# Patient Record
Sex: Male | Born: 1940 | Race: White | Hispanic: No | Marital: Married | Smoking: Never smoker
Health system: Southern US, Community
[De-identification: ages and names within clinical notes are randomized; demographics above are authoritative.]

## PROBLEM LIST (undated history)

## (undated) DIAGNOSIS — I10 Essential (primary) hypertension: Secondary | ICD-10-CM

## (undated) DIAGNOSIS — N433 Hydrocele, unspecified: Secondary | ICD-10-CM

## (undated) DIAGNOSIS — I200 Unstable angina: Secondary | ICD-10-CM

## (undated) DIAGNOSIS — R739 Hyperglycemia, unspecified: Secondary | ICD-10-CM

## (undated) DIAGNOSIS — M109 Gout, unspecified: Secondary | ICD-10-CM

## (undated) DIAGNOSIS — F419 Anxiety disorder, unspecified: Secondary | ICD-10-CM

## (undated) DIAGNOSIS — K2270 Barrett's esophagus without dysplasia: Secondary | ICD-10-CM

## (undated) DIAGNOSIS — K449 Diaphragmatic hernia without obstruction or gangrene: Secondary | ICD-10-CM

## (undated) DIAGNOSIS — R600 Localized edema: Secondary | ICD-10-CM

## (undated) DIAGNOSIS — N529 Male erectile dysfunction, unspecified: Secondary | ICD-10-CM

## (undated) DIAGNOSIS — Z9289 Personal history of other medical treatment: Secondary | ICD-10-CM

## (undated) DIAGNOSIS — K219 Gastro-esophageal reflux disease without esophagitis: Secondary | ICD-10-CM

## (undated) DIAGNOSIS — Z888 Allergy status to other drugs, medicaments and biological substances status: Secondary | ICD-10-CM

## (undated) DIAGNOSIS — Z951 Presence of aortocoronary bypass graft: Secondary | ICD-10-CM

## (undated) DIAGNOSIS — S82892A Other fracture of left lower leg, initial encounter for closed fracture: Secondary | ICD-10-CM

## (undated) DIAGNOSIS — E785 Hyperlipidemia, unspecified: Secondary | ICD-10-CM

## (undated) DIAGNOSIS — I219 Acute myocardial infarction, unspecified: Secondary | ICD-10-CM

## (undated) DIAGNOSIS — I2510 Atherosclerotic heart disease of native coronary artery without angina pectoris: Secondary | ICD-10-CM

## (undated) DIAGNOSIS — I639 Cerebral infarction, unspecified: Secondary | ICD-10-CM

## (undated) DIAGNOSIS — G4733 Obstructive sleep apnea (adult) (pediatric): Secondary | ICD-10-CM

## (undated) DIAGNOSIS — K317 Polyp of stomach and duodenum: Secondary | ICD-10-CM

## (undated) HISTORY — PX: CORONARY ANGIOPLASTY: SHX604

## (undated) HISTORY — DX: Localized edema: R60.0

## (undated) HISTORY — DX: Gout, unspecified: M10.9

## (undated) HISTORY — DX: Male erectile dysfunction, unspecified: N52.9

## (undated) HISTORY — PX: TONSILLECTOMY: SUR1361

## (undated) HISTORY — DX: Polyp of stomach and duodenum: K31.7

## (undated) HISTORY — DX: Obstructive sleep apnea (adult) (pediatric): G47.33

## (undated) HISTORY — DX: Personal history of other medical treatment: Z92.89

## (undated) HISTORY — PX: BACK SURGERY: SHX140

## (undated) HISTORY — DX: Hydrocele, unspecified: N43.3

## (undated) HISTORY — DX: Diaphragmatic hernia without obstruction or gangrene: K44.9

## (undated) HISTORY — PX: COLONOSCOPY: SHX174

---

## 1997-10-10 HISTORY — PX: OTHER SURGICAL HISTORY: SHX169

## 2002-12-05 ENCOUNTER — Ambulatory Visit (HOSPITAL_COMMUNITY): Admission: RE | Admit: 2002-12-05 | Discharge: 2002-12-05 | Payer: Self-pay | Admitting: Gastroenterology

## 2004-10-27 ENCOUNTER — Ambulatory Visit (HOSPITAL_COMMUNITY): Admission: RE | Admit: 2004-10-27 | Discharge: 2004-10-27 | Payer: Self-pay | Admitting: Gastroenterology

## 2007-10-11 DIAGNOSIS — S82892A Other fracture of left lower leg, initial encounter for closed fracture: Secondary | ICD-10-CM

## 2007-10-11 HISTORY — DX: Other fracture of left lower leg, initial encounter for closed fracture: S82.892A

## 2013-07-13 ENCOUNTER — Encounter (HOSPITAL_COMMUNITY)
Admission: EM | Disposition: A | Discharge status: Home / Self Care | Payer: Medicare Other | Source: Home / Self Care | Attending: Interventional Cardiology

## 2013-07-13 ENCOUNTER — Inpatient Hospital Stay (HOSPITAL_COMMUNITY)
Admission: EM | Admit: 2013-07-13 | Discharge: 2013-07-16 | DRG: 247 | Disposition: A | Discharge status: Home / Self Care | Payer: Medicare Other | Attending: Interventional Cardiology | Admitting: Interventional Cardiology

## 2013-07-13 ENCOUNTER — Emergency Department (HOSPITAL_COMMUNITY): Payer: Medicare Other

## 2013-07-13 ENCOUNTER — Encounter (HOSPITAL_COMMUNITY): Payer: Self-pay | Admitting: *Deleted

## 2013-07-13 DIAGNOSIS — R7309 Other abnormal glucose: Secondary | ICD-10-CM | POA: Diagnosis present

## 2013-07-13 DIAGNOSIS — I2119 ST elevation (STEMI) myocardial infarction involving other coronary artery of inferior wall: Principal | ICD-10-CM | POA: Diagnosis present

## 2013-07-13 DIAGNOSIS — I440 Atrioventricular block, first degree: Secondary | ICD-10-CM | POA: Diagnosis present

## 2013-07-13 DIAGNOSIS — I252 Old myocardial infarction: Secondary | ICD-10-CM | POA: Diagnosis present

## 2013-07-13 DIAGNOSIS — E785 Hyperlipidemia, unspecified: Secondary | ICD-10-CM

## 2013-07-13 DIAGNOSIS — Z955 Presence of coronary angioplasty implant and graft: Secondary | ICD-10-CM

## 2013-07-13 DIAGNOSIS — I2582 Chronic total occlusion of coronary artery: Secondary | ICD-10-CM | POA: Diagnosis present

## 2013-07-13 DIAGNOSIS — R739 Hyperglycemia, unspecified: Secondary | ICD-10-CM

## 2013-07-13 DIAGNOSIS — I2510 Atherosclerotic heart disease of native coronary artery without angina pectoris: Secondary | ICD-10-CM

## 2013-07-13 DIAGNOSIS — Z8249 Family history of ischemic heart disease and other diseases of the circulatory system: Secondary | ICD-10-CM

## 2013-07-13 DIAGNOSIS — K2270 Barrett's esophagus without dysplasia: Secondary | ICD-10-CM | POA: Diagnosis present

## 2013-07-13 DIAGNOSIS — I10 Essential (primary) hypertension: Secondary | ICD-10-CM | POA: Diagnosis present

## 2013-07-13 DIAGNOSIS — Z79899 Other long term (current) drug therapy: Secondary | ICD-10-CM

## 2013-07-13 DIAGNOSIS — Z7902 Long term (current) use of antithrombotics/antiplatelets: Secondary | ICD-10-CM

## 2013-07-13 DIAGNOSIS — I214 Non-ST elevation (NSTEMI) myocardial infarction: Secondary | ICD-10-CM

## 2013-07-13 DIAGNOSIS — K219 Gastro-esophageal reflux disease without esophagitis: Secondary | ICD-10-CM | POA: Diagnosis present

## 2013-07-13 DIAGNOSIS — Z886 Allergy status to analgesic agent status: Secondary | ICD-10-CM

## 2013-07-13 DIAGNOSIS — Z888 Allergy status to other drugs, medicaments and biological substances status: Secondary | ICD-10-CM

## 2013-07-13 HISTORY — PX: PERCUTANEOUS CORONARY STENT INTERVENTION (PCI-S): SHX5485

## 2013-07-13 HISTORY — DX: Gastro-esophageal reflux disease without esophagitis: K21.9

## 2013-07-13 HISTORY — PX: LEFT HEART CATHETERIZATION WITH CORONARY ANGIOGRAM: SHX5451

## 2013-07-13 HISTORY — DX: Hyperlipidemia, unspecified: E78.5

## 2013-07-13 HISTORY — DX: Atherosclerotic heart disease of native coronary artery without angina pectoris: I25.10

## 2013-07-13 HISTORY — DX: Essential (primary) hypertension: I10

## 2013-07-13 HISTORY — DX: Barrett's esophagus without dysplasia: K22.70

## 2013-07-13 HISTORY — DX: Old myocardial infarction: I25.2

## 2013-07-13 HISTORY — DX: Hyperglycemia, unspecified: R73.9

## 2013-07-13 HISTORY — DX: Allergy status to other drugs, medicaments and biological substances: Z88.8

## 2013-07-13 LAB — CBC
HCT: 41.6 % (ref 39.0–52.0)
Hemoglobin: 15.2 g/dL (ref 13.0–17.0)
MCH: 34.8 pg — ABNORMAL HIGH (ref 26.0–34.0)
MCHC: 36.5 g/dL — ABNORMAL HIGH (ref 30.0–36.0)
MCV: 95.2 fL (ref 78.0–100.0)
RDW: 12.7 % (ref 11.5–15.5)

## 2013-07-13 LAB — BASIC METABOLIC PANEL
BUN: 12 mg/dL (ref 6–23)
CO2: 27 mEq/L (ref 19–32)
Calcium: 9.1 mg/dL (ref 8.4–10.5)
GFR calc non Af Amer: 88 mL/min — ABNORMAL LOW (ref >90)
Glucose, Bld: 143 mg/dL — ABNORMAL HIGH (ref 70–99)
Sodium: 138 mEq/L (ref 135–145)

## 2013-07-13 LAB — APTT: aPTT: 29 seconds (ref 24–37)

## 2013-07-13 LAB — TROPONIN I
Troponin I: 0.86 ng/mL (ref <0.30)
Troponin I: 11.8 ng/mL (ref <0.30)

## 2013-07-13 LAB — MRSA PCR SCREENING: MRSA by PCR: NEGATIVE

## 2013-07-13 LAB — PROTIME-INR: Prothrombin Time: 13.1 seconds (ref 11.6–15.2)

## 2013-07-13 LAB — GLUCOSE, CAPILLARY: Glucose-Capillary: 117 mg/dL — ABNORMAL HIGH (ref 70–99)

## 2013-07-13 SURGERY — LEFT HEART CATHETERIZATION WITH CORONARY ANGIOGRAM
Anesthesia: LOCAL

## 2013-07-13 MED ORDER — TICAGRELOR 90 MG PO TABS
90.0000 mg | ORAL_TABLET | Freq: Two times a day (BID) | ORAL | Status: DC
  Administered 2013-07-14 – 2013-07-16 (×5): 90 mg via ORAL
  Filled 2013-07-13 (×7): qty 1

## 2013-07-13 MED ORDER — QUINAPRIL HCL 10 MG PO TABS
40.0000 mg | ORAL_TABLET | Freq: Every evening | ORAL | Status: DC
Start: 2013-07-13 — End: 2013-07-16
  Administered 2013-07-14 – 2013-07-15 (×2): 40 mg via ORAL
  Filled 2013-07-13 (×5): qty 4

## 2013-07-13 MED ORDER — BIVALIRUDIN 250 MG IV SOLR
0.2500 mg/kg/h | INTRAVENOUS | Status: DC
  Administered 2013-07-13: 0.25 mg/kg/h via INTRAVENOUS
  Filled 2013-07-13: qty 250

## 2013-07-13 MED ORDER — NITROGLYCERIN 0.4 MG SL SUBL
0.4000 mg | SUBLINGUAL_TABLET | SUBLINGUAL | Status: DC | PRN
  Administered 2013-07-13: 0.4 mg via SUBLINGUAL

## 2013-07-13 MED ORDER — NITROGLYCERIN 0.4 MG SL SUBL
0.4000 mg | SUBLINGUAL_TABLET | SUBLINGUAL | Status: AC | PRN
Start: 2013-07-13 — End: 2013-07-13
  Administered 2013-07-13 (×3): 0.4 mg via SUBLINGUAL
  Filled 2013-07-13: qty 25

## 2013-07-13 MED ORDER — ATORVASTATIN CALCIUM 80 MG PO TABS
80.0000 mg | ORAL_TABLET | Freq: Every day | ORAL | Status: DC
  Administered 2013-07-14 – 2013-07-15 (×2): 80 mg via ORAL
  Filled 2013-07-13 (×4): qty 1

## 2013-07-13 MED ORDER — SODIUM CHLORIDE 0.9 % IV SOLN
0.2500 mg/kg/h | INTRAVENOUS | Status: DC
  Filled 2013-07-13: qty 250

## 2013-07-13 MED ORDER — ATORVASTATIN CALCIUM 80 MG PO TABS
80.0000 mg | ORAL_TABLET | Freq: Every day | ORAL | Status: DC
  Filled 2013-07-13 (×4): qty 1

## 2013-07-13 MED ORDER — FENTANYL CITRATE 0.05 MG/ML IJ SOLN
INTRAMUSCULAR | Status: AC
  Filled 2013-07-13: qty 2

## 2013-07-13 MED ORDER — CLOPIDOGREL BISULFATE 300 MG PO TABS: 300.0000 mg | ORAL_TABLET | Freq: Once | ORAL | Status: DC

## 2013-07-13 MED ORDER — HEPARIN SODIUM (PORCINE) 5000 UNIT/ML IJ SOLN
5000.0000 [IU] | Freq: Three times a day (TID) | INTRAMUSCULAR | Status: DC
  Filled 2013-07-13: qty 1

## 2013-07-13 MED ORDER — HEPARIN (PORCINE) IN NACL 100-0.45 UNIT/ML-% IJ SOLN
1000.0000 [IU]/h | INTRAMUSCULAR | Status: DC
  Administered 2013-07-13: 1000 [IU]/h via INTRAVENOUS
  Filled 2013-07-13: qty 250

## 2013-07-13 MED ORDER — NITROGLYCERIN 0.2 MG/ML ON CALL CATH LAB
INTRAVENOUS | Status: AC
  Filled 2013-07-13: qty 1

## 2013-07-13 MED ORDER — ACETAMINOPHEN 325 MG PO TABS: 650.0000 mg | ORAL_TABLET | ORAL | Status: DC | PRN

## 2013-07-13 MED ORDER — BIVALIRUDIN 250 MG IV SOLR
INTRAVENOUS | Status: AC
  Filled 2013-07-13: qty 250

## 2013-07-13 MED ORDER — ONDANSETRON HCL 4 MG/2ML IJ SOLN: 4.0000 mg | Freq: Four times a day (QID) | INTRAMUSCULAR | Status: DC | PRN

## 2013-07-13 MED ORDER — OXYCODONE-ACETAMINOPHEN 5-325 MG PO TABS: 1.0000 | ORAL_TABLET | ORAL | Status: DC | PRN

## 2013-07-13 MED ORDER — TICAGRELOR 90 MG PO TABS
ORAL_TABLET | ORAL | Status: AC
  Filled 2013-07-13: qty 2

## 2013-07-13 MED ORDER — PNEUMOCOCCAL VAC POLYVALENT 25 MCG/0.5ML IJ INJ
0.5000 mL | INJECTION | INTRAMUSCULAR | Status: DC
  Filled 2013-07-13: qty 0.5

## 2013-07-13 MED ORDER — CLOPIDOGREL BISULFATE 300 MG PO TABS
300.0000 mg | ORAL_TABLET | Freq: Once | ORAL | Status: AC
  Administered 2013-07-13: 300 mg via ORAL
  Filled 2013-07-13: qty 1

## 2013-07-13 MED ORDER — HEPARIN (PORCINE) IN NACL 2-0.9 UNIT/ML-% IJ SOLN
INTRAMUSCULAR | Status: AC
  Filled 2013-07-13: qty 1000

## 2013-07-13 MED ORDER — ALPRAZOLAM 0.25 MG PO TABS
0.2500 mg | ORAL_TABLET | Freq: Three times a day (TID) | ORAL | Status: DC | PRN
  Administered 2013-07-13 – 2013-07-15 (×3): 0.25 mg via ORAL
  Filled 2013-07-13 (×3): qty 1

## 2013-07-13 MED ORDER — SODIUM CHLORIDE 0.9 % IV BOLUS (SEPSIS)
1000.0000 mL | Freq: Once | INTRAVENOUS | Status: AC
  Administered 2013-07-13: 1000 mL via INTRAVENOUS

## 2013-07-13 MED ORDER — MIDAZOLAM HCL 2 MG/2ML IJ SOLN
INTRAMUSCULAR | Status: AC
  Filled 2013-07-13: qty 2

## 2013-07-13 MED ORDER — LIDOCAINE HCL (PF) 1 % IJ SOLN
INTRAMUSCULAR | Status: AC
  Filled 2013-07-13: qty 30

## 2013-07-13 MED ORDER — HEPARIN SODIUM (PORCINE) 5000 UNIT/ML IJ SOLN
4000.0000 [IU] | Freq: Once | INTRAMUSCULAR | Status: AC
  Administered 2013-07-13: 4000 [IU] via INTRAVENOUS
  Filled 2013-07-13: qty 1

## 2013-07-13 MED ORDER — CLOPIDOGREL BISULFATE 75 MG PO TABS: 75.0000 mg | ORAL_TABLET | Freq: Every day | ORAL | Status: DC

## 2013-07-13 MED ORDER — SODIUM CHLORIDE 0.9 % IV SOLN: 0.2500 mg/kg/h | INTRAVENOUS | Status: AC

## 2013-07-13 MED ORDER — METOPROLOL TARTRATE 25 MG PO TABS
25.0000 mg | ORAL_TABLET | Freq: Two times a day (BID) | ORAL | Status: DC
  Administered 2013-07-13 – 2013-07-16 (×6): 25 mg via ORAL
  Filled 2013-07-13 (×7): qty 1

## 2013-07-13 MED ORDER — INFLUENZA VAC SPLIT QUAD 0.5 ML IM SUSP
0.5000 mL | INTRAMUSCULAR | Status: DC
  Filled 2013-07-13: qty 0.5

## 2013-07-13 MED ORDER — PANTOPRAZOLE SODIUM 40 MG PO TBEC
40.0000 mg | DELAYED_RELEASE_TABLET | Freq: Every day | ORAL | Status: DC
  Administered 2013-07-14 – 2013-07-16 (×3): 40 mg via ORAL
  Filled 2013-07-13 (×4): qty 1

## 2013-07-13 MED ORDER — SODIUM CHLORIDE 0.9 % IV SOLN
1.0000 mL/kg/h | INTRAVENOUS | Status: DC
  Administered 2013-07-13: 1 mL/kg/h via INTRAVENOUS

## 2013-07-13 NOTE — H&P (Signed)
Physician History and Physical      Patient ID: Matthew Liu MRN: [id number] DOB/AGE: Aug 13, 1941 72 y.o. Admit date: 07/13/2013  Primary Care Physician:  Primary Cardiologist New  HPI: Matthew Liu is a 72 year old white male who presents to the emergency department today with complaints of chest pain. He has a history of hypertension. Today at approximately 12 noon he developed substernal chest pain while drinking a cup of coffee at [employer]. He states the pain started in his arms bilaterally. It radiated up his arms into his shoulders and chest and jaw. He denied any diaphoresis, nausea, or shortness of breath. The pain was worse when he tries to exert himself. He did somewhat waxed and waned throughout the afternoon. He tried walking to his daughter's house and when he got there he was exhausted and continued to have chest discomfort. He then presented to the emergency room. In the emergency room he was treated with sublingual nitroglycerin x3 with partial relief of his chest pain going from 8/10-3/10. He has no prior cardiac history. 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   Review of systems complete and found to be negative unless listed above  Past Medical History  Diagnosis Date  . Hypertension     Family History  Problem Relation Age of Onset  . Hypertension Mother     History   Social History  . Marital Status: Married    Spouse Name: N/A    Number of Children: 2  . Years of Education: N/A   Occupational History  . Department of Corporate investment banker    Social History Main Topics  . Smoking status: Never Smoker   . Smokeless tobacco: Not on file  . Alcohol Use: Yes  . Drug Use: No  . Sexual Activity: Not on file   Other Topics Concern  . Not on file   Social History Narrative  . No narrative on file    Past Surgical History  Procedure Laterality Date  . Left ankle surgery       Prescriptions prior to admission  Medication Sig  Dispense Refill  . Multiple Vitamins-Minerals (SENIOR MULTIVITAMIN PLUS PO) Take 1 tablet by mouth daily.      [name] omeprazole (PRILOSEC) 40 MG capsule Take 40 mg by mouth every morning.      . quinapril (ACCUPRIL) 40 MG tablet Take 40 mg by mouth every evening.         Physical Exam: Blood pressure 143/80, pulse 100, temperature 98.3 F (36.8 C), temperature source Oral, resp. rate 28, weight 165 lb (74.844 kg), SpO2 100.00%.  He is a pleasant white male in mild distress. HEENT: Normocephalic, atraumatic. Pupils equal round and reactive to light accommodation. Extraocular movements are full. Oropharynx is clear. Neck is without jugular venous distention, adenopathy, thyromegaly, or bruits. Lungs: Clear Cardiovascular: Regular rate and rhythm. Normal S1 and S2. No gallop, murmur, or click. Abdomen: Soft and nontender. No plasma megaly. Bowel sounds are positive. Extremities: No cyanosis or edema. Pulses are 2+ and symmetric throughout. Skin: Warm and dry. Neuro: Alert and oriented x3. Cranial nerves II through XII are intact.  Labs:   Lab Results  Component Value Date   WBC 7.2 07/13/2013   HGB 15.2 07/13/2013   HCT 41.6 07/13/2013   MCV 95.2 07/13/2013   PLT 234 07/13/2013     Recent Labs Lab 07/13/13 1556  NA 138  K 4.1  CL 101  CO2 27  BUN 12  CREATININE  0.77  CALCIUM 9.1  GLUCOSE 143*   Lab Results  Component Value Date   TROPONINI 0.86* 07/13/2013    No results found for this basename: CHOL   No results found for this basename: HDL   No results found for this basename: LDLCALC   No results found for this basename: TRIG   No results found for this basename: CHOLHDL   No results found for this basename: LDLDIRECT      Radiology:PORTABLE CHEST - 1 VIEW  COMPARISON: None.  FINDINGS: The heart size and mediastinal contours are within normal limits. Both lungs are clear. The visualized skeletal structures are unremarkable.  IMPRESSION: No active  disease.   Electronically Signed By: Irish Lack M.D. On: 07/13/2013 16:44   EKG: Initial ECG at 1550 shows normal sinus rhythm with 1 mm of ST elevation in lead 2, 3, and aVF. There is a Q wave in lead 3. Repeat ECG at 1700 is unchanged.  ASSESSMENT AND PLAN:  1. Acute inferior STEMI. Patient has ongoing chest pain. Given his aspirin allergy he was treated with Plavix 300 mg orally. He is given a bolus of heparin. STEMI protocol is activated. Recommend emergent cardiac catheterization with possible PCI. 2. Hypertension. 3. GERD. 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  SignedTheron Arista [facility] 07/13/2013, 6:18 PM

## 2013-07-13 NOTE — ED Notes (Signed)
Pt is here with mid chest pain , arm pain, neck, and back pain for a couple of days.  Pt states pain increases with exertion.  No sob.nausea

## 2013-07-13 NOTE — Progress Notes (Signed)
Chaplain was paged to ed for a code stemi.  Pt seemed alert and responsive when chaplain entered the room.  Chaplain prayed with the family and was informed that chaplain services would not be needed from that point forward.  Chaplain ended the visit and will be available if needed or requested.    07/13/13 1715  Clinical Encounter Type  Visited With Patient and family together  Visit Type Initial;Spiritual support;ED  Referral From Nurse  Consult/Referral To Chaplain  Spiritual Encounters  Spiritual Needs Emotional;Prayer  Stress Factors  Patient Stress Factors None identified  Family Stress Factors None identified  [facility]  Sheral Apley   [phone]

## 2013-07-13 NOTE — ED Provider Notes (Signed)
CSN: [id number]     Arrival date & time 07/13/13  1548 History   First MD Initiated Contact with Patient 07/13/13 1554     Chief Complaint  Patient presents with  . Chest Pain   (Consider location/radiation/quality/duration/timing/severity/associated sxs/prior Treatment) Patient is a 72 y.o. male presenting with chest pain. The history is provided by the patient.  Chest Pain Pain location:  Substernal area Pain quality: pressure   Pain radiates to:  L jaw, R jaw, L shoulder, R arm and L arm Pain severity:  Severe Onset quality:  Gradual Duration:  3 days Timing:  Intermittent Progression:  Waxing and waning Chronicity:  New Relieved by:  Rest Worsened by:  Exertion Ineffective treatments:  Rest Associated symptoms: shortness of breath   Associated symptoms: no abdominal pain, no back pain, no cough, no fever, no heartburn, no nausea, no numbness and not vomiting   Risk factors: hypertension   Risk factors: no smoking     Past Medical History  Diagnosis Date  . Hypertension    Past Surgical History  Procedure Laterality Date  . Left ankle surgery     Family History  Problem Relation Age of Onset  . Hypertension Mother    History  Substance Use Topics  . Smoking status: Never Smoker   . Smokeless tobacco: Not on file  . Alcohol Use: Yes    Review of Systems  Constitutional: Negative for fever and activity change.  Respiratory: Positive for chest tightness and shortness of breath. Negative for cough.   Cardiovascular: Positive for chest pain.  Gastrointestinal: Negative for heartburn, nausea, vomiting, abdominal pain, diarrhea and constipation.  Musculoskeletal: Negative for myalgias, back pain and arthralgias.  Skin: Negative for color change, pallor, rash and wound.  Neurological: Negative for numbness.  All other systems reviewed and are negative.    Allergies  Aspirin and Quinine derivatives  Home Medications   No current outpatient prescriptions on  file. BP 135/71  Pulse 52  Temp(Src) 98.1 F (36.7 C) (Oral)  Resp 23  Ht 5\' 9"  (1.753 m)  Wt 168 lb 6.9 oz (76.4 kg)  BMI 24.86 kg/m2  SpO2 99% Physical Exam  Nursing note and vitals reviewed. Constitutional: He appears well-developed and well-nourished. He appears distressed (appears in obvious discomfort).  HENT:  Head: Normocephalic.  Mouth/Throat: No oropharyngeal exudate.  Eyes: Conjunctivae and EOM are normal. Pupils are equal, round, and reactive to light.  Neck: Normal range of motion. Neck supple.  Cardiovascular: Regular rhythm and normal heart sounds.  Tachycardia present.  Exam reveals no gallop and no friction rub.   No murmur heard. Pulmonary/Chest: Effort normal and breath sounds normal. No respiratory distress. He has no wheezes. He has no rales.  Abdominal: Soft. He exhibits no distension. There is no tenderness.  Musculoskeletal: Normal range of motion. He exhibits no edema and no tenderness.  Lymphadenopathy:    He has no cervical adenopathy.  Skin: Skin is warm and dry. No rash noted. He is not diaphoretic.  Psychiatric: He has a normal mood and affect. His behavior is normal. Judgment and thought content normal.    ED Course  Procedures (including critical care time) Labs Review Labs Reviewed  CBC - Abnormal; Notable for the following:    MCH 34.8 (*)    MCHC 36.5 (*)    All other components within normal limits  BASIC METABOLIC PANEL - Abnormal; Notable for the following:    Glucose, Bld 143 (*)    GFR calc non Af  Amer 88 (*)    All other components within normal limits  TROPONIN I - Abnormal; Notable for the following:    Troponin I 0.86 (*)    All other components within normal limits  TROPONIN I - Abnormal; Notable for the following:    Troponin I 11.80 (*)    All other components within normal limits  GLUCOSE, CAPILLARY - Abnormal; Notable for the following:    Glucose-Capillary 117 (*)    All other components within normal limits  MRSA PCR  SCREENING  PROTIME-INR  APTT  CBC  BASIC METABOLIC PANEL  TROPONIN I  TROPONIN I  HEMOGLOBIN A1C  TSH  LIPID PANEL   Imaging Review Dg Chest Portable 1 View  07/13/2013   CLINICAL DATA:  Chest pain.  EXAM: PORTABLE CHEST - 1 VIEW  COMPARISON:  None.  FINDINGS: The heart size and mediastinal contours are within normal limits. Both lungs are clear. The visualized skeletal structures are unremarkable.  IMPRESSION: No active disease.   Electronically Signed   By: Irish Lack M.D.   On: 07/13/2013 16:44    MDM   1. NSTEMI (non-ST elevated myocardial infarction)   2. ST elevation myocardial infarction (STEMI) of inferior wall     The patient is a 72 year old male with a history of hypertension who presents with chest pain. The patient has had 3 or 4 anginal episodes over the last 2-3 days. Most of these episodes occur with exertion, however he has also noted pain in his arms, neck, shortness of breath. The patient has a history of hypertension he is managed on ACE inhibitor. He presents today due to persistent chest pain has been constant for the last 2-3 hours. He rates it a 7/10 on the pain scale. Describes it as a substernal pressure. Arrival he is hypertensive to 190s systolic.   Date: 07/13/2013  Rate: 105  Rhythm: sinus tachycardia  QRS Axis: normal  Intervals: PR prolonged  ST/T Wave abnormalities: nonspecific ST changes  Conduction Disutrbances:first-degree A-V block   Narrative Interpretation:   Old EKG Reviewed: none available  Concern in this high-risk patient for ACS. Hypertensive emergency also on differential. EKG obtained in triage shows nonspecific ST changes as documented above. Initial i-STAT troponin returned elevated at 0.39. Patient meets criteria for an non-ST elevation MI. He has an aspirin allergy which she describes as anaphylaxis, so we'll give 300 of Plavix for antiplatelet if cardiology is in agreement. We'll also give sublingual nitroglycerin followed by  nitroglycerin infusion should chest pain not resolved. Chest x-ray, formal troponin, basic labs sent.  CXR shows no consolidation, effusion, pneumothorax, or widened mediastinum. Pain improved with sublingual nitro but pressures dropped from 190s down to 100 systolic. Will give fluid bolus and more nitro to get chest pain free. Discussed with Cards and will give plavix due to Asprin allergy as well as heparin bolus and infusion.  Repeat EKG shows resolving non-diagnostic ST changes in III and aVf. Cardiology wants to take to cath lab emergently.   Date: 07/13/2013  Rate: 86  Rhythm: normal sinus rhythm  QRS Axis: normal  Intervals: PR prolonged  ST/T Wave abnormalities: nonspecific ST changes  Conduction Disutrbances:first-degree A-V block   Narrative Interpretation:   Old EKG Reviewed: changes noted, non-diagnostic ST elevations resolving  Patient was discussed with my attending, Dr. Redgie Grayer.  Dorna Leitz, MD 07/14/13 Marlyne Beards

## 2013-07-13 NOTE — Progress Notes (Signed)
ANTICOAGULATION CONSULT NOTE - Initial Consult  Pharmacy Consult for Heparin  Indication: chest pain/ACS  Allergies  Allergen Reactions  . Aspirin Hives    "Shortness of Breath" can't breathe  . Quinine Derivatives Hives    "look like he has the measels"    Patient Measurements: Ht: 67ft 9.5in Weight: 165 lb (74.844 kg).  Vital Signs: Temp: 98.3 F (36.8 C) (10/04 1646) Temp src: Oral (10/04 1646) BP: 139/70 mmHg (10/04 1700) Pulse Rate: 95 (10/04 1700)  Labs:  Recent Labs  07/13/13 1556  HGB 15.2  HCT 41.6  PLT 234  CREATININE 0.77    CrCl is unknown because there is no height on file for the current visit.   Medical History: Past Medical History  Diagnosis Date  . Hypertension    Assessment: 72 y/o M here with CP to start heparin. Pt already got 4000 units heparin BOLUS and 300 mg Plavix (ASA allergic). CBC good, renal function good, no bleeding problems per pt.   Goal of Therapy:  Heparin level 0.3-0.7 units/ml Monitor platelets by anticoagulation protocol: Yes   Plan:  -Heparin 4000 units bolus already given -Start heparin drip at 1000 units/hr -8 hour HL at 0200 -Daily CBC/HL -Monitor for bleeding -F/U aPTT, INR  Thank you for allowing me to take part in this patient's care,  Abran Duke, PharmD Clinical Pharmacist Phone: [phone] Pager: [phone] 07/13/2013 5:23 PM

## 2013-07-13 NOTE — CV Procedure (Addendum)
Emergency Cardiac Catheterization with Coronary Angiography and PCI Report  Matthew Liu  72 y.o.  male [birth year]  Procedure Date: 07/13/2013 Referring Physician: [facility] ED Primary Cardiologist:: HWBSmith, III, MD   PROCEDURE:  Left heart catheterization with selective coronary angiography(performermed by Dr. Peter Swaziland), left ventriculogram, and coronary PCI (HWBSmith)  INDICATIONS:  Acute inferolateral ST elevation MI.  The risks, benefits, and details of the procedure were explained to the patient.  The patient verbalized understanding and wanted to proceed.  Informed written consent was obtained.  PROCEDURE TECHNIQUE:  Dr. Swaziland started the case and performed. Coronary angiography. After Xylocaine anesthesia a 5 French Slender radial sheath was placed in the right radial artery with a single anterior needle wall stick. Coronary angiography was done using a 5 [place] JR 4 and JL 3.5 cm diagnostic catheters.  Left ventriculography was done using hand injection via the JR 4 guide catheter. Left ventriculography was performed at the completion of the case.  Dr. Swaziland and I briefly discussed the patient's anatomy and felt that the right coronary was likely the culprit. The diagnostic angiograms appeared to suggest a flush occlusion with the aorta and other features compatible with chronic occlusion. Despite this, I decided to attempt recanalization of the right coronary. A 6 [place] JR 4 guide catheter was chosen. Bivalirudin bolus and infusion was started. The patient had been previously loaded with 600 mg of Plavix in the emergency room. He is allergic to aspirin (pulmonary and skin reaction). Luckily, the right coronary proximal segment was heavily calcified. The calcium helped to achieve coaxial alignment. I then probed the occlusion with the BMW wire and with very little difficulty we were able to cross the ostial stenosis and gain access to the distal vessel. We then performed  angioplasty in the proximal and ostial segment initially with a 2.5 mm Trek balloon. This was followed by a 3.0 x 15 mm balloon. Four balloon inflations were performed. I then placed a Pro-water buddy wire distal in the right coronary. We positioned and deployed a Promus Premier 3.5 x 28 mm long stent in the midsegment deployed at 15 atmospheres for 2 inflations. The buddy wire was removed prior to stent deployment. The buddy wire was then re\re positioned distal and a second Promus Premier 3.5 x 28 mm stent was positioned overlapping the previously placed stent and extending just outside the ostium of the right coronary. This stent was also deployed to 15 atmospheres x2 inflations. Postdilatation was then performed in 3 overlapping balloon inflations using a 20 mm long by 3.75 mm Ogilvie Quantum Apex to 14 atmospheres in each location. TIMI grade 3 flow was reestablished. The distal territory is large. A nice distal blush was noted.   CONTRAST:  Total of 235 cc.  COMPLICATIONS:  None.    HEMODYNAMICS:  Aortic pressure was 135/76 mmHg; LV pressure was 135/9 mmHg; LVEDP 15 mm mercury.  There was no gradient between the left ventricle and aorta.    ANGIOGRAPHIC DATA:   The left main coronary artery is relatively short with distal 30% narrowing. Moderate calcification is noted..  The left anterior descending artery is transapical. There is an eccentric 40-50% proximal narrowing..  The left circumflex artery is moderate in size giving to obtuse marginal branches. There is 50% eccentric calcified proximal to ostial narrowing. The mid vessel net origin of the first obtuse marginal contains a Medina 0, with one, one bifurcation stenosis with the continuation of the circumflex being  most severely involved at 99%. The first marginal contains 80% obstruction.  The right coronary artery is heavily calcified. In the ostium and proximal segment and totally occluded at the aorto ostial junction. The distal vessel was  seen to fill by left to right collaterals.  CORONARY PERCUTANEOUS INTERVENTION: The totally occluded right coronary was recanalized with angioplasty followed by stenting with 0% residual stenosis and TIMI 3 flow. Myocardial blush was present, suggesting microcirculatory function.  LEFT VENTRICULOGRAM:  Left ventricular angiogram was done in the 30 RAO projection and revealed inferobasal severe hypokinesis. The ejection fraction is 55%.   IMPRESSIONS:  1. Acute inferior myocardial infarction due to occlusion of the right coronary.  2. Successful PTCA and stenting of a totally occluded ostial stenosis to 0% with TIMI grade 3 flow. The proximal and mid vessel was also severely diseased requiring that a long segment of stent be placed (56 mm overlapping).  3. Aspirin allergy.  4. Severe circumflex obstructive disease, with a mid vessel Medina, 0/1/1 bifurcation lesion.  5. Moderate ostial LAD disease.  6. Inferior regional wall motion abnormality, but overall preserved LV function. LVEF 55%   RECOMMENDATION:  Brilinta 90mg  BID  Bivalirudin 0.25 mg per kilogram per hour infusion x6 hours.  Circumflex PCI at a later date, after we're certain that the patient will not have stent thrombosis on single antiplatelet therapy. The symptoms, however, couldn't force our hand.

## 2013-07-14 DIAGNOSIS — I10 Essential (primary) hypertension: Secondary | ICD-10-CM

## 2013-07-14 DIAGNOSIS — K219 Gastro-esophageal reflux disease without esophagitis: Secondary | ICD-10-CM

## 2013-07-14 DIAGNOSIS — I2119 ST elevation (STEMI) myocardial infarction involving other coronary artery of inferior wall: Secondary | ICD-10-CM

## 2013-07-14 LAB — GLUCOSE, CAPILLARY
Glucose-Capillary: 106 mg/dL — ABNORMAL HIGH (ref 70–99)
Glucose-Capillary: 94 mg/dL (ref 70–99)

## 2013-07-14 LAB — CBC
HCT: 37.6 % — ABNORMAL LOW (ref 39.0–52.0)
Hemoglobin: 13.1 g/dL (ref 13.0–17.0)
MCH: 33.3 pg (ref 26.0–34.0)
MCHC: 34.8 g/dL (ref 30.0–36.0)
MCV: 95.7 fL (ref 78.0–100.0)
Platelets: 191 10*3/uL (ref 150–400)
RBC: 3.93 MIL/uL — ABNORMAL LOW (ref 4.22–5.81)
WBC: 6.4 10*3/uL (ref 4.0–10.5)

## 2013-07-14 LAB — TSH: TSH: 0.573 u[IU]/mL (ref 0.350–4.500)

## 2013-07-14 LAB — BASIC METABOLIC PANEL
BUN: 10 mg/dL (ref 6–23)
CO2: 23 mEq/L (ref 19–32)
Calcium: 8.2 mg/dL — ABNORMAL LOW (ref 8.4–10.5)
Chloride: 105 mEq/L (ref 96–112)
Creatinine, Ser: 0.63 mg/dL (ref 0.50–1.35)
Glucose, Bld: 131 mg/dL — ABNORMAL HIGH (ref 70–99)
Sodium: 139 mEq/L (ref 135–145)

## 2013-07-14 LAB — HEMOGLOBIN A1C: Mean Plasma Glucose: 120 mg/dL — ABNORMAL HIGH (ref <117)

## 2013-07-14 LAB — TROPONIN I
Troponin I: >20 ng/mL (ref <0.30)
Troponin I: 10.86 ng/mL (ref <0.30)

## 2013-07-14 LAB — LIPID PANEL
HDL: 38 mg/dL — ABNORMAL LOW (ref >39)
LDL Cholesterol: 94 mg/dL (ref 0–99)
Triglycerides: 105 mg/dL (ref <150)

## 2013-07-14 LAB — POCT I-STAT TROPONIN I: Troponin i, poc: 0.39 ng/mL (ref 0.00–0.08)

## 2013-07-14 MED ORDER — PNEUMOCOCCAL VAC POLYVALENT 25 MCG/0.5ML IJ INJ: 0.5000 mL | INJECTION | INTRAMUSCULAR | Status: DC | PRN

## 2013-07-14 MED ORDER — INFLUENZA VAC SPLIT QUAD 0.5 ML IM SUSP: 0.5000 mL | INTRAMUSCULAR | Status: DC | PRN

## 2013-07-14 NOTE — ED Provider Notes (Signed)
CRITICAL CARE Performed by: Redgie Grayer, Jerri Glauser   Total critical care time:45  Critical care time was exclusive of separately billable procedures and treating other patients.  Critical care was necessary to treat or prevent imminent or life-threatening deterioration.  Critical care was time spent personally by me on the following activities: development of treatment plan with patient and/or surrogate as well as nursing, discussions with consultants, evaluation of patient's response to treatment, examination of patient, obtaining history from patient or surrogate, ordering and performing treatments and interventions, ordering and review of laboratory studies, ordering and review of radiographic studies, pulse oximetry and re-evaluation of patient's condition.    I saw and evaluated the patient, reviewed the resident's note and I agree with the findings and plan.  I reviewed EKG and agree with findings.    Stat consult to cardiology upon review of EKG and initial i-STAT troponin. Based on patient's history, progressive chest pain, positive troponin and EKG findings decision was made for emergent catheterization. Patient was given Plavix in lieu of aspirin due to his allergy, heparin was given, and the patient was transferred to cath lab in stable condition.   Darlys Gales, MD 07/14/13 [phone]

## 2013-07-14 NOTE — Progress Notes (Signed)
TELEMETRY: Reviewed telemetry pt in NSR: Filed Vitals:   07/14/13 0400 07/14/13 0430 07/14/13 0500 07/14/13 0629  BP: 122/60 106/51 101/54 141/78  Pulse: 51 50 51 57  Temp: 98.3 F (36.8 C)     TempSrc: Oral     Resp: 22 24 23 19   Height:      Weight:      SpO2: 98% 97% 99% 98%    Intake/Output Summary (Last 24 hours) at 07/14/13 0716 Last data filed at 07/14/13 0700  Gross per 24 hour  Intake 1429.9 ml  Output   1025 ml  Net  404.9 ml    SUBJECTIVE Feels well this am. No further chest pain. No SOB.  LABS: Basic Metabolic Panel:  Recent Labs  16/10/96 1556 07/14/13 0215  NA 138 139  K 4.1 4.0  CL 101 105  CO2 27 23  GLUCOSE 143* 131*  BUN 12 10  CREATININE 0.77 0.63  CALCIUM 9.1 8.2*   CBC:  Recent Labs  07/13/13 1556 07/14/13 0215  WBC 7.2 6.4  HGB 15.2 13.1  HCT 41.6 37.6*  MCV 95.2 95.7  PLT 234 191   Cardiac Enzymes:  Recent Labs  07/13/13 1632 07/13/13 2058 07/14/13 0215  TROPONINI 0.86* 11.80* >20.00*   Hemoglobin A1C: No results found for this basename: HGBA1C,  in the last 72 hours Fasting Lipid Panel:  Recent Labs  07/14/13 0215  CHOL 153  HDL 38*  LDLCALC 94  TRIG 045  CHOLHDL 4.0   Thyroid Function Tests: No results found for this basename: TSH, T4TOTAL, FREET3, T3FREE, THYROIDAB,  in the last 72 hours   Radiology/Studies:  Dg Chest Portable 1 View  07/13/2013   CLINICAL DATA:  Chest pain.  EXAM: PORTABLE CHEST - 1 VIEW  COMPARISON:  None.  FINDINGS: The heart size and mediastinal contours are within normal limits. Both lungs are clear. The visualized skeletal structures are unremarkable.  IMPRESSION: No active disease.   Electronically Signed   By: Irish Lack M.D.   On: 07/13/2013 16:44   Ecg: post PCI- resolution of ST elevation inferiorly with Q waves in III and avf. Ecg today is pending.  PHYSICAL EXAM General: Well developed, well nourished, in no acute distress. Head: Normocephalic, atraumatic,  sclera non-icteric, no xanthomas, nares are without discharge. Neck: Negative for carotid bruits. JVD not elevated. Lungs: Clear bilaterally to auscultation without wheezes, rales, or rhonchi. Breathing is unlabored. Heart: RRR S1 S2 without murmurs, rubs, or gallops.  Abdomen: Soft, non-tender, non-distended with normoactive bowel sounds. No hepatomegaly. No rebound/guarding. No obvious abdominal masses. Msk:  Strength and tone appears normal for age. Extremities: No clubbing, cyanosis or edema.  Distal pedal pulses are 2+ and equal bilaterally. No radial hematoma. Neuro: Alert and oriented X 3. Moves all extremities spontaneously. Psych:  Responds to questions appropriately with a normal affect.  ASSESSMENT AND PLAN: 1. Inferior STEMI. S/p DES stents x 2 from ostial to mid RCA. Continue Brilinta only due to ASA allergy. On beta blocker, ACEi, and statin. Advance activity as tolerated. Check Echo. 2. CAD- as noted above. Residual severe disease in OM2. Will treat medically for now. If recurrent pain may require PCI.  3. ASA allergy. 4. HTN 5. Dyslipidemia. 6. GERD  Principal Problem:   ST elevation myocardial infarction (STEMI) of inferior wall Active Problems:   HTN (hypertension)   GERD (gastroesophageal reflux disease)   Barrett's esophagus    Signed, Peter Swaziland MD,FACC 07/14/2013 7:21 AM

## 2013-07-15 LAB — HEPATIC FUNCTION PANEL
AST: 39 U/L — ABNORMAL HIGH (ref 0–37)
Albumin: 3.5 g/dL (ref 3.5–5.2)
Alkaline Phosphatase: 71 U/L (ref 39–117)
Indirect Bilirubin: 0.8 mg/dL (ref 0.3–0.9)
Total Bilirubin: 1 mg/dL (ref 0.3–1.2)
Total Protein: 6.8 g/dL (ref 6.0–8.3)

## 2013-07-15 LAB — POCT ACTIVATED CLOTTING TIME: Activated Clotting Time: 524 seconds

## 2013-07-15 LAB — GLUCOSE, CAPILLARY: Glucose-Capillary: 111 mg/dL — ABNORMAL HIGH (ref 70–99)

## 2013-07-15 NOTE — Progress Notes (Signed)
Patient Name: Matthew Liu Date of Encounter: 07/15/2013    SUBJECTIVE: [name] has no discomfort. He denies chest discomfort, dyspnea, palpitations, and syncope. Medications are not causing side effects. He is allergic to aspirin and is only onBrilinta. He has residual circumflex disease. I have decided to treat this medically until we see how he does on a single antiplatelet agent.  TELEMETRY:  Normal sinus rhythm: Filed Vitals:   07/15/13 0400 07/15/13 0500 07/15/13 0600 07/15/13 0700  BP: 108/49 131/67 112/43 134/98  Pulse:      Temp:  98.9 F (37.2 C)    TempSrc:  Oral    Resp: 20 18 20 18   Height:      Weight:      SpO2:  95%      Intake/Output Summary (Last 24 hours) at 07/15/13 0918 Last data filed at 07/15/13 0500  Gross per 24 hour  Intake      0 ml  Output    500 ml  Net   -500 ml    LABS: Basic Metabolic Panel:  Recent Labs  40/98/11 1556 07/14/13 0215  NA 138 139  K 4.1 4.0  CL 101 105  CO2 27 23  GLUCOSE 143* 131*  BUN 12 10  CREATININE 0.77 0.63  CALCIUM 9.1 8.2*   CBC:  Recent Labs  07/13/13 1556 07/14/13 0215  WBC 7.2 6.4  HGB 15.2 13.1  HCT 41.6 37.6*  MCV 95.2 95.7  PLT 234 191   Cardiac Enzymes:  Recent Labs  07/13/13 2058 07/14/13 0215 07/14/13 0855  TROPONINI 11.80* >20.00* 10.86*   BNP: No components found with this basename: POCBNP,  Hemoglobin A1C:  Recent Labs  07/13/13 2058  HGBA1C 5.8*   Fasting Lipid Panel:  Recent Labs  07/14/13 0215  CHOL 153  HDL 38*  LDLCALC 94  TRIG 914  CHOLHDL 4.0    Radiology/Studies:  No new data  Physical Exam: Blood pressure 134/98, pulse 82, temperature 98.9 F (37.2 C), temperature source Oral, resp. rate 18, height 5\' 9"  (1.753 m), weight 168 lb 6.9 oz (76.4 kg), SpO2 95.00%. Weight change:    Mild JVD is noted.  Chest is clear to auscultation and percussion.  Cardiac exam reveals an S4 gallop.  Extremities no edema.  Right radial cath site is unremarkable    ASSESSMENT:  1. Acute inferior MI treated with the proximal/ostial drug-eluting stent.  2. Residual distal circumflex disease, Medina, bifurcation lesion 0,1,1.  3. Aspirin allergy   Plan:  1. Transferred to telemetry.  2. Circumflex therapy. After we see how the patient does on single antiplatelet therapy .  Selinda Eon 07/15/2013, 9:18 AM

## 2013-07-15 NOTE — Progress Notes (Signed)
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 Good support system.  OBJECTIVE Filed Vitals:   07/15/13 0300 07/15/13 0400 07/15/13 0500 07/15/13 0600  BP: 115/64 108/49 131/67 112/43  Pulse:      Temp:   98.9 F (37.2 C)   TempSrc:   Oral   Resp: 27 20 18 20   Height:      Weight:      SpO2:   95%     Intake/Output Summary (Last 24 hours) at 07/15/13 0644 Last data filed at 07/15/13 0500  Gross per 24 hour  Intake    120 ml  Output    700 ml  Net   -580 ml   Filed Weights   07/13/13 1631 07/13/13 1632 07/13/13 1940  Weight: 152 lb 6 oz (69.117 kg) 165 lb (74.844 kg) 168 lb 6.9 oz (76.4 kg)    PHYSICAL EXAM General: Well developed, well nourished, male in no acute distress. Head: Normocephalic, atraumatic.  Neck: Supple without bruits, JVD not elevated. Lungs:  Resp regular and unlabored, CTA. Heart: RRR, S1, S2, no S3, S4, or murmur; no rub. Abdomen: Soft, non-tender, non-distended, BS + x 4.  Extremities: No clubbing, cyanosis, no edema.  Neuro: Alert and oriented X 3. Moves all extremities spontaneously. Psych: Normal affect.  LABS: CBC: Recent Labs  07/13/13 1556 07/14/13 0215  WBC 7.2 6.4  HGB 15.2 13.1  HCT 41.6 37.6*  MCV 95.2 95.7  PLT 234 191   INR: Recent Labs  07/13/13 1724  INR 1.01   Basic Metabolic Panel: Recent Labs  07/13/13 1556 07/14/13 0215  NA 138 139  K 4.1 4.0  CL 101 105  CO2 27 23  GLUCOSE 143* 131*  BUN 12 10  CREATININE 0.77 0.63  CALCIUM 9.1 8.2*   Liver Function Tests: pending  Cardiac Enzymes: Recent Labs  07/13/13 2058 07/14/13 0215 07/14/13 0855  TROPONINI 11.80* >20.00* 10.86*    Recent Labs  07/13/13 1601  TROPIPOC 0.39*   Hemoglobin  A1C: Recent Labs  07/13/13 2058  HGBA1C 5.8*   Fasting Lipid Panel: Recent Labs  07/14/13 0215  CHOL 153  HDL 38*  LDLCALC 94  TRIG 161  CHOLHDL 4.0   Thyroid Function Tests: Recent Labs  07/14/13 0215  TSH 0.573   TELE:   SR, S brady, HR 50s    ECG:   Radiology/Studies: Dg Chest Portable 1 View 07/13/2013   CLINICAL DATA:  Chest pain.  EXAM: PORTABLE CHEST - 1 VIEW  COMPARISON:  None.  FINDINGS: The heart size and mediastinal contours are within normal limits. Both lungs are clear. The visualized skeletal structures are unremarkable.  IMPRESSION: No active disease.   Electronically Signed   By: Irish Lack M.D.   On: 07/13/2013 16:44     Current Medications:  . atorvastatin  80 mg Oral q1800  . atorvastatin  80 mg Oral q1800  . metoprolol tartrate  25 mg Oral BID  . pantoprazole  40 mg Oral Daily  . quinapril  40 mg Oral QPM  . Ticagrelor  90 mg Oral BID   . sodium chloride 1 mL/kg/hr (07/13/13 1950)    ASSESSMENT AND PLAN: Principal Problem:   ST elevation myocardial infarction (STEMI) of  inferior wall - s/p DES x 2 RCA, residual OM dz, rehab to see. Tolerating BB, statin, ACE, Brilinta  Active Problems:   HTN (hypertension) - BP up and down. SBP range 101-194, med changes per MD    GERD (gastroesophageal reflux disease)/Kemauri Musa's esophagus - continue PPI    Hyperglycemia - borderline DM. A1c is elevated, no Rx needed, encourage dietary changes. MD advise on nutrition to see.   Melida Quitter , PA-C 6:44 AM 07/15/2013

## 2013-07-15 NOTE — Progress Notes (Signed)
CARDIAC REHAB PHASE I   PRE:  Rate/Rhythm: 64SR  BP:  Supine: 152/67  Sitting:   Standing:    SaO2:   MODE:  Ambulation: 550 ft   POST:  Rate/Rhythm: 72SR  BP:  Supine:   Sitting: 160/70  Standing:    SaO2: 98%RA 1415-1518 Pt walked 550 ft on RA with steady gait. Denied CP. Tolerated well. Education began with pt and wife. Needs ex and CRP 2. All other ed. Completed. Discussed MI restrictions, stent/ brilinta, use on NTG and diet. Understanding voiced.   Luetta Nutting, RN BSN  07/15/2013 3:12 PM

## 2013-07-15 NOTE — Progress Notes (Signed)
The patient was examined and and agree with the assessment and plan as outlined.

## 2013-07-15 NOTE — Care Management Note (Signed)
    Page 1 of 1   07/15/2013     11:18:17 AM   CARE MANAGEMENT NOTE 07/15/2013  Patient:  Matthew Liu, Matthew Liu   Account Number:  [id number]  Date Initiated:  07/15/2013  Documentation initiated by:  Junius Creamer  Subjective/Objective Assessment:   adm w mi     Action/Plan:   lives w wife   Anticipated DC Date:     Anticipated DC Plan:        DC Planning Services  CM consult  Medication Assistance      Choice offered to / List presented to:             Status of service:   Medicare Important Message given?   (If response is "NO", the following Medicare IM given date fields will be blank) Date Medicare IM given:   Date Additional Medicare IM given:    Discharge Disposition:    Per UR Regulation:  Reviewed for med. necessity/level of care/duration of stay  If discussed at Long Length of Stay Meetings, dates discussed:    Comments:  10/6 1117 debbie Aeriel Boulay rn,bsn gave pt brilinta 30day free card.

## 2013-07-16 ENCOUNTER — Encounter (HOSPITAL_COMMUNITY): Payer: Self-pay | Admitting: Physician Assistant

## 2013-07-16 DIAGNOSIS — R739 Hyperglycemia, unspecified: Secondary | ICD-10-CM

## 2013-07-16 DIAGNOSIS — E785 Hyperlipidemia, unspecified: Secondary | ICD-10-CM

## 2013-07-16 DIAGNOSIS — Z888 Allergy status to other drugs, medicaments and biological substances status: Secondary | ICD-10-CM

## 2013-07-16 DIAGNOSIS — I2510 Atherosclerotic heart disease of native coronary artery without angina pectoris: Secondary | ICD-10-CM

## 2013-07-16 MED ORDER — NITROGLYCERIN 0.4 MG SL SUBL: 0.4000 mg | SUBLINGUAL_TABLET | SUBLINGUAL | Status: DC | PRN

## 2013-07-16 MED ORDER — ATORVASTATIN CALCIUM 80 MG PO TABS: 80.0000 mg | ORAL_TABLET | Freq: Every evening | ORAL | Status: DC

## 2013-07-16 MED ORDER — METOPROLOL TARTRATE 25 MG PO TABS: 25.0000 mg | ORAL_TABLET | Freq: Two times a day (BID) | ORAL | Status: DC

## 2013-07-16 MED ORDER — TICAGRELOR 90 MG PO TABS: 90.0000 mg | ORAL_TABLET | Freq: Two times a day (BID) | ORAL | Status: DC

## 2013-07-16 MED FILL — Sodium Chloride IV Soln 0.9%: INTRAVENOUS | Qty: 50 | Status: AC

## 2013-07-16 NOTE — Discharge Summary (Signed)
Discharge Summary   Patient ID: Matthew Liu MRN: [id number], DOB/AGE: [birth year] 72 y.o. Admit date: 07/13/2013 D/C date:     07/16/2013  Primary Cardiologist: New this adm, seen by Dr. Christy Gentles  Primary Discharge Diagnoses:  1. 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 Aspirin allergy 3. HTN 4. Dyslipidemia 5. Hyperglycemia A1C 5.8  Secondary Discharge Diagnoses:  1. GERD 2. Barett's esophagus  Hospital Course: [name] is a 72 y/o M with history of HTN and anaphylactic aspirin allergy who presented to [facility]13[facility] with chest pain. On day of admission while drinking a cup of coffee at [employer], he developed bilateral arm pain then substernal chest pain with radiation to the jaw. The pain was worse when he tried to exert himself. He denied any diaphoresis, nausea, or shortness of breath. The pain waxed and waned throughout the afternoon. He tried walking to his daughter's house and felt exhausted afterwards with continued pain so presened to the ER. He was treated with SL NTG with partial relief of CP. ECG showed NSR with 1 mm of ST elevation in lead II/III/avF with Q wave in lead 3. Code STEMI was activated. Given his aspirin allergy he was loaded with Plavix orally. He was given a bolus of heparin and taken emergently to the cath lab. This demonstrated: 1. Acute inferior myocardial infarction due to occlusion of the right coronary.  2. Successful PTCA and stenting of a totally occluded ostial stenosis to 0% with TIMI grade 3 flow. The proximal and mid vessel was also severely diseased requiring that a long segment of stent be placed (56 mm overlapping).  3. Severe circumflex obstructive disease, with a mid vessel Medina, 0/1/1 bifurcation lesion.  4. Moderate ostial LAD disease.  5. Inferior regional wall motion abnormality, but overall  preserved LV function. LVEF 55% It was decided that he should be on Brilinta instead of Plavix. He also received bivalrudin for 6 hours post-cath. Troponin rose to >20. Regarding his residual Cx disease, it was felt that he should be treated medically for now to see how he does on single platelet therapy - if he has recurrent pain, may require PCI. He remained stable post-cath. He was started on statin therapy for dyslipidemia with LDL 94, HDL 38. Today the patient is feeling well. He ambulated with cardiac rehab without complication. He was given the 30 day free rx for Brilinta as well as regular rx with refills. Dr. Katrinka Blazing has seen and examined the patient today and feels he is stable for discharge.  Consider outpt f/u labs given statin initiation. The patient was also instructed to f/u PCP for periodic monitoring of blood sugar given borderline DM identified this admission.   Discharge Vitals: Blood pressure 144/72, pulse 56, temperature 98.4 F (36.9 C), temperature source Oral, resp. rate 18, height 5\' 9"  (1.753 m), weight 165 lb (74.844 kg), SpO2 98.00%.  Labs: Lab Results  Component Value Date   WBC 6.4 07/14/2013   HGB 13.1 07/14/2013   HCT 37.6* 07/14/2013   MCV 95.7 07/14/2013   PLT 191 07/14/2013     Recent Labs Lab 07/14/13 0215 07/15/13 0830  NA 139  --   K 4.0  --   CL 105  --   CO2 23  --   BUN 10  --   CREATININE 0.63  --  CALCIUM 8.2*  --   PROT  --  6.8  BILITOT  --  1.0  ALKPHOS  --  71  ALT  --  21  AST  --  39*  GLUCOSE 131*  --     Recent Labs  07/13/13 1632 07/13/13 2058 07/14/13 0215 07/14/13 0855  TROPONINI 0.86* 11.80* >20.00* 10.86*   Lab Results  Component Value Date   CHOL 153 07/14/2013   HDL 38* 07/14/2013   LDLCALC 94 07/14/2013   TRIG 105 07/14/2013    Diagnostic Studies/Procedures   Cardiac catheterization this admission, please see full report and above for summary.  Dg Chest Portable 1 View 07/13/2013   CLINICAL DATA:  Chest pain.   EXAM: PORTABLE CHEST - 1 VIEW  COMPARISON:  None.  FINDINGS: The heart size and mediastinal contours are within normal limits. Both lungs are clear. The visualized skeletal structures are unremarkable.  IMPRESSION: No active disease.   Electronically Signed   By: Irish Lack M.D.   On: 07/13/2013 16:44    Discharge Medications     Medication List         atorvastatin 80 MG tablet  Commonly known as:  LIPITOR  Take 1 tablet (80 mg total) by mouth every evening.     metoprolol tartrate 25 MG tablet  Commonly known as:  LOPRESSOR  Take 1 tablet (25 mg total) by mouth 2 (two) times daily.     nitroGLYCERIN 0.4 MG SL tablet  Commonly known as:  NITROSTAT  Place 1 tablet (0.4 mg total) under the tongue every 5 (five) minutes as needed for chest pain (up to 3 doses).     omeprazole 40 MG capsule  Commonly known as:  PRILOSEC  Take 40 mg by mouth every morning.     quinapril 40 MG tablet  Commonly known as:  ACCUPRIL  Take 40 mg by mouth every evening.     SENIOR MULTIVITAMIN PLUS PO  Take 1 tablet by mouth daily.     Ticagrelor 90 MG Tabs tablet  Commonly known as:  BRILINTA  Take 1 tablet (90 mg total) by mouth 2 (two) times daily.        Disposition   The patient will be discharged in stable condition to home. Discharge Orders   Future Appointments Provider Department Dept Phone   07/24/2013 8:30 AM Rosalio Macadamia, NP [facility] [phone]   Future Orders Complete By Expires   Amb Referral to Cardiac Rehabilitation  As directed    Comments:     Referring to [place] Phase 2   Diet - low sodium heart healthy  As directed    Increase activity slowly  As directed    Comments:     No driving for 2 weeks. No lifting over 10 lbs for 4 weeks. No sexual activity for 4 weeks. You may not return to work until cleared by your cardiologist. Keep procedure site clean & dry. If you notice increased pain, swelling, bleeding or pus, call/return!  You may  shower, but no soaking baths/hot tubs/pools for 1 week.     Follow-up Information   Follow up with Norma Fredrickson, NP. [facility] - 07/24/13 at 8:30am)    Specialty:  Nurse Practitioner   Contact information:   [address] [place] [address] [phone]       Follow up with Primary Care Provider. (Your blood sugar was borderline elevated in the hospital. Please  make sure to follow up with your primary care provider to have this monitored.)         Duration of Discharge Encounter: Greater than 30 minutes including physician and PA time.  Signed, Ronie Spies PA-C 07/16/2013, 11:23 AM

## 2013-07-16 NOTE — Discharge Summary (Signed)
Agree with arrangements and will see patient with Lawson Fiscal on return.

## 2013-07-16 NOTE — Progress Notes (Signed)
Patient Name: Matthew Liu Date of Encounter: 07/16/2013    SUBJECTIVE: The patient is allergic to aspirin. Brilinta will be used as a single antiplatelet agent post stent. He has no angina. He has residual circumflex disease that may be treated depending upon symptoms and how the right coronary does on a single agent.   TELEMETRY:  Normal sinus rhythm: Filed Vitals:   07/15/13 1000 07/15/13 1300 07/15/13 2051 07/16/13 0501  BP: 123/57 128/76 140/78 144/72  Pulse:  57 70 56  Temp:  98 F (36.7 C) 98.9 F (37.2 C) 98.4 F (36.9 C)  TempSrc:  Oral Oral Oral  Resp: 22 20 20 18   Height:  5\' 9"  (1.753 m)    Weight:  165 lb (74.844 kg)    SpO2:  100% 98% 98%   No intake or output data in the 24 hours ending 07/16/13 1135  LABS: Basic Metabolic Panel:  Recent Labs  16/10/96 1556 07/14/13 0215  NA 138 139  K 4.1 4.0  CL 101 105  CO2 27 23  GLUCOSE 143* 131*  BUN 12 10  CREATININE 0.77 0.63  CALCIUM 9.1 8.2*   CBC:  Recent Labs  07/13/13 1556 07/14/13 0215  WBC 7.2 6.4  HGB 15.2 13.1  HCT 41.6 37.6*  MCV 95.2 95.7  PLT 234 191   Cardiac Enzymes:  Recent Labs  07/13/13 2058 07/14/13 0215 07/14/13 0855  TROPONINI 11.80* >20.00* 10.86*   Hemoglobin A1C:  Recent Labs  07/13/13 2058  HGBA1C 5.8*   Fasting Lipid Panel:  Recent Labs  07/14/13 0215  CHOL 153  HDL 38*  LDLCALC 94  TRIG 045  CHOLHDL 4.0    Radiology/Studies:  No new data  Physical Exam: Blood pressure 144/72, pulse 56, temperature 98.4 F (36.9 C), temperature source Oral, resp. rate 18, height 5\' 9"  (1.753 m), weight 165 lb (74.844 kg), SpO2 98.00%. Weight change:    S4 gallop.  Right radial cath site is unremarkable per  ASSESSMENT:   1. Acute inferior infarction. Status post overlapping stents from ostial to mid right coronary.  2. Aspirin allergy. Stent management will be with Brilinta only.   3. blood pressure control is adequate  Plan:  1. Home today on Brilinta,  metoprolol, quinapril, omeprazole and SL NTG 2. Two week OV needed.  Selinda Eon 07/16/2013, 11:35 AM

## 2013-07-16 NOTE — Progress Notes (Signed)
Matthew Liu to be D/C'd Home per MD order.  Discussed with the patient and all questions fully answered.    Medication List         atorvastatin 80 MG tablet  Commonly known as:  LIPITOR  Take 1 tablet (80 mg total) by mouth every evening.     metoprolol tartrate 25 MG tablet  Commonly known as:  LOPRESSOR  Take 1 tablet (25 mg total) by mouth 2 (two) times daily.     nitroGLYCERIN 0.4 MG SL tablet  Commonly known as:  NITROSTAT  Place 1 tablet (0.4 mg total) under the tongue every 5 (five) minutes as needed for chest pain (up to 3 doses).     omeprazole 40 MG capsule  Commonly known as:  PRILOSEC  Take 40 mg by mouth every morning.     quinapril 40 MG tablet  Commonly known as:  ACCUPRIL  Take 40 mg by mouth every evening.     SENIOR MULTIVITAMIN PLUS PO  Take 1 tablet by mouth daily.     Ticagrelor 90 MG Tabs tablet  Commonly known as:  BRILINTA  Take 1 tablet (90 mg total) by mouth 2 (two) times daily.        VVS, Skin clean, dry and intact without evidence of skin break down, no evidence of skin tears noted. IV catheters discontinued intact. Site without signs and symptoms of complications. Dressing and pressure applied. Rt radial cath site, level 0 with no complications.   An After Visit Summary was printed and given to the patient. Patient escorted via WC, and D/C home via private auto.  [place], [employer] 07/16/2013 11:50 AM

## 2013-07-16 NOTE — Progress Notes (Signed)
CARDIAC REHAB PHASE I   PRE:  Rate/Rhythm: 71SR  BP:  Supine:   Sitting: 130/70  Standing:    SaO2:   MODE:  Ambulation: 700 ft   POST:  Rate/Rhythm: 68 SR  BP:  Supine:   Sitting: 127/77  Standing:    SaO2:  0926-1000 Pt walked 700 ft with steady gait. Tolerated well. No Cp. Exercise ed completed. Discussed CRP 2 and permission given to refer to [place] program.   Luetta Nutting, RN BSN  07/16/2013 9:56 AM

## 2013-07-24 ENCOUNTER — Ambulatory Visit (INDEPENDENT_AMBULATORY_CARE_PROVIDER_SITE_OTHER): Payer: Medicare Other | Admitting: Nurse Practitioner

## 2013-07-24 ENCOUNTER — Encounter: Payer: Self-pay | Admitting: Nurse Practitioner

## 2013-07-24 VITALS — BP 140/80 | HR 56 | Ht 69.0 in | Wt 164.4 lb

## 2013-07-24 DIAGNOSIS — I221 Subsequent ST elevation (STEMI) myocardial infarction of inferior wall: Secondary | ICD-10-CM

## 2013-07-24 DIAGNOSIS — I2119 ST elevation (STEMI) myocardial infarction involving other coronary artery of inferior wall: Secondary | ICD-10-CM

## 2013-07-24 LAB — CBC WITH DIFFERENTIAL/PLATELET
Basophils Absolute: 0 10*3/uL (ref 0.0–0.1)
Basophils Relative: 0.3 % (ref 0.0–3.0)
Eosinophils Absolute: 0.1 10*3/uL (ref 0.0–0.7)
Eosinophils Relative: 2.6 % (ref 0.0–5.0)
HCT: 39 % (ref 39.0–52.0)
Hemoglobin: 13.3 g/dL (ref 13.0–17.0)
Lymphocytes Relative: 27 % (ref 12.0–46.0)
Lymphs Abs: 1.3 10*3/uL (ref 0.7–4.0)
MCHC: 34.1 g/dL (ref 30.0–36.0)
MCV: 99.1 fl (ref 78.0–100.0)
Monocytes Absolute: 0.5 10*3/uL (ref 0.1–1.0)
Monocytes Relative: 11.4 % (ref 3.0–12.0)
Neutro Abs: 2.8 10*3/uL (ref 1.4–7.7)
Neutrophils Relative %: 58.7 % (ref 43.0–77.0)
Platelets: 213 10*3/uL (ref 150.0–400.0)
RBC: 3.94 Mil/uL — ABNORMAL LOW (ref 4.22–5.81)
RDW: 12.7 % (ref 11.5–14.6)
WBC: 4.8 10*3/uL (ref 4.5–10.5)

## 2013-07-24 LAB — BASIC METABOLIC PANEL
BUN: 12 mg/dL (ref 6–23)
CO2: 30 mEq/L (ref 19–32)
Calcium: 9.1 mg/dL (ref 8.4–10.5)
Chloride: 103 mEq/L (ref 96–112)
Creatinine, Ser: 1 mg/dL (ref 0.4–1.5)
GFR: 82.5 mL/min (ref >60.00)
Glucose, Bld: 103 mg/dL — ABNORMAL HIGH (ref 70–99)
Potassium: 4 mEq/L (ref 3.5–5.1)
Sodium: 140 mEq/L (ref 135–145)

## 2013-07-24 NOTE — Patient Instructions (Addendum)
Stay on your current medicines  Ok to start walking about 15 minutes a day - add a minute a day with a goal of getting to 45 minutes per day  See Dr. Katrinka Blazing in a month with fasting labs  Call the [facility] office at [phone] if you have any questions, problems or concerns.

## 2013-07-24 NOTE — Progress Notes (Signed)
Izola Price Date of Birth: [birth year] Medical Record #[id number]  History of Present Illness: Mr. Nohr is seen back today for a post hospital visit/TOC visit. Seen for Dr. Swaziland. He has a history of HTN, HLD, hyperglycemia and anaphylactic aspirin allergy. No previously known CAD.   Most recently presented with chest pain - inferior STEMI - s/p DES x 2 to the ostial to the mid RCA. Has residual LCX disease which will be managed medically but could be considered for PCI if has recurrent symptoms. EF was 55%. Discharged on Brilinta.   Comes back today. Here with his wife. Little confused about his medicines - he is not sure why he is taking pills. No chest pain - for him it was more of bilateral arm pain - not short of breath. No swelling. Not bleeding or bruising. Wants to drive. Previously walking. Wife with multiple concerns for herself - former Dr. Daleen Squibb patient.   Current Outpatient Prescriptions  Medication Sig Dispense Refill  . allopurinol (ZYLOPRIM) 300 MG tablet Take 300 mg by mouth daily.       [name] ALPRAZolam (XANAX) 0.25 MG tablet Take 0.25 mg by mouth as needed.       [name] atorvastatin (LIPITOR) 80 MG tablet Take 1 tablet (80 mg total) by mouth every evening.  30 tablet  6  . metoprolol tartrate (LOPRESSOR) 25 MG tablet Take 1 tablet (25 mg total) by mouth 2 (two) times daily.  60 tablet  6  . Multiple Vitamins-Minerals (SENIOR MULTIVITAMIN PLUS PO) Take 1 tablet by mouth daily.      . nitroGLYCERIN (NITROSTAT) 0.4 MG SL tablet Place 1 tablet (0.4 mg total) under the tongue every 5 (five) minutes as needed for chest pain (up to 3 doses).  25 tablet  4  . omeprazole (PRILOSEC) 40 MG capsule Take 40 mg by mouth every morning.      . quinapril (ACCUPRIL) 40 MG tablet Take 40 mg by mouth every evening.       . Ticagrelor (BRILINTA) 90 MG TABS tablet Take 1 tablet (90 mg total) by mouth 2 (two) times daily.  60 tablet  10   No current facility-administered medications for this visit.     Allergies  Allergen Reactions  . Aspirin Hives    "Shortness of Breath" can't breathe  . Quinine Derivatives Hives    "look like he has the measels"    Past Medical History  Diagnosis Date  . Hypertension   . CAD (coronary artery disease)     a. Inf STEMI 07/2013: s/p DES x 2 from ostial to [facility] 07/13/13. Residual Cx disease for med rx (PCI if recurrent pain).  . Aspirin allergy     a. Anaphylactic rxn.  [name] Dyslipidemia   . GERD (gastroesophageal reflux disease)   . Barrett's esophagus   . Hyperglycemia     a. A1C 5.8 in 07/2013.    Past Surgical History  Procedure Laterality Date  . Left ankle surgery      History  Smoking status  . Never Smoker   Smokeless tobacco  . Not on file    History  Alcohol Use  . Yes    Family History  Problem Relation Age of Onset  . Hypertension Mother     Review of Systems: The review of systems is per the HPI.  All other systems were reviewed and are negative.  Physical Exam: BP 140/80  Pulse 56  Ht 5\' 9"  (1.753 m)  Wt 164  lb 6.4 oz (74.571 kg)  BMI 24.27 kg/m2 Patient is very pleasant and in no acute distress. Skin is warm and dry. Color is normal.  HEENT is unremarkable. Normocephalic/atraumatic. PERRL. Sclera are nonicteric. Neck is supple. No masses. No JVD. Lungs are clear. Cardiac exam shows a regular rate and rhythm. Abdomen is soft. Extremities are without edema. Gait and ROM are intact. No gross neurologic deficits noted. Right wrist looks ok.   LABORATORY DATA: Lab Results  Component Value Date   WBC 6.4 07/14/2013   HGB 13.1 07/14/2013   HCT 37.6* 07/14/2013   PLT 191 07/14/2013   GLUCOSE 131* 07/14/2013   CHOL 153 07/14/2013   TRIG 105 07/14/2013   HDL 38* 07/14/2013   LDLCALC 94 07/14/2013   ALT 21 07/15/2013   AST 39* 07/15/2013   NA 139 07/14/2013   K 4.0 07/14/2013   CL 105 07/14/2013   CREATININE 0.63 07/14/2013   BUN 10 07/14/2013   CO2 23 07/14/2013   TSH 0.573 07/14/2013   INR 1.01 07/13/2013   HGBA1C  5.8* 07/13/2013    Lab Results  Component Value Date   TROPONINI 10.86* 07/14/2013   CARDIAC CATH PROCEDURE TECHNIQUE: Dr. Swaziland started the case and performed. Coronary angiography. After Xylocaine anesthesia a 5 French Slender radial sheath was placed in the right radial artery with a single anterior needle wall stick. Coronary angiography was done using a 5 [place] JR 4 and JL 3.5 cm diagnostic catheters. Left ventriculography was done using hand injection via the JR 4 guide catheter. Left ventriculography was performed at the completion of the case.  Dr. Swaziland and I briefly discussed the patient's anatomy and felt that the right coronary was likely the culprit. The diagnostic angiograms appeared to suggest a flush occlusion with the aorta and other features compatible with chronic occlusion. Despite this, I decided to attempt recanalization of the right coronary. A 6 [place] JR 4 guide catheter was chosen. Bivalirudin bolus and infusion was started. The patient had been previously loaded with 600 mg of Plavix in the emergency room. He is allergic to aspirin (pulmonary and skin reaction). Luckily, the right coronary proximal segment was heavily calcified. The calcium helped to achieve coaxial alignment. I then probed the occlusion with the BMW wire and with very little difficulty we were able to cross the ostial stenosis and gain access to the distal vessel. We then performed angioplasty in the proximal and ostial segment initially with a 2.5 mm Trek balloon. This was followed by a 3.0 x 15 mm balloon. Four balloon inflations were performed. I then placed a Pro-water buddy wire distal in the right coronary. We positioned and deployed a Promus Premier 3.5 x 28 mm long stent in the midsegment deployed at 15 atmospheres for 2 inflations. The buddy wire was removed prior to stent deployment. The buddy wire was then re\re positioned distal and a second Promus Premier 3.5 x 28 mm stent was positioned overlapping  the previously placed stent and extending just outside the ostium of the right coronary. This stent was also deployed to 15 atmospheres x2 inflations. Postdilatation was then performed in 3 overlapping balloon inflations using a 20 mm long by 3.75 mm [place] Quantum Apex to 14 atmospheres in each location. TIMI grade 3 flow was reestablished. The distal territory is large. A nice distal blush was noted.  CONTRAST: Total of 235 cc.  COMPLICATIONS: None.  HEMODYNAMICS: Aortic pressure was 135/76 mmHg; LV pressure was 135/9 mmHg; LVEDP 15 mm mercury.  There was no gradient between the left ventricle and aorta.  ANGIOGRAPHIC DATA: The left main coronary artery is relatively short with distal 30% narrowing. Moderate calcification is noted..  The left anterior descending artery is transapical. There is an eccentric 40-50% proximal narrowing..  The left circumflex artery is moderate in size giving to obtuse marginal branches. There is 50% eccentric calcified proximal to ostial narrowing. The mid vessel net origin of the first obtuse marginal contains a Medina 0, with one, one bifurcation stenosis with the continuation of the circumflex being most severely involved at 99%. The first marginal contains 80% obstruction.  The right coronary artery is heavily calcified. In the ostium and proximal segment and totally occluded at the aorto ostial junction. The distal vessel was seen to fill by left to right collaterals.  CORONARY PERCUTANEOUS INTERVENTION: The totally occluded right coronary was recanalized with angioplasty followed by stenting with 0% residual stenosis and TIMI 3 flow. Myocardial blush was present, suggesting microcirculatory function.  LEFT VENTRICULOGRAM: Left ventricular angiogram was done in the 30 RAO projection and revealed inferobasal severe hypokinesis. The ejection fraction is 55%.  IMPRESSIONS: 1. Acute inferior myocardial infarction due to occlusion of the right coronary.  2. Successful PTCA and  stenting of a totally occluded ostial stenosis to 0% with TIMI grade 3 flow. The proximal and mid vessel was also severely diseased requiring that a long segment of stent be placed (56 mm overlapping).  3. Aspirin allergy.  4. Severe circumflex obstructive disease, with a mid vessel Medina, 0/1/1 bifurcation lesion.  5. Moderate ostial LAD disease.  6. Inferior regional wall motion abnormality, but overall preserved LV function. LVEF 55%  RECOMMENDATION: Brilinta 90mg  BID  Bivalirudin 0.25 mg per kilogram per hour infusion x6 hours.  Circumflex PCI at a later date, after we're certain that the patient will not have stent thrombosis on single antiplatelet therapy. The symptoms, however, couldn't force our hand.    Assessment / Plan: 1. Recent inferior MI with PCI of a long segment in the RCA - has residual LCX disease which will be medically managed - patient only on single agent for platelet inhibition - Brilinta - recheck his labs today. I think he is doing well. No recurrence of symptoms.  Not interested in cardiac rehab. See Dr.Smith back in a month with fasting labs.   2. HTN - no medicines yet today.   3. HLD - on statin.   Patient is agreeable to this plan and will call if any problems develop in the interim.   Rosalio Macadamia, RN, ANP-C [facility] [address] [place], [place]  [address]

## 2013-07-25 ENCOUNTER — Telehealth: Payer: Self-pay | Admitting: Interventional Cardiology

## 2013-07-25 NOTE — Telephone Encounter (Signed)
New message    Returned Matthew Liu's call----saw Matthew Liu yesterday

## 2013-07-25 NOTE — Telephone Encounter (Signed)
S/w pt's wife aware of results

## 2013-08-27 ENCOUNTER — Ambulatory Visit (INDEPENDENT_AMBULATORY_CARE_PROVIDER_SITE_OTHER): Payer: Medicare Other | Admitting: Interventional Cardiology

## 2013-08-27 ENCOUNTER — Encounter: Payer: Self-pay | Admitting: Interventional Cardiology

## 2013-08-27 ENCOUNTER — Other Ambulatory Visit: Payer: Medicare Other

## 2013-08-27 VITALS — BP 152/86 | HR 50 | Ht 69.0 in | Wt 167.0 lb

## 2013-08-27 DIAGNOSIS — I10 Essential (primary) hypertension: Secondary | ICD-10-CM

## 2013-08-27 DIAGNOSIS — I2510 Atherosclerotic heart disease of native coronary artery without angina pectoris: Secondary | ICD-10-CM

## 2013-08-27 DIAGNOSIS — Z888 Allergy status to other drugs, medicaments and biological substances status: Secondary | ICD-10-CM

## 2013-08-27 DIAGNOSIS — E785 Hyperlipidemia, unspecified: Secondary | ICD-10-CM

## 2013-08-27 DIAGNOSIS — T39095A Adverse effect of salicylates, initial encounter: Secondary | ICD-10-CM

## 2013-08-27 LAB — LIPID PANEL
HDL: 44.3 mg/dL (ref >39.00)
LDL Cholesterol: 45 mg/dL (ref 0–99)
Total CHOL/HDL Ratio: 2
Triglycerides: 73 mg/dL (ref 0.0–149.0)

## 2013-08-27 NOTE — Progress Notes (Signed)
Patient ID: Matthew Liu, male   DOB: 1940/11/13, 72 y.o.   MRN: [id number]    [address] [place], [place]  [address] Phone: [phone] Fax:  [phone]  Date:  08/27/2013   ID:  Matthew Liu, DOB [birth year], MRN [id number]  PCP:  Paulina Fusi, MD   ASSESSMENT:  1. Coronary artery disease with stenting during acute myocardial infarction and residual significant circumflex disease 2. Hyperlipidemia, being treated. Therapy recently started 3. Hypertension, on therapy with only borderline control. 4. Aspirin allergy  PLAN:  1. Stress Cardiolite January 2015 2. Office visit February 2015 3. Lipid panel with liver function tests 4. Discuss further intervention and concern about additional stent implantations given his aspirin allergy. He is asymptomatic at this point, he and I have mutually decided to use a more conservative approach. 5. Phase II cardiac rehabilitation. Monitor blood pressure. May need up titration of therapy    SUBJECTIVE: Matthew Liu is a 72 y.o. male denies chest discomfort since his recent myocardial infarction. He has not used nitroglycerin. He denies dyspnea. He has been relatively sedentary. He has concerns about having additional procedures done since he is unable to take aspirin. He is on single antiplatelet therapy with Brilinta. He denies palpitations and syncope. He has not started cardiac rehabilitation.   Wt Readings from Last 3 Encounters:  08/27/13 167 lb (75.751 kg)  07/24/13 164 lb 6.4 oz (74.571 kg)  07/15/13 165 lb (74.844 kg)     Past Medical History  Diagnosis Date  . Hypertension   . CAD (coronary artery disease)     a. Inf STEMI 07/2013: s/p DES x 2 from ostial to [facility] 07/13/13. Residual Cx disease for med rx (PCI if recurrent pain).  . Aspirin allergy     a. Anaphylactic rxn.  [name] Dyslipidemia   . GERD (gastroesophageal reflux disease)   . Barrett's esophagus   . Hyperglycemia     a. A1C 5.8 in 07/2013.     Current Outpatient Prescriptions  Medication Sig Dispense Refill  . allopurinol (ZYLOPRIM) 300 MG tablet Take 300 mg by mouth daily.       [name] ALPRAZolam (XANAX) 0.25 MG tablet Take 0.25 mg by mouth as needed.       [name] atorvastatin (LIPITOR) 80 MG tablet Take 1 tablet (80 mg total) by mouth every evening.  30 tablet  6  . metoprolol tartrate (LOPRESSOR) 25 MG tablet Take 1 tablet (25 mg total) by mouth 2 (two) times daily.  60 tablet  6  . Multiple Vitamins-Minerals (SENIOR MULTIVITAMIN PLUS PO) Take 1 tablet by mouth daily.      . nitroGLYCERIN (NITROSTAT) 0.4 MG SL tablet Place 1 tablet (0.4 mg total) under the tongue every 5 (five) minutes as needed for chest pain (up to 3 doses).  25 tablet  4  . omeprazole (PRILOSEC) 40 MG capsule Take 40 mg by mouth every morning.      . quinapril (ACCUPRIL) 40 MG tablet Take 40 mg by mouth every evening.       . Ticagrelor (BRILINTA) 90 MG TABS tablet Take 1 tablet (90 mg total) by mouth 2 (two) times daily.  60 tablet  10   No current facility-administered medications for this visit.    Allergies:    Allergies  Allergen Reactions  . Aspirin Hives    "Shortness of Breath" can't breathe  . Quinine Derivatives Hives    "look like he has the measels"  Social History:  The patient  reports that he has never smoked. He does not have any smokeless tobacco history on file. He reports that he drinks alcohol. He reports that he does not use illicit drugs.   ROS:  Please see the history of present illness. Denies bleeding, palpitations, syncope, claudication, and transient neurological symptoms. All other systems reviewed and negative.   OBJECTIVE: VS:  BP 152/86  Pulse 50  Ht 5\' 9"  (1.753 m)  Wt 167 lb (75.751 kg)  BMI 24.65 kg/m2 Well nourished, well developed, in no acute distress, appearing his stated age HEENT: normal Neck: JVD flat. Carotid bruit absent  Cardiac:  normal S1, S2; RRR; no murmur Lungs:  clear to auscultation bilaterally,  no wheezing, rhonchi or rales Abd: soft, nontender, no hepatomegaly Ext: Edema absent. Pulses 2+ Skin: warm and dry Neuro:  CNs 2-12 intact, no focal abnormalities noted  EKG:  Not repeated       Signed, Darci Needle III, MD 08/27/2013 10:00 AM

## 2013-08-27 NOTE — Patient Instructions (Addendum)
Your physician recommends that you continue on your current medications as directed. Please refer to the Current Medication list given to you today.  Lab today:Lipid panel  You have been referred to Cardiac Rehab [facility]  Your physician has requested that you have en exercise stress myoview. For further information please visit [email or web address] Please follow instruction sheet, as given.(To be scheduled Jan 2015)  Your physician recommends that you schedule a follow-up appointment in: Feb 2015

## 2013-08-30 ENCOUNTER — Other Ambulatory Visit: Payer: Self-pay

## 2013-08-30 ENCOUNTER — Telehealth: Payer: Self-pay

## 2013-08-30 DIAGNOSIS — E785 Hyperlipidemia, unspecified: Secondary | ICD-10-CM

## 2013-08-30 MED ORDER — ATORVASTATIN CALCIUM 40 MG PO TABS: 40.0000 mg | ORAL_TABLET | Freq: Every evening | ORAL | Status: DC

## 2013-08-30 NOTE — Telephone Encounter (Signed)
Message copied by Jarvis Newcomer on Fri Aug 30, 2013  3:27 PM ------      Message from: Verdis Prime      Created: Fri Aug 30, 2013  2:02 PM       Decrease atorvastatin to 40 mg daily. Lipid and hepatic in 3 months ------

## 2013-08-30 NOTE — Telephone Encounter (Signed)
Follow up   Pt's wife returned your call

## 2013-08-30 NOTE — Telephone Encounter (Signed)
pt given lab results.and Dr.Smith instructions to decrease atorvastatin to 40 mg daily. Lipid and hepatic in 3 months. pt will have labs drawn same day ad f/u appt 11/19/13 with Dr.Smith.pt verbalized understanding.

## 2013-08-30 NOTE — Telephone Encounter (Signed)
cardiac rehab form given to medical records to be faxed to [phone] [facility] cardiac rehab

## 2013-09-26 ENCOUNTER — Telehealth: Payer: Self-pay | Admitting: Interventional Cardiology

## 2013-09-26 NOTE — Telephone Encounter (Signed)
Called patient and his wife back. He saw Dr.Schultz yesterday for leg swelling. The patient reports that he stopped his Norvasc and started Lasix and Keflex. They are concerned that his legs are still swollen. Advised that the medication changes might take a few days and to call Dr.Schultz if condition not improved.  He asked if he should go to cardiac rehab tomorrow. Advised if his legs are still bothering him he should call them and reschedule for Monday.

## 2013-09-26 NOTE — Telephone Encounter (Signed)
New message  Patients feet are very swollen. He is taken Lasix and antibiotics. His wife is very concerned, patient legs are warm to touch and very red. Please call and advise.

## 2013-11-05 ENCOUNTER — Ambulatory Visit (HOSPITAL_COMMUNITY): Payer: Medicare Other | Attending: Cardiology | Admitting: Radiology

## 2013-11-05 ENCOUNTER — Encounter: Payer: Self-pay | Admitting: Cardiology

## 2013-11-05 VITALS — BP 154/76 | Ht 69.0 in | Wt 158.0 lb

## 2013-11-05 DIAGNOSIS — I252 Old myocardial infarction: Secondary | ICD-10-CM | POA: Insufficient documentation

## 2013-11-05 DIAGNOSIS — I2510 Atherosclerotic heart disease of native coronary artery without angina pectoris: Secondary | ICD-10-CM | POA: Insufficient documentation

## 2013-11-05 DIAGNOSIS — R0989 Other specified symptoms and signs involving the circulatory and respiratory systems: Secondary | ICD-10-CM | POA: Insufficient documentation

## 2013-11-05 DIAGNOSIS — R0609 Other forms of dyspnea: Secondary | ICD-10-CM | POA: Insufficient documentation

## 2013-11-05 DIAGNOSIS — I10 Essential (primary) hypertension: Secondary | ICD-10-CM | POA: Insufficient documentation

## 2013-11-05 MED ORDER — TECHNETIUM TC 99M SESTAMIBI GENERIC - CARDIOLITE
30.0000 | Freq: Once | INTRAVENOUS | Status: AC | PRN
  Administered 2013-11-05: 30 via INTRAVENOUS

## 2013-11-05 MED ORDER — TECHNETIUM TC 99M SESTAMIBI GENERIC - CARDIOLITE
10.0000 | Freq: Once | INTRAVENOUS | Status: AC | PRN
  Administered 2013-11-05: 10 via INTRAVENOUS

## 2013-11-05 NOTE — Progress Notes (Signed)
[facility] [address] [place], [place] [address] [phone]    Cardiology Nuclear Med Study  Matthew Liu is a 73 y.o. male     MRN : [id number]     DOB: June 26, 1941  Procedure Date: 11/05/2013  Nuclear Med Background Indication for Stress Test:  Evaluation for Ischemia and Stent Patency History:  CAD, 10/14 MI-Inferior-Cath-Residual CFX Dz 10-14 Stent-RCA x2 Cardiac Risk Factors: Hypertension and Lipids  Symptoms:  n/a   Nuclear Pre-Procedure Caffeine/Decaff Intake:  None > 12 hrs NPO After: 5:00pm   Lungs:  clear O2 Sat: 95% on room air. IV 0.9% NS with Angio Cath:  22g  IV Site: R Antecubital x 1, tolerated well IV Started by:  Irven Baltimore, RN  Chest Size (in):  40 Cup Size: n/a  Height: 5\' 9"  (1.753 m)  Weight:  158 lb (71.668 kg)  BMI:  Body mass index is 23.32 kg/(m^2). Tech Comments:  Took Lopressor at 7:30pm yesterday.    Nuclear Med Study 1 or 2 day study: 1 day  Stress Test Type:  Stress  Reading MD: N/A  Order Authorizing Provider:  Daneen Schick, III, MD  Resting Radionuclide: Technetium 62m Sestamibi  Resting Radionuclide Dose: 11.0 mCi   Stress Radionuclide:  Technetium 21m Sestamibi  Stress Radionuclide Dose: 33.0 mCi           Stress Protocol Rest HR: 50 Stress HR: 133  Rest BP: 154/76 Stress BP: 208/96  Exercise Time (min): 9:00 METS: 10.10   Predicted Max HR: 147 bpm % Max HR: 90.48 bpm Rate Pressure Product: 27664   Dose of Adenosine (mg):  n/a Dose of Lexiscan: n/a mg  Dose of Atropine (mg): n/a Dose of Dobutamine: n/a mcg/kg/min (at max HR)  Stress Test Technologist: Perrin Maltese, EMT-P  Nuclear Technologist:  Charlton Amor, CNMT     Rest Procedure:  Myocardial perfusion imaging was performed at rest 45 minutes following the intravenous administration of Technetium 28m Sestamibi. Rest ECG: NSR with non-specific ST-T wave changes and Sinus bradycardia, otherwise normal  Stress Procedure:  The patient  exercised on the treadmill utilizing the Bruce Protocol for 9:00 minutes. The patient stopped due to sob and denied any chest pain.  Technetium 60m Sestamibi was injected at peak exercise and myocardial perfusion imaging was performed after a brief delay. Stress ECG: No significant change from baseline ECG  QPS Raw Data Images:  Normal; no motion artifact; normal heart/lung ratio. Stress Images:  There is small area severe severity perfusion defect in the basal and mid inferior wall. Rest Images:  There is small area severe severity perfusion defect in the basal and mid inferior wall. Subtraction (SDS):  There is a fixed defect in the basal and mid inferior walls. Transient Ischemic Dilatation (Normal <1.22):  1.03 Lung/Heart Ratio (Normal <0.45):  0.32  Quantitative Gated Spect Images QGS EDV:  133 ml QGS ESV:  60 ml  Impression Exercise Capacity:  Excellent exercise capacity. BP Response:  Hypertensive blood pressure response. Clinical Symptoms:  There is dyspnea. ECG Impression:  No significant ST segment change suggestive of ischemia. Comparison with Prior Nuclear Study: No previous nuclear study performed  Overall Impression:  Low risk stress nuclear study with a small infarct in the RCA territory with no evidence of ischemia..  LV Ejection Fraction: 55%.  LV Wall Motion:  NL LV Function; Akinesis of the basal and mid inferior walls.   Ena Dawley, Lemmie Evens 11/05/2013

## 2013-11-11 ENCOUNTER — Telehealth: Payer: Self-pay

## 2013-11-11 NOTE — Telephone Encounter (Signed)
pt given stress test results.Study is low risk. Continue same therapy. No change is needed.pt verbalized understanding.

## 2013-11-11 NOTE — Telephone Encounter (Signed)
Message copied by Lamar Laundry on Mon Nov 11, 2013 11:00 AM ------      Message from: Daneen Schick      Created: Fri Nov 08, 2013  5:50 PM       Study is low risk. Continue same therapy. No change is needed. ------

## 2013-11-11 NOTE — Telephone Encounter (Signed)
Follow Up  Pt returned call for results// please call

## 2013-11-11 NOTE — Telephone Encounter (Signed)
lmom.called to give pt nuclear stress test results.Study is low risk. Continue same therapy. No change is needed.

## 2013-11-19 ENCOUNTER — Encounter: Payer: Self-pay | Admitting: Interventional Cardiology

## 2013-11-19 ENCOUNTER — Ambulatory Visit (INDEPENDENT_AMBULATORY_CARE_PROVIDER_SITE_OTHER): Payer: Medicare Other | Admitting: Interventional Cardiology

## 2013-11-19 VITALS — BP 153/74 | HR 54 | Ht 69.5 in | Wt 160.0 lb

## 2013-11-19 DIAGNOSIS — I10 Essential (primary) hypertension: Secondary | ICD-10-CM

## 2013-11-19 DIAGNOSIS — Z888 Allergy status to other drugs, medicaments and biological substances status: Secondary | ICD-10-CM

## 2013-11-19 DIAGNOSIS — I2510 Atherosclerotic heart disease of native coronary artery without angina pectoris: Secondary | ICD-10-CM

## 2013-11-19 DIAGNOSIS — Z886 Allergy status to analgesic agent status: Secondary | ICD-10-CM

## 2013-11-19 DIAGNOSIS — T39095A Adverse effect of salicylates, initial encounter: Secondary | ICD-10-CM

## 2013-11-19 NOTE — Progress Notes (Signed)
Patient ID: Matthew Liu, male   DOB: [birth year], 73 y.o.   MRN: [id number]    [address] [place], [place]  [address] Phone: [phone] Fax:  [phone]  Date:  11/19/2013   ID:  Matthew Liu, DOB 15-Jul-1941, MRN [id number]  PCP:  Nicoletta Dress, MD   ASSESSMENT:  1. Coronary artery disease with DES RCA. Asymptomatic 2. Hyperlipidemia, tolerating therapy at this time  PLAN:  1. Continue Brilinta 2. Clinical followup in 6 months 3. Physical activity as tolerated   SUBJECTIVE: Matthew Liu is a 73 y.o. male who is asymptomatic. He has had swelling in his left testicle. He has gone for ultrasound and was told he has a cyst. His primary care physician has been managing this. He states that there is no plan for any further action. He is concerned about its presence and wants further definition of exactly what the natural history may be and what other treatment options might be available.  He denies orthopnea, PND, palpitations, syncope, and medication side effects. He is allergic to aspirin.   Wt Readings from Last 3 Encounters:  11/19/13 160 lb (72.576 kg)  11/05/13 158 lb (71.668 kg)  08/27/13 167 lb (75.751 kg)     Past Medical History  Diagnosis Date  . Hypertension   . CAD (coronary artery disease)     a. Inf STEMI 07/2013: s/p DES x 2 from ostial to [facility] 07/13/13. Residual Cx disease for med rx (PCI if recurrent pain).  . Aspirin allergy     a. Anaphylactic rxn.  [name] Dyslipidemia   . GERD (gastroesophageal reflux disease)   . Barrett's esophagus   . Hyperglycemia     a. A1C 5.8 in 07/2013.    Current Outpatient Prescriptions  Medication Sig Dispense Refill  . allopurinol (ZYLOPRIM) 300 MG tablet Take 300 mg by mouth daily.       [name] ALPRAZolam (XANAX) 0.25 MG tablet Take 0.25 mg by mouth as needed.       [name] atorvastatin (LIPITOR) 40 MG tablet Take 1 tablet (40 mg total) by mouth every evening.      . metoprolol tartrate (LOPRESSOR) 25 MG tablet  Take 1 tablet (25 mg total) by mouth 2 (two) times daily.  60 tablet  6  . Multiple Vitamins-Minerals (SENIOR MULTIVITAMIN PLUS PO) Take 1 tablet by mouth daily.      . nitroGLYCERIN (NITROSTAT) 0.4 MG SL tablet Place 1 tablet (0.4 mg total) under the tongue every 5 (five) minutes as needed for chest pain (up to 3 doses).  25 tablet  4  . omeprazole (PRILOSEC) 40 MG capsule Take 40 mg by mouth every morning.      . quinapril (ACCUPRIL) 40 MG tablet Take 40 mg by mouth every evening.       . Ticagrelor (BRILINTA) 90 MG TABS tablet Take 1 tablet (90 mg total) by mouth 2 (two) times daily.  60 tablet  10   No current facility-administered medications for this visit.    Allergies:    Allergies  Allergen Reactions  . Aspirin Hives    "Shortness of Breath" can't breathe  . Quinine Derivatives Hives    "look like he has the measels"    Social History:  The patient  reports that he has never smoked. He does not have any smokeless tobacco history on file. He reports that he drinks alcohol. He reports that he does not use illicit drugs.   ROS:  Please see the history of present illness.   No blood in urine or stool   All other systems reviewed and negative.   OBJECTIVE: VS:  BP 153/74  Pulse 54  Ht 5' 9.5" (1.765 m)  Wt 160 lb (72.576 kg)  BMI 23.30 kg/m2 Well nourished, well developed, in no acute distress, appears younger than stated age 89: normal Neck: JVD flat. Carotid bruit absent  Cardiac:  normal S1, S2; RRR; no murmur Lungs:  clear to auscultation bilaterally, no wheezing, rhonchi or rales Abd: soft, nontender, no hepatomegaly Ext: Edema absent. Pulses 2+ and symmetric Skin: warm and dry Neuro:  CNs 2-12 intact, no focal abnormalities noted  EKG:  Not performed today       Signed, Illene Labrador III, MD 11/19/2013 9:36 AM

## 2013-11-19 NOTE — Patient Instructions (Signed)
Your physician recommends that you continue on your current medications as directed. Please refer to the Current Medication list given to you today.  Your physician wants you to follow-up in: 6 months. You will receive a reminder letter in the mail two months in advance. If you don't receive a letter, please call our office to schedule the follow-up appointment.  

## 2014-02-06 ENCOUNTER — Other Ambulatory Visit (HOSPITAL_COMMUNITY): Payer: Self-pay | Admitting: Physician Assistant

## 2014-03-11 ENCOUNTER — Telehealth: Payer: Self-pay | Admitting: Interventional Cardiology

## 2014-03-11 NOTE — Telephone Encounter (Signed)
New message    Patient calling stating someone called his home this am.

## 2014-03-11 NOTE — Telephone Encounter (Signed)
called pt regarding paperwork pt dropped off.adv pt that his ins co.if offering him a healthcare oppurtunity for refill reminders.nothing for Dr.Smith to complete.pt verbalized understanding.pt sts that one of his testicles  has a large cyst.pt sts that it is growing in size.pt sts that his urologist wanted him to hold off in removing it due to him having cardiac stents in 07/2014.adv pt to f/u with his urologist he will adv him went it is safe to proceed and rqst cardiac clearance form Dr.Smith if needed.pt verbalized understanding

## 2014-03-14 ENCOUNTER — Telehealth: Payer: Self-pay | Admitting: Interventional Cardiology

## 2014-03-14 ENCOUNTER — Encounter: Payer: Self-pay | Admitting: Interventional Cardiology

## 2014-03-14 NOTE — Telephone Encounter (Signed)
returned pt call.pt sts that he is suppose to have surgery with his kidney doctor.pt sts that he will need to have a cyst removed from his kidney.pt sts that he will need to hold brilinta.adv him to have his nephrologist send over cardic clearnace with more detail for Dr.Smith to review ex.how long will he need to hold med, what type of sx.adv pt that if cleared we would send back to nephrologist and make him aware.

## 2014-03-14 NOTE — Telephone Encounter (Signed)
New message    Wife calling need to speak with the nurse need clarification on blood thinner.     Went to kidney MD -  next step until he come off blood thinner.

## 2014-03-20 NOTE — Telephone Encounter (Signed)
spoke with pt wife and sts that pt cyst on testicle has grown in size.pt denies chest pain,sob,edema.pt wife is concerned and anxious that pt cyst might be cancerous.she sts that pt is low energy.pt was previously adv by Dr[facility]Wrenn @[facility] that he would be monitored for now, pt had stent placed in 07/2014 and is on brilinta. Called [facility] and spoke with [facility] Dr.Wrenn sx scheduler she sts that cardiac clearance had not been rqst because pt f/u is not until Sept.2015.she suggest pt call to move up f/u if there are changes.pt wife adv to call [facility] to move up f/u.pt seems ok form a cardiac standpoint.pt wife verbalized understanding.

## 2014-03-20 NOTE — Telephone Encounter (Signed)
New Message  Pt wife called states that the pt is fragile, very skinny, and often tired.. Requets a call back to discuss.

## 2014-03-20 NOTE — Telephone Encounter (Signed)
returned pt call. lmtcb 

## 2014-05-02 ENCOUNTER — Other Ambulatory Visit (HOSPITAL_COMMUNITY): Payer: Self-pay | Admitting: Interventional Cardiology

## 2014-06-09 ENCOUNTER — Ambulatory Visit (INDEPENDENT_AMBULATORY_CARE_PROVIDER_SITE_OTHER): Payer: Medicare Other | Admitting: Interventional Cardiology

## 2014-06-09 ENCOUNTER — Encounter: Payer: Self-pay | Admitting: Interventional Cardiology

## 2014-06-09 VITALS — BP 120/68 | HR 62 | Ht 69.5 in | Wt 166.0 lb

## 2014-06-09 DIAGNOSIS — Z888 Allergy status to other drugs, medicaments and biological substances status: Secondary | ICD-10-CM

## 2014-06-09 DIAGNOSIS — I2510 Atherosclerotic heart disease of native coronary artery without angina pectoris: Secondary | ICD-10-CM

## 2014-06-09 DIAGNOSIS — I10 Essential (primary) hypertension: Secondary | ICD-10-CM

## 2014-06-09 DIAGNOSIS — E785 Hyperlipidemia, unspecified: Secondary | ICD-10-CM

## 2014-06-09 DIAGNOSIS — T39095A Adverse effect of salicylates, initial encounter: Secondary | ICD-10-CM

## 2014-06-09 MED ORDER — CLOPIDOGREL BISULFATE 75 MG PO TABS: 75.0000 mg | ORAL_TABLET | Freq: Every day | ORAL | Status: DC

## 2014-06-09 NOTE — Patient Instructions (Signed)
Your physician has recommended you make the following change in your medication:   1. Stop Brilinta after you run out of your current supply.   2. Start Plavix 75 mg once a day after you finish out your supply of Brilinta.   Your physician wants you to follow-up in: 6 months with Dr. Tamala Julian. You will receive a reminder letter in the mail two months in advance. If you don't receive a letter, please call our office to schedule the follow-up appointment.

## 2014-06-09 NOTE — Progress Notes (Signed)
Patient ID: RITCHARD PARAGAS, male   DOB: 23-May-1941, 73 y.o.   MRN: [id number]    [address] [place], [place]  [address] Phone: [phone] Fax:  [phone]  Date:  06/09/2014   ID:  PARKER WHERLEY, DOB 12/10/1940, MRN [id number]  PCP:  Nicoletta Dress, MD   ASSESSMENT:  1. Coronary atherosclerosis with right coronary stent in October 2014. Low risk myocardial perfusion study in January 2015. Asymptomatic with reference to coronary symptoms. 2. Scrotal Varicocele 3. Hypertension 4. Preoperative cardiovascular evaluation 5. Aspirin allergy  PLAN:  1. Change to Plavix 75 mg daily. Discontinue Brilinta. Would be okay to discontinue Plavix for 7 days prior to any urological procedure by Dr. Jeffie Pollock 2. He is cleared to have urologic surgery 3. Clinical followup in 6 months   SUBJECTIVE: Matthew Liu is a 73 y.o. male patient has no cardiac symptoms. He was having urological procedure because of a varicocele in his scrotum. He had not had clearance prior to now because of stent placement in October 2014. He is very active and has had no symptoms.   Wt Readings from Last 3 Encounters:  06/09/14 166 lb (75.297 kg)  11/19/13 160 lb (72.576 kg)  11/05/13 158 lb (71.668 kg)     Past Medical History  Diagnosis Date  . Hypertension   . CAD (coronary artery disease)     a. Inf STEMI 07/2013: s/p DES x 2 from ostial to [facility] 07/13/13. Residual Cx disease for med rx (PCI if recurrent pain).  . Aspirin allergy     a. Anaphylactic rxn.  [name] Dyslipidemia   . GERD (gastroesophageal reflux disease)   . Barrett's esophagus   . Hyperglycemia     a. A1C 5.8 in 07/2013.    Current Outpatient Prescriptions  Medication Sig Dispense Refill  . allopurinol (ZYLOPRIM) 300 MG tablet Take 300 mg by mouth daily.       [name] ALPRAZolam (XANAX) 0.25 MG tablet Take 0.25 mg by mouth as needed.       [name] atorvastatin (LIPITOR) 40 MG tablet Take 1 tablet (40 mg total) by mouth every evening.        . metoprolol tartrate (LOPRESSOR) 25 MG tablet TAKE 1 TABLET BY MOUTH TWICE A DAY  60 tablet  2  . Multiple Vitamins-Minerals (SENIOR MULTIVITAMIN PLUS PO) Take 1 tablet by mouth daily.      . nitroGLYCERIN (NITROSTAT) 0.4 MG SL tablet Place 1 tablet (0.4 mg total) under the tongue every 5 (five) minutes as needed for chest pain (up to 3 doses).  25 tablet  4  . omeprazole (PRILOSEC) 40 MG capsule Take 40 mg by mouth every morning.      . quinapril (ACCUPRIL) 40 MG tablet Take 40 mg by mouth every evening.       . Ticagrelor (BRILINTA) 90 MG TABS tablet Take 1 tablet (90 mg total) by mouth 2 (two) times daily.  60 tablet  10   No current facility-administered medications for this visit.    Allergies:    Allergies  Allergen Reactions  . Aspirin Hives    "Shortness of Breath" can't breathe  . Quinine Derivatives Hives    "look like he has the measels"    Social History:  The patient  reports that he has never smoked. He does not have any smokeless tobacco history on file. He reports that he drinks alcohol. He reports that he does not use illicit drugs.   ROS:  Please see the history of present illness.   Denies chest pain. No palpitations. Denies syncope. He has not had orthopnea or PND. No blood in the urine or stool. He has a large scrotal swelling. He is seeing Dr. Jeffie Pollock and there is a procedure that can be done to improve his situation. The patient will also have this done soon.   All other systems reviewed and negative.   OBJECTIVE: VS:  BP 120/68  Pulse 62  Ht 5' 9.5" (1.765 m)  Wt 166 lb (75.297 kg)  BMI 24.17 kg/m2 Well nourished, well developed, in no acute distress, appears his stated age 53: normal Neck: JVD flat. Carotid bruit absent  Cardiac:  normal S1, S2; RRR; no murmur Lungs:  clear to auscultation bilaterally, no wheezing, rhonchi or rales Abd: soft, nontender, no hepatomegaly Ext: Edema absent. Pulses 2+ Skin: warm and dry Neuro:  CNs 2-12 intact, no focal  abnormalities noted  EKG:  Not performed       Signed, Illene Labrador III, MD 06/09/2014 4:21 PM

## 2014-06-26 ENCOUNTER — Other Ambulatory Visit: Payer: Self-pay | Admitting: Urology

## 2014-07-08 ENCOUNTER — Encounter (HOSPITAL_COMMUNITY): Payer: Self-pay | Admitting: Pharmacist

## 2014-07-08 NOTE — Patient Instructions (Addendum)
Matthew Liu  07/08/2014   Your procedure is scheduled on:  07/17/2014    Report to [facility].  Follow the Signs to [place] at   0530     am  Call this number if you have problems the morning of surgery: [phone]   Remember:   Do not eat food or drink liquids after midnight.   Take these medicines the morning of surgery with A SIP OF WATER: Allopurinol, Xanx if needed, Metoprolol( Lopressor) , Prilosec    Do not wear jewelry,   Do not wear lotions, powders, or perfumes deodorant.  . Men may shave face and neck.  Do not bring valuables to the hospital.  Contacts, dentures or bridgework may not be worn into surgery.     Patients discharged the day of surgery will not be allowed to drive  home.  Name and phone number of your driver:      Please read over the following fact sheets that you were given: [facility] - Preparing for Surgery Before surgery, you can play an important role.  Because skin is not sterile, your skin needs to be as free of germs as possible.  You can reduce the number of germs on your skin by washing with CHG (chlorahexidine gluconate) soap before surgery.  CHG is an antiseptic cleaner which kills germs and bonds with the skin to continue killing germs even after washing. Please DO NOT use if you have an allergy to CHG or antibacterial soaps.  If your skin becomes reddened/irritated stop using the CHG and inform your nurse when you arrive at Short Stay. Do not shave (including legs and underarms) for at least 48 hours prior to the first CHG shower.  You may shave your face/neck. Please follow these instructions carefully:  1.  Shower with CHG Soap the night before surgery and the  morning of Surgery.  2.  If you choose to wash your hair, wash your hair first as usual with your  normal  shampoo.  3.  After you shampoo, rinse your hair and body thoroughly to remove the  shampoo.                           4.  Use CHG as you would any other  liquid soap.  You can apply chg directly  to the skin and wash                       Gently with a scrungie or clean washcloth.  5.  Apply the CHG Soap to your body ONLY FROM THE NECK DOWN.   Do not use on face/ open                           Wound or open sores. Avoid contact with eyes, ears mouth and genitals (private parts).                       Wash face,  Genitals (private parts) with your normal soap.             6.  Wash thoroughly, paying special attention to the area where your surgery  will be performed.  7.  Thoroughly rinse your body with warm water from the neck down.  8.  DO NOT shower/wash with your normal soap after using and rinsing off  the  CHG Soap.                9.  Pat yourself dry with a clean towel.            10.  Wear clean pajamas.            11.  Place clean sheets on your bed the night of your first shower and do not  sleep with pets. Day of Surgery : Do not apply any lotions/deodorants the morning of surgery.  Please wear clean clothes to the hospital/surgery center.  FAILURE TO FOLLOW THESE INSTRUCTIONS MAY RESULT IN THE CANCELLATION OF YOUR SURGERY PATIENT SIGNATURE_________________________________  NURSE SIGNATURE__________________________________  ________________________________________________________________________ , coughing and deep breathing exercises, leg exercises

## 2014-07-09 ENCOUNTER — Ambulatory Visit (HOSPITAL_COMMUNITY)
Admission: RE | Admit: 2014-07-09 | Discharge: 2014-07-09 | Disposition: A | Discharge status: Home / Self Care | Payer: Medicare Other | Source: Ambulatory Visit | Attending: Urology | Admitting: Urology

## 2014-07-09 ENCOUNTER — Encounter (HOSPITAL_COMMUNITY)
Admission: RE | Admit: 2014-07-09 | Discharge: 2014-07-09 | Disposition: A | Discharge status: Home / Self Care | Payer: Medicare Other | Source: Ambulatory Visit | Attending: Urology | Admitting: Urology

## 2014-07-09 ENCOUNTER — Encounter (HOSPITAL_COMMUNITY): Payer: Self-pay | Admitting: Pharmacy Technician

## 2014-07-09 ENCOUNTER — Encounter (HOSPITAL_COMMUNITY): Payer: Self-pay

## 2014-07-09 DIAGNOSIS — R918 Other nonspecific abnormal finding of lung field: Secondary | ICD-10-CM | POA: Diagnosis not present

## 2014-07-09 DIAGNOSIS — Z01818 Encounter for other preprocedural examination: Secondary | ICD-10-CM | POA: Insufficient documentation

## 2014-07-09 DIAGNOSIS — M47814 Spondylosis without myelopathy or radiculopathy, thoracic region: Secondary | ICD-10-CM | POA: Diagnosis not present

## 2014-07-09 DIAGNOSIS — I10 Essential (primary) hypertension: Secondary | ICD-10-CM | POA: Insufficient documentation

## 2014-07-09 DIAGNOSIS — I700 Atherosclerosis of aorta: Secondary | ICD-10-CM | POA: Diagnosis not present

## 2014-07-09 HISTORY — DX: Anxiety disorder, unspecified: F41.9

## 2014-07-09 HISTORY — DX: Acute myocardial infarction, unspecified: I21.9

## 2014-07-09 LAB — BASIC METABOLIC PANEL
ANION GAP: 13 (ref 5–15)
BUN: 17 mg/dL (ref 6–23)
CALCIUM: 9.1 mg/dL (ref 8.4–10.5)
CO2: 24 mEq/L (ref 19–32)
Chloride: 101 mEq/L (ref 96–112)
Creatinine, Ser: 0.92 mg/dL (ref 0.50–1.35)
GFR calc non Af Amer: 81 mL/min — ABNORMAL LOW (ref >90)
Glucose, Bld: 115 mg/dL — ABNORMAL HIGH (ref 70–99)
Potassium: 4.6 mEq/L (ref 3.7–5.3)
Sodium: 138 mEq/L (ref 137–147)

## 2014-07-09 LAB — CBC
HEMATOCRIT: 38.8 % — AB (ref 39.0–52.0)
Hemoglobin: 13.5 g/dL (ref 13.0–17.0)
MCH: 34.8 pg — AB (ref 26.0–34.0)
MCHC: 34.8 g/dL (ref 30.0–36.0)
MCV: 100 fL (ref 78.0–100.0)
PLATELETS: 158 10*3/uL (ref 150–400)
RBC: 3.88 MIL/uL — ABNORMAL LOW (ref 4.22–5.81)
RDW: 13.6 % (ref 11.5–15.5)
WBC: 9.4 10*3/uL (ref 4.0–10.5)

## 2014-07-09 LAB — PROTIME-INR
INR: 1.07 (ref 0.00–1.49)
Prothrombin Time: 13.9 seconds (ref 11.6–15.2)

## 2014-07-09 LAB — APTT: aPTT: 28 seconds (ref 24–37)

## 2014-07-09 NOTE — Progress Notes (Addendum)
Dr Daneen Schick- 06/09/2014 LOV note in EPIC  Stress 11/06/2013- EPIC  Patient has written instructions regarding Brilinta and Plavix from MD.   Patient verbalizes understanding.

## 2014-07-09 NOTE — Progress Notes (Signed)
2V CXR results from 07/09/14 faxed via EPIC to Dr Jeffie Pollock.

## 2014-07-16 NOTE — H&P (Signed)
Reason For Visit Seen today for a pre-op visit.   Active Problems Problems  1. 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 Preop examination (V72.84)   Assessed By: Jimmey Ralph (Urology); Last Assessed: 04 Jul 2014  History of Present Illness 73 YO male patient of Dr. Ralene Muskrat seen today for a preop visit. Scheduled for (L) hydrocelectomy 07/17/14.    GU HX:  Hx of a hydrocele with some interval increase in the size of the scrotal swelling. He has no scrotal pain but is affecting QOL. He remains on Brilinta for his cardiac stent that was placed on 08/02/14.     Sept 2015 Interval HX:   Denies any issues today. No fever, chills, cough, or congestion.   Past Medical History Problems  1. History of Anxiety (300.00) 2. History of esophageal reflux (V12.79) 3. History of gout (V12.29) 4. History of hypertension (V12.59) 5. History of myocardial infarction (412)  Surgical History Problems  1. History of Ankle Surgery 2. History of Cath Stent Placement  Current Meds 1. Allopurinol 300 MG Oral Tablet;  Therapy: (Recorded:04Mar2015) to Recorded 2. ALPRAZolam 0.25 MG Oral Tablet;  Therapy: (Recorded:04Mar2015) to Recorded 3. Atorvastatin Calcium 40 MG Oral Tablet;  Therapy: (Recorded:04Mar2015) to Recorded 4. Brilinta 90 MG Oral Tablet;  Therapy: (Recorded:04Mar2015) to Recorded 5. Metoprolol Tartrate 25 MG Oral Tablet;  Therapy: (Recorded:04Mar2015) to Recorded 6. Nitroglycerin 0.4 MG SUBL;  Therapy: (Recorded:04Mar2015) to Recorded 7. Omeprazole 40 MG Oral Capsule Delayed Release;  Therapy: (Recorded:04Mar2015) to Recorded 8. Quinapril HCl - 40 MG Oral Tablet;  Therapy: (Recorded:04Mar2015) to Recorded 9. Senior Multivitamin Plus TABS;  Therapy: (Recorded:04Mar2015) to Recorded  Allergies Medication  1. Aspirin TABS 2. Quinine Derivatives  Family History Problems  1. No pertinent family history :  Sibling  Social History Problems  1. Daily alcohol use 2. Married 3. Never smoker 4. No caffeine use 5. Retired 5. Two children  Review of Systems Genitourinary, constitutional, skin, eye, otolaryngeal, hematologic/lymphatic, cardiovascular, pulmonary, endocrine, musculoskeletal, gastrointestinal, neurological and psychiatric system(s) were reviewed and pertinent findings if present are noted.    Vitals Vital Signs [Data Includes: Last 1 Day]  Recorded: [id number] 10:07AM  Weight: 165 lb  BMI Calculated: 24.02 BSA Calculated: 1.91 Blood Pressure: 155 / 69 Temperature: 98.1 F Heart Rate: 47  Physical Exam Constitutional: Well nourished and well developed . No acute distress. The patient appears well hydrated.  ENT:. The ears and nose are normal in appearance.  Neck: The appearance of the neck is normal.  Pulmonary: No respiratory distress.  Cardiovascular: Heart rate and rhythm are normal.  Abdomen: The abdomen is flat. The abdomen is soft and nontender. No suprapubic tenderness.  Skin: Normal skin turgor and normal skin color and pigmentation.  Neuro/Psych:. Mood and affect are appropriate.    Results/Data Urine [Data Includes: Last 1 Day]   25Sep2015  COLOR YELLOW   APPEARANCE CLEAR   SPECIFIC GRAVITY 1.010   pH 5.5   GLUCOSE NEG mg/dL  BILIRUBIN NEG   KETONE NEG mg/dL  BLOOD TRACE   PROTEIN NEG mg/dL  UROBILINOGEN 0.2 mg/dL  NITRITE NEG   LEUKOCYTE ESTERASE NEG   SQUAMOUS EPITHELIAL/HPF RARE   WBC 0-2 WBC/hpf  RBC 0-2 RBC/hpf  BACTERIA NONE SEEN   CRYSTALS NONE SEEN   CASTS NONE SEEN    The following clinical lab reports were reviewed:  UA- negative.    Assessment Assessed  1. 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 Preop  examination (V72.84)  Plan Health Maintenance  1. UA With REFLEX; [Do Not Release]; Status:Resulted - Requires Verification;   Done:  [id number] 09:51AM  Cleared to proceed with elective surgical procedure as scheduled.

## 2014-07-17 ENCOUNTER — Encounter (HOSPITAL_COMMUNITY)
Admission: RE | Disposition: A | Discharge status: Home / Self Care | Payer: Self-pay | Source: Ambulatory Visit | Attending: Urology

## 2014-07-17 ENCOUNTER — Encounter (HOSPITAL_COMMUNITY): Payer: Medicare Other | Admitting: Anesthesiology

## 2014-07-17 ENCOUNTER — Ambulatory Visit (HOSPITAL_COMMUNITY)
Admission: RE | Admit: 2014-07-17 | Discharge: 2014-07-17 | Disposition: A | Discharge status: Home / Self Care | Payer: Medicare Other | Source: Ambulatory Visit | Attending: Urology | Admitting: Urology

## 2014-07-17 ENCOUNTER — Ambulatory Visit (HOSPITAL_COMMUNITY): Payer: Medicare Other | Admitting: Anesthesiology

## 2014-07-17 ENCOUNTER — Encounter (HOSPITAL_COMMUNITY): Payer: Self-pay | Admitting: *Deleted

## 2014-07-17 DIAGNOSIS — Z888 Allergy status to other drugs, medicaments and biological substances status: Secondary | ICD-10-CM | POA: Diagnosis not present

## 2014-07-17 DIAGNOSIS — M109 Gout, unspecified: Secondary | ICD-10-CM | POA: Diagnosis not present

## 2014-07-17 DIAGNOSIS — Z955 Presence of coronary angioplasty implant and graft: Secondary | ICD-10-CM | POA: Insufficient documentation

## 2014-07-17 DIAGNOSIS — F419 Anxiety disorder, unspecified: Secondary | ICD-10-CM | POA: Diagnosis not present

## 2014-07-17 DIAGNOSIS — I252 Old myocardial infarction: Secondary | ICD-10-CM | POA: Diagnosis not present

## 2014-07-17 DIAGNOSIS — N433 Hydrocele, unspecified: Secondary | ICD-10-CM

## 2014-07-17 DIAGNOSIS — K219 Gastro-esophageal reflux disease without esophagitis: Secondary | ICD-10-CM | POA: Diagnosis not present

## 2014-07-17 DIAGNOSIS — Z886 Allergy status to analgesic agent status: Secondary | ICD-10-CM | POA: Diagnosis not present

## 2014-07-17 DIAGNOSIS — I10 Essential (primary) hypertension: Secondary | ICD-10-CM | POA: Diagnosis not present

## 2014-07-17 DIAGNOSIS — Z79899 Other long term (current) drug therapy: Secondary | ICD-10-CM | POA: Insufficient documentation

## 2014-07-17 HISTORY — PX: HYDROCELE EXCISION: SHX482

## 2014-07-17 SURGERY — HYDROCELECTOMY
Anesthesia: General | Laterality: Left

## 2014-07-17 MED ORDER — BUPIVACAINE-EPINEPHRINE (PF) 0.25% -1:200000 IJ SOLN
INTRAMUSCULAR | Status: AC
  Filled 2014-07-17: qty 30

## 2014-07-17 MED ORDER — PROPOFOL 10 MG/ML IV BOLUS
INTRAVENOUS | Status: AC
  Filled 2014-07-17: qty 20

## 2014-07-17 MED ORDER — LACTATED RINGERS IV SOLN
INTRAVENOUS | Status: DC | PRN
  Administered 2014-07-17: 07:00:00 via INTRAVENOUS

## 2014-07-17 MED ORDER — CEFAZOLIN SODIUM-DEXTROSE 2-3 GM-% IV SOLR
2.0000 g | INTRAVENOUS | Status: AC
  Administered 2014-07-17: 2 g via INTRAVENOUS

## 2014-07-17 MED ORDER — FENTANYL CITRATE 0.05 MG/ML IJ SOLN
INTRAMUSCULAR | Status: DC | PRN
  Administered 2014-07-17: 50 ug via INTRAVENOUS

## 2014-07-17 MED ORDER — BUPIVACAINE HCL (PF) 0.25 % IJ SOLN
INTRAMUSCULAR | Status: DC | PRN
  Administered 2014-07-17: 14 mL

## 2014-07-17 MED ORDER — ATROPINE SULFATE 0.4 MG/ML IJ SOLN
INTRAMUSCULAR | Status: DC | PRN
  Administered 2014-07-17: 0.2 mg via INTRAVENOUS

## 2014-07-17 MED ORDER — MIDAZOLAM HCL 2 MG/2ML IJ SOLN
INTRAMUSCULAR | Status: AC
  Filled 2014-07-17: qty 2

## 2014-07-17 MED ORDER — EPHEDRINE SULFATE 50 MG/ML IJ SOLN
INTRAMUSCULAR | Status: AC
  Filled 2014-07-17: qty 1

## 2014-07-17 MED ORDER — CEFAZOLIN SODIUM-DEXTROSE 2-3 GM-% IV SOLR
INTRAVENOUS | Status: AC
  Filled 2014-07-17: qty 50

## 2014-07-17 MED ORDER — FENTANYL CITRATE 0.05 MG/ML IJ SOLN: 25.0000 ug | INTRAMUSCULAR | Status: DC | PRN

## 2014-07-17 MED ORDER — LIDOCAINE HCL (CARDIAC) 20 MG/ML IV SOLN
INTRAVENOUS | Status: AC
  Filled 2014-07-17: qty 5

## 2014-07-17 MED ORDER — ATROPINE SULFATE 0.4 MG/ML IJ SOLN
INTRAMUSCULAR | Status: AC
  Filled 2014-07-17: qty 1

## 2014-07-17 MED ORDER — FENTANYL CITRATE 0.05 MG/ML IJ SOLN
INTRAMUSCULAR | Status: AC
  Filled 2014-07-17: qty 2

## 2014-07-17 MED ORDER — EPHEDRINE SULFATE 50 MG/ML IJ SOLN
INTRAMUSCULAR | Status: DC | PRN
  Administered 2014-07-17: 7.5 mg via INTRAVENOUS
  Administered 2014-07-17: 5 mg via INTRAVENOUS

## 2014-07-17 MED ORDER — PROPOFOL 10 MG/ML IV BOLUS
INTRAVENOUS | Status: DC | PRN
  Administered 2014-07-17: 160 mg via INTRAVENOUS
  Administered 2014-07-17: 40 mg via INTRAVENOUS

## 2014-07-17 MED ORDER — GLYCOPYRROLATE 0.2 MG/ML IJ SOLN
INTRAMUSCULAR | Status: DC | PRN
  Administered 2014-07-17: 0.2 mg via INTRAVENOUS

## 2014-07-17 MED ORDER — BUPIVACAINE HCL (PF) 0.25 % IJ SOLN
INTRAMUSCULAR | Status: AC
  Filled 2014-07-17: qty 30

## 2014-07-17 MED ORDER — GLYCOPYRROLATE 0.2 MG/ML IJ SOLN
INTRAMUSCULAR | Status: AC
  Filled 2014-07-17: qty 1

## 2014-07-17 MED ORDER — LIDOCAINE HCL 1 % IJ SOLN
INTRAMUSCULAR | Status: DC | PRN
  Administered 2014-07-17: 50 mg via INTRADERMAL

## 2014-07-17 MED ORDER — OXYCODONE-ACETAMINOPHEN 5-325 MG PO TABS: 1.0000 | ORAL_TABLET | ORAL | Status: DC | PRN

## 2014-07-17 MED ORDER — ONDANSETRON HCL 4 MG/2ML IJ SOLN
INTRAMUSCULAR | Status: DC | PRN
  Administered 2014-07-17: 4 mg via INTRAVENOUS

## 2014-07-17 MED ORDER — 0.9 % SODIUM CHLORIDE (POUR BTL) OPTIME
TOPICAL | Status: DC | PRN
  Administered 2014-07-17: 1000 mL

## 2014-07-17 MED ORDER — OXYCODONE-ACETAMINOPHEN 5-325 MG PO TABS
1.0000 | ORAL_TABLET | ORAL | Status: DC | PRN
  Administered 2014-07-17: 1 via ORAL
  Filled 2014-07-17: qty 1

## 2014-07-17 SURGICAL SUPPLY — 22 items
BNDG GAUZE ELAST 4 BULKY (GAUZE/BANDAGES/DRESSINGS) ×2 IMPLANT
CATH URET 5FR 28IN OPEN ENDED (CATHETERS) IMPLANT
COVER SURGICAL LIGHT HANDLE (MISCELLANEOUS) ×2 IMPLANT
DRAIN PENROSE 18X1/4 LTX STRL (WOUND CARE) ×2 IMPLANT
DRAPE LAPAROTOMY T 102X78X121 (DRAPES) ×2 IMPLANT
DRSG KUZMA FLUFF (GAUZE/BANDAGES/DRESSINGS) ×2 IMPLANT
ELECT REM PT RETURN 9FT ADLT (ELECTROSURGICAL) ×2
ELECTRODE REM PT RTRN 9FT ADLT (ELECTROSURGICAL) ×1 IMPLANT
GAUZE SPONGE 4X4 16PLY XRAY LF (GAUZE/BANDAGES/DRESSINGS) ×2 IMPLANT
GLOVE SURG SS PI 8.0 STRL IVOR (GLOVE) ×2 IMPLANT
GOWN STRL REUS W/TWL XL LVL3 (GOWN DISPOSABLE) ×4 IMPLANT
KIT BASIN OR (CUSTOM PROCEDURE TRAY) ×2 IMPLANT
NEEDLE HYPO 22GX1.5 SAFETY (NEEDLE) IMPLANT
NS IRRIG 1000ML POUR BTL (IV SOLUTION) ×2 IMPLANT
PACK GENERAL/GYN (CUSTOM PROCEDURE TRAY) ×2 IMPLANT
SPONGE LAP 4X18 X RAY DECT (DISPOSABLE) ×4 IMPLANT
SUPPORT SCROTAL LG STRP (MISCELLANEOUS) IMPLANT
SUPPORT SCROTAL MED ADLT STRP (MISCELLANEOUS) IMPLANT
SUT CHROMIC 3 0 SH 27 (SUTURE) ×6 IMPLANT
SUT CHROMIC 4 0 RB 1X27 (SUTURE) IMPLANT
SYR CONTROL 10ML LL (SYRINGE) IMPLANT
WATER STERILE IRR 1500ML POUR (IV SOLUTION) IMPLANT

## 2014-07-17 NOTE — Transfer of Care (Signed)
Immediate Anesthesia Transfer of Care Note  Patient: Matthew Liu  Procedure(s) Performed: Procedure(s): LEFT HYDROCELECTOMY (Left)  Patient Location: PACU  Anesthesia Type:General  Level of Consciousness: awake, alert  and patient cooperative  Airway & Oxygen Therapy: Patient Spontanous Breathing and Patient connected to face mask oxygen  Post-op Assessment: Report given to PACU RN and Post -op Vital signs reviewed and stable  Post vital signs: Reviewed and stable  Complications: No apparent anesthesia complications

## 2014-07-17 NOTE — Discharge Instructions (Signed)
Discharge instructions following scrotal surgery  Call your doctor for:  Fever is greater than 100.5  Severe nausea or vomiting  Increasing pain not controlled by pain medication  Increasing redness or drainage from incisions  The number for questions or concerns is [phone]  Activity level: No lifting greater than 10 pounds (about equal to milk) for the next 2 weeks or until cleared to do so at follow-up appointment.  Otherwise activity as tolerated by comfort level.  Diet: May resume your regular diet as tolerated  Driving: No driving while still taking opiate pain medications (weight at least 6-8 hours after last dose).  No driving if you still sore from surgery as it may limit her ability to react quickly if necessary.   Shower/bath: May shower and get incision wet pad dry immediately following.  Do not scrub vigorously for the next 2-3 weeks.  Do not soak incision (ID soaking in bath or swimming) until told he may do so by Dr., as this may promote a wound infection.  Wound care: He may cover wounds with sterile gauze as needed to prevent incisions rubbing on close follow-up in any seepage.  Where tight fitting underpants for at least 2 weeks.  He should apply cold compresses (ice or sac of frozen peas/corn) to your scrotum for at least 48 hours to reduce the swelling.  You should expect that his scrotum will swell up initially and then get smaller over the next 2-4 weeks.  Follow-up appointments: Follow-up appointment as scheduled

## 2014-07-17 NOTE — Anesthesia Preprocedure Evaluation (Signed)
Anesthesia Evaluation  Patient identified by MRN, date of birth, ID band Patient awake    Reviewed: Allergy & Precautions, H&P , NPO status , Patient's Chart, lab work & pertinent test results  Airway Mallampati: II TM Distance: >3 FB Neck ROM: Full    Dental no notable dental hx.    Pulmonary neg pulmonary ROS,  breath sounds clear to auscultation  Pulmonary exam normal       Cardiovascular hypertension, Pt. on home beta blockers + CAD and + Past MI Rhythm:Regular Rate:Normal     Neuro/Psych Anxiety negative neurological ROS     GI/Hepatic Neg liver ROS, GERD-  Medicated,  Endo/Other  negative endocrine ROS  Renal/GU negative Renal ROS  negative genitourinary   Musculoskeletal negative musculoskeletal ROS (+)   Abdominal   Peds negative pediatric ROS (+)  Hematology negative hematology ROS (+)   Anesthesia Other Findings   Reproductive/Obstetrics negative OB ROS                           Anesthesia Physical Anesthesia Plan  ASA: III  Anesthesia Plan: General   Post-op Pain Management:    Induction: Intravenous  Airway Management Planned: LMA  Additional Equipment:   Intra-op Plan:   Post-operative Plan: Extubation in OR  Informed Consent: I have reviewed the patients History and Physical, chart, labs and discussed the procedure including the risks, benefits and alternatives for the proposed anesthesia with the patient or authorized representative who has indicated his/her understanding and acceptance.   Dental advisory given  Plan Discussed with: CRNA  Anesthesia Plan Comments:         Anesthesia Quick Evaluation

## 2014-07-17 NOTE — Interval H&P Note (Signed)
History and Physical Interval Note:  07/17/2014 7:23 AM  Matthew Liu  has presented today for surgery, with the diagnosis of LEFT HYDROCELE   The various methods of treatment have been discussed with the patient and family. After consideration of risks, benefits and other options for treatment, the patient has consented to  Procedure(s): LEFT HYDROCELECTOMY (Left) as a surgical intervention .  The patient's history has been reviewed, patient examined, no change in status, stable for surgery.  I have reviewed the patient's chart and labs.  Questions were answered to the patient's satisfaction.     Reve Crocket J

## 2014-07-17 NOTE — Op Note (Signed)
Preoperative diagnosis:  1. Left hydrocele  Postoperative diagnosis: 1. Same  Procedure(s): 1. Left hydrocelectomy  Surgeon: Dr. Irine Seal  Assit: Dr. Amaryllis Dyke  Anesthesia: General  Complications: None  EBL: Minimal  Specimens: None  Intraoperative findings: ~300 cc left hydrocele. Healthy appearing testicle  Indication: Enlarging, bothersome left hydrocele. Patient elected hydrocelectomy after risks, benefits, and alternatives thoroughly discussed. Risks include but not limited to post-operative hematoma, infection, reaccumulation, pain, testicular atrophy, poor cosmetic result. Informed consent signed and he agreed to proceed.  Description of procedure:  The patient, consent and site were verified prior to bringing them to the OR where they were placed supine on the OR table. SCDs were placed and IV ancef was initiated. General anesthesia was induced and the patient was clipped, prepped and draped in the usual sterile fashion. Pre-procedure time out was called and the case was begun.  A horizontal scrotal incision was made over the left hemiscrotum with the #15 blade. The hydrocele was dissected out using the bovie and blunt dissection and the hydrocele was delivered. The hydrocele was opened and the fluid was irrigated out. The excess sac was incised and hemostasis was obtained. The sac was wrapped around posterior to the testicle and cord and closed snuggly but not too tight to cut off circulation to the testicle with running locking 3-0 chromic. A cord block was performed. Again, hemostasis was meticulously obtained in the scrotum/dartos and a penrose drain was placed in the dependent scrotum. The dartos layer was closed with a running 3-0 chromic. Skin was closed with interrupted 3-0 chromic vertical mattress sutures and additional local anesthetic was injected.   The patient was then awoken from general anesthesia having tolerated the procedure well.

## 2014-07-17 NOTE — Anesthesia Postprocedure Evaluation (Signed)
  Anesthesia Post-op Note  Patient: Matthew Liu  Procedure(s) Performed: Procedure(s) (LRB): LEFT HYDROCELECTOMY (Left)  Patient Location: PACU  Anesthesia Type: General  Level of Consciousness: awake and alert   Airway and Oxygen Therapy: Patient Spontanous Breathing  Post-op Pain: mild  Post-op Assessment: Post-op Vital signs reviewed, Patient's Cardiovascular Status Stable, Respiratory Function Stable, Patent Airway and No signs of Nausea or vomiting  Last Vitals:  Filed Vitals:   07/17/14 1050  BP: 126/64  Pulse:   Temp:   Resp: 16    Post-op Vital Signs: stable   Complications: No apparent anesthesia complications

## 2014-07-18 ENCOUNTER — Encounter (HOSPITAL_COMMUNITY): Payer: Self-pay | Admitting: Urology

## 2014-07-21 ENCOUNTER — Other Ambulatory Visit (HOSPITAL_COMMUNITY): Payer: Self-pay | Admitting: Physician Assistant

## 2014-07-24 ENCOUNTER — Other Ambulatory Visit (HOSPITAL_COMMUNITY): Payer: Self-pay | Admitting: Interventional Cardiology

## 2014-09-16 ENCOUNTER — Encounter: Payer: Self-pay | Admitting: Cardiovascular Disease

## 2014-09-18 ENCOUNTER — Encounter (HOSPITAL_COMMUNITY): Payer: Self-pay | Admitting: Interventional Cardiology

## 2014-11-08 ENCOUNTER — Other Ambulatory Visit: Payer: Self-pay | Admitting: Interventional Cardiology

## 2014-11-14 ENCOUNTER — Other Ambulatory Visit: Payer: Self-pay

## 2014-11-14 MED ORDER — METOPROLOL TARTRATE 25 MG PO TABS: 25.0000 mg | ORAL_TABLET | Freq: Two times a day (BID) | ORAL | Status: DC

## 2014-12-01 ENCOUNTER — Encounter: Payer: Self-pay | Admitting: Interventional Cardiology

## 2014-12-01 ENCOUNTER — Ambulatory Visit (INDEPENDENT_AMBULATORY_CARE_PROVIDER_SITE_OTHER): Payer: Medicare Other | Admitting: Interventional Cardiology

## 2014-12-01 VITALS — BP 142/64 | HR 53 | Ht 69.5 in | Wt 168.6 lb

## 2014-12-01 DIAGNOSIS — N5201 Erectile dysfunction due to arterial insufficiency: Secondary | ICD-10-CM

## 2014-12-01 DIAGNOSIS — E785 Hyperlipidemia, unspecified: Secondary | ICD-10-CM

## 2014-12-01 DIAGNOSIS — I10 Essential (primary) hypertension: Secondary | ICD-10-CM

## 2014-12-01 DIAGNOSIS — N529 Male erectile dysfunction, unspecified: Secondary | ICD-10-CM | POA: Insufficient documentation

## 2014-12-01 DIAGNOSIS — I2510 Atherosclerotic heart disease of native coronary artery without angina pectoris: Secondary | ICD-10-CM

## 2014-12-01 MED ORDER — METOPROLOL TARTRATE 25 MG PO TABS
12.5000 mg | ORAL_TABLET | Freq: Two times a day (BID) | ORAL | Status: DC
Start: 2014-12-01 — End: 2015-09-14

## 2014-12-01 NOTE — Patient Instructions (Signed)
Your physician has recommended you make the following change in your medication:  1) DECREASE Metoprolol to 12.5mg  twice daily Then STOP  Your physician discussed the importance of regular exercise and recommended that you start or continue a regular exercise program for good health.   Your physician recommends that you schedule a follow-up appointment in: 6-8 weeks

## 2014-12-01 NOTE — Progress Notes (Signed)
Cardiology Office Note   Date:  12/01/2014   ID:  Matthew Liu, DOB 1941/08/12, MRN [id number]  PCP:  Nicoletta Dress, MD  Cardiologist:   Sinclair Grooms, MD   No chief complaint on file.     History of Present Illness: Matthew Liu is a 74 y.o. male who presents for CAD and has been psychologically/mentally withdrawn. He denies chest discomfort. The wife in here concerned if he is unable to get an erection. He also states that he has no desire to have sexual relations.    Past Medical History  Diagnosis Date  . Hypertension   . CAD (coronary artery disease)     a. Inf STEMI 07/2013: s/p DES x 2 from ostial to [facility] 07/13/13. Residual Cx disease for med rx (PCI if recurrent pain).  . Aspirin allergy     a. Anaphylactic rxn.  [name] Dyslipidemia   . GERD (gastroesophageal reflux disease)   . Barrett's esophagus   . Hyperglycemia     a. A1C 5.8 in 07/2013.  [name] Myocardial infarction   . Anxiety   . ED (erectile dysfunction)   . Hydrocele of testis     Past Surgical History  Procedure Laterality Date  . Left ankle surgery    . Coronary angioplasty      stents   . Cataract surgery       bilateral   . Hydrocele excision Left 07/17/2014    Procedure: LEFT HYDROCELECTOMY;  Surgeon: Malka So, MD;  Location: [facility];  Service: Urology;  Laterality: Left;  . Left heart catheterization with coronary angiogram N/A 07/13/2013    Procedure: LEFT HEART CATHETERIZATION WITH CORONARY ANGIOGRAM;  Surgeon: Sinclair Grooms, MD;  Location: [facility] CATH LAB;  Service: Cardiovascular;  Laterality: N/A;  . Percutaneous coronary stent intervention (pci-s)  07/13/2013    Procedure: PERCUTANEOUS CORONARY STENT INTERVENTION (PCI-S);  Surgeon: Sinclair Grooms, MD;  Location: [facility] CATH LAB;  Service: Cardiovascular;;     Current Outpatient Prescriptions  Medication Sig Dispense Refill  . acetaminophen (TYLENOL) 500 MG tablet Take 500 mg by mouth once as needed for headache.    . allopurinol  (ZYLOPRIM) 300 MG tablet Take 300 mg by mouth every morning.     [name] ALPRAZolam (XANAX) 0.25 MG tablet Take 0.25 mg by mouth 3 (three) times daily as needed for anxiety.     [name] atorvastatin (LIPITOR) 80 MG tablet TAKE 1 TABLET BY MOUTH EVERY EVENING (Patient taking differently: TAKE 1/2 TABLET BY MOUTH EVERY EVENING) 30 tablet 5  . clopidogrel (PLAVIX) 75 MG tablet Take 75 mg by mouth daily.  11  . metoprolol tartrate (LOPRESSOR) 25 MG tablet Take 1 tablet (25 mg total) by mouth 2 (two) times daily. 60 tablet 2  . Multiple Vitamins-Minerals (SENIOR MULTIVITAMIN PLUS PO) Take 1 tablet by mouth every morning.     . nitroGLYCERIN (NITROSTAT) 0.4 MG SL tablet Place 1 tablet (0.4 mg total) under the tongue every 5 (five) minutes as needed for chest pain (up to 3 doses). 25 tablet 4  . omeprazole (PRILOSEC) 40 MG capsule Take 40 mg by mouth every morning. Before breakfast.    . quinapril (ACCUPRIL) 40 MG tablet Take 40 mg by mouth every evening.      No current facility-administered medications for this visit.    Allergies:   Aspirin and Quinine derivatives    Social History:  The patient  reports that he has never smoked. He has  never used smokeless tobacco. He reports that he drinks alcohol. He reports that he does not use illicit drugs.   Family History:  The patient's family history includes Hypertension in his mother.    ROS:  Please see the history of present illness.   Otherwise, review of systems are positive for withdrawn personality, change in affect according to the wife, and increased lethargy.   All other systems are reviewed and negative.    PHYSICAL EXAM: VS:  BP 142/64 mmHg  Pulse 53  Ht 5' 9.5" (1.765 m)  Wt 168 lb 9.6 oz (76.476 kg)  BMI 24.55 kg/m2  SpO2 98% , BMI Body mass index is 24.55 kg/(m^2). GEN: Well nourished, well developed, in no acute distress HEENT: normal Neck: no JVD, carotid bruits, or masses Cardiac: RRR; no murmurs, rubs, or gallops,no edema    Respiratory:  clear to auscultation bilaterally, normal work of breathing GI: soft, nontender, nondistended, + BS MS: no deformity or atrophy Skin: warm and dry, no rash Neuro:  Strength and sensation are intact Psych: euthymic mood, full affect   EKG:  EKG is not ordered today.    Recent Labs: 07/09/2014: BUN 17; Creatinine 0.92; Hemoglobin 13.5; Platelets 158; Potassium 4.6; Sodium 138    Lipid Panel    Component Value Date/Time   CHOL 104 08/27/2013 1037   TRIG 73.0 08/27/2013 1037   HDL 44.30 08/27/2013 1037   CHOLHDL 2 08/27/2013 1037   VLDL 14.6 08/27/2013 1037   LDLCALC 45 08/27/2013 1037      Wt Readings from Last 3 Encounters:  12/01/14 168 lb 9.6 oz (76.476 kg)  07/17/14 167 lb (75.751 kg)  06/09/14 166 lb (75.297 kg)      Other studies Reviewed: Additional studies/ records that were reviewed today include: .    ASSESSMENT AND PLAN:  1.  Coronary artery disease with right coronary stent and history of known circumflex obstruction that is asymptomatic and with a normal nuclear study of 2015. 2. Hyperlipidemia 3. Essential hypertension, borderline controlled 4. Erectile dysfunction   Current medicines are reviewed at length with the patient today.  The patient has concerns regarding medicines.  The following changes have been made:  Wean and discontinue metoprolol. He will then return in 6-8 weeks for reassessment of erectile function, overall affect, and blood pressure. We may need to use a PDE 5 inhibitor if ED continues.  Labs/ tests ordered today include:  No orders of the defined types were placed in this encounter.     Disposition:   FU with Linard Millers in 2 months   Signed, Sinclair Grooms, MD  12/01/2014 1:57 PM    [facility] [place], [place], [place]  [address] Phone: [phone]; Fax: [phone]

## 2015-01-23 ENCOUNTER — Ambulatory Visit (INDEPENDENT_AMBULATORY_CARE_PROVIDER_SITE_OTHER): Payer: Medicare Other | Admitting: Interventional Cardiology

## 2015-01-23 ENCOUNTER — Encounter: Payer: Self-pay | Admitting: Interventional Cardiology

## 2015-01-23 VITALS — BP 170/80 | HR 69 | Ht 69.5 in | Wt 165.1 lb

## 2015-01-23 DIAGNOSIS — I10 Essential (primary) hypertension: Secondary | ICD-10-CM | POA: Diagnosis not present

## 2015-01-23 DIAGNOSIS — I2510 Atherosclerotic heart disease of native coronary artery without angina pectoris: Secondary | ICD-10-CM

## 2015-01-23 DIAGNOSIS — N5201 Erectile dysfunction due to arterial insufficiency: Secondary | ICD-10-CM

## 2015-01-23 DIAGNOSIS — E785 Hyperlipidemia, unspecified: Secondary | ICD-10-CM

## 2015-01-23 NOTE — Patient Instructions (Addendum)
Medication Instructions:  Your physician has recommended you make the following change in your medication:  1. RESUME Metoprolol Tartrate 25mg  take one-half tablet by mouth twice a day 2. Per Dr Tamala Julian you are okay to take Viagra  Labwork: No new orders.  Testing/Procedures: No new orders.  Follow-Up: Your physician wants you to follow-up in: 1 YEAR with Dr Tamala Julian. You will receive a reminder letter in the mail two months in advance. If you don't receive a letter, please call our office to schedule the follow-up appointment.  Any Other Special Instructions Will Be Listed Below (If Applicable).  Dr Tamala Julian recommends that you speak with your Primary Care physician about a referral to Urology.

## 2015-01-23 NOTE — Progress Notes (Signed)
Cardiology Office Note   Date:  01/23/2015   ID:  Matthew Liu, DOB 03-21-1941, MRN [id number]  PCP:  Nicoletta Dress, MD  Cardiologist:   Sinclair Grooms, MD   Chief Complaint  Patient presents with  . Coronary Artery Disease      History of Present Illness: Matthew Liu is a 74 y.o. male who presents for CAD, Hypertension, ED, and old MI.  Since being off metoprolol there is been no change in his ability to attain or maintain an erection. He has not had any cardiac symptoms. He states the part of the problem has to do with desire and being on a different rhythm than his wife. He is a nighttime person and she is a morning person or vice versa.    Past Medical History  Diagnosis Date  . Hypertension   . CAD (coronary artery disease)     a. Inf STEMI 07/2013: s/p DES x 2 from ostial to [facility] 07/13/13. Residual Cx disease for med rx (PCI if recurrent pain).  . Aspirin allergy     a. Anaphylactic rxn.  [name] Dyslipidemia   . GERD (gastroesophageal reflux disease)   . Barrett's esophagus   . Hyperglycemia     a. A1C 5.8 in 07/2013.  [name] Myocardial infarction   . Anxiety   . ED (erectile dysfunction)   . Hydrocele of testis     Past Surgical History  Procedure Laterality Date  . Left ankle surgery    . Coronary angioplasty      stents   . Cataract surgery       bilateral   . Hydrocele excision Left 07/17/2014    Procedure: LEFT HYDROCELECTOMY;  Surgeon: Malka So, MD;  Location: [facility];  Service: Urology;  Laterality: Left;  . Left heart catheterization with coronary angiogram N/A 07/13/2013    Procedure: LEFT HEART CATHETERIZATION WITH CORONARY ANGIOGRAM;  Surgeon: Sinclair Grooms, MD;  Location: [facility] CATH LAB;  Service: Cardiovascular;  Laterality: N/A;  . Percutaneous coronary stent intervention (pci-s)  07/13/2013    Procedure: PERCUTANEOUS CORONARY STENT INTERVENTION (PCI-S);  Surgeon: Sinclair Grooms, MD;  Location: [facility] CATH LAB;  Service: Cardiovascular;;      Current Outpatient Prescriptions  Medication Sig Dispense Refill  . acetaminophen (TYLENOL) 500 MG tablet Take 500 mg by mouth once as needed for headache.    . allopurinol (ZYLOPRIM) 300 MG tablet Take 300 mg by mouth every morning.     [name] ALPRAZolam (XANAX) 0.25 MG tablet Take 0.25 mg by mouth 3 (three) times daily as needed for anxiety.     [name] atorvastatin (LIPITOR) 80 MG tablet TAKE 1 TABLET BY MOUTH EVERY EVENING (Patient taking differently: TAKE 1/2 TABLET BY MOUTH EVERY EVENING) 30 tablet 5  . clopidogrel (PLAVIX) 75 MG tablet Take 75 mg by mouth daily.  11  . Multiple Vitamins-Minerals (SENIOR MULTIVITAMIN PLUS PO) Take 1 tablet by mouth every morning.     . nitroGLYCERIN (NITROSTAT) 0.4 MG SL tablet Place 1 tablet (0.4 mg total) under the tongue every 5 (five) minutes as needed for chest pain (up to 3 doses). 25 tablet 4  . omeprazole (PRILOSEC) 40 MG capsule Take 40 mg by mouth every morning. Before breakfast.    . quinapril (ACCUPRIL) 40 MG tablet Take 40 mg by mouth every evening.     . metoprolol tartrate (LOPRESSOR) 25 MG tablet Take 0.5 tablets (12.5 mg total) by mouth 2 (two) times daily. (  Patient not taking: Reported on 01/23/2015)     No current facility-administered medications for this visit.    Allergies:   Aspirin and Quinine derivatives    Social History:  The patient  reports that he has never smoked. He has never used smokeless tobacco. He reports that he drinks alcohol. He reports that he does not use illicit drugs.   Family History:  The patient's family history includes Hypertension in his mother.    ROS:  Please see the history of present illness.   Otherwise, review of systems are positive for none.   All other systems are reviewed and negative.    PHYSICAL EXAM: VS:  BP 170/80 mmHg  Pulse 69  Ht 5' 9.5" (1.765 m)  Wt 165 lb 1.9 oz (74.898 kg)  BMI 24.04 kg/m2 , BMI Body mass index is 24.04 kg/(m^2). GEN: Well nourished, well developed, in no acute  distress HEENT: normal Neck: no JVD, carotid bruits, or masses Cardiac: RRR; no murmurs, rubs, or gallops,no edema  Respiratory:  clear to auscultation bilaterally, normal work of breathing GI: soft, nontender, nondistended, + BS MS: no deformity or atrophy Skin: warm and dry, no rash Neuro:  Strength and sensation are intact Psych: euthymic mood, full affect   EKG:  EKG is not ordered today.    Recent Labs: 07/09/2014: BUN 17; Creatinine 0.92; Hemoglobin 13.5; Platelets 158; Potassium 4.6; Sodium 138    Lipid Panel    Component Value Date/Time   CHOL 104 08/27/2013 1037   TRIG 73.0 08/27/2013 1037   HDL 44.30 08/27/2013 1037   CHOLHDL 2 08/27/2013 1037   VLDL 14.6 08/27/2013 1037   LDLCALC 45 08/27/2013 1037      Wt Readings from Last 3 Encounters:  01/23/15 165 lb 1.9 oz (74.898 kg)  12/01/14 168 lb 9.6 oz (76.476 kg)  07/17/14 167 lb (75.751 kg)      Other studies Reviewed: Additional studies/ records that were reviewed today include: None.    ASSESSMENT AND PLAN:  Erectile dysfunction due to arterial insufficiency: As anticipated, there has been no improvement off beta blocker therapy. This had to be done because the patient was insistent that this was the source. I've advised him that it is safe to use a PDE 5 inhibitor.  CAD in native artery: Despite being on a beta blocker therapy, he has had no angina.  Essential hypertension: His blood pressure is elevated off beta blocker therapy. We will resume his full dose per blocker regimen as previous, 25 mg by mouth twice a day  Dyslipidemia  Current medicines are reviewed at length with the patient today.  The patient has concerns regarding medicines.  The following changes have been made:  Resume metoprolol   Labs/ tests ordered today include:  No orders of the defined types were placed in this encounter.     Disposition:   FU with Linard Millers in 12  months   Signed, Sinclair Grooms, MD  01/23/2015  10:38 AM    [facility] [place], [place], [place]  [address] Phone: [phone]; Fax: [phone]

## 2015-07-02 ENCOUNTER — Encounter: Payer: Self-pay | Admitting: Interventional Cardiology

## 2015-07-06 ENCOUNTER — Other Ambulatory Visit: Payer: Self-pay | Admitting: Interventional Cardiology

## 2015-07-08 ENCOUNTER — Other Ambulatory Visit: Payer: Self-pay | Admitting: Interventional Cardiology

## 2015-09-14 ENCOUNTER — Encounter (HOSPITAL_COMMUNITY): Payer: Self-pay | Admitting: Emergency Medicine

## 2015-09-14 ENCOUNTER — Inpatient Hospital Stay (HOSPITAL_COMMUNITY)
Admission: EM | Admit: 2015-09-14 | Discharge: 2015-09-16 | DRG: 065 | Disposition: A | Discharge status: Home Health | Payer: Medicare Other | Attending: Internal Medicine | Admitting: Internal Medicine

## 2015-09-14 ENCOUNTER — Emergency Department (HOSPITAL_COMMUNITY): Payer: Medicare Other

## 2015-09-14 ENCOUNTER — Observation Stay (HOSPITAL_COMMUNITY): Payer: Medicare Other

## 2015-09-14 DIAGNOSIS — E785 Hyperlipidemia, unspecified: Secondary | ICD-10-CM | POA: Diagnosis not present

## 2015-09-14 DIAGNOSIS — Z955 Presence of coronary angioplasty implant and graft: Secondary | ICD-10-CM

## 2015-09-14 DIAGNOSIS — Z9841 Cataract extraction status, right eye: Secondary | ICD-10-CM

## 2015-09-14 DIAGNOSIS — I2510 Atherosclerotic heart disease of native coronary artery without angina pectoris: Secondary | ICD-10-CM | POA: Diagnosis present

## 2015-09-14 DIAGNOSIS — K219 Gastro-esophageal reflux disease without esophagitis: Secondary | ICD-10-CM | POA: Diagnosis present

## 2015-09-14 DIAGNOSIS — S82892A Other fracture of left lower leg, initial encounter for closed fracture: Secondary | ICD-10-CM | POA: Diagnosis present

## 2015-09-14 DIAGNOSIS — Z889 Allergy status to unspecified drugs, medicaments and biological substances status: Secondary | ICD-10-CM | POA: Diagnosis not present

## 2015-09-14 DIAGNOSIS — Z7289 Other problems related to lifestyle: Secondary | ICD-10-CM

## 2015-09-14 DIAGNOSIS — R29701 NIHSS score 1: Secondary | ICD-10-CM | POA: Diagnosis present

## 2015-09-14 DIAGNOSIS — Z87892 Personal history of anaphylaxis: Secondary | ICD-10-CM

## 2015-09-14 DIAGNOSIS — G459 Transient cerebral ischemic attack, unspecified: Secondary | ICD-10-CM | POA: Diagnosis not present

## 2015-09-14 DIAGNOSIS — I639 Cerebral infarction, unspecified: Secondary | ICD-10-CM | POA: Diagnosis not present

## 2015-09-14 DIAGNOSIS — K2270 Barrett's esophagus without dysplasia: Secondary | ICD-10-CM | POA: Diagnosis present

## 2015-09-14 DIAGNOSIS — G451 Carotid artery syndrome (hemispheric): Secondary | ICD-10-CM | POA: Diagnosis not present

## 2015-09-14 DIAGNOSIS — R200 Anesthesia of skin: Secondary | ICD-10-CM | POA: Insufficient documentation

## 2015-09-14 DIAGNOSIS — G4733 Obstructive sleep apnea (adult) (pediatric): Secondary | ICD-10-CM | POA: Diagnosis present

## 2015-09-14 DIAGNOSIS — M6289 Other specified disorders of muscle: Secondary | ICD-10-CM | POA: Diagnosis not present

## 2015-09-14 DIAGNOSIS — Z7902 Long term (current) use of antithrombotics/antiplatelets: Secondary | ICD-10-CM

## 2015-09-14 DIAGNOSIS — I252 Old myocardial infarction: Secondary | ICD-10-CM

## 2015-09-14 DIAGNOSIS — I10 Essential (primary) hypertension: Secondary | ICD-10-CM | POA: Diagnosis present

## 2015-09-14 DIAGNOSIS — R531 Weakness: Secondary | ICD-10-CM | POA: Insufficient documentation

## 2015-09-14 DIAGNOSIS — Z8673 Personal history of transient ischemic attack (TIA), and cerebral infarction without residual deficits: Secondary | ICD-10-CM | POA: Diagnosis present

## 2015-09-14 DIAGNOSIS — I638 Other cerebral infarction: Principal | ICD-10-CM | POA: Diagnosis present

## 2015-09-14 DIAGNOSIS — Z888 Allergy status to other drugs, medicaments and biological substances status: Secondary | ICD-10-CM

## 2015-09-14 DIAGNOSIS — F419 Anxiety disorder, unspecified: Secondary | ICD-10-CM | POA: Diagnosis present

## 2015-09-14 DIAGNOSIS — R270 Ataxia, unspecified: Secondary | ICD-10-CM | POA: Diagnosis present

## 2015-09-14 DIAGNOSIS — G8194 Hemiplegia, unspecified affecting left nondominant side: Secondary | ICD-10-CM | POA: Diagnosis present

## 2015-09-14 DIAGNOSIS — Z9842 Cataract extraction status, left eye: Secondary | ICD-10-CM

## 2015-09-14 DIAGNOSIS — R739 Hyperglycemia, unspecified: Secondary | ICD-10-CM | POA: Diagnosis present

## 2015-09-14 DIAGNOSIS — D539 Nutritional anemia, unspecified: Secondary | ICD-10-CM | POA: Diagnosis present

## 2015-09-14 DIAGNOSIS — Z8249 Family history of ischemic heart disease and other diseases of the circulatory system: Secondary | ICD-10-CM

## 2015-09-14 DIAGNOSIS — Z886 Allergy status to analgesic agent status: Secondary | ICD-10-CM

## 2015-09-14 HISTORY — DX: Other fracture of left lower leg, initial encounter for closed fracture: S82.892A

## 2015-09-14 LAB — CBC WITH DIFFERENTIAL/PLATELET
BASOS PCT: 0 %
Basophils Absolute: 0 10*3/uL (ref 0.0–0.1)
EOS ABS: 0.4 10*3/uL (ref 0.0–0.7)
EOS PCT: 8 %
HCT: 38.7 % — ABNORMAL LOW (ref 39.0–52.0)
Hemoglobin: 12.9 g/dL — ABNORMAL LOW (ref 13.0–17.0)
Lymphocytes Relative: 34 %
Lymphs Abs: 1.5 10*3/uL (ref 0.7–4.0)
MCH: 33.6 pg (ref 26.0–34.0)
MCHC: 33.3 g/dL (ref 30.0–36.0)
MCV: 100.8 fL — ABNORMAL HIGH (ref 78.0–100.0)
MONO ABS: 0.4 10*3/uL (ref 0.1–1.0)
Monocytes Relative: 8 %
Neutro Abs: 2.3 10*3/uL (ref 1.7–7.7)
Neutrophils Relative %: 50 %
PLATELETS: 157 10*3/uL (ref 150–400)
RBC: 3.84 MIL/uL — AB (ref 4.22–5.81)
RDW: 13 % (ref 11.5–15.5)
WBC: 4.6 10*3/uL (ref 4.0–10.5)

## 2015-09-14 LAB — GLUCOSE, CAPILLARY: GLUCOSE-CAPILLARY: 145 mg/dL — AB (ref 65–99)

## 2015-09-14 LAB — BASIC METABOLIC PANEL
Anion gap: 7 (ref 5–15)
BUN: 12 mg/dL (ref 6–20)
CALCIUM: 8.8 mg/dL — AB (ref 8.9–10.3)
CO2: 26 mmol/L (ref 22–32)
Chloride: 108 mmol/L (ref 101–111)
Creatinine, Ser: 0.87 mg/dL (ref 0.61–1.24)
GFR calc Af Amer: >60 mL/min (ref >60)
GFR calc non Af Amer: >60 mL/min (ref >60)
GLUCOSE: 109 mg/dL — AB (ref 65–99)
Potassium: 4.1 mmol/L (ref 3.5–5.1)
Sodium: 141 mmol/L (ref 135–145)

## 2015-09-14 LAB — CBG MONITORING, ED: GLUCOSE-CAPILLARY: 113 mg/dL — AB (ref 65–99)

## 2015-09-14 LAB — TROPONIN I: Troponin I: <0.03 ng/mL (ref <0.031)

## 2015-09-14 MED ORDER — ATORVASTATIN CALCIUM 40 MG PO TABS
40.0000 mg | ORAL_TABLET | Freq: Every evening | ORAL | Status: DC
  Administered 2015-09-14 – 2015-09-15 (×2): 40 mg via ORAL
  Filled 2015-09-14 (×2): qty 1

## 2015-09-14 MED ORDER — QUINAPRIL HCL 10 MG PO TABS
40.0000 mg | ORAL_TABLET | Freq: Every evening | ORAL | Status: DC
  Administered 2015-09-14 – 2015-09-15 (×2): 40 mg via ORAL
  Filled 2015-09-14 (×3): qty 4

## 2015-09-14 MED ORDER — SENNOSIDES-DOCUSATE SODIUM 8.6-50 MG PO TABS: 1.0000 | ORAL_TABLET | Freq: Every evening | ORAL | Status: DC | PRN

## 2015-09-14 MED ORDER — CLOPIDOGREL BISULFATE 75 MG PO TABS
75.0000 mg | ORAL_TABLET | Freq: Every day | ORAL | Status: DC
  Administered 2015-09-15 – 2015-09-16 (×2): 75 mg via ORAL
  Filled 2015-09-14 (×2): qty 1

## 2015-09-14 MED ORDER — ALPRAZOLAM 0.25 MG PO TABS
0.2500 mg | ORAL_TABLET | Freq: Three times a day (TID) | ORAL | Status: DC | PRN
  Administered 2015-09-14 – 2015-09-15 (×2): 0.25 mg via ORAL
  Filled 2015-09-14 (×2): qty 1

## 2015-09-14 MED ORDER — STROKE: EARLY STAGES OF RECOVERY BOOK
Freq: Once | Status: AC
  Administered 2015-09-14: 21:00:00
  Filled 2015-09-14: qty 1

## 2015-09-14 MED ORDER — NITROGLYCERIN 0.4 MG SL SUBL: 0.4000 mg | SUBLINGUAL_TABLET | SUBLINGUAL | Status: DC | PRN

## 2015-09-14 MED ORDER — PANTOPRAZOLE SODIUM 40 MG PO TBEC
40.0000 mg | DELAYED_RELEASE_TABLET | Freq: Every day | ORAL | Status: DC
  Administered 2015-09-15 – 2015-09-16 (×2): 40 mg via ORAL
  Filled 2015-09-14 (×2): qty 1

## 2015-09-14 MED ORDER — METOPROLOL TARTRATE 25 MG PO TABS
25.0000 mg | ORAL_TABLET | Freq: Two times a day (BID) | ORAL | Status: DC
  Administered 2015-09-14 – 2015-09-16 (×4): 25 mg via ORAL
  Filled 2015-09-14 (×4): qty 1

## 2015-09-14 MED ORDER — ENOXAPARIN SODIUM 40 MG/0.4ML ~~LOC~~ SOLN
40.0000 mg | SUBCUTANEOUS | Status: DC
  Administered 2015-09-14 – 2015-09-15 (×2): 40 mg via SUBCUTANEOUS
  Filled 2015-09-14 (×2): qty 0.4

## 2015-09-14 MED ORDER — ALLOPURINOL 100 MG PO TABS
300.0000 mg | ORAL_TABLET | Freq: Every day | ORAL | Status: DC
  Administered 2015-09-15 – 2015-09-16 (×2): 300 mg via ORAL
  Filled 2015-09-14 (×2): qty 3

## 2015-09-14 NOTE — ED Notes (Signed)
Heart healthy meal tray ordered @ 17:35  

## 2015-09-14 NOTE — Progress Notes (Signed)
Received from ED;oriented patient to room and unit routine.

## 2015-09-14 NOTE — ED Notes (Signed)
Per [facility] patient was outside when he started feeling left sided numbness and tingling.  Patient states his left arm was was tingling and that he was unable to control his left leg.  Patient states the symptoms lasted approximately 30 minutes and have all since resolved.  Patient has a history of two MI's in 2014.  Patient in no apparent distress at this time.

## 2015-09-14 NOTE — ED Provider Notes (Signed)
CSN: [id number]     Arrival date & time 09/14/15  1346 History   First MD Initiated Contact with Patient 09/14/15 1351     Chief Complaint  Patient presents with  . Stroke Symptoms     (Consider location/radiation/quality/duration/timing/severity/associated sxs/prior Treatment) HPI Comments: 74 year old male with past medical history including CAD s/p MI, HTN, HLD, GERD who presents with left-sided numbness. The patient reports that approximately 30 minutes prior to arrival, he was driving when he had a sudden onset of left arm numbness and tingling. He states that it felt like he lost control of his left arm. When he got out of the car, he noticed a left leg weakness, feeling like he was unable to control his left leg. His symptoms have almost completely resolved although he does endorse slightly abnormal sensation of his leg when standing. He denies any headache, visual changes, chest pain, shortness of breath, vomiting, or recent illness. He has never had these symptoms before.  The history is provided by the patient.    Past Medical History  Diagnosis Date  . Hypertension   . CAD (coronary artery disease)     a. Inf STEMI 07/2013: s/p DES x 2 from ostial to [facility] 07/13/13. Residual Cx disease for med rx (PCI if recurrent pain).  . Aspirin allergy     a. Anaphylactic rxn.  [name] Dyslipidemia   . GERD (gastroesophageal reflux disease)   . Barrett's esophagus   . Hyperglycemia     a. A1C 5.8 in 07/2013.  [name] Myocardial infarction ([facility])   . Anxiety   . ED (erectile dysfunction)   . Hydrocele of testis    Past Surgical History  Procedure Laterality Date  . Left ankle surgery    . Coronary angioplasty      stents   . Cataract surgery       bilateral   . Hydrocele excision Left 07/17/2014    Procedure: LEFT HYDROCELECTOMY;  Surgeon: Malka So, MD;  Location: [facility] ORS;  Service: Urology;  Laterality: Left;  . Left heart catheterization with coronary angiogram N/A 07/13/2013    Procedure:  LEFT HEART CATHETERIZATION WITH CORONARY ANGIOGRAM;  Surgeon: Sinclair Grooms, MD;  Location: [facility] CATH LAB;  Service: Cardiovascular;  Laterality: N/A;  . Percutaneous coronary stent intervention (pci-s)  07/13/2013    Procedure: PERCUTANEOUS CORONARY STENT INTERVENTION (PCI-S);  Surgeon: Sinclair Grooms, MD;  Location: [facility] CATH LAB;  Service: Cardiovascular;;   Family History  Problem Relation Age of Onset  . Hypertension Mother    Social History  Substance Use Topics  . Smoking status: Never Smoker   . Smokeless tobacco: Never Used  . Alcohol Use: Yes     Comment: occasional beer     Review of Systems 10 Systems reviewed and are negative for acute change except as noted in the HPI.    Allergies  Aspirin and Quinine derivatives  Home Medications   Prior to Admission medications   Medication Sig Start Date End Date Taking? Authorizing Provider  acetaminophen (TYLENOL) 500 MG tablet Take 500 mg by mouth once as needed for headache.   Yes Historical Provider, MD  allopurinol (ZYLOPRIM) 300 MG tablet Take 300 mg by mouth every morning.  06/05/13  Yes Historical Provider, MD  ALPRAZolam (XANAX) 0.25 MG tablet Take 0.25 mg by mouth 3 (three) times daily as needed for anxiety.  06/29/13  Yes Historical Provider, MD  atorvastatin (LIPITOR) 80 MG tablet TAKE 1 TABLET BY MOUTH EVERY  EVENING Patient taking differently: TAKE 1/2 TABLET BY MOUTH EVERY EVENING 07/23/14  Yes Belva Crome, MD  clopidogrel (PLAVIX) 75 MG tablet TAKE 1 TABLET BY MOUTH EVERY DAY 07/07/15  Yes Belva Crome, MD  esomeprazole (NEXIUM) 40 MG capsule Take 40 mg by mouth daily at 12 noon.   Yes Historical Provider, MD  metoprolol tartrate (LOPRESSOR) 25 MG tablet TAKE 1 TABLET BY MOUTH TWICE A DAY 07/08/15  Yes Belva Crome, MD  Multiple Vitamins-Minerals (SENIOR MULTIVITAMIN PLUS PO) Take 1 tablet by mouth every morning.    Yes Historical Provider, MD  quinapril (ACCUPRIL) 40 MG tablet Take 40 mg by mouth every  evening.  06/27/13  Yes Historical Provider, MD  nitroGLYCERIN (NITROSTAT) 0.4 MG SL tablet Place 1 tablet (0.4 mg total) under the tongue every 5 (five) minutes as needed for chest pain (up to 3 doses). 07/16/13   Dayna N Dunn, PA-C   BP 148/69 mmHg  Pulse 60  Temp(Src) 98.1 F (36.7 C) (Oral)  Resp 17  SpO2 97% Physical Exam  Constitutional: He is oriented to person, place, and time. He appears well-developed and well-nourished. No distress.  Awake, alert  HENT:  Head: Normocephalic and atraumatic.  Eyes: Conjunctivae and EOM are normal. Pupils are equal, round, and reactive to light.  Neck: Neck supple.  Cardiovascular: Normal rate, regular rhythm and normal heart sounds.   No murmur heard. Pulmonary/Chest: Effort normal and breath sounds normal. No respiratory distress.  Abdominal: Soft. Bowel sounds are normal. He exhibits no distension.  Musculoskeletal: He exhibits no edema.  5/5 strength and normal sensation x all 4 ext  Neurological: He is alert and oriented to person, place, and time. He has normal reflexes. No cranial nerve deficit. He exhibits normal muscle tone.  Fluent speech, normal finger-to-nose testing, negative pronator drift  Skin: Skin is warm and dry.  Psychiatric: He has a normal mood and affect. Judgment and thought content normal.  Nursing note and vitals reviewed.   ED Course  Procedures (including critical care time) Labs Review Labs Reviewed  BASIC METABOLIC PANEL - Abnormal; Notable for the following:    Glucose, Bld 109 (*)    Calcium 8.8 (*)    All other components within normal limits  CBC WITH DIFFERENTIAL/PLATELET - Abnormal; Notable for the following:    RBC 3.84 (*)    Hemoglobin 12.9 (*)    HCT 38.7 (*)    MCV 100.8 (*)    All other components within normal limits  CBG MONITORING, ED - Abnormal; Notable for the following:    Glucose-Capillary 113 (*)    All other components within normal limits    Imaging Review Ct Head Wo  Contrast  09/14/2015  CLINICAL DATA:  Left-sided numbness and weakness, now resolved EXAM: CT HEAD WITHOUT CONTRAST TECHNIQUE: Contiguous axial images were obtained from the base of the skull through the vertex without intravenous contrast. COMPARISON:  07/07/2008 FINDINGS: Skull and Sinuses:Negative for fracture or destructive process. The visualized mastoids, middle ears, and imaged paranasal sinuses are clear. Orbits: No acute abnormality. Brain: No evidence of acute infarction, hemorrhage, hydrocephalus, or mass lesion/mass effect. Mild progression of generalized cerebral volume loss, expected given long interval and patient age. Dilated perivascular spaces noted along the lower right sylvian fissure. No unusual ischemic change for age. IMPRESSION: No acute finding.  Unremarkable head CT for age. Electronically Signed   By: Monte Fantasia M.D.   On: 09/14/2015 15:57   I have personally reviewed and  evaluated these lab results as part of my medical decision-making.   EKG Interpretation   Date/Time:  Monday September 14 2015 13:52:12 EST Ventricular Rate:  58 PR Interval:  235 QRS Duration: 97 QT Interval:  442 QTC Calculation: 434 R Axis:   -14 Text Interpretation:  Sinus or ectopic atrial rhythm Prolonged PR interval  Borderline T wave abnormalities No significant change since last tracing  Confirmed by Lashaunta Sicard MD, Tank Difiore [phone]) on 09/14/2015 1:56:48 PM      MDM   Final diagnoses:  Left sided numbness  Left-sided weakness   patient with history of CAD who presents with a sudden onset of left arm and leg weakness associated with numbness that lasted approximately 30 min and have almost completely resolved. On exam, the patient was awake, well appearing and in no acute distress. Vital signs notable for mild hypertension. No obvious neurologic deficits on exam. Obtained above labs as well as EKG and head CT. EKG unchanged from previous. Lab work unremarkable. Head CT was negative for acute  process. Patient's discretion of symptoms is concerning for TIA. I discussed with neurology, Dr. Leonel Ramsay, who has recommended TIA workup. Contacted medicine and spoke with Dr. Algis Liming regarding admission. Pt admitted to general medicine for further workup.  Sharlett Iles, MD 09/14/15 [phone]

## 2015-09-14 NOTE — H&P (Signed)
History and Physical  NAVON OBIE [id number] DOB: April 06, 1941 DOA: 09/14/2015  Referring physician: Dr. Theotis Burrow, EDP PCP: Nicoletta Dress, MD  Outpatient Specialists:  1. Cardiology: Dr. Daneen Schick  Chief Complaint: Left-sided numbness and weakness  HPI: Matthew Liu is a 74 y.o. 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 Patient was standing on his back take this morning and noticed 4 or 5 fire trucks passing by. He drove in his truck to find out what was happening. There was nothing significant & the fire trucks were getting ready to disperse. While he was driving back, at approximately 12:30 PM, he noticed sudden onset of numbness and weakness of his left upper extremity forearm distally. He reached home shortly thereafter and popped his truck. On getting down from the truck, he noticed numbness and heaviness/weakness of his left lower extremity. He was able to ambulate a few steps to his house but had difficulty climbing up 3 stairs to his house. He called out for his wife and indicated that he wanted to go to the hospital. At no point did he notice headache, slurred speech, facial asymmetry, chest pain, palpitations, dyspnea, dizziness, lightheadedness. His symptoms continued until he came to the ED at approximately 1:30 PM. Left upper extremity symptoms have completely resolved. His left lower extremity symptoms have improved but he still feels that the left lower extremity is not normal. In the ED, CT head negative for acute findings. Neurology was consulted by EDP. Hospitalist admission was requested.   Review  of Systems: All systems reviewed and apart from history of presenting illness, are negative.  Past Medical History  Diagnosis Date  . Hypertension   . CAD (coronary artery disease)     a. Inf STEMI 07/2013: s/p DES x 2 from ostial to [facility] 07/13/13. Residual Cx disease for med rx (PCI if recurrent pain).  . Aspirin allergy     a. Anaphylactic rxn.  [name] Dyslipidemia   . GERD (gastroesophageal reflux disease)   . Barrett's esophagus   . Hyperglycemia     a. A1C 5.8 in 07/2013.  [name] Myocardial infarction ([place])   . Anxiety   . ED (erectile dysfunction)   . Hydrocele of testis    Past Surgical History  Procedure Laterality Date  . Left ankle surgery    . Coronary angioplasty      stents   . Cataract surgery       bilateral   . Hydrocele excision Left 07/17/2014    Procedure: LEFT HYDROCELECTOMY;  Surgeon: Malka So, MD;  Location: [facility] ORS;  Service: Urology;  Laterality: Left;  . Left heart catheterization with coronary angiogram N/A 07/13/2013    Procedure: LEFT HEART CATHETERIZATION WITH CORONARY ANGIOGRAM;  Surgeon: Sinclair Grooms, MD;  Location: [facility] CATH LAB;  Service: Cardiovascular;  Laterality: N/A;  . Percutaneous coronary stent intervention (pci-s)  07/13/2013    Procedure: PERCUTANEOUS CORONARY STENT INTERVENTION (PCI-S);  Surgeon: Sinclair Grooms, MD;  Location: [facility] CATH LAB;  Service: Cardiovascular;;   Social History:  reports that he has never smoked. He has never used smokeless tobacco. He reports that he drinks alcohol. He reports that he does not use  illicit drugs. Occasionally drinks a beer.  Allergies  Allergen Reactions  . Aspirin Hives    "Shortness of Breath" can't breathe  . Quinine Derivatives Hives    "look like he has the measels"    Family History  Problem Relation Age of Onset  . Hypertension Mother     Prior to Admission medications   Medication Sig Start Date End Date Taking? Authorizing Provider  acetaminophen (TYLENOL) 500 MG tablet Take 500  mg by mouth once as needed for headache.   Yes Historical Provider, MD  allopurinol (ZYLOPRIM) 300 MG tablet Take 300 mg by mouth every morning.  06/05/13  Yes Historical Provider, MD  ALPRAZolam (XANAX) 0.25 MG tablet Take 0.25 mg by mouth 3 (three) times daily as needed for anxiety.  06/29/13  Yes Historical Provider, MD  atorvastatin (LIPITOR) 80 MG tablet TAKE 1 TABLET BY MOUTH EVERY EVENING Patient taking differently: TAKE 1/2 TABLET BY MOUTH EVERY EVENING 07/23/14  Yes Belva Crome, MD  clopidogrel (PLAVIX) 75 MG tablet TAKE 1 TABLET BY MOUTH EVERY DAY 07/07/15  Yes Belva Crome, MD  esomeprazole (NEXIUM) 40 MG capsule Take 40 mg by mouth daily at 12 noon.   Yes Historical Provider, MD  metoprolol tartrate (LOPRESSOR) 25 MG tablet TAKE 1 TABLET BY MOUTH TWICE A DAY 07/08/15  Yes Belva Crome, MD  Multiple Vitamins-Minerals (SENIOR MULTIVITAMIN PLUS PO) Take 1 tablet by mouth every morning.    Yes Historical Provider, MD  quinapril (ACCUPRIL) 40 MG tablet Take 40 mg by mouth every evening.  06/27/13  Yes Historical Provider, MD  nitroGLYCERIN (NITROSTAT) 0.4 MG SL tablet Place 1 tablet (0.4 mg total) under the tongue every 5 (five) minutes as needed for chest pain (up to 3 doses). 07/16/13   Charlie Pitter, PA-C   Physical Exam: Filed Vitals:   09/14/15 1459 09/14/15 1500 09/14/15 1530 09/14/15 1715  BP: 135/72 137/72 148/69 168/76  Pulse: 66 59 60 49  Temp:      TempSrc:      Resp: 18 17 17 16   SpO2: 100% 99% 97% 100%   temperature 98.30F   General exam: Moderately built and nourished pleasant elderly male patient, lying comfortably supine on the gurney in no obvious distress.  Head, eyes and ENT: Nontraumatic and normocephalic. Pupils equally reacting to light and accommodation. Oral mucosa moist.  Neck: Supple. No JVD, carotid bruit or thyromegaly.  Lymphatics: No lymphadenopathy.  Respiratory system: Clear to auscultation. No increased work of breathing.  Cardiovascular system:  S1 and S2 heard, RRR. No JVD, murmurs, gallops, clicks or pedal edema.  Gastrointestinal system: Abdomen is nondistended, soft and nontender. Normal bowel sounds heard. No organomegaly or masses appreciated.  Central nervous system: Alert and oriented. No focal neurological deficits.  Extremities: Symmetric 5 x 5 power. Peripheral pulses symmetrically felt.   Skin: No rashes or acute findings.  Musculoskeletal system: Negative exam.  Psychiatry: Pleasant and cooperative.   Labs on Admission:  Basic Metabolic Panel:  Recent Labs Lab 09/14/15 1423  NA 141  K 4.1  CL 108  CO2 26  GLUCOSE 109*  BUN 12  CREATININE 0.87  CALCIUM 8.8*   Liver Function Tests: No results for input(s): AST, ALT, ALKPHOS, BILITOT, PROT, ALBUMIN in the last 168 hours. No results for input(s): LIPASE, AMYLASE in the last 168 hours. No results for input(s): AMMONIA in the last 168 hours. CBC:  Recent Labs Lab 09/14/15 1423  WBC 4.6  NEUTROABS 2.3  HGB  12.9*  HCT 38.7*  MCV 100.8*  PLT 157   Cardiac Enzymes: No results for input(s): CKTOTAL, CKMB, CKMBINDEX, TROPONINI in the last 168 hours.  BNP (last 3 results) No results for input(s): PROBNP in the last 8760 hours. CBG:  Recent Labs Lab 09/14/15 1356  GLUCAP 113*    Radiological Exams on Admission: Ct Head Wo Contrast  09/14/2015  CLINICAL DATA:  Left-sided numbness and weakness, now resolved EXAM: CT HEAD WITHOUT CONTRAST TECHNIQUE: Contiguous axial images were obtained from the base of the skull through the vertex without intravenous contrast. COMPARISON:  07/07/2008 FINDINGS: Skull and Sinuses:Negative for fracture or destructive process. The visualized mastoids, middle ears, and imaged paranasal sinuses are clear. Orbits: No acute abnormality. Brain: No evidence of acute infarction, hemorrhage, hydrocephalus, or mass lesion/mass effect. Mild progression of generalized cerebral volume loss, expected given long interval and patient  age. Dilated perivascular spaces noted along the lower right sylvian fissure. No unusual ischemic change for age. IMPRESSION: No acute finding.  Unremarkable head CT for age. Electronically Signed   By: Monte Fantasia M.D.   On: 09/14/2015 15:57    EKG: Independently reviewed. Sinus rhythm with first-degree AV block, normal axis and no acute changes. QTC 434 ms.  Assessment/Plan Principal Problem:   TIA (transient ischemic attack) Active Problems:   HTN (hypertension)   GERD (gastroesophageal reflux disease)   CAD in native artery   Aspirin allergy   Dyslipidemia   TIA - Rule out CVA - Presented with left hemiparesis - resolving - CT head: No acute finding - Last known well: 09/14/15 at approximately 12:30 PM - Admit to telemetry and complete stroke workup: MRI/MRA brain, 2-D echo, carotid Dopplers, A1c and lipid panel. Patient apparently had atypical presentation during his prior MIs and hence we'll cycle troponin although he does not have chest pain or acute EKG changes. - PT, OT and ST evaluation. - On Plavix prior to admission-has taken this morning. Continue Plavix for secondary stroke prevention. - Neurology has been consulted. Discussed with neurology. Agree with the above plan.  Essential hypertension - Allow for permissive hypertension - Continue home dose of metoprolol and Accupril.  Dyslipidemia - Continue statins  GERD - Continue PPI  CAD status post stents - Asymptomatic of chest pain. No acute changes on EKG. Continue Plavix, beta blockers and statins.  Macrocytosis, mild anemia - Outpatient follow-up.    DVT prophylaxis: Lovenox Code Status: Full  Family Communication: Discussed with patient's spouse and granddaughter at bedside in detail. Updated care and answered questions.  Disposition Plan: DC home possibly 12/6   Time spent: 97 minutes  Shadie Sweatman, MD, FACP, FHM. [facility] Pager [phone]  If 7PM-7AM, please contact  night-coverage [email or web address] Password [facility] 09/14/2015, 5:37 PM

## 2015-09-14 NOTE — Consult Note (Addendum)
Stroke Consult Consulting Physician: Dr Algis Liming  Chief Complaint: left sided sensory and motor deficits  HPI: Matthew Liu is an 74 y.o. male hx of CAD, NSTEMI, HLD admitted with transient left sided numbness and weakness. Reports initial onset of LUE paresthesias, followed by a heavy sensation in his LLE. LUE symptoms have fully resolved, reports minimal heavy sensation in his LLE. No speech deficits, no visual deficits. No prior CVA or TIA. Takes Plavix 75mg  daily.   CT head imaging reviewed, no acute process noted.   Date last known well: 09/14/15 Time last known well: 26 tPA Given: no, symptoms resolved upon arrival to ED Modified Rankin: Rankin Score=0  Past Medical History  Diagnosis Date  . Hypertension   . CAD (coronary artery disease)     a. Inf STEMI 07/2013: s/p DES x 2 from ostial to [facility] 07/13/13. Residual Cx disease for med rx (PCI if recurrent pain).  . Aspirin allergy     a. Anaphylactic rxn.  [name] Dyslipidemia   . GERD (gastroesophageal reflux disease)   . Barrett's esophagus   . Hyperglycemia     a. A1C 5.8 in 07/2013.  [name] Myocardial infarction ([place])   . Anxiety   . ED (erectile dysfunction)   . Hydrocele of testis     Past Surgical History  Procedure Laterality Date  . Left ankle surgery    . Coronary angioplasty      stents   . Cataract surgery       bilateral   . Hydrocele excision Left 07/17/2014    Procedure: LEFT HYDROCELECTOMY;  Surgeon: Malka So, MD;  Location: [facility] ORS;  Service: Urology;  Laterality: Left;  . Left heart catheterization with coronary angiogram N/A 07/13/2013    Procedure: LEFT HEART CATHETERIZATION WITH CORONARY ANGIOGRAM;  Surgeon: Sinclair Grooms, MD;  Location: [facility] CATH LAB;  Service: Cardiovascular;  Laterality: N/A;  . Percutaneous coronary stent intervention (pci-s)  07/13/2013    Procedure: PERCUTANEOUS CORONARY STENT INTERVENTION (PCI-S);  Surgeon: Sinclair Grooms, MD;  Location: [facility] CATH LAB;  Service: Cardiovascular;;     Family History  Problem Relation Age of Onset  . Hypertension Mother    Social History:  reports that he has never smoked. He has never used smokeless tobacco. He reports that he drinks alcohol. He reports that he does not use illicit drugs.  Allergies:  Allergies  Allergen Reactions  . Aspirin Hives    "Shortness of Breath" can't breathe  . Quinine Derivatives Hives    "look like he has the measels"    Medications Prior to Admission  Medication Sig Dispense Refill  . acetaminophen (TYLENOL) 500 MG tablet Take 500 mg by mouth once as needed for headache.    . allopurinol (ZYLOPRIM) 300 MG tablet Take 300 mg by mouth every morning.     [name] ALPRAZolam (XANAX) 0.25 MG tablet Take 0.25 mg by mouth 3 (three) times daily as needed for anxiety.     [name] atorvastatin (LIPITOR) 80 MG tablet TAKE 1 TABLET BY MOUTH EVERY EVENING (Patient taking differently: TAKE 1/2 TABLET BY MOUTH EVERY EVENING) 30 tablet 5  . clopidogrel (PLAVIX) 75 MG tablet TAKE 1 TABLET BY MOUTH EVERY DAY 30 tablet 5  . esomeprazole (NEXIUM) 40 MG capsule Take 40 mg by mouth daily at 12 noon.    . metoprolol tartrate (LOPRESSOR) 25 MG tablet TAKE 1 TABLET BY MOUTH TWICE A DAY 60 tablet 3  . Multiple Vitamins-Minerals (SENIOR MULTIVITAMIN PLUS PO)  Take 1 tablet by mouth every morning.     . quinapril (ACCUPRIL) 40 MG tablet Take 40 mg by mouth every evening.     . nitroGLYCERIN (NITROSTAT) 0.4 MG SL tablet Place 1 tablet (0.4 mg total) under the tongue every 5 (five) minutes as needed for chest pain (up to 3 doses). 25 tablet 4    ROS: Out of a complete 14 system review, the patient complains of only the following symptoms, and all other reviewed systems are negative. +weakness, paresthesias  Physical Examination: Filed Vitals:   09/14/15 1800 09/14/15 1910  BP: 156/76 174/81  Pulse: 54 60  Temp:  98.1 F (36.7 C)  Resp: 17 20   Physical Exam  Constitutional: He appears well-developed and well-nourished.   Psych: Affect appropriate to situation Eyes: No scleral injection HENT: No OP obstrucion Head: Normocephalic.  Cardiovascular: Normal rate and regular rhythm.  Respiratory: Effort normal and breath sounds normal.  GI: Soft. Bowel sounds are normal. No distension. There is no tenderness.  Skin: WDI  Neurologic Examination: Mental Status: Alert, oriented, thought content appropriate.  Speech fluent without evidence of aphasia. No dysarthria.  Able to follow 3 step commands without difficulty. Cranial Nerves: II: funduscopic exam wnl bilaterally, visual fields grossly normal, pupils equal, round, reactive to light and accommodation III,IV, VI: ptosis not present, extra-ocular motions intact bilaterally V,VII: smile symmetric, facial light touch sensation normal bilaterally VIII: hearing normal bilaterally IX,X: gag reflex present XI: trapezius strength/neck flexion strength normal bilaterally XII: tongue strength normal  Motor: Right : Upper extremity    Left:     Upper extremity 5/5 deltoid       5/5 deltoid 5/5 biceps      5/5 biceps  5/5 triceps      5/5 triceps 5/5 hand grip      5/5 hand grip  Lower extremity     Lower extremity 5/5 hip flexor      5/5 hip flexor 5/5 quadricep      5/5 quadriceps  5/5 hamstrings     5/5 hamstrings 5/5 plantar flexion       5/5 plantar flexion 5/5 plantar extension     5/5 plantar extension Tone and bulk:normal tone throughout; no atrophy noted Sensory: Pinprick and light touch intact throughout, bilaterally Deep Tendon Reflexes: 2+ and symmetric throughout Plantars: Right: downgoing   Left: downgoing Cerebellar: normal finger-to-nose, and normal heel-to-shin test Gait: deferred  Laboratory Studies:   Basic Metabolic Panel:  Recent Labs Lab 09/14/15 1423  NA 141  K 4.1  CL 108  CO2 26  GLUCOSE 109*  BUN 12  CREATININE 0.87  CALCIUM 8.8*    Liver Function Tests: No results for input(s): AST, ALT, ALKPHOS, BILITOT,  PROT, ALBUMIN in the last 168 hours. No results for input(s): LIPASE, AMYLASE in the last 168 hours. No results for input(s): AMMONIA in the last 168 hours.  CBC:  Recent Labs Lab 09/14/15 1423  WBC 4.6  NEUTROABS 2.3  HGB 12.9*  HCT 38.7*  MCV 100.8*  PLT 157    Cardiac Enzymes: No results for input(s): CKTOTAL, CKMB, CKMBINDEX, TROPONINI in the last 168 hours.  BNP: Invalid input(s): POCBNP  CBG:  Recent Labs Lab 09/14/15 1356  GLUCAP 113*    Microbiology: Results for orders placed or performed during the hospital encounter of 07/13/13  MRSA PCR Screening     Status: None   Collection Time: 07/13/13  7:45 PM  Result Value Ref Range Status   MRSA  by PCR NEGATIVE NEGATIVE Final    Comment:        The GeneXpert MRSA Assay (FDA approved for NASAL specimens only), is one component of a comprehensive MRSA colonization surveillance program. It is not intended to diagnose MRSA infection nor to guide or monitor treatment for MRSA infections.    Coagulation Studies: No results for input(s): LABPROT, INR in the last 72 hours.  Urinalysis: No results for input(s): COLORURINE, LABSPEC, PHURINE, GLUCOSEU, HGBUR, BILIRUBINUR, KETONESUR, PROTEINUR, UROBILINOGEN, NITRITE, LEUKOCYTESUR in the last 168 hours.  Invalid input(s): APPERANCEUR  Lipid Panel:     Component Value Date/Time   CHOL 104 08/27/2013 1037   TRIG 73.0 08/27/2013 1037   HDL 44.30 08/27/2013 1037   CHOLHDL 2 08/27/2013 1037   VLDL 14.6 08/27/2013 1037   LDLCALC 45 08/27/2013 1037    HgbA1C:  Lab Results  Component Value Date   HGBA1C 5.8* 07/13/2013    Urine Drug Screen:  No results found for: LABOPIA, COCAINSCRNUR, LABBENZ, AMPHETMU, THCU, LABBARB  Alcohol Level: No results for input(s): ETH in the last 168 hours.   Imaging: Ct Head Wo Contrast  09/14/2015  CLINICAL DATA:  Left-sided numbness and weakness, now resolved EXAM: CT HEAD WITHOUT CONTRAST TECHNIQUE: Contiguous axial images  were obtained from the base of the skull through the vertex without intravenous contrast. COMPARISON:  07/07/2008 FINDINGS: Skull and Sinuses:Negative for fracture or destructive process. The visualized mastoids, middle ears, and imaged paranasal sinuses are clear. Orbits: No acute abnormality. Brain: No evidence of acute infarction, hemorrhage, hydrocephalus, or mass lesion/mass effect. Mild progression of generalized cerebral volume loss, expected given long interval and patient age. Dilated perivascular spaces noted along the lower right sylvian fissure. No unusual ischemic change for age. IMPRESSION: No acute finding.  Unremarkable head CT for age. Electronically Signed   By: Monte Fantasia M.D.   On: 09/14/2015 15:57    Assessment: 74 y.o. male hx of CAD, NSTEMI, HLD admitted with transient episode of left sided weakness and sensory deficits. Neuro exam currently non-focal though patient reports sensation of heaviness in LLE. Admitted for TIA workup.    Plan: 1. HgbA1c, fasting lipid panel 2. MRI, MRA  of the brain without contrast 3. PT consult, OT consult, Speech consult 4. Echocardiogram 5. Carotid dopplers 6. Prophylactic therapy-continue Plavix 75mg  daily 7. Risk factor modification 8. Telemetry monitoring 9. Frequent neuro checks 10. NPO until RN stroke swallow screen    Jim Like, DO [facility] [phone]  If 7pm- 7am, please page neurology on call as listed in [place]. 09/14/2015, 7:56 PM

## 2015-09-15 ENCOUNTER — Encounter (HOSPITAL_COMMUNITY): Payer: Self-pay | Admitting: Nurse Practitioner

## 2015-09-15 ENCOUNTER — Observation Stay (HOSPITAL_COMMUNITY): Payer: Medicare Other

## 2015-09-15 DIAGNOSIS — I639 Cerebral infarction, unspecified: Secondary | ICD-10-CM | POA: Diagnosis not present

## 2015-09-15 DIAGNOSIS — I6789 Other cerebrovascular disease: Secondary | ICD-10-CM | POA: Diagnosis not present

## 2015-09-15 DIAGNOSIS — Z8673 Personal history of transient ischemic attack (TIA), and cerebral infarction without residual deficits: Secondary | ICD-10-CM | POA: Diagnosis present

## 2015-09-15 DIAGNOSIS — Z955 Presence of coronary angioplasty implant and graft: Secondary | ICD-10-CM | POA: Diagnosis not present

## 2015-09-15 DIAGNOSIS — Z9841 Cataract extraction status, right eye: Secondary | ICD-10-CM | POA: Diagnosis not present

## 2015-09-15 DIAGNOSIS — F419 Anxiety disorder, unspecified: Secondary | ICD-10-CM | POA: Diagnosis present

## 2015-09-15 DIAGNOSIS — R29701 NIHSS score 1: Secondary | ICD-10-CM | POA: Diagnosis present

## 2015-09-15 DIAGNOSIS — G4733 Obstructive sleep apnea (adult) (pediatric): Secondary | ICD-10-CM | POA: Diagnosis present

## 2015-09-15 DIAGNOSIS — Z7902 Long term (current) use of antithrombotics/antiplatelets: Secondary | ICD-10-CM | POA: Diagnosis not present

## 2015-09-15 DIAGNOSIS — S82892A Other fracture of left lower leg, initial encounter for closed fracture: Secondary | ICD-10-CM | POA: Diagnosis present

## 2015-09-15 DIAGNOSIS — Z9842 Cataract extraction status, left eye: Secondary | ICD-10-CM | POA: Diagnosis not present

## 2015-09-15 DIAGNOSIS — M6289 Other specified disorders of muscle: Secondary | ICD-10-CM | POA: Diagnosis present

## 2015-09-15 DIAGNOSIS — Z7289 Other problems related to lifestyle: Secondary | ICD-10-CM | POA: Diagnosis not present

## 2015-09-15 DIAGNOSIS — K2270 Barrett's esophagus without dysplasia: Secondary | ICD-10-CM | POA: Diagnosis present

## 2015-09-15 DIAGNOSIS — I2510 Atherosclerotic heart disease of native coronary artery without angina pectoris: Secondary | ICD-10-CM

## 2015-09-15 DIAGNOSIS — Z888 Allergy status to other drugs, medicaments and biological substances status: Secondary | ICD-10-CM | POA: Diagnosis not present

## 2015-09-15 DIAGNOSIS — R739 Hyperglycemia, unspecified: Secondary | ICD-10-CM | POA: Diagnosis present

## 2015-09-15 DIAGNOSIS — Z886 Allergy status to analgesic agent status: Secondary | ICD-10-CM | POA: Diagnosis not present

## 2015-09-15 DIAGNOSIS — D539 Nutritional anemia, unspecified: Secondary | ICD-10-CM | POA: Diagnosis present

## 2015-09-15 DIAGNOSIS — R270 Ataxia, unspecified: Secondary | ICD-10-CM | POA: Diagnosis present

## 2015-09-15 DIAGNOSIS — Z87892 Personal history of anaphylaxis: Secondary | ICD-10-CM | POA: Diagnosis not present

## 2015-09-15 DIAGNOSIS — Z8249 Family history of ischemic heart disease and other diseases of the circulatory system: Secondary | ICD-10-CM | POA: Diagnosis not present

## 2015-09-15 DIAGNOSIS — E785 Hyperlipidemia, unspecified: Secondary | ICD-10-CM | POA: Diagnosis present

## 2015-09-15 DIAGNOSIS — Z889 Allergy status to unspecified drugs, medicaments and biological substances status: Secondary | ICD-10-CM | POA: Diagnosis not present

## 2015-09-15 DIAGNOSIS — G8194 Hemiplegia, unspecified affecting left nondominant side: Secondary | ICD-10-CM | POA: Diagnosis present

## 2015-09-15 DIAGNOSIS — I10 Essential (primary) hypertension: Secondary | ICD-10-CM | POA: Diagnosis present

## 2015-09-15 DIAGNOSIS — I638 Other cerebral infarction: Secondary | ICD-10-CM | POA: Diagnosis present

## 2015-09-15 DIAGNOSIS — K219 Gastro-esophageal reflux disease without esophagitis: Secondary | ICD-10-CM | POA: Diagnosis present

## 2015-09-15 DIAGNOSIS — I252 Old myocardial infarction: Secondary | ICD-10-CM | POA: Diagnosis not present

## 2015-09-15 HISTORY — DX: Cerebral infarction, unspecified: I63.9

## 2015-09-15 LAB — TROPONIN I

## 2015-09-15 LAB — LIPID PANEL
Cholesterol: 112 mg/dL (ref 0–200)
HDL: 42 mg/dL (ref >40)
LDL CALC: 48 mg/dL (ref 0–99)
Total CHOL/HDL Ratio: 2.7 RATIO
Triglycerides: 109 mg/dL (ref <150)
VLDL: 22 mg/dL (ref 0–40)

## 2015-09-15 LAB — GLUCOSE, CAPILLARY
GLUCOSE-CAPILLARY: 102 mg/dL — AB (ref 65–99)
GLUCOSE-CAPILLARY: 109 mg/dL — AB (ref 65–99)
Glucose-Capillary: 104 mg/dL — ABNORMAL HIGH (ref 65–99)
Glucose-Capillary: 125 mg/dL — ABNORMAL HIGH (ref 65–99)

## 2015-09-15 NOTE — Evaluation (Signed)
Speech Language Pathology Evaluation Patient Details Name: KORTEZ HAAGEN MRN: [id number] DOB: 08-29-1941 Today's Date: 09/15/2015 Time: [id number] SLP Time Calculation (min) (ACUTE ONLY): 16 min  Problem List:  Patient Active Problem List   Diagnosis Date Noted  . Ankle fracture, left   . TIA (transient ischemic attack) 09/14/2015  . Left sided numbness   . Left-sided weakness   . Erectile dysfunction 12/01/2014  . CAD in native artery 07/16/2013  . Aspirin allergy 07/16/2013  . Dyslipidemia 07/16/2013  . Hyperglycemia 07/16/2013  . 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 HTN (hypertension) 07/13/2013  . GERD (gastroesophageal reflux disease) 07/13/2013  . Barrett's esophagus 07/13/2013   Past Medical History:  Past Medical History  Diagnosis Date  . Hypertension   . CAD (coronary artery disease)     a. Inf STEMI 07/2013: s/p DES x 2 from ostial to [facility] 07/13/13. Residual Cx disease for med rx (PCI if recurrent pain).  . Aspirin allergy     a. Anaphylactic rxn.  [name] Dyslipidemia   . GERD (gastroesophageal reflux disease)   . Barrett's esophagus   . Hyperglycemia     a. A1C 5.8 in 07/2013.  [name] Myocardial infarction ([place])   . Anxiety   . ED (erectile dysfunction)   . Hydrocele of testis   . Ankle fracture, left 2009    rod placed   Past Surgical History:  Past Surgical History  Procedure Laterality Date  . Left ankle surgery    . Coronary angioplasty      stents   . Cataract surgery       bilateral   . Hydrocele excision Left 07/17/2014    Procedure: LEFT HYDROCELECTOMY;  Surgeon: Malka So, MD;  Location: [facility];  Service: Urology;  Laterality: Left;  . Left heart catheterization with coronary angiogram N/A 07/13/2013    Procedure: LEFT HEART CATHETERIZATION WITH CORONARY ANGIOGRAM;  Surgeon: Sinclair Grooms, MD;  Location: [facility] CATH LAB;  Service: Cardiovascular;  Laterality: N/A;  . Percutaneous coronary stent intervention (pci-s)   07/13/2013    Procedure: PERCUTANEOUS CORONARY STENT INTERVENTION (PCI-S);  Surgeon: Sinclair Grooms, MD;  Location: [facility] CATH LAB;  Service: Cardiovascular;;   HPI:  MARKQUIS BRUCK is a 74 y.o. 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 Patient positive for right corona radiata infarct.   Assessment / Plan / Recommendation Clinical Impression  Cognitive-linguistic evaluation complete. Cognitive-linguistic function WFL and at baseline. No further SLP needs indicated at this time.     SLP Assessment  Patient does not need any further Speech Lanaguage Pathology Services    Follow Up Recommendations  None    Frequency and Duration           SLP Evaluation Prior Functioning  Cognitive/Linguistic Baseline: Within functional limits Type of Home: House Available Help at Discharge: Family   Cognition  Overall Cognitive Status: Within Functional Limits for tasks assessed Orientation Level: Oriented X4    Comprehension  Auditory Comprehension Overall Auditory Comprehension: Appears within functional limits for tasks assessed Visual Recognition/Discrimination Discrimination: Within Function Limits Reading Comprehension Reading Status: Not tested    Expression Expression Primary Mode of Expression:  Verbal Verbal Expression Overall Verbal Expression: Appears within functional limits for tasks assessed Written Expression Dominant Hand: Right   Oral / Motor Oral Motor/Sensory Function Overall Oral Motor/Sensory Function: Within functional limits Motor Speech Overall Motor Speech: Appears within functional limits for  tasks assessed   Gabriel Rainwater MA, CCC-SLP [phone]  Ysidra Sopher Meryl 09/15/2015, 1:38 PM

## 2015-09-15 NOTE — Progress Notes (Signed)
PROGRESS NOTE    Matthew Liu [id number] DOB: September 15, 1941 DOA: 09/14/2015 PCP: Nicoletta Dress, MD  HPI/Brief narrative 74 y.o. 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 CT head negative for stroke but MRI brain confirms R brain stroke. Admitted for stroke workup. Neurology consult pain.   Assessment/Plan:  Stroke: Right Corona Radiata infarct  - Resultant left hemiparesis/hemisensory deficits-improved. - CT head: Negative for stroke - MRI brain: Confirmed 7 mm right Right Corona Radiata infarct  - MRA brain: No large vessel stenosis - Carotid Doppler: Pending - 2-D echo: Pending - LDL 48 - A1c pending - Patient was on Plavix 75 MG daily prior to admission-continue same for secondary stroke prevention. Allergic to aspirin. - No TPA on admission, rapid resolution of symptoms - PT, OT and ST evaluation: Home health PT, rolling walker with 5 inch wheels - Neurology/stroke service consultation and follow-up appreciated  Essential hypertension - Mildly uncontrolled. Allow for permissive hypertension. Continue home dose of metoprolol and Accupril  Hyperlipidemia - LDL 48, goal <70. Continue home dose of Lipitor  GERD - Continue PPI  CAD status post stents - Asymptomatic of chest pain. No acute changes on EKG. Continue Plavix, beta blockers and statins.  Macrocytosis, mild anemia - Outpatient follow-up.   DVT prophylaxis: Lovenox Code Status: Full Family Communication: Discussed with spouse at bedside Disposition Plan: DC home possibly 12/7   Consultants:  Neurology/stroke  service  Procedures:  None  Antibiotics:  None   Subjective: Feels better. Resolution of symptoms in left upper extremity. Left lower extremity does not feel back to baseline.  Objective: Filed Vitals:   09/15/15 0400 09/15/15 0600 09/15/15 0800 09/15/15 1324  BP: 135/66 172/82 161/76 165/75  Pulse: 57 58 62 58  Temp: 98.4 F (36.9 C) 98.7 F (37.1 C) 98.3 F (36.8 C) 97.7 F (36.5 C)  TempSrc: Oral Oral Oral Oral  Resp: 20 20 18 20   Height:      Weight:      SpO2: 99% 99% 98% 100%    Intake/Output Summary (Last 24 hours) at 09/15/15 1644 Last data filed at 09/15/15 1230  Gross per 24 hour  Intake   1360 ml  Output    550 ml  Net    810 ml   Filed Weights   09/14/15 2000  Weight: 76.8 kg (169 lb 5 oz)     Exam:  General exam: Pleasant elderly male lying comfortably in bed. Respiratory system: Clear. No increased work of breathing. Cardiovascular system: S1 & S2 heard, RRR. No JVD, murmurs, gallops, clicks or pedal edema. Telemetry: SB in the 50s-SR and 60s Gastrointestinal system: Abdomen is nondistended, soft and nontender. Normal bowel sounds heard. Central nervous system: Alert and oriented. No focal neurological deficits. Extremities: Symmetric 5 x 5 power in all limbs except left lower extremity where grade 4+ by 5 power.   Data Reviewed: Basic Metabolic Panel:  Recent Labs Lab 09/14/15 1423  NA 141  K 4.1  CL 108  CO2 26  GLUCOSE 109*  BUN 12  CREATININE 0.87  CALCIUM 8.8*   Liver Function Tests: No results for input(s): AST, ALT, ALKPHOS, BILITOT, PROT, ALBUMIN in the last 168 hours. No results for  input(s): LIPASE, AMYLASE in the last 168 hours. No results for input(s): AMMONIA in the last 168 hours. CBC:  Recent Labs Lab 09/14/15 1423  WBC 4.6  NEUTROABS 2.3  HGB 12.9*  HCT 38.7*  MCV 100.8*  PLT 157   Cardiac Enzymes:  Recent Labs Lab 09/14/15 2118 09/15/15 0205 09/15/15 0852  TROPONINI <0.03 <0.03 <0.03   BNP  (last 3 results) No results for input(s): PROBNP in the last 8760 hours. CBG:  Recent Labs Lab 09/14/15 1356 09/14/15 2216 09/15/15 0717 09/15/15 1116 09/15/15 1619  GLUCAP 113* 145* 102* 109* 104*    No results found for this or any previous visit (from the past 240 hour(s)).         Studies: Dg Chest 2 View  09/14/2015  CLINICAL DATA:  Stroke EXAM: CHEST  2 VIEW COMPARISON:  07/09/2014 FINDINGS: Lungs are clear.  No pleural effusion or pneumothorax. The heart is top-normal in size. Degenerative changes of the visualized thoracolumbar spine. IMPRESSION: No evidence of acute cardiopulmonary disease. Electronically Signed   By: Julian Hy M.D.   On: 09/14/2015 21:03   Ct Head Wo Contrast  09/14/2015  CLINICAL DATA:  Left-sided numbness and weakness, now resolved EXAM: CT HEAD WITHOUT CONTRAST TECHNIQUE: Contiguous axial images were obtained from the base of the skull through the vertex without intravenous contrast. COMPARISON:  07/07/2008 FINDINGS: Skull and Sinuses:Negative for fracture or destructive process. The visualized mastoids, middle ears, and imaged paranasal sinuses are clear. Orbits: No acute abnormality. Brain: No evidence of acute infarction, hemorrhage, hydrocephalus, or mass lesion/mass effect. Mild progression of generalized cerebral volume loss, expected given long interval and patient age. Dilated perivascular spaces noted along the lower right sylvian fissure. No unusual ischemic change for age. IMPRESSION: No acute finding.  Unremarkable head CT for age. Electronically Signed   By: Monte Fantasia M.D.   On: 09/14/2015 15:57   Mr Brain Wo Contrast  09/15/2015  CLINICAL DATA:  Initial evaluation for acute left-sided numbness and weakness. EXAM: MRI HEAD WITHOUT CONTRAST MRA HEAD WITHOUT CONTRAST TECHNIQUE: Multiplanar, multiecho pulse sequences of the brain and surrounding structures were obtained without intravenous contrast. Angiographic images of the head  were obtained using MRA technique without contrast. COMPARISON:  Prior CT from 09/14/2015. FINDINGS: MRI HEAD FINDINGS There is a small focus of restricted diffusion involving the periventricular white matter of the right Corona radiata with slight inferior extension (Series 3, image 30). This measures up to 7 mm. Associated signal loss on ADC map. Minimal T2/FLAIR hyperintensity present. Findings consistent with a small acute ischemic infarct. No associated hemorrhage or mass effect. No other infarct. No acute or chronic intracranial hemorrhage. Normal intravascular flow voids are maintained. Mild diffuse prominence of the CSF containing spaces is compatible with generalized cerebral atrophy. Minimal confluent T2/FLAIR hyperintensity within the periventricular white matter most likely related to chronic small vessel ischemic disease. Minimal small vessels changes present within the pons as well. No mass lesion, midline shift, or mass effect. No hydrocephalus. No extra-axial fluid collection. Craniocervical junction within normal limits. Degenerative spondylolysis noted within the visualized upper cervical spine without significant stenosis. Pituitary gland within normal limits. No acute abnormality about the orbits. Sequela prior bilateral lens extraction noted. Scattered mucosal thickening within the ethmoidal air cells, maxillary sinuses, and frontal sinuses. No air-fluid levels to suggest active sinus infection. No significant mastoid effusion. Inner ear structures grossly normal. MRA HEAD FINDINGS ANTERIOR CIRCULATION: Distal cervical segments of the internal carotid arteries are patent with antegrade  flow. Petrous segments are widely patent. Cavernous and supraclinoid segments well opacified bilaterally. A1 segments patent. Anterior communicating artery normal. Anterior cerebral arteries opacified to their distal aspects. M1 segments patent without stenosis or occlusion. MCA bifurcations normal. Distal MCA  branches well opacified and symmetric. Mild distal branch atheromatous irregularity within the distal left MCA branches. POSTERIOR CIRCULATION: Vertebral arteries patent to the vertebrobasilar junction. Right vertebral artery slightly dominant. Basilar artery well opacified. Dominant right anterior inferior cerebral artery noted. Superior cerebral arteries opacified bilaterally. Both posterior cerebral arteries arise from the basilar artery and are well opacified to their distal aspects. No aneurysm or vascular malformation. IMPRESSION: MRI HEAD IMPRESSION: 1. 7 mm acute ischemic infarct within the right corona radiata. No associated hemorrhage or mass effect. 2. Age-related cerebral atrophy with mild chronic small vessel ischemic disease. MRA HEAD IMPRESSION: Negative intracranial MRA without large or proximal arterial branch occlusion. No high-grade or correctable stenosis. Electronically Signed   By: Jeannine Boga M.D.   On: 09/15/2015 04:56   Mr Jodene Nam Head/brain Wo Cm  09/15/2015  CLINICAL DATA:  Initial evaluation for acute left-sided numbness and weakness. EXAM: MRI HEAD WITHOUT CONTRAST MRA HEAD WITHOUT CONTRAST TECHNIQUE: Multiplanar, multiecho pulse sequences of the brain and surrounding structures were obtained without intravenous contrast. Angiographic images of the head were obtained using MRA technique without contrast. COMPARISON:  Prior CT from 09/14/2015. FINDINGS: MRI HEAD FINDINGS There is a small focus of restricted diffusion involving the periventricular white matter of the right Corona radiata with slight inferior extension (Series 3, image 30). This measures up to 7 mm. Associated signal loss on ADC map. Minimal T2/FLAIR hyperintensity present. Findings consistent with a small acute ischemic infarct. No associated hemorrhage or mass effect. No other infarct. No acute or chronic intracranial hemorrhage. Normal intravascular flow voids are maintained. Mild diffuse prominence of the CSF  containing spaces is compatible with generalized cerebral atrophy. Minimal confluent T2/FLAIR hyperintensity within the periventricular white matter most likely related to chronic small vessel ischemic disease. Minimal small vessels changes present within the pons as well. No mass lesion, midline shift, or mass effect. No hydrocephalus. No extra-axial fluid collection. Craniocervical junction within normal limits. Degenerative spondylolysis noted within the visualized upper cervical spine without significant stenosis. Pituitary gland within normal limits. No acute abnormality about the orbits. Sequela prior bilateral lens extraction noted. Scattered mucosal thickening within the ethmoidal air cells, maxillary sinuses, and frontal sinuses. No air-fluid levels to suggest active sinus infection. No significant mastoid effusion. Inner ear structures grossly normal. MRA HEAD FINDINGS ANTERIOR CIRCULATION: Distal cervical segments of the internal carotid arteries are patent with antegrade flow. Petrous segments are widely patent. Cavernous and supraclinoid segments well opacified bilaterally. A1 segments patent. Anterior communicating artery normal. Anterior cerebral arteries opacified to their distal aspects. M1 segments patent without stenosis or occlusion. MCA bifurcations normal. Distal MCA branches well opacified and symmetric. Mild distal branch atheromatous irregularity within the distal left MCA branches. POSTERIOR CIRCULATION: Vertebral arteries patent to the vertebrobasilar junction. Right vertebral artery slightly dominant. Basilar artery well opacified. Dominant right anterior inferior cerebral artery noted. Superior cerebral arteries opacified bilaterally. Both posterior cerebral arteries arise from the basilar artery and are well opacified to their distal aspects. No aneurysm or vascular malformation. IMPRESSION: MRI HEAD IMPRESSION: 1. 7 mm acute ischemic infarct within the right corona radiata. No  associated hemorrhage or mass effect. 2. Age-related cerebral atrophy with mild chronic small vessel ischemic disease. MRA HEAD IMPRESSION: Negative intracranial MRA without large or  proximal arterial branch occlusion. No high-grade or correctable stenosis. Electronically Signed   By: Jeannine Boga M.D.   On: 09/15/2015 04:56        Scheduled Meds: . allopurinol  300 mg Oral Daily  . atorvastatin  40 mg Oral QPM  . clopidogrel  75 mg Oral Daily  . enoxaparin (LOVENOX) injection  40 mg Subcutaneous Q24H  . metoprolol tartrate  25 mg Oral BID  . pantoprazole  40 mg Oral Daily  . quinapril  40 mg Oral QPM   Continuous Infusions:   Principal Problem:   TIA (transient ischemic attack) Active Problems:   HTN (hypertension)   GERD (gastroesophageal reflux disease)   CAD in native artery   Aspirin allergy   Dyslipidemia   Ankle fracture, left   Acute ischemic stroke ([place])    Time spent: 30 minutes.    Vernell Leep, MD, FACP, FHM. [facility] Pager [phone]  If 7PM-7AM, please contact night-coverage [email or web address] Password [email or web address] 09/15/2015, 4:44 PM    LOS: 0 days

## 2015-09-15 NOTE — Progress Notes (Signed)
OT Cancellation Note  Patient Details Name: BINYOMIN WOODWORTH MRN: [id number] DOB: 12-Jan-1941   Cancelled Treatment:    Reason Eval/Treat Not Completed: Patient at procedure or test/ unavailable. Will check back as able for OT eval/treat.  Hasaan Radde , MS, OTR/L, CLT Pager: [phone]  09/15/2015, 3:06 PM

## 2015-09-15 NOTE — Evaluation (Addendum)
Physical Therapy Evaluation Patient Details Name: Matthew Liu MRN: [id number] DOB: 03-28-1941 Today's Date: 09/15/2015   History of Present Illness  Matthew Liu is a 74 y.o. 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  Patient positive for right corona radiata infarct.  Clinical Impression  Patient presents with decreased independence and safety with mobility due to deficits listed in PT problem list.  He presents with balance deficit with high fall risk per Merrilee Jansky (scored 32/56, scores < 45 demonstrate high fall risk.) He will benefit from skilled PT in the acute setting to allow return home with HHPT follow up and wife assist.  Educated need to have nonskid footwear, use walker, and take his time when getting up.  Wife able to supervise at home.  Already has walker, can borrow 3:1 from a neighbor and has shower stool.      Follow Up Recommendations Home health PT;Supervision for mobility/OOB    Equipment Recommendations  Rolling walker with 5" wheels    Recommendations for Other Services       Precautions / Restrictions Precautions Precautions: Fall      Mobility  Bed Mobility               General bed mobility comments: sitting EOB   Transfers Overall transfer level: Needs assistance Equipment used: None Transfers: Sit to/from Stand Sit to Stand: Supervision;Min guard         General transfer comment: tendency to brace legs against edge of bed, sits with uncontrolled descent  Ambulation/Gait Ambulation/Gait assistance: Min assist;Min guard;Supervision Ambulation Distance (Feet): 250 Feet Assistive  device: None;Rolling walker (2 wheeled) Gait Pattern/deviations: Decreased stride length;Decreased dorsiflexion - left;Wide base of support;Step-through pattern     General Gait Details: shorter step on L compared to R, decreased lateral weight shift and trunk rotation with cautious slow speed, (did don shoes after noting hesitancy walking with improved confidence).  Walked to therapy room with minguard no device, then back with RW and improved confidence, step length, etc  Stairs Stairs: Yes   Stair Management: One rail Right;Step to pattern;Forwards Number of Stairs: 4 General stair comments: cues for sequence due to L coordination deficits  Wheelchair Mobility    Modified Rankin (Stroke Patients Only) Modified Rankin (Stroke Patients Only) Pre-Morbid Rankin Score: No symptoms Modified Rankin: Moderately severe disability     Balance Overall balance assessment: Needs assistance           Standing balance-Leahy Scale: Poor Standing balance comment: over time leans posterior needing assist to correct                 Standardized Balance Assessment Standardized Balance Assessment : Berg Balance Test Berg Balance Test Sit to Stand: Able to stand using hands after several tries Standing Unsupported: Able to stand 30 seconds unsupported Sitting with Back Unsupported but Feet Supported on Floor or Stool: Able to sit safely and securely 2 minutes Stand to Sit: Sits independently, has uncontrolled descent Transfers: Able to transfer safely, definite need of hands Standing Unsupported with Eyes Closed: Able to stand 10 seconds with supervision Standing Ubsupported with Feet Together: Needs help to attain position but able to stand for 30 seconds with feet together  From Standing, Reach Forward with Outstretched Arm: Can reach confidently >25 cm (10") From Standing Position, Pick up Object from Floor: Able to pick up shoe, needs supervision From Standing Position, Turn to  Look Behind Over each Shoulder: Looks behind one side only/other side shows less weight shift Turn 360 Degrees: Able to turn 360 degrees safely but slowly Standing Unsupported, Alternately Place Feet on Step/Stool: Able to complete >2 steps/needs minimal assist Standing Unsupported, One Foot in Front: Able to take small step independently and hold 30 seconds Standing on One Leg: Tries to lift leg/unable to hold 3 seconds but remains standing independently Total Score: 32         Pertinent Vitals/Pain Pain Assessment: No/denies pain    Home Living Family/patient expects to be discharged to:: Private residence Living Arrangements: Spouse/significant other Available Help at Discharge: Family Type of Home: House Home Access: Stairs to enter   [employer] of Steps: 2 at front no rail, 12 in back with rails Home Layout: One level Home Equipment: [place] - 2 wheels;Shower seat Additional Comments: can borrow 3:1 from neighbor potentially    Prior Function Level of Independence: Independent               Hand Dominance   Dominant Hand: Right    Extremity/Trunk Assessment   Upper Extremity Assessment: LUE deficits/detail       LUE Deficits / Details: decreased coordination with finger to nose compared to R   Lower Extremity Assessment: RLE deficits/detail;LLE deficits/detail RLE Deficits / Details: AROM WFL, strength hip flexion 4-/5, otherwise WFL,  LLE Deficits / Details: AROM WFL, strength hip flexion 4/5, knee extension 4/5, ankle DF 4-/5, decreased coordination with heel to shin compared to R     Communication   Communication: No difficulties  Cognition Arousal/Alertness: Awake/alert Behavior During Therapy: WFL for tasks assessed/performed Overall Cognitive Status: Within Functional Limits for tasks assessed                      General Comments      Exercises        Assessment/Plan    PT Assessment Patient needs continued PT  services  PT Diagnosis Abnormality of gait   PT Problem List Decreased knowledge of use of DME;Decreased safety awareness;Decreased balance;Decreased mobility;Decreased coordination  PT Treatment Interventions DME instruction;Balance training;Gait training;Stair training;Functional mobility training;Patient/family education;Therapeutic activities;Therapeutic exercise   PT Goals (Current goals can be found in the Care Plan section) Acute Rehab PT Goals Patient Stated Goal: to go home PT Goal Formulation: With patient/family Time For Goal Achievement: 09/29/15 Potential to Achieve Goals: Good    Frequency Min 4X/week   Barriers to discharge        Co-evaluation               End of Session Equipment Utilized During Treatment: Gait belt Activity Tolerance: Patient tolerated treatment well Patient left: in bed;with call bell/phone within reach;with family/visitor present      Functional Assessment Tool Used: Clinical Judgement Functional Limitation: Mobility: Walking and moving around Mobility: Walking and Moving Around Current Status [id number]): At least 20 percent but less than 40 percent impaired, limited or restricted Mobility: Walking and Moving Around Goal Status [phone]): At least 1 percent but less than 20 percent impaired, limited or restricted    Time: 1002-1034 PT Time Calculation (min) (ACUTE ONLY): 32 min   Charges:   PT Evaluation $Initial PT Evaluation Tier I: 1 Procedure PT Treatments $Gait Training: 8-22 mins  PT G Codes:   PT G-Codes **NOT FOR INPATIENT CLASS** Functional Assessment Tool Used: Clinical Judgement Functional Limitation: Mobility: Walking and moving around Mobility: Walking and Moving Around Current Status [id number]): At least 20 percent but less than 40 percent impaired, limited or restricted Mobility: Walking and Moving Around Goal Status [phone]): At least 1 percent but less than 20 percent impaired, limited or restricted     [facility] 09/15/2015, 12:05 PM  Magda Kiel, [place] 09/15/2015

## 2015-09-15 NOTE — Progress Notes (Signed)
STROKE TEAM PROGRESS NOTE   HISTORY SKLYER BAGE is an 74 y.o. male hx of CAD, NSTEMI, HLD admitted with transient left sided numbness and weakness. Reports initial onset of LUE paresthesias, followed by a heavy sensation in his LLE. LUE symptoms have fully resolved, reports minimal heavy sensation in his LLE. No speech deficits, no visual deficits. No prior CVA or TIA. Takes Plavix 75mg  daily. CT head imaging reviewed, no acute process noted. HE WAS last known well 09/14/15 at 1230. Modified Rankin: Rankin Score=0. Patient was not administered TPA secondary to symptoms resolved upon arrival to ED. He was admitted for further evaluation and treatment.   SUBJECTIVE (INTERVAL HISTORY) His wife is at the bedside.  Overall he feels his condition is gradually improving. He reports he still has some shakiness in his L leg when he sits on the side of the bed. And walking wobbly.    OBJECTIVE Temp:  [98.1 F (36.7 C)-98.7 F (37.1 C)] 98.3 F (36.8 C) (12/06 0800) Pulse Rate:  [49-66] 62 (12/06 0800) Cardiac Rhythm:  [-] Sinus bradycardia;Heart block (12/05 2342) Resp:  [16-24] 18 (12/06 0800) BP: (115-174)/(57-84) 161/76 mmHg (12/06 0800) SpO2:  [95 %-100 %] 98 % (12/06 0800) Weight:  [76.8 kg (169 lb 5 oz)] 76.8 kg (169 lb 5 oz) (12/05 2000)  CBC:   Recent Labs Lab 09/14/15 1423  WBC 4.6  NEUTROABS 2.3  HGB 12.9*  HCT 38.7*  MCV 100.8*  PLT [id number]    Basic Metabolic Panel:   Recent Labs Lab 09/14/15 1423  NA 141  K 4.1  CL 108  CO2 26  GLUCOSE 109*  BUN 12  CREATININE 0.87  CALCIUM 8.8*    Lipid Panel:     Component Value Date/Time   CHOL 112 09/15/2015 0205   TRIG 109 09/15/2015 0205   HDL 42 09/15/2015 0205   CHOLHDL 2.7 09/15/2015 0205   VLDL 22 09/15/2015 0205   LDLCALC 48 09/15/2015 0205   HgbA1c:  Lab Results  Component Value Date   HGBA1C 5.8* 07/13/2013   Urine Drug Screen: No results found for: LABOPIA, COCAINSCRNUR, LABBENZ, AMPHETMU, THCU, LABBARB     IMAGING  Dg Chest 2 View 09/14/2015  No evidence of acute cardiopulmonary disease.   Ct Head Wo Contrast 09/14/2015  No acute finding.  Unremarkable head CT for age.    MRI HEAD  09/15/2015  1. 7 mm acute ischemic infarct within the right corona radiata. No associated hemorrhage or mass effect. 2. Age-related cerebral atrophy with mild chronic small vessel ischemic disease.   MRA HEAD  09/15/2015  Negative intracranial MRA without large or proximal arterial branch occlusion. No high-grade or correctable stenosis.    2D Echocardiogram  - Left ventricle: The cavity size was mildly dilated. Wall thickness was normal. Systolic function was normal. The estimated ejection fraction was in the range of 55% to 60%. - Aortic valve: There was trivial regurgitation. - Mitral valve: There was mild regurgitation. - Left atrium: The atrium was mildly dilated. - Atrial septum: No defect or patent foramen ovale was identified.  Carotid Doppler   Bilateral ICA's 1-39%.  PHYSICAL EXAM General - Well nourished, well developed, in no apparent distress.  Cardiovascular - Regular rate and rhythm with no murmur.  Mental Status -  Level of arousal and orientation to time, place, and person were intact. Language including expression, naming, repetition, comprehension was assessed and found intact. Attention span and concentration were normal. Recent and remote memory were intact.  Cranial Nerves II - XII - II - Visual field intact OU. III, IV, VI - Extraocular movements intact. V - Facial sensation intact bilaterally. VII - Facial movement intact bilaterally. VIII - HOH (OLD) X - Palate elevates symmetrically. XI - Chin turning & shoulder shrug intact bilaterally. XII - Tongue protrusion intact.  Motor Strength - The patient's strength was normal in UEs. Left leg drift.  Motor Tone - Muscle tone was assessed at the neck and appendages and was normal.  Sensory - Light touch was  symmetrical.    Coordination - mild LUE ataxia FTN and mirror test, moderate LLE ataxia with heel to shin. Tremor was absent.   ASSESSMENT/PLAN Mr. LEMARION WALSINGHAM is a 74 y.o. male with history of CAD, NTEMI w/ DES, HLD presenting with transient left sided numbness and weakness. He did not receive IV t-PA due to symptoms resolved.   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 ALLERGIC TO ASPIRIN  Ongoing aggressive stroke risk factor management  Therapy recommendations:  pending   Disposition:  pending   Hypertension  Stable Permissive hypertension (OK if < 220/120) but gradually normalize in 5-7 days  Hyperlipidemia  Home meds:  lipitor 40, resumed in hospital  LDL 48, goal < 70  Continue statin at discharge  Other Stroke Risk Factors  Advanced age  ETOH use  Coronary artery disease - STEMI w/ PCI, DES 2014  Other Active Problems  GERD  Anxiety  Hx L ankle fx w/ rod placement 2009  Snores, needs OP sleep evaluation  Hospital day #   [place] for Pager information 09/15/2015 10:18 AM   I, the attending vascular neurologist, have personally obtained a history, examined the patient, evaluated laboratory data, individually viewed imaging studies and agree with radiology interpretations. I obtained additional history from pt's wife at bedside. I also discussed with pt and family and Dr. Algis Liming regarding his care plan. Together with the NP/PA, we  formulated the assessment and plan of care which reflects our mutual decision.  I have made any additions or clarifications directly to the above note and agree with the findings and plan as currently documented.   74 yo M with hx of HTN, CAD s/p stent and HLD admitted for left sided weakness, symptoms improving. MRI showed right CR small infarct, likely small vessel disease. Stroke work up negative including MRA, CUS and TTE. LDL 48 and A1C pending. Found to have s/s of OSA. He needs outpt sleep study and potential CPAP treatment. Continue plavix and statin.   Rosalin Hawking, MD PhD Stroke Neurology 09/15/2015 5:12 PM       To contact Stroke Continuity provider, please refer to [email or web address] After hours, contact General Neurology

## 2015-09-15 NOTE — Progress Notes (Signed)
Preliminary results by tech-Carotid Duplex completed. Bilateral ICA's 1-39%.  Matthew Liu ,RVT

## 2015-09-15 NOTE — Progress Notes (Signed)
Echocardiogram 2D Echocardiogram has been performed.  Joelene Millin 09/15/2015, 11:20 AM

## 2015-09-16 DIAGNOSIS — I639 Cerebral infarction, unspecified: Secondary | ICD-10-CM

## 2015-09-16 DIAGNOSIS — I10 Essential (primary) hypertension: Secondary | ICD-10-CM

## 2015-09-16 DIAGNOSIS — E785 Hyperlipidemia, unspecified: Secondary | ICD-10-CM

## 2015-09-16 LAB — GLUCOSE, CAPILLARY
Glucose-Capillary: 106 mg/dL — ABNORMAL HIGH (ref 65–99)
Glucose-Capillary: 132 mg/dL — ABNORMAL HIGH (ref 65–99)

## 2015-09-16 LAB — HEMOGLOBIN A1C
Hgb A1c MFr Bld: 5.8 % — ABNORMAL HIGH (ref 4.8–5.6)
Mean Plasma Glucose: 120 mg/dL

## 2015-09-16 MED ORDER — QUINAPRIL HCL 40 MG PO TABS: 40.0000 mg | ORAL_TABLET | Freq: Every evening | ORAL | Status: DC

## 2015-09-16 NOTE — Discharge Summary (Signed)
Physician Discharge Summary  Matthew Liu [id number] DOB: [birth year] DOA: 09/14/2015  PCP: Nicoletta Dress, MD  Admit date: 09/14/2015 Discharge date: 09/16/2015  Time spent: 35 minutes  Recommendations for Outpatient Follow-up:  1. Follow-up with neurology in 2-4 weeks 2. Home with PT   Discharge Diagnoses:  Principal Problem:   Acute ischemic stroke [facility]) Active Problems:   HTN (hypertension)   GERD (gastroesophageal reflux disease)   CAD in native artery   Aspirin allergy   Dyslipidemia   Ankle fracture, left   OSA (obstructive sleep apnea)   Discharge Condition: Stable  Diet recommendation: Heart healthy  Filed Weights   09/14/15 2000  Weight: 76.8 kg (169 lb 5 oz)    History of present illness:  74 year old male with past medical history coronary artery sees with a heart attack in 2014 status post 2 drug-eluting stent, who presented to the [facility] with left-sided numbness and weakness.  Hospital Course:  Acute ischemic stroke [facility]) CVA to the right corona radiate. CT was negative for bleed, MRI of the brain confirmed a 7 mm right CVA, carotid Doppler shows less than 40% stenosis bilaterally. 2-D echo showed an EF of 55%. LDL was 48 continue Lipitor., hemoglobin A1c 5.8. Patient was previously on Plavix as he has an allergy to aspirin will continue Plavix. Physical therapy evaluated the patient and recommended home health. Follow-up with neurology as an outpatient.  Essential hypertension: No changes were made to his medication, he will restart them in a week.  Macrocytic anemia: Follow-up with PCP as an outpatient.  Procedures:  CT of the head without contrast 12.5  Chest x-ray 12.5  MRI of brain 12.5  MRA of head 12.5  2-D echo 12.6  Doppler 12.6  Consultations:  NEurology  Discharge Exam: Filed Vitals:   09/16/15 0201 09/16/15 0515  BP: 119/55 129/75  Pulse: 47 52  Temp: 97.8 F (36.6 C) 98 F (36.7 C)  Resp: 16 16     General: A&O x3 Cardiovascular: RRR Respiratory: good air movement CTA B/L  Discharge Instructions    Current Discharge Medication List    CONTINUE these medications which have NOT CHANGED   Details  acetaminophen (TYLENOL) 500 MG tablet Take 500 mg by mouth once as needed for headache.    allopurinol (ZYLOPRIM) 300 MG tablet Take 300 mg by mouth every morning.     ALPRAZolam (XANAX) 0.25 MG tablet Take 0.25 mg by mouth 3 (three) times daily as needed for anxiety.     atorvastatin (LIPITOR) 80 MG tablet TAKE 1 TABLET BY MOUTH EVERY EVENING Qty: 30 tablet, Refills: 5    clopidogrel (PLAVIX) 75 MG tablet TAKE 1 TABLET BY MOUTH EVERY DAY Qty: 30 tablet, Refills: 5    esomeprazole (NEXIUM) 40 MG capsule Take 40 mg by mouth daily at 12 noon.    metoprolol tartrate (LOPRESSOR) 25 MG tablet TAKE 1 TABLET BY MOUTH TWICE A DAY Qty: 60 tablet, Refills: 3    Multiple Vitamins-Minerals (SENIOR MULTIVITAMIN PLUS PO) Take 1 tablet by mouth every morning.     quinapril (ACCUPRIL) 40 MG tablet Take 40 mg by mouth every evening.     nitroGLYCERIN (NITROSTAT) 0.4 MG SL tablet Place 1 tablet (0.4 mg total) under the tongue every 5 (five) minutes as needed for chest pain (up to 3 doses). Qty: 25 tablet, Refills: 4       Allergies  Allergen Reactions  . Aspirin Hives    "Shortness of Breath" can't breathe  .  Quinine Derivatives Hives    "look like he has the measels"      The results of significant diagnostics from this hospitalization (including imaging, microbiology, ancillary and laboratory) are listed below for reference.    Significant Diagnostic Studies: Dg Chest 2 View  09/14/2015  CLINICAL DATA:  Stroke EXAM: CHEST  2 VIEW COMPARISON:  07/09/2014 FINDINGS: Lungs are clear.  No pleural effusion or pneumothorax. The heart is top-normal in size. Degenerative changes of the visualized thoracolumbar spine. IMPRESSION: No evidence of acute cardiopulmonary disease.  Electronically Signed   By: Julian Hy M.D.   On: 09/14/2015 21:03   Ct Head Wo Contrast  09/14/2015  CLINICAL DATA:  Left-sided numbness and weakness, now resolved EXAM: CT HEAD WITHOUT CONTRAST TECHNIQUE: Contiguous axial images were obtained from the base of the skull through the vertex without intravenous contrast. COMPARISON:  07/07/2008 FINDINGS: Skull and Sinuses:Negative for fracture or destructive process. The visualized mastoids, middle ears, and imaged paranasal sinuses are clear. Orbits: No acute abnormality. Brain: No evidence of acute infarction, hemorrhage, hydrocephalus, or mass lesion/mass effect. Mild progression of generalized cerebral volume loss, expected given long interval and patient age. Dilated perivascular spaces noted along the lower right sylvian fissure. No unusual ischemic change for age. IMPRESSION: No acute finding.  Unremarkable head CT for age. Electronically Signed   By: Monte Fantasia M.D.   On: 09/14/2015 15:57   Mr Brain Wo Contrast  09/15/2015  CLINICAL DATA:  Initial evaluation for acute left-sided numbness and weakness. EXAM: MRI HEAD WITHOUT CONTRAST MRA HEAD WITHOUT CONTRAST TECHNIQUE: Multiplanar, multiecho pulse sequences of the brain and surrounding structures were obtained without intravenous contrast. Angiographic images of the head were obtained using MRA technique without contrast. COMPARISON:  Prior CT from 09/14/2015. FINDINGS: MRI HEAD FINDINGS There is a small focus of restricted diffusion involving the periventricular white matter of the right Corona radiata with slight inferior extension (Series 3, image 30). This measures up to 7 mm. Associated signal loss on ADC map. Minimal T2/FLAIR hyperintensity present. Findings consistent with a small acute ischemic infarct. No associated hemorrhage or mass effect. No other infarct. No acute or chronic intracranial hemorrhage. Normal intravascular flow voids are maintained. Mild diffuse prominence of the  CSF containing spaces is compatible with generalized cerebral atrophy. Minimal confluent T2/FLAIR hyperintensity within the periventricular white matter most likely related to chronic small vessel ischemic disease. Minimal small vessels changes present within the pons as well. No mass lesion, midline shift, or mass effect. No hydrocephalus. No extra-axial fluid collection. Craniocervical junction within normal limits. Degenerative spondylolysis noted within the visualized upper cervical spine without significant stenosis. Pituitary gland within normal limits. No acute abnormality about the orbits. Sequela prior bilateral lens extraction noted. Scattered mucosal thickening within the ethmoidal air cells, maxillary sinuses, and frontal sinuses. No air-fluid levels to suggest active sinus infection. No significant mastoid effusion. Inner ear structures grossly normal. MRA HEAD FINDINGS ANTERIOR CIRCULATION: Distal cervical segments of the internal carotid arteries are patent with antegrade flow. Petrous segments are widely patent. Cavernous and supraclinoid segments well opacified bilaterally. A1 segments patent. Anterior communicating artery normal. Anterior cerebral arteries opacified to their distal aspects. M1 segments patent without stenosis or occlusion. MCA bifurcations normal. Distal MCA branches well opacified and symmetric. Mild distal branch atheromatous irregularity within the distal left MCA branches. POSTERIOR CIRCULATION: Vertebral arteries patent to the vertebrobasilar junction. Right vertebral artery slightly dominant. Basilar artery well opacified. Dominant right anterior inferior cerebral artery noted. Superior cerebral arteries  opacified bilaterally. Both posterior cerebral arteries arise from the basilar artery and are well opacified to their distal aspects. No aneurysm or vascular malformation. IMPRESSION: MRI HEAD IMPRESSION: 1. 7 mm acute ischemic infarct within the right corona radiata. No  associated hemorrhage or mass effect. 2. Age-related cerebral atrophy with mild chronic small vessel ischemic disease. MRA HEAD IMPRESSION: Negative intracranial MRA without large or proximal arterial branch occlusion. No high-grade or correctable stenosis. Electronically Signed   By: Jeannine Boga M.D.   On: 09/15/2015 04:56   Mr Jodene Nam Head/brain Wo Cm  09/15/2015  CLINICAL DATA:  Initial evaluation for acute left-sided numbness and weakness. EXAM: MRI HEAD WITHOUT CONTRAST MRA HEAD WITHOUT CONTRAST TECHNIQUE: Multiplanar, multiecho pulse sequences of the brain and surrounding structures were obtained without intravenous contrast. Angiographic images of the head were obtained using MRA technique without contrast. COMPARISON:  Prior CT from 09/14/2015. FINDINGS: MRI HEAD FINDINGS There is a small focus of restricted diffusion involving the periventricular white matter of the right Corona radiata with slight inferior extension (Series 3, image 30). This measures up to 7 mm. Associated signal loss on ADC map. Minimal T2/FLAIR hyperintensity present. Findings consistent with a small acute ischemic infarct. No associated hemorrhage or mass effect. No other infarct. No acute or chronic intracranial hemorrhage. Normal intravascular flow voids are maintained. Mild diffuse prominence of the CSF containing spaces is compatible with generalized cerebral atrophy. Minimal confluent T2/FLAIR hyperintensity within the periventricular white matter most likely related to chronic small vessel ischemic disease. Minimal small vessels changes present within the pons as well. No mass lesion, midline shift, or mass effect. No hydrocephalus. No extra-axial fluid collection. Craniocervical junction within normal limits. Degenerative spondylolysis noted within the visualized upper cervical spine without significant stenosis. Pituitary gland within normal limits. No acute abnormality about the orbits. Sequela prior bilateral lens  extraction noted. Scattered mucosal thickening within the ethmoidal air cells, maxillary sinuses, and frontal sinuses. No air-fluid levels to suggest active sinus infection. No significant mastoid effusion. Inner ear structures grossly normal. MRA HEAD FINDINGS ANTERIOR CIRCULATION: Distal cervical segments of the internal carotid arteries are patent with antegrade flow. Petrous segments are widely patent. Cavernous and supraclinoid segments well opacified bilaterally. A1 segments patent. Anterior communicating artery normal. Anterior cerebral arteries opacified to their distal aspects. M1 segments patent without stenosis or occlusion. MCA bifurcations normal. Distal MCA branches well opacified and symmetric. Mild distal branch atheromatous irregularity within the distal left MCA branches. POSTERIOR CIRCULATION: Vertebral arteries patent to the vertebrobasilar junction. Right vertebral artery slightly dominant. Basilar artery well opacified. Dominant right anterior inferior cerebral artery noted. Superior cerebral arteries opacified bilaterally. Both posterior cerebral arteries arise from the basilar artery and are well opacified to their distal aspects. No aneurysm or vascular malformation. IMPRESSION: MRI HEAD IMPRESSION: 1. 7 mm acute ischemic infarct within the right corona radiata. No associated hemorrhage or mass effect. 2. Age-related cerebral atrophy with mild chronic small vessel ischemic disease. MRA HEAD IMPRESSION: Negative intracranial MRA without large or proximal arterial branch occlusion. No high-grade or correctable stenosis. Electronically Signed   By: Jeannine Boga M.D.   On: 09/15/2015 04:56    Microbiology: No results found for this or any previous visit (from the past 240 hour(s)).   Labs: Basic Metabolic Panel:  Recent Labs Lab 09/14/15 1423  NA 141  K 4.1  CL 108  CO2 26  GLUCOSE 109*  BUN 12  CREATININE 0.87  CALCIUM 8.8*   Liver Function Tests: No results for  input(s): AST, ALT, ALKPHOS, BILITOT, PROT, ALBUMIN in the last 168 hours. No results for input(s): LIPASE, AMYLASE in the last 168 hours. No results for input(s): AMMONIA in the last 168 hours. CBC:  Recent Labs Lab 09/14/15 1423  WBC 4.6  NEUTROABS 2.3  HGB 12.9*  HCT 38.7*  MCV 100.8*  PLT 157   Cardiac Enzymes:  Recent Labs Lab 09/14/15 2118 09/15/15 0205 09/15/15 0852  TROPONINI <0.03 <0.03 <0.03   BNP: BNP (last 3 results) No results for input(s): BNP in the last 8760 hours.  ProBNP (last 3 results) No results for input(s): PROBNP in the last 8760 hours.  CBG:  Recent Labs Lab 09/15/15 0717 09/15/15 1116 09/15/15 1619 09/15/15 2220 09/16/15 0651  GLUCAP 102* 109* 104* 125* 106*    Signed:  Charlynne Cousins  [facility] 09/16/2015, 10:22 AM

## 2015-09-16 NOTE — Progress Notes (Signed)
Occupational Therapy Evaluation/Discharge Patient Details Name: Matthew Liu MRN: [id number] DOB: [birth year] Today's Date: 09/16/2015    History of Present Illness Matthew Liu is a 74 y.o. 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  Patient positive for right corona radiata infarct    Clinical Impression   PTA, pt was independent with all ADL and mobility. Pt currently presents with mild LUE and LLE weakness and some slow cognitive processing. Pt's wife reports that pt still seems confused or "not himself" at times. Pt completed all functional transfers and self-care tasks at [place] I level. Discussed signs and symptoms of stroke and fall prevention strategies. No OT follow up recommended. Pt has no further acute OT needs. OT signing off.     Follow Up Recommendations  No OT follow up    Equipment Recommendations  3 in 1 bedside comode (if unable to borrow from family/friends)    Recommendations for Other Services       Precautions / Restrictions Precautions Precautions: Fall Restrictions Weight Bearing Restrictions: No      Mobility Bed Mobility Overal bed mobility: Modified Independent             General bed mobility comments: HOB flat, no use of bedrails to simulate home environment  Transfers Overall transfer level: Needs assistance Equipment used: None Transfers: Sit to/from Stand Sit to Stand: Supervision         General transfer comment: Supervision for safety, no physical assist needed. No overt LOB.    Balance Overall balance assessment: Needs assistance Sitting-balance  support: No upper extremity supported;Feet supported Sitting balance-Leahy Scale: Normal     Standing balance support: No upper extremity supported;During functional activity Standing balance-Leahy Scale: Fair Standing balance comment: No LOB or leaning noted                            ADL Overall ADL's : Needs assistance/impaired     Grooming: Oral care;Wash/dry face;Wash/dry hands;Modified independent;Standing           Upper Body Dressing : Sitting;Modified independent   Lower Body Dressing: Modified independent;Sit to/from stand   Toilet Transfer: Supervision/safety;Ambulation;Regular Toilet;Grab bars   Toileting- Clothing Manipulation and Hygiene: Supervision/safety;Sit to/from stand   Tub/ Shower Transfer: Tub transfer;Supervision/safety;Ambulation   Functional mobility during ADLs: Supervision/safety General ADL Comments: Pt mod I with grooming and dressing tasks. Pt required supervision for ambulation and tub transfer due to L foot dragging some which pt is acutely aware of. Pt demonstrated some slow cognitive processing when asked questions or to follow commands and wife reports pt still seems confused or "not himself" at times. Educated/reviewed stroke signs and symptoms. Discussed fall prevention strategies which pt's wife says she has already taken care of.      Vision Vision Assessment?: No apparent visual deficits   Perception     Praxis      Pertinent Vitals/Pain Pain Assessment: No/denies pain     Hand Dominance Right   Extremity/Trunk Assessment Upper Extremity Assessment Upper Extremity Assessment: LUE deficits/detail LUE Deficits / Details: slight decreased ROM in shoulder but able to achieve full ROM with verbal cues. Triceps 4/5, biceps 4+/5; SENSATION: intact  LUE Coordination: decreased gross motor   Lower Extremity Assessment Lower Extremity Assessment: LLE deficits/detail LLE Deficits / Details: decreased dorsiflexion strength -  foot dragging occassionally while ambulating   Cervical / Trunk Assessment Cervical / Trunk Assessment: Normal   Communication Communication Communication: No difficulties   Cognition Arousal/Alertness: Awake/alert Behavior During Therapy: WFL for tasks assessed/performed Overall Cognitive Status: Impaired/Different from baseline Area of Impairment: Problem solving             Problem Solving: Slow processing General Comments: Pt's wife reports that pt still seems confused at times. Noted to have slow processing and some confusion when responding to questions or following commands.    General Comments       Exercises       Shoulder Instructions      Home Living Family/patient expects to be discharged to:: Private residence Living Arrangements: Spouse/significant other Available Help at Discharge: Family Type of Home: House Home Access: Stairs to enter [employer] of Steps: 2 at front no rail, 12 in back with rails   Home Layout: One level     Bathroom Shower/Tub: Tub/shower unit Shower/tub characteristics: Architectural technologist: [place]: Environmental consultant - 2 wheels;Shower seat   Additional Comments: May borrow 3in1 from neighbor or daughter      Prior Functioning/Environment Level of Independence: Independent             OT Diagnosis:     OT Problem List: Decreased strength;Decreased range of motion;Decreased activity tolerance;Impaired balance (sitting and/or standing);Decreased coordination;Decreased safety awareness;Decreased knowledge of use of DME or AE   OT Treatment/Interventions:      OT Goals(Current goals can be found in the care plan section) Acute Rehab OT Goals Patient Stated Goal: to go home OT Goal Formulation: With patient Time For Goal Achievement: 09/30/15 Potential to Achieve Goals: Good  OT Frequency:     Barriers to D/C:            Co-evaluation              End of Session Equipment  Utilized During Treatment: Gait belt Nurse Communication: Mobility status  Activity Tolerance: Patient tolerated treatment well Patient left: in bed;with call bell/phone within reach;with bed alarm set;with family/visitor present   Time: [id number] OT Time Calculation (min): 31 min Charges:  OT General Charges $OT Visit: 1 Procedure OT Evaluation $Initial OT Evaluation Tier I: 1 Procedure OT Treatments $Self Care/Home Management : 8-22 mins G-Codes:    Redmond Baseman, OTR/L 29-Sep-2015, 10:10 AM

## 2015-09-16 NOTE — Progress Notes (Signed)
Diacharged to home via w/c via car . EMMI consult and paper filled out. Pt understood all of discharge instructions.

## 2015-09-16 NOTE — Progress Notes (Addendum)
Physical Therapy Treatment Patient Details Name: Matthew Liu MRN: [id number] DOB: [birth year] Today's Date: 09/16/2015    History of Present Illness Matthew Liu is a 74 y.o. 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  Patient positive for right corona radiata infarct     PT Comments    Patient progressing, but not yet safe for gait without walker.  Felt he was better at bending the ankle on L with increased practice walking, but still catches the L foot at times.  Reviewed stroke warning signs with pt and wife and discussed fitness program for risk factor modification, but encouraged clearance with cardiologist first.  Will continue to benefit from skilled PT via HHPT at d/c.  Follow Up Recommendations  Home health PT;Supervision for mobility/OOB     Equipment Recommendations  Rolling walker with 5" wheels    Recommendations for Other Services       Precautions / Restrictions Precautions Precautions: Fall Restrictions Weight Bearing Restrictions: No    Mobility  Bed Mobility Overal bed mobility: Modified Independent             General bed mobility comments: sitting EOB  Transfers Overall transfer level: Modified independent Equipment used: None Transfers: Sit to/from Stand Sit to Stand: Modified independent (Device/Increase time)         General transfer comment: Supervision for safety, no physical assist needed. No overt LOB.  Ambulation/Gait Ambulation/Gait assistance: Min guard;Min assist Ambulation Distance (Feet): 250 Feet Assistive device: Straight cane Gait Pattern/deviations:  Step-through pattern     General Gait Details: catches L foot on floor several times with assist for balance.  Educated would need to continue using the walker at home due to fall risk remains with cane.   Stairs            Wheelchair Mobility    Modified Rankin (Stroke Patients Only) Modified Rankin (Stroke Patients Only) Pre-Morbid Rankin Score: No symptoms Modified Rankin: Moderately severe disability     Balance Overall balance assessment: Needs assistance Sitting-balance support: No upper extremity supported;Feet supported Sitting balance-Leahy Scale: Normal     Standing balance support: No upper extremity supported;During functional activity Standing balance-Leahy Scale: Fair Standing balance comment: No LOB or leaning noted             High level balance activites: Backward walking;Side stepping High Level Balance Comments: forward and back marching, stepping forward and to side in and out of box (tile square) with HHA, tandem gait forward    Cognition Arousal/Alertness: Awake/alert Behavior During Therapy: WFL for tasks assessed/performed Overall Cognitive Status: Impaired/Different from baseline Area of Impairment: Problem solving             Problem Solving: Slow processing General Comments: Pt's wife reports that pt still seems confused at times. Noted to have slow processing and some confusion when responding to questions or following commands.     Exercises      General Comments        Pertinent Vitals/Pain Pain Assessment: No/denies pain    Home Living Family/patient expects to be discharged to:: Private residence Living Arrangements: Spouse/significant other Available Help at Discharge: Family Type of Home: House Home Access: Stairs to enter  Home Layout: One level Home Equipment: Walker - 2 wheels;Shower seat Additional Comments: May borrow 3in1 from neighbor or daughter    Prior Function Level of Independence: Independent           PT Goals (current goals can now be found in the care plan section) Acute Rehab PT Goals Patient Stated Goal: to go home Progress towards PT goals: Progressing toward goals    Frequency  Min 4X/week    PT Plan Current plan remains appropriate    Co-evaluation             End of Session   Activity Tolerance: Patient tolerated treatment well Patient left: in bed;with call bell/phone within reach;with family/visitor present     Time: 1002-1035 PT Time Calculation (min) (ACUTE ONLY): 33 min  Charges:  $Gait Training: 8-22 mins $Neuromuscular Re-education: 8-22 mins                    G Codes:      WYNN,CYNDI 28-Sep-2015, 11:02 AM  Magda Kiel, [place] 28-Sep-2015

## 2015-09-16 NOTE — Care Management Note (Signed)
Case Management Note  Patient Details  Name: Matthew Liu MRN: [id number] Date of Birth: 12/05/1940  Subjective/Objective:                    Action/Plan: Patient being discharged home today with home health services orders. CM met with the patient and his wife and provided them a list of home health agencies in the [facility] area. They selected [facility] home health. CM spoke with [facility] home health and faxed them the information they requested. Patient also recommended to have a walker and a 3 in 1. The patient states he has a walker but needs the 3 in 1. [name] with [employer] notified and he is going to deliver the equipment to the room. Bedside RN updated.   Expected Discharge Date:                  Expected Discharge Plan:  [place]  In-House Referral:     Discharge planning Services  CM Consult  Post Acute Care Choice:  Durable Medical Equipment, Home Health Choice offered to:  Patient, Spouse  DME Arranged:  3-N-1 DME Agency:  [place]:  PT [place]:  [place]  Status of Service:  Completed, signed off  Medicare Important Message Given:    Date Medicare IM Given:    Medicare IM give by:    Date Additional Medicare IM Given:    Additional Medicare Important Message give by:     If discussed at [place] of Stay Meetings, dates discussed:    Additional Comments:  Pollie Friar, RN 09/16/2015, 11:14 AM

## 2015-09-16 NOTE — Progress Notes (Signed)
STROKE TEAM PROGRESS NOTE   SUBJECTIVE (INTERVAL HISTORY) Wife at bedside. Currently working with OT who does not think he will have any OT needs. Anticipates d/c today.   OBJECTIVE Temp:  [97.7 F (36.5 C)-98.2 F (36.8 C)] 98 F (36.7 C) (12/07 0515) Pulse Rate:  [47-62] 52 (12/07 0515) Cardiac Rhythm:  [-] Normal sinus rhythm (12/06 1900) Resp:  [16-20] 16 (12/07 0515) BP: (119-165)/(55-75) 129/75 mmHg (12/07 0515) SpO2:  [97 %-100 %] 98 % (12/07 0515)  CBC:   Recent Labs Lab 09/14/15 1423  WBC 4.6  NEUTROABS 2.3  HGB 12.9*  HCT 38.7*  MCV 100.8*  PLT [id number]    Basic Metabolic Panel:   Recent Labs Lab 09/14/15 1423  NA 141  K 4.1  CL 108  CO2 26  GLUCOSE 109*  BUN 12  CREATININE 0.87  CALCIUM 8.8*    Lipid Panel:     Component Value Date/Time   CHOL 112 09/15/2015 0205   TRIG 109 09/15/2015 0205   HDL 42 09/15/2015 0205   CHOLHDL 2.7 09/15/2015 0205   VLDL 22 09/15/2015 0205   LDLCALC 48 09/15/2015 0205   HgbA1c:  Lab Results  Component Value Date   HGBA1C 5.8* 09/15/2015   Urine Drug Screen: No results found for: LABOPIA, COCAINSCRNUR, LABBENZ, AMPHETMU, THCU, LABBARB    IMAGING  Dg Chest 2 View 09/14/2015  No evidence of acute cardiopulmonary disease.   Ct Head Wo Contrast 09/14/2015  No acute finding.  Unremarkable head CT for age.    MRI HEAD  09/15/2015  1. 7 mm acute ischemic infarct within the right corona radiata. No associated hemorrhage or mass effect. 2. Age-related cerebral atrophy with mild chronic small vessel ischemic disease.   MRA HEAD  09/15/2015  Negative intracranial MRA without large or proximal arterial branch occlusion. No high-grade or correctable stenosis.    2D Echocardiogram  - Left ventricle: The cavity size was mildly dilated. Wall thickness was normal. Systolic function was normal. The estimated ejection fraction was in the range of 55% to 60%. - Aortic valve: There was trivial regurgitation. - Mitral  valve: There was mild regurgitation. - Left atrium: The atrium was mildly dilated. - Atrial septum: No defect or patent foramen ovale was identified.  Carotid Doppler   Bilateral ICA's 1-39%.   PHYSICAL EXAM General - Well nourished, well developed, in no apparent distress.  Cardiovascular - Regular rate and rhythm with no murmur.  Mental Status -  Level of arousal and orientation to time, place, and person were intact. Language including expression, naming, repetition, comprehension was assessed and found intact. Attention span and concentration were normal. Recent and remote memory were intact.  Cranial Nerves II - XII - II - Visual field intact OU. III, IV, VI - Extraocular movements intact. V - Facial sensation intact bilaterally. VII - Facial movement intact bilaterally. VIII - HOH (OLD) X - Palate elevates symmetrically. XI - Chin turning & shoulder shrug intact bilaterally. XII - Tongue protrusion intact.  Motor Strength - The patient's strength was normal in UEs. Left leg drift.  Motor Tone - Muscle tone was assessed at the neck and appendages and was normal.  Sensory - Light touch was symmetrical.    Coordination - mild LUE ataxia FTN and mirror test, moderate LLE ataxia with heel to shin. Tremor was absent.   ASSESSMENT/PLAN Matthew Liu is a 75 y.o. male with history of CAD, NTEMI w/ DES, HLD presenting with transient left sided numbness and  weakness. He did not receive IV t-PA due to symptoms resolved.   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 ALLERGIC TO ASPIRIN  Ongoing aggressive stroke risk factor management  Therapy recommendations:  HH PT  Disposition:  Return home with [facility] Patient has a 10-15% risk of having another stroke over the next year, the highest risk is within 2 weeks of the most recent stroke/TIA (risk of having a stroke following a stroke or TIA is the same). Ongoing risk factor control by Primary Care Physician Follow-up Stroke Clinic at [facility] in one month, order placed.  Hypertension  Stable Permissive hypertension (OK if < 220/120) but gradually normalize in 5-7 days  Hyperlipidemia  Home meds:  lipitor 40, resumed in hospital  LDL 48, goal < 70  Continue statin at discharge  Other Stroke Risk Factors  Advanced age  ETOH use  Coronary artery disease - STEMI w/ PCI, DES 2014  Other Active Problems  GERD  Anxiety  Hx L ankle fx w/ rod placement 2009  [place], needs OP sleep evaluation  Hospital day #   [place] for Pager information 09/16/2015 9:07 AM   I, the attending vascular neurologist, have personally obtained a history, examined the patient, evaluated laboratory data, individually viewed imaging studies and agree with radiology interpretations. Together with the NP/PA, we formulated the assessment and plan of care which reflects our mutual decision.  I have made any additions or clarifications directly to the above note and agree with the findings and plan as currently documented.   Pt neuro stable. Stroke work up complete. Continue plavix and statin. Need outpt sleep study for OSA evaluation.  Neurology will sign off. Please call with questions. Pt will follow up with NP at [facility] in about 1 month. Thanks for the consult.   Rosalin Hawking, MD PhD Stroke Neurology 09/16/2015 11:55 PM       To contact Stroke Continuity provider, please refer to  [email or web address] After hours, contact General Neurology

## 2015-09-18 ENCOUNTER — Other Ambulatory Visit: Payer: Self-pay

## 2015-09-18 NOTE — Patient Outreach (Signed)
[facility]) Care Management  09/18/2015  Matthew Liu [birth year] [id number]  SUBJECTIVE; Telephone call to patient regarding EMMI stoke transition program. HIPAA verified with patient. RNCM discussed with patient EMMI stroke program. Patient verbalized agreement to receive calls from [facility].  Patient states he is doing well. Patient states he took a walk around his neighborhood this morning. Patient denies feeling bad or having any new symptoms. Patient states he has an appointment scheduled with his primary MD on 09/30/15 and an appointment with the neurologist on 10/12/14.  Patient states home health physical therapy started services on yesterday through [place].  Patient states he has all of his medications. Patient states he was told by his neurologist to not take his blood pressure medication for 24 to 48 hours after getting home from hospital discharge.  Patient states the physical therapist checked his blood pressure on yesterday and it was somewhat elevated. Patient unable to remember the exact blood pressure reading.  Patient states his wife is checking his blood pressure daily. Patient states today's reading was 150/90.  Patient states he started taking 1/2 of his blood pressure medication today.  RNCM advised patient to contact his primary MD and discuss blood pressure reading so that primary doctor can advise him on his medication.  Patient verbalized understanding.  Patient states he received his 3 in 1 commode from [facility].  Patient states he has no complaints at this time. RNCM reviewed signs and symptoms of stroke with patient. Advised patient how to contact doctor after hours and advised to call 911 for stroke like symptoms.  RNCM advised patient to take his plavix as ordered by his doctor.  RNCM advised patient to be aware of bleeding such as bleeding gums, blood in urine and report these symptoms to his doctor.  RNCM advised patient to  continue to adhere to a heart healthy, low salt diet. Patient verbalized understanding. Patient verbalized agreement to next outreach call with RNCM.   ASSESSMENT: EMMI stroke program RED referral.   PLAN: RNCM will follow up with patient within 1 week.   Quinn Plowman RN,BSN,CCM [place] Coordinator [phone]

## 2015-09-21 ENCOUNTER — Other Ambulatory Visit: Payer: Self-pay

## 2015-09-21 NOTE — Patient Outreach (Addendum)
[name]) Care Management  09/21/2015  Matthew Liu 08-05-1941 [id number]  Telephone call to patient regarding EMMI stroke transition program follow up.  Unable to reach patient or leave voice message.  Phone only rang.  ASSESSMENT: EMMI stroke red referral (Day #3 stroke antiplatelets and anticoagulants)  PLAN: RNCM will attempt 2nd telephone outreach to patient within 1 week.   Quinn Plowman RN,BSN,CCM [place] Coordinator [phone]

## 2015-09-22 ENCOUNTER — Other Ambulatory Visit: Payer: Medicare Other

## 2015-09-22 NOTE — Patient Outreach (Signed)
[facility]) Care Management  09/22/2015  JORDYN RELLES April 06, 1941 [id number]   Second telephone call to patient regarding EMMI stroke program follow up.  Wife states patient is not at home presently.  Request call back.  PLAN; RNCM will attempt 3rd telephone outreach to patient within 1 week.  Quinn Plowman RN,BSN,CCM [place] Coordinator [phone]

## 2015-09-25 ENCOUNTER — Other Ambulatory Visit: Payer: Medicare Other

## 2015-09-25 NOTE — Patient Outreach (Signed)
[facility]) Care Management  09/25/2015  Matthew Liu 22-Sep-1941 [id number]   Telephone call to patient regarding EMMI stroke follow up.  Unable to reach patient. HIPAA compliant voice message left with call back phone number.   PLAN; RNCM will send patient outreach letter to attempt contact.  Quinn Plowman RN,BSN,CCM [place] Coordinator [phone]

## 2015-09-29 ENCOUNTER — Ambulatory Visit: Payer: Self-pay

## 2015-09-30 NOTE — Patient Outreach (Signed)
[facility]) Care Management  09/30/2015  TIMMY DWINELL [birth year] [id number]   Patient triggered RED on EMMI Stroke Dashboard, notification sent to Quinn Plowman, RN.  Thanks, Ronnell [place]. [place], [place] Assistant Phone: [phone] Fax: [phone]

## 2015-10-01 ENCOUNTER — Other Ambulatory Visit: Payer: Self-pay

## 2015-10-01 NOTE — Patient Outreach (Signed)
[facility]) Care Management  10/01/2015  CARDERO QUIZHPI 02-10-1941 [id number]   SUBJECTIVE:  Telephone call to patient regarding EMMI stroke transition program.  HIPAA verified with patient.  Patient states he is doing well.  Patient states he saw his primary MD on 09/30/15.  Patient states no changes in his plan of care were made. Patient states his home physical therapy ended on 09/30/15.  Patient states he will start outpatient physical therapy 10/13/15.  Patient states he is monitoring his blood pressure at home daily and recording. Reports today's blood pressure is 130/70.  Patient states he does not have any medical concerns at this time.  RNCM reviewed with patient signs/symptoms of stroke.  Patient advised to contact 911 for these symptoms.  RNCM advised patient to continue to monitor blood pressure and record.  Also, to adhere to low salt diet. RNCM advised patient to monitor for signs of bleeding due to taking blood thinner.  RNCM offered to send patient EMMI education material regarding stroke, hypertension, low salt diet. Patient refused stating he has information that was given to him at discharged from the hospital.  ASSESSMENT: EMMI stroke transition RED referral.  PLAN; RNCM will follow up with patient within 2 weeks.   Quinn Plowman RN,BSN,CCM [place] Coordinator [phone]

## 2015-10-06 ENCOUNTER — Telehealth: Payer: Self-pay | Admitting: Neurology

## 2015-10-06 NOTE — Telephone Encounter (Signed)
Wife called stating patient "finished up PT at home and is now doing therapy at the hospital, feels they had him go there to make the hospital money", states husband was told at therapy he could rake leaves and wife feels he is pushing his luck to be doing this, states he is driving, wife wants to speak to someone about this. Wife advised that we are unable to give medical advise until he is seen at New Patient appointment 10/13/15, advised to call back over to where husband did therapy at the hospital and speak to the person who gave ok to rake leaves to her husband or can contact patient care coordinator on unit at hospital where husband was a patient to let them advise or could contact PCP.

## 2015-10-13 ENCOUNTER — Encounter: Payer: Self-pay | Admitting: Neurology

## 2015-10-13 ENCOUNTER — Other Ambulatory Visit: Payer: Self-pay

## 2015-10-13 ENCOUNTER — Ambulatory Visit (INDEPENDENT_AMBULATORY_CARE_PROVIDER_SITE_OTHER): Payer: Medicare Other | Admitting: Neurology

## 2015-10-13 VITALS — BP 169/85 | HR 52 | Ht 69.5 in | Wt 170.8 lb

## 2015-10-13 DIAGNOSIS — I999 Unspecified disorder of circulatory system: Secondary | ICD-10-CM

## 2015-10-13 DIAGNOSIS — I679 Cerebrovascular disease, unspecified: Secondary | ICD-10-CM

## 2015-10-13 DIAGNOSIS — I6381 Other cerebral infarction due to occlusion or stenosis of small artery: Secondary | ICD-10-CM | POA: Insufficient documentation

## 2015-10-13 DIAGNOSIS — I639 Cerebral infarction, unspecified: Secondary | ICD-10-CM

## 2015-10-13 DIAGNOSIS — I10 Essential (primary) hypertension: Secondary | ICD-10-CM

## 2015-10-13 DIAGNOSIS — I739 Peripheral vascular disease, unspecified: Secondary | ICD-10-CM | POA: Insufficient documentation

## 2015-10-13 HISTORY — DX: Other cerebral infarction due to occlusion or stenosis of small artery: I63.81

## 2015-10-13 NOTE — Progress Notes (Signed)
[facility]    Provider:  Dr Jaynee Eagles Referring Provider: Nicoletta Dress, MD Primary Care Physician:  Nicoletta Dress, MD  CC:  right corona radiata infarct   HPI:  Matthew Liu is a 75 y.o. male here as a referral from Dr. [name] Bali for stroke. 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 He is feeling better. His arm is better. His left leg still feels uncoordinated. The numbness has improved as well. His left leg doesn't work like the right leg. He wants to run but they have advised him only to walk. His balance is good. He wants to move more to get better. He had home physical therapy. He is seeing a physical therapist at [facility]. Denies any weakness. His left leg just feels heavy. Walking helps with the feelings of heaviness in the left leg. No dysarthira or dysphagia, no aphasia, denies sensory changes. He is still on plavix, he is compliant with medications. He is on lipitor. He snores, he denies being tired during the day. He goes to bed around 11;30 and sleeps until 6:30am. No excessive daytime fatigue or morning headaches.   Reviewed notes, labs and imaging from outside physicians, which showed:  Personally reviewed all images and agree with the following:  Ct Head Wo Contrast 09/14/2015 No acute finding. Unremarkable head CT for age.   MRI HEAD  09/15/2015 1. 7 mm acute ischemic infarct within the right corona radiata. No associated hemorrhage or mass effect. 2. Age-related cerebral atrophy with mild chronic small vessel ischemic disease.   MRA HEAD  09/15/2015 Negative intracranial MRA without large or proximal arterial branch occlusion. No high-grade or correctable stenosis.   2D Echocardiogram  - Left ventricle: The cavity size was mildly dilated. Wall thickness was normal. Systolic function was normal. The estimated ejection fraction was in the  range of 55% to 60%. - Aortic valve: There was trivial regurgitation. - Mitral valve: There was mild regurgitation. - Left atrium: The atrium was mildly dilated. - Atrial septum: No defect or patent foramen ovale was identified.  Carotid Doppler  Bilateral ICA's 1-39%.  HgbA1c 5.8 LDL 48   Review of Systems: Patient complains of symptoms per HPI as well as the following symptoms: No CP, no SOB. Pertinent negatives per HPI. All others negative.   Social History   Social History  . Marital Status: Married    Spouse Name: Matthew Liu  . Number of Children: 2  . Years of Education: 12   Occupational History  . Department of Camera operator    Social History Main Topics  . Smoking status: Never Smoker   . Smokeless tobacco: Never Used  . Alcohol Use: Yes     Comment: occasional beer   . Drug Use: No  . Sexual Activity: Not Currently   Other Topics Concern  . Not on file   Social History Narrative   Lives with wife   Caffeine use: 1-2 cups coffee per day    Family History  Problem Relation Age of Onset  . Hypertension Mother   . Stroke Neg Hx     Past Medical History  Diagnosis Date  . Hypertension   . CAD (coronary artery disease)     a. Inf STEMI 07/2013: s/p DES x 2 from ostial to [facility] 07/13/13. Residual Cx disease for med rx (PCI if recurrent pain).  . Aspirin allergy     a. Anaphylactic rxn.  [name]  Dyslipidemia   . GERD (gastroesophageal reflux disease)   . Barrett's esophagus   . Hyperglycemia     a. A1C 5.8 in 07/2013.  [name] Myocardial infarction ([place])   . Anxiety   . ED (erectile dysfunction)   . Hydrocele of testis   . Ankle fracture, left 2009    rod placed    Past Surgical History  Procedure Laterality Date  . Left ankle surgery  1999  . Coronary angioplasty      stents   . Cataract surgery       bilateral   . Hydrocele excision Left 07/17/2014    Procedure: LEFT HYDROCELECTOMY;  Surgeon: Malka So, MD;  Location: [facility];  Service:  Urology;  Laterality: Left;  . Left heart catheterization with coronary angiogram N/A 07/13/2013    Procedure: LEFT HEART CATHETERIZATION WITH CORONARY ANGIOGRAM;  Surgeon: Sinclair Grooms, MD;  Location: [facility] CATH LAB;  Service: Cardiovascular;  Laterality: N/A;  . Percutaneous coronary stent intervention (pci-s)  07/13/2013    Procedure: PERCUTANEOUS CORONARY STENT INTERVENTION (PCI-S);  Surgeon: Sinclair Grooms, MD;  Location: [facility] CATH LAB;  Service: Cardiovascular;;    Current Outpatient Prescriptions  Medication Sig Dispense Refill  . acetaminophen (TYLENOL) 500 MG tablet Take 500 mg by mouth once as needed for headache.    . allopurinol (ZYLOPRIM) 300 MG tablet Take 300 mg by mouth every morning.     [name] ALPRAZolam (XANAX) 0.25 MG tablet Take 0.25 mg by mouth 3 (three) times daily as needed for anxiety.     [name] atorvastatin (LIPITOR) 80 MG tablet TAKE 1 TABLET BY MOUTH EVERY EVENING (Patient taking differently: TAKE 1/2 TABLET BY MOUTH EVERY EVENING) 30 tablet 5  . clopidogrel (PLAVIX) 75 MG tablet TAKE 1 TABLET BY MOUTH EVERY DAY 30 tablet 5  . esomeprazole (NEXIUM) 40 MG capsule Take 40 mg by mouth daily.     [name] FLUZONE HIGH-DOSE 0.5 ML SUSY Inject 1 Dose into the muscle once.  0  . metoprolol tartrate (LOPRESSOR) 25 MG tablet TAKE 1 TABLET BY MOUTH TWICE A DAY 60 tablet 3  . Multiple Vitamins-Minerals (SENIOR MULTIVITAMIN PLUS PO) Take 1 tablet by mouth every morning.     . nitroGLYCERIN (NITROSTAT) 0.4 MG SL tablet Place 1 tablet (0.4 mg total) under the tongue every 5 (five) minutes as needed for chest pain (up to 3 doses). 25 tablet 4   No current facility-administered medications for this visit.    Allergies as of 10/13/2015 - Review Complete 10/13/2015  Allergen Reaction Noted  . Aspirin Hives 07/13/2013  . Quinine derivatives Hives 07/13/2013    Vitals: BP 169/85 mmHg  Pulse 52  Ht 5' 9.5" (1.765 m)  Wt 170 lb 12.8 oz (77.474 kg)  BMI 24.87 kg/m2 Last Weight:  Wt Readings  from Last 1 Encounters:  10/13/15 170 lb 12.8 oz (77.474 kg)   Last Height:   Ht Readings from Last 1 Encounters:  10/13/15 5' 9.5" (1.765 m)    Physical exam: Exam: Gen: NAD, conversant                     CV: RRR, no MRG. No Carotid Bruits. No peripheral edema, warm, nontender Eyes: Conjunctivae clear without exudates or hemorrhage  Neuro: Detailed Neurologic Exam  Speech:    Speech is normal; fluent and spontaneous with normal comprehension.  Cognition:    The patient is oriented to person, place, and time;     recent  and remote memory intact;     language fluent;     normal attention, concentration,     fund of knowledge Cranial Nerves:    The pupils are equal, round, and reactive to light. The fundi are flat. Visual fields are full to finger confrontation. Extraocular movements are intact. Trigeminal sensation is intact and the muscles of mastication are normal. The face is symmetric. The palate elevates in the midline. Hearing impaired. Voice is normal. Shoulder shrug is normal. The tongue has normal motion without fasciculations.   Coordination:    Normal finger to nose and heel to shin.   Gait:    Not ataxic  Motor Observation:    No asymmetry, no atrophy, and no involuntary movements noted. Tone:    Normal muscle tone.    Posture:    Posture is normal. normal erect    Strength:Mild left leg hip flexion weakness. Otherwise strength is V/V in the upper and lower limbs.      Sensation: intact to LT     Reflex Exam:  DTR's:    Deep tendon reflexes in the upper and lower extremities are normal bilaterally.   Toes:    The toes are downgoing bilaterally.   Clonus:    Clonus is absent.      Assessment/Plan:  Mr. ATLAS SHIHADEH is a 75 y.o. male with history of CAD, NTEMI w/ DES, HLD presented to [facility] in early December 2016 with transient left sided numbness and weakness. He did not receive IV t-PA due to symptoms resolved.   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  ongoing risk factor control by Primary Care Physician    Hyperlipidemia  Continue lipitor 40  LDL 48, goal < 70  Advised to keep a BP diary at home and provide to pcp  Other Stroke Risk Factors  Advanced age  ETOH use  Coronary artery disease - STEMI w/ PCI, DES 2014  Other Active Problems  GERD  Anxiety  Hx L ankle fx w/ rod placement 2009  Snores: No sleep study necessary. Epworth sleepiness scale 4.  Cc:Dr Laurelyn Sickle, MD  [facility] [address] [place], [place] [address]  Phone [phone] Fax [phone]

## 2015-10-13 NOTE — Patient Outreach (Signed)
[facility]) Care Management  10/13/2015  TIZOC ORILEY 1941-04-23 [id number]  Telephone call to patient regarding EMMI stroke follow up. Unable to reach patient. HIPAA compliant voice message left with call back phone number.  PLAN: RNCM will attempt 2nd telephone outreach call to patient within 1 week.   Quinn Plowman RN,BSN,CCM [place] Coordinator [phone]

## 2015-10-13 NOTE — Patient Instructions (Signed)
Remember to drink plenty of fluid, eat healthy meals and do not skip any meals. Try to eat protein with a every meal and eat a healthy snack such as fruit or nuts in between meals. Try to keep a regular sleep-wake schedule and try to exercise daily, particularly in the form of walking, 20-30 minutes a day, if you can.   As far as your medications are concerned, I would like to suggest: continue current medications  As far as diagnostic testing: none  I would like to see you back as needed, sooner if we need to. Please call us with any interim questions, concerns, problems, updates or refill requests.   Our phone number is [phone]. We also have an after hours call service for urgent matters and there is a physician on-call for urgent questions. For any emergencies you know to call 911 or go to the nearest emergency room

## 2015-10-19 ENCOUNTER — Other Ambulatory Visit: Payer: Self-pay

## 2015-10-19 NOTE — Patient Outreach (Signed)
[facility]) Care Management  10/19/2015  Matthew Liu May 12, 1941 [id number]  Second telephone outreach to patient regarding EMMI stroke referral. Unable to reach patient.  HIPAA compliant voice message left with call back phone number.   PLAN: RNCM will attempt 3rd outreach call to patient within 1 week.  Quinn Plowman RN,BSN,CCM [place] Coordinator [phone]

## 2015-10-20 ENCOUNTER — Other Ambulatory Visit: Payer: Self-pay

## 2015-10-20 NOTE — Patient Outreach (Signed)
[facility]) Care Management  10/20/2015  MACALISTER SIRMAN 1941/03/13 [id number]  Third telephone outreach to patient regarding EMMI stroke referral. Unable to reach.  HIPPA compliant voice message left with call back phone number,.  PLAN: RNCM will send patient outreach letter to attempt contact.   Quinn Plowman RN,BSN,CCM [place] Coordinator [phone]

## 2015-11-16 ENCOUNTER — Other Ambulatory Visit: Payer: Self-pay

## 2015-11-16 NOTE — Patient Outreach (Signed)
[facility]) Care Management  11/16/2015  MARQUAVION ZENK 1941/01/20 [id number]  No response from patient after 3 phone calls and letter outreach attempt. '  PLAN: RNCM will refer patient to Josepha Pigg to close due to inability to re-establish contact with patient.   Quinn Plowman RN,BSN,CCM [facility] Telephonic  [phone]

## 2015-12-03 ENCOUNTER — Telehealth: Payer: Self-pay | Admitting: Interventional Cardiology

## 2015-12-03 NOTE — Telephone Encounter (Signed)
Returned call. Adv pt that I was returning his wife's call. Asked pt if assistance was needed. Pt sts that he does not know why his why called our office. lmtcb if assistance is still needed

## 2015-12-03 NOTE — Telephone Encounter (Signed)
New message    Pt wife is calling and she wants to speak to dr about her husband activities

## 2015-12-19 ENCOUNTER — Other Ambulatory Visit: Payer: Self-pay | Admitting: Interventional Cardiology

## 2015-12-22 NOTE — Telephone Encounter (Signed)
Should pt be on Lopressor 25 mg - 1 tab BID or 1/2 tab BID?  Last OV on 01/23/15 & Smith's Assessment & Plan states one tab & the Patient Instructions states 1/2 tab. The only refill since that OV was on 07/08/15 and the different doses were not addressed & it was refilled for 1 tab BID.   Essential hypertension: His blood pressure is elevated off beta blocker therapy. We will resume his full dose per blocker regimen as previous, 25 mg by mouth twice a day  Dyslipidemia  Current medicines are reviewed at length with the patient today. The patient has concerns regarding medicines.  The following changes have been made: Resume metoprolol   Labs/ tests ordered today include:  No orders of the defined types were placed in this encounter.  Disposition: FU with Linard Millers in 12 months  Signed, Sinclair Grooms, MD  01/23/2015 10:38 AM  [facility] [place], [place], [place] [address] Phone: [phone]; Fax: [phone]   ---------------------------------------------------------------------------------------------------------------------------------------------------------------------------------------------  AVS Reports     Date/Time Report Action User    01/23/2015 10:36 AM After Visit Summary Printed Barkley Boards, RN      Patient Instructions     Medication Instructions:  Your physician has recommended you make the following change in your medication:  1. RESUME Metoprolol Tartrate 25mg  take one-half tablet by mouth twice a day 2. Per Dr Tamala Julian you are okay to take Viagra

## 2016-01-05 ENCOUNTER — Encounter: Payer: Self-pay | Admitting: Interventional Cardiology

## 2016-01-19 ENCOUNTER — Other Ambulatory Visit: Payer: Self-pay | Admitting: Interventional Cardiology

## 2016-01-25 ENCOUNTER — Telehealth: Payer: Self-pay | Admitting: Interventional Cardiology

## 2016-01-25 NOTE — Telephone Encounter (Signed)
New message  Pt wife called states that while at the beach the pt was very tired and this was not like him. He didn't leave out of the room. Pt daughter request a call back to discuss if a sooner appt with Dr. Tamala Julian is needed. She also believes that he has lost some weight.

## 2016-01-25 NOTE — Telephone Encounter (Signed)
Returned pt wife call. She sts that they are on their way back from the beach. Pt wife reports a vague description of what going with the pt. The pt does not have a specific complaints. Denies chest pain, nausea, palpiatations, vomiting, swelling. She sts that the pt has not had an interest in doing the things he normally like to do. Usually when they are at the beach the pt likes to go off fishing, this times he has not. Pt has not wanted to do the things he normally does. When the pt is asked he sts that he "just does not feel like it". They are unable to provide any vital signs. Pt was recently seen by his pcp, no changes were made.   Adv her that since the pt is asymptomatic he should keep his upcoming appt Pt has an appt scheduled with Dr.Smith on 4/28. Adv pt wife to call the office when they return home to give an update of how the pt is doing, they should check pt vital signs and be able to report them.   Pt wife voiced appreciation for the call, and agreed with plan

## 2016-02-05 ENCOUNTER — Ambulatory Visit (INDEPENDENT_AMBULATORY_CARE_PROVIDER_SITE_OTHER): Payer: Medicare Other | Admitting: Interventional Cardiology

## 2016-02-05 ENCOUNTER — Encounter: Payer: Self-pay | Admitting: Interventional Cardiology

## 2016-02-05 VITALS — BP 152/80 | HR 58 | Ht 69.5 in | Wt 166.8 lb

## 2016-02-05 DIAGNOSIS — I10 Essential (primary) hypertension: Secondary | ICD-10-CM | POA: Diagnosis not present

## 2016-02-05 DIAGNOSIS — E785 Hyperlipidemia, unspecified: Secondary | ICD-10-CM

## 2016-02-05 DIAGNOSIS — I2510 Atherosclerotic heart disease of native coronary artery without angina pectoris: Secondary | ICD-10-CM

## 2016-02-05 DIAGNOSIS — N5201 Erectile dysfunction due to arterial insufficiency: Secondary | ICD-10-CM

## 2016-02-05 NOTE — Progress Notes (Signed)
Cardiology Office Note   Date:  02/05/2016   ID:  [name], DOB 04/03/1941, MRN [id number]  PCP:  Nicoletta Dress, MD  Cardiologist:  Sinclair Grooms, MD   Chief Complaint  Patient presents with  . Coronary Artery Disease      History of Present Illness: Matthew Liu is a 75 y.o. male who presents for CAD, hyperlipidemia, CVA, and hypertension. He had right coronary ostial and mid stenosis stented Inferior acute infarction in 2014. Complex disease in the mid to distal circumflex which by nuclear scintigraphy did not reveal any evidence of ischemia January 2015.  Wife concerned about drinking and not being physically active. She is concerned about no sex drive. He has no complaints about any of these issues and states that he will drink anywhere from 1-4 beers every day.[name]  He denies angina. He is concerned about whether or not the circumflex lesion could be causing him difficulty. His wife is displeased with his overall activity level.  Past Medical History  Diagnosis Date  . Hypertension   . CAD (coronary artery disease)     a. Inf STEMI 07/2013: s/p DES x 2 from ostial to [facility] 07/13/13. Residual Cx disease for med rx (PCI if recurrent pain).  . Aspirin allergy     a. Anaphylactic rxn.  [name] Dyslipidemia   . GERD (gastroesophageal reflux disease)   . Barrett's esophagus   . Hyperglycemia     a. A1C 5.8 in 07/2013.  [name] Myocardial infarction ([place])   . Anxiety   . ED (erectile dysfunction)   . Hydrocele of testis   . Ankle fracture, left 2009    rod placed    Past Surgical History  Procedure Laterality Date  . Left ankle surgery  1999  . Coronary angioplasty      stents   . Cataract surgery       bilateral   . Hydrocele excision Left 07/17/2014    Procedure: LEFT HYDROCELECTOMY;  Surgeon: Malka So, MD;  [facility]: [facility];  Service: Urology;  Laterality: Left;  . Left heart catheterization with coronary angiogram N/A 07/13/2013    Procedure: LEFT HEART  CATHETERIZATION WITH CORONARY ANGIOGRAM;  Surgeon: Sinclair Grooms, MD;  Location: [facility] CATH LAB;  Service: Cardiovascular;  Laterality: N/A;  . Percutaneous coronary stent intervention (pci-s)  07/13/2013    Procedure: PERCUTANEOUS CORONARY STENT INTERVENTION (PCI-S);  Surgeon: Sinclair Grooms, MD;  Location: [facility] CATH LAB;  Service: Cardiovascular;;     Current Outpatient Prescriptions  Medication Sig Dispense Refill  . acetaminophen (TYLENOL) 500 MG tablet Take 500 mg by mouth once as needed for headache.    . allopurinol (ZYLOPRIM) 300 MG tablet Take 300 mg by mouth every morning.     [name] ALPRAZolam (XANAX) 0.25 MG tablet Take 0.25 mg by mouth 3 (three) times daily as needed for anxiety.     [name] atorvastatin (LIPITOR) 80 MG tablet TAKE 1 TABLET BY MOUTH EVERY EVENING (Patient taking differently: TAKE 1/2 TABLET BY MOUTH EVERY EVENING) 30 tablet 5  . clopidogrel (PLAVIX) 75 MG tablet TAKE 1 TABLET BY MOUTH EVERY DAY 30 tablet 0  . FLUZONE HIGH-DOSE 0.5 ML SUSY Inject 1 Dose into the muscle once.  0  . metoprolol tartrate (LOPRESSOR) 25 MG tablet Take 0.5 tablets (12.5 mg total) by mouth 2 (two) times daily. 30 tablet 5  . Multiple Vitamins-Minerals (SENIOR MULTIVITAMIN PLUS PO) Take 1 tablet by mouth every morning.     [name]  nitroGLYCERIN (NITROSTAT) 0.4 MG SL tablet Place 1 tablet (0.4 mg total) under the tongue every 5 (five) minutes as needed for chest pain (up to 3 doses). 25 tablet 4  . pantoprazole (PROTONIX) 40 MG tablet Take 40 mg by mouth daily.    . quinapril (ACCUPRIL) 40 MG tablet Take 40 mg by mouth daily.  12   No current facility-administered medications for this visit.    Allergies:   Aspirin and Quinine derivatives    Social History:  The patient  reports that he has never smoked. He has never used smokeless tobacco. He reports that he drinks alcohol. He reports that he does not use illicit drugs.   Family History:  The patient's family history includes Hypertension in his  mother. There is no history of Stroke.    ROS:  Please see the history of present illness.   Otherwise, review of systems are positive for Reflux and difficulty swallowing.   All other systems are reviewed and negative.    PHYSICAL EXAM: VS:  BP 152/80 mmHg  Pulse 58  Ht 5' 9.5" (1.765 m)  Wt 166 lb 12.8 oz (75.66 kg)  BMI 24.29 kg/m2 , BMI Body mass index is 24.29 kg/(m^2). GEN: Well nourished, well developed, in no acute distress HEENT: normal Neck: no JVD, carotid bruits, or masses Cardiac: RRR.  There is no murmur, rub, or gallop. There is no edema. Respiratory:  clear to auscultation bilaterally, normal work of breathing. GI: soft, nontender, nondistended, + BS MS: no deformity or atrophy Skin: warm and dry, no rash Neuro:  Strength and sensation are intact Psych: euthymic mood, full affect   EKG:  EKG not ordered today.    Recent Labs: 09/14/2015: BUN 12; Creatinine, Ser 0.87; Hemoglobin 12.9*; Platelets 157; Potassium 4.1; Sodium 141    Lipid Panel    Component Value Date/Time   CHOL 112 09/15/2015 0205   TRIG 109 09/15/2015 0205   HDL 42 09/15/2015 0205   CHOLHDL 2.7 09/15/2015 0205   VLDL 22 09/15/2015 0205   LDLCALC 48 09/15/2015 0205      Wt Readings from Last 3 Encounters:  02/05/16 166 lb 12.8 oz (75.66 kg)  10/13/15 170 lb 12.8 oz (77.474 kg)  09/14/15 169 lb 5 oz (76.8 kg)      Other studies Reviewed: Additional studies/ records that were reviewed today include: Reviewed cath report. The findings include as follows.  1. Acute inferior myocardial infarction due to occlusion of the right coronary.  2. Successful PTCA and stenting of a totally occluded ostial stenosis to 0% with TIMI grade 3 flow. The proximal and mid vessel was also severely diseased requiring that a long segment of stent be placed (56 mm overlapping).  3. Aspirin allergy.  4. Severe circumflex obstructive disease, with a mid vessel [place], 0/1/1 bifurcation lesion.  5.  Moderate ostial LAD disease.  6. Inferior regional wall motion abnormality, but overall preserved LV function. LVEF 55%   ASSESSMENT AND PLAN:  1. CAD in native artery Double vessel coronary disease with previous stenting of the right coronary and high-grade obstruction circumflex not treated. Asymptomatic with reference to angina.  2. Essential hypertension Systolic hypertension. I've asked him to cut back on salt and decreased alcohol intake  3. Dyslipidemia Followed by primary care  4. Erectile dysfunction due to arterial insufficiency Uses Viagra without difficulty  5. Decreased ambition  Current medicines are reviewed at length with the patient today.  The patient has the following concerns regarding medicines:  None.  The following changes/actions have been instituted:    Stress nuclear scintigraphy to exclude progression of coronary disease  With aspirin allergy, the patient needs to continue clopidogrel. Circumflex was not treated at the same time as a right coronary because he wanted to be certain this single drug antiplatelet therapy did not facilitate stent thrombosis.  No specific idea why the patient has decreased ambition. I suggested that they speak with neurology to see if the recent stroke could've changed his personality.  Labs/ tests ordered today include:  No orders of the defined types were placed in this encounter.     Disposition:   FU with HS in 1 year  Signed, Sinclair Grooms, MD  02/05/2016 2:38 PM    [facility] [address], [place], [place]  [address] Phone: [phone]; Fax: [phone]

## 2016-02-05 NOTE — Patient Instructions (Signed)
Medication Instructions:   Your physician recommends that you continue on your current medications as directed. Please refer to the Current Medication list given to you today.   If you need a refill on your cardiac medications before your next appointment, please call your pharmacy.  Labwork:  NONE ORDER TODAY    Testing/Procedures:  Your physician has requested that you have en exercise stress myoview. For further information please visit [email or web address]. Please follow instruction sheet, as given.     Follow-Up:  Your physician wants you to follow-up in: [place] will receive a reminder letter in the mail two months in advance. If you don't receive a letter, please call our office to schedule the follow-up appointment.     Any Other Special Instructions Will Be Listed Below (If Applicable).

## 2016-02-10 ENCOUNTER — Telehealth: Payer: Self-pay | Admitting: Interventional Cardiology

## 2016-02-10 NOTE — Telephone Encounter (Signed)
Request for surgical clearance:  1. What type of surgery is being performed? EGD   2. When is this surgery scheduled? Not scheduled yet  3. Are there any medications that need to be held prior to surgery and how long?Can he hold Plavix for 3 days prior to surgery   4. Name of physician performing surgery? Dr Jackquline Denmark   5. What is your office phone and fax number? [phone] and Fax number is [phone]  6.

## 2016-02-16 NOTE — Telephone Encounter (Signed)
Cardiac clearance signed by Dr.Smith and placed in MR nurse fax box to be faxed to [facility] attn: Dr.Gupta fax # [phone]

## 2016-02-17 ENCOUNTER — Other Ambulatory Visit: Payer: Self-pay | Admitting: Interventional Cardiology

## 2016-02-23 ENCOUNTER — Telehealth (HOSPITAL_COMMUNITY): Payer: Self-pay | Admitting: *Deleted

## 2016-02-23 NOTE — Telephone Encounter (Signed)
Patient given detailed instructions per Myocardial Perfusion Study Information Sheet for the test on 02/25/16 at 1000. Patient notified to arrive 15 minutes early and that it is imperative to arrive on time for appointment to keep from having the test rescheduled.  If you need to cancel or reschedule your appointment, please call the office within 24 hours of your appointment. Failure to do so may result in a cancellation of your appointment, and a $50 no show fee. Patient verbalized understanding.Kareena Arrambide, Ranae [place]ms

## 2016-02-25 ENCOUNTER — Ambulatory Visit (HOSPITAL_COMMUNITY): Payer: Medicare Other | Attending: Cardiology

## 2016-02-25 DIAGNOSIS — I10 Essential (primary) hypertension: Secondary | ICD-10-CM | POA: Insufficient documentation

## 2016-02-25 DIAGNOSIS — R9439 Abnormal result of other cardiovascular function study: Secondary | ICD-10-CM | POA: Diagnosis not present

## 2016-02-25 DIAGNOSIS — Z8673 Personal history of transient ischemic attack (TIA), and cerebral infarction without residual deficits: Secondary | ICD-10-CM | POA: Insufficient documentation

## 2016-02-25 DIAGNOSIS — I2510 Atherosclerotic heart disease of native coronary artery without angina pectoris: Secondary | ICD-10-CM | POA: Diagnosis not present

## 2016-02-25 LAB — MYOCARDIAL PERFUSION IMAGING
Estimated workload: 7 METS
Exercise duration (min): 5 min
Exercise duration (sec): 0 s
LV dias vol: 126 mL (ref 62–150)
LV sys vol: 55 mL
MPHR: 145 {beats}/min
Peak HR: 139 {beats}/min
Percent HR: 96 %
RATE: 0.33
RPE: 18
Rest HR: 57 {beats}/min
SDS: 2
SRS: 5
SSS: 7
TID: 0.96

## 2016-02-25 MED ORDER — TECHNETIUM TC 99M TETROFOSMIN IV KIT
30.8000 | PACK | Freq: Once | INTRAVENOUS | Status: AC | PRN
  Administered 2016-02-25: 30.8 via INTRAVENOUS
  Filled 2016-02-25: qty 31

## 2016-02-25 MED ORDER — TECHNETIUM TC 99M TETROFOSMIN IV KIT
10.1000 | PACK | Freq: Once | INTRAVENOUS | Status: AC | PRN
  Administered 2016-02-25: 10.1 via INTRAVENOUS
  Filled 2016-02-25: qty 10

## 2016-04-04 HISTORY — PX: ESOPHAGOGASTRODUODENOSCOPY: SHX1529

## 2016-06-17 ENCOUNTER — Other Ambulatory Visit: Payer: Self-pay | Admitting: Interventional Cardiology

## 2016-06-21 ENCOUNTER — Emergency Department (HOSPITAL_COMMUNITY)
Admission: EM | Admit: 2016-06-21 | Discharge: 2016-06-21 | Disposition: A | Discharge status: Home / Self Care | Payer: Medicare Other | Attending: Emergency Medicine | Admitting: Emergency Medicine

## 2016-06-21 ENCOUNTER — Emergency Department (HOSPITAL_COMMUNITY): Payer: Medicare Other

## 2016-06-21 ENCOUNTER — Telehealth: Payer: Self-pay | Admitting: Interventional Cardiology

## 2016-06-21 ENCOUNTER — Other Ambulatory Visit: Payer: Self-pay

## 2016-06-21 ENCOUNTER — Encounter (HOSPITAL_COMMUNITY): Payer: Self-pay | Admitting: Emergency Medicine

## 2016-06-21 DIAGNOSIS — R791 Abnormal coagulation profile: Secondary | ICD-10-CM | POA: Insufficient documentation

## 2016-06-21 DIAGNOSIS — I2510 Atherosclerotic heart disease of native coronary artery without angina pectoris: Secondary | ICD-10-CM | POA: Insufficient documentation

## 2016-06-21 DIAGNOSIS — R202 Paresthesia of skin: Secondary | ICD-10-CM | POA: Diagnosis not present

## 2016-06-21 DIAGNOSIS — Z955 Presence of coronary angioplasty implant and graft: Secondary | ICD-10-CM | POA: Insufficient documentation

## 2016-06-21 DIAGNOSIS — Z8673 Personal history of transient ischemic attack (TIA), and cerebral infarction without residual deficits: Secondary | ICD-10-CM | POA: Diagnosis not present

## 2016-06-21 DIAGNOSIS — R2990 Unspecified symptoms and signs involving the nervous system: Secondary | ICD-10-CM

## 2016-06-21 DIAGNOSIS — I10 Essential (primary) hypertension: Secondary | ICD-10-CM | POA: Diagnosis not present

## 2016-06-21 DIAGNOSIS — I252 Old myocardial infarction: Secondary | ICD-10-CM | POA: Diagnosis not present

## 2016-06-21 LAB — I-STAT CHEM 8, ED
BUN: 10 mg/dL (ref 6–20)
CALCIUM ION: 1.14 mmol/L — AB (ref 1.15–1.40)
CHLORIDE: 99 mmol/L — AB (ref 101–111)
Creatinine, Ser: 0.8 mg/dL (ref 0.61–1.24)
Glucose, Bld: 130 mg/dL — ABNORMAL HIGH (ref 65–99)
HCT: 40 % (ref 39.0–52.0)
Hemoglobin: 13.6 g/dL (ref 13.0–17.0)
POTASSIUM: 4.1 mmol/L (ref 3.5–5.1)
SODIUM: 139 mmol/L (ref 135–145)
TCO2: 27 mmol/L (ref 0–100)

## 2016-06-21 LAB — DIFFERENTIAL
BASOS PCT: 0 %
Basophils Absolute: 0 10*3/uL (ref 0.0–0.1)
Eosinophils Absolute: 0.3 10*3/uL (ref 0.0–0.7)
Eosinophils Relative: 6 %
LYMPHS PCT: 26 %
Lymphs Abs: 1.4 10*3/uL (ref 0.7–4.0)
Monocytes Absolute: 0.5 10*3/uL (ref 0.1–1.0)
Monocytes Relative: 10 %
NEUTROS ABS: 3 10*3/uL (ref 1.7–7.7)
NEUTROS PCT: 58 %

## 2016-06-21 LAB — COMPREHENSIVE METABOLIC PANEL
ALBUMIN: 3.6 g/dL (ref 3.5–5.0)
ALK PHOS: 67 U/L (ref 38–126)
ALT: 19 U/L (ref 17–63)
ANION GAP: 7 (ref 5–15)
AST: 24 U/L (ref 15–41)
BUN: 9 mg/dL (ref 6–20)
CALCIUM: 8.7 mg/dL — AB (ref 8.9–10.3)
CHLORIDE: 104 mmol/L (ref 101–111)
CO2: 26 mmol/L (ref 22–32)
Creatinine, Ser: 0.88 mg/dL (ref 0.61–1.24)
GFR calc Af Amer: >60 mL/min (ref >60)
GFR calc non Af Amer: >60 mL/min (ref >60)
GLUCOSE: 138 mg/dL — AB (ref 65–99)
Potassium: 4.1 mmol/L (ref 3.5–5.1)
SODIUM: 137 mmol/L (ref 135–145)
Total Bilirubin: 0.8 mg/dL (ref 0.3–1.2)
Total Protein: 6.6 g/dL (ref 6.5–8.1)

## 2016-06-21 LAB — CBC
HCT: 39.6 % (ref 39.0–52.0)
Hemoglobin: 13.3 g/dL (ref 13.0–17.0)
MCH: 33.8 pg (ref 26.0–34.0)
MCHC: 33.6 g/dL (ref 30.0–36.0)
MCV: 100.8 fL — ABNORMAL HIGH (ref 78.0–100.0)
PLATELETS: 150 10*3/uL (ref 150–400)
RBC: 3.93 MIL/uL — AB (ref 4.22–5.81)
RDW: 13.2 % (ref 11.5–15.5)
WBC: 5.1 10*3/uL (ref 4.0–10.5)

## 2016-06-21 LAB — I-STAT TROPONIN, ED: Troponin i, poc: 0 ng/mL (ref 0.00–0.08)

## 2016-06-21 LAB — CBG MONITORING, ED: Glucose-Capillary: 157 mg/dL — ABNORMAL HIGH (ref 65–99)

## 2016-06-21 LAB — PROTIME-INR
INR: 1.14
PROTHROMBIN TIME: 14.6 s (ref 11.4–15.2)

## 2016-06-21 LAB — APTT: APTT: 30 s (ref 24–36)

## 2016-06-21 NOTE — ED Triage Notes (Signed)
Pt states he started having numbness in right lower foot and right hand at 1100 am this morning. No other symptoms. Neuro intact upon assessment. Denies HA. Pt has hx of stroke.

## 2016-06-21 NOTE — ED Notes (Signed)
MD has requested no code stroke be initiated at this time. Pt to triage.

## 2016-06-21 NOTE — Discharge Instructions (Signed)
Continue your Plavix.  Follow-up with your primary care physician.  Return to the emergency room with any new, worsening, or changing symptoms.

## 2016-06-21 NOTE — ED Provider Notes (Signed)
Patient discussed with Dr. Tamera Punt. Care assumed. MRI is normal. Patient is without symptoms. Appropriate for discharge. Continue Plavix. Primary care follow-up. ER with acute changes.   Tanna Furry, MD 06/21/16 1932

## 2016-06-21 NOTE — ED Notes (Signed)
Patient glucose reading is 157

## 2016-06-21 NOTE — Telephone Encounter (Signed)
New message    FYI    Pts wife is calling stating that her husband in having numbness in his right foot and she doesn't know what to do. She said she is going to take him to the hospital. Please call.

## 2016-06-21 NOTE — ED Notes (Signed)
Dr. Lacinda Axon talking to pt

## 2016-06-21 NOTE — ED Provider Notes (Signed)
[place] DEPT Provider Note   CSN: [id number] Arrival date & time: 06/21/16  1233     History   Chief Complaint Chief Complaint  Patient presents with  . Stroke Symptoms    HPI KAMAURY GIGLIOTTI is a 75 y.o. male.  Patient is a 75 year old male who presents with tingling of his right foot. He states he was getting out of his truck and when he stepped down he felt like his right foot with heavy anteriorly. There is no involvement of the rest of leg. No involvement of the arms. However she was driving to the emergency department he felt tingling in both of his fingertips. He denies any weakness to his extremities. No speech deficits. No vision changes. No headache. He denies any chest pain or shortness of breath. He had a recent stroke in December 2016 with left-sided weakness. His symptoms have improved and he is currently at baseline. He also has a history of significant coronary artery disease status post stent placement. He currently denies any cardiac symptoms.      Past Medical History:  Diagnosis Date  . Ankle fracture, left 2009   rod placed  . Anxiety   . Aspirin allergy    a. Anaphylactic rxn.  . Barrett's esophagus   . CAD (coronary artery disease)    a. Inf STEMI 07/2013: s/p DES x 2 from ostial to [facility] 07/13/13. Residual Cx disease for med rx (PCI if recurrent pain).  . Dyslipidemia   . ED (erectile dysfunction)   . GERD (gastroesophageal reflux disease)   . Hydrocele of testis   . Hyperglycemia    a. A1C 5.8 in 07/2013.  [name] Hypertension   . Myocardial infarction [facility])     Patient Active Problem List   Diagnosis Date Noted  . 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 Acute ischemic stroke ([place]) 09/15/2015  . Ankle fracture, left   . OSA (obstructive sleep apnea)   . TIA (transient ischemic attack) 09/14/2015  . Left sided numbness   . Left-sided weakness   . Erectile dysfunction 12/01/2014  . CAD in native artery  07/16/2013  . Aspirin allergy 07/16/2013  . Dyslipidemia 07/16/2013  . Hyperglycemia 07/16/2013  . 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 HTN (hypertension) 07/13/2013  . GERD (gastroesophageal reflux disease) 07/13/2013  . Barrett's esophagus 07/13/2013    Past Surgical History:  Procedure Laterality Date  . cataract surgery      bilateral   . CORONARY ANGIOPLASTY     stents   . HYDROCELE EXCISION Left 07/17/2014   Procedure: LEFT HYDROCELECTOMY;  Surgeon: Malka So, MD;  Location: [facility] ORS;  Service: Urology;  Laterality: Left;  . left ankle surgery  1999  . LEFT HEART CATHETERIZATION WITH CORONARY ANGIOGRAM N/A 07/13/2013   Procedure: LEFT HEART CATHETERIZATION WITH CORONARY ANGIOGRAM;  Surgeon: Sinclair Grooms, MD;  Location: [facility] CATH LAB;  Service: Cardiovascular;  Laterality: N/A;  . PERCUTANEOUS CORONARY STENT INTERVENTION (PCI-S)  07/13/2013   Procedure: PERCUTANEOUS CORONARY STENT INTERVENTION (PCI-S);  Surgeon: Sinclair Grooms, MD;  Location: [facility] C[facility] LAB;  Service: Cardiovascular;;       Home Medications    Prior to Admission medications   Medication Sig Start Date End Date Taking? Authorizing Provider  acetaminophen (TYLENOL) 500 MG tablet Take 500 mg by mouth daily as needed for headache.    Yes Historical Provider, MD  allopurinol (ZYLOPRIM) 300  MG tablet Take 300 mg by mouth every morning.  06/05/13  Yes Historical Provider, MD  ALPRAZolam (XANAX) 0.25 MG tablet Take 0.25 mg by mouth 3 (three) times daily as needed for anxiety.  06/29/13  Yes Historical Provider, MD  atorvastatin (LIPITOR) 80 MG tablet TAKE 1 TABLET BY MOUTH EVERY EVENING Patient taking differently: TAKE 1/2 TABLET BY MOUTH EVERY EVENING 07/23/14  Yes Belva Crome, MD  clopidogrel (PLAVIX) 75 MG tablet TAKE 1 TABLET BY MOUTH EVERY DAY 02/18/16  Yes Belva Crome, MD  metoprolol tartrate (LOPRESSOR) 25 MG tablet Take 0.5 tablets (12.5 mg total) by mouth 2 (two) times  daily. 06/17/16  Yes Belva Crome, MD  Multiple Vitamins-Minerals (SENIOR MULTIVITAMIN PLUS PO) Take 1 tablet by mouth every morning.    Yes Historical Provider, MD  nitroGLYCERIN (NITROSTAT) 0.4 MG SL tablet Place 1 tablet (0.4 mg total) under the tongue every 5 (five) minutes as needed for chest pain (up to 3 doses). 07/16/13  Yes Dayna N Dunn, PA-C  pantoprazole (PROTONIX) 20 MG tablet Take 20 mg by mouth daily.  05/26/16  Yes Historical Provider, MD  quinapril (ACCUPRIL) 40 MG tablet Take 40 mg by mouth daily. 01/12/16  Yes Historical Provider, MD    Family History Family History  Problem Relation Age of Onset  . Hypertension Mother   . Stroke Neg Hx     Social History Social History  Substance Use Topics  . Smoking status: Never Smoker  . Smokeless tobacco: Never Used  . Alcohol use Yes     Comment: occasional beer      Allergies   Aspirin and Quinine derivatives   Review of Systems Review of Systems  Constitutional: Negative for chills, diaphoresis, fatigue and fever.  HENT: Negative for congestion, rhinorrhea and sneezing.   Eyes: Negative.   Respiratory: Negative for cough, chest tightness and shortness of breath.   Cardiovascular: Negative for chest pain and leg swelling.  Gastrointestinal: Negative for abdominal pain, blood in stool, diarrhea, nausea and vomiting.  Genitourinary: Negative for difficulty urinating, flank pain, frequency and hematuria.  Musculoskeletal: Negative for arthralgias and back pain.  Skin: Negative for rash.  Neurological: Positive for numbness. Negative for dizziness, speech difficulty, weakness and headaches.     Physical Exam Updated Vital Signs BP 182/85   Pulse (!) 55   Temp 98.4 F (36.9 C)   Resp 20   Ht 5\' 9"  (1.753 m)   Wt 165 lb (74.8 kg)   SpO2 99%   BMI 24.37 kg/m   Physical Exam  Constitutional: He is oriented to person, place, and time. He appears well-developed and well-nourished.  HENT:  Head: Normocephalic and  atraumatic.  Eyes: Pupils are equal, round, and reactive to light.  Neck: Normal range of motion. Neck supple.  Cardiovascular: Normal rate, regular rhythm and normal heart sounds.   Pulmonary/Chest: Effort normal and breath sounds normal. No respiratory distress. He has no wheezes. He has no rales. He exhibits no tenderness.  Abdominal: Soft. Bowel sounds are normal. There is no tenderness. There is no rebound and no guarding.  Musculoskeletal: Normal range of motion. He exhibits no edema.  No swelling to the lower extremities. Pedal pulses are intact.  No pain along the lumbar spine or musculature  Lymphadenopathy:    He has no cervical adenopathy.  Neurological: He is alert and oriented to person, place, and time.  Motor 5 out of 5 all extremities, sensation grossly intact to light touch all extremities,  cranial nerves II through XII grossly intact, no pronator drift, finger to nose intact,  Skin: Skin is warm and dry. No rash noted.  Psychiatric: He has a normal mood and affect.     ED Treatments / Results  Labs (all labs ordered are listed, but only abnormal results are displayed) Labs Reviewed  CBC - Abnormal; Notable for the following:       Result Value   RBC 3.93 (*)    MCV 100.8 (*)    All other components within normal limits  COMPREHENSIVE METABOLIC PANEL - Abnormal; Notable for the following:    Glucose, Bld 138 (*)    Calcium 8.7 (*)    All other components within normal limits  CBG MONITORING, ED - Abnormal; Notable for the following:    Glucose-Capillary 157 (*)    All other components within normal limits  I-STAT CHEM 8, ED - Abnormal; Notable for the following:    Chloride 99 (*)    Glucose, Bld 130 (*)    Calcium, Ion 1.14 (*)    All other components within normal limits  PROTIME-INR  APTT  DIFFERENTIAL  I-STAT TROPOININ, ED  CBG MONITORING, ED    EKG  EKG Interpretation None       Radiology Ct Head Wo Contrast  Result Date:  06/21/2016 CLINICAL DATA:  Initial evaluation for acute numbness in left foot. EXAM: CT HEAD WITHOUT CONTRAST TECHNIQUE: Contiguous axial images were obtained from the base of the skull through the vertex without intravenous contrast. COMPARISON:  Prior MRI from 09/15/2015. FINDINGS: Brain: Diffuse prominence of the CSF containing spaces is compatible with generalized cerebral atrophy. Patchy and confluent hypodensity within the periventricular white matter most compatible chronic microvascular ischemic disease. Probable small remote lacunar infarcts within the bilateral basal ganglia. No evidence for acute large vessel territory infarct. No acute intracranial hemorrhage. No mass lesion, midline shift, or mass effect. No hydrocephalus. No extra-axial fluid collection. Vascular: No hyperdense vessel. Scattered vascular calcifications present within the carotid siphons and distal vertebral arteries. Skull: Scalp soft tissues demonstrate no acute abnormality. Calvarium intact. Sinuses/Orbits: No acute abnormality about the globes and orbits. Scattered mucosal thickening throughout the ethmoidal air cells, sphenoid sinuses, and maxillary sinuses. Small fluid levels within the partially visualized maxillary sinuses. No mastoid effusion. Other: No other significant finding. IMPRESSION: 1. No acute intracranial process. 2. Generalized age-related cerebral atrophy with mild chronic microvascular ischemic disease. 3. Inflammatory/allergic paranasal sinus disease as above. Electronically Signed   By: Jeannine Boga M.D.   On: 06/21/2016 14:16    Procedures Procedures (including critical care time)  Medications Ordered in ED Medications - No data to display   Initial Impression / Assessment and Plan / ED Course  I have reviewed the triage vital signs and the nursing notes.  Pertinent labs & imaging results that were available during my care of the patient were reviewed by me and considered in my medical  decision making (see chart for details).  Clinical Course   I spoke with Dr. Leonel Ramsay, Patient has vague symptoms of tingling in his right foot. He has no other neurologic deficits. The fact that he had tingling in both of his fingertips goes against this being a stroke. However, we will go ahead and get an MRI. If negative, he likely will be only be discharged home. He's currently asymptomatic. He doesn't have any symptoms that sound cardiac in nature. He doesn't have any back pain or radicular symptoms.  Pt awaiting MRI, if neg,  can d/c home.  Dr. Jeneen Rinks to follow.  Final Clinical Impressions(s) / ED Diagnoses   Final diagnoses:  None    New Prescriptions New Prescriptions   No medications on file     Malvin Johns, MD 06/21/16 [phone]

## 2016-06-21 NOTE — Telephone Encounter (Signed)
Patient stated he has numbness in his left foot. Patient's wife is driving patient to [facility] at this time. Informed patient that this is the best thing for him to do right now. Patient reported his BP at 155/88. Informed patient that he needs to be evaluated more to find out what is going on with his foot. Patient headed to ED.

## 2016-06-21 NOTE — ED Notes (Signed)
Pt off floor at MRI; neuro check to be done upon pts arrival back to room.

## 2016-06-21 NOTE — ED Notes (Signed)
Returned from CT.

## 2016-06-21 NOTE — ED Notes (Signed)
Patient transported to CT 

## 2016-07-25 ENCOUNTER — Ambulatory Visit: Payer: Medicare Other | Admitting: Neurology

## 2016-08-02 ENCOUNTER — Ambulatory Visit (INDEPENDENT_AMBULATORY_CARE_PROVIDER_SITE_OTHER): Payer: Medicare Other | Admitting: Neurology

## 2016-08-02 ENCOUNTER — Encounter: Payer: Self-pay | Admitting: Neurology

## 2016-08-02 VITALS — BP 154/76 | HR 68 | Ht 69.0 in | Wt 165.0 lb

## 2016-08-02 DIAGNOSIS — I639 Cerebral infarction, unspecified: Secondary | ICD-10-CM

## 2016-08-02 DIAGNOSIS — I6381 Other cerebral infarction due to occlusion or stenosis of small artery: Secondary | ICD-10-CM

## 2016-08-02 NOTE — Progress Notes (Signed)
[facility]    Provider:  Dr Jaynee Eagles Referring Provider: Nicoletta Dress, MD Primary Care Physician:  Nicoletta Dress, MD  CC:  right corona radiata infarct   Interval history: Patient was seen in the emergency room in September 2017 for right foot numbness. MRI of the brain was negative for acute process.He thought he was having a stroke. Numbness in the feet and tingling in the feet on the right. Symptoms went away. He had shingles across the small of his back, resolved no residual pain. Reviewed MRi of the brain images from September with patient.   HPI:  Matthew Liu is a 76 y.o. male here as a referral from Dr. [name] Bali for stroke. 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 He is feeling better. His arm is better. His left leg still feels uncoordinated. The numbness has improved as well. His left leg doesn't work like the right leg. He wants to run but they have advised him only to walk. His balance is good. He wants to move more to get better. He had home physical therapy. He is seeing a physical therapist at [facility]. Denies any weakness. His left leg just feels heavy. Walking helps with the feelings of heaviness in the left leg. No dysarthira or dysphagia, no aphasia, denies sensory changes. He is still on plavix, he is compliant with medications. He is on lipitor. He snores, he denies being tired during the day. He goes to bed around 11;30 and sleeps until 6:30am. No excessive daytime fatigue or morning headaches.   Reviewed notes, labs and imaging from outside physicians, which showed:  Personally reviewed all images and agree with the following:  Ct Head Wo Contrast 09/14/2015 No acute finding. Unremarkable head CT for age.   MRI HEAD  09/15/2015 1. 7 mm acute ischemic infarct within the right corona radiata. No associated hemorrhage or mass effect. 2.  Age-related cerebral atrophy with mild chronic small vessel ischemic disease.   MRA HEAD  09/15/2015 Negative intracranial MRA without large or proximal arterial branch occlusion. No high-grade or correctable stenosis.   2D Echocardiogram  - Left ventricle: The cavity size was mildly dilated. Wall thickness was normal. Systolic function was normal. The estimated ejection fraction was in the range of 55% to 60%. - Aortic valve: There was trivial regurgitation. - Mitral valve: There was mild regurgitation. - Left atrium: The atrium was mildly dilated. - Atrial septum: No defect or patent foramen ovale was identified.  Carotid Doppler  Bilateral ICA's 1-39%.  HgbA1c 5.8 LDL 48   Review of Systems: Patient complains of symptoms per HPI as well as the following symptoms: No CP, no SOB. Pertinent negatives per HPI. All others negative.    Social History   Social History  . Marital status: Married    Spouse name: Matthew Liu  . Number of children: 2  . Years of education: 12   Occupational History  . Department of Camera operator    Social History Main Topics  . Smoking status: Never Smoker  . Smokeless tobacco: Never Used  . Alcohol use Yes     Comment: occasional beer   . Drug use: No  . Sexual activity: Not Currently   Other Topics Concern  . Not on file   Social History Narrative   Lives with wife   Caffeine use: 1-2 cups coffee per day    Family History  Problem Relation Age of  Onset  . Hypertension Mother   . Stroke Neg Hx     Past Medical History:  Diagnosis Date  . Ankle fracture, left 2009   rod placed  . Anxiety   . Aspirin allergy    a. Anaphylactic rxn.  . Barrett's esophagus   . CAD (coronary artery disease)    a. Inf STEMI 07/2013: s/p DES x 2 from ostial to [facility] 07/13/13. Residual Cx disease for med rx (PCI if recurrent pain).  . Dyslipidemia   . ED (erectile dysfunction)   . GERD (gastroesophageal reflux disease)   .  Hydrocele of testis   . Hyperglycemia    a. A1C 5.8 in 07/2013.  [name] Hypertension   . Myocardial infarction     Past Surgical History:  Procedure Laterality Date  . cataract surgery      bilateral   . CORONARY ANGIOPLASTY     stents   . HYDROCELE EXCISION Left 07/17/2014   Procedure: LEFT HYDROCELECTOMY;  Surgeon: Malka So, MD;  Location: [facility] ORS;  Service: Urology;  Laterality: Left;  . left ankle surgery  1999  . LEFT HEART CATHETERIZATION WITH CORONARY ANGIOGRAM N/A 07/13/2013   Procedure: LEFT HEART CATHETERIZATION WITH CORONARY ANGIOGRAM;  Surgeon: Sinclair Grooms, MD;  Location: [facility] CATH LAB;  Service: Cardiovascular;  Laterality: N/A;  . PERCUTANEOUS CORONARY STENT INTERVENTION (PCI-S)  07/13/2013   Procedure: PERCUTANEOUS CORONARY STENT INTERVENTION (PCI-S);  Surgeon: Sinclair Grooms, MD;  Location: [facility] CATH LAB;  Service: Cardiovascular;;    Current Outpatient Prescriptions  Medication Sig Dispense Refill  . acetaminophen (TYLENOL) 500 MG tablet Take 500 mg by mouth daily as needed for headache.     . allopurinol (ZYLOPRIM) 300 MG tablet Take 300 mg by mouth every morning.     [name] ALPRAZolam (XANAX) 0.25 MG tablet Take 0.25 mg by mouth 3 (three) times daily as needed for anxiety.     [name] atorvastatin (LIPITOR) 80 MG tablet TAKE 1 TABLET BY MOUTH EVERY EVENING (Patient taking differently: TAKE 1/2 TABLET BY MOUTH EVERY EVENING) 30 tablet 5  . clopidogrel (PLAVIX) 75 MG tablet TAKE 1 TABLET BY MOUTH EVERY DAY 30 tablet 11  . metoprolol tartrate (LOPRESSOR) 25 MG tablet Take 0.5 tablets (12.5 mg total) by mouth 2 (two) times daily. 30 tablet 6  . Multiple Vitamins-Minerals (SENIOR MULTIVITAMIN PLUS PO) Take 1 tablet by mouth every morning.     . nitroGLYCERIN (NITROSTAT) 0.4 MG SL tablet Place 1 tablet (0.4 mg total) under the tongue every 5 (five) minutes as needed for chest pain (up to 3 doses). 25 tablet 4  . pantoprazole (PROTONIX) 20 MG tablet Take 20 mg by mouth daily.   1  .  quinapril (ACCUPRIL) 40 MG tablet Take 40 mg by mouth daily.  12   No current facility-administered medications for this visit.     Allergies as of 08/02/2016 - Review Complete 08/02/2016  Allergen Reaction Noted  . Aspirin Hives and Shortness Of Breath 07/13/2013  . Quinine derivatives Hives 07/13/2013    Vitals: BP (!) 154/76 (BP Location: Left Arm, Patient Position: Sitting, Cuff Size: Normal)   Pulse 68   Ht 5\' 9"  (1.753 m)   Wt 165 lb (74.8 kg)   BMI 24.37 kg/m  Last Weight:  Wt Readings from Last 1 Encounters:  08/02/16 165 lb (74.8 kg)   Last Height:   Ht Readings from Last 1 Encounters:  08/02/16 5\' 9"  (1.753 m)   Physical exam: Exam:  Gen: NAD, conversant                     CV: RRR, no MRG. No Carotid Bruits. No peripheral edema, warm, nontender Eyes: Conjunctivae clear without exudates or hemorrhage  Neuro: Detailed Neurologic Exam  Speech:    Speech is normal; fluent and spontaneous with normal comprehension.  Cognition:    The patient is oriented to person, place, and time;     recent and remote memory intact;     language fluent;     normal attention, concentration,     fund of knowledge Cranial Nerves:    The pupils are equal, round, and reactive to light. The fundi are flat. Visual fields are full to finger confrontation. Extraocular movements are intact. Trigeminal sensation is intact and the muscles of mastication are normal. The face is symmetric. The palate elevates in the midline. Hearing impaired. Voice is normal. Shoulder shrug is normal. The tongue has normal motion without fasciculations.   Coordination:    Normal finger to nose and heel to shin.   Gait:    Not ataxic  Motor Observation:    No asymmetry, no atrophy, and no involuntary movements noted. Tone:    Normal muscle tone.    Posture:    Posture is normal. normal erect    Strength:Mild left leg hip flexion weakness. Otherwise strength is V/V in the upper and lower limbs.       Sensation: intact to LT     Reflex Exam:  DTR's:    Deep tendon reflexes in the upper and lower extremities are normal bilaterally.   Toes:    The toes are downgoing bilaterally.   Clonus:    Clonus is absent.      Assessment/Plan:  Mr. AYEDIN WINFREE is a 75 y.o. male with history of CAD, NTEMI w/ DES, HLD presented to [facility] in early December 2016 with transient left sided numbness and weakness. He did not receive IV t-PA due to symptoms resolved.   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  ongoing risk factor control by Primary Care Physician including cholesterol, HTN, glucose.   I had a long d/w patient about her recent stroke, risk for recurrent stroke/TIAs, personally independently reviewed imaging studies and stroke evaluation results and answered questions.Continue Plavix for secondary stroke prevention and maintain strict control of hypertension with blood pressure goal below 130/90, diabetes with hemoglobin A1c goal below 6.5% and lipids with LDL cholesterol goal below 70 mg/dL. I also advised the patient to eat a healthy diet with plenty of whole grains, cereals, fruits and vegetables, exercise regularly and maintain ideal body weight .Followup with primary care in the future.   Cc:Dr Laurelyn Sickle, MD  [facility] [address] [place], [place] [address]  Phone [phone] Fax [phone]  A total of 25 minutes was spent face-to-face with this patient. Over half this time was spent on counseling patient on the small-vessel stroke  diagnosis and different diagnostic and therapeutic options available.

## 2016-08-02 NOTE — Patient Instructions (Signed)
Remember to drink plenty of fluid, eat healthy meals and do not skip any meals. Try to eat protein with a every meal and eat a healthy snack such as fruit or nuts in between meals. Try to keep a regular sleep-wake schedule and try to exercise daily, particularly in the form of walking, 20-30 minutes a day, if you can.   As far as your medications are concerned, I would like to suggest: Continue current medications  I would like to see you back in as needed, sooner if we need to. Please call us with any interim questions, concerns, problems, updates or refill requests.   Our phone number is [phone]. We also have an after hours call service for urgent matters and there is a physician on-call for urgent questions. For any emergencies you know to call 911 or go to the nearest emergency room

## 2016-09-20 ENCOUNTER — Other Ambulatory Visit: Payer: Self-pay

## 2016-09-20 NOTE — Patient Outreach (Signed)
Encounter opened in error. Disregard.   Matthew Liu

## 2016-12-13 ENCOUNTER — Telehealth: Payer: Self-pay | Admitting: Interventional Cardiology

## 2016-12-13 NOTE — Telephone Encounter (Signed)
Pt walked in today.  He wanted to let Dr. Tamala Julian know that he called EMS one day last week, couldn't remember which day.  He called because he felt like his heart was fluttering and had some numbness in his left arm.  Pt had been outside and more active than usual that day.  Pt states EMS did an EKG and told him it was fine.  Pt couldn't remember vitals.  Pt said EMS told him that everything was looking good but they could transfer him to hospital if he wanted to.  Pt declined because he was feeling better.  Has not had any problems since and is feeling great.  Advised pt if this happens again to call our office or report to ED for evaluation.  Pt agreeable with plan.  Has annual f/u in May with Dr. Tamala Julian.  Will route to Dr. Tamala Julian to see if he has any further recommendations.

## 2017-01-03 ENCOUNTER — Other Ambulatory Visit: Payer: Self-pay | Admitting: Interventional Cardiology

## 2017-01-29 ENCOUNTER — Other Ambulatory Visit: Payer: Self-pay | Admitting: Interventional Cardiology

## 2017-02-13 ENCOUNTER — Other Ambulatory Visit: Payer: Self-pay | Admitting: Interventional Cardiology

## 2017-02-25 NOTE — Progress Notes (Signed)
Cardiology Office Note    Date:  02/27/2017   ID:  Matthew Liu, Matthew Liu May 24, 1941, MRN [id number]  PCP:  Nicoletta Dress, MD  Cardiologist: Sinclair Grooms, MD   Chief Complaint  Patient presents with  . Coronary Artery Disease    History of Present Illness:  Matthew Liu is a 76 y.o. male who presents for CAD With history of right coronary ostial and mid vessel stenting during acute inferior infarction 2014, residual complex mid and distal circumflex disease not demonstrated to cause ischemia by scintigraphy in 2015, hyperlipidemia, CVA, and hypertension.   He is doing well. He voices no complaints. Ischemic symptom was bilateral elbow and arm discomfort. He has had none similar set stenting the right coronary in 2014. He denies claudication. States that his wife prevents some activity that he will like to pursue, because she feels he is losing weight. Specifically, he denies anorexia. He is not a meat eater. He gets hungry and food tastes normal.  Past Medical History:  Diagnosis Date  . Ankle fracture, left 2009   rod placed  . Anxiety   . Aspirin allergy    a. Anaphylactic rxn.  . Barrett's esophagus   . CAD (coronary artery disease)    a. Inf STEMI 07/2013: s/p DES x 2 from ostial to [facility] 07/13/13. Residual Cx disease for med rx (PCI if recurrent pain).  . Dyslipidemia   . ED (erectile dysfunction)   . GERD (gastroesophageal reflux disease)   . Hydrocele of testis   . Hyperglycemia    a. A1C 5.8 in 07/2013.  [name] Hypertension   . Myocardial infarction [facility])     Past Surgical History:  Procedure Laterality Date  . cataract surgery      bilateral   . CORONARY ANGIOPLASTY     stents   . HYDROCELE EXCISION Left 07/17/2014   Procedure: LEFT HYDROCELECTOMY;  Surgeon: Malka So, MD;  Location: [facility] ORS;  Service: Urology;  Laterality: Left;  . left ankle surgery  1999  . LEFT HEART CATHETERIZATION WITH CORONARY ANGIOGRAM N/A 07/13/2013   Procedure: LEFT HEART  CATHETERIZATION WITH CORONARY ANGIOGRAM;  Surgeon: Sinclair Grooms, MD;  Location: [facility] CATH LAB;  Service: Cardiovascular;  Laterality: N/A;  . PERCUTANEOUS CORONARY STENT INTERVENTION (PCI-S)  07/13/2013   Procedure: PERCUTANEOUS CORONARY STENT INTERVENTION (PCI-S);  Surgeon: Sinclair Grooms, MD;  Location: [facility] CATH LAB;  Service: Cardiovascular;;    Current Medications: Outpatient Medications Prior to Visit  Medication Sig Dispense Refill  . acetaminophen (TYLENOL) 500 MG tablet Take 500 mg by mouth daily as needed for headache.     . ALPRAZolam (XANAX) 0.25 MG tablet Take 0.25 mg by mouth 3 (three) times daily as needed for anxiety.     . clopidogrel (PLAVIX) 75 MG tablet TAKE 1 TABLET BY MOUTH EVERY DAY 30 tablet 0  . metoprolol tartrate (LOPRESSOR) 25 MG tablet TAKE 1/2 TABLETS BY MOUTH 2 TIMES DAILY. 30 tablet 0  . Multiple Vitamins-Minerals (SENIOR MULTIVITAMIN PLUS PO) Take 1 tablet by mouth every morning.     . nitroGLYCERIN (NITROSTAT) 0.4 MG SL tablet Place 1 tablet (0.4 mg total) under the tongue every 5 (five) minutes as needed for chest pain (up to 3 doses). 25 tablet 4  . pantoprazole (PROTONIX) 20 MG tablet Take 20 mg by mouth daily.   1  . quinapril (ACCUPRIL) 40 MG tablet Take 40 mg by mouth daily.  12  . allopurinol (ZYLOPRIM)  300 MG tablet Take 300 mg by mouth every morning.     [name] atorvastatin (LIPITOR) 80 MG tablet TAKE 1 TABLET BY MOUTH EVERY EVENING (Patient not taking: Reported on 02/27/2017) 30 tablet 5   No facility-administered medications prior to visit.      Allergies:   Aspirin and Quinine derivatives   Social History   Social History  . Marital status: Married    Spouse name: [name]  . Number of children: 2  . Years of education: 12   Occupational History  . Department of Camera operator    Social History Main Topics  . Smoking status: Never Smoker  . Smokeless tobacco: Never Used  . Alcohol use Yes     Comment: occasional beer   . Drug  use: No  . Sexual activity: Not Currently   Other Topics Concern  . None   Social History Narrative   Lives with wife   Caffeine use: 1-2 cups coffee per day     Family History:  The patient's family history includes Hypertension in his mother.   ROS:   Please see the history of present illness.    No new symptoms to suggest stroke. Denies syncope. Denies elbow pain on exertion. Advocate's compliance with medical therapy. Amlodipine was associated with bilateral lower extremity swelling in the past. All other systems reviewed and are negative.   PHYSICAL EXAM:   VS:  BP (!) 164/86 (BP Location: Left Arm)   Pulse 60   Ht 5' 8.5" (1.74 m)   Wt 167 lb 12.8 oz (76.1 kg)   BMI 25.14 kg/m    GEN: Well nourished, well developed, in no acute distress  HEENT: normal  Neck: no JVD, carotid bruits, or masses Cardiac: RRR; no murmurs, rubs, or gallops,no edema  Respiratory:  clear to auscultation bilaterally, normal work of breathing GI: soft, nontender, nondistended, + BS MS: no deformity or atrophy  Skin: warm and dry, no rash Neuro:  Alert and Oriented x 3, Strength and sensation are intact Psych: euthymic mood, full affect  Wt Readings from Last 3 Encounters:  02/27/17 167 lb 12.8 oz (76.1 kg)  08/02/16 165 lb (74.8 kg)  06/21/16 165 lb (74.8 kg)      Studies/Labs Reviewed:   EKG:  EKG  Not repeated. Last tracing performed in September 2017 demonstrated normal sinus rhythm with poor R-wave progression. No acute change.  Recent Labs: 06/21/2016: ALT 19; BUN 10; Creatinine, Ser 0.80; Hemoglobin 13.6; Platelets 150; Potassium 4.1; Sodium 139   Lipid Panel    Component Value Date/Time   CHOL 112 09/15/2015 0205   TRIG 109 09/15/2015 0205   HDL 42 09/15/2015 0205   CHOLHDL 2.7 09/15/2015 0205   VLDL 22 09/15/2015 0205   LDLCALC 48 09/15/2015 0205    Additional studies/ records that were reviewed today include:  No recent lipid panel. He is unable to verify that blood  work is recently been done by his primary care.    ASSESSMENT:    1. CAD in native artery   2. Essential hypertension   3. Dyslipidemia   4. Old MI (myocardial infarction)   5. Small vessel disease   6. OSA (obstructive sleep apnea)      PLAN:  In order of problems listed above:  1. Asymptomatic without recurrent anginal symptoms which weren't noted to be bilateral elbow and arm pain. 2. Elevated systolic pressures, much greater than are target of 140/80 mmHg. Add hydrochlorothiazide 12.5 mg Monday, Wednesday, and Friday.  Hypertension clinic in 3 weeks. Complete metabolic panel and lipid panel on return. 3. Lipid panel in 3 weeks, fasting. 4. No evidence of volume overload LV dysfunction. 5. Some decrease in memory. 6. Compliant with continuous positive airway pressure.  Hypertension clinic in 3 weeks. Blood work at that time. HCTZ may need to be increased to daily depending upon blood pressure. He and his wife were recorded some values to bring to the next visit.  Medication Adjustments/Labs and Tests Ordered: Current medicines are reviewed at length with the patient today.  Concerns regarding medicines are outlined above.  Medication changes, Labs and Tests ordered today are listed in the Patient Instructions below. Patient Instructions  Medication Instructions:  1) START Hydrochlorothiazide 12.5mg  once daily on Monday, Wednesday and Friday.  Labwork: CMET and Lipids at time of Hypertension Clinic appointment.   Testing/Procedures: None  Follow-Up: Your physician recommends that you schedule a follow-up appointment in: 3 weeks with our Hypertension Clinic.  Your physician wants you to follow-up in: 1 year with Dr. Tamala Julian. You will receive a reminder letter in the mail two months in advance. If you don't receive a letter, please call our office to schedule the follow-up appointment.    Any Other Special Instructions Will Be Listed Below (If Applicable).  Our goal is  for your blood pressure to be 140/90 or less.    If you need a refill on your cardiac medications before your next appointment, please call your pharmacy.      Signed, Sinclair Grooms, MD  02/27/2017 11:51 AM    [facility] [place], [place],   [address] Phone: [phone]; Fax: [phone]

## 2017-02-27 ENCOUNTER — Encounter: Payer: Self-pay | Admitting: Interventional Cardiology

## 2017-02-27 ENCOUNTER — Ambulatory Visit (INDEPENDENT_AMBULATORY_CARE_PROVIDER_SITE_OTHER): Payer: Medicare Other | Admitting: Interventional Cardiology

## 2017-02-27 VITALS — BP 164/86 | HR 60 | Ht 68.5 in | Wt 167.8 lb

## 2017-02-27 DIAGNOSIS — I999 Unspecified disorder of circulatory system: Secondary | ICD-10-CM

## 2017-02-27 DIAGNOSIS — I252 Old myocardial infarction: Secondary | ICD-10-CM | POA: Diagnosis not present

## 2017-02-27 DIAGNOSIS — I10 Essential (primary) hypertension: Secondary | ICD-10-CM

## 2017-02-27 DIAGNOSIS — I739 Peripheral vascular disease, unspecified: Secondary | ICD-10-CM

## 2017-02-27 DIAGNOSIS — G4733 Obstructive sleep apnea (adult) (pediatric): Secondary | ICD-10-CM

## 2017-02-27 DIAGNOSIS — I2510 Atherosclerotic heart disease of native coronary artery without angina pectoris: Secondary | ICD-10-CM | POA: Diagnosis not present

## 2017-02-27 DIAGNOSIS — E785 Hyperlipidemia, unspecified: Secondary | ICD-10-CM | POA: Diagnosis not present

## 2017-02-27 MED ORDER — HYDROCHLOROTHIAZIDE 12.5 MG PO CAPS
12.5000 mg | ORAL_CAPSULE | ORAL | 3 refills | Status: DC
Start: 2017-02-27 — End: 2017-03-20

## 2017-02-27 NOTE — Patient Instructions (Addendum)
Medication Instructions:  1) START Hydrochlorothiazide 12.5mg  once daily on Monday, Wednesday and Friday.  Labwork: CMET and Lipids at time of Hypertension Clinic appointment.   Testing/Procedures: None  Follow-Up: Your physician recommends that you schedule a follow-up appointment in: 3 weeks with our Hypertension Clinic.  Your physician wants you to follow-up in: 1 year with Dr. Tamala Julian. You will receive a reminder letter in the mail two months in advance. If you don't receive a letter, please call our office to schedule the follow-up appointment.    Any Other Special Instructions Will Be Listed Below (If Applicable).  Our goal is for your blood pressure to be 140/90 or less.    If you need a refill on your cardiac medications before your next appointment, please call your pharmacy.

## 2017-02-27 NOTE — Addendum Note (Signed)
Addended by: Loren Racer on: 02/27/2017 11:58 AM   Modules accepted: Orders

## 2017-03-20 ENCOUNTER — Other Ambulatory Visit: Payer: Medicare Other | Admitting: *Deleted

## 2017-03-20 ENCOUNTER — Ambulatory Visit (INDEPENDENT_AMBULATORY_CARE_PROVIDER_SITE_OTHER): Payer: Medicare Other | Admitting: Pharmacist

## 2017-03-20 VITALS — BP 156/80 | HR 56

## 2017-03-20 DIAGNOSIS — I10 Essential (primary) hypertension: Secondary | ICD-10-CM

## 2017-03-20 MED ORDER — HYDROCHLOROTHIAZIDE 12.5 MG PO CAPS: 12.5000 mg | ORAL_CAPSULE | Freq: Every day | ORAL | 11 refills | Status: DC

## 2017-03-20 NOTE — Progress Notes (Signed)
Patient ID: Matthew Liu                 DOB: [birth year]                      MRN: [id number]     HPI: Matthew Liu is a 76 y.o. male referred by Dr. Tamala Julian to HTN clinic. PMH is significant for CAD s/p MI in 2014, CVA, HLD, and HTN. At last visit 3 weeks ago, BP was elevated to 164/86 and pt was started on HCTZ 12.5mg  on MWF. Pt presents today for BP follow up and BMET.  Pt reports feeling well overall. Denies dizziness, blurred vison, falls, or headache. He has been checking his BP at home using a bicep cuff that he has had for 5-6 years. He states his wife brought his cuff in to his PCP office and his readings matched well with their clinic cuff. Home readings range 111/61 to 141/89, with most readings in the upper 120s and 130s.   Current HTN meds: metoprolol 12.5mg  BID, quinapril 40mg  daily, HCTZ 12.5mg  MWF Previously tried: amlodipine - LEE BP goal: <130/95mmHg  Family History: HTN in his mother  Social History: Denies tobacco use, drinks beer occasionally, denies illicit drug use.  Diet: Does not eat a lot of meat. Does not add salt to his food. Does not eat much sugar. Likes oatmeal for breakfast. Likes vegetables, occasionally has a burger. Has a cup of coffee and water in the morning. States his wife cooks a lot of food and he usually tries to eat more than he wants to to keep his wife happy.  Exercise: Yard work and walks a few miles a few days a week   Home BP readings: on HCTZ days: 132/80, 126/77, 140/78, 125/74. On non-HCTZ days: 111/61, 134/84, 133/84.  Wt Readings from Last 3 Encounters:  02/27/17 167 lb 12.8 oz (76.1 kg)  08/02/16 165 lb (74.8 kg)  06/21/16 165 lb (74.8 kg)   BP Readings from Last 3 Encounters:  02/27/17 (!) 164/86  08/02/16 (!) 154/76  06/21/16 166/77   Pulse Readings from Last 3 Encounters:  02/27/17 60  08/02/16 68  06/21/16 60    Renal function: CrCl cannot be calculated (Patient's most recent lab result is older than the maximum 21 days  allowed.).  Past Medical History:  Diagnosis Date  . Ankle fracture, left 2009   rod placed  . Anxiety   . Aspirin allergy    a. Anaphylactic rxn.  . Barrett's esophagus   . CAD (coronary artery disease)    a. Inf STEMI 07/2013: s/p DES x 2 from ostial to [facility] 07/13/13. Residual Cx disease for med rx (PCI if recurrent pain).  . Dyslipidemia   . ED (erectile dysfunction)   . GERD (gastroesophageal reflux disease)   . Hydrocele of testis   . Hyperglycemia    a. A1C 5.8 in 07/2013.  [name] Hypertension   . Myocardial infarction [facility])     Current Outpatient Prescriptions on File Prior to Visit  Medication Sig Dispense Refill  . acetaminophen (TYLENOL) 500 MG tablet Take 500 mg by mouth daily as needed for headache.     . ALPRAZolam (XANAX) 0.25 MG tablet Take 0.25 mg by mouth 3 (three) times daily as needed for anxiety.     [name] atorvastatin (LIPITOR) 80 MG tablet Take 40 mg by mouth daily.  5  . clopidogrel (PLAVIX) 75 MG tablet TAKE 1 TABLET BY MOUTH EVERY  DAY 30 tablet 0  . hydrochlorothiazide (MICROZIDE) 12.5 MG capsule Take 1 capsule (12.5 mg total) by mouth every Monday, Wednesday, and Friday. 45 capsule 3  . metoprolol tartrate (LOPRESSOR) 25 MG tablet TAKE 1/2 TABLETS BY MOUTH 2 TIMES DAILY. 30 tablet 0  . Multiple Vitamins-Minerals (SENIOR MULTIVITAMIN PLUS PO) Take 1 tablet by mouth every morning.     . nitroGLYCERIN (NITROSTAT) 0.4 MG SL tablet Place 1 tablet (0.4 mg total) under the tongue every 5 (five) minutes as needed for chest pain (up to 3 doses). 25 tablet 4  . pantoprazole (PROTONIX) 20 MG tablet Take 20 mg by mouth daily.   1  . quinapril (ACCUPRIL) 40 MG tablet Take 40 mg by mouth daily.  12   No current facility-administered medications on file prior to visit.     Allergies  Allergen Reactions  . Aspirin Hives and Shortness Of Breath    "Shortness of Breath" can't breathe  . Quinine Derivatives Hives    "looks like he has the measles"      Assessment/Plan:  1. Hypertension - BP remains above goal <130/64mmHg, although clinic reading is notably higher than all reported home readings. Will increase frequency of HCTZ to 12.5mg  daily and continue quinapril and metoprolol.  Advised pt to continue to check his BP at home and to bring his readings and BP cuff to next visit for cuff calibration. BMET check today since starting HCTZ. Will follow up in clinic in 3 weeks.   Daniyla Pfahler E. Supple, PharmD, CPP, [place] [address] [place], [place] [address] Phone: [phone]; Fax: [phone] 03/20/2017 1:58 PM

## 2017-03-20 NOTE — Patient Instructions (Signed)
It was nice to meet you today  Increase your hydrochlorothiazide (HCTZ) to 12.5mg  every day  Continue taking your other medications  Continue to check your blood pressure at home and record your readings  Follow up in clinic in 3 weeks. Please bring your blood pressure cuff to this visit

## 2017-03-21 LAB — COMPREHENSIVE METABOLIC PANEL
A/G RATIO: 1.9 (ref 1.2–2.2)
ALT: 21 IU/L (ref 0–44)
AST: 27 IU/L (ref 0–40)
Albumin: 4.3 g/dL (ref 3.5–4.8)
Alkaline Phosphatase: 74 IU/L (ref 39–117)
BILIRUBIN TOTAL: 1.1 mg/dL (ref 0.0–1.2)
BUN/Creatinine Ratio: 17 (ref 10–24)
BUN: 15 mg/dL (ref 8–27)
CALCIUM: 8.9 mg/dL (ref 8.6–10.2)
CHLORIDE: 104 mmol/L (ref 96–106)
CO2: 23 mmol/L (ref 20–29)
Creatinine, Ser: 0.86 mg/dL (ref 0.76–1.27)
GFR calc Af Amer: 97 mL/min/{1.73_m2} (ref >59)
GFR, EST NON AFRICAN AMERICAN: 84 mL/min/{1.73_m2} (ref >59)
Globulin, Total: 2.3 g/dL (ref 1.5–4.5)
Glucose: 103 mg/dL — ABNORMAL HIGH (ref 65–99)
POTASSIUM: 4.1 mmol/L (ref 3.5–5.2)
Sodium: 141 mmol/L (ref 134–144)
Total Protein: 6.6 g/dL (ref 6.0–8.5)

## 2017-03-21 LAB — LIPID PANEL
Chol/HDL Ratio: 2.4 ratio (ref 0.0–5.0)
Cholesterol, Total: 108 mg/dL (ref 100–199)
HDL: 45 mg/dL (ref >39)
LDL Calculated: 46 mg/dL (ref 0–99)
TRIGLYCERIDES: 87 mg/dL (ref 0–149)
VLDL Cholesterol Cal: 17 mg/dL (ref 5–40)

## 2017-03-24 ENCOUNTER — Other Ambulatory Visit: Payer: Self-pay | Admitting: Interventional Cardiology

## 2017-04-10 ENCOUNTER — Ambulatory Visit (INDEPENDENT_AMBULATORY_CARE_PROVIDER_SITE_OTHER): Payer: Medicare Other | Admitting: Pharmacist

## 2017-04-10 ENCOUNTER — Encounter (INDEPENDENT_AMBULATORY_CARE_PROVIDER_SITE_OTHER): Payer: Self-pay

## 2017-04-10 VITALS — BP 138/68 | HR 52

## 2017-04-10 DIAGNOSIS — I10 Essential (primary) hypertension: Secondary | ICD-10-CM | POA: Diagnosis not present

## 2017-04-10 NOTE — Patient Instructions (Signed)
It was nice to see you today  Continue taking your medications  Continue to check your blood pressure at home and call clinic if your readings stay consistently above 150/90

## 2017-04-10 NOTE — Progress Notes (Signed)
Patient ID: KALID GHAN                 DOB: 1940-11-18                      MRN: [id number]     HPI: Matthew Liu is a 76 y.o. male referred by Dr. Tamala Julian to HTN clinic. PMH is significant for CAD s/p MI in 2014, CVA, HLD, and HTN. At last visit 3 weeks ago, HCTZ dose was increased. His home BP cuff was reporting readings significantly lower than his clinic readings. Pt presents today for BP follow up.  Pt reports feeling well overall. Denies dizziness, blurred vison, falls, or headache. He has been checking his BP at home using a bicep cuff that he has had for 5-6 years. He states his wife brought his cuff in to his PCP office and his readings matched well with their clinic cuff. Home readings range 113/69 to 136/81, with most readings in the upper 110s and 130s. HR ranging 50s-60s.  Home reading in clinic today: R arm 160/84 (then again 152/76), L arm 156/94 Clinic reading: R arm 138/68  Current HTN meds: metoprolol 12.5mg  BID, quinapril 40mg  daily, HCTZ 12.5mg  daily Previously tried: amlodipine - LEE BP goal: <130/67mmHg  Family History: HTN in his mother  Social History: Denies tobacco use, drinks beer occasionally, denies illicit drug use.  Diet: Does not eat a lot of meat. Does not add salt to his food. Does not eat much sugar. Likes oatmeal for breakfast. Likes vegetables, occasionally has a burger. Has a cup of coffee and water in the morning. States his wife cooks a lot of food and he usually tries to eat more than he wants to to keep his wife happy.  Exercise: Yard work and walks a few miles a few days a week   Wt Readings from Last 3 Encounters:  02/27/17 167 lb 12.8 oz (76.1 kg)  08/02/16 165 lb (74.8 kg)  06/21/16 165 lb (74.8 kg)   BP Readings from Last 3 Encounters:  03/20/17 (!) 156/80  02/27/17 (!) 164/86  08/02/16 (!) 154/76   Pulse Readings from Last 3 Encounters:  03/20/17 (!) 56  02/27/17 60  08/02/16 68    Renal function: CrCl cannot be  calculated (Unknown ideal weight.).  Past Medical History:  Diagnosis Date  . Ankle fracture, left 2009   rod placed  . Anxiety   . Aspirin allergy    a. Anaphylactic rxn.  . Barrett's esophagus   . CAD (coronary artery disease)    a. Inf STEMI 07/2013: s/p DES x 2 from ostial to [facility] 07/13/13. Residual Cx disease for med rx (PCI if recurrent pain).  . Dyslipidemia   . ED (erectile dysfunction)   . GERD (gastroesophageal reflux disease)   . Hydrocele of testis   . Hyperglycemia    a. A1C 5.8 in 07/2013.  [name] Hypertension   . Myocardial infarction [facility])     Current Outpatient Prescriptions on File Prior to Visit  Medication Sig Dispense Refill  . acetaminophen (TYLENOL) 500 MG tablet Take 500 mg by mouth daily as needed for headache.     . ALPRAZolam (XANAX) 0.25 MG tablet Take 0.25 mg by mouth 3 (three) times daily as needed for anxiety.     [name] atorvastatin (LIPITOR) 80 MG tablet Take 40 mg by mouth daily.  5  . clopidogrel (PLAVIX) 75 MG tablet TAKE 1 TABLET BY MOUTH EVERY DAY 30  tablet 11  . hydrochlorothiazide (MICROZIDE) 12.5 MG capsule Take 1 capsule (12.5 mg total) by mouth daily. 30 capsule 11  . metoprolol tartrate (LOPRESSOR) 25 MG tablet TAKE 1/2 TABLETS BY MOUTH 2 TIMES DAILY. 30 tablet 0  . Multiple Vitamins-Minerals (SENIOR MULTIVITAMIN PLUS PO) Take 1 tablet by mouth every morning.     . nitroGLYCERIN (NITROSTAT) 0.4 MG SL tablet Place 1 tablet (0.4 mg total) under the tongue every 5 (five) minutes as needed for chest pain (up to 3 doses). 25 tablet 4  . pantoprazole (PROTONIX) 20 MG tablet Take 20 mg by mouth daily.   1  . quinapril (ACCUPRIL) 40 MG tablet Take 40 mg by mouth daily.  12   No current facility-administered medications on file prior to visit.     Allergies  Allergen Reactions  . Aspirin Hives and Shortness Of Breath    "Shortness of Breath" can't breathe  . Quinine Derivatives Hives    "looks like he has the measles"     Assessment/Plan:  1.  Hypertension - BP improved to 138/68 since last visit, although still above goal <130/35mmHg. Pt's home readings have not matched with clinic readings; at last visit, home cuff measured ~20 points lower systolic than clinic reading, at this visit, home cuff measured ~10-15 points higher systolic than clinic readings. Since many home readings have been in the 110s/70s or low 130s/70s, will defer any medication changes today. Advised pt to continue quinapril 40mg  daily, metoprolol tartrate 12.5mg  BID, and HCTZ 12.5mg  daily. He will continue to monitor his BP at home and call clinic if readings are consistently elevated > 150/66mmHg. F/u in HTN clinic as needed.   Kyair Ditommaso E. Supple, PharmD, CPP, [place] [address] [place], [place] [address] Phone: [phone]; Fax: [phone] 04/10/2017 2:00 PM

## 2017-04-13 ENCOUNTER — Other Ambulatory Visit: Payer: Self-pay | Admitting: Interventional Cardiology

## 2017-09-09 IMAGING — NM NM MISC PROCEDURE
3 series · 18 of 18 positions shown · non-contrast
Comparison: none

[Series 1: wbr_s-proj_st stress_(id)_sa · 6.5mm · 6.51mm/px · 6 of 64 frames shown (1 of 2)]
[frame 6/64]
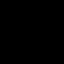
[frame 16/64]
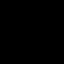
[frame 27/64]
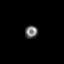
[frame 38/64]
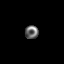
[frame 48/64]
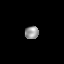
[frame 59/64]
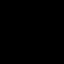

[Series 1: wbr_r-proj_st rest_(id)_sa · 6.5mm · 6.51mm/px · 6 of 64 frames shown]
[frame 6/64]
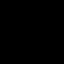
[frame 16/64]
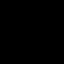
[frame 27/64]
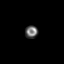
[frame 38/64]
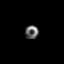
[frame 48/64]
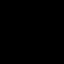
[frame 59/64]
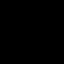

[Series 1: wbr_s-proj_st stress_(id)_sa · 6.5mm · 6.51mm/px · 6 of 512 frames shown (2 of 2)]
[frame 43/512]
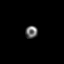
[frame 128/512]
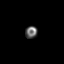
[frame 214/512]
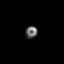
[frame 299/512]
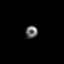
[frame 384/512]
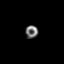
[frame 470/512]
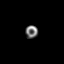

[18 of 18 positions shown; findings below may reference images not displayed]

Canned report from images found in remote index.

Refer to host system for actual result text.

## 2018-01-02 ENCOUNTER — Other Ambulatory Visit: Payer: Self-pay | Admitting: Interventional Cardiology

## 2018-01-08 ENCOUNTER — Inpatient Hospital Stay (HOSPITAL_COMMUNITY)
Admission: EM | Admit: 2018-01-08 | Discharge: 2018-01-12 | DRG: 552 | Disposition: A | Discharge status: Home / Self Care | Payer: Medicare Other | Attending: General Surgery | Admitting: General Surgery

## 2018-01-08 ENCOUNTER — Encounter (HOSPITAL_COMMUNITY): Payer: Self-pay | Admitting: Emergency Medicine

## 2018-01-08 DIAGNOSIS — K219 Gastro-esophageal reflux disease without esophagitis: Secondary | ICD-10-CM | POA: Diagnosis not present

## 2018-01-08 DIAGNOSIS — S22089A Unspecified fracture of T11-T12 vertebra, initial encounter for closed fracture: Secondary | ICD-10-CM | POA: Diagnosis not present

## 2018-01-08 DIAGNOSIS — S32000A Wedge compression fracture of unspecified lumbar vertebra, initial encounter for closed fracture: Secondary | ICD-10-CM | POA: Diagnosis present

## 2018-01-08 DIAGNOSIS — M542 Cervicalgia: Secondary | ICD-10-CM

## 2018-01-08 DIAGNOSIS — Y93H2 Activity, gardening and landscaping: Secondary | ICD-10-CM

## 2018-01-08 DIAGNOSIS — S12600A Unspecified displaced fracture of seventh cervical vertebra, initial encounter for closed fracture: Secondary | ICD-10-CM | POA: Diagnosis present

## 2018-01-08 DIAGNOSIS — S12690A Other displaced fracture of seventh cervical vertebra, initial encounter for closed fracture: Secondary | ICD-10-CM

## 2018-01-08 DIAGNOSIS — I2510 Atherosclerotic heart disease of native coronary artery without angina pectoris: Secondary | ICD-10-CM | POA: Diagnosis not present

## 2018-01-08 DIAGNOSIS — E785 Hyperlipidemia, unspecified: Secondary | ICD-10-CM | POA: Diagnosis present

## 2018-01-08 DIAGNOSIS — I252 Old myocardial infarction: Secondary | ICD-10-CM

## 2018-01-08 DIAGNOSIS — I10 Essential (primary) hypertension: Secondary | ICD-10-CM | POA: Diagnosis present

## 2018-01-08 DIAGNOSIS — R402363 Coma scale, best motor response, obeys commands, at hospital admission: Secondary | ICD-10-CM | POA: Diagnosis present

## 2018-01-08 DIAGNOSIS — Z8249 Family history of ischemic heart disease and other diseases of the circulatory system: Secondary | ICD-10-CM | POA: Diagnosis not present

## 2018-01-08 DIAGNOSIS — Z955 Presence of coronary angioplasty implant and graft: Secondary | ICD-10-CM

## 2018-01-08 DIAGNOSIS — R402253 Coma scale, best verbal response, oriented, at hospital admission: Secondary | ICD-10-CM | POA: Diagnosis not present

## 2018-01-08 DIAGNOSIS — S32010A Wedge compression fracture of first lumbar vertebra, initial encounter for closed fracture: Secondary | ICD-10-CM

## 2018-01-08 DIAGNOSIS — Z8673 Personal history of transient ischemic attack (TIA), and cerebral infarction without residual deficits: Secondary | ICD-10-CM | POA: Diagnosis not present

## 2018-01-08 DIAGNOSIS — W11XXXA Fall on and from ladder, initial encounter: Secondary | ICD-10-CM | POA: Diagnosis present

## 2018-01-08 DIAGNOSIS — S32018A Other fracture of first lumbar vertebra, initial encounter for closed fracture: Principal | ICD-10-CM | POA: Diagnosis present

## 2018-01-08 DIAGNOSIS — Z7902 Long term (current) use of antithrombotics/antiplatelets: Secondary | ICD-10-CM | POA: Diagnosis not present

## 2018-01-08 DIAGNOSIS — K567 Ileus, unspecified: Secondary | ICD-10-CM | POA: Diagnosis present

## 2018-01-08 DIAGNOSIS — R402143 Coma scale, eyes open, spontaneous, at hospital admission: Secondary | ICD-10-CM | POA: Diagnosis present

## 2018-01-08 DIAGNOSIS — M5030 Other cervical disc degeneration, unspecified cervical region: Secondary | ICD-10-CM | POA: Diagnosis present

## 2018-01-08 DIAGNOSIS — Y92007 Garden or yard of unspecified non-institutional (private) residence as the place of occurrence of the external cause: Secondary | ICD-10-CM | POA: Diagnosis not present

## 2018-01-08 DIAGNOSIS — W19XXXA Unspecified fall, initial encounter: Secondary | ICD-10-CM

## 2018-01-08 DIAGNOSIS — M545 Low back pain: Secondary | ICD-10-CM | POA: Diagnosis present

## 2018-01-08 DIAGNOSIS — S22080A Wedge compression fracture of T11-T12 vertebra, initial encounter for closed fracture: Secondary | ICD-10-CM

## 2018-01-08 MED ORDER — FENTANYL CITRATE (PF) 100 MCG/2ML IJ SOLN
50.0000 ug | Freq: Once | INTRAMUSCULAR | Status: AC
  Administered 2018-01-09: 50 ug via INTRAVENOUS
  Filled 2018-01-08: qty 2

## 2018-01-08 NOTE — ED Provider Notes (Signed)
[facility] EMERGENCY DEPARTMENT Provider Note   CSN: [id number] Arrival date & time: 01/08/18  2227     History   Chief Complaint Chief Complaint  Patient presents with  . Fall    HPI Matthew Liu is a 77 y.o. male with a hx of CAD, MI (s/p stent placement in 2014 taking Plavix), GERD, HTN, hyperglycemia presents to the Emergency Department complaining of acute, persistent, neck and back pain onset 6:30PM after falling off the bottom rung of the ladder.  He estimates the fall was approx 18 inches.  He reports striking the back of his head first.  Pt denies LOC.  He reports he was able to get up and walk without difficulty, but with pain.  Pt reports no loss of bowel or bladder control, no paresthesias or weakness.  Pt was taken to [facility].  CT head/neck/chest/Abd performed with findings of C7 and T1 compression fracture, widening of C2-3 and L1 superior end plate fracture with slight retropulsion.  Concern for possible ligamentous injury.  (CT reads sent with patient, however, unable to view images.)  Pt was placed in c-collar and transferred here to [facility].  Pt reports continued pain in his back worse with sitting up and better with laying flat.       The history is provided by the patient and medical records. No language interpreter was used.    Past Medical History:  Diagnosis Date  . Ankle fracture, left 2009   rod placed  . Anxiety   . Aspirin allergy    a. Anaphylactic rxn.  . Barrett's esophagus   . CAD (coronary artery disease)    a. Inf STEMI 07/2013: s/p DES x 2 from ostial to [facility] 07/13/13. Residual Cx disease for med rx (PCI if recurrent pain).  . Dyslipidemia   . ED (erectile dysfunction)   . GERD (gastroesophageal reflux disease)   . Hydrocele of testis   . Hyperglycemia    a. A1C 5.8 in 07/2013.  [name] Hypertension   . Myocardial infarction [facility])     Patient Active Problem List   Diagnosis Date Noted  . 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 Acute ischemic stroke ([place]) 09/15/2015  . Ankle fracture, left   . OSA (obstructive sleep apnea)   . Left sided numbness   . Left-sided weakness   . Erectile dysfunction 12/01/2014  . Aspirin allergy 07/16/2013  . Dyslipidemia 07/16/2013  . Hyperglycemia 07/16/2013  . 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 HTN (hypertension) 07/13/2013  . GERD (gastroesophageal reflux disease) 07/13/2013  . Barrett's esophagus 07/13/2013    Past Surgical History:  Procedure Laterality Date  . cataract surgery      bilateral   . CORONARY ANGIOPLASTY     stents   . HYDROCELE EXCISION Left 07/17/2014   Procedure: LEFT HYDROCELECTOMY;  Surgeon: Malka So, MD;  Location: [facility] ORS;  Service: Urology;  Laterality: Left;  . left ankle surgery  1999  . LEFT HEART CATHETERIZATION WITH CORONARY ANGIOGRAM N/A 07/13/2013   Procedure: LEFT HEART CATHETERIZATION WITH CORONARY ANGIOGRAM;  Surgeon: Sinclair Grooms, MD;  Location: [facility] CATH LAB;  Service: Cardiovascular;  Laterality: N/A;  . PERCUTANEOUS CORONARY STENT INTERVENTION (PCI-S)  07/13/2013   Procedure: PERCUTANEOUS CORONARY STENT INTERVENTION (PCI-S);  Surgeon: Sinclair Grooms, MD;  Location: [facility] CATH LAB;  Service: Cardiovascular;;  Home Medications    Prior to Admission medications   Medication Sig Start Date End Date Taking? Authorizing Provider  acetaminophen (TYLENOL) 500 MG tablet Take 500 mg by mouth daily as needed for headache.    Yes [provider]  ALPRAZolam (XANAX) 0.25 MG tablet Take 0.25 mg by mouth 3 (three) times daily as needed for anxiety.  06/29/13  Yes [provider]  atorvastatin (LIPITOR) 80 MG tablet Take 40 mg by mouth daily. 01/03/17  Yes [provider]  clopidogrel (PLAVIX) 75 MG tablet TAKE 1 TABLET BY MOUTH EVERY DAY 03/24/17  Yes Belva Crome, MD  hydrochlorothiazide (MICROZIDE) 12.5 MG capsule Take 1 capsule  (12.5 mg total) by mouth daily. 03/20/17 01/09/18 Yes Belva Crome, MD  lisinopril (PRINIVIL,ZESTRIL) 40 MG tablet Take 40 mg by mouth at bedtime.   Yes [provider]  metoprolol tartrate (LOPRESSOR) 25 MG tablet Take 0.5 tablets (12.5 mg total) by mouth daily. Please make yearly appt with Dr. Tamala Julian for May before anymore refills. 1st attempt Patient taking differently: Take 12.5 mg by mouth 2 (two) times daily. Please make yearly appt with Dr. Tamala Julian for May before anymore refills. 1st attempt 01/03/18  Yes Belva Crome, MD  Multiple Vitamins-Minerals (SENIOR MULTIVITAMIN PLUS PO) Take 1 tablet by mouth every morning.    Yes [provider]  nitroGLYCERIN (NITROSTAT) 0.4 MG SL tablet Place 1 tablet (0.4 mg total) under the tongue every 5 (five) minutes as needed for chest pain (up to 3 doses). 07/16/13  Yes Dunn, Dayna N, PA-C  pantoprazole (PROTONIX) 20 MG tablet Take 20 mg by mouth daily.  05/26/16  Yes [provider]    Family History Family History  Problem Relation Age of Onset  . Hypertension Mother   . Stroke Neg Hx     Social History Social History   Tobacco Use  . Smoking status: Never Smoker  . Smokeless tobacco: Never Used  Substance Use Topics  . Alcohol use: Yes    Comment: occasional beer   . Drug use: No     Allergies   Aspirin and Quinine derivatives   Review of Systems Review of Systems  Constitutional: Negative for appetite change, diaphoresis, fatigue, fever and unexpected weight change.  HENT: Negative for mouth sores.   Eyes: Negative for visual disturbance.  Respiratory: Negative for cough, chest tightness, shortness of breath and wheezing.   Cardiovascular: Negative for chest pain.  Gastrointestinal: Negative for abdominal pain, constipation, diarrhea, nausea and vomiting.  Endocrine: Negative for polydipsia, polyphagia and polyuria.  Genitourinary: Negative for dysuria, frequency, hematuria and urgency.  Musculoskeletal:  Positive for back pain and neck pain. Negative for neck stiffness.  Skin: Negative for rash.  Allergic/Immunologic: Negative for immunocompromised state.  Neurological: Negative for syncope, weakness, light-headedness, numbness and headaches.  Hematological: Does not bruise/bleed easily.  Psychiatric/Behavioral: Negative for sleep disturbance. The patient is not nervous/anxious.      Physical Exam Updated Vital Signs BP (!) 153/73 (BP Location: Left Arm)   Pulse 89   Temp 98.6 F (37 C) (Oral)   Resp 18   Ht 5\' 9"  (1.753 m)   Wt 74.8 kg (165 lb)   SpO2 95%   BMI 24.37 kg/m   Physical Exam  Constitutional: He appears well-developed and well-nourished. No distress.  HENT:  Head: Normocephalic.  Small amount of bruising to the central upper lip  Eyes: Pupils are equal, round, and reactive to light. Conjunctivae are normal.  Neck:  C-collar in place  Cardiovascular: Normal rate, regular rhythm and intact distal pulses.  Pulmonary/Chest: Effort normal and breath sounds normal. No respiratory distress. He has no wheezes.  No contusions to the anterior chest  Abdominal: Soft. He exhibits no distension. There is no tenderness.  Musculoskeletal:  FROM of upper extremities. Pt with FROM of ankles and toes.  Hips and knees not ranged due to pain.  Neurological: He is alert.  Speech is clear and goal oriented, follows commands Normal 5/5 strength in upper and lower extremities bilaterally including dorsiflexion and plantar flexion, strong and equal grip strength; strong shoulder shrug Sensation normal to light touch in all dermatomes Moves extremities without ataxia, coordination intact Gait testing deferred No Clonus  Skin: Skin is warm and dry. No rash noted. He is not diaphoretic. No erythema.  Psychiatric: He has a normal mood and affect. His behavior is normal.  Nursing note and vitals reviewed.    ED Treatments / Results   Procedures Procedures (including critical care  time)  Medications Ordered in ED Medications  fentaNYL (SUBLIMAZE) injection 50 mcg (50 mcg Intravenous Given 01/09/18 0005)     Initial Impression / Assessment and Plan / ED Course  I have reviewed the triage vital signs and the nursing notes.  Pertinent labs & imaging results that were available during my care of the patient were reviewed by me and considered in my medical decision making (see chart for details).  Clinical Course as of Jan 10 148  Mon Jan 08, 2018  2329 Patient discussed with Dr. Randal Buba.  MRI ordered. EMTALA documentation notes Dr. Kieth Brightly as accepting.  Will discuss with Trauma.  Basic labs completed at outside facility.    [HM]  2345 Discussed with Dr. Kieth Brightly who will admit to Trauma   [HM]    Clinical Course User Index [HM] Emil Weigold, Jarrett Soho, PA-C    Patient presents with fall from ladder, approximately 18 inches.  Evaluated at outside facility was CT scans concerning for possible ligamentous injury and L1 superior endplate fracture with retropulsion in addition to several compression fractures.  She given additional pain control here in the emergency department.  Discussed with trauma who will admit and will consult neurosurgery.  MRI ordered to evaluate for further injury.  Patient is resting comfortably at this time.  Final Clinical Impressions(s) / ED Diagnoses   Final diagnoses:  Fall, initial encounter  Compression fracture of C7 vertebra ([place])  T12 compression fracture [facility])  Neck pain    ED Discharge Orders    None       Agapito Games 01/09/18 0149    Palumbo, April, MD 01/09/18 Royal Kunia, April, MD 01/09/18 7628

## 2018-01-08 NOTE — ED Triage Notes (Signed)
Pt transferred from [facility] ED for further trauma eval, pt states he fell backwards from ladder after missing last rung on ladder about 1800-1830 last night. Denies LOC Pt was able to ambulate and was brought to ED by wife. Pt arrived with ccollar in place, pain 7-8 in ribs and abdomen.  Pt found to have compression fx on CT C7-T1 ? C2-3 injury. Moving all extremities.

## 2018-01-09 ENCOUNTER — Emergency Department (HOSPITAL_COMMUNITY): Payer: Medicare Other

## 2018-01-09 DIAGNOSIS — S32000A Wedge compression fracture of unspecified lumbar vertebra, initial encounter for closed fracture: Secondary | ICD-10-CM | POA: Diagnosis present

## 2018-01-09 LAB — BASIC METABOLIC PANEL
ANION GAP: 10 (ref 5–15)
BUN: 20 mg/dL (ref 6–20)
CO2: 24 mmol/L (ref 22–32)
Calcium: 8.8 mg/dL — ABNORMAL LOW (ref 8.9–10.3)
Chloride: 101 mmol/L (ref 101–111)
Creatinine, Ser: 1.03 mg/dL (ref 0.61–1.24)
GFR calc Af Amer: >60 mL/min (ref >60)
GLUCOSE: 171 mg/dL — AB (ref 65–99)
POTASSIUM: 4.1 mmol/L (ref 3.5–5.1)
Sodium: 135 mmol/L (ref 135–145)

## 2018-01-09 LAB — CBC WITH DIFFERENTIAL/PLATELET
BASOS ABS: 0 10*3/uL (ref 0.0–0.1)
BASOS PCT: 0 %
Eosinophils Absolute: 0.1 10*3/uL (ref 0.0–0.7)
Eosinophils Relative: 2 %
HEMATOCRIT: 38.9 % — AB (ref 39.0–52.0)
HEMOGLOBIN: 13.4 g/dL (ref 13.0–17.0)
Lymphocytes Relative: 19 %
Lymphs Abs: 1.1 10*3/uL (ref 0.7–4.0)
MCH: 34 pg (ref 26.0–34.0)
MCHC: 34.4 g/dL (ref 30.0–36.0)
MCV: 98.7 fL (ref 78.0–100.0)
MONOS PCT: 4 %
Monocytes Absolute: 0.2 10*3/uL (ref 0.1–1.0)
NEUTROS ABS: 4.3 10*3/uL (ref 1.7–7.7)
NEUTROS PCT: 75 %
Platelets: 158 10*3/uL (ref 150–400)
RBC: 3.94 MIL/uL — ABNORMAL LOW (ref 4.22–5.81)
RDW: 13.1 % (ref 11.5–15.5)
WBC: 5.7 10*3/uL (ref 4.0–10.5)

## 2018-01-09 MED ORDER — ACETAMINOPHEN 325 MG PO TABS: 650.0000 mg | ORAL_TABLET | ORAL | Status: DC | PRN

## 2018-01-09 MED ORDER — METOPROLOL TARTRATE 12.5 MG HALF TABLET
12.5000 mg | ORAL_TABLET | Freq: Every day | ORAL | Status: DC
  Administered 2018-01-09 – 2018-01-12 (×4): 12.5 mg via ORAL
  Filled 2018-01-09 (×4): qty 1

## 2018-01-09 MED ORDER — MORPHINE SULFATE (PF) 4 MG/ML IV SOLN
2.0000 mg | INTRAVENOUS | Status: DC | PRN
  Administered 2018-01-09: 4 mg via INTRAVENOUS
  Filled 2018-01-09: qty 1

## 2018-01-09 MED ORDER — ONDANSETRON HCL 4 MG/2ML IJ SOLN
4.0000 mg | Freq: Four times a day (QID) | INTRAMUSCULAR | Status: DC | PRN
  Administered 2018-01-10 – 2018-01-11 (×2): 4 mg via INTRAVENOUS
  Filled 2018-01-09 (×2): qty 2

## 2018-01-09 MED ORDER — ONDANSETRON 4 MG PO TBDP: 4.0000 mg | ORAL_TABLET | Freq: Four times a day (QID) | ORAL | Status: DC | PRN

## 2018-01-09 MED ORDER — HYDROCODONE-ACETAMINOPHEN 5-325 MG PO TABS
1.0000 | ORAL_TABLET | ORAL | Status: DC | PRN
  Administered 2018-01-09 – 2018-01-11 (×5): 1 via ORAL
  Filled 2018-01-09 (×5): qty 1

## 2018-01-09 MED ORDER — DOCUSATE SODIUM 100 MG PO CAPS
100.0000 mg | ORAL_CAPSULE | Freq: Two times a day (BID) | ORAL | Status: DC
  Administered 2018-01-09 – 2018-01-11 (×6): 100 mg via ORAL
  Filled 2018-01-09 (×7): qty 1

## 2018-01-09 MED ORDER — LISINOPRIL 20 MG PO TABS
40.0000 mg | ORAL_TABLET | Freq: Every day | ORAL | Status: DC
  Administered 2018-01-09 – 2018-01-11 (×3): 40 mg via ORAL
  Filled 2018-01-09 (×3): qty 2

## 2018-01-09 MED ORDER — NITROGLYCERIN 0.4 MG SL SUBL: 0.4000 mg | SUBLINGUAL_TABLET | SUBLINGUAL | Status: DC | PRN

## 2018-01-09 MED ORDER — HYDROCHLOROTHIAZIDE 12.5 MG PO CAPS
12.5000 mg | ORAL_CAPSULE | Freq: Every day | ORAL | Status: DC
  Administered 2018-01-09 – 2018-01-12 (×4): 12.5 mg via ORAL
  Filled 2018-01-09 (×4): qty 1

## 2018-01-09 MED ORDER — PANTOPRAZOLE SODIUM 20 MG PO TBEC
20.0000 mg | DELAYED_RELEASE_TABLET | Freq: Every day | ORAL | Status: DC
  Administered 2018-01-09 – 2018-01-10 (×2): 20 mg via ORAL
  Filled 2018-01-09 (×3): qty 1

## 2018-01-09 NOTE — Progress Notes (Signed)
Occupational Therapy Evaluation Patient Details Name: Matthew Liu MRN: [id number] DOB: 04/27/1941 Today's Date: 01/09/2018    History of Present Illness Pt is a 77 y/o male admitted following fall from ladder. Found to have mild L1 superior endplate compression fx. Per neurosurgery notes, imaging of C Spine revealed likely degenerative changes. PMH includes CAD s/p stent placement, MI, HTN, and L ankle fx.    Clinical Impression   PTA, pt lived with his wife, was independent with ADL and mobility and was very active. Pt currently requires min A with ADL and min guard A with mobility without an AD. Began education on back precautions and compensatory techniques for ADL. Per Dr. Marchelle Folks note, pt to use lumbar corsett with thoracic extension, however, pt has basic lumbar corsett in his room - made nsg aware to get clarification on correct brace. Will follow up tomorrow to complete education to facilitate safe DC home with wife.     Follow Up Recommendations  No OT follow up;Supervision - Intermittent    Equipment Recommendations  None recommended by OT    Recommendations for Other Services       Precautions / Restrictions Precautions Precautions: Back Precaution Booklet Issued: Yes (comment) Precaution Comments: Reviewed back precautions with pt.  Required Braces or Orthoses: Spinal Brace Spinal Brace: Lumbar corset(Dr. Pool's note says lumbar with thoracic extension - made nsg aware) Restrictions Weight Bearing Restrictions: No      Mobility Bed Mobility Overal bed mobility: Needs Assistance Bed Mobility: Sit to Sidelying;Rolling Rolling: Supervision       Sit to sidelying: Supervision General bed mobility comments: vc for correct log rolling technique  Heavy use of rails - recommend pt practice without rails to simulate bed at home  Transfers Overall transfer level: Needs assistance Equipment used: 1 person hand held assist Transfers: Sit to/from Stand Sit to Stand: Min  guard         General transfer comment: Min guard for safety.   Balance Overall balance assessment: Needs assistance Sitting-balance support: No upper extremity supported;Feet supported Sitting balance-Leahy Scale: Good     Standing balance support: Bilateral upper extremity supported;During functional activity Standing balance-Leahy Scale: Fair Standing balance comment: did not use RW during session. States "I don't think I need it"                           ADL either performed or assessed with clinical judgement   ADL Overall ADL's : Needs assistance/impaired     Grooming: Min guard;Standing   Upper Body Bathing: Set up;Supervision/ safety;Sitting   Lower Body Bathing: Minimal assistance;Sit to/from stand   Upper Body Dressing : Supervision/safety;Set up;Sitting   Lower Body Dressing: Minimal assistance;Sit to/from stand   Toilet Transfer: Min guard;Ambulation;Comfort height toilet;Grab bars   Toileting- Clothing Manipulation and Hygiene: Minimal assistance;Sit to/from stand       Functional mobility during ADLs: Min guard;Cueing for safety General ADL Comments: Pt able to cross B feet over knees with minimal difficulty; Requires mod vc to adhere to precautions; May benefit from AE; Will benefit form educaiton on use of reacher for safety; wife/pt educated on donning/doffing brace     Vision         Perception     Praxis      Pertinent Vitals/Pain Pain Assessment: 0-10 Pain Score: 5  Pain Location: back  Pain Descriptors / Indicators: Aching;Discomfort Pain Intervention(s): Limited activity within patient's tolerance     Hand  Dominance Right   Extremity/Trunk Assessment Upper Extremity Assessment Upper Extremity Assessment: Overall WFL for tasks assessed   Lower Extremity Assessment Lower Extremity Assessment: Defer to PT evaluation   Cervical / Trunk Assessment Cervical / Trunk Assessment: Other exceptions Cervical / Trunk  Exceptions: L1 compression fx   Communication Communication Communication: No difficulties   Cognition Arousal/Alertness: Awake/alert Behavior During Therapy: WFL for tasks assessed/performed Overall Cognitive Status: Within Functional Limits for tasks assessed                                     General Comments  Pt's wife present during session and pt's grandaughter present at end of session. Pt's grandaughter asking questions about mobility and all questions answered.     Exercises     Shoulder Instructions      Home Living Family/patient expects to be discharged to:: Private residence Living Arrangements: Spouse/significant other Available Help at Discharge: Family Type of Home: House Home Access: Stairs to enter Technical brewer of Steps: 2 Entrance Stairs-Rails: (2 posts ) Home Layout: One level     Bathroom Shower/Tub: Teacher, early years/pre: Standard Bathroom Accessibility: Yes How Accessible: Accessible via walker Home Equipment: [place] - 2 wheels;Bedside commode;Crutches;Shower seat;Hand held shower head          Prior Functioning/Environment Level of Independence: Independent                 OT Problem List: Decreased activity tolerance;Decreased safety awareness;Decreased knowledge of use of DME or AE;Decreased knowledge of precautions;Pain      OT Treatment/Interventions: Self-care/ADL training;DME and/or AE instruction;Therapeutic activities;Patient/family education    OT Goals(Current goals can be found in the care plan section) Acute Rehab OT Goals Patient Stated Goal: to go home  OT Goal Formulation: With patient Time For Goal Achievement: 01/23/18 Potential to Achieve Goals: Good  OT Frequency: Min 3X/week   Barriers to D/C:            Co-evaluation              AM-PAC PT "6 Clicks" Daily Activity     Outcome Measure Help from another person eating meals?: None Help from another person taking  care of personal grooming?: A Little Help from another person toileting, which includes using toliet, bedpan, or urinal?: A Little Help from another person bathing (including washing, rinsing, drying)?: A Little Help from another person to put on and taking off regular upper body clothing?: None Help from another person to put on and taking off regular lower body clothing?: A Little 6 Click Score: 20   End of Session Equipment Utilized During Treatment: Gait belt;Back brace Nurse Communication: Mobility status;Other (comment)(type of corsett needed)  Activity Tolerance: Patient tolerated treatment well Patient left: in bed;with call bell/phone within reach;with bed alarm set;with family/visitor present  OT Visit Diagnosis: Unsteadiness on feet (R26.81);History of falling (Z91.81);Pain Pain - part of body: (back)                Time: 2197-5883 OT Time Calculation (min): 22 min Charges:  OT General Charges $OT Visit: 1 Visit OT Evaluation $OT Eval Low Complexity: 1 Low G-Codes:     [place], OT/L  [id number] 01/09/2018  Shadd Dunstan,HILLARY 01/09/2018, 4:38 PM

## 2018-01-09 NOTE — ED Notes (Addendum)
Attempted report to 3W x1. °

## 2018-01-09 NOTE — Progress Notes (Signed)
Orthopedic Tech Progress Note Patient Details:  Matthew Liu [birth year] [id number]  Patient ID: Matthew Liu, male   DOB: 11-30-1940, 77 y.o.   MRN: [id number]   Matthew Liu 01/09/2018, 11:28 AMCalled [employer] for aspen collar and lumbar brace.

## 2018-01-09 NOTE — Care Management Note (Signed)
Case Management Note  Patient Details  Name: BERNABE DORCE MRN: [id number] Date of Birth: May 05, 1941  Subjective/Objective:  Pt is a 77 y/o male admitted following fall from ladder. Found to have mild L1 superior endplate compression fx.  PTA, pt independent, lives at home with spouse.                 Action/Plan: PT/OT recommending no OP follow up.  Family able to provide assistance at dc.   Expected Discharge Date:                  Expected Discharge Plan:  Home/Self Care  In-House Referral:  Clinical Social Work  Discharge planning Services  CM Consult  Post Acute Care Choice:    Choice offered to:     DME Arranged:    DME Agency:     HH Arranged:    HH Agency:     Status of Service:  In process, will continue to follow  If discussed at Long Length of Stay Meetings, dates discussed:    Additional Comments:  Reinaldo Raddle, RN, BSN  Trauma/Neuro ICU Case Manager [phone]

## 2018-01-09 NOTE — Evaluation (Signed)
Physical Therapy Evaluation Patient Details Name: Matthew Liu MRN: [id number] DOB: [birth year] Today's Date: 01/09/2018   History of Present Illness  Pt is a 77 y/o male admitted following fall from ladder. Found to have mild L1 superior endplate compression fx. Per neurosurgery notes, imaging of C Spine revealed likely degenerative changes. PMH includes CAD s/p stent placement, MI, HTN, and L ankle fx.   Clinical Impression  Pt admitted secondary to problem above with deficits below. Pt presenting with back pain, however, tolerated gait and stair training well. Required min guard A for mobility with RW and during stair training as well. Reviewed back precautions with pt and pt's wife. Will continue to follow acutely to maximize functional mobility independence and safety.     Follow Up Recommendations No PT follow up;Supervision for mobility/OOB(would benefit from outpatient PT once cleared by MD)    Equipment Recommendations  None recommended by PT    Recommendations for Other Services       Precautions / Restrictions Precautions Precautions: Back Precaution Booklet Issued: Yes (comment) Precaution Comments: Reviewed back precautions with pt.  Required Braces or Orthoses: Spinal Brace Spinal Brace: Lumbar corset Restrictions Weight Bearing Restrictions: No      Mobility  Bed Mobility Overal bed mobility: Needs Assistance Bed Mobility: Sit to Sidelying;Rolling Rolling: Supervision       Sit to sidelying: Supervision General bed mobility comments: Pt sitting at EOB with NT upon entry. Supervision to ensure log roll technique. Required cues for sequencing to return to supine.  Transfers Overall transfer level: Needs assistance Equipment used: Rolling walker (2 wheeled) Transfers: Sit to/from Stand Sit to Stand: Min guard         General transfer comment: Min guard for safety. Demonstrated safe hand placement. Increased time required secondary to pain.    Ambulation/Gait Ambulation/Gait assistance: Min guard Ambulation Distance (Feet): 150 Feet Assistive device: Rolling walker (2 wheeled) Gait Pattern/deviations: Step-through pattern;Decreased stride length;Trunk flexed Gait velocity: Decreased  Gait velocity interpretation: Below normal speed for age/gender General Gait Details: Slow, overall steady gait with use of RW. Verbal cues for upright posture. Min guard for safety. Educated about walking program to perform at home.   Stairs Stairs: Yes Stairs assistance: Min guard Stair Management: Two rails;Alternating pattern;Step to pattern;Forwards Number of Stairs: 3 General stair comments: Alternating pattern for ascending and step to pattern for descending. Verbal cues for sequencing and assist required at home.   Wheelchair Mobility    Modified Rankin (Stroke Patients Only)       Balance Overall balance assessment: Needs assistance Sitting-balance support: No upper extremity supported;Feet supported Sitting balance-Leahy Scale: Good     Standing balance support: Bilateral upper extremity supported;During functional activity Standing balance-Leahy Scale: Poor Standing balance comment: Reliant on BUE support                              Pertinent Vitals/Pain Pain Assessment: 0-10 Pain Score: 7  Pain Location: back  Pain Descriptors / Indicators: Aching Pain Intervention(s): Limited activity within patient's tolerance;Monitored during session;Repositioned    Home Living Family/patient expects to be discharged to:: Private residence Living Arrangements: Spouse/significant other Available Help at Discharge: Family Type of Home: House Home Access: Stairs to enter Entrance Stairs-Rails: (2 posts ) Technical brewer of Steps: 2 Home Layout: One level Home Equipment: Environmental consultant - 2 wheels;Bedside commode;Crutches;Shower seat      Prior Function Level of Independence: Independent  Hand Dominance        Extremity/Trunk Assessment   Upper Extremity Assessment Upper Extremity Assessment: Defer to OT evaluation    Lower Extremity Assessment Lower Extremity Assessment: Overall WFL for tasks assessed    Cervical / Trunk Assessment Cervical / Trunk Assessment: Other exceptions Cervical / Trunk Exceptions: L1 compression fx  Communication   Communication: No difficulties  Cognition Arousal/Alertness: Awake/alert Behavior During Therapy: WFL for tasks assessed/performed Overall Cognitive Status: Within Functional Limits for tasks assessed                                        General Comments General comments (skin integrity, edema, etc.): Pt's wife present during session and pt's grandaughter present at end of session. Pt's grandaughter asking questions about mobility and all questions answered.     Exercises     Assessment/Plan    PT Assessment Patient needs continued PT services  PT Problem List Decreased strength;Decreased balance;Decreased mobility;Decreased knowledge of use of DME;Decreased knowledge of precautions;Pain       PT Treatment Interventions DME instruction;Gait training;Stair training;Functional mobility training;Therapeutic activities;Therapeutic exercise;Balance training;Neuromuscular re-education;Patient/family education    PT Goals (Current goals can be found in the Care Plan section)  Acute Rehab PT Goals Patient Stated Goal: to go home  PT Goal Formulation: With patient/family Time For Goal Achievement: 01/23/18 Potential to Achieve Goals: Good    Frequency Min 3X/week   Barriers to discharge        Co-evaluation               AM-PAC PT "6 Clicks" Daily Activity  Outcome Measure Difficulty turning over in bed (including adjusting bedclothes, sheets and blankets)?: A Little Difficulty moving from lying on back to sitting on the side of the bed? : A Little Difficulty sitting down on and  standing up from a chair with arms (e.g., wheelchair, bedside commode, etc,.)?: Unable Help needed moving to and from a bed to chair (including a wheelchair)?: A Little Help needed walking in hospital room?: A Little Help needed climbing 3-5 steps with a railing? : A Little 6 Click Score: 16    End of Session Equipment Utilized During Treatment: Gait belt;Back brace Activity Tolerance: Patient tolerated treatment well Patient left: in bed;with call bell/phone within reach;with family/visitor present;with nursing/sitter in room Nurse Communication: Mobility status PT Visit Diagnosis: Other abnormalities of gait and mobility (R26.89);Pain Pain - part of body: (back )    Time: 6440-3474 PT Time Calculation (min) (ACUTE ONLY): 32 min   Charges:   PT Evaluation $PT Eval Low Complexity: 1 Low PT Treatments $Gait Training: 8-22 mins   PT G Codes:        Leighton Ruff, PT, DPT  Acute Rehabilitation Services  Pager: [phone]   Rudean Hitt 01/09/2018, 2:30 PM

## 2018-01-09 NOTE — H&P (Signed)
Activation and Reason: consult, fall from 18in  Primary Survey: airway patent, breath sounds bilateral and present, pulses intact  Matthew Liu is an 77 y.o. male.  HPI: 77 yo male was trimming his hedges on a small ladder. He went to get down but missed a step and fell. He estimated being 18in from the ground when stepping down. His back hit first and then his head. He did not lose consciousness. He complains of pain in his back when moving between beds.  Past Medical History:  Diagnosis Date  . Ankle fracture, left 2009   rod placed  . Anxiety   . Aspirin allergy    a. Anaphylactic rxn.  . Barrett's esophagus   . CAD (coronary artery disease)    a. Inf STEMI 07/2013: s/p DES x 2 from ostial to [facility] 07/13/13. Residual Cx disease for med rx (PCI if recurrent pain).  . Dyslipidemia   . ED (erectile dysfunction)   . GERD (gastroesophageal reflux disease)   . Hydrocele of testis   . Hyperglycemia    a. A1C 5.8 in 07/2013.  [name] Hypertension   . Myocardial infarction [facility])     Past Surgical History:  Procedure Laterality Date  . cataract surgery      bilateral   . CORONARY ANGIOPLASTY     stents   . HYDROCELE EXCISION Left 07/17/2014   Procedure: LEFT HYDROCELECTOMY;  Surgeon: Malka So, MD;  Location: [facility];  Service: Urology;  Laterality: Left;  . left ankle surgery  1999  . LEFT HEART CATHETERIZATION WITH CORONARY ANGIOGRAM N/A 07/13/2013   Procedure: LEFT HEART CATHETERIZATION WITH CORONARY ANGIOGRAM;  Surgeon: Sinclair Grooms, MD;  Location: [facility] CATH LAB;  Service: Cardiovascular;  Laterality: N/A;  . PERCUTANEOUS CORONARY STENT INTERVENTION (PCI-S)  07/13/2013   Procedure: PERCUTANEOUS CORONARY STENT INTERVENTION (PCI-S);  Surgeon: Sinclair Grooms, MD;  Location: [facility] CATH LAB;  Service: Cardiovascular;;    Family History  Problem Relation Age of Onset  . Hypertension Mother   . Stroke Neg Hx     Social History:  reports that he has never smoked. He has never used  smokeless tobacco. He reports that he drinks alcohol. He reports that he does not use drugs.  Allergies:  Allergies  Allergen Reactions  . Aspirin Hives and Shortness Of Breath    "Shortness of Breath" can't breathe  . Quinine Derivatives Hives    "looks like he has the measles"    Medications: I have reviewed the patient's current medications.  No results found for this or any previous visit (from the past 48 hour(s)).  No results found.  Review of Systems  Constitutional: Negative for chills and fever.  HENT: Negative for hearing loss.   Eyes: Negative for blurred vision and double vision.  Respiratory: Negative for cough and hemoptysis.   Cardiovascular: Negative for chest pain and palpitations.  Gastrointestinal: Negative for abdominal pain, nausea and vomiting.  Genitourinary: Negative for dysuria and urgency.  Musculoskeletal: Positive for back pain. Negative for myalgias and neck pain.  Skin: Negative for itching and rash.  Neurological: Negative for dizziness, tingling and headaches.  Endo/Heme/Allergies: Does not bruise/bleed easily.  Psychiatric/Behavioral: Negative for depression and suicidal ideas.   Blood pressure 120/70, pulse (!) 144, temperature 98.6 F (37 C), temperature source Oral, resp. rate 18, height 5\' 9"  (1.753 m), weight 74.8 kg (165 lb), SpO2 (!) 89 %. Physical Exam  Constitutional: He appears well-developed and well-nourished.  HENT:  Head: Not microcephalic. Head is without raccoon's eyes, without abrasion and without contusion.  Right Ear: No drainage or swelling. No foreign bodies.  Left Ear: No drainage or swelling. No foreign bodies.  Nose: No mucosal edema, rhinorrhea or nose lacerations.  Mouth/Throat: Oropharynx is clear and moist and mucous membranes are normal.  Eyes: Pupils are equal, round, and reactive to light. EOM are normal. Right eye exhibits no discharge. Left eye exhibits no discharge.  Neck:  Collar in place  Cardiovascular:    Pulses:      Carotid pulses are 2+ on the right side, and 2+ on the left side.      Radial pulses are 2+ on the right side, and 2+ on the left side.       Dorsalis pedis pulses are 2+ on the right side, and 2+ on the left side.  Respiratory: No apnea. He has no decreased breath sounds. He has no wheezes. He has no rhonchi. He has no rales.  GI: He exhibits no shifting dullness and no distension. There is no tenderness. There is no rigidity, no guarding, no tenderness at McBurney's point and negative Murphy's sign.  Neurological: He has normal strength. No cranial nerve deficit or sensory deficit. GCS eye subscore is 4. GCS verbal subscore is 5. GCS motor subscore is 6.  Psychiatric: His speech is normal and behavior is normal. Thought content normal. His mood appears anxious.      Assessment/Plan: 17 male fall from 18in with anterior widening of c2-c3, L1 superior end plate fracture with slight retropulsion -MRI neck to further evaluate for ligamentous injury -NPO -pain control -maintain collar -consult neurosurgeon after MRI results  Procedures: none  Arta Bruce Kinsinger 01/09/2018, 12:49 AM

## 2018-01-09 NOTE — Progress Notes (Signed)
PT Cancellation Note  Patient Details Name: Matthew Liu MRN: [id number] DOB: [birth year]   Cancelled Treatment:    Reason Eval/Treat Not Completed: Other (comment) Pt does not have lumbar corset required to ambulate yet, and activity orders to lay flat and log roll. Will await brace arrival and updated activity orders and follow up as schedule allows.   Leighton Ruff, PT, DPT  Acute Rehabilitation Services  Pager: [phone]  Rudean Hitt 01/09/2018, 11:19 AM

## 2018-01-09 NOTE — Progress Notes (Signed)
LOS: 0 days   Subjective: [name] is doing well today. He has his wife, daughter, and friend at bedside. He reports that he was trimming branches on a ladder with the help of 3 others. He missed the last step, fell approxamately 18inches. He made contact with the right foot, then fell to his back, his head was the last thing to make contact with the ground. He denies LOC, he ambulated to the front door to call for his wife immediately after the fall. His wife drove him to [facility] hospital where he was transferred to [facility] hospital.  He reports feeling well, he can recall all events of the fall.  PMH: HTN, CAD, MI x 2, CVA x 1 shingles,  Surgical history: left ankle injury requiring surgery Allergies: asprin causes hives Medications: lisinopril 40mg  q day, Metropolol 12.5mg  BID, plavix 75mg  q day, hydrochlorothiazide 12.5mg  q day, atorvastatin 40mg  q day,   Review of Systems  Constitutional: Negative for chills and fever.  HENT: Negative for hearing loss.   Eyes: Positive for blurred vision.  Respiratory: Negative for cough and shortness of breath.   Cardiovascular: Negative for chest pain and palpitations.  Gastrointestinal: Negative for abdominal pain, heartburn, nausea and vomiting.  Genitourinary: Negative for frequency and urgency.  Musculoskeletal: Positive for back pain. Negative for falls, joint pain and neck pain.  Skin: Negative for itching and rash.  Neurological: Negative for dizziness, tingling, focal weakness, weakness and headaches.  Endo/Heme/Allergies: Does not bruise/bleed easily.  Psychiatric/Behavioral: Negative for depression.     Objective: Vital signs in last 24 hours: Temp:  [98.6 F (37 C)] 98.6 F (37 C) (04/01 2254) Pulse Rate:  [84-92] 84 (04/02 0730) Resp:  [16-18] 16 (04/02 0700) BP: (115-153)/(64-84) 121/68 (04/02 0730) SpO2:  [90 %-97 %] 95 % (04/02 0730) Weight:  [74.8 kg (165 lb)] 74.8 kg (165 lb) (04/01 2255)     Laboratory  CBC No results  for input(s): WBC, HGB, HCT, PLT in the last 72 hours. BMET No results for input(s): NA, K, CL, CO2, GLUCOSE, BUN, CREATININE, CALCIUM in the last 72 hours.   Physical Exam Physical Exam  Constitutional: He is oriented to person, place, and time. He appears well-developed and well-nourished.  HENT:  Localized Swelling to occiput  Eyes: Pupils are equal, round, and reactive to light.  Neck: Normal range of motion.  Cardiovascular: Normal rate and regular rhythm.  Pulmonary/Chest: Effort normal and breath sounds normal.  Abdominal: Soft. Bowel sounds are normal.  Musculoskeletal: He exhibits tenderness.  Tenderness over spinus process to T1  Neurological: He is alert and oriented to person, place, and time. He has normal strength. No cranial nerve deficit or sensory deficit. GCS eye subscore is 4. GCS verbal subscore is 5. GCS motor subscore is 6.  Negative pronator drift   Skin: Skin is warm and dry.  Psychiatric: He has a normal mood and affect.      Assessment/Plan:  [name] is a 77yo M s/p fall from ladder with fx to L1  Fx L1: brace to lumbar spine. PT/OT HTN: resume lisinopril, hydrochlorothiazide Hx of MI/CVA: resume plavix, metropolol, atorvastatin  FEN: normal diet  VTE: lovenox profolactic 40 subq daily  ID: no antibiotics   [place] PA pager [phone]  01/09/2018

## 2018-01-09 NOTE — Consult Note (Signed)
Reason for Consult: L1 fracture Referring Physician: Trauma  Matthew Liu is an 77 y.o. male.  HPI: 77 year old male status post fall from the bottom of the ladder.  No loss of consciousness.  Patient reports immediate onset of lower back pain with some lower anterior costal border discomfort.  No numbness, paresthesias or weakness.  No history of hemodynamic instability.  Once again there was no loss of consciousness.  Patient reports no headache.  He has no neck pain.  He has no upper extremity symptoms.  Past Medical History:  Diagnosis Date  . Ankle fracture, left 2009   rod placed  . Anxiety   . Aspirin allergy    a. Anaphylactic rxn.  . Barrett's esophagus   . CAD (coronary artery disease)    a. Inf STEMI 07/2013: s/p DES x 2 from ostial to [facility] 07/13/13. Residual Cx disease for med rx (PCI if recurrent pain).  . Dyslipidemia   . ED (erectile dysfunction)   . GERD (gastroesophageal reflux disease)   . Hydrocele of testis   . Hyperglycemia    a. A1C 5.8 in 07/2013.  [name] Hypertension   . Myocardial infarction [facility])     Past Surgical History:  Procedure Laterality Date  . cataract surgery      bilateral   . CORONARY ANGIOPLASTY     stents   . HYDROCELE EXCISION Left 07/17/2014   Procedure: LEFT HYDROCELECTOMY;  Surgeon: Malka So, MD;  Location: [facility];  Service: Urology;  Laterality: Left;  . left ankle surgery  1999  . LEFT HEART CATHETERIZATION WITH CORONARY ANGIOGRAM N/A 07/13/2013   Procedure: LEFT HEART CATHETERIZATION WITH CORONARY ANGIOGRAM;  Surgeon: Sinclair Grooms, MD;  Location: [facility] CATH LAB;  Service: Cardiovascular;  Laterality: N/A;  . PERCUTANEOUS CORONARY STENT INTERVENTION (PCI-S)  07/13/2013   Procedure: PERCUTANEOUS CORONARY STENT INTERVENTION (PCI-S);  Surgeon: Sinclair Grooms, MD;  Location: [facility] CATH LAB;  Service: Cardiovascular;;    Family History  Problem Relation Age of Onset  . Hypertension Mother   . Stroke Neg Hx     Social History:   reports that he has never smoked. He has never used smokeless tobacco. He reports that he drinks alcohol. He reports that he does not use drugs.  Allergies:  Allergies  Allergen Reactions  . Aspirin Hives and Shortness Of Breath    "Shortness of Breath" can't breathe  . Quinine Derivatives Hives    "looks like he has the measles"    Medications: I have reviewed the patient's current medications.  No results found for this or any previous visit (from the past 48 hour(s)).  [name]  Result Date: 01/09/2018 CLINICAL DATA:  [place] from ladder last night. No loss of consciousness. EXAM: MRI CERVICAL SPINE WITHOUT CONTRAST TECHNIQUE: Multiplanar, multisequence MR imaging of the cervical spine was performed. No intravenous contrast was administered. COMPARISON:  CT cervical spine January 08, 2018 FINDINGS: ALIGNMENT: Straightened cervical lordosis.  No malalignment. VERTEBRAE/DISCS: Mild old C7 superior endplate compression fracture. Minimal chronic wedging T1. Focal low T1 and bright STIR signal C6 spinous process. 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 C3-4 interbody arthrodesis. Disc desiccation C2-3 disc. CORD:Cervical spinal cord is normal morphology and signal characteristics from the cervicomedullary junction to level of T2-3, the most caudal well visualized level. POSTERIOR FOSSA, VERTEBRAL ARTERIES, PARASPINAL TISSUES: RIGHT C5-6 interspinous STIR signal. Vertebral artery flow voids present. Patchy T2 bright  signal pons most compatible with chronic small vessel ischemic disease. Old RIGHT cerebellar small infarct. DISC LEVELS: C2-3: Small broad-based disc bulge asymmetric to the RIGHT, uncovertebral hypertrophy. Mild LEFT facet arthropathy. No canal stenosis without neural foraminal narrowing. C3-4: Interbody fusion. Uncovertebral hypertrophy. No canal stenosis. Mild RIGHT, mild to moderate LEFT neural foraminal  narrowing. C4-5: Small broad-based disc bulge, uncovertebral hypertrophy. Mild RIGHT, moderate LEFT facet arthropathy. Mild canal stenosis. Severe RIGHT and moderate to severe LEFT neural foraminal narrowing. C5-6: Small broad-based disc bulge, uncovertebral hypertrophy and mild facet arthropathy. Mild canal stenosis. Severe bilateral neural foraminal narrowing. C6-7, C7-T1: No disc bulge, canal stenosis nor neural foraminal narrowing. IMPRESSION: 1. C6 spinous process bone marrow edema, possible fracture. C5-6 interspinous ligament strain. 2. Old mild C7 compression fracture and minimal T1 chronic wedging. 3. Mild canal stenosis C4-5 and C5-6. Neural foraminal narrowing C3-4 through C5-6: Severe at C4-5 and C5-6. Electronically Signed   By: Elon Alas M.D.   On: 01/09/2018 02:04    The patient is awake and alert.  He is oriented and appropriate.  His speech is fluent.  His judgment and insight are intact.  Examination of his cranial nerve function finds normal vision, visual fields, extraocular movements, facial movement, facial sensation, tongue movement, palate movement, and shoulder shrugs bilaterally.  Motor 5/5 bilaterally.  No evidence of pronator drift.  Sensory examination nonfocal.  Reflexes hypoactive but symmetric.  No evidence of long track signs.  Examination head ears eyes nose throat is unremarkable.  Chest is benign.  Abdomen is soft.  Examination of the neck reveals no evidence of tenderness.  Range of motion is full without pain.  Airway normal.  Carotid pulses normal bilaterally.  Thoracic spine nontender.  Upper lumbar spine minimally tender. Blood pressure 121/68, pulse 84, temperature 98.6 F (37 C), temperature source Oral, resp. rate 16, height 5\' 9"  (1.753 m), weight 74.8 kg (165 lb), SpO2 95 %.   Assessment/Plan: The patient has some significant degenerative change with some high signal between his interspinous ligaments posteriorly in his cervical spine which I think is  all degenerative in nature and does not represent any form of acute injury.  I think the patient may be mobilized without a cervical orthosis and does not require any additional workup for this.  The patient does have evidence of a mild L1 superior endplate compression fracture.  I these would be best treated with a lumbar corset with a thoracic extension.  Patient should be mobilized with therapy.  Once pain control is adequate and mobility is adequate patient may be discharged home.  He should follow-up with me in 2-3 weeks.  Mallie Mussel A Erving Sassano 01/09/2018, 10:24 AM

## 2018-01-10 MED ORDER — ENSURE ENLIVE PO LIQD
1.0000 | Freq: Two times a day (BID) | ORAL | Status: DC
  Administered 2018-01-10 – 2018-01-12 (×4): 237 mL via ORAL

## 2018-01-10 MED ORDER — POLYETHYLENE GLYCOL 3350 17 G PO PACK
17.0000 g | PACK | Freq: Every day | ORAL | Status: DC
  Administered 2018-01-10: 17 g via ORAL

## 2018-01-10 MED ORDER — BISACODYL 10 MG RE SUPP
10.0000 mg | Freq: Once | RECTAL | Status: AC
  Administered 2018-01-10: 10 mg via RECTAL
  Filled 2018-01-10: qty 1

## 2018-01-10 MED ORDER — PANTOPRAZOLE SODIUM 20 MG PO TBEC
20.0000 mg | DELAYED_RELEASE_TABLET | Freq: Every day | ORAL | Status: DC
Start: 2018-01-11 — End: 2018-01-12
  Administered 2018-01-11 – 2018-01-12 (×2): 20 mg via ORAL
  Filled 2018-01-10 (×2): qty 1

## 2018-01-10 NOTE — Progress Notes (Signed)
Patient c/o pressure in abdomen and feeling the need to have a BM. Suppository and Miralax given to manage current symptoms.

## 2018-01-10 NOTE — Care Management Obs Status (Signed)
[facility] NOTIFICATION   Patient Details  Name: Matthew Liu MRN: [id number] Date of Birth: 1941-02-02   Medicare Observation Status Notification Given:  Yes    Ella Bodo, RN 01/10/2018, 9:57 AM

## 2018-01-10 NOTE — Progress Notes (Signed)
LOS: 0 days   Subjective: [name] is a 77yo M HD#2 for a fall resulting in an L1 Fracture. Yesterday patient was fitted for a vista lumbar binder to brace his spine. He was seen by both PT and OT. Today he complains that he has not recived his moring protonix yet and that he has abdominal distention. He reports no flatus, no BM for 2 days. He has some pain to his left abdomen and low back. He is able to ambulate with assistance, is producing good urine volume, is tolerating solid foods. VSS  Objective: Vital signs in last 24 hours: Temp:  [97.5 F (36.4 C)-99 F (37.2 C)] 97.5 F (36.4 C) (04/03 0420) Pulse Rate:  [63-94] 67 (04/03 0420) Resp:  [16-20] 20 (04/03 0420) BP: (125-178)/(55-85) 132/85 (04/03 0420) SpO2:  [96 %-99 %] 96 % (04/03 0420)     Laboratory  CBC Recent Labs    01/09/18 1243  WBC 5.7  HGB 13.4  HCT 38.9*  PLT 158   BMET Recent Labs    01/09/18 1243  NA 135  K 4.1  CL 101  CO2 24  GLUCOSE 171*  BUN 20  CREATININE 1.03  CALCIUM 8.8*     Physical Exam General appearance: alert and cooperative Chest wall: no tenderness, left sided chest wall tenderness, left sided costochondral tenderness GI: abnormal findings:  absent bowel sounds, distended and mild tenderness to light palpation, more tenderness to LUQ  Lymph nodes: Cervical, supraclavicular, and axillary nodes normal. and nonedematous extremities  Neurologic: Grossly normal   Assessment/Plan:  [name] is a 77yo M on HD#2 for fractured L1 endplate following a fall  L1 fractured endplate: he has been fitted for vista lumbar corset with thoracic extension and instructed to use when ambulating. PT recommends outpatient PT when cleared by Neurosurgery.  F/u with Dr. Annette Stable in 2-3 weeks.   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    HTN: stable, continue home medications   History of MI: stable, no chest pain,  continue to monitor for symptoms  History of CVA: stable, no symptoms, continue to monitor   Dispo: Discharge to home today.    [facility] PA pager [phone]  01/10/2018

## 2018-01-10 NOTE — Progress Notes (Signed)
Physical Therapy Treatment Patient Details Name: Matthew Liu MRN: [id number] DOB: 1940-11-15 Today's Date: 01/10/2018    History of Present Illness Pt is a 77 y/o male admitted following fall from ladder. Found to have mild L1 superior endplate compression fx. MRI cervical spine-C6 spinous process bone marrow edema, possible fracture. PMH includes CAD s/p stent placement, MI, HTN, and L ankle fx.     PT Comments    Patient reporting increased pain in abdomen today with some distention noted. Reports he has not had a BM in a few days. Also with new TLSO for mobility- reviewed how to donn with wife and patient. Tolerated gait training and stair training with Min guard assist for safety. Pt not interested in using RW. Reviewed back precautions esp minimizing flexion and bending. Pt supposed to d/c home today with support of wife. Will follow.    Follow Up Recommendations  Supervision for mobility/OOB;Outpatient PT(once cleared by MD)     Equipment Recommendations  None recommended by PT    Recommendations for Other Services       Precautions / Restrictions Precautions Precautions: Back Precaution Booklet Issued: Yes (comment) Precaution Comments: Reviewed back precautions with pt.  Required Braces or Orthoses: Spinal Brace Spinal Brace: Thoracolumbosacral orthotic;Applied in sitting position Restrictions Weight Bearing Restrictions: No    Mobility  Bed Mobility Overal bed mobility: Needs Assistance Bed Mobility: Rolling;Sidelying to Sit Rolling: Supervision Sidelying to sit: Supervision       General bed mobility comments: HOB flat, no use of rails to simulate home. Good demo of log roll technique with pain.  Transfers Overall transfer level: Needs assistance Equipment used: None Transfers: Sit to/from Stand Sit to Stand: Min guard         General transfer comment: Min guard for safety. Demonstrated safe hand placement. Increased time required secondary to pain.  Stood from [employer] x2, transferred to straight back chair post ambulation.  Ambulation/Gait Ambulation/Gait assistance: Min guard Ambulation Distance (Feet): 200 Feet Assistive device: None Gait Pattern/deviations: Step-through pattern;Decreased stride length;Trunk flexed Gait velocity: Decreased    General Gait Details: Declined using RW today, slow mildly unsteady gait with cues for upright posture. Some drifting noted but no overt LOB.   Stairs Stairs: Yes   Stair Management: Two rails;Alternating pattern;Step to pattern;Forwards Number of Stairs: 3(+ 2 steps x2 bouts) General stair comments: Alternating pattern for ascending and step to pattern for descending. Verbal cues for sequencing and assist required at home.   Wheelchair Mobility    Modified Rankin (Stroke Patients Only)       Balance Overall balance assessment: Needs assistance Sitting-balance support: Feet supported;No upper extremity supported Sitting balance-Leahy Scale: Good Sitting balance - Comments: Assist to donn TLSO and instructed wife   Standing balance support: During functional activity Standing balance-Leahy Scale: Fair Standing balance comment: no UE support in standing today.                            Cognition Arousal/Alertness: Awake/alert Behavior During Therapy: WFL for tasks assessed/performed Overall Cognitive Status: Within Functional Limits for tasks assessed                                        Exercises      General Comments General comments (skin integrity, edema, etc.): Wife present during session.      Pertinent Vitals/Pain  Pain Assessment: Faces Faces Pain Scale: Hurts even more Pain Location: back and abdomen Pain Descriptors / Indicators: Aching;Discomfort;Grimacing;Guarding Pain Intervention(s): Monitored during session;Repositioned;Limited activity within patient's tolerance    Home Living                      Prior Function             PT Goals (current goals can now be found in the care plan section) Progress towards PT goals: Progressing toward goals    Frequency    Min 3X/week      PT Plan Current plan remains appropriate    Co-evaluation              AM-PAC PT "6 Clicks" Daily Activity  Outcome Measure  Difficulty turning over in bed (including adjusting bedclothes, sheets and blankets)?: None Difficulty moving from lying on back to sitting on the side of the bed? : None Difficulty sitting down on and standing up from a chair with arms (e.g., wheelchair, bedside commode, etc,.)?: None Help needed moving to and from a bed to chair (including a wheelchair)?: A Little Help needed walking in hospital room?: A Little Help needed climbing 3-5 steps with a railing? : A Little 6 Click Score: 21    End of Session Equipment Utilized During Treatment: Gait belt;Back brace Activity Tolerance: Patient limited by pain Patient left: in chair;with call bell/phone within reach;with family/visitor present;Other (comment)(CM present in room) Nurse Communication: Mobility status PT Visit Diagnosis: Other abnormalities of gait and mobility (R26.89);Pain Pain - part of body: (back, abdomen)     Time: 3536-1443 PT Time Calculation (min) (ACUTE ONLY): 16 min  Charges:  $Gait Training: 8-22 mins                    G Codes:       Wray Kearns, PT, DPT [phone]     Marguarite Arbour A Minoru Chap 01/10/2018, 10:03 AM

## 2018-01-10 NOTE — Discharge Instructions (Addendum)
Patient is to wear brace when ambulating, patient can remove the brace when in bed, sitting, or in shower. Brace does not need to be worn when when ambulating to the bathroom.    Lumbar Fracture A lumbar fracture is a break in one of the bones of the lower back. Lumbar fractures range in severity. Severe fractures can damage the spinal cord. What are the causes? This condition may be caused by:  A fall (common).  A car accident (common).  A gunshot wound.  A hard, direct hit to the back.  Osteoporosis.  What are the signs or symptoms? The main symptom of this condition is severe pain in the lower back. If a fracture is complex or severe, there may also be:  A misshapen or swollen area on the lower back.  A limited ability to move an area of the lower back.  An inability to empty the bladder or bowel.  A loss of strength or sensation in the legs, feet, and toes.  Paralysis.  How is this diagnosed? This condition is diagnosed based on:  A physical exam.  Symptoms and what happened just before they developed.  The results of imaging tests, such as an X-ray, CT scan, or MRI.  If your nerves have been damaged, you may also have other tests to find out how much damage there is. How is this treated? Treatment for this condition depends on the specifics of the injury. Most fractures can be treated with:  A back brace.  Bed rest and activity restrictions.  Pain medicine.  Physical therapy.  Fractures that are complex, involve multiple bones, or make the spine unstable may require surgery to remove pressure from the nerves or spinal cord and to stabilize the broken pieces of bone. During recovery, it is normal to have pain and stiffness in the back for weeks. Follow these instructions at home: Medicines  Take medicines only as directed by your health care provider.  Do not drive or operate heavy machinery while taking pain medicine. Activity  Stay in bed for as  long as directed by your health care provider.  If you were shown how to do any exercises to improve motion and strength in your back, do them as directed by your health care provider.  Return to your normal activities as directed by your health care provider. Ask your health care provider what activities are safe for you. General instructions  If you were given a neck brace or back brace, wear it as directed by your health care provider.  Keep all follow-up visits as directed by your health care provider. This is important. Failure to follow-up as recommended could result in permanent injury, disability, and long-lasting (chronic) pain. Contact a health care provider if:  Your pain does not improve over time.  You have a persistent cough.  You cannot return to your normal activities as planned or expected. Get help right away if:  You have severe pain or your pain suddenly gets worse.  You are unable to move.  You have numbness, tingling, weakness, or paralysis in any part of your body.  You cannot control your bladder or bowel.  You have difficulty breathing.  You have a fever.  You have pain in your chest or abdomen.  You vomit. This information is not intended to replace advice given to you by your health care provider. Make sure you discuss any questions you have with your health care provider. Document Released: 01/11/2007 Document Revised: 03/03/2016 Document  Reviewed: 09/22/2014 Elsevier Interactive Patient Education  [employer].

## 2018-01-10 NOTE — Progress Notes (Signed)
Occupational Therapy Treatment Patient Details Name: Matthew Liu MRN: [id number] DOB: October 20, 1940 Today's Date: 01/10/2018    History of present illness Pt is a 77 y/o male admitted following fall from ladder. Found to have mild L1 superior endplate compression fx. MRI cervical spine-C6 spinous process bone marrow edema, possible fracture. PMH includes CAD s/p stent placement, MI, HTN, and L ankle fx.    OT comments  Pt progressing towards goals; completing room and hallway level functional mobility this session without AD and with minA-MinGuard. Pt completing tub transfer with MinA and min verbal cues for safety/technique. Reviewed AE for completing LB ADLs with pt verbalizing and return demonstrating understanding with min verbal cues. POC remains appropriate. Will continue to follow acutely to progress pt towards established OT goals.   Follow Up Recommendations  No OT follow up;Supervision - Intermittent    Equipment Recommendations  None recommended by OT           Precautions / Restrictions Precautions Precautions: Back Precaution Booklet Issued: Yes (comment) Precaution Comments: Reviewed back precautions with pt; pt verbalizing 3/3 back precautions  Required Braces or Orthoses: Spinal Brace Spinal Brace: Thoracolumbosacral orthotic;Applied in sitting position Restrictions Weight Bearing Restrictions: No       Mobility Bed Mobility Overal bed mobility: Needs Assistance Bed Mobility: Rolling;Sidelying to Sit;Sit to Sidelying Rolling: Supervision Sidelying to sit: Supervision     Sit to sidelying: Supervision General bed mobility comments: pt demonstrates good use of log roll technique   Transfers Overall transfer level: Needs assistance Equipment used: None Transfers: Sit to/from Stand Sit to Stand: Min guard         General transfer comment: MinGuard for safety; stood from EOB and shower chair during session, demonstrates safe hand placement     Balance  Overall balance assessment: Needs assistance Sitting-balance support: Feet supported;No upper extremity supported Sitting balance-Leahy Scale: Good     Standing balance support: During functional activity Standing balance-Leahy Scale: Fair Standing balance comment: no UE support in standing today.                           ADL either performed or assessed with clinical judgement   ADL Overall ADL's : Needs assistance/impaired                                 Tub/ Shower Transfer: Tub transfer;Minimal assistance;Ambulation;Shower Scientist, research (medical) Details (indicate cue type and reason): min steadying assist, verbal cues for safety, technique; recommend pt have spouse provide superivison initially after return home for increased safety with pt/pt spouse verbalizing understanding    General ADL Comments: reviewed AE for completing LB and toileting ADLs with pt verbalizing understanding, return demonstrating use of reacher and sock aide with min verbal cues; reviewed donning/doffing brace with pt/pt spouse                         Cognition Arousal/Alertness: Awake/alert Behavior During Therapy: WFL for tasks assessed/performed Overall Cognitive Status: Within Functional Limits for tasks assessed                                                             Pertinent  Vitals/ Pain       Pain Assessment: Faces Faces Pain Scale: Hurts a little bit Pain Location: back and abdomen Pain Descriptors / Indicators: Guarding;Discomfort Pain Intervention(s): Monitored during session;Limited activity within patient's tolerance                                                           Frequency  Min 3X/week        Progress Toward Goals  OT Goals(current goals can now be found in the care plan section)  Progress towards OT goals: Progressing toward goals  Acute Rehab OT Goals Patient Stated Goal:  to go home  OT Goal Formulation: With patient Time For Goal Achievement: 01/23/18 Potential to Achieve Goals: Good  Plan Discharge plan remains appropriate                      AM-PAC PT "6 Clicks" Daily Activity     Outcome Measure   Help from another person eating meals?: None Help from another person taking care of personal grooming?: A Little Help from another person toileting, which includes using toliet, bedpan, or urinal?: A Little Help from another person bathing (including washing, rinsing, drying)?: A Little Help from another person to put on and taking off regular upper body clothing?: None Help from another person to put on and taking off regular lower body clothing?: A Little 6 Click Score: 20    End of Session Equipment Utilized During Treatment: Gait belt;Back brace  OT Visit Diagnosis: Unsteadiness on feet (R26.81);History of falling (Z91.81);Pain Pain - part of body: (back )   Activity Tolerance Patient tolerated treatment well   Patient Left in bed;with call bell/phone within reach;with family/visitor present   Nurse Communication Mobility status        Time: 6314-9702 OT Time Calculation (min): 30 min  Charges: OT General Charges $OT Visit: 1 Visit OT Treatments $Self Care/Home Management : 23-37 mins  Lou Cal, OT Pager [phone] 01/10/2018   Raymondo Band 01/10/2018, 4:26 PM

## 2018-01-11 DIAGNOSIS — R402253 Coma scale, best verbal response, oriented, at hospital admission: Secondary | ICD-10-CM | POA: Diagnosis not present

## 2018-01-11 DIAGNOSIS — R402143 Coma scale, eyes open, spontaneous, at hospital admission: Secondary | ICD-10-CM | POA: Diagnosis not present

## 2018-01-11 DIAGNOSIS — M545 Low back pain: Secondary | ICD-10-CM | POA: Diagnosis present

## 2018-01-11 DIAGNOSIS — Z955 Presence of coronary angioplasty implant and graft: Secondary | ICD-10-CM | POA: Diagnosis not present

## 2018-01-11 DIAGNOSIS — S12600A Unspecified displaced fracture of seventh cervical vertebra, initial encounter for closed fracture: Secondary | ICD-10-CM | POA: Diagnosis not present

## 2018-01-11 DIAGNOSIS — W11XXXA Fall on and from ladder, initial encounter: Secondary | ICD-10-CM | POA: Diagnosis not present

## 2018-01-11 DIAGNOSIS — K219 Gastro-esophageal reflux disease without esophagitis: Secondary | ICD-10-CM | POA: Diagnosis not present

## 2018-01-11 DIAGNOSIS — K567 Ileus, unspecified: Secondary | ICD-10-CM | POA: Diagnosis not present

## 2018-01-11 DIAGNOSIS — I10 Essential (primary) hypertension: Secondary | ICD-10-CM | POA: Diagnosis not present

## 2018-01-11 DIAGNOSIS — I2510 Atherosclerotic heart disease of native coronary artery without angina pectoris: Secondary | ICD-10-CM | POA: Diagnosis not present

## 2018-01-11 DIAGNOSIS — R402363 Coma scale, best motor response, obeys commands, at hospital admission: Secondary | ICD-10-CM | POA: Diagnosis not present

## 2018-01-11 DIAGNOSIS — I252 Old myocardial infarction: Secondary | ICD-10-CM | POA: Diagnosis not present

## 2018-01-11 DIAGNOSIS — S22089A Unspecified fracture of T11-T12 vertebra, initial encounter for closed fracture: Secondary | ICD-10-CM | POA: Diagnosis not present

## 2018-01-11 DIAGNOSIS — E785 Hyperlipidemia, unspecified: Secondary | ICD-10-CM | POA: Diagnosis not present

## 2018-01-11 DIAGNOSIS — Z8673 Personal history of transient ischemic attack (TIA), and cerebral infarction without residual deficits: Secondary | ICD-10-CM | POA: Diagnosis not present

## 2018-01-11 DIAGNOSIS — S32018A Other fracture of first lumbar vertebra, initial encounter for closed fracture: Secondary | ICD-10-CM | POA: Diagnosis not present

## 2018-01-11 DIAGNOSIS — M5030 Other cervical disc degeneration, unspecified cervical region: Secondary | ICD-10-CM | POA: Diagnosis not present

## 2018-01-11 DIAGNOSIS — Z7902 Long term (current) use of antithrombotics/antiplatelets: Secondary | ICD-10-CM | POA: Diagnosis not present

## 2018-01-11 DIAGNOSIS — Y92007 Garden or yard of unspecified non-institutional (private) residence as the place of occurrence of the external cause: Secondary | ICD-10-CM | POA: Diagnosis not present

## 2018-01-11 DIAGNOSIS — Y93H2 Activity, gardening and landscaping: Secondary | ICD-10-CM | POA: Diagnosis not present

## 2018-01-11 DIAGNOSIS — Z8249 Family history of ischemic heart disease and other diseases of the circulatory system: Secondary | ICD-10-CM | POA: Diagnosis not present

## 2018-01-11 MED ORDER — CLOPIDOGREL BISULFATE 75 MG PO TABS
75.0000 mg | ORAL_TABLET | Freq: Every day | ORAL | Status: DC
  Administered 2018-01-11 – 2018-01-12 (×2): 75 mg via ORAL
  Filled 2018-01-11 (×2): qty 1

## 2018-01-11 MED ORDER — MAGNESIUM CITRATE PO SOLN
1.0000 | Freq: Every day | ORAL | Status: DC | PRN
  Administered 2018-01-11: 1 via ORAL
  Filled 2018-01-11: qty 296

## 2018-01-11 MED ORDER — POLYETHYLENE GLYCOL 3350 17 G PO PACK
17.0000 g | PACK | Freq: Two times a day (BID) | ORAL | Status: DC
  Administered 2018-01-11 (×2): 17 g via ORAL
  Filled 2018-01-11 (×3): qty 1

## 2018-01-11 MED ORDER — BISACODYL 10 MG RE SUPP
10.0000 mg | Freq: Once | RECTAL | Status: AC
  Administered 2018-01-11: 10 mg via RECTAL
  Filled 2018-01-11: qty 1

## 2018-01-11 NOTE — Progress Notes (Signed)
[facility] Progress Note     Subjective: CC: ileus Patient vomited overnight and did have a small BM after suppository but still not very hungry. Patient reports that his belly does feel softer and he is passing more gas this AM. Pain in back is well controlled, hurts mostly when getting up and down.   Objective: Vital signs in last 24 hours: Temp:  [97.5 F (36.4 C)-99.5 F (37.5 C)] 99.5 F (37.5 C) (04/04 0437) Pulse Rate:  [64-103] 103 (04/04 0437) Resp:  [16-19] 16 (04/04 0437) BP: (104-141)/(60-79) 120/60 (04/04 0437) SpO2:  [93 %-97 %] 96 % (04/04 0437) Last BM Date: 01/10/18  Intake/Output from previous day: 04/03 0701 - 04/04 0700 In: 120 [P.O.:120] Out: 500 [Urine:200; Emesis/NG output:300] Intake/Output this shift: No intake/output data recorded.  PE: Gen:  Alert, NAD, pleasant Card:  Regular rate and rhythm, pedal pulses 2+ BL Pulm:  Normal effort, clear to auscultation bilaterally Abd: Soft, non-tender, mildly distended, bowel sounds hypoactive, no HSM Neuro: mild TTP over lumbar spine, sensation/motor intact in bilateral LEs Skin: warm and dry, no rashes  Psych: A&Ox3   Lab Results:  Recent Labs    01/09/18 1243  WBC 5.7  HGB 13.4  HCT 38.9*  PLT 158   BMET Recent Labs    01/09/18 1243  NA 135  K 4.1  CL 101  CO2 24  GLUCOSE 171*  BUN 20  CREATININE 1.03  CALCIUM 8.8*   PT/INR No results for input(s): LABPROT, INR in the last 72 hours. CMP     Component Value Date/Time   NA 135 01/09/2018 1243   NA 141 03/20/2017 1331   K 4.1 01/09/2018 1243   CL 101 01/09/2018 1243   CO2 24 01/09/2018 1243   GLUCOSE 171 (H) 01/09/2018 1243   BUN 20 01/09/2018 1243   BUN 15 03/20/2017 1331   CREATININE 1.03 01/09/2018 1243   CALCIUM 8.8 (L) 01/09/2018 1243   PROT 6.6 03/20/2017 1331   ALBUMIN 4.3 03/20/2017 1331   AST 27 03/20/2017 1331   ALT 21 03/20/2017 1331   ALKPHOS 74 03/20/2017 1331   BILITOT 1.1 03/20/2017 1331   GFRNONAA >60 01/09/2018 1243   GFRAA >60 01/09/2018 1243   Lipase  No results found for: LIPASE   Studies/Results: No results found.  Anti-infectives: Anti-infectives (From admission, onward)   None       Assessment/Plan Fall from ladder L1 endplate fracture - brace, PT/OT, pain control  Ileus - vomited last night, but seems to be improving some; increase miralax to BID and repeat suppository - mobilize!!! HTN - home meds Hx of MI/CVA - restart plavix  FEN: HH diet VTE: SCDs, restart plavix ID: no abx indicated Follow up: Dr. Annette Stable  Dispo: increased miralax to BID, mobilize more, repeat suppository. Possibly home this afternoon or tomorrow AM  LOS: 0 days    Brigid Re , [facility] 01/11/2018, 7:23 AM Pager: [phone] Trauma Pager: [phone] Mon-Fri 7:00 am-4:30 pm Sat-Sun 7:00 am-11:30 am

## 2018-01-11 NOTE — Plan of Care (Signed)

## 2018-01-11 NOTE — Progress Notes (Signed)
Physical Therapy Treatment Patient Details Name: Matthew Liu MRN: [id number] DOB: 1941-07-13 Today's Date: 01/11/2018    History of Present Illness Pt is a 77 y/o male admitted following fall from ladder. Found to have mild L1 superior endplate compression fx. MRI cervical spine-C6 spinous process bone marrow edema, possible fracture. PMH includes CAD s/p stent placement, MI, HTN, and L ankle fx.     PT Comments    Pt very limited this session secondary to nausea. Pt's RN was notified. Pt's wife reported that she and pt have ambulated in hallway x2 today. Pt only agreeable to bed mobility at this time. PT will continue to follow acutely and progress mobility as tolerated.     Follow Up Recommendations  Supervision for mobility/OOB;Outpatient PT(once cleared by MD)     Equipment Recommendations  None recommended by PT    Recommendations for Other Services       Precautions / Restrictions Precautions Precautions: Back Precaution Comments: PT reviewed log roll technique with pt and pt with good demonstration Required Braces or Orthoses: Spinal Brace Spinal Brace: Thoracolumbosacral orthotic;Applied in sitting position Restrictions Weight Bearing Restrictions: No    Mobility  Bed Mobility Overal bed mobility: Needs Assistance Bed Mobility: Rolling;Sit to Sidelying Rolling: Supervision       Sit to sidelying: Supervision General bed mobility comments: pt demonstrates good use of log roll technique   Transfers                 General transfer comment: pt sitting EOB upon arrival  Ambulation/Gait             General Gait Details: declining at this time secondary to nausea; pt's wife reported that she has ambulated in hallway with pt x2 today   Stairs            Wheelchair Mobility    Modified Rankin (Stroke Patients Only)       Balance Overall balance assessment: Needs assistance Sitting-balance support: Feet supported;No upper extremity  supported Sitting balance-Leahy Scale: Good                                      Cognition Arousal/Alertness: Awake/alert Behavior During Therapy: WFL for tasks assessed/performed Overall Cognitive Status: Within Functional Limits for tasks assessed                                        Exercises      General Comments        Pertinent Vitals/Pain Pain Assessment: Faces Faces Pain Scale: Hurts a little bit Pain Location: back and abdomen Pain Descriptors / Indicators: Guarding;Discomfort Pain Intervention(s): Monitored during session;Repositioned    Home Living                      Prior Function            PT Goals (current goals can now be found in the care plan section) Acute Rehab PT Goals PT Goal Formulation: With patient/family Time For Goal Achievement: 01/23/18 Potential to Achieve Goals: Good Progress towards PT goals: Progressing toward goals    Frequency    Min 3X/week      PT Plan Current plan remains appropriate    Co-evaluation  AM-PAC PT "6 Clicks" Daily Activity  Outcome Measure  Difficulty turning over in bed (including adjusting bedclothes, sheets and blankets)?: None Difficulty moving from lying on back to sitting on the side of the bed? : None Difficulty sitting down on and standing up from a chair with arms (e.g., wheelchair, bedside commode, etc,.)?: A Little Help needed moving to and from a bed to chair (including a wheelchair)?: A Little Help needed walking in hospital room?: A Little Help needed climbing 3-5 steps with a railing? : A Little 6 Click Score: 20    End of Session   Activity Tolerance: Other (comment)(limited secondary to nausea) Patient left: in bed;with call bell/phone within reach;with family/visitor present Nurse Communication: Mobility status;Other (comment)(pt requesting nausea medication) PT Visit Diagnosis: Other abnormalities of gait and mobility  (R26.89)     Time: 1639-1700 PT Time Calculation (min) (ACUTE ONLY): 21 min  Charges:  $Therapeutic Activity: 8-22 mins                    G Codes:       [place], [place], [place] 01/11/2018, 5:12 PM

## 2018-01-12 DIAGNOSIS — M545 Low back pain: Secondary | ICD-10-CM | POA: Diagnosis not present

## 2018-01-12 DIAGNOSIS — S32018A Other fracture of first lumbar vertebra, initial encounter for closed fracture: Secondary | ICD-10-CM | POA: Diagnosis not present

## 2018-01-12 LAB — CBC
HCT: 37.7 % — ABNORMAL LOW (ref 39.0–52.0)
Hemoglobin: 12.9 g/dL — ABNORMAL LOW (ref 13.0–17.0)
MCH: 33.3 pg (ref 26.0–34.0)
MCHC: 34.2 g/dL (ref 30.0–36.0)
MCV: 97.4 fL (ref 78.0–100.0)
PLATELETS: 158 10*3/uL (ref 150–400)
RBC: 3.87 MIL/uL — AB (ref 4.22–5.81)
RDW: 12.9 % (ref 11.5–15.5)
WBC: 8.8 10*3/uL (ref 4.0–10.5)

## 2018-01-12 LAB — BASIC METABOLIC PANEL
Anion gap: 13 (ref 5–15)
BUN: 34 mg/dL — AB (ref 6–20)
CO2: 27 mmol/L (ref 22–32)
CREATININE: 1.29 mg/dL — AB (ref 0.61–1.24)
Calcium: 8.6 mg/dL — ABNORMAL LOW (ref 8.9–10.3)
Chloride: 90 mmol/L — ABNORMAL LOW (ref 101–111)
GFR calc Af Amer: >60 mL/min (ref >60)
GFR, EST NON AFRICAN AMERICAN: 52 mL/min — AB (ref >60)
Glucose, Bld: 113 mg/dL — ABNORMAL HIGH (ref 65–99)
POTASSIUM: 3.9 mmol/L (ref 3.5–5.1)
SODIUM: 130 mmol/L — AB (ref 135–145)

## 2018-01-12 MED ORDER — HYDROCODONE-ACETAMINOPHEN 5-325 MG PO TABS: 1.0000 | ORAL_TABLET | Freq: Four times a day (QID) | ORAL | 0 refills | Status: DC | PRN

## 2018-01-12 MED ORDER — DOCUSATE SODIUM 100 MG PO CAPS
100.0000 mg | ORAL_CAPSULE | Freq: Two times a day (BID) | ORAL | 0 refills | Status: DC
Start: 2018-01-12 — End: 2018-05-02

## 2018-01-12 MED ORDER — ACETAMINOPHEN 500 MG PO TABS: 500.0000 mg | ORAL_TABLET | Freq: Four times a day (QID) | ORAL | 0 refills | Status: DC | PRN

## 2018-01-12 NOTE — Progress Notes (Signed)
Physical Therapy Treatment Patient Details Name: Matthew Liu MRN: [id number] DOB: 1941-08-13 Today's Date: 01/12/2018    History of Present Illness Pt is a 77 y/o male admitted following fall from ladder. Found to have mild L1 superior endplate compression fx. MRI cervical spine-C6 spinous process bone marrow edema, possible fracture. PMH includes CAD s/p stent placement, MI, HTN, and L ankle fx.     PT Comments    Pt reports he is still experiencing some nausea and diarrhea, but willing to work with PT. Pt assisted to restroom for toileting and required min A for peri care.  He ambulated 200 ft in hall w/o AD and min guard for safety. Pt with good adherence to spinal precautions throughout session. Would benefit from continued skilled PT after d/c to maximize functional independence and safety with mobility.    Follow Up Recommendations  Supervision for mobility/OOB;Outpatient PT(once cleared by MD)     Equipment Recommendations  None recommended by PT    Recommendations for Other Services       Precautions / Restrictions Precautions Precautions: Back Precaution Booklet Issued: Yes (comment) Precaution Comments: pt unable to repete back precautions, however demonstrated adhearence throughout session. Required Braces or Orthoses: Spinal Brace Spinal Brace: Thoracolumbosacral orthotic Restrictions Weight Bearing Restrictions: No    Mobility  Bed Mobility Overal bed mobility: Needs Assistance Bed Mobility: Rolling;Sit to Sidelying Rolling: Supervision Sidelying to sit: Min assist     Sit to sidelying: Supervision General bed mobility comments: Good technique, use of hand rail. Pt required min A to elevate trunk secondary to increased pain.  Transfers Overall transfer level: Needs assistance Equipment used: Rolling walker (2 wheeled) Transfers: Sit to/from Stand Sit to Stand: Supervision         General transfer comment: supervison for  safety  Ambulation/Gait Ambulation/Gait assistance: Min guard Ambulation Distance (Feet): 200 Feet Assistive device: None Gait Pattern/deviations: Step-through pattern;Decreased stride length Gait velocity: Decreased  Gait velocity interpretation: Below normal speed for age/gender General Gait Details: Slow steady gait w/o AD. min guard for safety.    Stairs            Wheelchair Mobility    Modified Rankin (Stroke Patients Only)       Balance Overall balance assessment: Needs assistance Sitting-balance support: Feet supported;No upper extremity supported Sitting balance-Leahy Scale: Good Sitting balance - Comments: Assist to donn TLSO   Standing balance support: During functional activity Standing balance-Leahy Scale: Fair Standing balance comment: no UE support in standing today.                            Cognition Arousal/Alertness: Awake/alert Behavior During Therapy: WFL for tasks assessed/performed Overall Cognitive Status: Within Functional Limits for tasks assessed(Pt demonstrates STM deficits adn impulsivity)                                 General Comments: most likely baseline cognition      Exercises      General Comments General comments (skin integrity, edema, etc.): Pt assisted to restroom and required min A for peri care.      Pertinent Vitals/Pain Pain Assessment: Faces Faces Pain Scale: Hurts little more Pain Location: back and abdomen with movement Pain Descriptors / Indicators: Guarding;Discomfort Pain Intervention(s): Monitored during session;Limited activity within patient's tolerance    Home Living  Prior Function            PT Goals (current goals can now be found in the care plan section) Acute Rehab PT Goals Patient Stated Goal: to go home  PT Goal Formulation: With patient/family Time For Goal Achievement: 01/23/18 Potential to Achieve Goals: Good Progress towards  PT goals: Progressing toward goals    Frequency    Min 3X/week      PT Plan Current plan remains appropriate    Co-evaluation              AM-PAC PT "6 Clicks" Daily Activity  Outcome Measure  Difficulty turning over in bed (including adjusting bedclothes, sheets and blankets)?: None Difficulty moving from lying on back to sitting on the side of the bed? : None Difficulty sitting down on and standing up from a chair with arms (e.g., wheelchair, bedside commode, etc,.)?: A Little Help needed moving to and from a bed to chair (including a wheelchair)?: A Little Help needed walking in hospital room?: A Little Help needed climbing 3-5 steps with a railing? : A Little 6 Click Score: 20    End of Session Equipment Utilized During Treatment: Gait belt;Back brace Activity Tolerance: Patient tolerated treatment well Patient left: in bed;with call bell/phone within reach;with family/visitor present Nurse Communication: Mobility status;Other (comment)(pt requesting nausea medication) PT Visit Diagnosis: Other abnormalities of gait and mobility (R26.89) Pain - part of body: (back, abdomen)     Time: 2355-7322 PT Time Calculation (min) (ACUTE ONLY): 14 min  Charges:  $Gait Training: 8-22 mins                    G Codes:       Benjiman Core, [place] Pager [phone] Acute Rehab   Allena Katz 01/12/2018, 12:51 PM

## 2018-01-12 NOTE — Progress Notes (Signed)
ICS given to Pt. Level reach was 1500.

## 2018-01-12 NOTE — Discharge Summary (Signed)
Physician Discharge Summary  Patient ID: Matthew Liu MRN: [id number] DOB/AGE: [birth year] 78 y.o.  Admit date: 01/08/2018 Discharge date: 01/12/2018  Discharge Diagnoses Fall L1 compression fracture Ileus - resolved   Consultants Neurosurgery  Procedures None   HPI: 77 year old male was trimming his hedges on a small ladder. He went to get down but missed a step and fell. He estimated being 18 in from the ground when stepping down. His back hit first and then his head. He did not lose consciousness. He complained of pain in his back when moving between beds. Workup in the ED revealed above listed injuries. Patient was admitted to the trauma service.   Hospital Course: Neurosurgery consulted and recommended non-operative management with a brace. Diet was advanced as tolerated and patient mobilized with PT/OT who recommended outpatient PT. Patient developed a little bit of an ileus which resolved with mobilization and bowel regimen. On 01/12/18 patient was tolerating diet, voiding appropriately, VSS, and overall felt stable for discharge home. He will follow up as below and knows to call with questions or concerns.   Physical Exam: Gen:  Alert, NAD, pleasant Card:  Regular rate and rhythm, pedal pulses 2+ BL Pulm:  Normal effort, clear to auscultation bilaterally Abd: Soft, non-tender, non-distended, good bowel sounds, no HSM Neuro: mild TTP over lumbar spine, sensation/motor intact in bilateral LEs Skin: warm and dry, no rashes  Psych: A&Ox3   I have personally looked this patient up in the [place] Controlled Substance Database and reviewed their medications.  Allergies as of 01/12/2018      Reactions   Aspirin Hives, Shortness Of Breath   "Shortness of Breath" can't breathe   Quinine Derivatives Hives   "looks like he has the measles"      Medication List    TAKE these medications   acetaminophen 500 MG tablet Commonly known as:  TYLENOL Take 1 tablet (500 mg total) by mouth every 6  (six) hours as needed for mild pain or headache. What changed:    when to take this  reasons to take this   ALPRAZolam 0.25 MG tablet Commonly known as:  XANAX Take 0.25 mg by mouth 3 (three) times daily as needed for anxiety.   atorvastatin 80 MG tablet Commonly known as:  LIPITOR Take 40 mg by mouth daily.   clopidogrel 75 MG tablet Commonly known as:  PLAVIX TAKE 1 TABLET BY MOUTH EVERY DAY   docusate sodium 100 MG capsule Commonly known as:  COLACE Take 1 capsule (100 mg total) by mouth 2 (two) times daily.   hydrochlorothiazide 12.5 MG capsule Commonly known as:  MICROZIDE Take 1 capsule (12.5 mg total) by mouth daily.   HYDROcodone-acetaminophen 5-325 MG tablet Commonly known as:  NORCO/VICODIN Take 1 tablet by mouth every 6 (six) hours as needed for moderate pain.   lisinopril 40 MG tablet Commonly known as:  PRINIVIL,ZESTRIL Take 40 mg by mouth at bedtime.   metoprolol tartrate 25 MG tablet Commonly known as:  LOPRESSOR Take 0.5 tablets (12.5 mg total) by mouth daily. Please make yearly appt with Dr. Tamala Julian for May before anymore refills. 1st attempt What changed:    when to take this  additional instructions   nitroGLYCERIN 0.4 MG SL tablet Commonly known as:  NITROSTAT Place 1 tablet (0.4 mg total) under the tongue every 5 (five) minutes as needed for chest pain (up to 3 doses).   pantoprazole 20 MG tablet Commonly known as:  PROTONIX Take 20 mg by  mouth daily.   SENIOR MULTIVITAMIN PLUS PO Take 1 tablet by mouth every morning.        Follow-up Information    Earnie Larsson, MD. Schedule an appointment as soon as possible for a visit in 2 week(s).   Specialty:  Neurosurgery Why:  for follow up for L1 end plate fracture Contact information: [address] [address] [phone]        [facility] [place]. Call.   Why:  as needed with questions or concerns  Contact information: [address] [place] [address] [phone]          Signed: Brigid Re , [facility] 01/12/2018, 11:59 AM Pager: [phone] Trauma: [phone] Mon-Fri 7:00 am-4:30 pm Sat-Sun 7:00 am-11:30 am

## 2018-01-12 NOTE — Progress Notes (Signed)
Occupational Therapy Treatment Patient Details Name: GEOVANY TRUDO MRN: [id number] DOB: [birth year] Today's Date: 01/12/2018    History of present illness Pt is a 77 y/o male admitted following fall from ladder. Found to have mild L1 superior endplate compression fx. MRI cervical spine-C6 spinous process bone marrow edema, possible fracture. PMH includes CAD s/p stent placement, MI, HTN, and L ankle fx.    OT comments  Wife states "my hip is hurting from pulling on him". Pt overall S with all mobility and donning/doffing TLSO. Requires min A with LB ADL at times and wife able to assist. Educated wife on importance of proper handling techniques and given that her husband is S with mobility that there is not a need to physically pull on her husband. Wife demosntrated understanding. Pt safe to DC home when medically stable. OT signing off.   Follow Up Recommendations  No OT follow up;Supervision - Intermittent    Equipment Recommendations  None recommended by OT    Recommendations for Other Services      Precautions / Restrictions Precautions Precautions: Back Required Braces or Orthoses: Spinal Brace Spinal Brace: Thoracolumbosacral orthotic       Mobility Bed Mobility Overal bed mobility: Needs Assistance   Rolling: Supervision Sidelying to sit: Supervision     Sit to sidelying: Supervision General bed mobility comments: Continues to require vc for correct technique this am. Ablet o complete without use of rails.  Transfers Overall transfer level: Needs assistance Equipment used: Rolling walker (2 wheeled) Transfers: Sit to/from Stand Sit to Stand: Supervision              Balance     Sitting balance-Leahy Scale: Good       Standing balance-Leahy Scale: Fair                             ADL either performed or assessed with clinical judgement   ADL                                         General ADL Comments: Reviewed precautions  for ADL. Wife staes she will be helping him. Stressed importance of having someone with him when transferring into tub.Wife states she will be with him and they will use the showerseat. Again encouraged use of grab bars. Pt has a small dog and reviewed lifting restrictions adn safety regarding the dog. Pt/wife asking about driving - deferred to MD.     Vision       Perception     Praxis      Cognition Arousal/Alertness: Awake/alert Behavior During Therapy: WFL for tasks assessed/performed Overall Cognitive Status: Within Functional Limits for tasks assessed(Pt demonstrates STM deficits adn impulsivity)                                 General Comments: most likely baseline cognition        Exercises     Shoulder Instructions       General Comments      Pertinent Vitals/ Pain       Pain Assessment: Faces Faces Pain Scale: Hurts little more Pain Location: back and abdomen Pain Descriptors / Indicators: Guarding;Discomfort Pain Intervention(s): Limited activity within patient's tolerance  Home Living  Prior Functioning/Environment              Frequency           Progress Toward Goals  OT Goals(current goals can now be found in the care plan section)  Progress towards OT goals: Goals met/education completed, patient discharged from [facility] for DC)  Acute Rehab OT Goals Patient Stated Goal: to go home  OT Goal Formulation: With patient Time For Goal Achievement: 01/23/18 Potential to Achieve Goals: Good ADL Goals Pt Will Perform Lower Body Dressing: with modified independence;with adaptive equipment;sit to/from stand Pt Will Transfer to Toilet: with modified independence;ambulating;regular height toilet Pt Will Perform Tub/Shower Transfer: Tub transfer;with supervision;with caregiver independent in assisting;3 in 1;ambulating Additional ADL Goal #1: Pt will independently verbalize  3 back precautions Additional ADL Goal #2: Pt will independently verbalzie 3 fall prevention strategies  Plan Discharge plan remains appropriate    Co-evaluation                 AM-PAC PT "6 Clicks" Daily Activity     Outcome Measure   Help from another person eating meals?: None Help from another person taking care of personal grooming?: None Help from another person toileting, which includes using toliet, bedpan, or urinal?: A Little Help from another person bathing (including washing, rinsing, drying)?: A Little Help from another person to put on and taking off regular upper body clothing?: None Help from another person to put on and taking off regular lower body clothing?: A Little 6 Click Score: 21    End of Session Equipment Utilized During Treatment: Gait belt;Rolling walker;Back brace  OT Visit Diagnosis: Unsteadiness on feet (R26.81);History of falling (Z91.81);Pain Pain - part of body: (back)   Activity Tolerance Patient tolerated treatment well   Patient Left in bed;with call bell/phone within reach;with family/visitor present   Nurse Communication Mobility status        Time: 2426-8341 OT Time Calculation (min): 24 min  Charges: OT General Charges $OT Visit: 1 Visit OT Treatments $Self Care/Home Management : 23-37 mins  [facility], OT/L  [phone] 01/12/2018   Nilson Tabora,H[place]ARY 01/12/2018, 9:45 AM

## 2018-01-12 NOTE — Progress Notes (Signed)
Patient for discharge home accompanied by his wife. Currently waiting for his daughter to pick him up. Medications and discharge instructions explained to the patient. He verbalized understanding. Copies given to him. IV saline lock removed

## 2018-03-01 ENCOUNTER — Telehealth: Payer: Self-pay

## 2018-03-01 NOTE — Telephone Encounter (Signed)
   [facility] Pre-operative Risk Assessment    Request for surgical clearance:  1. What type of surgery is being performed? L1 Kyphoplasty   2. When is this surgery scheduled? TBD   3. What type of clearance is required (medical clearance vs. Pharmacy clearance to hold med vs. Both)? medical  4. Are there any medications that need to be held prior to surgery and how long?unknown   5. Practice name and name of physician performing surgery? [facility]/ Dr Earnie Larsson  6. What is your office phone [phone]    7.   What is your office fax [phone]  8.   Anesthesia type (None, local, MAC, general) ? general   Matthew Liu 03/01/2018, 2:28 PM  _________________________________________________________________   (provider comments below)

## 2018-03-02 ENCOUNTER — Telehealth (HOSPITAL_COMMUNITY): Payer: Self-pay

## 2018-03-02 ENCOUNTER — Encounter: Payer: Self-pay | Admitting: Physician Assistant

## 2018-03-02 ENCOUNTER — Encounter: Payer: Self-pay | Admitting: *Deleted

## 2018-03-02 ENCOUNTER — Ambulatory Visit (INDEPENDENT_AMBULATORY_CARE_PROVIDER_SITE_OTHER): Payer: Medicare Other | Admitting: Physician Assistant

## 2018-03-02 ENCOUNTER — Encounter (INDEPENDENT_AMBULATORY_CARE_PROVIDER_SITE_OTHER): Payer: Self-pay

## 2018-03-02 VITALS — BP 124/62 | HR 51 | Ht 69.0 in | Wt 158.0 lb

## 2018-03-02 DIAGNOSIS — I2510 Atherosclerotic heart disease of native coronary artery without angina pectoris: Secondary | ICD-10-CM

## 2018-03-02 DIAGNOSIS — E785 Hyperlipidemia, unspecified: Secondary | ICD-10-CM | POA: Diagnosis not present

## 2018-03-02 DIAGNOSIS — R0602 Shortness of breath: Secondary | ICD-10-CM

## 2018-03-02 DIAGNOSIS — I10 Essential (primary) hypertension: Secondary | ICD-10-CM

## 2018-03-02 DIAGNOSIS — Z01810 Encounter for preprocedural cardiovascular examination: Secondary | ICD-10-CM

## 2018-03-02 NOTE — Telephone Encounter (Signed)
Encounter complete. 

## 2018-03-02 NOTE — Telephone Encounter (Signed)
Patient seen 03/02/2018 for office visit. Nuclear stress test arranged. Will follow up after stress test results available. Richardson Dopp, PA-C    03/02/2018 1:03 PM

## 2018-03-02 NOTE — Progress Notes (Addendum)
Cardiology Office Note:    Date:  03/02/2018   ID:  HAZEN BRUMETT, DOB 07-13-1941, MRN [id number]  PCP:  Nicoletta Dress, MD  Cardiologist:  Sinclair Grooms, MD   Referring MD: Nicoletta Dress, MD   Chief Complaint  Patient presents with  . Surgical Clearance    History of Present Illness:    Matthew Liu is a 77 y.o. male with coronary artery disease status post inferior myocardial infarction in 2014 treated with stenting to the RCA, hypertension, hyperlipidemia, prior stroke.  At the time of his myocardial infarction in 2014, he had severe circumflex disease.  However, he has not had ischemia on nuclear stress testing and managed medically.  He is allergic to aspirin.  Last seen by Dr. Tamala Julian May 2018.  He was admitted in April 2019 with L1 compression fracture after sustaining a fall.  He is in need of kyphoplasty with Dr. Deri Fuelling.  Matthew Liu presents for surgical clearance.  He is here today with his wife.  His wife notes that he has not been that active, even before his fall.  He denies any chest discomfort.  He has not noticed any difficulty with breathing.  His wife has noted that he breathes heavier and she also describes what sounds like apnea.  He does have a history of snoring.  He denies syncope, PND.  He has had some left ankle swelling related to an old injury.  Prior CV studies:   The following studies were reviewed today:  Nuclear stress test 02/25/2016  Nuclear stress EF: 57%. The LV function is normal  There was no ST segment deviation noted during stress.  Defect 1: There is a medium defect of moderate severity present in the basal inferior and mid inferior location.  Findings consistent with prior Inferior wall myocardial infarction.  This is a low risk study.  Carotid US 09/15/2015 Summary: Findings consistent with 1-39% stenosis involving the right internal carotid artery and the left internal carotid artery.  Echo 09/15/2015 EF 55-60, trivial  AI, mild MR, mild LAE  Cardiac catheterization 07/13/2013 LM distal 30 LAD proximal 40-50 LCx ostial and proximal 50, mid 99, OM1 80 RCA ostial and proximal 100 EF 55 PCI: 3.5 x 20 mm Promus Premier DES to the ostial RCA  Past Medical History:  Diagnosis Date  . Ankle fracture, left 2009   rod placed  . Anxiety   . Aspirin allergy    a. Anaphylactic rxn.  . Barrett's esophagus   . CAD (coronary artery disease)    a. Inf STEMI 07/2013: s/p DES x 2 from ostial to [facility] 07/13/13. Residual Cx disease for med rx (PCI if recurrent pain).  . Dyslipidemia   . ED (erectile dysfunction)   . GERD (gastroesophageal reflux disease)   . Hydrocele of testis   . Hyperglycemia    a. A1C 5.8 in 07/2013.  [name] Hypertension   . Myocardial infarction [facility])    Surgical Hx: The patient  has a past surgical history that includes left ankle surgery (1999); Coronary angioplasty; cataract surgery ; Hydrocele surgery (Left, 07/17/2014); left heart catheterization with coronary angiogram (N/A, 07/13/2013); and percutaneous coronary stent intervention (pci-s) (07/13/2013).   Current Medications: Current Meds  Medication Sig  . acetaminophen (TYLENOL) 500 MG tablet Take 1 tablet (500 mg total) by mouth every 6 (six) hours as needed for mild pain or headache.  . ALPRAZolam (XANAX) 0.25 MG tablet Take 0.25 mg by mouth 3 (three) times  daily as needed for anxiety.   [name] atorvastatin (LIPITOR) 80 MG tablet Take 40 mg by mouth daily.  . clopidogrel (PLAVIX) 75 MG tablet TAKE 1 TABLET BY MOUTH EVERY DAY  . docusate sodium (COLACE) 100 MG capsule Take 1 capsule (100 mg total) by mouth 2 (two) times daily.  [name] HYDROcodone-acetaminophen (NORCO/VICODIN) 5-325 MG tablet Take 1 tablet by mouth every 6 (six) hours as needed for moderate pain.  [name] lisinopril (PRINIVIL,ZESTRIL) 40 MG tablet Take 40 mg by mouth at bedtime.  . metoprolol tartrate (LOPRESSOR) 25 MG tablet Take 0.5 tablets (12.5 mg total) by mouth daily. Please make  yearly appt with Dr. Tamala Julian for May before anymore refills. 1st attempt (Patient taking differently: Take 12.5 mg by mouth 2 (two) times daily. Please make yearly appt with Dr. Tamala Julian for May before anymore refills. 1st attempt)  . Multiple Vitamins-Minerals (SENIOR MULTIVITAMIN PLUS PO) Take 1 tablet by mouth every morning.   . nitroGLYCERIN (NITROSTAT) 0.4 MG SL tablet Place 1 tablet (0.4 mg total) under the tongue every 5 (five) minutes as needed for chest pain (up to 3 doses).  . pantoprazole (PROTONIX) 20 MG tablet Take 20 mg by mouth daily.      Allergies:   Aspirin and Quinine derivatives   Social History   Tobacco Use  . Smoking status: Never Smoker  . Smokeless tobacco: Never Used  Substance Use Topics  . Alcohol use: Yes    Comment: occasional beer   . Drug use: No     Family Hx: The patient's family history includes Hypertension in his mother. There is no history of Stroke.  ROS:   Please see the history of present illness.    ROS All other systems reviewed and are negative.   EKGs/Labs/Other Test Reviewed:    EKG:  EKG is  ordered today.  The ekg ordered today demonstrates sinus bradycardia, heart rate 51, left axis deviation, QTC 418, first-degree AV block PR 216  Recent Labs: 03/20/2017: ALT 21 01/12/2018: BUN 34; Creatinine, Ser 1.29; Hemoglobin 12.9; Platelets 158; Potassium 3.9; Sodium 130   Recent Lipid Panel Lab Results  Component Value Date/Time   CHOL 108 03/20/2017 01:31 PM   TRIG 87 03/20/2017 01:31 PM   HDL 45 03/20/2017 01:31 PM   CHOLHDL 2.4 03/20/2017 01:31 PM   CHOLHDL 2.7 09/15/2015 02:05 AM   LDLCALC 46 03/20/2017 01:31 PM    Physical Exam:    VS:  BP 124/62   Pulse (!) 51   Ht 5\' 9"  (1.753 m)   Wt 158 lb (71.7 kg)   SpO2 97%   BMI 23.33 kg/m     Wt Readings from Last 3 Encounters:  03/02/18 158 lb (71.7 kg)  01/08/18 165 lb (74.8 kg)  02/27/17 167 lb 12.8 oz (76.1 kg)     Physical Exam  Constitutional: He is oriented to person,  place, and time. He appears well-developed and well-nourished. No distress.  HENT:  Head: Normocephalic and atraumatic.  Neck: Neck supple.  Cardiovascular: Normal rate, regular rhythm, S1 normal and S2 normal.  No murmur heard. Pulmonary/Chest: Breath sounds normal. He has no rales.  Abdominal: Soft.  Musculoskeletal: He exhibits no edema.  Neurological: He is alert and oriented to person, place, and time.  Skin: Skin is warm and dry.    ASSESSMENT & PLAN:    Preoperative cardiovascular examination He has a history of coronary artery disease status post myocardial infarction in 2014 treated with stenting of the RCA.  He  also has severe residual disease in the LCx.  Follow-up nuclear stress testing has been negative for ischemia in this territory and he has been managed medically.  His perioperative risk of major cardiac event is elevated at 6.6% according to the Revised Cardiac Risk Index (RCRI).  His functional status is difficult to assess.  His functional capacity is poor according to the Duke Activity Status Index (DASI) at 3.97 METs.  His last stress test was 2 years ago.  Therefore, I have recommended proceeding with nuclear stress test for further risk stratification prior to his surgery.  -Arrange a Lexiscan Myoview  -Further recommendations to follow  Coronary artery disease involving native coronary artery of native heart without angina pectoris  History of inferior MI in 2014 treated with stenting to the RCA.  He has severe residual disease in the LCx treated medically.  Last nuclear stress test in 2017 demonstrated inferior scar, no ischemia.  He denies chest pain.  However, he needs to be cleared for upcoming surgery under general anesthesia.  Given his poor functional capacity, I recommend proceeding with stress testing as outlined above.  He will continue statin, Plavix, beta blocker.  -Ideally, his surgery should be done without interruption of Plavix therapy  -If bleeding  risk is too great, Plavix should be resumed postoperatively as soon as it is safe  Shortness of breath Proceed with stress testing as noted.  His wife also describes what sounds like apneic episodes.  Consider arranging sleep testing at follow-up.  Essential hypertension The patient's blood pressure is controlled on his current regimen.  Continue current therapy.   Dyslipidemia LDL optimal on most recent lab work.  Continue current Rx.     Dispo:  Return in about 2 months (around 05/02/2018) for Scheduled Follow Up with Dr. Tamala Julian.   Medication Adjustments/Labs and Tests Ordered: Current medicines are reviewed at length with the patient today.  Concerns regarding medicines are outlined above.  Tests Ordered: Orders Placed This Encounter  Procedures  . Myocardial Perfusion Imaging  . EKG 12-Lead   Medication Changes: No orders of the defined types were placed in this encounter.   Signed, Richardson Dopp, PA-C  03/02/2018 1:00 PM    [facility] [place], [place], [place]  [address] Phone: [phone]; Fax: [phone]   ADDENDUM 03/07/2018 3:25 PM  Nuclear stress test 5/19: EF 53, no ischemia, no infarction; Low Risk Therefore patient may proceed with his procedure at acceptable risk. Richardson Dopp, PA-C    03/07/2018 3:26 PM

## 2018-03-02 NOTE — Patient Instructions (Signed)
Medication Instructions:  1. Your physician recommends that you continue on your current medications as directed. Please refer to the Current Medication list given to you today.   Labwork: NONE ORDERED TODAY  Testing/Procedures: 1. Your physician has requested that you have a lexiscan myoview. For further information please visit [email or web address]. Please follow instruction sheet, as given.  THIS IS FOR SURGERY CLEARANCE   Follow-Up: KEEP YOUR APPT WITH DR. Tamala Julian 04/2018  Any Other Special Instructions Will Be Listed Below (If Applicable).     If you need a refill on your cardiac medications before your next appointment, please call your pharmacy.

## 2018-03-07 ENCOUNTER — Ambulatory Visit (HOSPITAL_COMMUNITY)
Admission: RE | Admit: 2018-03-07 | Discharge: 2018-03-07 | Disposition: A | Discharge status: Home / Self Care | Payer: Medicare Other | Source: Ambulatory Visit | Attending: Cardiology | Admitting: Cardiology

## 2018-03-07 ENCOUNTER — Encounter: Payer: Self-pay | Admitting: Physician Assistant

## 2018-03-07 DIAGNOSIS — I10 Essential (primary) hypertension: Secondary | ICD-10-CM | POA: Insufficient documentation

## 2018-03-07 DIAGNOSIS — I252 Old myocardial infarction: Secondary | ICD-10-CM | POA: Diagnosis not present

## 2018-03-07 DIAGNOSIS — I2510 Atherosclerotic heart disease of native coronary artery without angina pectoris: Secondary | ICD-10-CM

## 2018-03-07 DIAGNOSIS — R0602 Shortness of breath: Secondary | ICD-10-CM | POA: Insufficient documentation

## 2018-03-07 DIAGNOSIS — Z01810 Encounter for preprocedural cardiovascular examination: Secondary | ICD-10-CM | POA: Diagnosis not present

## 2018-03-07 DIAGNOSIS — Z955 Presence of coronary angioplasty implant and graft: Secondary | ICD-10-CM | POA: Diagnosis not present

## 2018-03-07 DIAGNOSIS — Z8673 Personal history of transient ischemic attack (TIA), and cerebral infarction without residual deficits: Secondary | ICD-10-CM | POA: Diagnosis not present

## 2018-03-07 DIAGNOSIS — F419 Anxiety disorder, unspecified: Secondary | ICD-10-CM | POA: Insufficient documentation

## 2018-03-07 DIAGNOSIS — K219 Gastro-esophageal reflux disease without esophagitis: Secondary | ICD-10-CM | POA: Diagnosis not present

## 2018-03-07 LAB — MYOCARDIAL PERFUSION IMAGING
CHL CUP NUCLEAR SDS: 1
LVDIAVOL: 119 mL (ref 62–150)
LVSYSVOL: 56 mL
NUC STRESS TID: 1.04
Peak HR: 88 {beats}/min
Rest HR: 53 {beats}/min
SRS: 1
SSS: 2

## 2018-03-07 MED ORDER — TECHNETIUM TC 99M TETROFOSMIN IV KIT
10.3000 | PACK | Freq: Once | INTRAVENOUS | Status: AC | PRN
  Administered 2018-03-07: 10.3 via INTRAVENOUS
  Filled 2018-03-07: qty 11

## 2018-03-07 MED ORDER — TECHNETIUM TC 99M TETROFOSMIN IV KIT
32.8000 | PACK | Freq: Once | INTRAVENOUS | Status: AC | PRN
  Administered 2018-03-07: 32.8 via INTRAVENOUS
  Filled 2018-03-07: qty 33

## 2018-03-07 MED ORDER — REGADENOSON 0.4 MG/5ML IV SOLN
0.4000 mg | Freq: Once | INTRAVENOUS | Status: AC
  Administered 2018-03-07: 0.4 mg via INTRAVENOUS

## 2018-03-07 NOTE — Telephone Encounter (Signed)
   Primary Cardiologist: Sinclair Grooms, MD  Nuclear stress test 03/07/18 low risk. Patient may proceed with at acceptable risk. Office note has been faxed to surgeon.  Richardson Dopp, PA-C 03/07/2018, 3:30 PM

## 2018-03-08 ENCOUNTER — Telehealth: Payer: Self-pay | Admitting: Physician Assistant

## 2018-03-08 ENCOUNTER — Other Ambulatory Visit: Payer: Self-pay | Admitting: Interventional Cardiology

## 2018-03-08 NOTE — Telephone Encounter (Signed)
New Message   Pt calling about stress test result. Please call

## 2018-03-08 NOTE — Telephone Encounter (Signed)
Pt has been notified Myoview results and is cleared for his surgery. I will fax ov note and Myoview to Dr. Trenton Gammon per pt . I will also fax results and ov note to PCP. Pt thanked me for the call today.

## 2018-03-08 NOTE — Telephone Encounter (Signed)
-----   Message from Liliane Shi, [place] sent at 03/07/2018  3:27 PM EDT ----- Please call the patient. The stress test does not demonstrate any ischemia (ie loss of blood flow from a blockage). He may proceed with his procedure at acceptable risk.  Please send a copy of the stress test to his surgeon along with my office note. Please fax a copy to PCP:  Nicoletta Dress, MD  Richardson Dopp, PA-C    03/07/2018 3:19 PM

## 2018-03-09 ENCOUNTER — Telehealth: Payer: Self-pay | Admitting: Interventional Cardiology

## 2018-03-09 NOTE — Telephone Encounter (Signed)
Calling concerning clearance for pt.

## 2018-03-09 NOTE — Telephone Encounter (Signed)
    Per Julaine Hua, CMA Pt has been notified Myoview results and is cleared for his surgery. I will fax ov note and Myoview to Dr. Trenton Gammon per pt . I will also fax results and ov note to PCP. Pt thanked me for the call today.   03/07/2018  3:27 PM EDT ----- Please call the patient. The stress test does not demonstrate any ischemia (ie loss of blood flow from a blockage). He may proceed with his procedure at acceptable risk.  Please send a copy of the stress test to his surgeon along with my office note. Please fax a copy to PCP:  Nicoletta Dress, MD  Richardson Dopp, PA-C    03/07/2018 3:19 PM  05/24 Note Coronary artery disease involving native coronary artery of native heart without angina pectoris  History of inferior MI in 2014 treated with stenting to the RCA.  He has severe residual disease in the LCx treated medically.  Last nuclear stress test in 2017 demonstrated inferior scar, no ischemia.  He denies chest pain.  However, he needs to be cleared for upcoming surgery under general anesthesia.  Given his poor functional capacity, I recommend proceeding with stress testing as outlined above.  He will continue statin, Plavix, beta blocker.             -Ideally, his surgery should be done without interruption of Plavix therapy             -If bleeding risk is too great, Plavix should be resumed postoperatively as soon as it is safe  Spoke to Dr [employer] office staff.  Reviewed Scott's notes with her and reiterated the recommendation that he be off the Plavix as soon as possible.  However, if the only way he can have the surgery is to be off the Plavix, the Plavix will have to be stopped.  She will get the recommendations from Dr. Trenton Gammon on the minimum amount of time the Plavix can be stopped and contact the patient with this information.  After reviewing the above information, I will close the encounter.  Rosaria Ferries, PA-C 03/09/2018 4:08 PM [facility] [phone]

## 2018-03-09 NOTE — Telephone Encounter (Signed)
New message    Patient was cleared for surgery by Richardson Dopp but the plavix was not addressed       [facility] Pre-operative Risk Assessment    Request for surgical clearance:  What type of surgery is being performed?  kyphoplasty lumbar  1. When is this surgery scheduled? 03/14/18   2. What type of clearance is required (medical clearance vs. Pharmacy clearance to hold med vs. Both)? pharmacy  3. Are there any medications that need to be held prior to surgery and how long? plavix and how many days does he need to hold it   4. Practice name and name of physician performing surgery? Dr pool   5. What is your office phone numbe [phone]    7.   What is your office fax number [phone]   8.   Anesthesia type (None, local, MAC, general) ? General   Matthew Liu 03/09/2018, 11:51 AM  _________________________________________________________________   (provider comments below)

## 2018-04-11 ENCOUNTER — Other Ambulatory Visit: Payer: Self-pay | Admitting: Interventional Cardiology

## 2018-04-29 NOTE — Progress Notes (Signed)
Cardiology Office Note:    Date:  05/02/2018   ID:  Matthew Liu, DOB [birth year], MRN [id number]  PCP:  Nicoletta Dress, MD  Cardiologist:  Sinclair Grooms, MD   Referring MD: Nicoletta Dress, MD   Chief Complaint  Patient presents with  . Coronary Artery Disease    History of Present Illness:    Matthew Liu is a 77 y.o. male with a hx of CAD With history of right coronary ostial and mid vessel stenting during acute inferior infarction 2014, residual complex mid and distal circumflex disease not demonstrated to cause ischemia by scintigraphy in 2015, hyperlipidemia, CVA, and hypertension. Recent Kyphoplasty.  He has not had cardiac complaints.  He denies chest pain.  He denies dyspnea.  He is physically active.  No lower extremity swelling.  No exertional leg discomfort.   Past Medical History:  Diagnosis Date  . Ankle fracture, left 2009   rod placed  . Anxiety   . Aspirin allergy    a. Anaphylactic rxn.  . Barrett's esophagus   . CAD (coronary artery disease)    a. Inf STEMI 07/2013: s/p DES x 2 from ostial to [facility] 07/13/13. Residual Cx disease for med rx (PCI if recurrent pain).  . Dyslipidemia   . ED (erectile dysfunction)   . GERD (gastroesophageal reflux disease)   . History of nuclear stress test    Nuclear stress test 5/19: EF 53, no ischemia, no infarction; Low Risk  . Hydrocele of testis   . Hyperglycemia    a. A1C 5.8 in 07/2013.  [name] Hypertension   . Myocardial infarction [facility])     Past Surgical History:  Procedure Laterality Date  . cataract surgery      bilateral   . CORONARY ANGIOPLASTY     stents   . HYDROCELE EXCISION Left 07/17/2014   Procedure: LEFT HYDROCELECTOMY;  Surgeon: Malka So, MD;  Location: [facility] ORS;  Service: Urology;  Laterality: Left;  . left ankle surgery  1999  . LEFT HEART CATHETERIZATION WITH CORONARY ANGIOGRAM N/A 07/13/2013   Procedure: LEFT HEART CATHETERIZATION WITH CORONARY ANGIOGRAM;  Surgeon: Sinclair Grooms,  MD;  Location: [facility] CATH LAB;  Service: Cardiovascular;  Laterality: N/A;  . PERCUTANEOUS CORONARY STENT INTERVENTION (PCI-S)  07/13/2013   Procedure: PERCUTANEOUS CORONARY STENT INTERVENTION (PCI-S);  Surgeon: Sinclair Grooms, MD;  Location: [facility] CATH LAB;  Service: Cardiovascular;;    Current Medications: Current Meds  Medication Sig  . acetaminophen (TYLENOL) 500 MG tablet Take 1 tablet (500 mg total) by mouth every 6 (six) hours as needed for mild pain or headache.  . ALPRAZolam (XANAX) 0.25 MG tablet Take 0.25 mg by mouth 3 (three) times daily as needed for anxiety.   [name] atorvastatin (LIPITOR) 80 MG tablet Take 40 mg by mouth daily.  . clopidogrel (PLAVIX) 75 MG tablet TAKE 1 TABLET BY MOUTH EVERY DAY  . hydrochlorothiazide (MICROZIDE) 12.5 MG capsule TAKE 1 CAPSULE BY MOUTH EVERY DAY  . lisinopril (PRINIVIL,ZESTRIL) 40 MG tablet Take 40 mg by mouth at bedtime.  . metoprolol tartrate (LOPRESSOR) 25 MG tablet Take 12.5 mg by mouth 2 (two) times daily.  . Multiple Vitamins-Minerals (SENIOR MULTIVITAMIN PLUS PO) Take 1 tablet by mouth every morning.   . nitroGLYCERIN (NITROSTAT) 0.4 MG SL tablet Place 1 tablet (0.4 mg total) under the tongue every 5 (five) minutes as needed for chest pain (up to 3 doses).  . pantoprazole (PROTONIX) 20 MG tablet Take  20 mg by mouth daily.      Allergies:   Aspirin and Quinine derivatives   Social History   Socioeconomic History  . Marital status: Married    Spouse name: [name]  . Number of children: 2  . Years of education: 45  . Highest education level: Not on file  Occupational History  . Occupation: Contractor  Social Needs  . Financial resource strain: Not on file  . Food insecurity:    Worry: Not on file    Inability: Not on file  . Transportation needs:    Medical: Not on file    Non-medical: Not on file  Tobacco Use  . Smoking status: Never Smoker  . Smokeless tobacco: Never Used  Substance and Sexual Activity    . Alcohol use: Yes    Comment: occasional beer   . Drug use: No  . Sexual activity: Not Currently  Lifestyle  . Physical activity:    Days per week: Not on file    Minutes per session: Not on file  . Stress: Not on file  Relationships  . Social connections:    Talks on phone: Not on file    Gets together: Not on file    Attends religious service: Not on file    Active member of club or organization: Not on file    Attends meetings of clubs or organizations: Not on file    Relationship status: Not on file  Other Topics Concern  . Not on file  Social History Narrative   Lives with wife   Caffeine use: 1-2 cups coffee per day     Family History: The patient's family history includes Hypertension in his mother. There is no history of Stroke.  ROS:   Please see the history of present illness.    Back discomfort related to recent lower lumbar spine vertebral compression requiring kyphoplasty.  Still having back discomfort.  All other systems reviewed and are negative.  EKGs/Labs/Other Studies Reviewed:    The following studies were reviewed today: Nuclear stress test Mar 07, 2018:  Study Highlights    Nuclear stress EF: 53%. The left ventricular ejection fraction is mildly decreased (45-54%).  There was no ST segment deviation noted during stress.  The study is normal. no ischemia. no infarction  This is a low risk study.     EKG:  EKG is not ordered today.  Tracing performed 03/02/2018 reveals sinus bradycardia but is otherwise normal.  Recent Labs: 01/12/2018: BUN 34; Creatinine, Ser 1.29; Hemoglobin 12.9; Platelets 158; Potassium 3.9; Sodium 130  Recent Lipid Panel    Component Value Date/Time   CHOL 108 03/20/2017 1331   TRIG 87 03/20/2017 1331   HDL 45 03/20/2017 1331   CHOLHDL 2.4 03/20/2017 1331   CHOLHDL 2.7 09/15/2015 0205   VLDL 22 09/15/2015 0205   LDLCALC 46 03/20/2017 1331    Physical Exam:    VS:  BP (!) 146/82   Pulse 64   Ht 5\' 9"  (1.753  m)   Wt 157 lb 6.4 oz (71.4 kg)   BMI 23.24 kg/m     Wt Readings from Last 3 Encounters:  05/02/18 157 lb 6.4 oz (71.4 kg)  03/07/18 158 lb (71.7 kg)  03/02/18 158 lb (71.7 kg)     GEN:  Well nourished, well developed in no acute distress HEENT: Normal NECK: No JVD. LYMPHATICS: No lymphadenopathy CARDIAC: RRR, no murmur, no gallop, no edema. VASCULAR: 2+ radial and dorsalis pedis pulses.  No bruits. RESPIRATORY:  Clear to auscultation without rales, wheezing or rhonchi  ABDOMEN: Soft, non-tender, non-distended, No pulsatile mass, MUSCULOSKELETAL: No deformity  SKIN: Warm and dry NEUROLOGIC:  Alert and oriented x 3 PSYCHIATRIC:  Normal affect   ASSESSMENT:    1. Coronary artery disease involving native coronary artery of native heart without angina pectoris   2. Essential hypertension   3. Dyslipidemia   4. Acute ischemic stroke ([place])   5. OSA (obstructive sleep apnea)    PLAN:    In order of problems listed above:  1. Stable without angina.  Secondary risk modification: LDL less than 70, A1c less than 7, blood pressure 130/80 mmHg or less, and 150 minutes of moderate aerobic activity per week. 2. Low-salt diet and blood pressure monitoring. 3. Target LDL less than 70.  No values at this year.  Upcoming complete exam with primary.  Clinical follow-up in 1 year.  Encouraged that he avoid ladders and activities that could lead to recurrent injury.   Medication Adjustments/Labs and Tests Ordered: Current medicines are reviewed at length with the patient today.  Concerns regarding medicines are outlined above.  No orders of the defined types were placed in this encounter.  No orders of the defined types were placed in this encounter.   Patient Instructions  Medication Instructions:  Your physician recommends that you continue on your current medications as directed. Please refer to the Current Medication list given to you  today.  Labwork: None  Testing/Procedures: None  Follow-Up: Your physician wants you to follow-up in: 1 year with Dr. Tamala Julian.  You will receive a reminder letter in the mail two months in advance. If you don't receive a letter, please call our office to schedule the follow-up appointment.   Any Other Special Instructions Will Be Listed Below (If Applicable).     If you need a refill on your cardiac medications before your next appointment, please call your pharmacy.      Signed, Sinclair Grooms, MD  05/02/2018 12:54 PM    [place]

## 2018-05-02 ENCOUNTER — Encounter: Payer: Self-pay | Admitting: Interventional Cardiology

## 2018-05-02 ENCOUNTER — Ambulatory Visit: Payer: Medicare Other | Admitting: Interventional Cardiology

## 2018-05-02 VITALS — BP 146/82 | HR 64 | Ht 69.0 in | Wt 157.4 lb

## 2018-05-02 DIAGNOSIS — E785 Hyperlipidemia, unspecified: Secondary | ICD-10-CM

## 2018-05-02 DIAGNOSIS — I639 Cerebral infarction, unspecified: Secondary | ICD-10-CM | POA: Diagnosis not present

## 2018-05-02 DIAGNOSIS — I2510 Atherosclerotic heart disease of native coronary artery without angina pectoris: Secondary | ICD-10-CM

## 2018-05-02 DIAGNOSIS — I10 Essential (primary) hypertension: Secondary | ICD-10-CM | POA: Diagnosis not present

## 2018-05-02 DIAGNOSIS — G4733 Obstructive sleep apnea (adult) (pediatric): Secondary | ICD-10-CM

## 2018-05-02 NOTE — Patient Instructions (Signed)

## 2018-05-12 ENCOUNTER — Other Ambulatory Visit: Payer: Self-pay | Admitting: Neurosurgery

## 2018-05-12 DIAGNOSIS — S32010A Wedge compression fracture of first lumbar vertebra, initial encounter for closed fracture: Secondary | ICD-10-CM

## 2018-06-25 ENCOUNTER — Other Ambulatory Visit: Payer: Medicare Other

## 2018-07-12 ENCOUNTER — Other Ambulatory Visit: Payer: Self-pay | Admitting: Interventional Cardiology

## 2018-10-30 ENCOUNTER — Encounter: Payer: Self-pay | Admitting: Interventional Cardiology

## 2018-11-15 NOTE — Progress Notes (Signed)
Cardiology Office Note:    Date:  11/16/2018   ID:  Matthew Liu, DOB 03/21/1941, MRN [id number]  PCP:  Nicoletta Dress, MD  Cardiologist:  Sinclair Grooms, MD   Referring MD: Nicoletta Dress, MD   Chief Complaint  Patient presents with  . Coronary Artery Disease    History of Present Illness:    Matthew Liu is a 78 y.o. male with a hx of right coronary ostial and mid vessel stenting during acute inferior infarction 2014, residual complex mid and distal circumflex disease not demonstrated to cause ischemia by scintigraphy in 2015,hyperlipidemia, CVA, and hypertension. Recent Kyphoplasty.  He has been having angina for proximally 4 weeks.  States that if he walks up an incline, 150 feet or more, his chest will get tight.  Also having episodes of tightness if he becomes angry or upset.  Rest or removal from the situation helps it to improve.  He does not have nitroglycerin.  He has not had an episode to last longer than 10 minutes.  All episodes are provoked by emotions or physical activity.  He denies palpitations and heart racing.  No episodes of orthopnea, PND, or edema.  He has not had blood in his urine or stool.  States that he sleeps well but is out of energy.   He is compliant with his current medical therapy.  Past Medical History:  Diagnosis Date  . Ankle fracture, left 2009   rod placed  . Anxiety   . Aspirin allergy    a. Anaphylactic rxn.  . Barrett's esophagus   . CAD (coronary artery disease)    a. Inf STEMI 07/2013: s/p DES x 2 from ostial to [facility] 07/13/13. Residual Cx disease for med rx (PCI if recurrent pain).  . Dyslipidemia   . ED (erectile dysfunction)   . GERD (gastroesophageal reflux disease)   . History of nuclear stress test    Nuclear stress test 5/19: EF 53, no ischemia, no infarction; Low Risk  . Hydrocele of testis   . Hyperglycemia    a. A1C 5.8 in 07/2013.  [name] Hypertension   . Myocardial infarction [facility])     Past Surgical  History:  Procedure Laterality Date  . cataract surgery      bilateral   . CORONARY ANGIOPLASTY     stents   . HYDROCELE EXCISION Left 07/17/2014   Procedure: LEFT HYDROCELECTOMY;  Surgeon: Malka So, MD;  Location: [facility] ORS;  Service: Urology;  Laterality: Left;  . left ankle surgery  1999  . LEFT HEART CATHETERIZATION WITH CORONARY ANGIOGRAM N/A 07/13/2013   Procedure: LEFT HEART CATHETERIZATION WITH CORONARY ANGIOGRAM;  Surgeon: Sinclair Grooms, MD;  Location: [facility] CATH LAB;  Service: Cardiovascular;  Laterality: N/A;  . PERCUTANEOUS CORONARY STENT INTERVENTION (PCI-S)  07/13/2013   Procedure: PERCUTANEOUS CORONARY STENT INTERVENTION (PCI-S);  Surgeon: Sinclair Grooms, MD;  Location: [facility] CATH LAB;  Service: Cardiovascular;;    Current Medications: Current Meds  Medication Sig  . acetaminophen (TYLENOL) 500 MG tablet Take 1 tablet (500 mg total) by mouth every 6 (six) hours as needed for mild pain or headache.  . ALPRAZolam (XANAX) 0.25 MG tablet Take 0.25 mg by mouth 3 (three) times daily as needed for anxiety.   [name] atorvastatin (LIPITOR) 80 MG tablet Take 40 mg by mouth daily.  . clopidogrel (PLAVIX) 75 MG tablet TAKE 1 TABLET BY MOUTH EVERY DAY  . hydrochlorothiazide (MICROZIDE) 12.5 MG capsule TAKE 1  CAPSULE BY MOUTH EVERY DAY  . lisinopril (PRINIVIL,ZESTRIL) 40 MG tablet Take 40 mg by mouth at bedtime.  . metoprolol tartrate (LOPRESSOR) 25 MG tablet TAKE 1/2 TABLET BY MOUTH TWICE DAILY  . Multiple Vitamins-Minerals (SENIOR MULTIVITAMIN PLUS PO) Take 1 tablet by mouth every morning.   . nitroGLYCERIN (NITROSTAT) 0.4 MG SL tablet Place 1 tablet (0.4 mg total) under the tongue every 5 (five) minutes as needed for chest pain (up to 3 doses).  . pantoprazole (PROTONIX) 20 MG tablet Take 20 mg by mouth daily.   . traMADol (ULTRAM) 50 MG tablet Take 1 tablet by mouth as needed.  . [DISCONTINUED] nitroGLYCERIN (NITROSTAT) 0.4 MG SL tablet Place 1 tablet (0.4 mg total) under the tongue every  5 (five) minutes as needed for chest pain (up to 3 doses).     Allergies:   Aspirin and Quinine derivatives   Social History   Socioeconomic History  . Marital status: Married    Spouse name: [name]  . Number of children: 2  . Years of education: 61  . Highest education level: Not on file  Occupational History  . Occupation: Contractor  Social Needs  . Financial resource strain: Not on file  . Food insecurity:    Worry: Not on file    Inability: Not on file  . Transportation needs:    Medical: Not on file    Non-medical: Not on file  Tobacco Use  . Smoking status: Never Smoker  . Smokeless tobacco: Never Used  Substance and Sexual Activity  . Alcohol use: Yes    Comment: occasional beer   . Drug use: No  . Sexual activity: Not Currently  Lifestyle  . Physical activity:    Days per week: Not on file    Minutes per session: Not on file  . Stress: Not on file  Relationships  . Social connections:    Talks on phone: Not on file    Gets together: Not on file    Attends religious service: Not on file    Active member of club or organization: Not on file    Attends meetings of clubs or organizations: Not on file    Relationship status: Not on file  Other Topics Concern  . Not on file  Social History Narrative   Lives with wife   Caffeine use: 1-2 cups coffee per day     Family History: The patient's family history includes Hypertension in his mother. There is no history of Stroke.  ROS:   Please see the history of present illness.    Had a fractured back in April.  He has had lethargy and decreased interest in activities.  He has lost weight and not regained it.  All other systems reviewed and are negative.  EKGs/Labs/Other Studies Reviewed:    The following studies were reviewed today: Myocardial perfusion imaging Mar 07, 2018: Study Highlights    Nuclear stress EF: 53%. The left ventricular ejection fraction is mildly decreased  (45-54%).  There was no ST segment deviation noted during stress.  The study is normal. no ischemia. no infarction  This is a low risk study.       EKG:  EKG is bradycardia, prominent voltage, no change from prior EKG performed in May 2019.  Recent Labs: 01/12/2018: BUN 34; Creatinine, Ser 1.29; Hemoglobin 12.9; Platelets 158; Potassium 3.9; Sodium 130  Recent Lipid Panel    Component Value Date/Time   CHOL 108 03/20/2017 1331  TRIG 87 03/20/2017 1331   HDL 45 03/20/2017 1331   CHOLHDL 2.4 03/20/2017 1331   CHOLHDL 2.7 09/15/2015 0205   VLDL 22 09/15/2015 0205   LDLCALC 46 03/20/2017 1331    Physical Exam:    VS:  BP 128/90   Pulse (!) 55   Ht 5\' 9"  (1.753 m)   Wt 165 lb 3.2 oz (74.9 kg)   SpO2 96%   BMI 24.40 kg/m     Wt Readings from Last 3 Encounters:  11/16/18 165 lb 3.2 oz (74.9 kg)  05/02/18 157 lb 6.4 oz (71.4 kg)  03/07/18 158 lb (71.7 kg)     GEN: Elderly but compensated appearing. No acute distress HEENT: Normal NECK: No JVD. LYMPHATICS: No lymphadenopathy CARDIAC: RRR.  No murmur, no gallop, no edema VASCULAR: 2+ bilateral radial and carotid pulses, no bruits RESPIRATORY:  Clear to auscultation without rales, wheezing or rhonchi  ABDOMEN: Soft, non-tender, non-distended, No pulsatile mass, MUSCULOSKELETAL: No deformity  SKIN: Warm and dry NEUROLOGIC:  Alert and oriented x 3 PSYCHIATRIC:  Normal affect   ASSESSMENT:    1. Coronary artery disease of native artery of native heart with stable angina pectoris ([place])   2. Essential hypertension   3. Dyslipidemia   4. OSA (obstructive sleep apnea)    PLAN:    In order of problems listed above:  1. Add isosorbide mononitrate.  Instructed on use of nitroglycerin.  Return in 2 to 4 weeks for clinical follow-up.  If nitroglycerin responsive but still having significant chest discomfort, will need coronary angiography.  This was discussed with the patient and his wife. 2. Blood pressure is adequately  controlled.  Low-salt diet is advocated. 3. Target LDL is less than 70.  September 2019 LDL was 52. 4. Struct of sleep apnea is being managed with CPAP.  Overall education and awareness concerning primary/secondary risk prevention was discussed in detail: LDL less than 70, hemoglobin A1c less than 7, blood pressure target less than 130/80 mmHg, >150 minutes of moderate aerobic activity per week, avoidance of smoking, weight control (via diet and exercise), and continued surveillance/management of/for obstructive sleep apnea.  Clinical follow-up in 2 to 4 weeks to reassess angina.  Any discomfort lasting longer than 20 minutes should warrant evaluation in the emergency room.  He should not drive himself to the emergency room.  Any medication side effect which occurs should prompt a phone call for further instruction.     Medication Adjustments/Labs and Tests Ordered: Current medicines are reviewed at length with the patient today.  Concerns regarding medicines are outlined above.  No orders of the defined types were placed in this encounter.  Meds ordered this encounter  Medications  . isosorbide mononitrate (IMDUR) 30 MG 24 hr tablet    Sig: Take 1 tablet (30 mg total) by mouth daily.    Dispense:  90 tablet    Refill:  3  . nitroGLYCERIN (NITROSTAT) 0.4 MG SL tablet    Sig: Place 1 tablet (0.4 mg total) under the tongue every 5 (five) minutes as needed for chest pain (up to 3 doses).    Dispense:  25 tablet    Refill:  4    Patient Instructions  Medication Instructions:  1) START Imdur (Isosorbide) 30mg  once daily 2)  A prescription has been sent in for Nitroglycerin.  If you have chest pain that doesn't relieve quickly, place one tablet under your tongue and allow it to dissolve.  If no relief after 5 minutes, you  may take another pill.  If no relief after 5 minutes, you may take a 3rd dose but you need to call 911 and report to ER immediately.  If you need a refill on your cardiac  medications before your next appointment, please call your pharmacy.   Lab work: None If you have labs (blood work) drawn today and your tests are completely normal, you will receive your results only by: [name] MyChart Message (if you have MyChart) OR . A paper copy in the mail If you have any lab test that is abnormal or we need to change your treatment, we will call you to review the results.  Testing/Procedures: None  Follow-Up: At [facility], you and your health needs are our priority.  As part of our continuing mission to provide you with exceptional heart care, we have created designated Provider Care Teams.  These Care Teams include your primary Cardiologist (physician) and Advanced Practice Providers (APPs -  Physician Assistants and Nurse Practitioners) who all work together to provide you with the care you need, when you need it. You will need a follow up appointment in 1 months.  Please call our office 2 months in advance to schedule this appointment.  You may see Sinclair Grooms, MD or one of the following Advanced Practice Providers on your designated Care Team:   Truitt Merle, NP Cecilie Kicks, NP . Kathyrn Drown, NP  Any Other Special Instructions Will Be Listed Below (If Applicable).       Signed, Sinclair Grooms, MD  11/16/2018 8:49 AM    [place]

## 2018-11-15 NOTE — H&P (View-Only) (Signed)
Cardiology Office Note:    Date:  11/16/2018   ID:  ALEK BORGES, DOB 1941-10-04, MRN [id number]  PCP:  Nicoletta Dress, MD  Cardiologist:  Sinclair Grooms, MD   Referring MD: Nicoletta Dress, MD   Chief Complaint  Patient presents with  . Coronary Artery Disease    History of Present Illness:    Matthew Liu is a 78 y.o. male with a hx of right coronary ostial and mid vessel stenting during acute inferior infarction 2014, residual complex mid and distal circumflex disease not demonstrated to cause ischemia by scintigraphy in 2015,hyperlipidemia, CVA, and hypertension. Recent Kyphoplasty.  He has been having angina for proximally 4 weeks.  States that if he walks up an incline, 150 feet or more, his chest will get tight.  Also having episodes of tightness if he becomes angry or upset.  Rest or removal from the situation helps it to improve.  He does not have nitroglycerin.  He has not had an episode to last longer than 10 minutes.  All episodes are provoked by emotions or physical activity.  He denies palpitations and heart racing.  No episodes of orthopnea, PND, or edema.  He has not had blood in his urine or stool.  States that he sleeps well but is out of energy.   He is compliant with his current medical therapy.  Past Medical History:  Diagnosis Date  . Ankle fracture, left 2009   rod placed  . Anxiety   . Aspirin allergy    a. Anaphylactic rxn.  . Barrett's esophagus   . CAD (coronary artery disease)    a. Inf STEMI 07/2013: s/p DES x 2 from ostial to [facility] 07/13/13. Residual Cx disease for med rx (PCI if recurrent pain).  . Dyslipidemia   . ED (erectile dysfunction)   . GERD (gastroesophageal reflux disease)   . History of nuclear stress test    Nuclear stress test 5/19: EF 53, no ischemia, no infarction; Low Risk  . Hydrocele of testis   . Hyperglycemia    a. A1C 5.8 in 07/2013.  [name] Hypertension   . Myocardial infarction [facility])     Past Surgical  History:  Procedure Laterality Date  . cataract surgery      bilateral   . CORONARY ANGIOPLASTY     stents   . HYDROCELE EXCISION Left 07/17/2014   Procedure: LEFT HYDROCELECTOMY;  Surgeon: Malka So, MD;  Location: [facility] ORS;  Service: Urology;  Laterality: Left;  . left ankle surgery  1999  . LEFT HEART CATHETERIZATION WITH CORONARY ANGIOGRAM N/A 07/13/2013   Procedure: LEFT HEART CATHETERIZATION WITH CORONARY ANGIOGRAM;  Surgeon: Sinclair Grooms, MD;  Location: [facility] CATH LAB;  Service: Cardiovascular;  Laterality: N/A;  . PERCUTANEOUS CORONARY STENT INTERVENTION (PCI-S)  07/13/2013   Procedure: PERCUTANEOUS CORONARY STENT INTERVENTION (PCI-S);  Surgeon: Sinclair Grooms, MD;  Location: [facility] CATH LAB;  Service: Cardiovascular;;    Current Medications: Current Meds  Medication Sig  . acetaminophen (TYLENOL) 500 MG tablet Take 1 tablet (500 mg total) by mouth every 6 (six) hours as needed for mild pain or headache.  . ALPRAZolam (XANAX) 0.25 MG tablet Take 0.25 mg by mouth 3 (three) times daily as needed for anxiety.   [name] atorvastatin (LIPITOR) 80 MG tablet Take 40 mg by mouth daily.  . clopidogrel (PLAVIX) 75 MG tablet TAKE 1 TABLET BY MOUTH EVERY DAY  . hydrochlorothiazide (MICROZIDE) 12.5 MG capsule TAKE 1  CAPSULE BY MOUTH EVERY DAY  . lisinopril (PRINIVIL,ZESTRIL) 40 MG tablet Take 40 mg by mouth at bedtime.  . metoprolol tartrate (LOPRESSOR) 25 MG tablet TAKE 1/2 TABLET BY MOUTH TWICE DAILY  . Multiple Vitamins-Minerals (SENIOR MULTIVITAMIN PLUS PO) Take 1 tablet by mouth every morning.   . nitroGLYCERIN (NITROSTAT) 0.4 MG SL tablet Place 1 tablet (0.4 mg total) under the tongue every 5 (five) minutes as needed for chest pain (up to 3 doses).  . pantoprazole (PROTONIX) 20 MG tablet Take 20 mg by mouth daily.   . traMADol (ULTRAM) 50 MG tablet Take 1 tablet by mouth as needed.  . [DISCONTINUED] nitroGLYCERIN (NITROSTAT) 0.4 MG SL tablet Place 1 tablet (0.4 mg total) under the tongue every  5 (five) minutes as needed for chest pain (up to 3 doses).     Allergies:   Aspirin and Quinine derivatives   Social History   Socioeconomic History  . Marital status: Married    Spouse name: [name]  . Number of children: 2  . Years of education: 44  . Highest education level: Not on file  Occupational History  . Occupation: Contractor  Social Needs  . Financial resource strain: Not on file  . Food insecurity:    Worry: Not on file    Inability: Not on file  . Transportation needs:    Medical: Not on file    Non-medical: Not on file  Tobacco Use  . Smoking status: Never Smoker  . Smokeless tobacco: Never Used  Substance and Sexual Activity  . Alcohol use: Yes    Comment: occasional beer   . Drug use: No  . Sexual activity: Not Currently  Lifestyle  . Physical activity:    Days per week: Not on file    Minutes per session: Not on file  . Stress: Not on file  Relationships  . Social connections:    Talks on phone: Not on file    Gets together: Not on file    Attends religious service: Not on file    Active member of club or organization: Not on file    Attends meetings of clubs or organizations: Not on file    Relationship status: Not on file  Other Topics Concern  . Not on file  Social History Narrative   Lives with wife   Caffeine use: 1-2 cups coffee per day     Family History: The patient's family history includes Hypertension in his mother. There is no history of Stroke.  ROS:   Please see the history of present illness.    Had a fractured back in April.  He has had lethargy and decreased interest in activities.  He has lost weight and not regained it.  All other systems reviewed and are negative.  EKGs/Labs/Other Studies Reviewed:    The following studies were reviewed today: Myocardial perfusion imaging Mar 07, 2018: Study Highlights    Nuclear stress EF: 53%. The left ventricular ejection fraction is mildly decreased  (45-54%).  There was no ST segment deviation noted during stress.  The study is normal. no ischemia. no infarction  This is a low risk study.       EKG:  EKG is bradycardia, prominent voltage, no change from prior EKG performed in May 2019.  Recent Labs: 01/12/2018: BUN 34; Creatinine, Ser 1.29; Hemoglobin 12.9; Platelets 158; Potassium 3.9; Sodium 130  Recent Lipid Panel    Component Value Date/Time   CHOL 108 03/20/2017 1331  TRIG 87 03/20/2017 1331   HDL 45 03/20/2017 1331   CHOLHDL 2.4 03/20/2017 1331   CHOLHDL 2.7 09/15/2015 0205   VLDL 22 09/15/2015 0205   LDLCALC 46 03/20/2017 1331    Physical Exam:    VS:  BP 128/90   Pulse (!) 55   Ht 5\' 9"  (1.753 m)   Wt 165 lb 3.2 oz (74.9 kg)   SpO2 96%   BMI 24.40 kg/m     Wt Readings from Last 3 Encounters:  11/16/18 165 lb 3.2 oz (74.9 kg)  05/02/18 157 lb 6.4 oz (71.4 kg)  03/07/18 158 lb (71.7 kg)     GEN: Elderly but compensated appearing. No acute distress HEENT: Normal NECK: No JVD. LYMPHATICS: No lymphadenopathy CARDIAC: RRR.  No murmur, no gallop, no edema VASCULAR: 2+ bilateral radial and carotid pulses, no bruits RESPIRATORY:  Clear to auscultation without rales, wheezing or rhonchi  ABDOMEN: Soft, non-tender, non-distended, No pulsatile mass, MUSCULOSKELETAL: No deformity  SKIN: Warm and dry NEUROLOGIC:  Alert and oriented x 3 PSYCHIATRIC:  Normal affect   ASSESSMENT:    1. Coronary artery disease of native artery of native heart with stable angina pectoris ([place])   2. Essential hypertension   3. Dyslipidemia   4. OSA (obstructive sleep apnea)    PLAN:    In order of problems listed above:  1. Add isosorbide mononitrate.  Instructed on use of nitroglycerin.  Return in 2 to 4 weeks for clinical follow-up.  If nitroglycerin responsive but still having significant chest discomfort, will need coronary angiography.  This was discussed with the patient and his wife. 2. Blood pressure is adequately  controlled.  Low-salt diet is advocated. 3. Target LDL is less than 70.  September 2019 LDL was 52. 4. Struct of sleep apnea is being managed with CPAP.  Overall education and awareness concerning primary/secondary risk prevention was discussed in detail: LDL less than 70, hemoglobin A1c less than 7, blood pressure target less than 130/80 mmHg, >150 minutes of moderate aerobic activity per week, avoidance of smoking, weight control (via diet and exercise), and continued surveillance/management of/for obstructive sleep apnea.  Clinical follow-up in 2 to 4 weeks to reassess angina.  Any discomfort lasting longer than 20 minutes should warrant evaluation in the emergency room.  He should not drive himself to the emergency room.  Any medication side effect which occurs should prompt a phone call for further instruction.     Medication Adjustments/Labs and Tests Ordered: Current medicines are reviewed at length with the patient today.  Concerns regarding medicines are outlined above.  No orders of the defined types were placed in this encounter.  Meds ordered this encounter  Medications  . isosorbide mononitrate (IMDUR) 30 MG 24 hr tablet    Sig: Take 1 tablet (30 mg total) by mouth daily.    Dispense:  90 tablet    Refill:  3  . nitroGLYCERIN (NITROSTAT) 0.4 MG SL tablet    Sig: Place 1 tablet (0.4 mg total) under the tongue every 5 (five) minutes as needed for chest pain (up to 3 doses).    Dispense:  25 tablet    Refill:  4    Patient Instructions  Medication Instructions:  1) START Imdur (Isosorbide) 30mg  once daily 2)  A prescription has been sent in for Nitroglycerin.  If you have chest pain that doesn't relieve quickly, place one tablet under your tongue and allow it to dissolve.  If no relief after 5 minutes, you  may take another pill.  If no relief after 5 minutes, you may take a 3rd dose but you need to call 911 and report to ER immediately.  If you need a refill on your cardiac  medications before your next appointment, please call your pharmacy.   Lab work: None If you have labs (blood work) drawn today and your tests are completely normal, you will receive your results only by: [name] MyChart Message (if you have MyChart) OR . A paper copy in the mail If you have any lab test that is abnormal or we need to change your treatment, we will call you to review the results.  Testing/Procedures: None  Follow-Up: At [facility], you and your health needs are our priority.  As part of our continuing mission to provide you with exceptional heart care, we have created designated Provider Care Teams.  These Care Teams include your primary Cardiologist (physician) and Advanced Practice Providers (APPs -  Physician Assistants and Nurse Practitioners) who all work together to provide you with the care you need, when you need it. You will need a follow up appointment in 1 months.  Please call our office 2 months in advance to schedule this appointment.  You may see Sinclair Grooms, MD or one of the following Advanced Practice Providers on your designated Care Team:   Truitt Merle, NP Cecilie Kicks, NP . Kathyrn Drown, NP  Any Other Special Instructions Will Be Listed Below (If Applicable).       Signed, Sinclair Grooms, MD  11/16/2018 8:49 AM    [place]

## 2018-11-16 ENCOUNTER — Encounter (INDEPENDENT_AMBULATORY_CARE_PROVIDER_SITE_OTHER): Payer: Self-pay

## 2018-11-16 ENCOUNTER — Encounter: Payer: Self-pay | Admitting: Interventional Cardiology

## 2018-11-16 ENCOUNTER — Ambulatory Visit: Payer: Medicare Other | Admitting: Interventional Cardiology

## 2018-11-16 VITALS — BP 128/90 | HR 55 | Ht 69.0 in | Wt 165.2 lb

## 2018-11-16 DIAGNOSIS — E785 Hyperlipidemia, unspecified: Secondary | ICD-10-CM

## 2018-11-16 DIAGNOSIS — G4733 Obstructive sleep apnea (adult) (pediatric): Secondary | ICD-10-CM

## 2018-11-16 DIAGNOSIS — I25118 Atherosclerotic heart disease of native coronary artery with other forms of angina pectoris: Secondary | ICD-10-CM | POA: Diagnosis not present

## 2018-11-16 DIAGNOSIS — I10 Essential (primary) hypertension: Secondary | ICD-10-CM | POA: Diagnosis not present

## 2018-11-16 MED ORDER — NITROGLYCERIN 0.4 MG SL SUBL: 0.4000 mg | SUBLINGUAL_TABLET | SUBLINGUAL | 4 refills | Status: DC | PRN

## 2018-11-16 MED ORDER — ISOSORBIDE MONONITRATE ER 30 MG PO TB24: 30.0000 mg | ORAL_TABLET | Freq: Every day | ORAL | 3 refills | Status: DC

## 2018-11-16 NOTE — Patient Instructions (Signed)
Medication Instructions:  1) START Imdur (Isosorbide) 30mg  once daily 2)  A prescription has been sent in for Nitroglycerin.  If you have chest pain that doesn't relieve quickly, place one tablet under your tongue and allow it to dissolve.  If no relief after 5 minutes, you may take another pill.  If no relief after 5 minutes, you may take a 3rd dose but you need to call 911 and report to ER immediately.  If you need a refill on your cardiac medications before your next appointment, please call your pharmacy.   Lab work: None If you have labs (blood work) drawn today and your tests are completely normal, you will receive your results only by: [name] MyChart Message (if you have MyChart) OR . A paper copy in the mail If you have any lab test that is abnormal or we need to change your treatment, we will call you to review the results.  Testing/Procedures: None  Follow-Up: At [facility], you and your health needs are our priority.  As part of our continuing mission to provide you with exceptional heart care, we have created designated Provider Care Teams.  These Care Teams include your primary Cardiologist (physician) and Advanced Practice Providers (APPs -  Physician Assistants and Nurse Practitioners) who all work together to provide you with the care you need, when you need it. You will need a follow up appointment in 1 months.  Please call our office 2 months in advance to schedule this appointment.  You may see Sinclair Grooms, MD or one of the following Advanced Practice Providers on your designated Care Team:   Truitt Merle, NP Cecilie Kicks, NP . Kathyrn Drown, NP  Any Other Special Instructions Will Be Listed Below (If Applicable).

## 2018-11-23 ENCOUNTER — Telehealth: Payer: Self-pay | Admitting: Interventional Cardiology

## 2018-11-23 NOTE — Telephone Encounter (Signed)
Spoke with wife, DPR on file.  She states since pt started Imdur 30mg  on 11/16/2018 he has been very fatigued.  States he use to get out and about everyday but has not been anywhere since starting med.  Chest pain has resolved.  BP has been fine.  Denies lightheadedness or dizziness.  Denies SOB.  Advised wife I would send to Dr. Tamala Julian to review but this could be coming from pt adjusting to new medication.  Advised to continue for now and see if this resolves.  Wife verbalized understanding and was appreciative for call.

## 2018-11-23 NOTE — Telephone Encounter (Signed)
° ° °  Patient's spouse calling to report fatigue. BP 117/70 No other symptoms

## 2018-11-24 NOTE — Telephone Encounter (Signed)
If chest better, continue Imdur. HA is usual side effect not fatigue. Okay to get out a do things. Give f/u after 2-4 weeks/.

## 2018-11-26 NOTE — Telephone Encounter (Signed)
Spoke with pt and made him aware of recommendations per Dr. Tamala Julian.  Pt states fatigue has improved since I spoke with his wife last week.  Pt has appt in March and states he will update [place] then if any changes.

## 2018-12-06 ENCOUNTER — Telehealth: Payer: Self-pay | Admitting: Interventional Cardiology

## 2018-12-06 DIAGNOSIS — I25118 Atherosclerotic heart disease of native coronary artery with other forms of angina pectoris: Secondary | ICD-10-CM

## 2018-12-06 DIAGNOSIS — R0789 Other chest pain: Secondary | ICD-10-CM

## 2018-12-06 NOTE — Telephone Encounter (Signed)
Spoke with wife (on [place]) who was recently seen in the office (2/7) for chest pain.  He was started on Isosorbide 30 mg daily and given a RX for SL Ntg prn.  Per wife, pt has been having chest pain still with walking and if he gets upset.  He has had so much chest pain that he has taken one full bottle of SL Ntg (#25) and has had it refilled.  Wife states he has no energy and does feel good.  Pt refuses to go to the ED and will not come into the office prior to his upcoming follow up appt with Dr Tamala Julian on 3/6.  Advised wife I will forward this information to Dr Tamala Julian and his nurse for their knowledge.  Also cautioned on using so much SL Ntg as it can cause a drop in blood pressure leading to falls etc.  Wife states understanding.

## 2018-12-06 NOTE — Telephone Encounter (Signed)
New Message   Pt c/o of Chest Pain: STAT if CP now or developed within 24 hours  1. Are you having CP right now? no  2. Are you experiencing any other symptoms (ex. SOB, nausea, vomiting, sweating)? fatigue  3. How long have you been experiencing CP? On and off for about 2 weeks   4. Is your CP continuous or coming and going? Coming and going   5. Have you taken Nitroglycerin? Yes   Pateints wife called to advise that patient is taking the nitroglycerin almost daily. He is refusing to go to the hospital and she is very concerned. ?

## 2018-12-08 NOTE — Telephone Encounter (Signed)
He needs coronary angiography sooner than the scheduled OV.

## 2018-12-10 ENCOUNTER — Other Ambulatory Visit: Payer: Medicare Other | Admitting: *Deleted

## 2018-12-10 ENCOUNTER — Encounter: Payer: Self-pay | Admitting: *Deleted

## 2018-12-10 DIAGNOSIS — R0789 Other chest pain: Secondary | ICD-10-CM

## 2018-12-10 DIAGNOSIS — I25118 Atherosclerotic heart disease of native coronary artery with other forms of angina pectoris: Secondary | ICD-10-CM

## 2018-12-10 LAB — CBC
Hematocrit: 37 % — ABNORMAL LOW (ref 37.5–51.0)
Hemoglobin: 12.8 g/dL — ABNORMAL LOW (ref 13.0–17.7)
MCH: 33.6 pg — ABNORMAL HIGH (ref 26.6–33.0)
MCHC: 34.6 g/dL (ref 31.5–35.7)
MCV: 97 fL (ref 79–97)
Platelets: 183 10*3/uL (ref 150–450)
RBC: 3.81 x10E6/uL — ABNORMAL LOW (ref 4.14–5.80)
RDW: 13 % (ref 11.6–15.4)
WBC: 5 10*3/uL (ref 3.4–10.8)

## 2018-12-10 LAB — BASIC METABOLIC PANEL
BUN/Creatinine Ratio: 17 (ref 10–24)
BUN: 16 mg/dL (ref 8–27)
CO2: 27 mmol/L (ref 20–29)
Calcium: 8.7 mg/dL (ref 8.6–10.2)
Chloride: 103 mmol/L (ref 96–106)
Creatinine, Ser: 0.94 mg/dL (ref 0.76–1.27)
GFR calc non Af Amer: 77 mL/min/{1.73_m2} (ref >59)
GFR, EST AFRICAN AMERICAN: 89 mL/min/{1.73_m2} (ref >59)
Glucose: 126 mg/dL — ABNORMAL HIGH (ref 65–99)
Potassium: 4.4 mmol/L (ref 3.5–5.2)
Sodium: 139 mmol/L (ref 134–144)

## 2018-12-10 NOTE — Telephone Encounter (Signed)
Routing to Desiree Lucy, RN, procedure nurse, for management of cath lab plan and orders

## 2018-12-10 NOTE — Telephone Encounter (Signed)
Spoke with patient about scheduling a cardiac catheterization this week per Dr. Thompson Caul advice. Patient verbalized understanding and agreement and is in agreement with plan; he is aware I will call him back with instructions.

## 2018-12-10 NOTE — Telephone Encounter (Signed)
Per Birdie Sons, can add patient to cath schedule for Dr Tamala Julian 12/11/18 arrive 7 AM for 9 AM LHC. Patient  is aware LHC has been scheduled for 12/11/18 arrive 7 AM for 9 AM procedure, pt to come to office this morning for STAT BMP/CBC, I will review instructions with patient  and provide written copy of instructions to patient when he comes for lab this morning.

## 2018-12-10 NOTE — Telephone Encounter (Signed)
Pt to office for lab, I reviewed instructions with patient and his wife, they verbalized understanding, written copy of instructions given to them.

## 2018-12-11 ENCOUNTER — Inpatient Hospital Stay (HOSPITAL_COMMUNITY): Payer: Medicare Other

## 2018-12-11 ENCOUNTER — Encounter (HOSPITAL_COMMUNITY): Payer: Self-pay | Admitting: Interventional Cardiology

## 2018-12-11 ENCOUNTER — Other Ambulatory Visit: Payer: Self-pay | Admitting: *Deleted

## 2018-12-11 ENCOUNTER — Encounter (HOSPITAL_COMMUNITY)
Admission: AD | Disposition: A | Discharge status: Home / Self Care | Payer: Self-pay | Source: Home / Self Care | Attending: Thoracic Surgery (Cardiothoracic Vascular Surgery)

## 2018-12-11 ENCOUNTER — Inpatient Hospital Stay (HOSPITAL_COMMUNITY)
Admission: AD | Admit: 2018-12-11 | Discharge: 2018-12-17 | DRG: 234 | Disposition: A | Discharge status: Home / Self Care | Payer: Medicare Other | Attending: Thoracic Surgery (Cardiothoracic Vascular Surgery) | Admitting: Thoracic Surgery (Cardiothoracic Vascular Surgery)

## 2018-12-11 ENCOUNTER — Other Ambulatory Visit: Payer: Self-pay

## 2018-12-11 DIAGNOSIS — G4733 Obstructive sleep apnea (adult) (pediatric): Secondary | ICD-10-CM | POA: Diagnosis present

## 2018-12-11 DIAGNOSIS — I351 Nonrheumatic aortic (valve) insufficiency: Secondary | ICD-10-CM | POA: Diagnosis not present

## 2018-12-11 DIAGNOSIS — I252 Old myocardial infarction: Secondary | ICD-10-CM

## 2018-12-11 DIAGNOSIS — I10 Essential (primary) hypertension: Secondary | ICD-10-CM | POA: Diagnosis present

## 2018-12-11 DIAGNOSIS — I25118 Atherosclerotic heart disease of native coronary artery with other forms of angina pectoris: Secondary | ICD-10-CM | POA: Diagnosis present

## 2018-12-11 DIAGNOSIS — N529 Male erectile dysfunction, unspecified: Secondary | ICD-10-CM | POA: Diagnosis present

## 2018-12-11 DIAGNOSIS — J9811 Atelectasis: Secondary | ICD-10-CM

## 2018-12-11 DIAGNOSIS — I2510 Atherosclerotic heart disease of native coronary artery without angina pectoris: Secondary | ICD-10-CM

## 2018-12-11 DIAGNOSIS — Z8249 Family history of ischemic heart disease and other diseases of the circulatory system: Secondary | ICD-10-CM | POA: Diagnosis not present

## 2018-12-11 DIAGNOSIS — K219 Gastro-esophageal reflux disease without esophagitis: Secondary | ICD-10-CM | POA: Diagnosis present

## 2018-12-11 DIAGNOSIS — R001 Bradycardia, unspecified: Secondary | ICD-10-CM | POA: Diagnosis present

## 2018-12-11 DIAGNOSIS — Z7902 Long term (current) use of antithrombotics/antiplatelets: Secondary | ICD-10-CM | POA: Diagnosis not present

## 2018-12-11 DIAGNOSIS — E876 Hypokalemia: Secondary | ICD-10-CM | POA: Diagnosis present

## 2018-12-11 DIAGNOSIS — T82855A Stenosis of coronary artery stent, initial encounter: Secondary | ICD-10-CM | POA: Diagnosis present

## 2018-12-11 DIAGNOSIS — Z79891 Long term (current) use of opiate analgesic: Secondary | ICD-10-CM | POA: Diagnosis not present

## 2018-12-11 DIAGNOSIS — Z8673 Personal history of transient ischemic attack (TIA), and cerebral infarction without residual deficits: Secondary | ICD-10-CM | POA: Diagnosis not present

## 2018-12-11 DIAGNOSIS — I200 Unstable angina: Secondary | ICD-10-CM | POA: Diagnosis present

## 2018-12-11 DIAGNOSIS — Z79899 Other long term (current) drug therapy: Secondary | ICD-10-CM

## 2018-12-11 DIAGNOSIS — I340 Nonrheumatic mitral (valve) insufficiency: Secondary | ICD-10-CM

## 2018-12-11 DIAGNOSIS — F419 Anxiety disorder, unspecified: Secondary | ICD-10-CM | POA: Diagnosis present

## 2018-12-11 DIAGNOSIS — E8770 Fluid overload, unspecified: Secondary | ICD-10-CM | POA: Diagnosis not present

## 2018-12-11 DIAGNOSIS — K2270 Barrett's esophagus without dysplasia: Secondary | ICD-10-CM | POA: Diagnosis present

## 2018-12-11 DIAGNOSIS — Z886 Allergy status to analgesic agent status: Secondary | ICD-10-CM | POA: Diagnosis not present

## 2018-12-11 DIAGNOSIS — Z951 Presence of aortocoronary bypass graft: Secondary | ICD-10-CM

## 2018-12-11 DIAGNOSIS — Y831 Surgical operation with implant of artificial internal device as the cause of abnormal reaction of the patient, or of later complication, without mention of misadventure at the time of the procedure: Secondary | ICD-10-CM | POA: Diagnosis present

## 2018-12-11 DIAGNOSIS — R079 Chest pain, unspecified: Secondary | ICD-10-CM

## 2018-12-11 DIAGNOSIS — Z01810 Encounter for preprocedural cardiovascular examination: Secondary | ICD-10-CM

## 2018-12-11 DIAGNOSIS — E785 Hyperlipidemia, unspecified: Secondary | ICD-10-CM | POA: Diagnosis present

## 2018-12-11 DIAGNOSIS — Z955 Presence of coronary angioplasty implant and graft: Secondary | ICD-10-CM

## 2018-12-11 DIAGNOSIS — D62 Acute posthemorrhagic anemia: Secondary | ICD-10-CM | POA: Diagnosis not present

## 2018-12-11 DIAGNOSIS — I25110 Atherosclerotic heart disease of native coronary artery with unstable angina pectoris: Secondary | ICD-10-CM | POA: Diagnosis not present

## 2018-12-11 HISTORY — PX: LEFT HEART CATH AND CORONARY ANGIOGRAPHY: CATH118249

## 2018-12-11 HISTORY — DX: Cerebral infarction, unspecified: I63.9

## 2018-12-11 HISTORY — DX: Presence of aortocoronary bypass graft: Z95.1

## 2018-12-11 LAB — BLOOD GAS, ARTERIAL
Acid-base deficit: 0.7 mmol/L (ref 0.0–2.0)
Bicarbonate: 23.1 mmol/L (ref 20.0–28.0)
DRAWN BY: 51185
FIO2: 21
O2 Saturation: 96.1 %
Patient temperature: 98.6
pCO2 arterial: 36.3 mmHg (ref 32.0–48.0)
pH, Arterial: 7.421 (ref 7.350–7.450)
pO2, Arterial: 82.6 mmHg — ABNORMAL LOW (ref 83.0–108.0)

## 2018-12-11 LAB — COMPREHENSIVE METABOLIC PANEL
ALT: 17 U/L (ref 0–44)
AST: 21 U/L (ref 15–41)
Albumin: 3.3 g/dL — ABNORMAL LOW (ref 3.5–5.0)
Alkaline Phosphatase: 81 U/L (ref 38–126)
Anion gap: 6 (ref 5–15)
BUN: 16 mg/dL (ref 8–23)
CALCIUM: 8.4 mg/dL — AB (ref 8.9–10.3)
CO2: 24 mmol/L (ref 22–32)
Chloride: 109 mmol/L (ref 98–111)
Creatinine, Ser: 1.05 mg/dL (ref 0.61–1.24)
GFR calc non Af Amer: >60 mL/min (ref >60)
Glucose, Bld: 139 mg/dL — ABNORMAL HIGH (ref 70–99)
Potassium: 3.5 mmol/L (ref 3.5–5.1)
Sodium: 139 mmol/L (ref 135–145)
Total Bilirubin: 0.7 mg/dL (ref 0.3–1.2)
Total Protein: 5.8 g/dL — ABNORMAL LOW (ref 6.5–8.1)

## 2018-12-11 LAB — URINALYSIS, COMPLETE (UACMP) WITH MICROSCOPIC
Bacteria, UA: NONE SEEN
Bilirubin Urine: NEGATIVE
Glucose, UA: NEGATIVE mg/dL
HGB URINE DIPSTICK: NEGATIVE
Ketones, ur: NEGATIVE mg/dL
Leukocytes,Ua: NEGATIVE
Nitrite: NEGATIVE
Protein, ur: NEGATIVE mg/dL
Specific Gravity, Urine: 1.009 (ref 1.005–1.030)
pH: 5 (ref 5.0–8.0)

## 2018-12-11 LAB — PULMONARY FUNCTION TEST
FEF 25-75 Post: 2.55 L/sec
FEF 25-75 Pre: 1.91 L/sec
FEF2575-%Change-Post: 33 %
FEF2575-%PRED-PRE: 96 %
FEF2575-%Pred-Post: 128 %
FEV1-%Change-Post: 11 %
FEV1-%Pred-Post: 82 %
FEV1-%Pred-Pre: 74 %
FEV1-Post: 2.33 L
FEV1-Pre: 2.1 L
FEV1FVC-%Change-Post: 5 %
FEV1FVC-%Pred-Pre: 107 %
FEV6-%Change-Post: 8 %
FEV6-%Pred-Post: 76 %
FEV6-%Pred-Pre: 71 %
FEV6-Post: 2.83 L
FEV6-Pre: 2.62 L
FEV6FVC-%Change-Post: 2 %
FEV6FVC-%Pred-Post: 106 %
FEV6FVC-%Pred-Pre: 103 %
FVC-%Change-Post: 5 %
FVC-%PRED-PRE: 68 %
FVC-%Pred-Post: 72 %
FVC-PRE: 2.71 L
FVC-Post: 2.85 L
POST FEV1/FVC RATIO: 82 %
Post FEV6/FVC ratio: 99 %
Pre FEV1/FVC ratio: 78 %
Pre FEV6/FVC Ratio: 97 %

## 2018-12-11 LAB — HEMOGLOBIN A1C
Hgb A1c MFr Bld: 5.7 % — ABNORMAL HIGH (ref 4.8–5.6)
MEAN PLASMA GLUCOSE: 116.89 mg/dL

## 2018-12-11 LAB — ECHOCARDIOGRAM COMPLETE
HEIGHTINCHES: 69 in
Weight: 2560 oz

## 2018-12-11 LAB — PROTIME-INR
INR: 1.1 (ref 0.8–1.2)
Prothrombin Time: 14.2 seconds (ref 11.4–15.2)

## 2018-12-11 LAB — ABO/RH: ABO/RH(D): O POS

## 2018-12-11 LAB — PREPARE RBC (CROSSMATCH)

## 2018-12-11 LAB — APTT: aPTT: 29 seconds (ref 24–36)

## 2018-12-11 LAB — PLATELET INHIBITION P2Y12: PLATELET FUNCTION P2Y12: 201 [PRU] (ref 182–335)

## 2018-12-11 LAB — PREALBUMIN: Prealbumin: 21 mg/dL (ref 18–38)

## 2018-12-11 SURGERY — LEFT HEART CATH AND CORONARY ANGIOGRAPHY
Anesthesia: LOCAL

## 2018-12-11 MED ORDER — PHENYLEPHRINE HCL-NACL 20-0.9 MG/250ML-% IV SOLN
30.0000 ug/min | INTRAVENOUS | Status: DC
  Filled 2018-12-11: qty 250

## 2018-12-11 MED ORDER — CLOPIDOGREL BISULFATE 75 MG PO TABS: 75.0000 mg | ORAL_TABLET | Freq: Once | ORAL | Status: DC

## 2018-12-11 MED ORDER — INSULIN REGULAR(HUMAN) IN NACL 100-0.9 UT/100ML-% IV SOLN
INTRAVENOUS | Status: DC
  Filled 2018-12-11: qty 100

## 2018-12-11 MED ORDER — ACETAMINOPHEN 325 MG PO TABS
650.0000 mg | ORAL_TABLET | ORAL | Status: DC | PRN
  Administered 2018-12-11: 650 mg via ORAL
  Filled 2018-12-11: qty 2

## 2018-12-11 MED ORDER — NOREPINEPHRINE 4 MG/250ML-% IV SOLN
0.0000 ug/min | INTRAVENOUS | Status: DC
  Filled 2018-12-11: qty 250

## 2018-12-11 MED ORDER — FENTANYL CITRATE (PF) 100 MCG/2ML IJ SOLN
INTRAMUSCULAR | Status: AC
  Filled 2018-12-11: qty 2

## 2018-12-11 MED ORDER — FENTANYL CITRATE (PF) 100 MCG/2ML IJ SOLN
INTRAMUSCULAR | Status: DC | PRN
  Administered 2018-12-11: 25 ug via INTRAVENOUS

## 2018-12-11 MED ORDER — SODIUM CHLORIDE 0.9% FLUSH: 3.0000 mL | INTRAVENOUS | Status: DC | PRN

## 2018-12-11 MED ORDER — SODIUM CHLORIDE 0.9 % WEIGHT BASED INFUSION: 1.0000 mL/kg/h | INTRAVENOUS | Status: AC

## 2018-12-11 MED ORDER — EPINEPHRINE PF 1 MG/ML IJ SOLN
0.0000 ug/min | INTRAVENOUS | Status: DC
  Filled 2018-12-11: qty 4

## 2018-12-11 MED ORDER — VITAMIN D 25 MCG (1000 UNIT) PO TABS
2000.0000 [IU] | ORAL_TABLET | Freq: Every day | ORAL | Status: DC
  Administered 2018-12-11: 2000 [IU] via ORAL
  Filled 2018-12-11: qty 2

## 2018-12-11 MED ORDER — PLASMA-LYTE 148 IV SOLN
INTRAVENOUS | Status: DC
  Filled 2018-12-11: qty 2.5

## 2018-12-11 MED ORDER — HEPARIN (PORCINE) IN NACL 1000-0.9 UT/500ML-% IV SOLN
INTRAVENOUS | Status: AC
  Filled 2018-12-11: qty 1000

## 2018-12-11 MED ORDER — SODIUM CHLORIDE 0.9 % IV SOLN: 250.0000 mL | INTRAVENOUS | Status: DC | PRN

## 2018-12-11 MED ORDER — MIDAZOLAM HCL 2 MG/2ML IJ SOLN
INTRAMUSCULAR | Status: DC | PRN
  Administered 2018-12-11: 1 mg via INTRAVENOUS

## 2018-12-11 MED ORDER — ALBUTEROL SULFATE (2.5 MG/3ML) 0.083% IN NEBU
2.5000 mg | INHALATION_SOLUTION | Freq: Once | RESPIRATORY_TRACT | Status: AC
  Administered 2018-12-11: 2.5 mg via RESPIRATORY_TRACT

## 2018-12-11 MED ORDER — DOPAMINE-DEXTROSE 3.2-5 MG/ML-% IV SOLN
0.0000 ug/kg/min | INTRAVENOUS | Status: DC
  Filled 2018-12-11: qty 250

## 2018-12-11 MED ORDER — NITROGLYCERIN 0.4 MG SL SUBL
0.4000 mg | SUBLINGUAL_TABLET | SUBLINGUAL | Status: DC | PRN
  Administered 2018-12-12 (×3): 0.4 mg via SUBLINGUAL
  Filled 2018-12-11 (×2): qty 1

## 2018-12-11 MED ORDER — TRANEXAMIC ACID (OHS) PUMP PRIME SOLUTION
2.0000 mg/kg | INTRAVENOUS | Status: DC
  Filled 2018-12-11: qty 1.45

## 2018-12-11 MED ORDER — IOHEXOL 350 MG/ML SOLN
INTRAVENOUS | Status: DC | PRN
  Administered 2018-12-11: 60 mL via INTRA_ARTERIAL

## 2018-12-11 MED ORDER — POTASSIUM CHLORIDE 2 MEQ/ML IV SOLN
80.0000 meq | INTRAVENOUS | Status: DC
  Filled 2018-12-11: qty 40

## 2018-12-11 MED ORDER — TRANEXAMIC ACID (OHS) BOLUS VIA INFUSION
15.0000 mg/kg | INTRAVENOUS | Status: DC
  Filled 2018-12-11: qty 1089

## 2018-12-11 MED ORDER — INSULIN REGULAR(HUMAN) IN NACL 100-0.9 UT/100ML-% IV SOLN: INTRAVENOUS | Status: DC

## 2018-12-11 MED ORDER — HEPARIN (PORCINE) 25000 UT/250ML-% IV SOLN
900.0000 [IU]/h | INTRAVENOUS | Status: DC
  Administered 2018-12-11: 900 [IU]/h via INTRAVENOUS
  Filled 2018-12-11 (×2): qty 250

## 2018-12-11 MED ORDER — TRANEXAMIC ACID (OHS) BOLUS VIA INFUSION
15.0000 mg/kg | INTRAVENOUS | Status: AC
  Administered 2018-12-12: 1089 mg via INTRAVENOUS
  Filled 2018-12-11: qty 1089

## 2018-12-11 MED ORDER — PANTOPRAZOLE SODIUM 20 MG PO TBEC
20.0000 mg | DELAYED_RELEASE_TABLET | Freq: Every day | ORAL | Status: DC
  Filled 2018-12-11: qty 1

## 2018-12-11 MED ORDER — SODIUM CHLORIDE 0.9 % IV SOLN
INTRAVENOUS | Status: DC
  Filled 2018-12-11: qty 30

## 2018-12-11 MED ORDER — CHLORHEXIDINE GLUCONATE 4 % EX LIQD
60.0000 mL | Freq: Once | CUTANEOUS | Status: AC
  Administered 2018-12-12: 4 via TOPICAL
  Filled 2018-12-11: qty 60

## 2018-12-11 MED ORDER — MAGNESIUM SULFATE 50 % IJ SOLN
40.0000 meq | INTRAMUSCULAR | Status: DC
  Filled 2018-12-11 (×2): qty 9.85

## 2018-12-11 MED ORDER — HEPARIN SODIUM (PORCINE) 1000 UNIT/ML IJ SOLN
INTRAMUSCULAR | Status: DC | PRN
  Administered 2018-12-11: 4000 [IU] via INTRAVENOUS

## 2018-12-11 MED ORDER — DEXMEDETOMIDINE HCL IN NACL 400 MCG/100ML IV SOLN
0.1000 ug/kg/h | INTRAVENOUS | Status: DC
  Filled 2018-12-11: qty 100

## 2018-12-11 MED ORDER — MAGNESIUM SULFATE 50 % IJ SOLN
40.0000 meq | INTRAMUSCULAR | Status: DC
  Filled 2018-12-11: qty 9.85

## 2018-12-11 MED ORDER — SODIUM CHLORIDE 0.9 % IV SOLN
750.0000 mg | INTRAVENOUS | Status: DC
  Filled 2018-12-11: qty 750

## 2018-12-11 MED ORDER — CHLORHEXIDINE GLUCONATE 4 % EX LIQD
60.0000 mL | Freq: Once | CUTANEOUS | Status: AC
  Administered 2018-12-11: 4 via TOPICAL
  Filled 2018-12-11: qty 60

## 2018-12-11 MED ORDER — VANCOMYCIN HCL 1000 MG IV SOLR
INTRAVENOUS | Status: DC
  Filled 2018-12-11: qty 1000

## 2018-12-11 MED ORDER — METOPROLOL TARTRATE 12.5 MG HALF TABLET
12.5000 mg | ORAL_TABLET | Freq: Two times a day (BID) | ORAL | Status: DC
  Filled 2018-12-11 (×2): qty 1

## 2018-12-11 MED ORDER — LIDOCAINE HCL (PF) 1 % IJ SOLN
INTRAMUSCULAR | Status: DC | PRN
  Administered 2018-12-11: 2 mL

## 2018-12-11 MED ORDER — VANCOMYCIN HCL 10 G IV SOLR
1250.0000 mg | INTRAVENOUS | Status: DC
  Filled 2018-12-11: qty 1250

## 2018-12-11 MED ORDER — SODIUM CHLORIDE 0.9 % IV SOLN
1.5000 g | INTRAVENOUS | Status: DC
  Filled 2018-12-11: qty 1.5

## 2018-12-11 MED ORDER — LISINOPRIL 20 MG PO TABS
40.0000 mg | ORAL_TABLET | Freq: Every day | ORAL | Status: DC
  Administered 2018-12-11: 40 mg via ORAL
  Filled 2018-12-11: qty 2

## 2018-12-11 MED ORDER — TRANEXAMIC ACID 1000 MG/10ML IV SOLN
1.5000 mg/kg/h | INTRAVENOUS | Status: DC
  Filled 2018-12-11: qty 25

## 2018-12-11 MED ORDER — MILRINONE LACTATE IN DEXTROSE 20-5 MG/100ML-% IV SOLN
0.3000 ug/kg/min | INTRAVENOUS | Status: DC
  Filled 2018-12-11: qty 100

## 2018-12-11 MED ORDER — LIDOCAINE HCL (PF) 1 % IJ SOLN
INTRAMUSCULAR | Status: AC
  Filled 2018-12-11: qty 30

## 2018-12-11 MED ORDER — NITROGLYCERIN IN D5W 200-5 MCG/ML-% IV SOLN: 2.0000 ug/min | INTRAVENOUS | Status: DC

## 2018-12-11 MED ORDER — HEPARIN SODIUM (PORCINE) 1000 UNIT/ML IJ SOLN
INTRAMUSCULAR | Status: AC
  Filled 2018-12-11: qty 1

## 2018-12-11 MED ORDER — TRANEXAMIC ACID 1000 MG/10ML IV SOLN
1.5000 mg/kg/h | INTRAVENOUS | Status: AC
  Administered 2018-12-12: 1.5 mg/kg/h via INTRAVENOUS
  Filled 2018-12-11: qty 25

## 2018-12-11 MED ORDER — SODIUM CHLORIDE 0.9 % IV SOLN
1.5000 g | INTRAVENOUS | Status: AC
  Administered 2018-12-12: 1.5 g via INTRAVENOUS
  Filled 2018-12-11: qty 1.5

## 2018-12-11 MED ORDER — SODIUM CHLORIDE 0.9% FLUSH: 3.0000 mL | Freq: Two times a day (BID) | INTRAVENOUS | Status: DC

## 2018-12-11 MED ORDER — SODIUM CHLORIDE 0.9 % WEIGHT BASED INFUSION
3.0000 mL/kg/h | INTRAVENOUS | Status: DC
  Administered 2018-12-11: 3 mL/kg/h via INTRAVENOUS

## 2018-12-11 MED ORDER — HYDROCHLOROTHIAZIDE 12.5 MG PO CAPS
12.5000 mg | ORAL_CAPSULE | Freq: Every day | ORAL | Status: DC
  Administered 2018-12-11: 12.5 mg via ORAL
  Filled 2018-12-11: qty 1

## 2018-12-11 MED ORDER — ATORVASTATIN CALCIUM 40 MG PO TABS
40.0000 mg | ORAL_TABLET | Freq: Every evening | ORAL | Status: DC
  Administered 2018-12-11 – 2018-12-14 (×3): 40 mg via ORAL
  Filled 2018-12-11 (×4): qty 1

## 2018-12-11 MED ORDER — NITROGLYCERIN IN D5W 200-5 MCG/ML-% IV SOLN
5.0000 ug/min | INTRAVENOUS | Status: DC
  Filled 2018-12-11 (×2): qty 250

## 2018-12-11 MED ORDER — VERAPAMIL HCL 2.5 MG/ML IV SOLN
INTRAVENOUS | Status: AC
  Filled 2018-12-11: qty 2

## 2018-12-11 MED ORDER — ONDANSETRON HCL 4 MG/2ML IJ SOLN: 4.0000 mg | Freq: Four times a day (QID) | INTRAMUSCULAR | Status: DC | PRN

## 2018-12-11 MED ORDER — SODIUM CHLORIDE 0.9 % IV SOLN
750.0000 mg | INTRAVENOUS | Status: AC
  Administered 2018-12-12: 750 mg via INTRAVENOUS
  Filled 2018-12-11: qty 750

## 2018-12-11 MED ORDER — TRAMADOL HCL 50 MG PO TABS: 50.0000 mg | ORAL_TABLET | Freq: Two times a day (BID) | ORAL | Status: DC | PRN

## 2018-12-11 MED ORDER — MILRINONE LACTATE IN DEXTROSE 20-5 MG/100ML-% IV SOLN: 0.3000 ug/kg/min | INTRAVENOUS | Status: DC

## 2018-12-11 MED ORDER — METOPROLOL TARTRATE 12.5 MG HALF TABLET
12.5000 mg | ORAL_TABLET | Freq: Once | ORAL | Status: AC
  Administered 2018-12-12: 12.5 mg via ORAL
  Filled 2018-12-11: qty 1

## 2018-12-11 MED ORDER — DIAZEPAM 2 MG PO TABS
2.0000 mg | ORAL_TABLET | Freq: Once | ORAL | Status: AC
  Administered 2018-12-12: 2 mg via ORAL
  Filled 2018-12-11: qty 1

## 2018-12-11 MED ORDER — SODIUM CHLORIDE 0.9 % WEIGHT BASED INFUSION: 1.0000 mL/kg/h | INTRAVENOUS | Status: DC

## 2018-12-11 MED ORDER — VANCOMYCIN HCL 10 G IV SOLR
1250.0000 mg | INTRAVENOUS | Status: AC
  Administered 2018-12-12: 1250 mg via INTRAVENOUS
  Filled 2018-12-11: qty 1250

## 2018-12-11 MED ORDER — OXYCODONE HCL 5 MG PO TABS: 5.0000 mg | ORAL_TABLET | ORAL | Status: DC | PRN

## 2018-12-11 MED ORDER — HEPARIN (PORCINE) IN NACL 1000-0.9 UT/500ML-% IV SOLN
INTRAVENOUS | Status: DC | PRN
  Administered 2018-12-11 (×2): 500 mL

## 2018-12-11 MED ORDER — BISACODYL 5 MG PO TBEC: 5.0000 mg | DELAYED_RELEASE_TABLET | Freq: Once | ORAL | Status: DC

## 2018-12-11 MED ORDER — TEMAZEPAM 15 MG PO CAPS
15.0000 mg | ORAL_CAPSULE | Freq: Once | ORAL | Status: AC | PRN
  Administered 2018-12-11: 15 mg via ORAL
  Filled 2018-12-11: qty 1

## 2018-12-11 MED ORDER — CHLORHEXIDINE GLUCONATE 0.12 % MT SOLN
15.0000 mL | Freq: Once | OROMUCOSAL | Status: AC
  Administered 2018-12-12: 15 mL via OROMUCOSAL
  Filled 2018-12-11: qty 15

## 2018-12-11 MED ORDER — ALPRAZOLAM 0.25 MG PO TABS
0.2500 mg | ORAL_TABLET | Freq: Two times a day (BID) | ORAL | Status: DC | PRN
  Administered 2018-12-11: 0.25 mg via ORAL
  Filled 2018-12-11: qty 1

## 2018-12-11 MED ORDER — VERAPAMIL HCL 2.5 MG/ML IV SOLN
INTRAVENOUS | Status: DC | PRN
  Administered 2018-12-11: 10 mL via INTRA_ARTERIAL

## 2018-12-11 MED ORDER — SODIUM CHLORIDE 0.9% FLUSH
3.0000 mL | Freq: Two times a day (BID) | INTRAVENOUS | Status: DC
  Administered 2018-12-11: 3 mL via INTRAVENOUS

## 2018-12-11 MED ORDER — MIDAZOLAM HCL 2 MG/2ML IJ SOLN
INTRAMUSCULAR | Status: AC
  Filled 2018-12-11: qty 2

## 2018-12-11 SURGICAL SUPPLY — 10 items
CATH 5FR JL3.5 JR4 ANG PIG MP (CATHETERS) ×1 IMPLANT
DEVICE RAD COMP TR BAND LRG (VASCULAR PRODUCTS) ×1 IMPLANT
GLIDESHEATH SLEND A-KIT 6F 22G (SHEATH) ×1 IMPLANT
GUIDEWIRE INQWIRE 1.5J.035X260 (WIRE) IMPLANT
INQWIRE 1.5J .035X260CM (WIRE) ×2
KIT HEART LEFT (KITS) ×2 IMPLANT
PACK CARDIAC CATHETERIZATION (CUSTOM PROCEDURE TRAY) ×2 IMPLANT
SHEATH PROBE COVER 6X72 (BAG) ×1 IMPLANT
TRANSDUCER W/STOPCOCK (MISCELLANEOUS) ×2 IMPLANT
TUBING CIL FLEX 10 FLL-RA (TUBING) ×2 IMPLANT

## 2018-12-11 NOTE — Consult Note (Addendum)
[address]       [place] [address]             [phone]        [place] [facility] Medical Record #[id number] Date of Birth: July 01, 1941  Referring: No ref. provider found Primary Care: Nicoletta Dress, MD Primary Cardiologist:Henry Carlye Grippe, MD  Chief Complaint:   Coronary artery disease  History of Present Illness:    The patient is a 78 year old male with a history of coronary artery disease dating back to 2014.  At that time he had an acute inferior myocardial infarction and underwent stenting of the right coronary artery in the ostial as well as mid vessel regions.  He was also found at that time to have complex circumflex disease that was not felt to demonstrate ischemia by scintography in 2015.  He has other significant risk factors including hyperlipidemia, hypertension and previous CVA.  Recently he has developed increasing angina symptoms.  He notes the symptoms while walking up inclines at fairly short distances which will lead to chest tightness.  There also symptoms associated with emotional upset.  The symptoms do improve with rest.  This is prompted cardiac catheterization which was done on today's date and he is found to have a very multivessel coronary artery disease (see report below) and we are asked to see the patient for consideration of coronary artery surgical revascularization.  The patient has been on Plavix and is noted to be allergic to aspirin.  The P2 Y 12 assay will be done to help establish timing of surgery if he is felt to be a candidate to proceed.    Current Activity/ Functional Status: Patient is independent with mobility/ambulation, transfers, ADL's, IADL's.   Zubrod Score: At the time of surgery this patient's most appropriate activity status/level should be described as: []     0    Normal activity, no symptoms [x]     1    Restricted in physical strenuous activity but ambulatory, able to do out light work []     2     Ambulatory and capable of self care, unable to do work activities, up and about                 more than 50%  Of the time                            []     3    Only limited self care, in bed greater than 50% of waking hours []     4    Completely disabled, no self care, confined to bed or chair []     5    Moribund  Past Medical History:  Diagnosis Date  . Ankle fracture, left 2009   rod placed  . Anxiety   . Aspirin allergy    a. Anaphylactic rxn.  . Barrett's esophagus   . CAD (coronary artery disease)    a. Inf STEMI 07/2013: s/p DES x 2 from ostial to [facility] 07/13/13. Residual Cx disease for med rx (PCI if recurrent pain).  . Dyslipidemia   . ED (erectile dysfunction)   . GERD (gastroesophageal reflux disease)   . History of nuclear stress test    Nuclear stress test 5/19: EF 53, no ischemia, no infarction; Low Risk  . Hydrocele of testis   . Hyperglycemia    a. A1C 5.8 in 07/2013.  [name]  Hypertension   . Myocardial infarction [facility])     Past Surgical History:  Procedure Laterality Date  . cataract surgery      bilateral   . CORONARY ANGIOPLASTY     stents   . HYDROCELE EXCISION Left 07/17/2014   Procedure: LEFT HYDROCELECTOMY;  Surgeon: Malka So, MD;  Location: [facility] ORS;  Service: Urology;  Laterality: Left;  . left ankle surgery  1999  . LEFT HEART CATHETERIZATION WITH CORONARY ANGIOGRAM N/A 07/13/2013   Procedure: LEFT HEART CATHETERIZATION WITH CORONARY ANGIOGRAM;  Surgeon: Sinclair Grooms, MD;  Location: [facility] C[facility] LAB;  Service: Cardiovascular;  Laterality: N/A;  . PERCUTANEOUS CORONARY STENT INTERVENTION (PCI-S)  07/13/2013   Procedure: PERCUTANEOUS CORONARY STENT INTERVENTION (PCI-S);  Surgeon: Sinclair Grooms, MD;  Location: [facility] CATH LAB;  Service: Cardiovascular;;    Social History   Tobacco Use  Smoking Status Never Smoker  Smokeless Tobacco Never Used    Social History   Substance and Sexual Activity  Alcohol Use Yes   Comment: occasional beer       Allergies  Allergen Reactions  . Aspirin Hives and Shortness Of Breath    "Shortness of Breath" can't breathe  . Quinine Derivatives Hives    "looks like he has the measles"    Current Facility-Administered Medications  Medication Dose Route Frequency Provider Last Rate Last Dose  . 0.9 %  sodium chloride infusion  250 mL Intravenous PRN Belva Crome, MD      . 0.9% sodium chloride infusion  1 mL/kg/hr Intravenous Continuous Belva Crome, MD 72.6 mL/hr at 12/11/18 0900 1 mL/kg/hr at 12/11/18 0900  . 0.9% sodium chloride infusion  1 mL/kg/hr Intravenous Continuous Belva Crome, MD 72.6 mL/hr at 12/11/18 0900 1 mL/kg/hr at 12/11/18 0900  . clopidogrel (PLAVIX) tablet 75 mg  75 mg Oral Once Belva Crome, MD      . sodium chloride flush (NS) 0.9 % injection 3 mL  3 mL Intravenous Q12H Belva Crome, MD      . sodium chloride flush (NS) 0.9 % injection 3 mL  3 mL Intravenous PRN Belva Crome, MD        Medications Prior to Admission  Medication Sig Dispense Refill Last Dose  . acetaminophen (TYLENOL) 500 MG tablet Take 1 tablet (500 mg total) by mouth every 6 (six) hours as needed for mild pain or headache. 30 tablet 0 Past Week at Unknown time  . ALPRAZolam (XANAX) 0.25 MG tablet Take 0.25 mg by mouth 2 (two) times daily as needed for anxiety.    Past Week at Unknown time  . atorvastatin (LIPITOR) 80 MG tablet Take 40 mg by mouth every evening.   5 12/10/2018 at Unknown time  . Cholecalciferol (VITAMIN D) 50 MCG (2000 UT) tablet Take 2,000 Units by mouth daily.   12/10/2018 at Unknown time  . clopidogrel (PLAVIX) 75 MG tablet TAKE 1 TABLET BY MOUTH EVERY DAY (Patient taking differently: Take 75 mg by mouth daily. ) 30 tablet 11 12/11/2018 at 0600  . hydrochlorothiazide (MICROZIDE) 12.5 MG capsule TAKE 1 CAPSULE BY MOUTH EVERY DAY (Patient taking differently: Take 12.5 mg by mouth daily. ) 30 capsule 11 12/10/2018 at Unknown time  . isosorbide mononitrate (IMDUR) 30 MG 24 hr tablet  Take 1 tablet (30 mg total) by mouth daily. 90 tablet 3 12/11/2018 at 0600  . lisinopril (PRINIVIL,ZESTRIL) 40 MG tablet Take 40 mg by mouth at bedtime.   12/10/2018 at  Unknown time  . metoprolol tartrate (LOPRESSOR) 25 MG tablet TAKE 1/2 TABLET BY MOUTH TWICE DAILY (Patient taking differently: Take 12.5 mg by mouth 2 (two) times daily. ) 30 tablet 9 12/11/2018 at 0600  . Multiple Vitamins-Minerals (SENIOR MULTIVITAMIN PLUS PO) Take 1 tablet by mouth every morning.    12/10/2018 at Unknown time  . nitroGLYCERIN (NITROSTAT) 0.4 MG SL tablet Place 1 tablet (0.4 mg total) under the tongue every 5 (five) minutes as needed for chest pain (up to 3 doses). 25 tablet 4 Past Week at Unknown time  . pantoprazole (PROTONIX) 20 MG tablet Take 20 mg by mouth daily.   1 12/11/2018 at 0600  . traMADol (ULTRAM) 50 MG tablet Take 50 mg by mouth 2 (two) times daily as needed for moderate pain.    Past Week at Unknown time    Family History  Problem Relation Age of Onset  . Hypertension Mother   . Stroke Neg Hx    Procedures   LEFT HEART CATH AND CORONARY ANGIOGRAPHY  Conclusion    Thrombotic/bulky calcified distal left main greater than 90%.  99% ostial circumflex.  Mid to distal circumflex 95%.  Ostial LAD 99%.  Mid LAD beyond the first large diagonal 90%.  First diagonal ostium to proximal 50 to 60%  RCA is heavily calcified from just inside the ostium to the mid vessel with generalized 25% in-stent restenosis.  Ostium contains 50 to 60% narrowing.  No distal disease is noted in the RCA.  Normal systolic function.  LVEDP 16 mmHg.  RECOMMENDATIONS:   The patient has been chronically on Plavix because he is allergic to aspirin.  He took Plavix this morning.  Plavix will be discontinued and a P2 Y 12 assay will be done.  Hospitalized and start IV heparin and nitroglycerin as the patient is having multiple episodes of angina daily with minimal activity and an occasional episode at rest.  Currently  pain-free.  Continue high intensity statin therapy.  TCTS consultation for surgery when appropriate based upon clinical judgment and the patient's status.  Recommendations   Antiplatelet/Anticoag Patient is allergic to aspirin. Plavix will be discontinued and IV heparin started. Post surgery, Plavix should be resumed  Indications   Unstable angina (HCC) [I20.0 (ICD-10-CM)]  Procedural Details   Technical Details The right radial area was sterilely prepped and draped. Intravenous sedation with Versed and fentanyl was administered. 1% Xylocaine was infiltrated to achieve local analgesia. Using real-time vascular ultrasound, a double wall stick with an angiocath was utilized to obtain intra-arterial access. A VUS image was saved for the record.The modified Seldinger technique was used to place a 80F " Slender" sheath in the right radial artery. Weight based heparin was administered. Coronary angiography was done using 5 F catheters. Right coronary angiography was performed with a JR4. Left ventricular hemodymic recordings and angiography was done using the JR 4 catheter and hand injection. Left coronary angiography was performed with a JL 3.5 cm.  Hemostasis was achieved using a pneumatic band.  During this procedure the patient is administered a total of Versed 1 mg and Fentanyl 25 mg to achieve and maintain moderate conscious sedation. The patient's heart rate, blood pressure, and oxygen saturation are monitored continuously during the procedure. The period of conscious sedation is 14 minutes, of which I was present face-to-face 100% of this time. Estimated blood loss <50 mL.   During this procedure medications were administered to achieve and maintain moderate conscious sedation while the patient's heart rate, blood  pressure, and oxygen saturation were continuously monitored and I was present face-to-face 100% of this time.  Medications  (Filter: Administrations occurring from 12/11/18 0820 to  12/11/18 0908)  Medication Rate/Dose/Volume Action  Date Time   midazolam (VERSED) injection (mg) 1 mg Given 12/11/18 0845   Total dose as of 12/11/18 1000        1 mg        fentaNYL (SUBLIMAZE) injection (mcg) 25 mcg Given 12/11/18 0845   Total dose as of 12/11/18 1000        25 mcg        lidocaine (PF) (XYLOCAINE) 1 % injection (mL) 2 mL Given 12/11/18 0847   Total dose as of 12/11/18 1000        2 mL        Heparin (Porcine) in NaCl 1000-0.9 UT/500ML-% SOLN (mL) 500 mL Given 12/11/18 0847   Total dose as of 12/11/18 1000 500 mL Given 0847   1,000 mL        Radial Cocktail/Verapamil only (mL) 10 mL Given 12/11/18 0848   Total dose as of 12/11/18 1000        10 mL        iohexol (OMNIPAQUE) 350 MG/ML injection (mL) 60 mL Given 12/11/18 0904   Total dose as of 12/11/18 1000        60 mL        heparin injection (Units) 4,000 Units Given 12/11/18 0850   Total dose as of 12/11/18 1000        4,000 Units        0.9% sodium chloride infusion (mL/kg/hr) 1 mL/kg/hr - 72.6 mL/hr Rate/Dose Change 12/11/18 0900   Dosing weight:  72.6 kg        Total dose as of 12/11/18 1000        73.31 mL        Sedation Time   Sedation Time Physician-1: 14 minutes 2 seconds  Coronary Findings   Diagnostic  Dominance: Right  Left Main  Mid LM to Dist LM lesion 90% stenosed  Mid LM to Dist LM lesion is 90% stenosed.  Dist LM to Ost LAD lesion 99% stenosed  Dist LM to Ost LAD lesion is 99% stenosed.  Left Anterior Descending  Prox LAD to Mid LAD lesion 90% stenosed  Prox LAD to Mid LAD lesion is 90% stenosed.  First Diagonal Branch  Ost 1st Diag lesion 60% stenosed  Ost 1st Diag lesion is 60% stenosed.  Left Circumflex  Ost Cx to Prox Cx lesion 99% stenosed  Ost Cx to Prox Cx lesion is 99% stenosed.  Mid Cx lesion 95% stenosed  Mid Cx lesion is 95% stenosed.  First Obtuse Marginal Branch  Vessel is small in size.  Second Obtuse Marginal Branch  Vessel is large in size.  Right Coronary  Artery  Ost RCA lesion 60% stenosed  Ost RCA lesion is 60% stenosed.  Ost RCA to Mid RCA lesion 25% stenosed  Ost RCA to Mid RCA lesion is 25% stenosed. The lesion was previously treated.  First Right Posterolateral  Vessel is small in size.  Second Right Posterolateral  Vessel is small in size.  Intervention   No interventions have been documented.  Wall Motion   Resting               Left Heart   Left Ventricle The left ventricular ejection fraction is 55-65% by visual estimate.  Coronary Diagrams   Diagnostic  Dominance: Right    Intervention   Implants    No implant documentation for this case.  Syngo Images   Show images for CARDIAC CATHETERIZATION  MERGE Images   Show images for CARDIAC CATHETERIZATION   Link to Procedure Log   Procedure Log    Hemo Data    Most Recent Value  AO Systolic Pressure 382 mmHg  AO Diastolic Pressure 55 mmHg  AO Mean 80 mmHg  LV Systolic Pressure 505 mmHg  LV Diastolic Pressure 7 mmHg  LV EDP 16 mmHg  AOp Systolic Pressure 397 mmHg  AOp Diastolic Pressure 58 mmHg  AOp Mean Pressure 83 mmHg  LVp Systolic Pressure 673 mmHg  LVp Diastolic Pressure 9 mmHg  LVp EDP Pressure 19 mmHg  Order-Level Documents:   There are no order-level documents.  Encounter-Level Documents - 12/11/2018:   Scan on 12/11/2018 9:06 AM by Default, Provider, MD  Electronic signature on 12/11/2018 6:50 AM - Signed  Electronic signature on 12/11/2018 6:50 AM - Signed      Signed   Electronically signed by Belva Crome, MD on 12/11/18 at [phone] EST     Review of Systems:   Review of Systems  Constitutional: Positive for malaise/fatigue. Negative for chills, diaphoresis, fever and weight loss.  HENT: Positive for hearing loss. Negative for congestion, ear discharge, ear pain, nosebleeds, sinus pain, sore throat and tinnitus.        Right ear deafness  Eyes: Negative for blurred vision, double vision, photophobia, pain, discharge and redness.   Respiratory: Negative for cough, hemoptysis, sputum production, shortness of breath and wheezing.   Cardiovascular: Positive for chest pain and leg swelling. Negative for palpitations, orthopnea, claudication and PND.  Gastrointestinal: Positive for constipation and heartburn. Negative for abdominal pain, blood in stool, diarrhea, melena, nausea and vomiting.       On protonix for indigestion  Genitourinary: Positive for urgency. Negative for dysuria, flank pain, frequency and hematuria.  Musculoskeletal: Positive for back pain. Negative for falls, joint pain, myalgias and neck pain.  Skin: Negative.   Neurological: Positive for tremors. Negative for dizziness, tingling, sensory change, speech change, focal weakness, seizures, loss of consciousness, weakness and headaches.  Endo/Heme/Allergies: Negative for environmental allergies and polydipsia. Bruises/bleeds easily.  Psychiatric/Behavioral: Positive for memory loss. Negative for depression, hallucinations, substance abuse and suicidal ideas. The patient is nervous/anxious. The patient does not have insomnia.       Physical Exam: BP 125/61   Pulse (!) 49   Temp 98.4 F (36.9 C) (Oral)   Resp 13   Ht 5\' 9"  (1.753 m)   Wt 72.6 kg   SpO2 100%   BMI 23.63 kg/m    Physical Exam  Constitutional: No distress.  HENT:  Nose: No nasal discharge.  Mouth/Throat: Dentition is normal. No dental caries. Oropharynx is clear. Pharynx is normal.  Eyes: Pupils are equal, round, and reactive to light. Conjunctivae are normal.  Neck: Normal range of motion and thyroid normal. Neck supple. No JVD present. No neck adenopathy. No thyromegaly present.  Cardiovascular: Regular rhythm, normal heart sounds, intact distal pulses and normal pulses. Exam reveals no gallop.  No murmur heard. No carotid bruits  Pulmonary/Chest: No stridor. He has no wheezes. He has no rales. He exhibits no tenderness.  Abdominal: Soft. Bowel sounds are normal. He exhibits no  distension and no mass. There is no hepatomegaly. There is no abdominal tenderness.  Musculoskeletal:        General: No tenderness, deformity or edema.  Neurological: He is alert and oriented to person, place, and time. He has normal motor skills.  Skin: Skin is dry. No rash noted. No cyanosis. No jaundice or pallor.    Diagnostic Studies & Laboratory data:     Recent Radiology Findings:   No results found.   I have independently reviewed the above radiologic studies and discussed with the patient   Recent Lab Findings: Lab Results  Component Value Date   WBC 5.0 12/10/2018   HGB 12.8 (L) 12/10/2018   HCT 37.0 (L) 12/10/2018   PLT 183 12/10/2018   GLUCOSE 126 (H) 12/10/2018   CHOL 108 03/20/2017   TRIG 87 03/20/2017   HDL 45 03/20/2017   LDLCALC 46 03/20/2017   ALT 21 03/20/2017   AST 27 03/20/2017   NA 139 12/10/2018   K 4.4 12/10/2018   CL 103 12/10/2018   CREATININE 0.94 12/10/2018   BUN 16 12/10/2018   CO2 27 12/10/2018   TSH 0.573 07/14/2013   INR 1.14 06/21/2016   HGBA1C 5.8 (H) 09/15/2015      Assessment / Plan: Severe multivessel coronary artery disease with accelerating anginal symptoms.  History of previous right coronary artery stenting.  History of previous myocardial infarction.  Hypertension Erectile dysfunction GERD Dyslipidemia Barrett's esophagus Aspirin allergy-anaphylaxis Current Plavix use, check P2 Y 12 assay  Plan: Multivessel CABG if all agree he is a suitable candidate.     John Giovanni, PA-C 12/11/2018 9:53 AM   I have seen and examined the patient and agree with the assessment as outlined above by Jadene Pierini, PA-C.  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  I have personally reviewed the patient's elective diagnostic cardiac catheterization performed earlier today by Dr. Tamala Julian which demonstrates critical left main and multivessel coronary artery disease with preserved left ventricular systolic function.  There is 90% distal left main stenosis with 99% ostial stenosis of left circumflex coronary artery, 95% stenosis of the circumflex at its bifurcation, and 99% ostial stenosis of the left anterior descending coronary artery.  The stents in the right coronary artery remained widely patent with perhaps 25% in-stent restenosis and mild ostial narrowing of the right coronary artery.  The patient has not experienced any symptoms of chest pain or chest tightness during or since his cardiac catheterization.  He takes Plavix on a chronic basis because of a reported aspirin allergy, but P2Y12 platelet function assay was normal, suggesting that the patient is not a responder to Plavix therapy.  I have reviewed the indications, risks, and potential benefits of coronary artery bypass grafting with the patient and his family at the bedside.  Alternative treatment strategies have been discussed, including the relative risks, benefits and long term prognosis associated with medical therapy, percutaneous coronary intervention, and surgical revascularization.  The patient understands and accepts all potential associated risks of surgery including but not limited to risk of death, stroke or other neurologic complication, myocardial infarction, congestive heart failure, respiratory failure, renal failure, bleeding requiring blood transfusion and/or reexploration, aortic dissection or other major vascular complication, arrhythmia, heart block or bradycardia requiring permanent pacemaker, pneumonia, pleural effusion, wound  infection,  pulmonary embolus or other thromboembolic complication, chronic pain or other delayed complications related to median sternotomy, or the late recurrence of symptomatic ischemic heart disease and/or congestive heart failure.  The importance of long term risk modification have been emphasized.  All questions answered.  We plan to proceed with coronary artery bypass grafting in the operating room tomorrow.   I spent in excess of 90 minutes during the conduct of this hospital encounter and >50% of this time involved direct face-to-face encounter with the patient for counseling and/or coordination of their care.   Rexene Alberts, MD 12/11/2018 2:54 PM

## 2018-12-11 NOTE — Anesthesia Preprocedure Evaluation (Addendum)
Anesthesia Evaluation  Patient identified by MRN, date of birth, ID band Patient awake    Reviewed: Allergy & Precautions, NPO status , Patient's Chart, lab work & pertinent test results, reviewed documented beta blocker date and time   Airway Mallampati: III  TM Distance: >3 FB Neck ROM: Full  Mouth opening: Limited Mouth Opening  Dental no notable dental hx.    Pulmonary sleep apnea ,    Pulmonary exam normal breath sounds clear to auscultation       Cardiovascular hypertension, Pt. on medications and Pt. on home beta blockers + angina + CAD, + Past MI and + Cardiac Stents  Normal cardiovascular exam Rhythm:Regular Rate:Normal  ECHO: 1. The left ventricle has normal systolic function with an ejection fraction of 60-65%. The cavity size was normal. Left ventricular diastolic Doppler parameters are consistent with impaired relaxation. 2. The right ventricle has normal systolic function. The cavity was normal. There is no increase in right ventricular wall thickness.  3. Right atrial size was severely dilated. 4. The mitral valve is normal in structure. 5. The tricuspid valve is normal in structure. 6. The aortic valve is normal in structure. Mild thickening of the aortic valve Mild calcification of the aortic valve. Aortic valve regurgitation is mild by color flow Doppler. 7. The pulmonic valve was normal in structure. 8. The inferior vena cava was dilated in size with >50% respiratory variability.  CATH: Thrombotic/bulky calcified distal left main greater than 90%. 99% ostial circumflex.  Mid to distal circumflex 95%. Ostial LAD 99%.  Mid LAD beyond the first large diagonal 90%.  First diagonal ostium to proximal 50 to 60% RCA is heavily calcified from just inside the ostium to the mid vessel with generalized 25% in-stent restenosis.  Ostium contains 50 to 60% narrowing.  No distal disease is noted in the RCA. Normal systolic  function.  LVEDP 16 mmHg.   Neuro/Psych Anxiety CVA, No Residual Symptoms    GI/Hepatic Neg liver ROS, GERD  Medicated and Controlled,  Endo/Other  negative endocrine ROS  Renal/GU negative Renal ROS     Musculoskeletal negative musculoskeletal ROS (+)   Abdominal   Peds  Hematology  (+) anemia , HLD   Anesthesia Other Findings CAD  Reproductive/Obstetrics                           Anesthesia Physical Anesthesia Plan  ASA: IV  Anesthesia Plan: General   Post-op Pain Management:    Induction: Intravenous  PONV Risk Score and Plan: 2 and Midazolam and Treatment may vary due to age or medical condition  Airway Management Planned: Oral ETT  Additional Equipment: Arterial line, CVP, PA Cath, TEE and Ultrasound Guidance Line Placement  Intra-op Plan:   Post-operative Plan: Post-operative intubation/ventilation  Informed Consent: I have reviewed the patients History and Physical, chart, labs and discussed the procedure including the risks, benefits and alternatives for the proposed anesthesia with the patient or authorized representative who has indicated his/her understanding and acceptance.     Dental advisory given  Plan Discussed with: CRNA  Anesthesia Plan Comments:        Anesthesia Quick Evaluation

## 2018-12-11 NOTE — Progress Notes (Signed)
Pre-CABG testing has been completed. Preliminary results can be found in CV Proc through chart review.   12/11/18 4:48 PM Carlos Levering RVT

## 2018-12-11 NOTE — Progress Notes (Signed)
Came to discuss sternal precautions, etc however surgeon just left and echo has been waiting. Left materials. Pt and family overwhelmed. Asked RN to check with them later for questions, etc.  [place], ACSM 2:49 PM 12/11/2018

## 2018-12-11 NOTE — Interval H&P Note (Signed)
Cath Lab Visit (complete for each Cath Lab visit)  Clinical Evaluation Leading to the Procedure:   ACS: Yes.    Non-ACS:    Anginal Classification: CCS III  Anti-ischemic medical therapy: Minimal Therapy (1 class of medications)  Non-Invasive Test Results: No non-invasive testing performed  Prior CABG: No previous CABG      History and Physical Interval Note:  12/11/2018 8:14 AM  Matthew Liu  has presented today for surgery, with the diagnosis of cad - cp  The various methods of treatment have been discussed with the patient and family. After consideration of risks, benefits and other options for treatment, the patient has consented to  Procedure(s): LEFT HEART CATH AND CORONARY ANGIOGRAPHY (N/A) as a surgical intervention .  The patient's history has been reviewed, patient examined, no change in status, stable for surgery.  I have reviewed the patient's chart and labs.  Questions were answered to the patient's satisfaction.     Belva Crome III

## 2018-12-11 NOTE — Progress Notes (Signed)
  Echocardiogram 2D Echocardiogram has been performed.  Madelaine Etienne 12/11/2018, 3:21 PM

## 2018-12-11 NOTE — Progress Notes (Signed)
ANTICOAGULATION CONSULT NOTE - Initial Consult  Pharmacy Consult for heparin Indication: chest pain/ACS  Allergies  Allergen Reactions  . Aspirin Hives and Shortness Of Breath    "Shortness of Breath" can't breathe  . Quinine Derivatives Hives    "looks like he has the measles"    Patient Measurements: Height: 5\' 9"  (175.3 cm) Weight: 160 lb (72.6 kg) IBW/kg (Calculated) : 70.7 Heparin Dosing Weight: 72kg  Vital Signs: Temp: 98.4 F (36.9 C) (03/03 0707) Temp Source: Oral (03/03 0707) BP: 129/47 (03/03 1310) Pulse Rate: 54 (03/03 1315)  Labs: Recent Labs    12/10/18 1100  HGB 12.8*  HCT 37.0*  PLT 183  CREATININE 0.94    Estimated Creatinine Clearance: 64.8 mL/min (by C-G formula based on SCr of 0.94 mg/dL).   Medical History: Past Medical History:  Diagnosis Date  . Ankle fracture, left 2009   rod placed  . Anxiety   . Aspirin allergy    a. Anaphylactic rxn.  . Barrett's esophagus   . CAD (coronary artery disease)    a. Inf STEMI 07/2013: s/p DES x 2 from ostial to [facility] 07/13/13. Residual Cx disease for med rx (PCI if recurrent pain).  . Dyslipidemia   . ED (erectile dysfunction)   . GERD (gastroesophageal reflux disease)   . History of nuclear stress test    Nuclear stress test 5/19: EF 53, no ischemia, no infarction; Low Risk  . Hydrocele of testis   . Hyperglycemia    a. A1C 5.8 in 07/2013.  [name] Hypertension   . Myocardial infarction [facility])      Assessment: 20 yoM on Plavix PTA due to known CAD and aspirin allergy admitted for heart cath. Pt found to have severe multivessel CAD, to start IV heparin while Plavix is held in anticipation of CABG workup. Pharmacy to start IV heparin 8hr after sheath pull.  Goal of Therapy:  Heparin level 0.3-0.7 units/ml Monitor platelets by anticoagulation protocol: Yes   Plan:  -Heparin 900 units/hr no bolus starting at 1700 -Check 8hr heparin level -Daily heparin level and CBC  Arrie Senate, PharmD,  BCPS Clinical Pharmacist [phone] Please check AMION for all [place] numbers 12/11/2018

## 2018-12-11 NOTE — Progress Notes (Signed)
T[facility] has been consulted for CABG evaluation.

## 2018-12-11 NOTE — Progress Notes (Signed)
TR BAND REMOVAL  LOCATION:    Radial rt radial  DEFLATED PER PROTOCOL:   yes  TIME BAND OFF / DRESSING APPLIED:    1245/gauze and tegaderm  SITE UPON ARRIVAL:    Level 0  SITE AFTER BAND REMOVAL:    Level 0  CIRCULATION SENSATION AND MOVEMENT:    Within Normal Limits :  Yes, rt arm elevated on pillow; rt hand and fingers warm and pink, brisk capillary refill, sensation present  COMMENTS:

## 2018-12-12 ENCOUNTER — Inpatient Hospital Stay (HOSPITAL_COMMUNITY): Payer: Medicare Other

## 2018-12-12 ENCOUNTER — Inpatient Hospital Stay (HOSPITAL_COMMUNITY): Payer: Medicare Other | Admitting: Anesthesiology

## 2018-12-12 ENCOUNTER — Encounter (HOSPITAL_COMMUNITY)
Admission: AD | Disposition: A | Discharge status: Home / Self Care | Payer: Self-pay | Source: Home / Self Care | Attending: Thoracic Surgery (Cardiothoracic Vascular Surgery)

## 2018-12-12 ENCOUNTER — Encounter (HOSPITAL_COMMUNITY): Payer: Self-pay | Admitting: Thoracic Surgery (Cardiothoracic Vascular Surgery)

## 2018-12-12 DIAGNOSIS — Z951 Presence of aortocoronary bypass graft: Secondary | ICD-10-CM

## 2018-12-12 HISTORY — PX: TEE WITHOUT CARDIOVERSION: SHX5443

## 2018-12-12 HISTORY — PX: CORONARY ARTERY BYPASS GRAFT: SHX141

## 2018-12-12 HISTORY — DX: Presence of aortocoronary bypass graft: Z95.1

## 2018-12-12 LAB — POCT I-STAT 7, (LYTES, BLD GAS, ICA,H+H)
Acid-Base Excess: 4 mmol/L — ABNORMAL HIGH (ref 0.0–2.0)
Acid-base deficit: 1 mmol/L (ref 0.0–2.0)
Acid-base deficit: 2 mmol/L (ref 0.0–2.0)
Acid-base deficit: 2 mmol/L (ref 0.0–2.0)
Acid-base deficit: 2 mmol/L (ref 0.0–2.0)
Bicarbonate: 27.2 mmol/L (ref 20.0–28.0)
Bicarbonate: 22.9 mmol/L (ref 20.0–28.0)
Bicarbonate: 22.8 mmol/L (ref 20.0–28.0)
Bicarbonate: 22.6 mmol/L (ref 20.0–28.0)
Bicarbonate: 23.2 mmol/L (ref 20.0–28.0)
Calcium, Ion: 0.98 mmol/L — ABNORMAL LOW (ref 1.15–1.40)
Calcium, Ion: 1.33 mmol/L (ref 1.15–1.40)
Calcium, Ion: 1.26 mmol/L (ref 1.15–1.40)
Calcium, Ion: 1.15 mmol/L (ref 1.15–1.40)
Calcium, Ion: 1.11 mmol/L — ABNORMAL LOW (ref 1.15–1.40)
HCT: 24 % — ABNORMAL LOW (ref 39.0–52.0)
HCT: 26 % — ABNORMAL LOW (ref 39.0–52.0)
HCT: 27 % — ABNORMAL LOW (ref 39.0–52.0)
HCT: 19 % — ABNORMAL LOW (ref 39.0–52.0)
HEMATOCRIT: 18 % — AB (ref 39.0–52.0)
Hemoglobin: 8.2 g/dL — ABNORMAL LOW (ref 13.0–17.0)
Hemoglobin: 8.8 g/dL — ABNORMAL LOW (ref 13.0–17.0)
Hemoglobin: 9.2 g/dL — ABNORMAL LOW (ref 13.0–17.0)
Hemoglobin: 6.5 g/dL — CL (ref 13.0–17.0)
Hemoglobin: 6.1 g/dL — CL (ref 13.0–17.0)
O2 SAT: 98 %
O2 Saturation: 100 %
O2 Saturation: 100 %
O2 Saturation: 97 %
O2 Saturation: 96 %
PCO2 ART: 34.6 mmHg (ref 32.0–48.0)
POTASSIUM: 4.2 mmol/L (ref 3.5–5.1)
Patient temperature: 35.5
Patient temperature: 36.4
Patient temperature: 36.8
Potassium: 4 mmol/L (ref 3.5–5.1)
Potassium: 4.3 mmol/L (ref 3.5–5.1)
Potassium: 4.6 mmol/L (ref 3.5–5.1)
Potassium: 4.5 mmol/L (ref 3.5–5.1)
Sodium: 140 mmol/L (ref 135–145)
Sodium: 140 mmol/L (ref 135–145)
Sodium: 140 mmol/L (ref 135–145)
Sodium: 139 mmol/L (ref 135–145)
Sodium: 139 mmol/L (ref 135–145)
TCO2: 28 mmol/L (ref 22–32)
TCO2: 24 mmol/L (ref 22–32)
TCO2: 24 mmol/L (ref 22–32)
TCO2: 24 mmol/L (ref 22–32)
TCO2: 24 mmol/L (ref 22–32)
pCO2 arterial: 34.1 mmHg (ref 32.0–48.0)
pCO2 arterial: 34.5 mmHg (ref 32.0–48.0)
pCO2 arterial: 36.8 mmHg (ref 32.0–48.0)
pCO2 arterial: 38.6 mmHg (ref 32.0–48.0)
pH, Arterial: 7.503 — ABNORMAL HIGH (ref 7.350–7.450)
pH, Arterial: 7.436 (ref 7.350–7.450)
pH, Arterial: 7.423 (ref 7.350–7.450)
pH, Arterial: 7.394 (ref 7.350–7.450)
pH, Arterial: 7.385 (ref 7.350–7.450)
pO2, Arterial: 344 mmHg — ABNORMAL HIGH (ref 83.0–108.0)
pO2, Arterial: 245 mmHg — ABNORMAL HIGH (ref 83.0–108.0)
pO2, Arterial: 104 mmHg (ref 83.0–108.0)
pO2, Arterial: 93 mmHg (ref 83.0–108.0)
pO2, Arterial: 85 mmHg (ref 83.0–108.0)

## 2018-12-12 LAB — POCT I-STAT 4, (NA,K, GLUC, HGB,HCT)
GLUCOSE: 104 mg/dL — AB (ref 70–99)
Glucose, Bld: 104 mg/dL — ABNORMAL HIGH (ref 70–99)
Glucose, Bld: 108 mg/dL — ABNORMAL HIGH (ref 70–99)
Glucose, Bld: 121 mg/dL — ABNORMAL HIGH (ref 70–99)
Glucose, Bld: 140 mg/dL — ABNORMAL HIGH (ref 70–99)
Glucose, Bld: 128 mg/dL — ABNORMAL HIGH (ref 70–99)
Glucose, Bld: 168 mg/dL — ABNORMAL HIGH (ref 70–99)
HCT: 31 % — ABNORMAL LOW (ref 39.0–52.0)
HCT: 25 % — ABNORMAL LOW (ref 39.0–52.0)
HCT: 29 % — ABNORMAL LOW (ref 39.0–52.0)
HCT: 22 % — ABNORMAL LOW (ref 39.0–52.0)
HCT: 24 % — ABNORMAL LOW (ref 39.0–52.0)
HCT: 18 % — ABNORMAL LOW (ref 39.0–52.0)
HEMATOCRIT: 27 % — AB (ref 39.0–52.0)
Hemoglobin: 10.5 g/dL — ABNORMAL LOW (ref 13.0–17.0)
Hemoglobin: 8.5 g/dL — ABNORMAL LOW (ref 13.0–17.0)
Hemoglobin: 9.9 g/dL — ABNORMAL LOW (ref 13.0–17.0)
Hemoglobin: 7.5 g/dL — ABNORMAL LOW (ref 13.0–17.0)
Hemoglobin: 8.2 g/dL — ABNORMAL LOW (ref 13.0–17.0)
Hemoglobin: 9.2 g/dL — ABNORMAL LOW (ref 13.0–17.0)
Hemoglobin: 6.1 g/dL — CL (ref 13.0–17.0)
Potassium: 4 mmol/L (ref 3.5–5.1)
Potassium: 4 mmol/L (ref 3.5–5.1)
Potassium: 4.9 mmol/L (ref 3.5–5.1)
Potassium: 4.7 mmol/L (ref 3.5–5.1)
Potassium: 4.4 mmol/L (ref 3.5–5.1)
Potassium: 4.3 mmol/L (ref 3.5–5.1)
Potassium: 4.6 mmol/L (ref 3.5–5.1)
Sodium: 140 mmol/L (ref 135–145)
Sodium: 140 mmol/L (ref 135–145)
Sodium: 140 mmol/L (ref 135–145)
Sodium: 140 mmol/L (ref 135–145)
Sodium: 139 mmol/L (ref 135–145)
Sodium: 140 mmol/L (ref 135–145)
Sodium: 139 mmol/L (ref 135–145)

## 2018-12-12 LAB — BASIC METABOLIC PANEL
Anion gap: 8 (ref 5–15)
Anion gap: 7 (ref 5–15)
BUN: 16 mg/dL (ref 8–23)
BUN: 13 mg/dL (ref 8–23)
CO2: 25 mmol/L (ref 22–32)
CO2: 21 mmol/L — ABNORMAL LOW (ref 22–32)
Calcium: 8.7 mg/dL — ABNORMAL LOW (ref 8.9–10.3)
Calcium: 7.9 mg/dL — ABNORMAL LOW (ref 8.9–10.3)
Chloride: 105 mmol/L (ref 98–111)
Chloride: 110 mmol/L (ref 98–111)
Creatinine, Ser: 1.03 mg/dL (ref 0.61–1.24)
Creatinine, Ser: 0.84 mg/dL (ref 0.61–1.24)
GFR calc Af Amer: >60 mL/min (ref >60)
GFR calc non Af Amer: >60 mL/min (ref >60)
GFR calc non Af Amer: >60 mL/min (ref >60)
Glucose, Bld: 104 mg/dL — ABNORMAL HIGH (ref 70–99)
Glucose, Bld: 138 mg/dL — ABNORMAL HIGH (ref 70–99)
Potassium: 3.9 mmol/L (ref 3.5–5.1)
Potassium: 4.4 mmol/L (ref 3.5–5.1)
Sodium: 138 mmol/L (ref 135–145)
Sodium: 138 mmol/L (ref 135–145)

## 2018-12-12 LAB — HEPARIN LEVEL (UNFRACTIONATED): Heparin Unfractionated: 0.24 IU/mL — ABNORMAL LOW (ref 0.30–0.70)

## 2018-12-12 LAB — CBC
HCT: 33.8 % — ABNORMAL LOW (ref 39.0–52.0)
HCT: 24 % — ABNORMAL LOW (ref 39.0–52.0)
HEMATOCRIT: 28.7 % — AB (ref 39.0–52.0)
Hemoglobin: 11.7 g/dL — ABNORMAL LOW (ref 13.0–17.0)
Hemoglobin: 9.8 g/dL — ABNORMAL LOW (ref 13.0–17.0)
Hemoglobin: 8 g/dL — ABNORMAL LOW (ref 13.0–17.0)
MCH: 34.1 pg — ABNORMAL HIGH (ref 26.0–34.0)
MCH: 33.8 pg (ref 26.0–34.0)
MCH: 33.3 pg (ref 26.0–34.0)
MCHC: 34.6 g/dL (ref 30.0–36.0)
MCHC: 34.1 g/dL (ref 30.0–36.0)
MCHC: 33.3 g/dL (ref 30.0–36.0)
MCV: 98.5 fL (ref 80.0–100.0)
MCV: 99 fL (ref 80.0–100.0)
MCV: 100 fL (ref 80.0–100.0)
Platelets: 152 10*3/uL (ref 150–400)
Platelets: 106 10*3/uL — ABNORMAL LOW (ref 150–400)
Platelets: 167 10*3/uL (ref 150–400)
RBC: 3.43 MIL/uL — ABNORMAL LOW (ref 4.22–5.81)
RBC: 2.9 MIL/uL — ABNORMAL LOW (ref 4.22–5.81)
RBC: 2.4 MIL/uL — AB (ref 4.22–5.81)
RDW: 12.8 % (ref 11.5–15.5)
RDW: 12.5 % (ref 11.5–15.5)
RDW: 12.5 % (ref 11.5–15.5)
WBC: 4.7 10*3/uL (ref 4.0–10.5)
WBC: 6 10*3/uL (ref 4.0–10.5)
WBC: 6.6 10*3/uL (ref 4.0–10.5)
nRBC: 0 % (ref 0.0–0.2)
nRBC: 0 % (ref 0.0–0.2)
nRBC: 0 % (ref 0.0–0.2)

## 2018-12-12 LAB — PLATELET COUNT: Platelets: 121 10*3/uL — ABNORMAL LOW (ref 150–400)

## 2018-12-12 LAB — GLUCOSE, CAPILLARY
GLUCOSE-CAPILLARY: 99 mg/dL (ref 70–99)
GLUCOSE-CAPILLARY: 141 mg/dL — AB (ref 70–99)
Glucose-Capillary: 108 mg/dL — ABNORMAL HIGH (ref 70–99)
Glucose-Capillary: 106 mg/dL — ABNORMAL HIGH (ref 70–99)
Glucose-Capillary: 129 mg/dL — ABNORMAL HIGH (ref 70–99)

## 2018-12-12 LAB — MAGNESIUM: Magnesium: 3.2 mg/dL — ABNORMAL HIGH (ref 1.7–2.4)

## 2018-12-12 LAB — ECHO INTRAOPERATIVE TEE
Height: 69 in
Weight: 2582.03 oz

## 2018-12-12 LAB — APTT: aPTT: 37 seconds — ABNORMAL HIGH (ref 24–36)

## 2018-12-12 LAB — PROTIME-INR
INR: 1.6 — ABNORMAL HIGH (ref 0.8–1.2)
Prothrombin Time: 18.5 seconds — ABNORMAL HIGH (ref 11.4–15.2)

## 2018-12-12 LAB — MRSA PCR SCREENING: MRSA by PCR: NEGATIVE

## 2018-12-12 LAB — HEMOGLOBIN AND HEMATOCRIT, BLOOD
HCT: 24.2 % — ABNORMAL LOW (ref 39.0–52.0)
HEMOGLOBIN: 8.1 g/dL — AB (ref 13.0–17.0)

## 2018-12-12 SURGERY — CORONARY ARTERY BYPASS GRAFTING (CABG)
Anesthesia: General | Site: Chest

## 2018-12-12 MED ORDER — TRAMADOL HCL 50 MG PO TABS: 50.0000 mg | ORAL_TABLET | ORAL | Status: DC | PRN

## 2018-12-12 MED ORDER — METOPROLOL TARTRATE 5 MG/5ML IV SOLN: 2.5000 mg | INTRAVENOUS | Status: DC | PRN

## 2018-12-12 MED ORDER — ROCURONIUM BROMIDE 10 MG/ML (PF) SYRINGE
PREFILLED_SYRINGE | INTRAVENOUS | Status: DC | PRN
  Administered 2018-12-12 (×4): 50 mg via INTRAVENOUS

## 2018-12-12 MED ORDER — ACETAMINOPHEN 160 MG/5ML PO SOLN: 1000.0000 mg | Freq: Four times a day (QID) | ORAL | Status: DC

## 2018-12-12 MED ORDER — INSULIN ASPART 100 UNIT/ML ~~LOC~~ SOLN
0.0000 [IU] | SUBCUTANEOUS | Status: DC
  Administered 2018-12-12 – 2018-12-13 (×3): 2 [IU] via SUBCUTANEOUS
  Administered 2018-12-13: 4 [IU] via SUBCUTANEOUS
  Administered 2018-12-13 (×2): 2 [IU] via SUBCUTANEOUS

## 2018-12-12 MED ORDER — SODIUM CHLORIDE 0.9% FLUSH: 10.0000 mL | INTRAVENOUS | Status: DC | PRN

## 2018-12-12 MED ORDER — PANTOPRAZOLE SODIUM 40 MG PO TBEC
40.0000 mg | DELAYED_RELEASE_TABLET | Freq: Every day | ORAL | Status: DC
  Administered 2018-12-14 – 2018-12-16 (×3): 40 mg via ORAL
  Filled 2018-12-12 (×3): qty 1

## 2018-12-12 MED ORDER — FENTANYL CITRATE (PF) 100 MCG/2ML IJ SOLN
INTRAMUSCULAR | Status: DC | PRN
  Administered 2018-12-12 (×4): 100 ug via INTRAVENOUS
  Administered 2018-12-12 (×2): 150 ug via INTRAVENOUS
  Administered 2018-12-12 (×2): 100 ug via INTRAVENOUS
  Administered 2018-12-12: 250 ug via INTRAVENOUS
  Administered 2018-12-12: 100 ug via INTRAVENOUS

## 2018-12-12 MED ORDER — INSULIN REGULAR BOLUS VIA INFUSION
0.0000 [IU] | Freq: Three times a day (TID) | INTRAVENOUS | Status: DC
  Filled 2018-12-12: qty 10

## 2018-12-12 MED ORDER — SODIUM CHLORIDE 0.9% IV SOLUTION
Freq: Once | INTRAVENOUS | Status: AC
  Administered 2018-12-12: 17:00:00 via INTRAVENOUS

## 2018-12-12 MED ORDER — SODIUM CHLORIDE 0.9 % IV SOLN: INTRAVENOUS | Status: DC

## 2018-12-12 MED ORDER — LACTATED RINGERS IV SOLN
INTRAVENOUS | Status: DC | PRN
  Administered 2018-12-12: 08:00:00 via INTRAVENOUS

## 2018-12-12 MED ORDER — ACETAMINOPHEN 160 MG/5ML PO SOLN: 650.0000 mg | Freq: Once | ORAL | Status: AC

## 2018-12-12 MED ORDER — ROCURONIUM BROMIDE 50 MG/5ML IV SOSY
PREFILLED_SYRINGE | INTRAVENOUS | Status: AC
  Filled 2018-12-12: qty 10

## 2018-12-12 MED ORDER — SODIUM CHLORIDE 0.9 % IV SOLN
INTRAVENOUS | Status: DC | PRN
  Administered 2018-12-12: .9 [IU]/h via INTRAVENOUS

## 2018-12-12 MED ORDER — BISACODYL 10 MG RE SUPP: 10.0000 mg | Freq: Every day | RECTAL | Status: DC

## 2018-12-12 MED ORDER — CHLORHEXIDINE GLUCONATE 0.12 % MT SOLN
15.0000 mL | OROMUCOSAL | Status: AC
  Administered 2018-12-12: 15 mL via OROMUCOSAL

## 2018-12-12 MED ORDER — CALCIUM CHLORIDE 10 % IV SOLN
INTRAVENOUS | Status: DC | PRN
  Administered 2018-12-12: 200 mg via INTRAVENOUS
  Administered 2018-12-12: 800 mg via INTRAVENOUS

## 2018-12-12 MED ORDER — ONDANSETRON HCL 4 MG/2ML IJ SOLN: 4.0000 mg | Freq: Four times a day (QID) | INTRAMUSCULAR | Status: DC | PRN

## 2018-12-12 MED ORDER — LACTATED RINGERS IV SOLN
INTRAVENOUS | Status: DC | PRN
  Administered 2018-12-12 (×2): via INTRAVENOUS

## 2018-12-12 MED ORDER — SODIUM CHLORIDE 0.45 % IV SOLN: INTRAVENOUS | Status: DC | PRN

## 2018-12-12 MED ORDER — SODIUM CHLORIDE 0.9% FLUSH
10.0000 mL | Freq: Two times a day (BID) | INTRAVENOUS | Status: DC
  Administered 2018-12-13 – 2018-12-14 (×4): 10 mL

## 2018-12-12 MED ORDER — LACTATED RINGERS IV SOLN: 500.0000 mL | Freq: Once | INTRAVENOUS | Status: DC | PRN

## 2018-12-12 MED ORDER — 0.9 % SODIUM CHLORIDE (POUR BTL) OPTIME
TOPICAL | Status: DC | PRN
  Administered 2018-12-12: 6000 mL

## 2018-12-12 MED ORDER — METOPROLOL TARTRATE 25 MG/10 ML ORAL SUSPENSION
12.5000 mg | Freq: Two times a day (BID) | ORAL | Status: DC
  Administered 2018-12-12: 12.5 mg
  Filled 2018-12-12: qty 5

## 2018-12-12 MED ORDER — MIDAZOLAM HCL (PF) 10 MG/2ML IJ SOLN
INTRAMUSCULAR | Status: AC
  Filled 2018-12-12: qty 2

## 2018-12-12 MED ORDER — PHENYLEPHRINE 40 MCG/ML (10ML) SYRINGE FOR IV PUSH (FOR BLOOD PRESSURE SUPPORT): PREFILLED_SYRINGE | INTRAVENOUS | Status: DC | PRN

## 2018-12-12 MED ORDER — CHLORHEXIDINE GLUCONATE 0.12% ORAL RINSE (MEDLINE KIT)
15.0000 mL | Freq: Two times a day (BID) | OROMUCOSAL | Status: DC
  Administered 2018-12-12: 15 mL via OROMUCOSAL

## 2018-12-12 MED ORDER — PHENYLEPHRINE HCL-NACL 20-0.9 MG/250ML-% IV SOLN: 0.0000 ug/min | INTRAVENOUS | Status: DC

## 2018-12-12 MED ORDER — OXYCODONE HCL 5 MG PO TABS
5.0000 mg | ORAL_TABLET | ORAL | Status: DC | PRN
  Administered 2018-12-13: 5 mg via ORAL
  Administered 2018-12-13: 10 mg via ORAL
  Administered 2018-12-14: 5 mg via ORAL
  Filled 2018-12-12: qty 2
  Filled 2018-12-12 (×2): qty 1

## 2018-12-12 MED ORDER — VANCOMYCIN HCL IN DEXTROSE 1-5 GM/200ML-% IV SOLN
1000.0000 mg | Freq: Once | INTRAVENOUS | Status: AC
  Administered 2018-12-12: 1000 mg via INTRAVENOUS
  Filled 2018-12-12: qty 200

## 2018-12-12 MED ORDER — ORAL CARE MOUTH RINSE
15.0000 mL | OROMUCOSAL | Status: DC
  Administered 2018-12-12 (×2): 15 mL via OROMUCOSAL

## 2018-12-12 MED ORDER — ACETAMINOPHEN 650 MG RE SUPP
650.0000 mg | Freq: Once | RECTAL | Status: AC
  Administered 2018-12-12: 650 mg via RECTAL

## 2018-12-12 MED ORDER — FENTANYL CITRATE (PF) 250 MCG/5ML IJ SOLN
INTRAMUSCULAR | Status: AC
  Filled 2018-12-12: qty 25

## 2018-12-12 MED ORDER — BISACODYL 5 MG PO TBEC
10.0000 mg | DELAYED_RELEASE_TABLET | Freq: Every day | ORAL | Status: DC
  Administered 2018-12-13 – 2018-12-15 (×3): 10 mg via ORAL
  Filled 2018-12-12 (×3): qty 2

## 2018-12-12 MED ORDER — FAMOTIDINE IN NACL 20-0.9 MG/50ML-% IV SOLN
20.0000 mg | Freq: Two times a day (BID) | INTRAVENOUS | Status: AC
  Administered 2018-12-12 (×2): 20 mg via INTRAVENOUS
  Filled 2018-12-12: qty 50

## 2018-12-12 MED ORDER — SODIUM CHLORIDE 0.9 % IV SOLN
INTRAVENOUS | Status: DC | PRN
  Administered 2018-12-12: 20 ug/min via INTRAVENOUS

## 2018-12-12 MED ORDER — NITROGLYCERIN IN D5W 200-5 MCG/ML-% IV SOLN
INTRAVENOUS | Status: DC | PRN
  Administered 2018-12-12: 5 ug/min via INTRAVENOUS

## 2018-12-12 MED ORDER — ACETAMINOPHEN 500 MG PO TABS
1000.0000 mg | ORAL_TABLET | Freq: Four times a day (QID) | ORAL | Status: DC
  Administered 2018-12-13 – 2018-12-16 (×14): 1000 mg via ORAL
  Filled 2018-12-12 (×13): qty 2

## 2018-12-12 MED ORDER — INSULIN REGULAR(HUMAN) IN NACL 100-0.9 UT/100ML-% IV SOLN: INTRAVENOUS | Status: DC

## 2018-12-12 MED ORDER — MAGNESIUM SULFATE 4 GM/100ML IV SOLN
4.0000 g | Freq: Once | INTRAVENOUS | Status: AC
  Administered 2018-12-12: 4 g via INTRAVENOUS
  Filled 2018-12-12: qty 100

## 2018-12-12 MED ORDER — CHLORHEXIDINE GLUCONATE CLOTH 2 % EX PADS: 6.0000 | MEDICATED_PAD | Freq: Every day | CUTANEOUS | Status: DC

## 2018-12-12 MED ORDER — SODIUM CHLORIDE 0.9 % IV SOLN
INTRAVENOUS | Status: DC | PRN
  Administered 2018-12-12: 50 ug/min via INTRAVENOUS

## 2018-12-12 MED ORDER — METOPROLOL TARTRATE 12.5 MG HALF TABLET
12.5000 mg | ORAL_TABLET | Freq: Two times a day (BID) | ORAL | Status: DC
  Administered 2018-12-13 – 2018-12-15 (×4): 12.5 mg via ORAL
  Filled 2018-12-12 (×4): qty 1

## 2018-12-12 MED ORDER — SODIUM CHLORIDE 0.9 % IV SOLN
1.5000 g | Freq: Two times a day (BID) | INTRAVENOUS | Status: AC
  Administered 2018-12-12 – 2018-12-14 (×4): 1.5 g via INTRAVENOUS
  Filled 2018-12-12 (×4): qty 1.5

## 2018-12-12 MED ORDER — SODIUM CHLORIDE 0.9 % IV SOLN
INTRAVENOUS | Status: DC
  Administered 2018-12-12: 14:00:00 via INTRAVENOUS

## 2018-12-12 MED ORDER — ONDANSETRON HCL 4 MG/2ML IJ SOLN
INTRAMUSCULAR | Status: DC | PRN
  Administered 2018-12-12: 4 mg via INTRAVENOUS

## 2018-12-12 MED ORDER — SODIUM CHLORIDE 0.9% FLUSH: 3.0000 mL | INTRAVENOUS | Status: DC | PRN

## 2018-12-12 MED ORDER — SODIUM CHLORIDE 0.9 % IV SOLN: 250.0000 mL | INTRAVENOUS | Status: DC

## 2018-12-12 MED ORDER — MORPHINE SULFATE (PF) 2 MG/ML IV SOLN: 1.0000 mg | INTRAVENOUS | Status: DC | PRN

## 2018-12-12 MED ORDER — ONDANSETRON HCL 4 MG/2ML IJ SOLN
INTRAMUSCULAR | Status: AC
  Filled 2018-12-12: qty 2

## 2018-12-12 MED ORDER — PLASMA-LYTE 148 IV SOLN
INTRAVENOUS | Status: DC | PRN
  Administered 2018-12-12: 500 mL via INTRAVASCULAR

## 2018-12-12 MED ORDER — SODIUM CHLORIDE (PF) 0.9 % IJ SOLN
OROMUCOSAL | Status: DC | PRN
  Administered 2018-12-12 (×3): 4 mL via TOPICAL

## 2018-12-12 MED ORDER — SODIUM CHLORIDE 0.9% FLUSH
10.0000 mL | Freq: Two times a day (BID) | INTRAVENOUS | Status: DC
  Administered 2018-12-12: 10 mL

## 2018-12-12 MED ORDER — LACTATED RINGERS IV SOLN: INTRAVENOUS | Status: DC

## 2018-12-12 MED ORDER — DEXMEDETOMIDINE HCL IN NACL 200 MCG/50ML IV SOLN: 0.0000 ug/kg/h | INTRAVENOUS | Status: DC

## 2018-12-12 MED ORDER — PROPOFOL 10 MG/ML IV BOLUS
INTRAVENOUS | Status: AC
  Filled 2018-12-12: qty 20

## 2018-12-12 MED ORDER — DOCUSATE SODIUM 100 MG PO CAPS
200.0000 mg | ORAL_CAPSULE | Freq: Every day | ORAL | Status: DC
  Administered 2018-12-13 – 2018-12-15 (×3): 200 mg via ORAL
  Filled 2018-12-12 (×3): qty 2

## 2018-12-12 MED ORDER — ALBUMIN HUMAN 5 % IV SOLN
250.0000 mL | INTRAVENOUS | Status: DC | PRN
  Administered 2018-12-12 – 2018-12-13 (×3): 12.5 g via INTRAVENOUS
  Filled 2018-12-12: qty 250

## 2018-12-12 MED ORDER — EPHEDRINE 5 MG/ML INJ
INTRAVENOUS | Status: AC
  Filled 2018-12-12: qty 10

## 2018-12-12 MED ORDER — EPHEDRINE SULFATE 50 MG/ML IJ SOLN
INTRAMUSCULAR | Status: DC | PRN
  Administered 2018-12-12 (×2): 10 mg via INTRAVENOUS
  Administered 2018-12-12: 5 mg via INTRAVENOUS

## 2018-12-12 MED ORDER — VANCOMYCIN HCL 1000 MG IV SOLR
INTRAVENOUS | Status: DC | PRN
  Administered 2018-12-12: 1000 mL

## 2018-12-12 MED ORDER — PROTAMINE SULFATE 10 MG/ML IV SOLN
INTRAVENOUS | Status: DC | PRN
  Administered 2018-12-12: 240 mg via INTRAVENOUS
  Administered 2018-12-12: 20 mg via INTRAVENOUS

## 2018-12-12 MED ORDER — PROPOFOL 10 MG/ML IV BOLUS
INTRAVENOUS | Status: DC | PRN
  Administered 2018-12-12: 50 mg via INTRAVENOUS

## 2018-12-12 MED ORDER — MIDAZOLAM HCL 5 MG/5ML IJ SOLN
INTRAMUSCULAR | Status: DC | PRN
  Administered 2018-12-12: 1 mg via INTRAVENOUS
  Administered 2018-12-12: 4 mg via INTRAVENOUS
  Administered 2018-12-12: 2 mg via INTRAVENOUS

## 2018-12-12 MED ORDER — CHLORHEXIDINE GLUCONATE CLOTH 2 % EX PADS
6.0000 | MEDICATED_PAD | Freq: Every day | CUTANEOUS | Status: DC
  Administered 2018-12-13 – 2018-12-15 (×3): 6 via TOPICAL

## 2018-12-12 MED ORDER — POTASSIUM CHLORIDE 10 MEQ/50ML IV SOLN: 10.0000 meq | INTRAVENOUS | Status: AC

## 2018-12-12 MED ORDER — ALBUMIN HUMAN 5 % IV SOLN
INTRAVENOUS | Status: DC | PRN
  Administered 2018-12-12 (×2): via INTRAVENOUS

## 2018-12-12 MED ORDER — ROCURONIUM BROMIDE 50 MG/5ML IV SOSY
PREFILLED_SYRINGE | INTRAVENOUS | Status: AC
  Filled 2018-12-12: qty 5

## 2018-12-12 MED ORDER — HEPARIN SODIUM (PORCINE) 1000 UNIT/ML IJ SOLN
INTRAMUSCULAR | Status: DC | PRN
  Administered 2018-12-12: 26000 [IU] via INTRAVENOUS

## 2018-12-12 MED ORDER — NITROGLYCERIN IN D5W 200-5 MCG/ML-% IV SOLN: 0.0000 ug/min | INTRAVENOUS | Status: DC

## 2018-12-12 MED ORDER — MIDAZOLAM HCL 2 MG/2ML IJ SOLN: 2.0000 mg | INTRAMUSCULAR | Status: DC | PRN

## 2018-12-12 MED ORDER — SODIUM CHLORIDE 0.9% FLUSH
3.0000 mL | Freq: Two times a day (BID) | INTRAVENOUS | Status: DC
  Administered 2018-12-13 – 2018-12-16 (×8): 3 mL via INTRAVENOUS

## 2018-12-12 MED ORDER — DEXMEDETOMIDINE HCL IN NACL 200 MCG/50ML IV SOLN
INTRAVENOUS | Status: DC | PRN
  Administered 2018-12-12: .3 ug/kg/h via INTRAVENOUS

## 2018-12-12 SURGICAL SUPPLY — 103 items
BAG DECANTER FOR FLEXI CONT (MISCELLANEOUS) ×6 IMPLANT
BANDAGE ACE 4X5 VEL STRL LF (GAUZE/BANDAGES/DRESSINGS) ×3 IMPLANT
BANDAGE ACE 6X5 VEL STRL LF (GAUZE/BANDAGES/DRESSINGS) ×3 IMPLANT
BANDAGE ELASTIC 4 VELCRO ST LF (GAUZE/BANDAGES/DRESSINGS) ×3 IMPLANT
BANDAGE ELASTIC 6 VELCRO ST LF (GAUZE/BANDAGES/DRESSINGS) ×3 IMPLANT
BASKET HEART (ORDER IN 25'S) (MISCELLANEOUS) ×1
BASKET HEART (ORDER IN 25S) (MISCELLANEOUS) ×2 IMPLANT
BLADE CLIPPER SURG (BLADE) IMPLANT
BLADE STERNUM SYSTEM 6 (BLADE) ×3 IMPLANT
BLADE SURG 11 STRL SS (BLADE) ×3 IMPLANT
BNDG GAUZE ELAST 4 BULKY (GAUZE/BANDAGES/DRESSINGS) ×3 IMPLANT
CANISTER SUCT 3000ML PPV (MISCELLANEOUS) ×3 IMPLANT
CANNULA EZ GLIDE AORTIC 21FR (CANNULA) ×3 IMPLANT
CATH CPB KIT OWEN (MISCELLANEOUS) ×3 IMPLANT
CATH THORACIC 36FR (CATHETERS) ×3 IMPLANT
CLIP RETRACTION 3.0MM CORONARY (MISCELLANEOUS) ×3 IMPLANT
CLIP VESOCCLUDE MED 24/CT (CLIP) IMPLANT
CLIP VESOCCLUDE SM WIDE 24/CT (CLIP) IMPLANT
CONN ST 1/4X3/8  BEN (MISCELLANEOUS) ×1
CONN ST 1/4X3/8 BEN (MISCELLANEOUS) ×2 IMPLANT
COVER WAND RF STERILE (DRAPES) IMPLANT
CRADLE DONUT ADULT HEAD (MISCELLANEOUS) ×3 IMPLANT
DRAIN CHANNEL 32F RND 10.7 FF (WOUND CARE) ×6 IMPLANT
DRAPE CARDIOVASCULAR INCISE (DRAPES) ×3
DRAPE INCISE IOBAN 66X45 STRL (DRAPES) IMPLANT
DRAPE SLUSH/WARMER DISC (DRAPES) ×3 IMPLANT
DRAPE SRG 135X102X78XABS (DRAPES) ×2 IMPLANT
DRSG AQUACEL AG ADV 3.5X14 (GAUZE/BANDAGES/DRESSINGS) ×3 IMPLANT
DRSG COVADERM 4X14 (GAUZE/BANDAGES/DRESSINGS) ×3 IMPLANT
ELECT BLADE 4.0 EZ CLEAN MEGAD (MISCELLANEOUS) ×3
ELECT REM PT RETURN 9FT ADLT (ELECTROSURGICAL) ×6
ELECTRODE BLDE 4.0 EZ CLN MEGD (MISCELLANEOUS) ×2 IMPLANT
ELECTRODE REM PT RTRN 9FT ADLT (ELECTROSURGICAL) ×4 IMPLANT
FELT TEFLON 1X6 (MISCELLANEOUS) ×6 IMPLANT
GAUZE SPONGE 4X4 12PLY STRL (GAUZE/BANDAGES/DRESSINGS) ×6 IMPLANT
GAUZE SPONGE 4X4 12PLY STRL LF (GAUZE/BANDAGES/DRESSINGS) ×6 IMPLANT
GLOVE BIO SURGEON STRL SZ 6.5 (GLOVE) ×21 IMPLANT
GLOVE BIOGEL PI IND STRL 7.0 (GLOVE) ×6 IMPLANT
GLOVE BIOGEL PI INDICATOR 7.0 (GLOVE) ×3
GLOVE ORTHO TXT STRL SZ7.5 (GLOVE) ×6 IMPLANT
GOWN STRL REUS W/ TWL LRG LVL3 (GOWN DISPOSABLE) ×8 IMPLANT
GOWN STRL REUS W/TWL LRG LVL3 (GOWN DISPOSABLE) ×4
HEMOSTAT POWDER SURGIFOAM 1G (HEMOSTASIS) ×9 IMPLANT
INSERT FOGARTY XLG (MISCELLANEOUS) ×3 IMPLANT
KIT BASIN OR (CUSTOM PROCEDURE TRAY) ×3 IMPLANT
KIT SUCTION CATH 14FR (SUCTIONS) ×9 IMPLANT
KIT TURNOVER KIT B (KITS) ×3 IMPLANT
KIT VASOVIEW HEMOPRO 2 VH 4000 (KITS) ×3 IMPLANT
LEAD PACING MYOCARDI (MISCELLANEOUS) ×3 IMPLANT
MARKER GRAFT CORONARY BYPASS (MISCELLANEOUS) ×12 IMPLANT
NS IRRIG 1000ML POUR BTL (IV SOLUTION) ×15 IMPLANT
PACK E OPEN HEART (SUTURE) ×3 IMPLANT
PACK OPEN HEART (CUSTOM PROCEDURE TRAY) ×3 IMPLANT
PAD ARMBOARD 7.5X6 YLW CONV (MISCELLANEOUS) ×6 IMPLANT
PAD ELECT DEFIB RADIOL ZOLL (MISCELLANEOUS) ×3 IMPLANT
PENCIL BUTTON HOLSTER BLD 10FT (ELECTRODE) ×3 IMPLANT
PUNCH AORTIC ROTATE 4.0MM (MISCELLANEOUS) ×3 IMPLANT
PUNCH AORTIC ROTATE 4.5MM 8IN (MISCELLANEOUS) IMPLANT
PUNCH AORTIC ROTATE 5MM 8IN (MISCELLANEOUS) IMPLANT
SET CARDIOPLEGIA MPS 5001102 (MISCELLANEOUS) ×3 IMPLANT
SOLUTION ANTI FOG 6CC (MISCELLANEOUS) ×3 IMPLANT
SPONGE LAP 18X18 RF (DISPOSABLE) ×3 IMPLANT
SPONGE LAP 4X18 RFD (DISPOSABLE) ×3 IMPLANT
SUT BONE WAX W31G (SUTURE) ×3 IMPLANT
SUT ETHIBOND X763 2 0 SH 1 (SUTURE) ×6 IMPLANT
SUT MNCRL AB 3-0 PS2 18 (SUTURE) ×6 IMPLANT
SUT MNCRL AB 4-0 PS2 18 (SUTURE) ×3 IMPLANT
SUT PDS AB 1 CTX 36 (SUTURE) ×6 IMPLANT
SUT PROLENE 2 0 SH DA (SUTURE) IMPLANT
SUT PROLENE 3 0 SH DA (SUTURE) ×6 IMPLANT
SUT PROLENE 3 0 SH1 36 (SUTURE) IMPLANT
SUT PROLENE 4 0 RB 1 (SUTURE) ×1
SUT PROLENE 4 0 SH DA (SUTURE) ×6 IMPLANT
SUT PROLENE 4-0 RB1 .5 CRCL 36 (SUTURE) ×2 IMPLANT
SUT PROLENE 5 0 C 1 36 (SUTURE) IMPLANT
SUT PROLENE 6 0 C 1 30 (SUTURE) IMPLANT
SUT PROLENE 7.0 RB 3 (SUTURE) ×33 IMPLANT
SUT PROLENE 8 0 BV175 6 (SUTURE) ×6 IMPLANT
SUT PROLENE BLUE 7 0 (SUTURE) ×6 IMPLANT
SUT PROLENE POLY MONO (SUTURE) ×3 IMPLANT
SUT SILK  1 MH (SUTURE) ×2
SUT SILK 1 MH (SUTURE) ×4 IMPLANT
SUT STEEL 6MS V (SUTURE) IMPLANT
SUT STEEL STERNAL CCS#1 18IN (SUTURE) ×3 IMPLANT
SUT STEEL SZ 6 DBL 3X14 BALL (SUTURE) ×6 IMPLANT
SUT VIC AB 1 CTX 36 (SUTURE)
SUT VIC AB 1 CTX36XBRD ANBCTR (SUTURE) IMPLANT
SUT VIC AB 2-0 CT1 27 (SUTURE) ×3
SUT VIC AB 2-0 CT1 TAPERPNT 27 (SUTURE) ×2 IMPLANT
SUT VIC AB 2-0 CTX 27 (SUTURE) IMPLANT
SUT VIC AB 3-0 SH 27 (SUTURE)
SUT VIC AB 3-0 SH 27X BRD (SUTURE) IMPLANT
SUT VIC AB 3-0 X1 27 (SUTURE) IMPLANT
SUT VICRYL 4-0 PS2 18IN ABS (SUTURE) IMPLANT
SYSTEM SAHARA CHEST DRAIN ATS (WOUND CARE) ×3 IMPLANT
TAPE CLOTH SURG 4X10 WHT LF (GAUZE/BANDAGES/DRESSINGS) ×3 IMPLANT
TOWEL GREEN STERILE (TOWEL DISPOSABLE) ×3 IMPLANT
TOWEL GREEN STERILE FF (TOWEL DISPOSABLE) ×3 IMPLANT
TRAY FOLEY SLVR 16FR TEMP STAT (SET/KITS/TRAYS/PACK) ×3 IMPLANT
TUBE SUCT INTRACARD DLP 20F (MISCELLANEOUS) ×3 IMPLANT
TUBING LAP HI FLOW INSUFFLATIO (TUBING) ×3 IMPLANT
UNDERPAD 30X30 (UNDERPADS AND DIAPERS) ×3 IMPLANT
WATER STERILE IRR 1000ML POUR (IV SOLUTION) ×6 IMPLANT

## 2018-12-12 NOTE — Progress Notes (Signed)
  Echocardiogram Echocardiogram Transesophageal has been performed.  Jannett Celestine 12/12/2018, 1:41 PM

## 2018-12-12 NOTE — Anesthesia Procedure Notes (Signed)
Arterial Line Insertion Start/End3/01/2019 7:50 AM, 12/12/2018 7:55 AM Performed by: Glynda Jaeger, CRNA, CRNA  Preanesthetic checklist: patient identified, IV checked, site marked, risks and benefits discussed, surgical consent, monitors and equipment checked, pre-op evaluation, timeout performed and anesthesia consent Lidocaine 1% used for infiltration Left, radial was placed Catheter size: 20 G Hand hygiene performed  and maximum sterile barriers used  Allen's test indicative of satisfactory collateral circulation Attempts: 1 Procedure performed without using ultrasound guided technique. Following insertion, dressing applied and Biopatch. Post procedure assessment: normal  Patient tolerated the procedure well with no immediate complications.

## 2018-12-12 NOTE — Brief Op Note (Signed)
12/11/2018 - 12/12/2018  1:45 PM  PATIENT:  Matthew Liu  78 y.o. male  PRE-OPERATIVE DIAGNOSIS:  CAD  POST-OPERATIVE DIAGNOSIS:  CAD  PROCEDURE:  Procedure(s): CORONARY ARTERY BYPASS GRAFTING (CABG) (N/A) TRANSESOPHAGEAL ECHOCARDIOGRAM (TEE) (N/A)  LIMA to LAD SVG to OM 2 and OM 3 SVG to Diag 1 SVG to distal RCA  SURGEON:  Surgeon(s) and Role:    * Rexene Alberts, MD - Primary  PHYSICIAN ASSISTANT:  Nicholes Rough, PA-C   ANESTHESIA:   general  EBL:  700 mL   BLOOD ADMINISTERED:none  DRAINS: ROUTINE   LOCAL MEDICATIONS USED:  NONE  SPECIMEN:  No Specimen  DISPOSITION OF SPECIMEN:  N/A  COUNTS:  YES  DICTATION: .Dragon Dictation  PLAN OF CARE: Admit to inpatient   PATIENT DISPOSITION:  ICU - intubated and hemodynamically stable.   Delay start of Pharmacological VTE agent (>24hrs) due to surgical blood loss or risk of bleeding: yes

## 2018-12-12 NOTE — Transfer of Care (Signed)
Immediate Anesthesia Transfer of Care Note  Patient: Matthew Liu  Procedure(s) Performed: CORONARY ARTERY BYPASS GRAFTING (CABG) (N/A Chest) TRANSESOPHAGEAL ECHOCARDIOGRAM (TEE) (N/A )  Patient Location: ICU  Anesthesia Type:General  Level of Consciousness: Patient remains intubated per anesthesia plan  Airway & Oxygen Therapy: Patient remains intubated per anesthesia plan and Patient placed on Ventilator (see vital sign flow sheet for setting)  Post-op Assessment: Report given to RN and Post -op Vital signs reviewed and stable  Post vital signs: Reviewed and stable  Last Vitals:  Vitals Value Taken Time  BP 115/76 12/12/2018  2:20 PM  Temp 35.7 C 12/12/2018  2:21 PM  Pulse 79 12/12/2018  2:21 PM  Resp 19 12/12/2018  2:21 PM  SpO2 97 % 12/12/2018  2:21 PM  Vitals shown include unvalidated device data.  Last Pain:  Vitals:   12/12/18 0513  TempSrc: Oral  PainSc:       Patients Stated Pain Goal: 5 (71/16/57 9038)  Complications: No apparent anesthesia complications

## 2018-12-12 NOTE — Anesthesia Postprocedure Evaluation (Signed)
Anesthesia Post Note  Patient: Matthew Liu  Procedure(s) Performed: CORONARY ARTERY BYPASS GRAFTING (CABG) (N/A Chest) TRANSESOPHAGEAL ECHOCARDIOGRAM (TEE) (N/A )     Patient location during evaluation: SICU Anesthesia Type: General Level of consciousness: sedated Pain management: pain level controlled Vital Signs Assessment: post-procedure vital signs reviewed and stable Respiratory status: patient remains intubated per anesthesia plan Cardiovascular status: stable Postop Assessment: no apparent nausea or vomiting Anesthetic complications: no    Last Vitals:  Vitals:   12/12/18 1700 12/12/18 1715  BP: 100/69 120/76  Pulse: 61 80  Resp: 12 12  Temp: (!) 36 C (!) 36.1 C  SpO2: 100% 99%    Last Pain:  Vitals:   12/12/18 1715  TempSrc: Core (Comment)  PainSc:                  Karyl Kinnier Samiyyah Moffa

## 2018-12-12 NOTE — Progress Notes (Signed)
      [address]       [place] [address]             [phone]     CARDIOTHORACIC SURGERY PROGRESS NOTE  Subjective: Matthew Liu has been scheduled for Procedure(s): CORONARY ARTERY BYPASS GRAFTING (CABG) (N/A) TRANSESOPHAGEAL ECHOCARDIOGRAM (TEE) (N/A) today.   Objective: Vital signs in last 24 hours: Temp:  [97.6 F (36.4 C)-98.4 F (36.9 C)] 97.6 F (36.4 C) (03/04 0513) Pulse Rate:  [32-151] 66 (03/04 0513) Cardiac Rhythm: Sinus bradycardia (03/03 2000) Resp:  [10-26] 15 (03/04 0513) BP: (112-158)/(45-94) 127/82 (03/04 0513) SpO2:  [96 %-100 %] 99 % (03/04 0513) Weight:  [72.6 kg-73.2 kg] 73.2 kg (03/04 0539)  Physical Exam: Unchanged from previously   Intake/Output from previous day: 03/03 0701 - 03/04 0700 In: -  Out: 1050 [Urine:1050] Intake/Output this shift: Total I/O In: -  Out: 1050 [Urine:1050]  Lab Results: Recent Labs    12/10/18 1100 12/12/18 0213  WBC 5.0 4.7  HGB 12.8* 11.7*  HCT 37.0* 33.8*  PLT 183 152   BMET:  Recent Labs    12/11/18 1533 12/12/18 0213  NA 139 138  K 3.5 3.9  CL 109 105  CO2 24 25  GLUCOSE 139* 104*  BUN 16 16  CREATININE 1.05 1.03  CALCIUM 8.4* 8.7*    CBG (last 3)  No results for input(s): GLUCAP in the last 72 hours. PT/INR:   Recent Labs    12/11/18 1533  LABPROT 14.2  INR 1.1    Assessment/Plan:   The various methods of treatment have been discussed with the patient. After consideration of the risks, benefits and treatment options the patient has consented to the planned procedure.   The patient has been seen and labs reviewed. There are no changes in the patient's condition to prevent proceeding with the planned procedure today.   Rexene Alberts, MD 12/12/2018 5:58 AM

## 2018-12-12 NOTE — Op Note (Signed)
CARDIOTHORACIC SURGERY OPERATIVE NOTE  Date of Procedure: 12/12/2018  Preoperative Diagnosis:   Severe 3-vessel Coronary Artery Disease  Unstable Angina Pectoris  Postoperative Diagnosis: Same  Procedure:    Coronary Artery Bypass Grafting x 5   Left Internal Mammary Artery to Distal Left Anterior Descending Coronary Artery  Saphenous Vein Graft to Distal Right Coronary Artery  Saphenous Vein Graft to Second Obtuse Marginal Branch of Left Circumflex Coronary Artery  Sequential Saphenous Vein Graft to Third Obtuse Marginal Branch of Left Circumflex Coronary Artery  Sapheonous Vein Graft to Diagonal Branch Coronary Artery  Endoscopic Vein Harvest from Right Thigh and Lower Leg  Surgeon: Valentina Gu. 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  I have personally reviewed the patient's elective diagnostic cardiac catheterization performed earlier today by Dr. Tamala Julian which demonstrates critical left main and multivessel coronary artery disease with preserved left ventricular systolic function.  There is 90% distal left main stenosis with 99% ostial stenosis of left circumflex coronary artery, 95% stenosis of the circumflex at its  bifurcation, and 99% ostial stenosis of the left anterior descending coronary artery.  The stents in the right coronary artery remained widely patent with perhaps 25% in-stent restenosis and mild ostial narrowing of the right coronary artery.  The patient has not experienced any symptoms of chest pain or chest tightness during or since his cardiac catheterization.  The patient has been seen in consultation and counseled at length regarding the indications, risks and potential benefits of surgery.  All questions have been answered, and the patient provides full informed consent for the operation as described.   DETAILS OF THE OPERATIVE PROCEDURE  Preparation:  The patient is brought to the operating room on the above mentioned date and central monitoring was established by the anesthesia team including placement of Swan-Ganz catheter and radial arterial line. The patient is placed in the supine position on the operating table.  Intravenous antibiotics are administered. General endotracheal anesthesia is induced uneventfully. A Foley catheter is placed.  Baseline transesophageal echocardiogram was performed.  Findings were notable for normal LV systolic function.  There was trace central aortic insufficiency and mild mitral regurgitation.  The patient's chest, abdomen, both groins, and both lower extremities are prepared and draped in a sterile manner. A time out procedure is performed.   Surgical Approach and Conduit Harvest:  A median sternotomy incision was performed and the left internal mammary artery is dissected from the chest wall and prepared for bypass grafting. The left internal mammary artery is notably good quality conduit. Simultaneously, the greater saphenous vein is obtained from the patient's right thigh and lower leg using endoscopic vein harvest technique. The saphenous vein is notably fair quality conduit. After removal of the saphenous vein, the small surgical incisions in the  lower extremity are closed with absorbable suture. Following systemic heparinization, the  left internal mammary artery was transected distally noted to have excellent flow.   Extracorporeal Cardiopulmonary Bypass and Myocardial Protection:  The pericardium is opened. The ascending aorta is mildly sclerotic in appearance. The ascending aorta and the right atrium are cannulated for cardiopulmonary bypass.  Adequate heparinization is verified.    The entire pre-bypass portion of the operation was notable for stable hemodynamics.  Cardiopulmonary bypass was begun and the surface of the heart is inspected. Distal target vessels are selected for coronary artery bypass grafting. A cardioplegia cannula is placed in the ascending aorta.  A temperature probe was placed in the interventricular septum.  The patient is allowed to cool passively to [facility] systemic temperature.  The aortic cross clamp is applied and cold blood cardioplegia is delivered initially in an antegrade fashion through the aortic root.  Iced saline slush is applied for topical hypothermia.  The initial cardioplegic arrest is rapid with early diastolic arrest.  Repeat doses of cardioplegia are administered intermittently throughout the entire cross clamp portion of the operation through the aortic root and through subsequently placed vein grafts in order to maintain completely flat electrocardiogram and septal myocardial temperature below 15C.  Myocardial protection was felt to be excellent.  Coronary Artery Bypass Grafting:   The distal right coronary artery was grafted using a reversed saphenous vein graft in an end-to-side fashion.  At the site of distal anastomosis the target vessel was fair quality and measured approximately 2.0 mm in diameter.  The second obtuse marginal branch of the left circumflex coronary artery was grafted using a reversed saphenous vein graft in an side-to-side fashion.  At the site of distal anastomosis the  target vessel was good quality and measured approximately 1.7 mm in diameter.  The third obtuse marginal branch of the left circumflex coronary artery was grafted in and end-to-side fashion using a sequential saphenous vein graft off of the distal segment of the vein graft placed to the second obtuse marginal branch coronary artery.  At the site of distal anastomosis the target vessel was good quality and measured approximately 1.4 mm in diameter.  The diagonal branch of the left anterior descending coronary artery was grafted using a reversed saphenous vein graft in an end-to-side fashion.  At the site of distal anastomosis the target vessel was good quality and measured approximately 1.5 mm in diameter.  The distal left anterior coronary artery was grafted with the left internal mammary artery in an end-to-side fashion.  At the site of distal anastomosis the target vessel was good quality and measured approximately 2.0 mm in diameter.  All proximal vein graft anastomoses were placed directly to the ascending aorta prior to removal of the aortic cross clamp.  The septal myocardial temperature rose rapidly after reperfusion of the left internal mammary artery graft.  The aortic cross clamp was removed after a total cross clamp time of 91 minutes.   Procedure Completion:  All proximal and distal coronary anastomoses were inspected for hemostasis and appropriate graft orientation. Epicardial pacing wires are fixed to the right ventricular outflow tract and to the right atrial appendage. The patient is rewarmed to 37C temperature. The patient is weaned and disconnected from cardiopulmonary bypass.  The patient's rhythm at separation from bypass was sinus.  The patient was weaned from cardiopulmonary bypass without any inotropic support. Total cardiopulmonary bypass time for the operation was 112 minutes.  Followup transesophageal echocardiogram performed after separation from bypass revealed no changes  from the preoperative exam.  The aortic and  venous cannula were removed uneventfully. Protamine was administered to reverse the anticoagulation. The mediastinum and pleural space were inspected for hemostasis and irrigated with saline solution. The mediastinum and the left pleural space were drained using 3 chest tubes placed through separate stab incisions inferiorly.  The soft tissues anterior to the aorta were reapproximated loosely. The sternum is closed with double strength sternal wire. The soft tissues anterior to the sternum were closed in multiple layers and the skin is closed with a running subcuticular skin closure.  The post-bypass portion of the operation was notable for stable rhythm and hemodynamics.  No blood products were administered during the operation.   Disposition:  The patient tolerated the procedure well and is transported to the surgical intensive care in stable condition. There are no intraoperative complications. All sponge instrument and needle counts are verified correct at completion of the operation.    Valentina Gu. Roxy Manns MD 12/12/2018 1:49 PM

## 2018-12-12 NOTE — Anesthesia Procedure Notes (Signed)
Procedure Name: Intubation Date/Time: 12/12/2018 8:50 AM Performed by: Glynda Jaeger, CRNA Pre-anesthesia Checklist: Patient identified, Patient being monitored, Timeout performed, Emergency Drugs available and Suction available Patient Re-evaluated:Patient Re-evaluated prior to induction Oxygen Delivery Method: Circle System Utilized Preoxygenation: Pre-oxygenation with 100% oxygen Induction Type: IV induction Ventilation: Mask ventilation without difficulty Laryngoscope Size: Mac and 4 Grade View: Grade II Tube type: Oral Tube size: 8.0 mm Number of attempts: 1 Airway Equipment and Method: Stylet Placement Confirmation: ETT inserted through vocal cords under direct vision,  positive ETCO2 and breath sounds checked- equal and bilateral Secured at: 23 cm Tube secured with: Tape Dental Injury: Teeth and Oropharynx as per pre-operative assessment

## 2018-12-12 NOTE — Procedures (Signed)
Extubation Procedure Note  Patient Details:   Name: Matthew Liu DOB: Feb 15, 1941 MRN: [id number]   Airway Documentation:    Vent end date: (not recorded) Vent end time: (not recorded)   Evaluation  O2 sats: stable throughout Complications: No apparent complications Patient did tolerate procedure well. Bilateral Breath Sounds: Clear, Diminished   Yes   Patient achieved NIF -25 and FVC 879ml. Patient had cuff leak. RT extubated patient to 4L [place]. Patient able to verbalize name and achieved IS 425ml. Patient's vitals are stable at this time.  Patsy Baltimore Season Astacio 12/12/2018, 11:11 PM

## 2018-12-12 NOTE — Progress Notes (Signed)
      [address]       [place] [address]             [phone]      S/p CABG x 5  Intubated, sedated BP 110/71   Pulse 79   Temp (!) 97 F (36.1 C)   Resp 12   Ht 5\' 9"  (1.753 m)   Wt 73.2 kg   SpO2 100%   BMI 23.83 kg/m  PA 18/9 CI= 1.9  Intake/Output Summary (Last 24 hours) at 12/12/2018 1802 Last data filed at 12/12/2018 1700 Gross per 24 hour  Intake 3604.24 ml  Output 4230 ml  Net -625.76 ml   Hct= 27 PLT 106K, PT= 18.5, PTT= 37  Moderate CT output- receiving FFP and platelets currently  Steven C. Roxan Hockey, MD [facility] [phone]

## 2018-12-12 NOTE — Progress Notes (Addendum)
Pt able to follow all commands, RAAS -1. CHG bathed pt. Family at bedside talking to pt.  RT called @2200  to begin rapid wean.  Arrived @2205 . Rapid wean began @2208 .

## 2018-12-12 NOTE — Anesthesia Procedure Notes (Signed)
Central Venous Catheter Insertion Performed by: Oleta Mouse, MD, anesthesiologist Start/End3/01/2019 8:01 AM, 12/12/2018 8:08 AM Patient location: Pre-op. Preanesthetic checklist: patient identified, IV checked, site marked, risks and benefits discussed, surgical consent, monitors and equipment checked, pre-op evaluation, timeout performed and anesthesia consent Position: supine Lidocaine 1% used for infiltration and patient sedated Hand hygiene performed  and maximum sterile barriers used  Catheter size: 8 Fr Total catheter length 16. Central line was placed.Double lumen Procedure performed using ultrasound guided technique. Ultrasound Notes:anatomy identified, needle tip was noted to be adjacent to the nerve/plexus identified, no ultrasound evidence of intravascular and/or intraneural injection and image(s) printed for medical record Attempts: 1 Following insertion, dressing applied, line sutured and Biopatch. Post procedure assessment: blood return through all ports, free fluid flow and no air  Patient tolerated the procedure well with no immediate complications.

## 2018-12-13 ENCOUNTER — Inpatient Hospital Stay (HOSPITAL_COMMUNITY): Payer: Medicare Other

## 2018-12-13 ENCOUNTER — Encounter (HOSPITAL_COMMUNITY): Payer: Self-pay | Admitting: Thoracic Surgery (Cardiothoracic Vascular Surgery)

## 2018-12-13 LAB — BASIC METABOLIC PANEL
Anion gap: 10 (ref 5–15)
Anion gap: 8 (ref 5–15)
BUN: 15 mg/dL (ref 8–23)
BUN: 19 mg/dL (ref 8–23)
CO2: 21 mmol/L — ABNORMAL LOW (ref 22–32)
CO2: 23 mmol/L (ref 22–32)
Calcium: 7.8 mg/dL — ABNORMAL LOW (ref 8.9–10.3)
Calcium: 7.9 mg/dL — ABNORMAL LOW (ref 8.9–10.3)
Chloride: 107 mmol/L (ref 98–111)
Chloride: 104 mmol/L (ref 98–111)
Creatinine, Ser: 0.93 mg/dL (ref 0.61–1.24)
Creatinine, Ser: 1.11 mg/dL (ref 0.61–1.24)
GFR calc Af Amer: >60 mL/min (ref >60)
GFR calc non Af Amer: >60 mL/min (ref >60)
GFR calc non Af Amer: >60 mL/min (ref >60)
Glucose, Bld: 173 mg/dL — ABNORMAL HIGH (ref 70–99)
Glucose, Bld: 163 mg/dL — ABNORMAL HIGH (ref 70–99)
POTASSIUM: 4.2 mmol/L (ref 3.5–5.1)
Potassium: 3.9 mmol/L (ref 3.5–5.1)
Sodium: 138 mmol/L (ref 135–145)
Sodium: 135 mmol/L (ref 135–145)

## 2018-12-13 LAB — PREPARE PLATELET PHERESIS
Unit division: 0
Unit division: 0

## 2018-12-13 LAB — PREPARE FRESH FROZEN PLASMA
Unit division: 0
Unit division: 0

## 2018-12-13 LAB — POCT I-STAT 7, (LYTES, BLD GAS, ICA,H+H)
Acid-base deficit: 3 mmol/L — ABNORMAL HIGH (ref 0.0–2.0)
Bicarbonate: 22.3 mmol/L (ref 20.0–28.0)
Calcium, Ion: 1.16 mmol/L (ref 1.15–1.40)
HCT: 18 % — ABNORMAL LOW (ref 39.0–52.0)
Hemoglobin: 6.1 g/dL — CL (ref 13.0–17.0)
O2 Saturation: 96 %
Patient temperature: 36.9
Potassium: 4.4 mmol/L (ref 3.5–5.1)
Sodium: 139 mmol/L (ref 135–145)
TCO2: 23 mmol/L (ref 22–32)
pCO2 arterial: 39.9 mmHg (ref 32.0–48.0)
pH, Arterial: 7.354 (ref 7.350–7.450)
pO2, Arterial: 87 mmHg (ref 83.0–108.0)

## 2018-12-13 LAB — BPAM PLATELET PHERESIS
BLOOD PRODUCT EXPIRATION DATE: 202003052359
Blood Product Expiration Date: 202003052359
ISSUE DATE / TIME: 202003041710
ISSUE DATE / TIME: 202003041756
Unit Type and Rh: 6200
Unit Type and Rh: 6200

## 2018-12-13 LAB — BPAM FFP
Blood Product Expiration Date: 202003052359
Blood Product Expiration Date: 202003052359
ISSUE DATE / TIME: 202003041941
ISSUE DATE / TIME: 202003042121
Unit Type and Rh: 7300
Unit Type and Rh: 7300

## 2018-12-13 LAB — PREPARE RBC (CROSSMATCH)

## 2018-12-13 LAB — CBC
HCT: 19.9 % — ABNORMAL LOW (ref 39.0–52.0)
HCT: 26.6 % — ABNORMAL LOW (ref 39.0–52.0)
Hemoglobin: 6.7 g/dL — CL (ref 13.0–17.0)
Hemoglobin: 9 g/dL — ABNORMAL LOW (ref 13.0–17.0)
MCH: 33.3 pg (ref 26.0–34.0)
MCH: 31.7 pg (ref 26.0–34.0)
MCHC: 33.7 g/dL (ref 30.0–36.0)
MCHC: 33.8 g/dL (ref 30.0–36.0)
MCV: 99 fL (ref 80.0–100.0)
MCV: 93.7 fL (ref 80.0–100.0)
Platelets: 133 10*3/uL — ABNORMAL LOW (ref 150–400)
Platelets: 129 10*3/uL — ABNORMAL LOW (ref 150–400)
RBC: 2.01 MIL/uL — AB (ref 4.22–5.81)
RBC: 2.84 MIL/uL — ABNORMAL LOW (ref 4.22–5.81)
RDW: 12.7 % (ref 11.5–15.5)
RDW: 17.9 % — ABNORMAL HIGH (ref 11.5–15.5)
WBC: 5.9 10*3/uL (ref 4.0–10.5)
WBC: 8.4 10*3/uL (ref 4.0–10.5)
nRBC: 0 % (ref 0.0–0.2)
nRBC: 0 % (ref 0.0–0.2)

## 2018-12-13 LAB — GLUCOSE, CAPILLARY
GLUCOSE-CAPILLARY: 163 mg/dL — AB (ref 70–99)
GLUCOSE-CAPILLARY: 133 mg/dL — AB (ref 70–99)
GLUCOSE-CAPILLARY: 126 mg/dL — AB (ref 70–99)
Glucose-Capillary: 107 mg/dL — ABNORMAL HIGH (ref 70–99)
Glucose-Capillary: 110 mg/dL — ABNORMAL HIGH (ref 70–99)
Glucose-Capillary: 139 mg/dL — ABNORMAL HIGH (ref 70–99)

## 2018-12-13 LAB — MAGNESIUM
Magnesium: 2.5 mg/dL — ABNORMAL HIGH (ref 1.7–2.4)
Magnesium: 2.5 mg/dL — ABNORMAL HIGH (ref 1.7–2.4)

## 2018-12-13 LAB — HEMOGLOBIN AND HEMATOCRIT, BLOOD
HCT: 20.7 % — ABNORMAL LOW (ref 39.0–52.0)
Hemoglobin: 7.1 g/dL — ABNORMAL LOW (ref 13.0–17.0)

## 2018-12-13 MED ORDER — SODIUM CHLORIDE 0.9% IV SOLUTION
Freq: Once | INTRAVENOUS | Status: AC
  Administered 2018-12-13: 06:00:00 via INTRAVENOUS

## 2018-12-13 MED ORDER — FUROSEMIDE 10 MG/ML IJ SOLN
20.0000 mg | Freq: Two times a day (BID) | INTRAMUSCULAR | Status: AC
  Administered 2018-12-13 – 2018-12-14 (×3): 20 mg via INTRAVENOUS
  Filled 2018-12-13 (×3): qty 2

## 2018-12-13 MED ORDER — ORAL CARE MOUTH RINSE
15.0000 mL | Freq: Two times a day (BID) | OROMUCOSAL | Status: DC
  Administered 2018-12-14 – 2018-12-15 (×2): 15 mL via OROMUCOSAL

## 2018-12-13 MED ORDER — ALPRAZOLAM 0.25 MG PO TABS
0.2500 mg | ORAL_TABLET | Freq: Two times a day (BID) | ORAL | Status: DC | PRN
  Administered 2018-12-14: 0.25 mg via ORAL
  Filled 2018-12-13: qty 1

## 2018-12-13 MED FILL — Potassium Chloride Inj 2 mEq/ML: INTRAVENOUS | Qty: 40 | Status: AC

## 2018-12-13 MED FILL — Dexmedetomidine HCl in NaCl 0.9% IV Soln 400 MCG/100ML: INTRAVENOUS | Qty: 100 | Status: AC

## 2018-12-13 MED FILL — Magnesium Sulfate Inj 50%: INTRAMUSCULAR | Qty: 10 | Status: AC

## 2018-12-13 MED FILL — Heparin Sodium (Porcine) Inj 1000 Unit/ML: INTRAMUSCULAR | Qty: 30 | Status: AC

## 2018-12-13 NOTE — Progress Notes (Addendum)
TCTS DAILY ICU PROGRESS NOTE                   [address]            [place] [address]          [phone]   1 Day Post-Op Procedure(s) (LRB): CORONARY ARTERY BYPASS GRAFTING (CABG) (N/A) TRANSESOPHAGEAL ECHOCARDIOGRAM (TEE) (N/A)  Total Length of Stay:  LOS: 2 days   Subjective: Feels okay this morning. A little confused.   Objective: Vital signs in last 24 hours: Temp:  [95.9 F (35.5 C)-98.4 F (36.9 C)] 98.1 F (36.7 C) (03/05 0700) Pulse Rate:  [61-81] 76 (03/05 0700) Cardiac Rhythm: Normal sinus rhythm (03/05 0400) Resp:  [12-25] 23 (03/05 0700) BP: (76-124)/(44-78) 108/51 (03/05 0700) SpO2:  [93 %-100 %] 98 % (03/05 0700) Arterial Line BP: (100-130)/(40-65) 128/45 (03/05 0700) FiO2 (%):  [40 %-50 %] 40 % (03/04 2230) Weight:  [78.5 kg] 78.5 kg (03/05 0500)  Filed Weights   12/11/18 0707 12/12/18 0539 12/13/18 0500  Weight: 72.6 kg 73.2 kg 78.5 kg    Weight change: 5.897 kg   Hemodynamic parameters for last 24 hours: PAP: (16-27)/(2-12) 24/8 CO:  [2.8 L/min-5 L/min] 5 L/min CI:  [1.5 L/min/m2-2.7 L/min/m2] 2.7 L/min/m2  Intake/Output from previous day: 03/04 0701 - 03/05 0700 In: 6310.1 [I.V.:3889.9; Blood:1070.6; IV Piggyback:1349.6] Out: 4325 [Urine:2555; Blood:700; Chest Tube:1070]  Intake/Output this shift: No intake/output data recorded.  Current Meds: Scheduled Meds: . acetaminophen  1,000 mg Oral Q6H  . atorvastatin  40 mg Oral QPM  . bisacodyl  10 mg Oral Daily   Or  . bisacodyl  10 mg Rectal Daily  . Chlorhexidine Gluconate Cloth  6 each Topical Daily  . docusate sodium  200 mg Oral Daily  . insulin aspart  0-24 Units Subcutaneous Q4H  . metoprolol tartrate  12.5 mg Oral BID  . [START ON 12/14/2018] pantoprazole  40 mg Oral Daily  . sodium chloride flush  10-40 mL Intracatheter Q12H  . sodium chloride flush  3 mL Intravenous Q12H   Continuous Infusions: . sodium chloride    . cefUROXime (ZINACEF)  IV Stopped  (12/13/18 2130)  . lactated ringers    . lactated ringers 20 mL/hr at 12/13/18 0700  . phenylephrine (NEO-SYNEPHRINE) Adult infusion 10 mcg/min (12/13/18 0700)   PRN Meds:.metoprolol tartrate, morphine injection, ondansetron (ZOFRAN) IV, oxyCODONE, sodium chloride flush, traMADol  General appearance: alert, cooperative and no distress Heart: regular rate and rhythm, S1, S2 normal, no murmur, click, rub or gallop Lungs: clear to auscultation bilaterally and diminished in the lower lobes Abdomen: soft, non-tender; bowel sounds normal; no masses,  no organomegaly Extremities: lower right leg edema (vein harvest leg) Wound: clean and dry dressed with a sterile dressing  Lab Results: CBC: Recent Labs    12/12/18 1955  12/13/18 0013 12/13/18 0357  WBC 6.6  --   --  5.9  HGB 8.0*   < > 6.1* 6.7*  HCT 24.0*   < > 18.0* 19.9*  PLT 167  --   --  133*   < > = values in this interval not displayed.   BMET:  Recent Labs    12/12/18 1955  12/12/18 2307 12/13/18 0013 12/13/18 0357  NA 138   < > 139 139 138  K 4.4   < > 4.6 4.4 4.2  CL 110  --   --   --  107  CO2 21*  --   --   --  21*  GLUCOSE 138*  --  168*  --  173*  BUN 13  --   --   --  15  CREATININE 0.84  --   --   --  0.93  CALCIUM 7.9*  --   --   --  7.8*   < > = values in this interval not displayed.    CMET: Lab Results  Component Value Date   WBC 5.9 12/13/2018   HGB 6.7 (LL) 12/13/2018   HCT 19.9 (L) 12/13/2018   PLT 133 (L) 12/13/2018   GLUCOSE 173 (H) 12/13/2018   CHOL 108 03/20/2017   TRIG 87 03/20/2017   HDL 45 03/20/2017   LDLCALC 46 03/20/2017   ALT 17 12/11/2018   AST 21 12/11/2018   NA 138 12/13/2018   K 4.2 12/13/2018   CL 107 12/13/2018   CREATININE 0.93 12/13/2018   BUN 15 12/13/2018   CO2 21 (L) 12/13/2018   TSH 0.573 07/14/2013   INR 1.6 (H) 12/12/2018   HGBA1C 5.7 (H) 12/11/2018      PT/INR:  Recent Labs    12/12/18 1432  LABPROT 18.5*  INR 1.6*   Radiology: Dg Chest Port 1  View  Result Date: 12/12/2018 CLINICAL DATA:  Atelectasis. EXAM: PORTABLE CHEST 1 VIEW COMPARISON:  12/11/2018 radiographs.  CT 01/08/2018. FINDINGS: 1416 hours. Two views obtained. Interval median sternotomy and CABG. Tip of the endotracheal tube is in the mid trachea. Enteric tube projects below the diaphragm. There is a right IJ Swan-Ganz catheter with its tip in the proximal right pulmonary artery. Left chest tube and mediastinal drains are in place. There is a small left pleural effusion and mild atelectasis at both lung bases. No edema or pneumothorax identified. IMPRESSION: Postop CABG without demonstrated complication. Mild bibasilar atelectasis and small left pleural effusion. Electronically Signed   By: Richardean Sale M.D.   On: 12/12/2018 14:58     Assessment/Plan: S/P Procedure(s) (LRB): CORONARY ARTERY BYPASS GRAFTING (CABG) (N/A) TRANSESOPHAGEAL ECHOCARDIOGRAM (TEE) (N/A)  1. CV-NSR in the 70s, BP well controlled. Remains on Neo.  2. Pulm-Tolerating 4L [place] with good oxygen saturation. Continue to use incentive spirometry. Low lung volumes on CXR this morning.  3. Renal-creatinine 0.93, electrolytes okay. Expected acute fluid overload. Weight is up 5kg. Will start a diuretic regimen when appropriate. 4. H and H - 6.7/19.9-it appears he already got 2 units of platelets and a unit of FFP. One unit of pRBC ordered.  5. Endo-blood glucose well controlled. Last CBGs 141, 129, 110  Plan: Continue post-op antibiotics. Weaning Neo as tolerated. Advance diet as tolerated. See progression orders. OOB to chair. Continue post-op antibiotics.    Elgie Collard 12/13/2018 7:34 AM   I have seen and examined the patient and agree with the assessment and plan as outlined.  Looks very good POD1.  Will transfuse PRBC's for expected post op acute blood loss anemia w/ Hgb < 7  Rexene Alberts, MD 12/13/2018 10:20 AM

## 2018-12-13 NOTE — Progress Notes (Signed)
Patient ID: Matthew Liu, male   DOB: [birth year], 78 y.o.   MRN: [id number] TCTS Evening Rounds:  Hemodynamically stable in sinus rhythm.  sats 98%  Urine output ok. Lasix ordered.  Chest tube output low.  Transfused this am for low Hgb and improved to 9.0.  BMET    Component Value Date/Time   NA 135 12/13/2018 1630   NA 139 12/10/2018 1100   K 3.9 12/13/2018 1630   CL 104 12/13/2018 1630   CO2 23 12/13/2018 1630   GLUCOSE 163 (H) 12/13/2018 1630   BUN 19 12/13/2018 1630   BUN 16 12/10/2018 1100   CREATININE 1.11 12/13/2018 1630   CALCIUM 7.9 (L) 12/13/2018 1630   GFRNONAA >60 12/13/2018 1630   GFRAA >60 12/13/2018 1630   CBC    Component Value Date/Time   WBC 8.4 12/13/2018 1630   RBC 2.84 (L) 12/13/2018 1630   HGB 9.0 (L) 12/13/2018 1630   HGB 12.8 (L) 12/10/2018 1100   HCT 26.6 (L) 12/13/2018 1630   HCT 37.0 (L) 12/10/2018 1100   PLT 129 (L) 12/13/2018 1630   PLT 183 12/10/2018 1100   MCV 93.7 12/13/2018 1630   MCV 97 12/10/2018 1100   MCH 31.7 12/13/2018 1630   MCHC 33.8 12/13/2018 1630   RDW 17.9 (H) 12/13/2018 1630   RDW 13.0 12/10/2018 1100   LYMPHSABS 1.1 01/09/2018 1243   MONOABS 0.2 01/09/2018 1243   EOSABS 0.1 01/09/2018 1243   BASOSABS 0.0 01/09/2018 1243

## 2018-12-13 NOTE — Progress Notes (Signed)
CRITICAL VALUE ALERT  Critical Value:  Hgb 6.7  Date & Time Notified:  Today, now  Provider Notified: -MD not notified due to orders stating to maintain Hgb >6.0 and only call if drops below 6.0 -Pt has had stable CT output, roughly 95mL/hr over the past 4hrs -Pink tinged urine resolved at approximately 0030 -No other bleeding noted  -VSS  Orders Received/Actions taken: n/a

## 2018-12-14 ENCOUNTER — Inpatient Hospital Stay (HOSPITAL_COMMUNITY): Payer: Medicare Other

## 2018-12-14 ENCOUNTER — Ambulatory Visit: Payer: Medicare Other | Admitting: Interventional Cardiology

## 2018-12-14 DIAGNOSIS — Z951 Presence of aortocoronary bypass graft: Secondary | ICD-10-CM

## 2018-12-14 LAB — CBC
HCT: 26 % — ABNORMAL LOW (ref 39.0–52.0)
HEMOGLOBIN: 8.4 g/dL — AB (ref 13.0–17.0)
MCH: 30.4 pg (ref 26.0–34.0)
MCHC: 32.3 g/dL (ref 30.0–36.0)
MCV: 94.2 fL (ref 80.0–100.0)
Platelets: 116 10*3/uL — ABNORMAL LOW (ref 150–400)
RBC: 2.76 MIL/uL — AB (ref 4.22–5.81)
RDW: 18 % — ABNORMAL HIGH (ref 11.5–15.5)
WBC: 8.5 10*3/uL (ref 4.0–10.5)
nRBC: 0 % (ref 0.0–0.2)

## 2018-12-14 LAB — BASIC METABOLIC PANEL
Anion gap: 5 (ref 5–15)
BUN: 23 mg/dL (ref 8–23)
CO2: 24 mmol/L (ref 22–32)
Calcium: 7.9 mg/dL — ABNORMAL LOW (ref 8.9–10.3)
Chloride: 105 mmol/L (ref 98–111)
Creatinine, Ser: 1.07 mg/dL (ref 0.61–1.24)
GFR calc Af Amer: >60 mL/min (ref >60)
GFR calc non Af Amer: >60 mL/min (ref >60)
Glucose, Bld: 134 mg/dL — ABNORMAL HIGH (ref 70–99)
Potassium: 4 mmol/L (ref 3.5–5.1)
Sodium: 134 mmol/L — ABNORMAL LOW (ref 135–145)

## 2018-12-14 LAB — GLUCOSE, CAPILLARY
Glucose-Capillary: 115 mg/dL — ABNORMAL HIGH (ref 70–99)
Glucose-Capillary: 120 mg/dL — ABNORMAL HIGH (ref 70–99)

## 2018-12-14 MED ORDER — SODIUM CHLORIDE 0.9 % IV SOLN: 250.0000 mL | INTRAVENOUS | Status: DC | PRN

## 2018-12-14 MED ORDER — CLOPIDOGREL BISULFATE 75 MG PO TABS
75.0000 mg | ORAL_TABLET | Freq: Every day | ORAL | Status: DC
  Administered 2018-12-14 – 2018-12-16 (×3): 75 mg via ORAL
  Filled 2018-12-14 (×3): qty 1

## 2018-12-14 MED ORDER — SODIUM CHLORIDE 0.9% FLUSH: 3.0000 mL | INTRAVENOUS | Status: DC | PRN

## 2018-12-14 MED ORDER — METOPROLOL TARTRATE 12.5 MG HALF TABLET: 12.5000 mg | ORAL_TABLET | Freq: Two times a day (BID) | ORAL | Status: DC

## 2018-12-14 MED ORDER — ALPRAZOLAM 0.25 MG PO TABS
0.2500 mg | ORAL_TABLET | Freq: Every evening | ORAL | Status: DC | PRN
  Administered 2018-12-15: 0.25 mg via ORAL
  Filled 2018-12-14: qty 1

## 2018-12-14 MED ORDER — CLOPIDOGREL BISULFATE 75 MG PO TABS: 75.0000 mg | ORAL_TABLET | Freq: Every day | ORAL | Status: DC

## 2018-12-14 MED ORDER — SODIUM CHLORIDE 0.9% FLUSH
3.0000 mL | Freq: Two times a day (BID) | INTRAVENOUS | Status: DC
  Administered 2018-12-14 – 2018-12-16 (×6): 3 mL via INTRAVENOUS

## 2018-12-14 MED ORDER — MOVING RIGHT ALONG BOOK
Freq: Once | Status: AC
  Administered 2018-12-14: 1

## 2018-12-14 MED FILL — Sodium Bicarbonate IV Soln 8.4%: INTRAVENOUS | Qty: 50 | Status: AC

## 2018-12-14 MED FILL — Heparin Sodium (Porcine) Inj 1000 Unit/ML: INTRAMUSCULAR | Qty: 10 | Status: AC

## 2018-12-14 MED FILL — Lidocaine HCl(Cardiac) IV PF Soln Pref Syr 100 MG/5ML (2%): INTRAVENOUS | Qty: 5 | Status: AC

## 2018-12-14 MED FILL — Mannitol IV Soln 20%: INTRAVENOUS | Qty: 500 | Status: AC

## 2018-12-14 MED FILL — Sodium Chloride IV Soln 0.9%: INTRAVENOUS | Qty: 2000 | Status: AC

## 2018-12-14 MED FILL — Electrolyte-R (PH 7.4) Solution: INTRAVENOUS | Qty: 4000 | Status: AC

## 2018-12-14 NOTE — Progress Notes (Signed)
12/14/2018 6:11 PM Received pt to room 4E-05 from [facility].  Pt is A&O, no C/O voiced.  Tele monitor applied and CCMD notified.  CHG bath given.  Oriented to room, call light and bed.  Call bell in reach, family at bedside. [place]

## 2018-12-14 NOTE — Discharge Summary (Signed)
[address]       [place] [address]             [phone]      Physician Discharge Summary  Patient ID: KENETH BORG MRN: [id number] DOB/AGE: [birth year] 78 y.o.  Admit date: 12/11/2018 Discharge date: 12/17/2018  Admission Diagnoses:  Patient Active Problem List   Diagnosis Date Noted  . Coronary artery disease involving native coronary artery of native heart with unstable angina pectoris ([place]) 12/11/2018  . Unstable angina ([place])   . Lumbar compression fracture ([place]) 01/09/2018  . 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 Acute ischemic stroke ([place]) 09/15/2015  . Ankle fracture, left   . OSA (obstructive sleep apnea)   . Erectile dysfunction 12/01/2014  . Aspirin allergy 07/16/2013  . Dyslipidemia 07/16/2013  . 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 HTN (hypertension) 07/13/2013  . GERD (gastroesophageal reflux disease) 07/13/2013  . Barrett's esophagus 07/13/2013    Discharge Diagnoses:  Principal Problem:   S/P CABG x 5 Active Problems:   Old MI (myocardial infarction)   HTN (hypertension)   GERD (gastroesophageal reflux disease)   Unstable angina (HCC)   Coronary artery disease involving native coronary artery of native heart with unstable angina pectoris [facility])   Discharged Condition: good  HPI:   The patient is a 78 year old male with a history of coronary artery disease dating back to 2014.  At that time he had an acute inferior myocardial infarction and underwent stenting of the right coronary artery in the ostial as well as mid vessel regions.  He was also found at that time to have complex circumflex disease that was not felt to demonstrate ischemia by scintography in 2015.  He has other significant risk factors including hyperlipidemia, hypertension and previous CVA.  Recently he has developed increasing angina symptoms.  He notes the symptoms while walking up inclines at fairly short distances which will lead  to chest tightness.  There also symptoms associated with emotional upset.  The symptoms do improve with rest.  This is prompted cardiac catheterization which was done on today's date and he is found to have a very multivessel coronary artery disease (see report below) and we are asked to see the patient for consideration of coronary artery surgical revascularization.  The patient has been on Plavix and is noted to be allergic to aspirin.  The P2 Y 12 assay will be done to help establish timing of surgery if he is felt to be a candidate to proceed.   Hospital Course:   Mr. Meroney is a 78 year old male patient who underwent coronary bypass grafting x5 with Dr. Roxy Manns on 12/12/2018.  He tolerated the procedure well and was transferred to the surgical ICU.  He was extubated in a timely manner.  Postop day 1 he remained in normal sinus rhythm.  He did require some Neo-Synephrine for blood pressure support.  He was tolerating 4 L nasal cannula.  He did have some expected postoperative acute blood loss anemia and we transfused 1 unit of packed red blood cells.  The patient's hemoglobin responded appropriately.  He did have some periods of confusion but overall was oriented to person and place.  Postop day 2 we were able to remove his chest tubes.  His Swan-Ganz catheter and arterial line have been removed.  We discontinued his Foley catheter.  We initiated a diuretic regimen for fluid overload.  He was stable to be  transferred to 4E. for continued care. He continued to progress. He was weaned off oxygen and was walking around the unit with limited assistance. Today, he is tolerating room air, ambulating in the halls, his incisions are healing well, and he is ready for discharge home.    Consults: None  Significant Diagnostic Studies:    Thrombotic/bulky calcified distal left main greater than 90%.  99% ostial circumflex.  Mid to distal circumflex 95%.  Ostial LAD 99%.  Mid LAD beyond the first large diagonal  90%.  First diagonal ostium to proximal 50 to 60%  RCA is heavily calcified from just inside the ostium to the mid vessel with generalized 25% in-stent restenosis.  Ostium contains 50 to 60% narrowing.  No distal disease is noted in the RCA.  Normal systolic function.  LVEDP 16 mmHg.  RECOMMENDATIONS:   The patient has been chronically on Plavix because he is allergic to aspirin.  He took Plavix this morning.  Plavix will be discontinued and a P2 Y 12 assay will be done.  Hospitalized and start IV heparin and nitroglycerin as the patient is having multiple episodes of angina daily with minimal activity and an occasional episode at rest.  Currently pain-free.  Continue high intensity statin therapy.  TCTS consultation for surgery when appropriate based upon clinical judgment and the patient's status.  Treatments:   CARDIOTHORACIC SURGERY OPERATIVE NOTE  Date of Procedure:    12/12/2018  Preoperative Diagnosis:        Severe 3-vessel Coronary Artery Disease  Unstable Angina Pectoris  Postoperative Diagnosis:    Same  Procedure:        Coronary Artery Bypass Grafting x 5              Left Internal Mammary Artery to Distal Left Anterior Descending Coronary Artery             Saphenous Vein Graft to Distal Right Coronary Artery             Saphenous Vein Graft to Second Obtuse Marginal Branch of Left Circumflex Coronary Artery             Sequential Saphenous Vein Graft to Third Obtuse Marginal Branch of Left Circumflex Coronary Artery             Sapheonous Vein Graft to Diagonal Branch Coronary Artery             Endoscopic Vein Harvest from Right Thigh and Lower Leg  Surgeon:        Valentina Gu. Roxy Manns, MD  Assistant:       Nicholes Rough, PA-C  Anesthesia:    Adele Barthel, MD  Operative Findings: ? Normal left ventricular systolic function ? Good quality left internal mammary artery conduit ? Fair quality saphenous vein conduit ? Fair quality target vessels for  grafting     Discharge Exam: Blood pressure 125/62, pulse 96, temperature 99.4 F (37.4 C), temperature source Oral, resp. rate 20, height 5\' 9"  (1.753 m), weight 75.8 kg, SpO2 92 %.    General appearance: alert, cooperative and no distress Heart: regular rate and rhythm, S1, S2 normal, no murmur, click, rub or gallop Lungs: clear to auscultation bilaterally Abdomen: soft, non-tender; bowel sounds normal; no masses,  no organomegaly Extremities: extremities normal, atraumatic, no cyanosis or edema Wound: clean and dry   Disposition: Discharge disposition: 01-Home or Self Care        Allergies as of 12/17/2018      Reactions   Aspirin  Hives, Shortness Of Breath   "Shortness of Breath" can't breathe   Quinine Derivatives Hives   "looks like he has the measles"      Medication List    STOP taking these medications   isosorbide mononitrate 30 MG 24 hr tablet Commonly known as:  IMDUR   nitroGLYCERIN 0.4 MG SL tablet Commonly known as:  Nitrostat   traMADol 50 MG tablet Commonly known as:  ULTRAM     TAKE these medications   acetaminophen 500 MG tablet Commonly known as:  TYLENOL Take 1 tablet (500 mg total) by mouth every 6 (six) hours as needed for mild pain or headache.   ALPRAZolam 0.25 MG tablet Commonly known as:  XANAX Take 0.25 mg by mouth 2 (two) times daily as needed for anxiety.   atorvastatin 80 MG tablet Commonly known as:  LIPITOR Take 1 tablet (80 mg total) by mouth every evening. What changed:  how much to take   clopidogrel 75 MG tablet Commonly known as:  PLAVIX TAKE 1 TABLET BY MOUTH EVERY DAY   hydrochlorothiazide 12.5 MG capsule Commonly known as:  MICROZIDE TAKE 1 CAPSULE BY MOUTH EVERY DAY What changed:  how much to take   lisinopril 20 MG tablet Commonly known as:  PRINIVIL,ZESTRIL Take 1 tablet (20 mg total) by mouth at bedtime. What changed:    medication strength  how much to take   metoprolol tartrate 25 MG  tablet Commonly known as:  LOPRESSOR Take 1 tablet (25 mg total) by mouth 2 (two) times daily. What changed:  how much to take   oxyCODONE 5 MG immediate release tablet Commonly known as:  Oxy IR/ROXICODONE Take 1 tablet (5 mg total) by mouth every 6 (six) hours as needed for severe pain.   pantoprazole 40 MG tablet Commonly known as:  PROTONIX Take 1 tablet (40 mg total) by mouth daily. What changed:    medication strength  how much to take   SENIOR MULTIVITAMIN PLUS PO Take 1 tablet by mouth every morning.   Vitamin D 50 MCG (2000 UT) tablet Take 2,000 Units by mouth daily.      Follow-up Information    Nicoletta Dress, MD. Call in 1 day(s).   Specialty:  Internal Medicine Contact information: [place] [address] [phone]        Belva Crome, MD. Call in 1 day(s).   Specialty:  Cardiology Contact information: [address] [place] [address] [phone]        [facility] Follow up.   Specialty:  Cardiothoracic Surgery Why:  Your routine follow-up appointment is on 01/14/2019 at 1pm. Please arrive at 12:30pm for a chest xray located at [place] which is on the first floor of our building.  Contact information: [place], [place]       Burtis Junes, NP Follow up.   Specialties:  Nurse Practitioner, Interventional Cardiology, Cardiology, Radiology Why:  Truitt Merle, NP 3/25 @11  am ([facility])  Contact information: [place]. [address] [place] [address] [phone]          The patient has been discharged on:   1.Beta Blocker:  Yes [  yes ]                              No   [   ]  If No, reason:  2.Ace Inhibitor/ARB: Yes [  yes ]                                     No  [    ]                                     If No,  reason:   3.Statin:   Yes [ yes  ]                  No  [   ]                  If No, reason:  4.Ecasa:  Yes  [   ]                  No   [  no ]                  If No, reason: Plavix, ASA allergy   Signed: Elgie Collard 12/17/2018, 7:41 AM

## 2018-12-14 NOTE — Discharge Instructions (Signed)

## 2018-12-14 NOTE — Progress Notes (Addendum)
TCTS DAILY ICU PROGRESS NOTE                   [address]            [place] [address]          [phone]   2 Days Post-Op Procedure(s) (LRB): CORONARY ARTERY BYPASS GRAFTING (CABG) (N/A) TRANSESOPHAGEAL ECHOCARDIOGRAM (TEE) (N/A)  Total Length of Stay:  LOS: 3 days   Subjective: Feels good this morning. Still feels "fuzzy" at times with his mentation, but oriented to person and place.   Objective: Vital signs in last 24 hours: Temp:  [97.3 F (36.3 C)-98.3 F (36.8 C)] 97.3 F (36.3 C) (03/06 0300) Pulse Rate:  [68-89] 68 (03/06 0600) Cardiac Rhythm: Normal sinus rhythm (03/06 0400) Resp:  [11-26] 18 (03/06 0600) BP: (91-133)/(49-94) 115/61 (03/06 0600) SpO2:  [91 %-100 %] 98 % (03/06 0600) Arterial Line BP: (116-136)/(44-55) 136/49 (03/05 1215) Weight:  [79.2 kg] 79.2 kg (03/06 0500)  Filed Weights   12/12/18 0539 12/13/18 0500 12/14/18 0500  Weight: 73.2 kg 78.5 kg 79.2 kg    Weight change: 0.771 kg   Hemodynamic parameters for last 24 hours: PAP: (21-29)/(9-15) 25/11 CO:  [4.5 L/min-4.9 L/min] 4.5 L/min CI:  [2.4 L/min/m2-2.6 L/min/m2] 2.4 L/min/m2  Intake/Output from previous day: 03/05 0701 - 03/06 0700 In: 1315.8 [P.O.:120; I.V.:465.8; Blood:630; IV Piggyback:100] Out: 0109 [Urine:815; Chest Tube:420]  Intake/Output this shift: No intake/output data recorded.  Current Meds: Scheduled Meds: . acetaminophen  1,000 mg Oral Q6H  . atorvastatin  40 mg Oral QPM  . bisacodyl  10 mg Oral Daily   Or  . bisacodyl  10 mg Rectal Daily  . Chlorhexidine Gluconate Cloth  6 each Topical Daily  . docusate sodium  200 mg Oral Daily  . furosemide  20 mg Intravenous BID  . insulin aspart  0-24 Units Subcutaneous Q4H  . mouth rinse  15 mL Mouth Rinse BID  . metoprolol tartrate  12.5 mg Oral BID  . pantoprazole  40 mg Oral Daily  . sodium chloride flush  10-40 mL Intracatheter Q12H  . sodium chloride flush  3 mL Intravenous Q12H   Continuous  Infusions: . sodium chloride    . lactated ringers    . lactated ringers 20 mL/hr at 12/14/18 0600  . phenylephrine (NEO-SYNEPHRINE) Adult infusion Stopped (12/13/18 0859)   PRN Meds:.ALPRAZolam, metoprolol tartrate, morphine injection, ondansetron (ZOFRAN) IV, oxyCODONE, sodium chloride flush, traMADol  General appearance: alert, cooperative and no distress Heart: regular rate and rhythm, S1, S2 normal, no murmur, click, rub or gallop Lungs: clear to auscultation bilaterally Abdomen: soft, non-tender; bowel sounds normal; no masses,  no organomegaly Extremities: + edema bilateral lower extremities Wound: clean and dry dressed with sterile dressing  Lab Results: CBC: Recent Labs    12/13/18 1630 12/14/18 0348  WBC 8.4 8.5  HGB 9.0* 8.4*  HCT 26.6* 26.0*  PLT 129* 116*   BMET:  Recent Labs    12/13/18 1630 12/14/18 0348  NA 135 134*  K 3.9 4.0  CL 104 105  CO2 23 24  GLUCOSE 163* 134*  BUN 19 23  CREATININE 1.11 1.07  CALCIUM 7.9* 7.9*    CMET: Lab Results  Component Value Date   WBC 8.5 12/14/2018   HGB 8.4 (L) 12/14/2018   HCT 26.0 (L) 12/14/2018   PLT 116 (L) 12/14/2018   GLUCOSE 134 (H) 12/14/2018   CHOL 108 03/20/2017   TRIG 87 03/20/2017  HDL 45 03/20/2017   LDLCALC 46 03/20/2017   ALT 17 12/11/2018   AST 21 12/11/2018   NA 134 (L) 12/14/2018   K 4.0 12/14/2018   CL 105 12/14/2018   CREATININE 1.07 12/14/2018   BUN 23 12/14/2018   CO2 24 12/14/2018   TSH 0.573 07/14/2013   INR 1.6 (H) 12/12/2018   HGBA1C 5.7 (H) 12/11/2018      PT/INR:  Recent Labs    12/12/18 1432  LABPROT 18.5*  INR 1.6*   Radiology: No results found.   Assessment/Plan: S/P Procedure(s) (LRB): CORONARY ARTERY BYPASS GRAFTING (CABG) (N/A) TRANSESOPHAGEAL ECHOCARDIOGRAM (TEE) (N/A)   1. CV-NSR in the 70s-back-up pacer rate set to 68, BP well controlled. Off Neo.  2. Pulm-Tolerating 2L [place] with good oxygen saturation. Continue to use incentive spirometry. CXR  stable this morning. One chest tube remaining.  3. Renal-creatinine 1.07, electrolytes okay. Expected acute fluid overload. Weight is up 6kg. On Lasix 20mg  IV BID.  4. H and H - 8.4/26.0-s/p pRBC.  5. Endo-blood glucose well controlled. Last CBGs [phone]. A1C id 5.7. Will discontinue blood glucose checks.  Plan: Only about 170cc/12 hours out of pleural chest tube. Can likely remove this morning. BP improved with 1 unit pRBC given yesterday. Continue diuretics for fluid overload. Encouraged ambulation in the halls today.     Elgie Collard 12/14/2018 7:31 AM    I have seen and examined the patient and agree with the assessment and plan as outlined.  Doing well POD2.  D/C tubes.  Mobilize.  Diuresis. Transfer 4E  Rexene Alberts, MD 12/14/2018 8:07 AM

## 2018-12-15 ENCOUNTER — Inpatient Hospital Stay (HOSPITAL_COMMUNITY): Payer: Medicare Other

## 2018-12-15 LAB — CBC
HCT: 26.1 % — ABNORMAL LOW (ref 39.0–52.0)
Hemoglobin: 8.6 g/dL — ABNORMAL LOW (ref 13.0–17.0)
MCH: 30.8 pg (ref 26.0–34.0)
MCHC: 33 g/dL (ref 30.0–36.0)
MCV: 93.5 fL (ref 80.0–100.0)
Platelets: 115 10*3/uL — ABNORMAL LOW (ref 150–400)
RBC: 2.79 MIL/uL — ABNORMAL LOW (ref 4.22–5.81)
RDW: 17 % — ABNORMAL HIGH (ref 11.5–15.5)
WBC: 7.5 10*3/uL (ref 4.0–10.5)
nRBC: 0.3 % — ABNORMAL HIGH (ref 0.0–0.2)

## 2018-12-15 LAB — BPAM RBC
Blood Product Expiration Date: 202003262359
Blood Product Expiration Date: 202003262359
Blood Product Expiration Date: 202003312359
Blood Product Expiration Date: 202003312359
ISSUE DATE / TIME: 202003050616
ISSUE DATE / TIME: 202003050853
Unit Type and Rh: 5100
Unit Type and Rh: 5100
Unit Type and Rh: 5100
Unit Type and Rh: 5100

## 2018-12-15 LAB — TYPE AND SCREEN
ABO/RH(D): O POS
Antibody Screen: NEGATIVE
Unit division: 0
Unit division: 0
Unit division: 0
Unit division: 0

## 2018-12-15 LAB — BASIC METABOLIC PANEL
Anion gap: 8 (ref 5–15)
BUN: 26 mg/dL — ABNORMAL HIGH (ref 8–23)
CO2: 25 mmol/L (ref 22–32)
Calcium: 7.9 mg/dL — ABNORMAL LOW (ref 8.9–10.3)
Chloride: 99 mmol/L (ref 98–111)
Creatinine, Ser: 0.97 mg/dL (ref 0.61–1.24)
GFR calc Af Amer: >60 mL/min (ref >60)
GFR calc non Af Amer: >60 mL/min (ref >60)
Glucose, Bld: 123 mg/dL — ABNORMAL HIGH (ref 70–99)
Potassium: 3.7 mmol/L (ref 3.5–5.1)
Sodium: 132 mmol/L — ABNORMAL LOW (ref 135–145)

## 2018-12-15 MED ORDER — FE FUMARATE-B12-VIT C-FA-IFC PO CAPS
1.0000 | ORAL_CAPSULE | Freq: Three times a day (TID) | ORAL | Status: DC
  Administered 2018-12-15 – 2018-12-17 (×6): 1 via ORAL
  Filled 2018-12-15 (×5): qty 1

## 2018-12-15 MED ORDER — POTASSIUM CHLORIDE CRYS ER 20 MEQ PO TBCR
20.0000 meq | EXTENDED_RELEASE_TABLET | Freq: Two times a day (BID) | ORAL | Status: DC
  Administered 2018-12-16 – 2018-12-17 (×3): 20 meq via ORAL
  Filled 2018-12-15 (×3): qty 1

## 2018-12-15 MED ORDER — FUROSEMIDE 40 MG PO TABS
40.0000 mg | ORAL_TABLET | Freq: Two times a day (BID) | ORAL | Status: AC
  Administered 2018-12-15 – 2018-12-16 (×3): 40 mg via ORAL
  Filled 2018-12-15 (×3): qty 1

## 2018-12-15 MED ORDER — ATORVASTATIN CALCIUM 80 MG PO TABS
80.0000 mg | ORAL_TABLET | Freq: Every evening | ORAL | Status: DC
  Administered 2018-12-15: 80 mg via ORAL
  Filled 2018-12-15: qty 1

## 2018-12-15 MED ORDER — METOPROLOL TARTRATE 25 MG PO TABS
25.0000 mg | ORAL_TABLET | Freq: Two times a day (BID) | ORAL | Status: DC
  Administered 2018-12-15 – 2018-12-17 (×4): 25 mg via ORAL
  Filled 2018-12-15 (×4): qty 1

## 2018-12-15 NOTE — Progress Notes (Addendum)
      [address]       [place] [address]             [phone]     CARDIOTHORACIC SURGERY PROGRESS NOTE  3 Days Post-Op  S/P Procedure(s) (LRB): CORONARY ARTERY BYPASS GRAFTING (CABG) (N/A) TRANSESOPHAGEAL ECHOCARDIOGRAM (TEE) (N/A)  Subjective: Looks good.  Minimal pain.  Denies SOB.  Slept well.  Appetite slow but improving  Objective: Vital signs in last 24 hours: Temp:  [97.8 F (36.6 C)-98.7 F (37.1 C)] 97.8 F (36.6 C) (03/07 0437) Pulse Rate:  [62-89] 89 (03/07 0945) Cardiac Rhythm: Normal sinus rhythm (03/07 0800) Resp:  [15-22] 18 (03/06 2034) BP: (109-146)/(58-78) 112/78 (03/07 0945) SpO2:  [88 %-96 %] 92 % (03/07 0400) Weight:  [77.8 kg] 77.8 kg (03/07 0531)  Physical Exam:  Rhythm:   sinus  Breath sounds: clear  Heart sounds:  RRR  Incisions:  Dressings dry, intact  Abdomen:  Soft, non-distended, non-tender  Extremities:  Warm, well-perfused    Intake/Output from previous day: 03/06 0701 - 03/07 0700 In: 774.8 [P.O.:720; I.V.:54.8] Out: 930 [Urine:900; Chest Tube:30] Intake/Output this shift: Total I/O In: 3 [I.V.:3] Out: -   Lab Results: Recent Labs    12/14/18 0348 12/15/18 0348  WBC 8.5 7.5  HGB 8.4* 8.6*  HCT 26.0* 26.1*  PLT 116* 115*   BMET:  Recent Labs    12/14/18 0348 12/15/18 0348  NA 134* 132*  K 4.0 3.7  CL 105 99  CO2 24 25  GLUCOSE 134* 123*  BUN 23 26*  CREATININE 1.07 0.97  CALCIUM 7.9* 7.9*    CBG (last 3)  Recent Labs    12/13/18 2351 12/14/18 0404 12/14/18 0742  GLUCAP 107* 115* 120*   PT/INR:   Recent Labs    12/12/18 1432  LABPROT 18.5*  INR 1.6*    CXR:  CHEST - 2 VIEW  COMPARISON:  12/14/2018  FINDINGS: Cardiac shadow is enlarged but stable. Postsurgical changes are again seen. Right jugular sheath has been removed in the interval. Mild bibasilar atelectasis is seen. Small effusions are noted bilaterally. No significant vascular congestion is  noted.  IMPRESSION: Stable bibasilar atelectasis and small effusions. Previously seen central venous congestion has resolved   Electronically Signed   By: Inez Catalina M.D.   On: 12/15/2018 08:08  Assessment/Plan: S/P Procedure(s) (LRB): CORONARY ARTERY BYPASS GRAFTING (CABG) (N/A) TRANSESOPHAGEAL ECHOCARDIOGRAM (TEE) (N/A)  Doing well POD3 Maintaining NSR w/ stable BP Breathing comfortably w/ O2 sats 92-100% on room air, CXR looks good Expected post op acute blood loss anemia, stable Expected post op atelectasis, mild Expected post op volume excess, weight down but still reportedly 4 kg > preop, UOP adequate   Mobilize  Diuresis  Increase metoprolol 25 bid  Continue Plavix - no ASA due to allergy  Lipitor 80  Continue to hold ACE-I unless BP trends up  Anticipate possible d/c home 2-3 days  Matthew Alberts, MD 12/15/2018 11:09 AM

## 2018-12-15 NOTE — Progress Notes (Signed)
CARDIAC REHAB PHASE I   PRE:  Rate/Rhythm: 73 SR    BP: sitting 122/76    SaO2: 95 RA  MODE:  Ambulation: 900 ft   POST:  Rate/Rhythm: 101 ST    BP: sitting 129/82     SaO2: 92 RA  Pt feeling anxious this am. To BR then ambulated hall. Had short burst of fast rate in BR but none while walking. Wanted to increased distance, steady with RW. He has RW at home. To recliner, encouraged 1-2 more walks today. Had him practice IS, 1000 mL.  [phone]   [place], ACSM 12/15/2018 11:35 AM

## 2018-12-15 NOTE — Progress Notes (Signed)
Patient ambulated about 550 ft in hallway with RN,room air using front wheel walker tolerated very well.

## 2018-12-16 MED ORDER — CLOPIDOGREL BISULFATE 75 MG PO TABS
75.0000 mg | ORAL_TABLET | Freq: Every day | ORAL | Status: DC
  Administered 2018-12-17: 75 mg via ORAL
  Filled 2018-12-16: qty 1

## 2018-12-16 MED ORDER — LISINOPRIL 10 MG PO TABS
20.0000 mg | ORAL_TABLET | Freq: Every day | ORAL | Status: DC
  Administered 2018-12-16: 20 mg via ORAL
  Filled 2018-12-16: qty 2

## 2018-12-16 MED ORDER — HYDROCHLOROTHIAZIDE 12.5 MG PO CAPS
12.5000 mg | ORAL_CAPSULE | Freq: Every day | ORAL | Status: DC
  Administered 2018-12-17: 12.5 mg via ORAL
  Filled 2018-12-16: qty 1

## 2018-12-16 MED ORDER — PANTOPRAZOLE SODIUM 40 MG PO TBEC: 40.0000 mg | DELAYED_RELEASE_TABLET | Freq: Every day | ORAL | Status: DC

## 2018-12-16 MED ORDER — PANTOPRAZOLE SODIUM 40 MG PO TBEC
40.0000 mg | DELAYED_RELEASE_TABLET | Freq: Every day | ORAL | Status: DC
  Administered 2018-12-17: 40 mg via ORAL
  Filled 2018-12-16: qty 1

## 2018-12-16 MED ORDER — CLOPIDOGREL BISULFATE 75 MG PO TABS: 75.0000 mg | ORAL_TABLET | Freq: Every day | ORAL | Status: DC

## 2018-12-16 MED ORDER — ATORVASTATIN CALCIUM 80 MG PO TABS
80.0000 mg | ORAL_TABLET | Freq: Every evening | ORAL | Status: DC
  Administered 2018-12-16: 80 mg via ORAL
  Filled 2018-12-16: qty 1

## 2018-12-16 NOTE — Progress Notes (Signed)
Ambulated about 600 ft,room air,using cane with RN tolerated well.

## 2018-12-16 NOTE — Progress Notes (Addendum)
4 Days Post-Op Procedure(s) (LRB): CORONARY ARTERY BYPASS GRAFTING (CABG) (N/A) TRANSESOPHAGEAL ECHOCARDIOGRAM (TEE) (N/A) Subjective: Awake and alert, sitting up on side of bed.  He walked in the hall earlier this morning.  He says his appetite is poor but otherwise has no new issues to report.  Objective: Vital signs in last 24 hours: Temp:  [97.2 F (36.2 C)-99.1 F (37.3 C)] 98.1 F (36.7 C) (03/08 0918) Pulse Rate:  [53-91] 82 (03/08 1123) Cardiac Rhythm: Normal sinus rhythm (03/08 0700) Resp:  [16-19] 17 (03/08 0918) BP: (121-138)/(63-83) 138/79 (03/08 0918) SpO2:  [94 %-98 %] 98 % (03/08 0918) Weight:  [75.8 kg] 75.8 kg (03/08 0531)     Intake/Output from previous day: 03/07 0701 - 03/08 0700 In: 363 [P.O.:360; I.V.:3] Out: -  Intake/Output this shift: No intake/output data recorded.  General appearance: alert, cooperative and no distress Neurologic: intact Heart: regular rate and rhythm Lungs: Breath sounds are clear, diminished. Wound: The sternotomy incision and right lower extremity incision are well approximated and dry.  Both are covered with Dermabond Extremities: Mild lower extremity edema  Lab Results: Recent Labs    12/14/18 0348 12/15/18 0348  WBC 8.5 7.5  HGB 8.4* 8.6*  HCT 26.0* 26.1*  PLT 116* 115*   BMET:  Recent Labs    12/14/18 0348 12/15/18 0348  NA 134* 132*  K 4.0 3.7  CL 105 99  CO2 24 25  GLUCOSE 134* 123*  BUN 23 26*  CREATININE 1.07 0.97  CALCIUM 7.9* 7.9*    PT/INR: No results for input(s): LABPROT, INR in the last 72 hours. ABG    Component Value Date/Time   PHART 7.354 12/13/2018 0013   HCO3 22.3 12/13/2018 0013   TCO2 23 12/13/2018 0013   ACIDBASEDEF 3.0 (H) 12/13/2018 0013   O2SAT 96.0 12/13/2018 0013   CBG (last 3)  Recent Labs    12/13/18 2351 12/14/18 0404 12/14/18 0742  GLUCAP 107* 115* 120*    Assessment/Plan: S/P Procedure(s) (LRB): CORONARY ARTERY BYPASS GRAFTING (CABG) (N/A) TRANSESOPHAGEAL  ECHOCARDIOGRAM (TEE) (N/A)  -Postop day 4 coronary bypass grafting.  He is making reasonable progress with mobility.  He is maintaining adequate oxygen saturation on room air.  Continue Plavix (aspirin allergy), atorvastatin, metoprolol. D/C pacer wires. -Hypertension-systolic blood pressure has trended up slightly to mid 130s.  Will tolerate low-dose lisinopril if this persists. -Expected acute blood loss anemia.  Hematocrit has been trending up.  No indication for transfusion.  Continue iron supplement repeat labs in a.m. -Postoperative volume excess, continue oral Lasix.  Check serum potassium in a.m. -DVT prophylaxis-SCD's when not ambulating   LOS: 5 days    Antony Odea, PA-C [phone] 12/16/2018  I have seen and examined the patient and agree with the assessment and plan as outlined.  Anticipate likely d/c home tomorrow.  Will restart lisinopril at 1/2 home dose.  Rexene Alberts, MD 12/16/2018 2:01 PM

## 2018-12-16 NOTE — Progress Notes (Signed)
Removed epicardial wires per order. 3 intact.  Pt tolerated procedure well.  Pt instructed to remain on bedrest for one hour.  Frequent vitals will be taken and documented. Pt resting with call bell within reach. Jashun Puertas [place], RN   

## 2018-12-17 ENCOUNTER — Telehealth: Payer: Self-pay

## 2018-12-17 LAB — BASIC METABOLIC PANEL
Anion gap: 9 (ref 5–15)
BUN: 22 mg/dL (ref 8–23)
CO2: 26 mmol/L (ref 22–32)
Calcium: 7.8 mg/dL — ABNORMAL LOW (ref 8.9–10.3)
Chloride: 103 mmol/L (ref 98–111)
Creatinine, Ser: 1.05 mg/dL (ref 0.61–1.24)
GFR calc Af Amer: >60 mL/min (ref >60)
GFR calc non Af Amer: >60 mL/min (ref >60)
Glucose, Bld: 122 mg/dL — ABNORMAL HIGH (ref 70–99)
Potassium: 3.5 mmol/L (ref 3.5–5.1)
Sodium: 138 mmol/L (ref 135–145)

## 2018-12-17 LAB — CBC
HEMATOCRIT: 24.9 % — AB (ref 39.0–52.0)
Hemoglobin: 8.4 g/dL — ABNORMAL LOW (ref 13.0–17.0)
MCH: 31.5 pg (ref 26.0–34.0)
MCHC: 33.7 g/dL (ref 30.0–36.0)
MCV: 93.3 fL (ref 80.0–100.0)
Platelets: 145 10*3/uL — ABNORMAL LOW (ref 150–400)
RBC: 2.67 MIL/uL — ABNORMAL LOW (ref 4.22–5.81)
RDW: 16.9 % — ABNORMAL HIGH (ref 11.5–15.5)
WBC: 5.5 10*3/uL (ref 4.0–10.5)
nRBC: 0 % (ref 0.0–0.2)

## 2018-12-17 MED ORDER — PANTOPRAZOLE SODIUM 40 MG PO TBEC: 40.0000 mg | DELAYED_RELEASE_TABLET | Freq: Every day | ORAL | 1 refills | Status: DC

## 2018-12-17 MED ORDER — LISINOPRIL 20 MG PO TABS: 20.0000 mg | ORAL_TABLET | Freq: Every day | ORAL | 1 refills | Status: DC

## 2018-12-17 MED ORDER — POTASSIUM CHLORIDE CRYS ER 20 MEQ PO TBCR
40.0000 meq | EXTENDED_RELEASE_TABLET | Freq: Once | ORAL | Status: AC
  Administered 2018-12-17: 40 meq via ORAL
  Filled 2018-12-17: qty 2

## 2018-12-17 MED ORDER — OXYCODONE HCL 5 MG PO TABS: 5.0000 mg | ORAL_TABLET | Freq: Four times a day (QID) | ORAL | 0 refills | Status: DC | PRN

## 2018-12-17 MED ORDER — ATORVASTATIN CALCIUM 80 MG PO TABS: 80.0000 mg | ORAL_TABLET | Freq: Every evening | ORAL | 2 refills | Status: DC

## 2018-12-17 MED ORDER — METOPROLOL TARTRATE 25 MG PO TABS: 25.0000 mg | ORAL_TABLET | Freq: Two times a day (BID) | ORAL | 1 refills | Status: DC

## 2018-12-17 NOTE — Progress Notes (Signed)
      [address]       [place] [address]             [phone]    5 Days Post-Op Procedure(s) (LRB): CORONARY ARTERY BYPASS GRAFTING (CABG) (N/A) TRANSESOPHAGEAL ECHOCARDIOGRAM (TEE) (N/A) Subjective: Feels good this morning. He is sleepy-just woke up.   Objective: Vital signs in last 24 hours: Temp:  [98.1 F (36.7 C)-99.4 F (37.4 C)] 99.4 F (37.4 C) (03/09 0455) Pulse Rate:  [61-96] 96 (03/08 2030) Cardiac Rhythm: Normal sinus rhythm (03/08 1900) Resp:  [17-29] 20 (03/09 0455) BP: (111-138)/(57-79) 125/62 (03/09 0455) SpO2:  [92 %-98 %] 92 % (03/09 0455)     Intake/Output from previous day: 03/08 0701 - 03/09 0700 In: 240 [P.O.:240] Out: -  Intake/Output this shift: No intake/output data recorded.  General appearance: alert, cooperative and no distress Heart: regular rate and rhythm, S1, S2 normal, no murmur, click, rub or gallop Lungs: clear to auscultation bilaterally Abdomen: soft, non-tender; bowel sounds normal; no masses,  no organomegaly Extremities: extremities normal, atraumatic, no cyanosis or edema Wound: clean and dry  Lab Results: Recent Labs    12/15/18 0348 12/17/18 0331  WBC 7.5 5.5  HGB 8.6* 8.4*  HCT 26.1* 24.9*  PLT 115* 145*   BMET:  Recent Labs    12/15/18 0348 12/17/18 0331  NA 132* 138  K 3.7 3.5  CL 99 103  CO2 25 26  GLUCOSE 123* 122*  BUN 26* 22  CREATININE 0.97 1.05  CALCIUM 7.9* 7.8*    PT/INR: No results for input(s): LABPROT, INR in the last 72 hours. ABG    Component Value Date/Time   PHART 7.354 12/13/2018 0013   HCO3 22.3 12/13/2018 0013   TCO2 23 12/13/2018 0013   ACIDBASEDEF 3.0 (H) 12/13/2018 0013   O2SAT 96.0 12/13/2018 0013   CBG (last 3)  Recent Labs    12/14/18 0742  GLUCAP 120*    Assessment/Plan: S/P Procedure(s) (LRB): CORONARY ARTERY BYPASS GRAFTING (CABG) (N/A) TRANSESOPHAGEAL ECHOCARDIOGRAM (TEE) (N/A)  1. CV-BP well controlled. On Plavix (ASA allergy).  Continue Lipitor. Will start half dose of lisinopril. NSR in the 90s.  2. Pulm-tolerating room air with excellent saturation. Continue to encourage incentive spirometer.  3. Renal-creatinine 1.05, hypokalemia-replacing. Continue HCTZ 4. H and H- 8.4/24.9, expected acute blood loss anemia. Platelets 145k 5. Endo-blood glucose level 107, 115, 120  Plan: Wires are removed. He feels good this morning and is ready for discharge.    LOS: 6 days    Matthew Liu 12/17/2018

## 2018-12-17 NOTE — Telephone Encounter (Signed)
-----   Message from Frederik Schmidt, RN sent at 12/17/2018  9:08 AM EDT ----- Regarding: FW: TOC call  ----- Message ----- From: Candis Schatz Sent: 12/14/2018   2:20 PM EDT To: [facility] Triage Subject: TOC call                                       Pt is gonna be dc'd in the next few days, has an appt. Will need TOC call.  Thanks [employer]

## 2018-12-17 NOTE — Telephone Encounter (Signed)
**Note De-Identified  Obfuscation** The pt is currently in the hospital. We will call once discharged.

## 2018-12-17 NOTE — Progress Notes (Signed)
Ed completed with pt and wife. Good reception. He walked long distances with RW over the weekend. Will refer to [place] CES, ACSM 8:37 AM 12/17/2018

## 2018-12-17 NOTE — Progress Notes (Signed)
Discussed with the patient and all questioned fully answered. He will call me if any problems arise.  IV removed. Telemetry removed, CCMD notified. Chest tube sutures removed per order and dressed with benzoin and steri strips.   Confirmed all medications were sent to the correct pharmacy.   Fritz Pickerel, RN

## 2018-12-18 NOTE — Telephone Encounter (Signed)
Attempted to contact patient but there was no answer and no VM picked up.

## 2018-12-18 NOTE — Telephone Encounter (Signed)
Left message for patient to call back  

## 2018-12-20 NOTE — Telephone Encounter (Signed)
Left message on patient's cell phone for him to please call the office regarding his recent discharge from the hospital When home number is dialed, it goes to busy signal

## 2019-01-01 ENCOUNTER — Telehealth: Payer: Self-pay | Admitting: Nurse Practitioner

## 2019-01-01 NOTE — Telephone Encounter (Signed)
Primary Cardiologist: Sinclair Grooms, MD   Pt contacted. History and symptoms reviewed - recent MI with CABG - patient requesting to keep visit - has no computer at home. Screening for COVID 19 negative. Patient will f/u with [facility] provider as scheduled. Pt advised that we are restricting visitors at this time and request that only patients present for check in prior to their appointment. All other visitors should remain in their car. If necessary, only one visitor may come with the patient, into the building. For everyone's safety, all patients and visitors entering our practice area should expect to be screened again prior to entering our waiting area.   Burtis Junes, RN, [place] [address] [place], [place]  [id number] [phone]

## 2019-01-02 ENCOUNTER — Ambulatory Visit: Payer: Medicare Other | Admitting: Nurse Practitioner

## 2019-01-07 ENCOUNTER — Telehealth: Payer: Self-pay | Admitting: Thoracic Surgery (Cardiothoracic Vascular Surgery)

## 2019-01-07 ENCOUNTER — Encounter: Payer: Self-pay | Admitting: Thoracic Surgery (Cardiothoracic Vascular Surgery)

## 2019-01-07 NOTE — Telephone Encounter (Signed)
[address]       [place] [address]             [phone]     CARDIOTHORACIC SURGERY TELEPHONE VIRTUAL OFFICE NOTE  Primary Cardiologist is Sinclair Grooms, MD PCP is Nicoletta Dress, MD   HPI:  I spoke with Matthew Liu via telephone on 01/07/2019 at 3:46 PM and verified that I was speaking with the correct person.  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  The patient's early postoperative recovery in the hospital was uneventful and he was discharged home on fifth postoperative day.  He was scheduled for routine follow-up appointment in our office this coming week.  Because of the ongoing COVID-19 pandemic I contacted patient directly via telephone.  Patient reports he is doing quite well.  He no longer has any significant soreness in his chest.  He is walking every day.  He denies any exertional chest pain or shortness of breath.  His only complaint is that he is getting tired of being cooped up at home and he wants to be doing more of his routine activities.  He has not had recent fevers, sore throat, or productive cough.  He has not traveled at all and to his knowledge she has not been exposed to any persons with known or suspected COVID-19 infection.  Overall he is pleased with his progress.   Current Outpatient Medications  Medication Sig Dispense Refill  . acetaminophen (TYLENOL) 500 MG tablet Take 1 tablet (500 mg total) by mouth every 6 (six) hours as needed for mild pain or headache. 30 tablet 0  . ALPRAZolam (XANAX) 0.25 MG tablet Take 0.25 mg by mouth  2 (two) times daily as needed for anxiety.     [name] atorvastatin (LIPITOR) 80 MG tablet Take 1 tablet (80 mg total) by mouth every evening. 30 tablet 2  . Cholecalciferol (VITAMIN D) 50 MCG (2000 UT) tablet Take 2,000 Units by mouth daily.    . clopidogrel (PLAVIX) 75 MG tablet TAKE 1 TABLET BY MOUTH EVERY DAY (Patient taking differently: Take 75 mg by mouth daily. ) 30 tablet 11  . hydrochlorothiazide (MICROZIDE) 12.5 MG capsule TAKE 1 CAPSULE BY MOUTH EVERY DAY (Patient taking differently: Take 12.5 mg by mouth daily. ) 30 capsule 11  . lisinopril (PRINIVIL,ZESTRIL) 20 MG tablet Take 1 tablet (20 mg total) by mouth at bedtime. 30 tablet 1  . metoprolol tartrate (LOPRESSOR) 25 MG tablet Take 1 tablet (25 mg total) by mouth 2 (two) times daily. 60 tablet 1  . Multiple Vitamins-Minerals (SENIOR MULTIVITAMIN PLUS PO) Take 1 tablet by mouth every morning.     [name] oxyCODONE (OXY IR/ROXICODONE) 5 MG immediate release tablet Take 1 tablet (5 mg total) by mouth every 6 (six) hours as needed for severe pain. 30 tablet 0  . pantoprazole (PROTONIX) 40 MG tablet Take 1 tablet (40 mg total) by mouth daily. 30 tablet 1   No current facility-administered medications for this visit.      Diagnostic Tests:  n/a   Impression:  Patient seems to be doing quite well approximately 4-week status post  coronary artery bypass grafting   Plan:  I have encouraged the patient to continue to gradually increase his physical activity and to walk as much as possible.  We have not recommended any changes to his current medications.  I have cautioned him to be careful about social distancing and avoiding contact with anyone who may have been exposed to other persons with COVID-19 infection.  We will plan to postpone the patient's routine follow-up appointment in our office for at least 4 weeks.  The patient will call should any problems or difficulties arise during the interim.  All questions answered.    I discussed  limitations of evaluation and management via telephone.  The patient was advised to call back or seek an in-person evaluation if the patient's clinical condition changes in any significant manner.     Valentina Gu. Roxy Manns, MD 01/07/2019 3:46 PM

## 2019-01-14 ENCOUNTER — Ambulatory Visit: Payer: Medicare Other | Admitting: Thoracic Surgery (Cardiothoracic Vascular Surgery)

## 2019-01-16 ENCOUNTER — Other Ambulatory Visit: Payer: Self-pay

## 2019-01-16 DIAGNOSIS — G8918 Other acute postprocedural pain: Secondary | ICD-10-CM

## 2019-01-16 MED ORDER — TRAMADOL HCL 50 MG PO TABS: 50.0000 mg | ORAL_TABLET | Freq: Four times a day (QID) | ORAL | 0 refills | Status: DC | PRN

## 2019-01-16 NOTE — Telephone Encounter (Signed)
RX for Tramadol 50 mg 1 po every 6 hours prn/ #28 faxed to [facility].

## 2019-02-06 ENCOUNTER — Telehealth: Payer: Self-pay | Admitting: *Deleted

## 2019-02-06 NOTE — Telephone Encounter (Signed)
Called both lines lvm on cell phone with all instructions about pts' VT visit on Friday May 1 with Truitt Merle, NP. Pt was also given office number if pt needs to R/S. Will get consent the day of VT.

## 2019-02-07 NOTE — Progress Notes (Signed)
Telehealth Visit     Virtual Visit via Telephone Note   This visit type was conducted due to national recommendations for restrictions regarding the COVID-19 Pandemic (e.g. social distancing) in an effort to limit this patient's exposure and mitigate transmission in our community.  Due to his co-morbid illnesses, this patient is at least at moderate risk for complications without adequate follow up.  This format is felt to be most appropriate for this patient at this time.  The patient did not have access to video technology/had technical difficulties with video requiring transitioning to audio format only (telephone).  All issues noted in this document were discussed and addressed.  No physical exam could be performed with this format.  Please refer to the patient's chart for his  consent to telehealth for [facility].   Evaluation Performed:  Follow-up visit  This visit type was conducted due to national recommendations for restrictions regarding the COVID-19 Pandemic (e.g. social distancing).  This format is felt to be most appropriate for this patient at this time.  All issues noted in this document were discussed and addressed.  No physical exam was performed (except for noted visual exam findings with Video Visits).  Please refer to the patient's chart (MyChart message for video visits and phone note for telephone visits) for the patient's consent to telehealth for [facility].  Date:  02/08/2019   ID:  Matthew Liu, DOB [birth year], MRN [id number]  Patient Location:  Home  Provider location:   Home  PCP:  Nicoletta Dress, MD  Cardiologist:  Servando Snare & Sinclair Grooms, MD  Electrophysiologist:  None   Chief Complaint:  Post CABG  History of Present Illness:    Matthew Liu is a 78 y.o. male who presents via audio/video conferencing for a telehealth visit today.  Seen for Dr. Tamala Julian.   He has a history of known CAD dating back to 2014. At that time he had an acute  inferior myocardial infarction and underwent stenting of the right coronary artery in the ostial as well as mid vessel regions. He was also found at that time to have complex circumflex disease that was not felt to demonstrate ischemia by scintographyin 2015.   Other issues include HLD, HTN and prior CVA.   Last seen by Dr. Tamala Julian in February 2020 - was endorsing exertional chest pain that improved with rest. He was referred for cardiac cath showing multivessel coronary artery disease (see report below) and TCTS was consulted. He is allergic to aspirin and is on Plavix.   He underwent CABG x 5 with Dr. Roxy Manns on 12/12/2018.  Post op course was unremarkable. He maintained NSR.  He did have some expected postoperative acute blood loss anemia and we transfused 1 unit of packed red blood cells.  He did have some periods of confusion noted.  He was diuresed for volume overload.    The patient does not have symptoms concerning for COVID-19 infection (fever, chills, cough, or new shortness of breath).   Seen today via telephone conversation. He has consented for this visit. He does not have My Chart. He does not have a smart phone or reliable Internet. He is doing ok. Staying home and not going out. He is not having chest pain. He notes his energy level has improved since the surgery. Not dizzy or lightheaded. He is not short of breath. No swelling. He walked 40 minutes yesterday and did well. BP is good. Weight is down. Uses Ultram rarely -  but this causes constipation. He says his incisions look good and have healed up. He has no real concerns. He is aspirin allergic and has been on chronic Plavix.   Past Medical History:  Diagnosis Date  . Acute ischemic stroke ([place]) 09/15/2015  . Ankle fracture, left 2009   rod placed  . Anxiety   . Aspirin allergy    a. Anaphylactic rxn.  . Barrett's esophagus   . CAD (coronary artery disease)    a. Inf STEMI 07/2013: s/p DES x 2 from ostial to [facility] 07/13/13.  Residual Cx disease for med rx (PCI if recurrent pain).  . Dyslipidemia   . ED (erectile dysfunction)   . GERD (gastroesophageal reflux disease)   . History of nuclear stress test    Nuclear stress test 5/19: EF 53, no ischemia, no infarction; Low Risk  . Hydrocele of testis   . Hyperglycemia    a. A1C 5.8 in 07/2013.  [name] Hypertension   . Myocardial infarction ([place])   . 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 cataract surgery      bilateral   . CORONARY ANGIOPLASTY     stents   . CORONARY ARTERY BYPASS GRAFT N/A 12/12/2018   Procedure: CORONARY ARTERY BYPASS GRAFTING (CABG);  Surgeon: Rexene Alberts, MD;  Location: [place];  Service: Open Heart Surgery;  Laterality: N/A;  . HYDROCELE EXCISION Left 07/17/2014   Procedure: LEFT HYDROCELECTOMY;  Surgeon: Malka So, MD;  Location: [facility];  Service: Urology;  Laterality: Left;  . left ankle surgery  1999  . LEFT HEART CATH AND CORONARY ANGIOGRAPHY N/A 12/11/2018   Procedure: LEFT HEART CATH AND CORONARY ANGIOGRAPHY;  Surgeon: Belva Crome, MD;  Location: [place] CV LAB;  Service: Cardiovascular;  Laterality: N/A;  . LEFT HEART CATHETERIZATION WITH CORONARY ANGIOGRAM N/A 07/13/2013   Procedure: LEFT HEART CATHETERIZATION WITH CORONARY ANGIOGRAM;  Surgeon: Sinclair Grooms, MD;  Location: [facility] CATH LAB;  Service: Cardiovascular;  Laterality: N/A;  . PERCUTANEOUS CORONARY STENT INTERVENTION (PCI-S)  07/13/2013   Procedure: PERCUTANEOUS CORONARY STENT INTERVENTION (PCI-S);  Surgeon: Sinclair Grooms, MD;  Location: [facility] CATH LAB;  Service: Cardiovascular;;  . TEE WITHOUT CARDIOVERSION N/A 12/12/2018   Procedure: TRANSESOPHAGEAL ECHOCARDIOGRAM (TEE);  Surgeon: Rexene Alberts, MD;  Location: [place];  Service: Open Heart Surgery;  Laterality: N/A;     Current Meds  Medication Sig  . acetaminophen (TYLENOL) 500 MG  tablet Take 1 tablet (500 mg total) by mouth every 6 (six) hours as needed for mild pain or headache.  . ALPRAZolam (XANAX) 0.25 MG tablet Take 0.25 mg by mouth 2 (two) times daily as needed for anxiety.   [name] atorvastatin (LIPITOR) 80 MG tablet Take 1 tablet (80 mg total) by mouth every evening.  . Cholecalciferol (VITAMIN D) 50 MCG (2000 UT) tablet Take 2,000 Units by mouth daily.  . clopidogrel (PLAVIX) 75 MG tablet Take 75 mg by mouth daily.  . hydrochlorothiazide (MICROZIDE) 12.5 MG capsule Take 12.5 mg by mouth daily.  [name] lisinopril (PRINIVIL,ZESTRIL) 20 MG tablet Take 1 tablet (20 mg total) by mouth at bedtime.  . metoprolol tartrate (LOPRESSOR) 25 MG tablet Take 1 tablet (25 mg total) by mouth 2 (two) times daily.  . Multiple Vitamins-Minerals (SENIOR MULTIVITAMIN PLUS PO) Take 1 tablet by  mouth every morning.   . pantoprazole (PROTONIX) 40 MG tablet Take 1 tablet (40 mg total) by mouth daily.  . traMADol (ULTRAM) 50 MG tablet Take 1 tablet (50 mg total) by mouth every 6 (six) hours as needed for severe pain.     Allergies:   Aspirin and Quinine derivatives   Social History   Tobacco Use  . Smoking status: Never Smoker  . Smokeless tobacco: Never Used  Substance Use Topics  . Alcohol use: Yes    Comment: occasional beer   . Drug use: No     Family Hx: The patient's family history includes Hypertension in his mother. There is no history of Stroke.  ROS:   Please see the history of present illness.   All other systems reviewed are negative.    Objective:    Vital Signs:  BP 121/77   Pulse 65   Ht 5\' 9"  (1.753 m)   Wt 155 lb (70.3 kg)   BMI 22.89 kg/m    Wt Readings from Last 3 Encounters:  02/08/19 155 lb (70.3 kg)  12/16/18 167 lb 1.7 oz (75.8 kg)  11/16/18 165 lb 3.2 oz (74.9 kg)    Alert male in no acute distress. He is not short of breath with conversation. His weight is down.    Labs/Other Tests and Data Reviewed:    Lab Results  Component Value Date    WBC 5.5 12/17/2018   HGB 8.4 (L) 12/17/2018   HCT 24.9 (L) 12/17/2018   PLT 145 (L) 12/17/2018   GLUCOSE 122 (H) 12/17/2018   CHOL 108 03/20/2017   TRIG 87 03/20/2017   HDL 45 03/20/2017   LDLCALC 46 03/20/2017   ALT 17 12/11/2018   AST 21 12/11/2018   NA 138 12/17/2018   K 3.5 12/17/2018   CL 103 12/17/2018   CREATININE 1.05 12/17/2018   BUN 22 12/17/2018   CO2 26 12/17/2018   TSH 0.573 07/14/2013   INR 1.6 (H) 12/12/2018   HGBA1C 5.7 (H) 12/11/2018       BNP (last 3 results) No results for input(s): BNP in the last 8760 hours.  ProBNP (last 3 results) No results for input(s): PROBNP in the last 8760 hours.    Prior CV studies:    The following studies were reviewed today:   Coronary Artery Bypass Grafting x5 March 2020 Left Internal Mammary Artery to Distal Left Anterior Descending Coronary Artery Saphenous Vein Graft to Distal RightCoronary Artery Saphenous Vein Graft to SecondObtuse Marginal Branch of Left Circumflex Coronary Artery Sequential Saphenous Vein Graft toThirdObtuse Marginal Branch of Left Circumflex Coronary Artery Sapheonous Vein Graft to Diagonal Branch Coronary Artery Endoscopic Vein Harvest from RightThigh and Lower Leg   Cardiac Cath 12/2018:    Thrombotic/bulky calcified distal left main greater than 90%.  99% ostial circumflex. Mid to distal circumflex 95%.  Ostial LAD 99%. Mid LAD beyond the first large diagonal 90%. First diagonal ostium to proximal 50 to 60%  RCA is heavily calcified from just inside the ostium to the mid vessel with generalized 25% in-stent restenosis. Ostium contains 50 to 60% narrowing. No distal disease is noted in the RCA.  Normal systolic function. LVEDP 16 mmHg.  RECOMMENDATIONS:   The patient has been chronically on Plavix because he is allergic to aspirin. He took Plavix this morning. Plavix will be discontinued and  a P2 Y 12 assay will be done.  Hospitalized and start IV heparin and nitroglycerin as the patient is having multiple episodes  of angina daily with minimal activity and an occasional episode at rest. Currently pain-free.  Continue high intensity statin therapy.  TCTS consultation for surgery when appropriate based upon clinical judgment and the patient's status.    ASSESSMENT & PLAN:    1.  CAD with prior inferior MI with prior PCI - now s/p CABG x 5 after catheterization for exertional chest pain revealed multi vessel CAD - he is doing well and making good progress. Energy level has improved. CV risk factor modification encouraged. Needs labs on return visit with EKG.   2. Post op anemia - no symptoms suggestive that this has not improved. Will need lab on return visit.   3. HTN - BP is great on current regimen. No changes made today - medicines are refilled today.   4. HLD - on statin - check lab on return.   5. COVID-19 Education: The signs and symptoms of COVID-19 were discussed with the patient and how to seek care for testing (follow up with PCP or arrange E-visit).  The importance of social distancing, staying at home, hand hygiene and wearing a mask when out in public were discussed today.  Patient Risk:   After full review of this patient's clinical status, I feel that they are at least moderate risk at this time.  Time:   Today, I have spent 10 minutes with the patient with telehealth technology discussing the above issues.     Medication Adjustments/Labs and Tests Ordered: Current medicines are reviewed at length with the patient today.  Concerns regarding medicines are outlined above.   Tests Ordered: No orders of the defined types were placed in this encounter.   Medication Changes: No orders of the defined types were placed in this encounter.   Disposition:  FU with Dr.Smith in August with lab and EKG.   Patient is agreeable to this plan and will call if any  problems develop in the interim.   Amie Critchley, NP  02/08/2019 3:04 PM    [name]

## 2019-02-08 ENCOUNTER — Telehealth (INDEPENDENT_AMBULATORY_CARE_PROVIDER_SITE_OTHER): Payer: Medicare Other | Admitting: Nurse Practitioner

## 2019-02-08 ENCOUNTER — Encounter: Payer: Self-pay | Admitting: Nurse Practitioner

## 2019-02-08 ENCOUNTER — Other Ambulatory Visit: Payer: Self-pay

## 2019-02-08 VITALS — BP 121/77 | HR 65 | Ht 69.0 in | Wt 155.0 lb

## 2019-02-08 DIAGNOSIS — Z951 Presence of aortocoronary bypass graft: Secondary | ICD-10-CM | POA: Diagnosis not present

## 2019-02-08 DIAGNOSIS — E785 Hyperlipidemia, unspecified: Secondary | ICD-10-CM

## 2019-02-08 DIAGNOSIS — I25118 Atherosclerotic heart disease of native coronary artery with other forms of angina pectoris: Secondary | ICD-10-CM

## 2019-02-08 DIAGNOSIS — Z7189 Other specified counseling: Secondary | ICD-10-CM

## 2019-02-08 MED ORDER — ATORVASTATIN CALCIUM 80 MG PO TABS: 80.0000 mg | ORAL_TABLET | Freq: Every evening | ORAL | 3 refills | Status: DC

## 2019-02-08 MED ORDER — HYDROCHLOROTHIAZIDE 12.5 MG PO CAPS: 12.5000 mg | ORAL_CAPSULE | Freq: Every day | ORAL | 3 refills | Status: DC

## 2019-02-08 MED ORDER — LISINOPRIL 20 MG PO TABS: 20.0000 mg | ORAL_TABLET | Freq: Every day | ORAL | 3 refills | Status: DC

## 2019-02-08 MED ORDER — PANTOPRAZOLE SODIUM 40 MG PO TBEC: 40.0000 mg | DELAYED_RELEASE_TABLET | Freq: Every day | ORAL | 3 refills | Status: DC

## 2019-02-08 MED ORDER — METOPROLOL TARTRATE 25 MG PO TABS: 25.0000 mg | ORAL_TABLET | Freq: Two times a day (BID) | ORAL | 3 refills | Status: DC

## 2019-02-08 NOTE — Telephone Encounter (Signed)
Virtual Visit Pre-Appointment Phone Call  "(Name), I am calling you today to discuss your upcoming appointment. We are currently trying to limit exposure to the virus that causes COVID-19 by seeing patients at home rather than in the office."  1. "What is the BEST phone number to call the day of the visit?" - include this in appointment notes  2. "Do you have or have access to (through a family member/friend) a smartphone with video capability that we can use for your visit?" a. If yes - list this number in appt notes as "cell" (if different from BEST phone #) and list the appointment type as a VIDEO visit in appointment notes b. If no - list the appointment type as a PHONE visit in appointment notes  3. Confirm consent - "In the setting of the current Covid19 crisis, you are scheduled for a (phone or video) visit with your provider on (Friday, May 1) at (2:45 pm).  Just as we do with many in-office visits, in order for you to participate in this visit, we must obtain consent.  If you'd like, I can send this to your mychart (if signed up) or email for you to review.  Otherwise, I can obtain your verbal consent now.  All virtual visits are billed to your insurance company just like a normal visit would be.  By agreeing to a virtual visit, we'd like you to understand that the technology does not allow for your provider to perform an examination, and thus may limit your provider's ability to fully assess your condition. If your provider identifies any concerns that need to be evaluated in person, we will make arrangements to do so.  Finally, though the technology is pretty good, we cannot assure that it will always work on either your or our end, and in the setting of a video visit, we may have to convert it to a phone-only visit.  In either situation, we cannot ensure that we have a secure connection.  Are you willing to proceed?" STAFF: Did the patient verbally acknowledge consent to telehealth visit?  Document YES/NO here: YES  4. Advise patient to be prepared - "Two hours prior to your appointment, go ahead and check your blood pressure, pulse, oxygen saturation, and your weight (if you have the equipment to check those) and write them all down. When your visit starts, your provider will ask you for this information. If you have an Apple Watch or Kardia device, please plan to have heart rate information ready on the day of your appointment. Please have a pen and paper handy nearby the day of the visit as well."  5. Give patient instructions for MyChart download to smartphone OR [email or web address] as below if video visit (depending on what platform provider is using)  6. Inform patient they will receive a phone call 15 minutes prior to their appointment time (may be from unknown caller ID) so they should be prepared to answer    TELEPHONE CALL NOTE  Matthew Liu has been deemed a candidate for a follow-up tele-health visit to limit community exposure during the Covid-19 pandemic. I spoke with the patient via phone to ensure availability of phone/video source, confirm preferred email & phone number, and discuss instructions and expectations.  I reminded Matthew Liu to be prepared with any vital sign and/or heart rhythm information that could potentially be obtained via home monitoring, at the time of his visit. I reminded Matthew Liu to expect a phone  call prior to his visit.   Avanell Shackleton 02/08/2019 2:53 PM   INSTRUCTIONS FOR DOWNLOADING THE MYCHART APP TO SMARTPHONE  - The patient must first make sure to have activated MyChart and know their login information - If Apple, go to [employer] and type in MyChart in the search bar and download the app. If Android, ask patient to go to [employer] and type in [place] in the search bar and download the app. The app is free but as with any other app downloads, their phone may require them to verify saved payment information or  Apple/Android password.  - The patient will need to then log into the app with their MyChart username and password, and select [facility] as their healthcare provider to link the account. When it is time for your visit, go to the MyChart app, find appointments, and click Begin Video Visit. Be sure to Select Allow for your device to access the Microphone and Camera for your visit. You will then be connected, and your provider will be with you shortly.  **If they have any issues connecting, or need assistance please contact MyChart service desk [phone])**  **If using a computer, in order to ensure the best quality for their visit they will need to use either of the following Internet Browsers: [employer], or Google Chrome**  IF USING DOXIMITY or DOXY.ME - The patient will receive a link just prior to their visit by text.     FULL LENGTH CONSENT FOR TELE-HEALTH VISIT   I hereby voluntarily request, consent and authorize [facility] and its employed or contracted physicians, physician assistants, nurse practitioners or other licensed health care professionals (the Practitioner), to provide me with telemedicine health care services (the "Services") as deemed necessary by the treating Practitioner. I acknowledge and consent to receive the Services by the Practitioner via telemedicine. I understand that the telemedicine visit will involve communicating with the Practitioner through live audiovisual communication technology and the disclosure of certain medical information by electronic transmission. I acknowledge that I have been given the opportunity to request an in-person assessment or other available alternative prior to the telemedicine visit and am voluntarily participating in the telemedicine visit.  I understand that I have the right to withhold or withdraw my consent to the use of telemedicine in the course of my care at any time, without affecting my right to future care  or treatment, and that the Practitioner or I may terminate the telemedicine visit at any time. I understand that I have the right to inspect all information obtained and/or recorded in the course of the telemedicine visit and may receive copies of available information for a reasonable fee.  I understand that some of the potential risks of receiving the Services via telemedicine include:  [name] Delay or interruption in medical evaluation due to technological equipment failure or disruption; . Information transmitted may not be sufficient (e.g. poor resolution of images) to allow for appropriate medical decision making by the Practitioner; and/or  . In rare instances, security protocols could fail, causing a breach of personal health information.  Furthermore, I acknowledge that it is my responsibility to provide information about my medical history, conditions and care that is complete and accurate to the best of my ability. I acknowledge that Practitioner's advice, recommendations, and/or decision may be based on factors not within their control, such as incomplete or inaccurate data provided by me or distortions of diagnostic images or specimens that may result from  electronic transmissions. I understand that the practice of medicine is not an exact science and that Practitioner makes no warranties or guarantees regarding treatment outcomes. I acknowledge that I will receive a copy of this consent concurrently upon execution via email to the email address I last provided but may also request a printed copy by calling the office of [facility].    I understand that my insurance will be billed for this visit.   I have read or had this consent read to me. . I understand the contents of this consent, which adequately explains the benefits and risks of the Services being provided via telemedicine.  . I have been provided ample opportunity to ask questions regarding this consent and the Services and have had  my questions answered to my satisfaction. . I give my informed consent for the services to be provided through the use of telemedicine in my medical care  By participating in this telemedicine visit I agree to the above.

## 2019-02-08 NOTE — Patient Instructions (Addendum)
After Visit Summary:  We will be checking the following labs today - NONE    Medication Instructions:    Continue with your current medicines.   I have sent in your refills today so you would not run out   If you need a refill on your cardiac medications before your next appointment, please call your pharmacy.     Testing/Procedures To Be Arranged:  N/A  Follow-Up:   See Dr. Tamala Julian in August - plan for fasting labs    At [facility], you and your health needs are our priority.  As part of our continuing mission to provide you with exceptional heart care, we have created designated Provider Care Teams.  These Care Teams include your primary Cardiologist (physician) and Advanced Practice Providers (APPs -  Physician Assistants and Nurse Practitioners) who all work together to provide you with the care you need, when you need it.  Special Instructions:  . Stay safe, stay home, wash your hands for at least 20 seconds and wear a mask when out in public.  . It was good to talk with you today.    Call the [place] office at [phone] if you have any questions, problems or concerns.

## 2019-02-18 ENCOUNTER — Telehealth: Payer: Self-pay | Admitting: Interventional Cardiology

## 2019-02-18 NOTE — Telephone Encounter (Signed)
Noted  

## 2019-02-18 NOTE — Telephone Encounter (Signed)
S/w multiple people including the [facility] the Chartered certified accountant. Pt called in today very frustrated due to pt protonix was one tablet at (20 mg) daily. Pt stated has been taking one tablet, 1 hour before a meal, (40 mg) daily. [place] [employer] stated multiple providers have been sending in the same medications with different dosages.  Clarified all medications with pt.  Lisinopril is one half tablet (20 mg) daily. Dr.Shultz has order this medication.  Pt is cutting this tablet in half. Pt can pick up protonix one tablet (40 mg) daily when ever pt wants it but for now will take two (20 mg) tablets one hour before a meal and use these tablets up.  Will send to [facility] to

## 2019-02-18 NOTE — Telephone Encounter (Signed)
New Message:   Patient calling concerning his medication refill and states it is not right and would like for some one to call him.

## 2019-02-22 ENCOUNTER — Other Ambulatory Visit: Payer: Self-pay | Admitting: Thoracic Surgery (Cardiothoracic Vascular Surgery)

## 2019-02-22 DIAGNOSIS — Z951 Presence of aortocoronary bypass graft: Secondary | ICD-10-CM

## 2019-02-25 ENCOUNTER — Ambulatory Visit (INDEPENDENT_AMBULATORY_CARE_PROVIDER_SITE_OTHER): Payer: Self-pay | Admitting: Thoracic Surgery (Cardiothoracic Vascular Surgery)

## 2019-02-25 ENCOUNTER — Ambulatory Visit
Admission: RE | Admit: 2019-02-25 | Discharge: 2019-02-25 | Disposition: A | Discharge status: Home / Self Care | Payer: Medicare Other | Source: Ambulatory Visit | Attending: Thoracic Surgery (Cardiothoracic Vascular Surgery) | Admitting: Thoracic Surgery (Cardiothoracic Vascular Surgery)

## 2019-02-25 ENCOUNTER — Encounter: Payer: Self-pay | Admitting: Thoracic Surgery (Cardiothoracic Vascular Surgery)

## 2019-02-25 ENCOUNTER — Other Ambulatory Visit: Payer: Self-pay

## 2019-02-25 VITALS — BP 154/84 | HR 54 | Temp 98.4°F | Resp 16 | Ht 69.0 in | Wt 155.0 lb

## 2019-02-25 DIAGNOSIS — Z951 Presence of aortocoronary bypass graft: Secondary | ICD-10-CM

## 2019-02-25 DIAGNOSIS — I2510 Atherosclerotic heart disease of native coronary artery without angina pectoris: Secondary | ICD-10-CM

## 2019-02-25 NOTE — Patient Instructions (Signed)
You may resume unrestricted physical activity without any particular limitations at this time.  Continue all previous medications without any changes at this time

## 2019-02-25 NOTE — Progress Notes (Signed)
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  The patient's early postoperative recovery in the hospital was uneventful and he was discharged home on fifth postoperative day.  He returns to the office today for routine follow-up.  He reports that he is doing very well.  He has never had much of any pain in his chest.  He walks every day and he reports no symptoms of exertional shortness of breath or chest discomfort.  Appetite is good.  He is sleeping well at night.  Overall he has no complaints.  He specifically denies any fevers, cough, or worsening shortness of breath.   Current Outpatient Medications  Medication Sig Dispense Refill  . acetaminophen (TYLENOL) 500 MG tablet Take 1 tablet (500 mg total) by mouth every 6 (six) hours as needed for mild pain or headache. 30 tablet 0  . ALPRAZolam (XANAX) 0.25 MG tablet Take 0.25 mg by mouth 2 (two) times daily as needed for anxiety.     [name] atorvastatin (LIPITOR) 80 MG tablet Take 1 tablet (80 mg total) by mouth every evening. 90 tablet 3  . Cholecalciferol (VITAMIN D) 50 MCG (2000 UT) tablet Take 2,000 Units by mouth daily.    . clopidogrel (PLAVIX) 75 MG tablet Take 75 mg by mouth daily.    . hydrochlorothiazide (MICROZIDE) 12.5 MG capsule Take 1 capsule (12.5 mg total) by mouth daily. 90 capsule 3  .  lisinopril (ZESTRIL) 20 MG tablet Take 1 tablet (20 mg total) by mouth at bedtime. 90 tablet 3  . metoprolol tartrate (LOPRESSOR) 25 MG tablet Take 1 tablet (25 mg total) by mouth 2 (two) times daily. 180 tablet 3  . Multiple Vitamins-Minerals (SENIOR MULTIVITAMIN PLUS PO) Take 1 tablet by mouth every morning.     . pantoprazole (PROTONIX) 40 MG tablet Take 1 tablet (40 mg total) by mouth daily. 90 tablet 3  . traMADol (ULTRAM) 50 MG tablet Take 1 tablet (50 mg total) by mouth every 6 (six) hours as needed for severe pain. (Patient not taking: Reported on 02/25/2019) 28 tablet 0   No current facility-administered medications for this visit.       Physical Exam:   BP (!) 154/84 (BP Location: Left Arm, Patient Position: Sitting, Cuff Size: Normal)   Pulse (!) 54   Temp 98.4 F (36.9 C) (Oral)   Resp 16   Ht 5\' 9"  (1.753 m)   Wt 155 lb (70.3 kg)   SpO2 95% Comment: ON RA  BMI 22.89 kg/m   General:  Well-appearing  Chest:   Clear to auscultation  CV:   Regular rate and rhythm  Incisions:  Completely healed, sternum is stable  Abdomen:  Soft nontender  Extremities:  Warm and well-perfused  Diagnostic Tests:  CHEST - 2 VIEW  COMPARISON:  12/15/2018  FINDINGS: CABG changes. Heart size upper normal. Normal vascularity. No infiltrate or effusion.  Chronic fracture with vertebroplasty approximately L1 level.  IMPRESSION: No active cardiopulmonary disease.   Electronically Signed   By: Franchot Gallo M.D.   On: 02/25/2019 10:29   Impression:  Patient is doing very well nearly 53-month status post coronary artery bypass grafting  Plan:  We have not recommended any change the patient's current medications.  I have encouraged the patient to continue to gradually increase his physical activity without any particular limitations.  The patient will continue to follow-up regularly with Dr. Tamala Julian and return to our office for routine follow-up next January, approximately 1  year following surgery.  He will call and return sooner should specific problems or questions arise.    Valentina Gu. Roxy Manns, MD 02/25/2019 10:50 AM

## 2019-03-31 ENCOUNTER — Other Ambulatory Visit: Payer: Self-pay | Admitting: Interventional Cardiology

## 2019-04-20 ENCOUNTER — Other Ambulatory Visit: Payer: Self-pay | Admitting: Interventional Cardiology

## 2019-05-23 ENCOUNTER — Other Ambulatory Visit: Payer: Self-pay

## 2019-05-23 ENCOUNTER — Emergency Department (HOSPITAL_COMMUNITY)
Admission: EM | Admit: 2019-05-23 | Discharge: 2019-05-23 | Disposition: A | Discharge status: Home / Self Care | Payer: Medicare Other | Attending: Emergency Medicine | Admitting: Emergency Medicine

## 2019-05-23 ENCOUNTER — Emergency Department (HOSPITAL_COMMUNITY): Payer: Medicare Other

## 2019-05-23 ENCOUNTER — Encounter (HOSPITAL_COMMUNITY): Payer: Self-pay | Admitting: Emergency Medicine

## 2019-05-23 DIAGNOSIS — Y92012 Bathroom of single-family (private) house as the place of occurrence of the external cause: Secondary | ICD-10-CM | POA: Insufficient documentation

## 2019-05-23 DIAGNOSIS — I10 Essential (primary) hypertension: Secondary | ICD-10-CM | POA: Insufficient documentation

## 2019-05-23 DIAGNOSIS — Y93E1 Activity, personal bathing and showering: Secondary | ICD-10-CM | POA: Diagnosis not present

## 2019-05-23 DIAGNOSIS — S39012A Strain of muscle, fascia and tendon of lower back, initial encounter: Secondary | ICD-10-CM | POA: Diagnosis not present

## 2019-05-23 DIAGNOSIS — T148XXA Other injury of unspecified body region, initial encounter: Secondary | ICD-10-CM

## 2019-05-23 DIAGNOSIS — Z79899 Other long term (current) drug therapy: Secondary | ICD-10-CM | POA: Diagnosis not present

## 2019-05-23 DIAGNOSIS — X500XXA Overexertion from strenuous movement or load, initial encounter: Secondary | ICD-10-CM | POA: Diagnosis not present

## 2019-05-23 DIAGNOSIS — I252 Old myocardial infarction: Secondary | ICD-10-CM | POA: Insufficient documentation

## 2019-05-23 DIAGNOSIS — M545 Low back pain, unspecified: Secondary | ICD-10-CM

## 2019-05-23 DIAGNOSIS — Z7901 Long term (current) use of anticoagulants: Secondary | ICD-10-CM | POA: Diagnosis not present

## 2019-05-23 DIAGNOSIS — S32010D Wedge compression fracture of first lumbar vertebra, subsequent encounter for fracture with routine healing: Secondary | ICD-10-CM | POA: Insufficient documentation

## 2019-05-23 DIAGNOSIS — Y999 Unspecified external cause status: Secondary | ICD-10-CM | POA: Insufficient documentation

## 2019-05-23 MED ORDER — ACETAMINOPHEN 325 MG PO TABS: 650.0000 mg | ORAL_TABLET | Freq: Four times a day (QID) | ORAL | 0 refills | Status: DC | PRN

## 2019-05-23 MED ORDER — ACETAMINOPHEN 325 MG PO TABS
650.0000 mg | ORAL_TABLET | Freq: Once | ORAL | Status: AC
  Administered 2019-05-23: 650 mg via ORAL
  Filled 2019-05-23: qty 2

## 2019-05-23 MED ORDER — LIDOCAINE 5 % EX PTCH
2.0000 | MEDICATED_PATCH | CUTANEOUS | Status: DC
  Administered 2019-05-23: 2 via TRANSDERMAL
  Filled 2019-05-23: qty 2

## 2019-05-23 MED ORDER — LIDOCAINE 4 % EX PTCH: 2.0000 | MEDICATED_PATCH | Freq: Every morning | CUTANEOUS | 2 refills | Status: DC

## 2019-05-23 NOTE — ED Provider Notes (Signed)
[facility] EMERGENCY DEPARTMENT Provider Note   CSN: [id number] Arrival date & time: 05/23/19  1636    History   Chief Complaint Chief Complaint  Patient presents with  . Back Pain    HPI Matthew Liu is a 78 y.o. male.     HPI Patient presents from home for evaluation of back pain.  He reports that he has been well lately, this afternoon took a shower and was reaching up to drape the towel and when he leaned backwards he felt a pop or sensation of something giving way in his back and had immediate onset severe sharp pain.  States it was so severe that he felt nauseous.  Made his way to his bed and was able to ambulate.  Laid down and has had persistent pain since then when standing or moving.  Reports that it will completely resolve if he finds the right position and rests.  Denies numbness, tingling, weakness, or difficulty urinating.  Denies other recent trauma.  Took tramadol prior to arrival.  Did not feel like it helped much.  Is concerned because he had spinal fracture last year that required operative intervention.  Past Medical History:  Diagnosis Date  . Acute ischemic stroke ([place]) 09/15/2015  . Ankle fracture, left 2009   rod placed  . Anxiety   . Aspirin allergy    a. Anaphylactic rxn.  . Barrett's esophagus   . CAD (coronary artery disease)    a. Inf STEMI 07/2013: s/p DES x 2 from ostial to [facility] 07/13/13. Residual Cx disease for med rx (PCI if recurrent pain).  . Dyslipidemia   . ED (erectile dysfunction)   . GERD (gastroesophageal reflux disease)   . History of nuclear stress test    Nuclear stress test 5/19: EF 53, no ischemia, no infarction; Low Risk  . Hydrocele of testis   . Hyperglycemia    a. A1C 5.8 in 07/2013.  [name] Hypertension   . Myocardial infarction ([place])   . 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 Coronary artery disease involving native coronary artery of native heart with unstable angina pectoris ([place]) 12/11/2018  . Unstable angina ([place])   . Lumbar compression fracture ([place]) 01/09/2018  . Lacunar stroke ([place]) 10/13/2015  . Acute ischemic stroke ([place]) 09/15/2015  . Ankle fracture, left   . OSA (obstructive sleep apnea)   . Erectile dysfunction 12/01/2014  . Aspirin allergy 07/16/2013  . Dyslipidemia 07/16/2013  . 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 HTN (hypertension) 07/13/2013  . GERD (gastroesophageal reflux disease) 07/13/2013  . Barrett's esophagus 07/13/2013    Past Surgical History:  Procedure Laterality Date  . cataract surgery      bilateral   . CORONARY ANGIOPLASTY     stents   . CORONARY ARTERY BYPASS GRAFT N/A 12/12/2018   Procedure: CORONARY ARTERY BYPASS GRAFTING (CABG);  Surgeon: Rexene Alberts, MD;  Location: [facility];  Service: Open Heart Surgery;  Laterality: N/A;  . HYDROCELE EXCISION Left 07/17/2014   Procedure: LEFT HYDROCELECTOMY;  Surgeon: Malka So, MD;  Location: [facility];  Service: Urology;  Laterality: Left;  . left ankle surgery  1999  . LEFT HEART CATH AND CORONARY ANGIOGRAPHY N/A  12/11/2018   Procedure: LEFT HEART CATH AND CORONARY ANGIOGRAPHY;  Surgeon: Belva Crome, MD;  Location: [facility] CV LAB;  Service: Cardiovascular;  Laterality: N/A;  . LEFT HEART CATHETERIZATION WITH CORONARY ANGIOGRAM N/A 07/13/2013   Procedure: LEFT HEART CATHETERIZATION WITH CORONARY ANGIOGRAM;  Surgeon: Sinclair Grooms, MD;  Location: [facility] CATH LAB;  Service: Cardiovascular;  Laterality: N/A;  . PERCUTANEOUS CORONARY STENT INTERVENTION (PCI-S)  07/13/2013   Procedure: PERCUTANEOUS CORONARY STENT INTERVENTION (PCI-S);  Surgeon: Sinclair Grooms, MD;  Location: [facility] CATH LAB;  Service: Cardiovascular;;  . TEE WITHOUT CARDIOVERSION N/A 12/12/2018   Procedure: TRANSESOPHAGEAL ECHOCARDIOGRAM (TEE);   Surgeon: Rexene Alberts, MD;  Location: [facility];  Service: Open Heart Surgery;  Laterality: N/A;        Home Medications    Prior to Admission medications   Medication Sig Start Date End Date Taking? Authorizing Provider  acetaminophen (TYLENOL) 500 MG tablet Take 1 tablet (500 mg total) by mouth every 6 (six) hours as needed for mild pain or headache. 01/12/18  Yes Rayburn, Floyce Stakes, PA-C  ALPRAZolam (XANAX) 0.25 MG tablet Take 0.25 mg by mouth 2 (two) times daily as needed for anxiety.  06/29/13  Yes [provider]  atorvastatin (LIPITOR) 80 MG tablet Take 1 tablet (80 mg total) by mouth every evening. 02/08/19  Yes Burtis Junes, NP  Cholecalciferol (VITAMIN D3) 50 MCG (2000 UT) capsule Take 2,000 Units by mouth daily.   Yes [provider]  clopidogrel (PLAVIX) 75 MG tablet TAKE 1 TABLET BY MOUTH EVERY DAY Patient taking differently: Take 75 mg by mouth daily.  04/22/19  Yes Belva Crome, MD  hydrochlorothiazide (MICROZIDE) 12.5 MG capsule Take 1 capsule (12.5 mg total) by mouth daily. 02/08/19  Yes Burtis Junes, NP  lisinopril (ZESTRIL) 40 MG tablet Take 20 mg by mouth at bedtime.  02/16/19  Yes [provider]  metoprolol tartrate (LOPRESSOR) 25 MG tablet Take 1 tablet (25 mg total) by mouth 2 (two) times daily. 02/08/19  Yes Burtis Junes, NP  Multiple Vitamins-Minerals (ONE-A-DAY MENS 50+) TABS Take 1 tablet by mouth daily with breakfast.   Yes [provider]  pantoprazole (PROTONIX) 40 MG tablet Take 1 tablet (40 mg total) by mouth daily. 02/08/19  Yes Burtis Junes, NP  acetaminophen (TYLENOL) 325 MG tablet Take 2 tablets (650 mg total) by mouth every 6 (six) hours as needed for moderate pain. 05/23/19   Tillie Fantasia, MD  Lidocaine (LIDOCAINE PAIN RELIEF) 4 % PTCH Apply 2 patches topically every morning. Apply to lower back over site of pain 05/23/19   Tillie Fantasia, MD  lisinopril (ZESTRIL) 20 MG tablet Take 1 tablet (20 mg total) by mouth at  bedtime. Patient not taking: Reported on 05/23/2019 02/08/19   Burtis Junes, NP  traMADol (ULTRAM) 50 MG tablet Take 1 tablet (50 mg total) by mouth every 6 (six) hours as needed for severe pain. Patient not taking: Reported on 05/23/2019 01/16/19   Rexene Alberts, MD    Family History Family History  Problem Relation Age of Onset  . Hypertension Mother   . Stroke Neg Hx     Social History Social History   Tobacco Use  . Smoking status: Never Smoker  . Smokeless tobacco: Never Used  Substance Use Topics  . Alcohol use: Yes    Comment: occasional beer   . Drug use: No     Allergies   Aspirin and Quinine  derivatives   Review of Systems Review of Systems  Constitutional: Negative for chills and fever.  HENT: Negative for ear pain and sore throat.   Eyes: Negative for pain and visual disturbance.  Respiratory: Negative for cough and shortness of breath.   Cardiovascular: Negative for chest pain and palpitations.  Gastrointestinal: Negative for abdominal pain and vomiting.  Genitourinary: Negative for dysuria and hematuria.  Musculoskeletal: Positive for back pain. Negative for arthralgias.  Skin: Negative for color change and rash.  Neurological: Negative for seizures and syncope.  All other systems reviewed and are negative.    Physical Exam Updated Vital Signs BP 138/81   Pulse 72   Temp 98.2 F (36.8 C) (Oral)   Resp 16   SpO2 100%   Physical Exam Vitals signs and nursing note reviewed.  Constitutional:      Appearance: He is well-developed.  HENT:     Head: Normocephalic and atraumatic.  Eyes:     Conjunctiva/sclera: Conjunctivae normal.  Neck:     Musculoskeletal: Neck supple.  Cardiovascular:     Rate and Rhythm: Normal rate and regular rhythm.     Heart sounds: Gallop present.   Pulmonary:     Effort: Pulmonary effort is normal. No respiratory distress.     Breath sounds: Normal breath sounds.  Abdominal:     Palpations: Abdomen is soft.      Tenderness: There is no abdominal tenderness.  Musculoskeletal:     Comments: No pain to midline or paraspinal tenderness.  He points to the L4-L5 interspace and states that the pain is at that level and goes into the bilateral hips.  No pain with hip or lumbar flexion that he does have pain with extension of the lumbar spine.  +3 patellar reflexes.  Full strength with hip abduction and knee/ankle extension and flexion.  Skin:    General: Skin is warm and dry.  Neurological:     Mental Status: He is alert.      ED Treatments / Results  Labs (all labs ordered are listed, but only abnormal results are displayed) Labs Reviewed - No data to display  EKG None  Radiology Dg Lumbar Spine 2-3 Views  Result Date: 05/23/2019 CLINICAL DATA:  Abrupt onset of back pain EXAM: LUMBAR SPINE - 2-3 VIEW COMPARISON:  CT 01/08/2018 FINDINGS: Treated compression fracture at L1. Remaining vertebral bodies demonstrate normal stature. Moderate diffuse degenerative change at L2-L3, L3-L4, L4-L5 and L5-S1. Moderate degenerative change at L1-L2. Dense aortic atherosclerosis. 3.7 cm AP diameter of the distal infrarenal abdominal aorta. IMPRESSION: 1. No acute osseous abnormality. Multiple level degenerative changes. Treated chronic compression fracture L1 2. 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 Electronically Signed   By: Donavan Foil M.D.   On: 05/23/2019 22:17    Procedures Procedures (including critical care time)  Medications Ordered in ED Medications  lidocaine (LIDODERM) 5 % 2 patch (2 patches Transdermal Patch Applied 05/23/19 2223)  acetaminophen (TYLENOL) tablet 650 mg (650 mg Oral Given 05/23/19 2148)     Initial Impression / Assessment and Plan / ED Course  I have reviewed the triage vital signs and the nursing notes.  Pertinent labs & imaging results that were available during my care of the patient were reviewed by me and considered  in my medical decision making (see chart for details).        Mr Mcparland is a 78 year old male with a hx  of previous L1 compression fracture, CABG 5 months ago. I have reviewed his medical record.   Here with non-traumatic back pain.  X-rays negative for fracture or malalignment.  His exam is reassuring.  No neurologic symptoms.  He does have aortic calcification and aneurysm on x-ray, but no palpable abdominal pulsation, and history of pain is much more consistent with musculoskeletal etiology as it is improved with positioning, resolves completely at times, and is not associated with hypertension, diaphoresis, or other signs of vascular complication.  Overall his presentation is reassuring.  Appropriate for discharge without further dimension.  Gave Tylenol and lidocaine patches for pain which I recommended for outpatient course.  Will follow-up with Dr. Trenton Gammon his surgeon as needed.  Patient vocalized understanding and agreement with plan. Had no other questions or concerns. Was given relevant verbal and written information regarding aftercare and return precautions and discharged in good condition.    Final Clinical Impressions(s) / ED Diagnoses   Final diagnoses:  Acute bilateral low back pain without sciatica  Muscle strain  Compression fracture of L1 vertebra with routine healing, subsequent encounter    ED Discharge Orders         Ordered    acetaminophen (TYLENOL) 325 MG tablet  Every 6 hours PRN     05/23/19 2256    Lidocaine (LIDOCAINE PAIN RELIEF) 4 % PTCH   Every morning - 10a     05/23/19 2256           Tillie Fantasia, MD 05/24/19 [id number]    Quintella Reichert, MD 05/24/19 [phone]

## 2019-05-23 NOTE — ED Notes (Signed)
Patient verbalizes understanding of discharge instructions. Opportunity for questioning and answers were provided. Armband removed by staff, pt discharged from ED in wheelchair.  

## 2019-05-23 NOTE — ED Triage Notes (Signed)
Pt reports having back surgery last year, CABG in March. Pt reports he was reaching to put a shower curtain rod up and he felt a pop in his back. Pt reports severe back pain. Pt reports pain to bilateral hips with ambulation also. Pt ambulatory to triage.

## 2019-06-06 ENCOUNTER — Other Ambulatory Visit: Payer: Self-pay | Admitting: Student

## 2019-06-06 DIAGNOSIS — S3992XA Unspecified injury of lower back, initial encounter: Secondary | ICD-10-CM

## 2019-06-07 ENCOUNTER — Other Ambulatory Visit: Payer: Self-pay

## 2019-06-07 ENCOUNTER — Ambulatory Visit: Payer: Medicare Other | Admitting: Interventional Cardiology

## 2019-06-07 ENCOUNTER — Ambulatory Visit
Admission: RE | Admit: 2019-06-07 | Discharge: 2019-06-07 | Disposition: A | Discharge status: Home / Self Care | Payer: Medicare Other | Source: Ambulatory Visit | Attending: Student | Admitting: Student

## 2019-06-07 DIAGNOSIS — S3992XA Unspecified injury of lower back, initial encounter: Secondary | ICD-10-CM

## 2019-06-10 ENCOUNTER — Other Ambulatory Visit: Payer: Self-pay | Admitting: Neurosurgery

## 2019-06-10 ENCOUNTER — Other Ambulatory Visit (HOSPITAL_COMMUNITY): Payer: Self-pay | Admitting: Neurosurgery

## 2019-06-12 ENCOUNTER — Telehealth: Payer: Self-pay

## 2019-06-12 NOTE — Progress Notes (Signed)
Virtual Visit via Video Note   This visit type was conducted due to national recommendations for restrictions regarding the COVID-19 Pandemic (e.g. social distancing) in an effort to limit this patient's exposure and mitigate transmission in our community.  Due to his co-morbid illnesses, this patient is at least at moderate risk for complications without adequate follow up.  This format is felt to be most appropriate for this patient at this time.  All issues noted in this document were discussed and addressed.  A limited physical exam was performed with this format.  Please refer to the patient's chart for his consent to telehealth for [facility].   Date:  06/12/2019   ID:  Matthew Liu, DOB [birth year], MRN [id number]  Patient Location: Home Provider Location: Home  PCP:  Nicoletta Dress, MD  Cardiologist:  Sinclair Grooms, MD  Electrophysiologist:  None   Evaluation Performed:  Follow-Up Visit  Chief Complaint:  CAD  History of Present Illness:    Matthew Liu is a 78 y.o. male with right coronary ostial and mid vessel stenting during acute inferior infarction 2014, residual complex mid and distal circumflex disease not demonstrated to cause ischemia by scintigraphy in 2015,hyperlipidemia, CVA, and hypertension.  Unstable angina 12/2018 led to CABG with 5 vessel coronary bypass including LIMA to LAD, saphenous vein graft to distal RCA, saphenous vein graft to OM, saphenous vein graft to OM 3, and saphenous vein graft to diagonal.  He is doing well heart wise. Was in cardiac rehab but due to back pain he has stopped. Breathing okay. No angina.  The patient does not have symptoms concerning for COVID-19 infection (fever, chills, cough, or new shortness of breath).    Past Medical History:  Diagnosis Date  . Acute ischemic stroke ([place]) 09/15/2015  . Ankle fracture, left 2009   rod placed  . Anxiety   . Aspirin allergy    a. Anaphylactic rxn.  . Barrett's esophagus   .  CAD (coronary artery disease)    a. Inf STEMI 07/2013: s/p DES x 2 from ostial to [facility] 07/13/13. Residual Cx disease for med rx (PCI if recurrent pain).  . Dyslipidemia   . ED (erectile dysfunction)   . GERD (gastroesophageal reflux disease)   . History of nuclear stress test    Nuclear stress test 5/19: EF 53, no ischemia, no infarction; Low Risk  . Hydrocele of testis   . Hyperglycemia    a. A1C 5.8 in 07/2013.  [name] Hypertension   . Myocardial infarction ([place])   . 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 cataract surgery      bilateral   . CORONARY ANGIOPLASTY     stents   . CORONARY ARTERY BYPASS GRAFT N/A 12/12/2018   Procedure: CORONARY ARTERY BYPASS GRAFTING (CABG);  Surgeon: Rexene Alberts, MD;  Location: [facility];  Service: Open Heart Surgery;  Laterality: N/A;  . HYDROCELE EXCISION Left 07/17/2014   Procedure: LEFT HYDROCELECTOMY;  Surgeon: Malka So, MD;  Location: [facility];  Service: Urology;  Laterality: Left;  . left ankle surgery  1999  . LEFT HEART CATH AND CORONARY ANGIOGRAPHY N/A 12/11/2018   Procedure: LEFT HEART CATH AND CORONARY ANGIOGRAPHY;  Surgeon: Belva Crome, MD;  Location: [place] CV LAB;  Service: Cardiovascular;  Laterality:  N/A;  . LEFT HEART CATHETERIZATION WITH CORONARY ANGIOGRAM N/A 07/13/2013   Procedure: LEFT HEART CATHETERIZATION WITH CORONARY ANGIOGRAM;  Surgeon: Sinclair Grooms, MD;  Location: [facility] CATH LAB;  Service: Cardiovascular;  Laterality: N/A;  . PERCUTANEOUS CORONARY STENT INTERVENTION (PCI-S)  07/13/2013   Procedure: PERCUTANEOUS CORONARY STENT INTERVENTION (PCI-S);  Surgeon: Sinclair Grooms, MD;  Location: [facility] CATH LAB;  Service: Cardiovascular;;  . TEE WITHOUT CARDIOVERSION N/A 12/12/2018   Procedure: TRANSESOPHAGEAL ECHOCARDIOGRAM (TEE);  Surgeon: Rexene Alberts, MD;  Location: [facility];  Service: Open Heart  Surgery;  Laterality: N/A;     No outpatient medications have been marked as taking for the 06/13/19 encounter (Appointment) with Belva Crome, MD.     Allergies:   Aspirin and Quinine derivatives   Social History   Tobacco Use  . Smoking status: Never Smoker  . Smokeless tobacco: Never Used  Substance Use Topics  . Alcohol use: Yes    Comment: occasional beer   . Drug use: No     Family Hx: The patient's family history includes Hypertension in his mother. There is no history of Stroke.  ROS:   Please see the history of present illness.    Hurt his back 4-6 months ago. Bone density done by PCP. All other systems reviewed and are negative.   Prior CV studies:   The following studies were reviewed today:  Coronary artery bypass grafting March 2020  Coronary Artery Bypass Grafting x5 Left Internal Mammary Artery to Distal Left Anterior Descending Coronary Artery Saphenous Vein Graft to Distal RightCoronary Artery Saphenous Vein Graft to SecondObtuse Marginal Branch of Left Circumflex Coronary Artery Sequential Saphenous Vein Graft toThirdObtuse Marginal Branch of Left Circumflex Coronary Artery Sapheonous Vein Graft to Diagonal Branch Coronary Artery  Labs/Other Tests and Data Reviewed:    EKG:  No ECG reviewed.  Recent Labs: 12/11/2018: ALT 17 12/13/2018: Magnesium 2.5 12/17/2018: BUN 22; Creatinine, Ser 1.05; Hemoglobin 8.4; Platelets 145; Potassium 3.5; Sodium 138   Recent Lipid Panel Lab Results  Component Value Date/Time   CHOL 108 03/20/2017 01:31 PM   TRIG 87 03/20/2017 01:31 PM   HDL 45 03/20/2017 01:31 PM   CHOLHDL 2.4 03/20/2017 01:31 PM   CHOLHDL 2.7 09/15/2015 02:05 AM   LDLCALC 46 03/20/2017 01:31 PM    Wt Readings from Last 3 Encounters:  02/25/19 155 lb (70.3 kg)  02/08/19 155 lb (70.3 kg)  12/16/18 167 lb 1.7 oz (75.8 kg)     Objective:    Vital Signs:  There were no vitals  taken for this visit.   VITAL SIGNS:  reviewed  ASSESSMENT & PLAN:    1. Coronary artery disease of native artery of native heart with stable angina pectoris ([place])   2. Dyslipidemia   3. Essential hypertension   4. OSA (obstructive sleep apnea)   5. Educated About Covid-19 Virus Infection    PLAN:  1. Secondary prevention discussed. 2. LDL will need to be checked at OV, 3. Blood pressure is well controlled 4. CPAP is encouraged 5. Social distancing, mask wearing, and handwashing encouraged  Overall education and awareness concerning primary/secondary risk prevention was discussed in detail: LDL less than 70, hemoglobin A1c less than 7, blood pressure target less than 130/80 mmHg, >150 minutes of moderate aerobic activity per week, avoidance of smoking, weight control (via diet and exercise), and continued surveillance/management of/for obstructive sleep apnea.   COVID-19 Education: The signs and symptoms of COVID-19 were discussed with the patient  and how to seek care for testing (follow up with PCP or arrange E-visit).  The importance of social distancing was discussed today.  Time:   Today, I have spent 12 minutes with the patient with telehealth technology discussing the above problems.     Medication Adjustments/Labs and Tests Ordered: Current medicines are reviewed at length with the patient today.  Concerns regarding medicines are outlined above.   Tests Ordered: No orders of the defined types were placed in this encounter.   Medication Changes: No orders of the defined types were placed in this encounter.   Follow Up:  In Person in 6 month(s)  Signed, Sinclair Grooms, MD  06/12/2019 7:39 PM    [place]

## 2019-06-12 NOTE — Telephone Encounter (Signed)

## 2019-06-13 ENCOUNTER — Encounter: Payer: Self-pay | Admitting: Interventional Cardiology

## 2019-06-13 ENCOUNTER — Telehealth (INDEPENDENT_AMBULATORY_CARE_PROVIDER_SITE_OTHER): Payer: Medicare Other | Admitting: Interventional Cardiology

## 2019-06-13 ENCOUNTER — Other Ambulatory Visit: Payer: Self-pay

## 2019-06-13 VITALS — BP 122/72 | HR 60 | Ht 69.0 in | Wt 154.0 lb

## 2019-06-13 DIAGNOSIS — I25118 Atherosclerotic heart disease of native coronary artery with other forms of angina pectoris: Secondary | ICD-10-CM | POA: Diagnosis not present

## 2019-06-13 DIAGNOSIS — E785 Hyperlipidemia, unspecified: Secondary | ICD-10-CM

## 2019-06-13 DIAGNOSIS — G4733 Obstructive sleep apnea (adult) (pediatric): Secondary | ICD-10-CM

## 2019-06-13 DIAGNOSIS — I10 Essential (primary) hypertension: Secondary | ICD-10-CM

## 2019-06-13 DIAGNOSIS — Z7189 Other specified counseling: Secondary | ICD-10-CM

## 2019-06-13 NOTE — Patient Instructions (Signed)

## 2019-06-18 ENCOUNTER — Other Ambulatory Visit: Payer: Self-pay | Admitting: Student

## 2019-06-18 DIAGNOSIS — M549 Dorsalgia, unspecified: Secondary | ICD-10-CM

## 2019-06-27 ENCOUNTER — Encounter (HOSPITAL_COMMUNITY)
Admission: RE | Admit: 2019-06-27 | Discharge: 2019-06-27 | Disposition: A | Discharge status: Home / Self Care | Payer: Medicare Other | Source: Ambulatory Visit | Attending: Student | Admitting: Student

## 2019-06-27 ENCOUNTER — Other Ambulatory Visit: Payer: Self-pay | Admitting: Student

## 2019-06-27 ENCOUNTER — Other Ambulatory Visit: Payer: Self-pay

## 2019-06-27 DIAGNOSIS — S3992XA Unspecified injury of lower back, initial encounter: Secondary | ICD-10-CM | POA: Insufficient documentation

## 2019-06-27 DIAGNOSIS — M549 Dorsalgia, unspecified: Secondary | ICD-10-CM

## 2019-06-27 MED ORDER — TECHNETIUM TC 99M MEDRONATE IV KIT
21.4000 | PACK | Freq: Once | INTRAVENOUS | Status: AC | PRN
  Administered 2019-06-27: 21.4 via INTRAVENOUS

## 2019-11-06 DIAGNOSIS — Z9889 Other specified postprocedural states: Secondary | ICD-10-CM | POA: Diagnosis not present

## 2019-12-28 ENCOUNTER — Other Ambulatory Visit: Payer: Self-pay | Admitting: Interventional Cardiology

## 2019-12-30 ENCOUNTER — Encounter: Payer: Medicare Other | Admitting: Thoracic Surgery (Cardiothoracic Vascular Surgery)

## 2020-01-06 ENCOUNTER — Telehealth (INDEPENDENT_AMBULATORY_CARE_PROVIDER_SITE_OTHER): Payer: Medicare PPO | Admitting: Thoracic Surgery (Cardiothoracic Vascular Surgery)

## 2020-01-06 ENCOUNTER — Other Ambulatory Visit: Payer: Self-pay

## 2020-01-06 DIAGNOSIS — Z951 Presence of aortocoronary bypass graft: Secondary | ICD-10-CM | POA: Diagnosis not present

## 2020-01-06 NOTE — Progress Notes (Signed)
[address]       [place] [address]             [phone]     CARDIOTHORACIC SURGERY TELEPHONE VIRTUAL OFFICE NOTE  Primary Cardiologist is Sinclair Grooms, MD PCP is Nicoletta Dress, MD   HPI:  I spoke with Matthew Liu (DOB 1941/06/16 ) via telephone on 01/06/2020 at 11:23 AM and verified that I was speaking with the correct person using more than one form of identification.  We discussed the reason(s) for conducting our visit virtually instead of in-person.  The patient expressed understanding the circumstances and agreed to proceed as described.   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 Patient was last seen here in the office on Feb 25, 2019 at which time he was doing well and recovering uneventfully.  I spoke with the patient over the telephone today to check in and see how he is doing now that it has been a full year since his surgery.  He states that he is doing very well from a cardiac standpoint.  He continues to suffer from chronic back pain for which he is followed closely by Dr. Trenton Gammon.  However, he is getting around reasonably well physically and he specifically denies any symptoms of exertional shortness of breath or chest pain.  He had a telemedicine visit with Dr. Tamala Julian last September and plans to return to see Dr. Tamala Julian for routine follow-up within the next month.  Other than his chronic back pain he reports that he is doing well.  He states that his blood pressure has been under fairly good control.   Current Outpatient Medications  Medication Sig  Dispense Refill  . acetaminophen (TYLENOL) 500 MG tablet Take 1 tablet (500 mg total) by mouth every 6 (six) hours as needed for mild pain or headache. 30 tablet 0  . ALPRAZolam (XANAX) 0.25 MG tablet Take 0.25 mg by mouth 2 (two) times daily as needed for anxiety.     [name] atorvastatin (LIPITOR) 80 MG tablet Take 1 tablet (80 mg total) by mouth every evening. 90 tablet 3  . Cholecalciferol (VITAMIN D3) 50 MCG (2000 UT) capsule Take 2,000 Units by mouth daily.    . clopidogrel (PLAVIX) 75 MG tablet TAKE 1 TABLET BY MOUTH EVERY DAY 90 tablet 1  . hydrochlorothiazide (MICROZIDE) 12.5 MG capsule Take 1 capsule (12.5 mg total) by mouth daily. 90 capsule 3  . Lidocaine (LIDOCAINE PAIN RELIEF) 4 % PTCH Apply 2 patches topically every morning. Apply to lower back over site of pain 15 patch 2  . lisinopril (ZESTRIL) 40 MG tablet Take 20 mg by mouth at bedtime.     . metoprolol tartrate (LOPRESSOR) 25 MG tablet Take 1 tablet (25 mg total) by mouth 2 (two) times daily. 180 tablet 3  . Multiple Vitamins-Minerals (ONE-A-DAY MENS 50+) TABS Take 1 tablet by mouth daily with breakfast.    . pantoprazole (PROTONIX) 40 MG tablet Take 1 tablet (40 mg total) by mouth daily. 90 tablet 3  . traMADol (ULTRAM) 50 MG tablet Take 1 tablet (50 mg total) by mouth every 6 (six)  hours as needed for severe pain. 28 tablet 0   No current facility-administered medications for this visit.     Diagnostic Tests:  n/a   Impression:  Patient is doing well approximately 1 year status post coronary artery bypass grafting  Plan:  We have not recommended any changes to the patient's current medications.  We discussed the many benefits of regular exercise and a heart healthy diet.  Close attention to monitoring his blood pressure has been discussed.  All of his questions have been addressed.  In the future he will call and return to see [place] as needed.    I discussed limitations of evaluation and management via telephone.  The  patient was advised to call back for repeat telephone consultation or to seek an in-person evaluation if questions arise or the patient's clinical condition changes in any significant manner.  I spent in excess of 5 minutes of non-face-to-face time during the conduct of this telephone virtual office consultation.     Valentina Gu. Roxy Manns, MD 01/06/2020 11:23 AM

## 2020-01-06 NOTE — Patient Instructions (Addendum)
Continue all previous medications without any changes at this time  Check your blood pressure on a regular basis and keep a log for your records.  Discussed with your cardiologist and/or primary care physician whether or not your blood pressure medications should be adjusted.  Make every effort to stay physically active, get some type of exercise on a regular basis, and stick to a "heart healthy diet".  The long term benefits for regular exercise and a healthy diet are critically important to your overall health and wellbeing.

## 2020-01-23 NOTE — Progress Notes (Signed)
Cardiology Office Note:    Date:  01/24/2020   ID:  Matthew Liu, DOB [birth year], MRN [id number]  PCP:  Nicoletta Dress, MD  Cardiologist:  Sinclair Grooms, MD   Referring MD: Nicoletta Dress, MD   Chief Complaint  Patient presents with  . Coronary Artery Disease    History of Present Illness:    Matthew Liu is a 79 y.o. male with a hx of  right coronary ostial and mid vessel stenting during acute inferior infarction 2014, residual complex mid and distal circumflex disease not demonstrated to cause ischemia by scintigraphy in 2015,hyperlipidemia, CVA, and hypertension.  Unstable angina 12/2018 led to CABG with 5 vessel coronary bypass including LIMA to LAD, saphenous vein graft to distal RCA, saphenous vein graft to OM, saphenous vein graft to OM 3, and saphenous vein graft to diagonal.  No cardiac concerns.  Feels he has had relatively recent blood work.  Last blood work that we can find was from March 2020.  Since bypass surgery he has had no angina, shortness of breath, or significant problems.  Breathing is being great.  No lower extremity swelling.  Past Medical History:  Diagnosis Date  . Acute ischemic stroke ([place]) 09/15/2015  . Ankle fracture, left 2009   rod placed  . Anxiety   . Aspirin allergy    a. Anaphylactic rxn.  . Barrett's esophagus   . CAD (coronary artery disease)    a. Inf STEMI 07/2013: s/p DES x 2 from ostial to [facility] 07/13/13. Residual Cx disease for med rx (PCI if recurrent pain).  . Dyslipidemia   . ED (erectile dysfunction)   . GERD (gastroesophageal reflux disease)   . History of nuclear stress test    Nuclear stress test 5/19: EF 53, no ischemia, no infarction; Low Risk  . Hydrocele of testis   . Hyperglycemia    a. A1C 5.8 in 07/2013.  [name] Hypertension   . Myocardial infarction ([place])   . 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 cataract surgery      bilateral   . CORONARY ANGIOPLASTY     stents   . CORONARY ARTERY BYPASS GRAFT N/A 12/12/2018   Procedure: CORONARY ARTERY BYPASS GRAFTING (CABG);  Surgeon: Rexene Alberts, MD;  Location: [facility];  Service: Open Heart Surgery;  Laterality: N/A;  . HYDROCELE EXCISION Left 07/17/2014   Procedure: LEFT HYDROCELECTOMY;  Surgeon: Malka So, MD;  Location: [facility];  Service: Urology;  Laterality: Left;  . left ankle surgery  1999  . LEFT HEART CATH AND CORONARY ANGIOGRAPHY N/A 12/11/2018   Procedure: LEFT HEART CATH AND CORONARY ANGIOGRAPHY;  Surgeon: Belva Crome, MD;  Location: [facility] CV [facility];  Service: Cardiovascular;  Laterality: N/A;  . LEFT HEART CATHETERIZATION WITH CORONARY ANGIOGRAM N/A 07/13/2013   Procedure: LEFT HEART CATHETERIZATION WITH CORONARY ANGIOGRAM;  Surgeon: Sinclair Grooms, MD;  Location: [facility] CATH LAB;  Service: Cardiovascular;  Laterality: N/A;  . PERCUTANEOUS CORONARY STENT INTERVENTION (PCI-S)  07/13/2013   Procedure: PERCUTANEOUS CORONARY STENT INTERVENTION (PCI-S);  Surgeon: Sinclair Grooms, MD;  Location: [facility] CATH LAB;  Service: Cardiovascular;;  . TEE WITHOUT CARDIOVERSION N/A 12/12/2018   Procedure: TRANSESOPHAGEAL ECHOCARDIOGRAM (TEE);  Surgeon: Rexene Alberts, MD;  Location: [facility];  Service: Open  Heart Surgery;  Laterality: N/A;    Current Medications: Current Meds  Medication Sig  . acetaminophen (TYLENOL) 500 MG tablet Take 1 tablet (500 mg total) by mouth every 6 (six) hours as needed for mild pain or headache.  . ALPRAZolam (XANAX) 0.25 MG tablet Take 0.25 mg by mouth 2 (two) times daily as needed for anxiety.   [name] atorvastatin (LIPITOR) 80 MG tablet Take 1 tablet (80 mg total) by mouth every evening.  . Cholecalciferol (VITAMIN D3) 50 MCG (2000 UT) capsule Take 2,000 Units by mouth daily.  . clopidogrel (PLAVIX) 75 MG tablet TAKE 1 TABLET BY MOUTH EVERY DAY  . hydrochlorothiazide (MICROZIDE) 12.5 MG  capsule Take 1 capsule (12.5 mg total) by mouth daily.  . Lidocaine (LIDOCAINE PAIN RELIEF) 4 % PTCH Apply 2 patches topically every morning. Apply to lower back over site of pain  . lisinopril (ZESTRIL) 40 MG tablet Take 20 mg by mouth at bedtime.   . metoprolol tartrate (LOPRESSOR) 25 MG tablet Take 1 tablet (25 mg total) by mouth 2 (two) times daily.  . Multiple Vitamins-Minerals (ONE-A-DAY MENS 50+) TABS Take 1 tablet by mouth daily with breakfast.  . pantoprazole (PROTONIX) 40 MG tablet Take 1 tablet (40 mg total) by mouth daily.  . traMADol (ULTRAM) 50 MG tablet Take 1 tablet (50 mg total) by mouth every 6 (six) hours as needed for severe pain.     Allergies:   Aspirin and Quinine derivatives   Social History   Socioeconomic History  . Marital status: Married    Spouse name: [name]  . Number of children: 2  . Years of education: 80  . Highest education level: Not on file  Occupational History  . Occupation: Contractor  Tobacco Use  . Smoking status: Never Smoker  . Smokeless tobacco: Never Used  Substance and Sexual Activity  . Alcohol use: Yes    Comment: occasional beer   . Drug use: No  . Sexual activity: Not Currently  Other Topics Concern  . Not on file  Social History Narrative   Lives with wife   Caffeine use: 1-2 cups coffee per day   Social Determinants of Health   Financial Resource Strain:   . Difficulty of Paying Living Expenses:   Food Insecurity:   . Worried About Charity fundraiser in the Last Year:   . Arboriculturist in the Last Year:   Transportation Needs:   . Film/video editor (Medical):   [name] Lack of Transportation (Non-Medical):   Physical Activity:   . Days of Exercise per Week:   . Minutes of Exercise per Session:   Stress:   . Feeling of Stress :   Social Connections:   . Frequency of Communication with Friends and Family:   . Frequency of Social Gatherings with Friends and Family:   . Attends Religious  Services:   . Active Member of Clubs or Organizations:   . Attends Archivist Meetings:   [name] Marital Status:      Family History: The patient's family history includes Hypertension in his mother. There is no history of Stroke.  ROS:   Please see the history of present illness.    He denies medication side effects.  His current's are that his wife is not gotten the second dose of the COVID-19 vaccine.  All other systems reviewed and are negative.  EKGs/Labs/Other Studies Reviewed:    The following studies were reviewed today: No new  data  EKG:  EKG sinus bradycardia, poor R wave progression, left axis deviation, left anterior hemiblock.  Recent Labs: No results found for requested labs within last 8760 hours.  Recent Lipid Panel    Component Value Date/Time   CHOL 108 03/20/2017 1331   TRIG 87 03/20/2017 1331   HDL 45 03/20/2017 1331   CHOLHDL 2.4 03/20/2017 1331   CHOLHDL 2.7 09/15/2015 0205   VLDL 22 09/15/2015 0205   LDLCALC 46 03/20/2017 1331    Physical Exam:    VS:  BP 132/68   Pulse (!) 53   Ht 5\' 9"  (1.753 m)   Wt 163 lb 6.4 oz (74.1 kg)   SpO2 97%   BMI 24.13 kg/m     Wt Readings from Last 3 Encounters:  01/24/20 163 lb 6.4 oz (74.1 kg)  06/13/19 154 lb (69.9 kg)  02/25/19 155 lb (70.3 kg)     GEN: Healthy appearing younger than stated age.. No acute distress HEENT: Normal NECK: No JVD. LYMPHATICS: No lymphadenopathy CARDIAC:  RRR without murmur, gallop, or edema. VASCULAR:  Normal Pulses. No bruits. RESPIRATORY:  Clear to auscultation without rales, wheezing or rhonchi  ABDOMEN: Soft, non-tender, non-distended, No pulsatile mass, MUSCULOSKELETAL: No deformity  SKIN: Warm and dry NEUROLOGIC:  Alert and oriented x 3 PSYCHIATRIC:  Normal affect   ASSESSMENT:    1. S/P CABG (coronary artery bypass graft)   2. Coronary artery disease of native artery of native heart with stable angina pectoris ([place])   3. Essential hypertension   4.  Dyslipidemia   5. OSA (obstructive sleep apnea)   6. Acute ischemic stroke ([place])   7. Educated about COVID-19 virus infection    PLAN:    In order of problems listed above:  1. Secondary prevention discussed.  Aerobic activity encouraged. 2. Same. 3. Blood pressure is excellent.  Continue current therapy. 4. Continue high intensity statin therapy.  Needs a lipid panel done within the next 2 to 3 months.  He prefers this to be done by Dr. Jefferson Fuel, his primary care physician. 5. Encourage CPAP use 6. No recurrent stroke in the interim 7. COVID-19 vaccine has been received.  Social distancing and mask wearing is encouraged.  Overall education and awareness concerning primary/secondary risk prevention was discussed in detail: LDL less than 70, hemoglobin A1c less than 7, blood pressure target less than 130/80 mmHg, >150 minutes of moderate aerobic activity per week, avoidance of smoking, weight control (via diet and exercise), and continued surveillance/management of/for obstructive sleep apnea. .    Medication Adjustments/Labs and Tests Ordered: Current medicines are reviewed at length with the patient today.  Concerns regarding medicines are outlined above.  Orders Placed This Encounter  Procedures  . EKG 12-Lead   No orders of the defined types were placed in this encounter.   Patient Instructions  Medication Instructions:  Your physician recommends that you continue on your current medications as directed. Please refer to the Current Medication list given to you today.  *If you need a refill on your cardiac medications before your next appointment, please call your pharmacy*   Lab Work: None If you have labs (blood work) drawn today and your tests are completely normal, you will receive your results only by: [name] MyChart Message (if you have MyChart) OR . A paper copy in the mail If you have any lab test that is abnormal or we need to change your treatment, we will call you to  review the results.  Testing/Procedures: None   Follow-Up: At [facility], you and your health needs are our priority.  As part of our continuing mission to provide you with exceptional heart care, we have created designated Provider Care Teams.  These Care Teams include your primary Cardiologist (physician) and Advanced Practice Providers (APPs -  Physician Assistants and Nurse Practitioners) who all work together to provide you with the care you need, when you need it.  We recommend signing up for the patient portal called "MyChart".  Sign up information is provided on this After Visit Summary.  MyChart is used to connect with patients for Virtual Visits (Telemedicine).  Patients are able to view lab/test results, encounter notes, upcoming appointments, etc.  Non-urgent messages can be sent to your provider as well.   To learn more about what you can do with MyChart, go to [email or web address].    Your next appointment:   12 month(s)  The format for your next appointment:   In Person  Provider:   You may see Sinclair Grooms, MD or one of the following Advanced Practice Providers on your designated Care Team:    Truitt Merle, NP  Cecilie Kicks, NP  Kathyrn Drown, NP    Other Instructions      Signed, Sinclair Grooms, MD  01/24/2020 2:21 PM    [place]

## 2020-01-24 ENCOUNTER — Encounter: Payer: Self-pay | Admitting: Interventional Cardiology

## 2020-01-24 ENCOUNTER — Other Ambulatory Visit: Payer: Self-pay

## 2020-01-24 ENCOUNTER — Ambulatory Visit: Payer: Medicare PPO | Admitting: Interventional Cardiology

## 2020-01-24 VITALS — BP 132/68 | HR 53 | Ht 69.0 in | Wt 163.4 lb

## 2020-01-24 DIAGNOSIS — E785 Hyperlipidemia, unspecified: Secondary | ICD-10-CM

## 2020-01-24 DIAGNOSIS — G4733 Obstructive sleep apnea (adult) (pediatric): Secondary | ICD-10-CM

## 2020-01-24 DIAGNOSIS — I25118 Atherosclerotic heart disease of native coronary artery with other forms of angina pectoris: Secondary | ICD-10-CM | POA: Diagnosis not present

## 2020-01-24 DIAGNOSIS — I10 Essential (primary) hypertension: Secondary | ICD-10-CM

## 2020-01-24 DIAGNOSIS — Z951 Presence of aortocoronary bypass graft: Secondary | ICD-10-CM

## 2020-01-24 DIAGNOSIS — I639 Cerebral infarction, unspecified: Secondary | ICD-10-CM

## 2020-01-24 DIAGNOSIS — Z7189 Other specified counseling: Secondary | ICD-10-CM | POA: Diagnosis not present

## 2020-01-24 NOTE — Patient Instructions (Signed)

## 2020-02-05 ENCOUNTER — Telehealth: Payer: Self-pay | Admitting: Interventional Cardiology

## 2020-02-05 DIAGNOSIS — R413 Other amnesia: Secondary | ICD-10-CM | POA: Diagnosis not present

## 2020-02-05 DIAGNOSIS — R350 Frequency of micturition: Secondary | ICD-10-CM | POA: Diagnosis not present

## 2020-02-05 DIAGNOSIS — H6122 Impacted cerumen, left ear: Secondary | ICD-10-CM | POA: Diagnosis not present

## 2020-02-05 DIAGNOSIS — D509 Iron deficiency anemia, unspecified: Secondary | ICD-10-CM | POA: Diagnosis not present

## 2020-02-05 DIAGNOSIS — H9192 Unspecified hearing loss, left ear: Secondary | ICD-10-CM | POA: Diagnosis not present

## 2020-02-05 DIAGNOSIS — E538 Deficiency of other specified B group vitamins: Secondary | ICD-10-CM | POA: Diagnosis not present

## 2020-02-05 DIAGNOSIS — R5382 Chronic fatigue, unspecified: Secondary | ICD-10-CM | POA: Diagnosis not present

## 2020-02-05 NOTE — Telephone Encounter (Signed)
Spoke with pt and he states that he and his wife have a difference of opinion on if something is going on with him.  Pt states that he gets up every morning, eats breakfast and then takes the dog out for a walk for about an hour.  After returning he may lay down and take a short nap.  Denies CP, SOB, dizziness or other cardiac issues.  States BP is always good and HR is 60s-70s.  Wife then picked up the phone and was upset.  States pt lays around in the bed all day, pt denies.  Wife started speaking loudly about him not helping with house work and talking about his work history.  Pt did break his back and has been dealing with pain.  Doesn't want to take pain meds because they cause constipation.  Explain to the wife, ultimately it is his choice on how he wants to treat his back pain.  Advised wife and pt to reach out to PCP about fatigue as pt may need lab work to make sure there isn't something else going on.  Wife verbalized understanding.

## 2020-02-05 NOTE — Telephone Encounter (Signed)
Patsy, wife of patient called. She said the patient is constantly tired and spends most of his time in bed. She wanted to know if his heart was damaged at all with his heart attacks. The wife wanted to ask Dr. Tamala Julian at the patient's last appointment but due to [place] she was not able to come with him.  Please follow up with [facility]

## 2020-02-19 ENCOUNTER — Other Ambulatory Visit: Payer: Self-pay | Admitting: Nurse Practitioner

## 2020-03-06 DIAGNOSIS — F331 Major depressive disorder, recurrent, moderate: Secondary | ICD-10-CM | POA: Diagnosis not present

## 2020-03-06 DIAGNOSIS — Z139 Encounter for screening, unspecified: Secondary | ICD-10-CM | POA: Diagnosis not present

## 2020-04-03 DIAGNOSIS — F331 Major depressive disorder, recurrent, moderate: Secondary | ICD-10-CM | POA: Diagnosis not present

## 2020-04-08 ENCOUNTER — Other Ambulatory Visit: Payer: Self-pay | Admitting: Nurse Practitioner

## 2020-04-30 DIAGNOSIS — I10 Essential (primary) hypertension: Secondary | ICD-10-CM | POA: Diagnosis not present

## 2020-04-30 DIAGNOSIS — F331 Major depressive disorder, recurrent, moderate: Secondary | ICD-10-CM | POA: Diagnosis not present

## 2020-04-30 DIAGNOSIS — Z79899 Other long term (current) drug therapy: Secondary | ICD-10-CM | POA: Diagnosis not present

## 2020-04-30 DIAGNOSIS — M109 Gout, unspecified: Secondary | ICD-10-CM | POA: Diagnosis not present

## 2020-04-30 DIAGNOSIS — E785 Hyperlipidemia, unspecified: Secondary | ICD-10-CM | POA: Diagnosis not present

## 2020-04-30 DIAGNOSIS — R7301 Impaired fasting glucose: Secondary | ICD-10-CM | POA: Diagnosis not present

## 2020-05-01 DIAGNOSIS — E785 Hyperlipidemia, unspecified: Secondary | ICD-10-CM | POA: Diagnosis not present

## 2020-05-01 DIAGNOSIS — M109 Gout, unspecified: Secondary | ICD-10-CM | POA: Diagnosis not present

## 2020-05-01 DIAGNOSIS — R7301 Impaired fasting glucose: Secondary | ICD-10-CM | POA: Diagnosis not present

## 2020-05-05 DIAGNOSIS — Z1331 Encounter for screening for depression: Secondary | ICD-10-CM | POA: Diagnosis not present

## 2020-05-05 DIAGNOSIS — Z9181 History of falling: Secondary | ICD-10-CM | POA: Diagnosis not present

## 2020-05-05 DIAGNOSIS — Z0000 Encounter for general adult medical examination without abnormal findings: Secondary | ICD-10-CM | POA: Diagnosis not present

## 2020-05-05 DIAGNOSIS — E785 Hyperlipidemia, unspecified: Secondary | ICD-10-CM | POA: Diagnosis not present

## 2020-05-13 DIAGNOSIS — I714 Abdominal aortic aneurysm, without rupture: Secondary | ICD-10-CM | POA: Diagnosis not present

## 2020-05-13 DIAGNOSIS — I708 Atherosclerosis of other arteries: Secondary | ICD-10-CM | POA: Diagnosis not present

## 2020-05-13 DIAGNOSIS — M25552 Pain in left hip: Secondary | ICD-10-CM | POA: Diagnosis not present

## 2020-05-13 DIAGNOSIS — M47816 Spondylosis without myelopathy or radiculopathy, lumbar region: Secondary | ICD-10-CM | POA: Diagnosis not present

## 2020-05-13 DIAGNOSIS — I700 Atherosclerosis of aorta: Secondary | ICD-10-CM | POA: Diagnosis not present

## 2020-05-13 DIAGNOSIS — M25562 Pain in left knee: Secondary | ICD-10-CM | POA: Diagnosis not present

## 2020-05-13 DIAGNOSIS — M545 Low back pain: Secondary | ICD-10-CM | POA: Diagnosis not present

## 2020-05-15 ENCOUNTER — Telehealth: Payer: Self-pay | Admitting: Interventional Cardiology

## 2020-05-15 ENCOUNTER — Other Ambulatory Visit: Payer: Self-pay

## 2020-05-15 ENCOUNTER — Encounter (HOSPITAL_COMMUNITY): Payer: Self-pay

## 2020-05-15 ENCOUNTER — Emergency Department (HOSPITAL_BASED_OUTPATIENT_CLINIC_OR_DEPARTMENT_OTHER)
Admit: 2020-05-15 | Discharge: 2020-05-15 | Disposition: A | Discharge status: Home / Self Care | Payer: Medicare PPO | Attending: Emergency Medicine | Admitting: Emergency Medicine

## 2020-05-15 ENCOUNTER — Emergency Department (HOSPITAL_COMMUNITY)
Admission: EM | Admit: 2020-05-15 | Discharge: 2020-05-15 | Disposition: A | Discharge status: Home / Self Care | Payer: Medicare PPO | Attending: Emergency Medicine | Admitting: Emergency Medicine

## 2020-05-15 ENCOUNTER — Encounter (HOSPITAL_COMMUNITY): Payer: Medicare PPO

## 2020-05-15 DIAGNOSIS — I2510 Atherosclerotic heart disease of native coronary artery without angina pectoris: Secondary | ICD-10-CM | POA: Diagnosis not present

## 2020-05-15 DIAGNOSIS — M791 Myalgia, unspecified site: Secondary | ICD-10-CM | POA: Insufficient documentation

## 2020-05-15 DIAGNOSIS — Z951 Presence of aortocoronary bypass graft: Secondary | ICD-10-CM | POA: Diagnosis not present

## 2020-05-15 DIAGNOSIS — R252 Cramp and spasm: Secondary | ICD-10-CM

## 2020-05-15 DIAGNOSIS — I10 Essential (primary) hypertension: Secondary | ICD-10-CM | POA: Diagnosis not present

## 2020-05-15 DIAGNOSIS — Z79899 Other long term (current) drug therapy: Secondary | ICD-10-CM | POA: Insufficient documentation

## 2020-05-15 DIAGNOSIS — M79606 Pain in leg, unspecified: Secondary | ICD-10-CM | POA: Diagnosis not present

## 2020-05-15 DIAGNOSIS — R531 Weakness: Secondary | ICD-10-CM | POA: Diagnosis not present

## 2020-05-15 DIAGNOSIS — R202 Paresthesia of skin: Secondary | ICD-10-CM | POA: Diagnosis not present

## 2020-05-15 DIAGNOSIS — M2550 Pain in unspecified joint: Secondary | ICD-10-CM | POA: Diagnosis not present

## 2020-05-15 DIAGNOSIS — R52 Pain, unspecified: Secondary | ICD-10-CM

## 2020-05-15 LAB — BASIC METABOLIC PANEL
Anion gap: 7 (ref 5–15)
BUN: 24 mg/dL — ABNORMAL HIGH (ref 8–23)
CO2: 24 mmol/L (ref 22–32)
Calcium: 8.8 mg/dL — ABNORMAL LOW (ref 8.9–10.3)
Chloride: 107 mmol/L (ref 98–111)
Creatinine, Ser: 0.95 mg/dL (ref 0.61–1.24)
GFR calc Af Amer: >60 mL/min (ref >60)
GFR calc non Af Amer: >60 mL/min (ref >60)
Glucose, Bld: 98 mg/dL (ref 70–99)
Potassium: 4.9 mmol/L (ref 3.5–5.1)
Sodium: 138 mmol/L (ref 135–145)

## 2020-05-15 LAB — CBC
HCT: 35.3 % — ABNORMAL LOW (ref 39.0–52.0)
Hemoglobin: 11.5 g/dL — ABNORMAL LOW (ref 13.0–17.0)
MCH: 33.7 pg (ref 26.0–34.0)
MCHC: 32.6 g/dL (ref 30.0–36.0)
MCV: 103.5 fL — ABNORMAL HIGH (ref 80.0–100.0)
Platelets: 149 10*3/uL — ABNORMAL LOW (ref 150–400)
RBC: 3.41 MIL/uL — ABNORMAL LOW (ref 4.22–5.81)
RDW: 13.1 % (ref 11.5–15.5)
WBC: 5.1 10*3/uL (ref 4.0–10.5)
nRBC: 0 % (ref 0.0–0.2)

## 2020-05-15 NOTE — ED Triage Notes (Addendum)
Pt arrives to ED w/ c/o LLE weakness and pain x several months that has worsened over the last couple of days. Pt reports 10/10 pain while walking. Pt reports pain from hip to knee. + pedal pulses noted in triage.

## 2020-05-15 NOTE — Progress Notes (Signed)
Left lower extremity venous duplex has been completed. Preliminary results can be found in CV Proc through chart review.  Results were given to Dr. Wilson Singer.  05/15/20 3:09 PM Carlos Levering RVT

## 2020-05-15 NOTE — Telephone Encounter (Signed)
New message  Patient is calling in with pain in his left leg and his foot are numb, states that he has discoloration in his leg. Also states he has tingling in his leg when he is laying down. Please give patient a call back.

## 2020-05-15 NOTE — Telephone Encounter (Signed)
Agree 

## 2020-05-15 NOTE — Telephone Encounter (Signed)
Spoke with pt's wife and for several weeks has noted left leg pain and numbness Per pt feels it is worse today notes pain with walking and the pain is above the knee no swelling noted Encouraged to go to ED for eval and tx Pt's wife agrees .Will forward to Dr Tamala Julian for review .Adonis Housekeeper

## 2020-05-15 NOTE — ED Provider Notes (Signed)
[facility] EMERGENCY DEPARTMENT Provider Note   CSN: [id number] Arrival date & time: 05/15/20  1113     History Chief Complaint  Patient presents with  . Extremity Weakness    Matthew Liu is a 79 y.o. male.   Extremity Weakness This is a recurrent problem. The current episode started more than 1 week ago. The problem occurs daily. The problem has been gradually worsening. Pertinent negatives include no chest pain, no headaches and no shortness of breath. Nothing aggravates the symptoms. Nothing relieves the symptoms. He has tried nothing for the symptoms. The treatment provided no relief.       Past Medical History:  Diagnosis Date  . 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 Ankle fracture, left 2009   rod placed  . Anxiety   . Aspirin allergy    a. Anaphylactic rxn.  . Barrett's esophagus   . CAD (coronary artery disease)    a. Inf STEMI 07/2013: s/p DES x 2 from ostial to [facility] 07/13/13. Residual Cx disease for med rx (PCI if recurrent pain).  . Dyslipidemia   . ED (erectile dysfunction)   . GERD (gastroesophageal reflux disease)   . History of nuclear stress test    Nuclear stress test 5/19: EF 53, no ischemia, no infarction; Low Risk  . Hydrocele of testis   . Hyperglycemia    a. A1C 5.8 in 07/2013.  [name] Hypertension   . Myocardial infarction ([place])   . 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 Coronary artery disease involving native coronary artery of native heart with unstable angina pectoris ([place]) 12/11/2018  . Unstable angina ([place])   . Lumbar compression fracture ([place]) 01/09/2018  . Lacunar stroke ([place]) 10/13/2015  . Acute ischemic stroke ([place]) 09/15/2015  . Ankle fracture, left   . OSA (obstructive sleep apnea)   . Erectile dysfunction 12/01/2014  . Aspirin allergy 07/16/2013    . Dyslipidemia 07/16/2013  . 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 HTN (hypertension) 07/13/2013  . GERD (gastroesophageal reflux disease) 07/13/2013  . Barrett's esophagus 07/13/2013    Past Surgical History:  Procedure Laterality Date  . cataract surgery      bilateral   . CORONARY ANGIOPLASTY     stents   . CORONARY ARTERY BYPASS GRAFT N/A 12/12/2018   Procedure: CORONARY ARTERY BYPASS GRAFTING (CABG);  Surgeon: Rexene Alberts, MD;  Location: [facility];  Service: Open Heart Surgery;  Laterality: N/A;  . HYDROCELE EXCISION Left 07/17/2014   Procedure: LEFT HYDROCELECTOMY;  Surgeon: Malka So, MD;  Location: [facility];  Service: Urology;  Laterality: Left;  . left ankle surgery  1999  . LEFT HEART CATH AND CORONARY ANGIOGRAPHY N/A 12/11/2018   Procedure: LEFT HEART CATH AND CORONARY ANGIOGRAPHY;  Surgeon: Belva Crome, MD;  Location: [facility] [facility];  Service: Cardiovascular;  Laterality: N/A;  . LEFT HEART CATHETERIZATION WITH CORONARY ANGIOGRAM N/A 07/13/2013   Procedure: LEFT HEART CATHETERIZATION WITH CORONARY ANGIOGRAM;  Surgeon: Sinclair Grooms, MD;  Location: [facility] CATH LAB;  Service: Cardiovascular;  Laterality: N/A;  . PERCUTANEOUS CORONARY STENT INTERVENTION (PCI-S)  07/13/2013   Procedure: PERCUTANEOUS CORONARY STENT INTERVENTION (PCI-S);  Surgeon: Sinclair Grooms, MD;  Location: [facility] CATH LAB;  Service: Cardiovascular;;  . TEE WITHOUT CARDIOVERSION N/A 12/12/2018   Procedure: TRANSESOPHAGEAL ECHOCARDIOGRAM (TEE);  Surgeon: Rexene Alberts, MD;  Location: [facility];  Service: Open Heart Surgery;  Laterality: N/A;       Family History  Problem Relation Age of Onset  . Hypertension Mother   . Stroke Neg Hx     Social History   Tobacco Use  . Smoking status: Never Smoker  . Smokeless tobacco: Never Used  Vaping Use  . Vaping Use: Never used  Substance Use Topics  . Alcohol use: Yes    Comment: occasional beer   . Drug use: No    Home  Medications Prior to Admission medications   Medication Sig Start Date End Date Taking? Authorizing Provider  acetaminophen (TYLENOL) 500 MG tablet Take 1 tablet (500 mg total) by mouth every 6 (six) hours as needed for mild pain or headache. 01/12/18   Norm Parcel, PA-C  ALPRAZolam Duanne Moron) 0.25 MG tablet Take 0.25 mg by mouth 2 (two) times daily as needed for anxiety.  06/29/13   [provider]  atorvastatin (LIPITOR) 80 MG tablet Take 1 tablet (80 mg total) by mouth every evening. 02/08/19   Burtis Junes, NP  Cholecalciferol (VITAMIN D3) 50 MCG (2000 UT) capsule Take 2,000 Units by mouth daily.    [provider]  clopidogrel (PLAVIX) 75 MG tablet TAKE 1 TABLET BY MOUTH EVERY DAY 12/30/19   Belva Crome, MD  hydrochlorothiazide (MICROZIDE) 12.5 MG capsule TAKE 1 CAPSULE BY MOUTH EVERY DAY 04/08/20   Burtis Junes, NP  Lidocaine (LIDOCAINE PAIN RELIEF) 4 % PTCH Apply 2 patches topically every morning. Apply to lower back over site of pain 05/23/19   Tillie Fantasia, MD  lisinopril (ZESTRIL) 40 MG tablet Take 20 mg by mouth at bedtime.  02/16/19   [provider]  metoprolol tartrate (LOPRESSOR) 25 MG tablet TAKE 1 TABLET BY MOUTH TWICE A DAY 02/19/20   Burtis Junes, NP  Multiple Vitamins-Minerals (ONE-A-DAY MENS 50+) TABS Take 1 tablet by mouth daily with breakfast.    [provider]  pantoprazole (PROTONIX) 40 MG tablet TAKE 1 TABLET BY MOUTH EVERY DAY 04/08/20   Burtis Junes, NP  traMADol (ULTRAM) 50 MG tablet Take 1 tablet (50 mg total) by mouth every 6 (six) hours as needed for severe pain. 01/16/19   Rexene Alberts, MD    Allergies    Aspirin and Quinine derivatives  Review of Systems   Review of Systems  Constitutional: Negative for chills and fever.  HENT: Negative for congestion and rhinorrhea.   Respiratory: Negative for cough and shortness of breath.   Cardiovascular: Negative for chest pain and palpitations.  Gastrointestinal:  Negative for diarrhea, nausea and vomiting.  Genitourinary: Negative for difficulty urinating and dysuria.  Musculoskeletal: Positive for arthralgias, extremity weakness and myalgias. Negative for back pain.  Skin: Negative for color change and rash.  Neurological: Positive for weakness and numbness. Negative for light-headedness and headaches.    Physical Exam Updated Vital Signs BP (!) 128/59 (BP Location: Right Arm)   Pulse (!) 56   Temp 98.6 F (37 C) (Oral)   Resp 18   Ht 5\' 8"  (1.727 m)   Wt 74.8 kg   SpO2 100%   BMI 25.09 kg/m   Physical Exam Vitals and nursing note reviewed. Exam conducted with a chaperone present.  Constitutional:      General: He is not  in acute distress.    Appearance: Normal appearance.  HENT:     Head: Normocephalic and atraumatic.     Nose: No rhinorrhea.  Eyes:     General:        Right eye: No discharge.        Left eye: No discharge.     Conjunctiva/sclera: Conjunctivae normal.  Cardiovascular:     Rate and Rhythm: Normal rate and regular rhythm.     Comments: 2+ peripheral pulses in the lower extremities Pulmonary:     Effort: Pulmonary effort is normal.     Breath sounds: No stridor.  Abdominal:     General: Abdomen is flat. There is no distension.     Palpations: Abdomen is soft.  Musculoskeletal:        General: Tenderness (left gastro ttp, no mass, no color change) present. No deformity or signs of injury.  Skin:    General: Skin is warm and dry.  Neurological:     General: No focal deficit present.     Mental Status: He is alert. Mental status is at baseline.     Motor: No weakness.     Comments: 5 out of 5 motor strength in bilateral extremity sensation intact throughout.  Psychiatric:        Mood and Affect: Mood normal.        Behavior: Behavior normal.        Thought Content: Thought content normal.     ED Results / Procedures / Treatments   Labs (all labs ordered are listed, but only abnormal results are  displayed) Labs Reviewed  CBC  BASIC METABOLIC PANEL    EKG None  Radiology No results found.  Procedures Procedures (including critical care time)  Medications Ordered in ED Medications - No data to display  ED Course  I have reviewed the triage vital signs and the nursing notes.  Pertinent labs & imaging results that were available during my care of the patient were reviewed by me and considered in my medical decision making (see chart for details).    MDM Rules/Calculators/A&P                          Patient has chronic back pain, has had waxing waning leg pain.  He seems to indicate that it is positional, laying down flat in certain positions make the pain worse repositioning makes it better.  He says walking does seem to affect however there is nursing notes to comment on him saying walking affected.  Claudication is a possibility however he has strong peripheral pulses, so I do not feel that there is arterial embolus at this time.  Has also been waxing and waning for months.  No signs of infection, no signs of trauma.  He has no midline back tenderness he has normal neurologic exam.  Evaluate for blood clot in the leg as he has unilateral leg pain and tenderness in the deep vein tract.  Ultrasound be done.  Baseline labs will be done to make sure there is no electrolyte abnormality or signs of anemia.  Pt care was handed off to on coming provider at 1500.  Complete history and physical and current plan have been communicated.  Please refer to their note for the remainder of ED care and ultimate disposition.    Final Clinical Impression(s) / ED Diagnoses Final diagnoses:  None    Rx / DC Orders ED Discharge Orders  None       Breck Coons, MD 05/15/20 1515

## 2020-05-15 NOTE — Telephone Encounter (Signed)
Busy signal ../cy

## 2020-05-15 NOTE — ED Provider Notes (Signed)
Assumed care from outgoing physician.  In summary, this is 79 year old male with weeks-to-months history of waxing/waning leg pain. Does not appear to be a vascular issue.  Good peripheral pulses.  Ultrasound negative for DVT.  Neuro examnonfocal.  No significant metabolic/electrolyte derangement noted on labs. Doesn't appear to be urgent/emergent process. PCP FU.   VAS [place] LOWER EXTREMITY VENOUS (DVT) (ONLY [facility] & [facility])  Result Date: 05/15/2020  Lower Venous DVTStudy Indications: Pain.  Risk Factors: None identified. Comparison Study: No prior studies. Performing Technologist: Oliver Hum RVT  Examination Guidelines: A complete evaluation includes B-mode imaging, spectral Doppler, color Doppler, and power Doppler as needed of all accessible portions of each vessel. Bilateral testing is considered an integral part of a complete examination. Limited examinations for reoccurring indications may be performed as noted. The reflux portion of the exam is performed with the patient in reverse Trendelenburg.  +-----+---------------+---------+-----------+----------+--------------+ RIGHTCompressibilityPhasicitySpontaneityPropertiesThrombus Aging +-----+---------------+---------+-----------+----------+--------------+ CFV  Full           Yes      Yes                                 +-----+---------------+---------+-----------+----------+--------------+   +---------+---------------+---------+-----------+----------+--------------+ LEFT     CompressibilityPhasicitySpontaneityPropertiesThrombus Aging +---------+---------------+---------+-----------+----------+--------------+ CFV      Full           Yes      Yes                                 +---------+---------------+---------+-----------+----------+--------------+ SFJ      Full                                                        +---------+---------------+---------+-----------+----------+--------------+ FV Prox  Full                                                         +---------+---------------+---------+-----------+----------+--------------+ FV Mid   Full                                                        +---------+---------------+---------+-----------+----------+--------------+ FV DistalFull                                                        +---------+---------------+---------+-----------+----------+--------------+ PFV      Full                                                        +---------+---------------+---------+-----------+----------+--------------+ POP      Full  Yes      Yes                                 +---------+---------------+---------+-----------+----------+--------------+ PTV      Full                                                        +---------+---------------+---------+-----------+----------+--------------+ PERO     Full                                                        +---------+---------------+---------+-----------+----------+--------------+     Summary: RIGHT: - No evidence of common femoral vein obstruction.  LEFT: - There is no evidence of deep vein thrombosis in the lower extremity.  - No cystic structure found in the popliteal fossa.  *See table(s) above for measurements and observations.    Preliminary      Virgel Manifold, MD 05/15/20 [phone]

## 2020-05-18 NOTE — Telephone Encounter (Signed)
Patient's wife is calling to follow up in regards to leg pain and numbness. She states the pain is gradually becoming worse and she would like to update Dr. Tamala Julian. Please return call to discuss.

## 2020-05-18 NOTE — Telephone Encounter (Signed)
Pt went to ED on Friday due to increased pain.  DVT US was done and came back negative.  Wife calling today with update of this information.  Advised wife since DVT was ruled out, reach out to PCP or Ortho doctor.  Wife states pt has appt with ortho on Thursday.  Wife appreciative for call.

## 2020-05-21 ENCOUNTER — Telehealth: Payer: Self-pay | Admitting: Interventional Cardiology

## 2020-05-21 NOTE — Telephone Encounter (Signed)
Spoke with wife and advised her it would be wise to continue with the plan for angiogram.  Wife verbalized understanding and was appreciative for call.

## 2020-05-21 NOTE — Telephone Encounter (Signed)
Patient's wife called she would like to speak to the nurse, she states that the PCP is going to send him for an angiogram.  She is wondering if he should have a MRI to show his head to see if he had a stroke.

## 2020-06-01 DIAGNOSIS — I701 Atherosclerosis of renal artery: Secondary | ICD-10-CM | POA: Diagnosis not present

## 2020-06-01 DIAGNOSIS — I671 Cerebral aneurysm, nonruptured: Secondary | ICD-10-CM | POA: Diagnosis not present

## 2020-06-01 DIAGNOSIS — I700 Atherosclerosis of aorta: Secondary | ICD-10-CM | POA: Diagnosis not present

## 2020-06-01 DIAGNOSIS — I739 Peripheral vascular disease, unspecified: Secondary | ICD-10-CM | POA: Diagnosis not present

## 2020-06-01 DIAGNOSIS — Z7902 Long term (current) use of antithrombotics/antiplatelets: Secondary | ICD-10-CM | POA: Diagnosis not present

## 2020-06-02 DIAGNOSIS — F419 Anxiety disorder, unspecified: Secondary | ICD-10-CM | POA: Diagnosis not present

## 2020-06-02 DIAGNOSIS — I739 Peripheral vascular disease, unspecified: Secondary | ICD-10-CM | POA: Diagnosis not present

## 2020-06-08 DIAGNOSIS — I739 Peripheral vascular disease, unspecified: Secondary | ICD-10-CM | POA: Diagnosis not present

## 2020-06-08 DIAGNOSIS — Z8679 Personal history of other diseases of the circulatory system: Secondary | ICD-10-CM | POA: Diagnosis not present

## 2020-06-17 DIAGNOSIS — Z961 Presence of intraocular lens: Secondary | ICD-10-CM | POA: Diagnosis not present

## 2020-06-17 DIAGNOSIS — H43813 Vitreous degeneration, bilateral: Secondary | ICD-10-CM | POA: Diagnosis not present

## 2020-06-17 DIAGNOSIS — H02831 Dermatochalasis of right upper eyelid: Secondary | ICD-10-CM | POA: Diagnosis not present

## 2020-06-17 DIAGNOSIS — H04123 Dry eye syndrome of bilateral lacrimal glands: Secondary | ICD-10-CM | POA: Diagnosis not present

## 2020-06-17 DIAGNOSIS — H18513 Endothelial corneal dystrophy, bilateral: Secondary | ICD-10-CM | POA: Diagnosis not present

## 2020-06-17 DIAGNOSIS — H02834 Dermatochalasis of left upper eyelid: Secondary | ICD-10-CM | POA: Diagnosis not present

## 2020-06-20 ENCOUNTER — Other Ambulatory Visit: Payer: Self-pay | Admitting: Interventional Cardiology

## 2020-06-24 ENCOUNTER — Telehealth: Payer: Self-pay | Admitting: Interventional Cardiology

## 2020-06-24 DIAGNOSIS — F331 Major depressive disorder, recurrent, moderate: Secondary | ICD-10-CM | POA: Diagnosis not present

## 2020-06-24 DIAGNOSIS — I10 Essential (primary) hypertension: Secondary | ICD-10-CM | POA: Diagnosis not present

## 2020-06-24 DIAGNOSIS — R531 Weakness: Secondary | ICD-10-CM | POA: Diagnosis not present

## 2020-06-24 NOTE — Telephone Encounter (Signed)
Spoke with pt and scheduled him to come see Dr. Tamala Julian on 9/24.  Pt states he had some tests done recently at [facility].  Message sent to Chart Prep and Medical Records to obtain these.

## 2020-06-24 NOTE — Telephone Encounter (Signed)
Medical records requested from [facility]. 06/24/20 vlm

## 2020-06-24 NOTE — Telephone Encounter (Signed)
Pt wife called stated that pt would like to see if he could be worked in, per PCP pt needs to see Dr Tamala Julian due do sleeping a lot and pt is having issues with PAD . Pt was seen at [facility] and [place].   No Available appts with DR Tamala Julian , prefer to only see Dr Tamala Julian.    Best contact number [phone] Cell [phone]- after 10 in the morning

## 2020-06-25 ENCOUNTER — Telehealth: Payer: Self-pay | Admitting: Interventional Cardiology

## 2020-06-25 NOTE — Telephone Encounter (Signed)
Medical records received from [facility]. 06/25/20 vlm

## 2020-07-03 ENCOUNTER — Ambulatory Visit: Payer: Medicare PPO | Admitting: Interventional Cardiology

## 2020-07-13 ENCOUNTER — Telehealth: Payer: Self-pay | Admitting: Interventional Cardiology

## 2020-07-13 NOTE — Telephone Encounter (Signed)
Attempted to call wife back.  Phone line busy.  Note in computer states records received.  Spoke with medical records and they are trying to locate, otherwise will request again.

## 2020-07-13 NOTE — Telephone Encounter (Signed)
Wife of patient wanted to know if Dr. Tamala Julian was able to get the test results from [facility] yet. Please advise

## 2020-07-14 NOTE — Telephone Encounter (Signed)
CT and LEA scanned into media tab.  Will send to Dr. Tamala Julian to review.

## 2020-07-15 ENCOUNTER — Encounter: Payer: Self-pay | Admitting: Interventional Cardiology

## 2020-07-15 NOTE — Telephone Encounter (Signed)
Let him know the studies were reviewed.  I have no recommendations.

## 2020-07-15 NOTE — Telephone Encounter (Signed)
Spoke with wife and made her aware of what Dr. Tamala Julian said. Wife verbalized understanding.

## 2020-09-01 ENCOUNTER — Telehealth: Payer: Self-pay | Admitting: Interventional Cardiology

## 2020-09-01 NOTE — Telephone Encounter (Signed)
Wife of the patient called. She wanted to have Dr. Tamala Julian see the patient soon. The patient has been sleeping a lot more and just complains of being tired all the time.  The wife is not sure what is going on. The patient does not complain about much so it is hard to tell what is wrong.

## 2020-09-01 NOTE — Telephone Encounter (Signed)
Spoke with wife and she states pt is still having the same problems he was having from when she called previously.  States pt will get up in the mornings sometimes and go for a walk and walks no more than a mile and comes right back home and says he's going to lay down and will sleep awhile.  States he is much more fatigued that he use to be.  Pt gets SOB with exertion at times.  Denies CP or swelling.  Pt was seen at [facility] in August with complaints of fatigue and leg weakness.  Had CTs at [facility] and LEAs at [place] which are scanned in Epic under the media tab.  Scheduled pt to come in to see Robbie Lis, PA-C since Dr. Tamala Julian is in the office tomorrow.  Wife appreciative for call.

## 2020-09-02 ENCOUNTER — Ambulatory Visit: Payer: Medicare PPO | Admitting: Physician Assistant

## 2020-09-02 ENCOUNTER — Other Ambulatory Visit: Payer: Self-pay

## 2020-09-02 ENCOUNTER — Encounter: Payer: Self-pay | Admitting: Physician Assistant

## 2020-09-02 VITALS — BP 140/70 | HR 52 | Ht 68.0 in | Wt 167.4 lb

## 2020-09-02 DIAGNOSIS — I10 Essential (primary) hypertension: Secondary | ICD-10-CM | POA: Diagnosis not present

## 2020-09-02 DIAGNOSIS — I25118 Atherosclerotic heart disease of native coronary artery with other forms of angina pectoris: Secondary | ICD-10-CM | POA: Diagnosis not present

## 2020-09-02 DIAGNOSIS — E785 Hyperlipidemia, unspecified: Secondary | ICD-10-CM | POA: Diagnosis not present

## 2020-09-02 DIAGNOSIS — R5383 Other fatigue: Secondary | ICD-10-CM

## 2020-09-02 LAB — BASIC METABOLIC PANEL
BUN/Creatinine Ratio: 20 (ref 10–24)
BUN: 20 mg/dL (ref 8–27)
CO2: 25 mmol/L (ref 20–29)
Calcium: 8.9 mg/dL (ref 8.6–10.2)
Chloride: 102 mmol/L (ref 96–106)
Creatinine, Ser: 1.01 mg/dL (ref 0.76–1.27)
GFR calc Af Amer: 81 mL/min/{1.73_m2} (ref >59)
GFR calc non Af Amer: 70 mL/min/{1.73_m2} (ref >59)
Glucose: 105 mg/dL — ABNORMAL HIGH (ref 65–99)
Potassium: 4.3 mmol/L (ref 3.5–5.2)
Sodium: 138 mmol/L (ref 134–144)

## 2020-09-02 LAB — CBC
Hematocrit: 35.2 % — ABNORMAL LOW (ref 37.5–51.0)
Hemoglobin: 12.4 g/dL — ABNORMAL LOW (ref 13.0–17.7)
MCH: 35.5 pg — ABNORMAL HIGH (ref 26.6–33.0)
MCHC: 35.2 g/dL (ref 31.5–35.7)
MCV: 101 fL — ABNORMAL HIGH (ref 79–97)
Platelets: 157 10*3/uL (ref 150–450)
RBC: 3.49 x10E6/uL — ABNORMAL LOW (ref 4.14–5.80)
RDW: 12.8 % (ref 11.6–15.4)
WBC: 5.2 10*3/uL (ref 3.4–10.8)

## 2020-09-02 LAB — TSH: TSH: 1.7 u[IU]/mL (ref 0.450–4.500)

## 2020-09-02 MED ORDER — METOPROLOL TARTRATE 25 MG PO TABS: 12.5000 mg | ORAL_TABLET | Freq: Two times a day (BID) | ORAL | 3 refills | Status: DC

## 2020-09-02 NOTE — Progress Notes (Signed)
Cardiology Office Note:    Date:  09/02/2020   ID:  Matthew Liu, DOB 04/02/1941, MRN [id number]  PCP:  Nicoletta Dress, MD  [facility] Cardiologist:  Sinclair Grooms, MD  [facility] Electrophysiologist:  None   Chief Complaint: DOE  History of Present Illness:    Matthew Liu is a 79 y.o. male with a hx of CAD s/p prior stenting and CABG in 2020, HTN and HLD seen for dyspnea and fatigue.   Hx of inferior MI in 2014 s/p right coronary ostial and mid vessel stenting with residual complex mid and distal circumflex disease. Unstable angina 12/2018 led to CABG with 5 vessel coronary bypass including LIMA to LAD, saphenous vein graft to distal RCA, saphenous vein graft to OM, saphenous vein graft to OM 3, and saphenous vein graft to diagonal.   Patient for evaluation with wife.  Patient is very soft-spoken.  Frequently looks at her by who provides most history.  Reported shortness of breath with walking which is chronic.  This been ongoing prior to his CABG.  He reports extreme fatigue with walking.  No associated chest tightness.  Denies dizziness, palpitation, orthopnea, PND, syncope, lower extremity edema or melena.  Past Medical History:  Diagnosis Date  . Acute ischemic stroke ([place]) 09/15/2015  . Ankle fracture, left 2009   rod placed  . Anxiety   . Aspirin allergy    a. Anaphylactic rxn.  . Barrett's esophagus   . CAD (coronary artery disease)    a. Inf STEMI 07/2013: s/p DES x 2 from ostial to [facility] 07/13/13. Residual Cx disease for med rx (PCI if recurrent pain).  . Dyslipidemia   . ED (erectile dysfunction)   . GERD (gastroesophageal reflux disease)   . History of nuclear stress test    Nuclear stress test 5/19: EF 53, no ischemia, no infarction; Low Risk  . Hydrocele of testis   . Hyperglycemia    a. A1C 5.8 in 07/2013.  [name] Hypertension   . Myocardial infarction ([place])   . 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 cataract surgery      bilateral   . CORONARY ANGIOPLASTY     stents   . CORONARY ARTERY BYPASS GRAFT N/A 12/12/2018   Procedure: CORONARY ARTERY BYPASS GRAFTING (CABG);  Surgeon: Rexene Alberts, MD;  Location: [place];  Service: Open Heart Surgery;  Laterality: N/A;  . HYDROCELE EXCISION Left 07/17/2014   Procedure: LEFT HYDROCELECTOMY;  Surgeon: Malka So, MD;  Location: [facility];  Service: Urology;  Laterality: Left;  . left ankle surgery  1999  . LEFT HEART CATH AND CORONARY ANGIOGRAPHY N/A 12/11/2018   Procedure: LEFT HEART CATH AND CORONARY ANGIOGRAPHY;  Surgeon: Belva Crome, MD;  Location: [place] CV LAB;  Service: Cardiovascular;  Laterality: N/A;  . LEFT HEART CATHETERIZATION WITH CORONARY ANGIOGRAM N/A 07/13/2013   Procedure: LEFT HEART CATHETERIZATION WITH CORONARY ANGIOGRAM;  Surgeon: Sinclair Grooms, MD;  Location: [facility] CATH LAB;  Service: Cardiovascular;  Laterality: N/A;  . PERCUTANEOUS CORONARY STENT INTERVENTION (PCI-S)  07/13/2013   Procedure: PERCUTANEOUS CORONARY STENT INTERVENTION (PCI-S);  Surgeon: Sinclair Grooms, MD;  Location: [facility] CATH LAB;  Service: Cardiovascular;;  . TEE WITHOUT CARDIOVERSION N/A 12/12/2018   Procedure: TRANSESOPHAGEAL ECHOCARDIOGRAM (TEE);  Surgeon:  Rexene Alberts, MD;  Location: [place];  Service: Open Heart Surgery;  Laterality: N/A;    Current Medications: Current Meds  Medication Sig  . acetaminophen (TYLENOL) 500 MG tablet Take 1 tablet (500 mg total) by mouth every 6 (six) hours as needed for mild pain or headache.  . ALPRAZolam (XANAX) 0.25 MG tablet Take 0.25 mg by mouth 2 (two) times daily as needed for anxiety.   [name] atorvastatin (LIPITOR) 80 MG tablet Take 1 tablet (80 mg total) by mouth every evening.  . Cholecalciferol (VITAMIN D3) 50 MCG (2000 UT) capsule Take 2,000 Units by mouth daily.  . clopidogrel (PLAVIX) 75 MG tablet TAKE 1 TABLET BY  MOUTH EVERY DAY  . hydrochlorothiazide (MICROZIDE) 12.5 MG capsule TAKE 1 CAPSULE BY MOUTH EVERY DAY  . Lidocaine (LIDOCAINE PAIN RELIEF) 4 % PTCH Apply 2 patches topically every morning. Apply to lower back over site of pain  . lisinopril (ZESTRIL) 40 MG tablet Take 20 mg by mouth at bedtime.   . Multiple Vitamins-Minerals (ONE-A-DAY MENS 50+) TABS Take 1 tablet by mouth daily with breakfast.  . pantoprazole (PROTONIX) 40 MG tablet TAKE 1 TABLET BY MOUTH EVERY DAY  . traMADol (ULTRAM) 50 MG tablet Take 1 tablet (50 mg total) by mouth every 6 (six) hours as needed for severe pain.  . [DISCONTINUED] metoprolol tartrate (LOPRESSOR) 25 MG tablet TAKE 1 TABLET BY MOUTH TWICE A DAY     Allergies:   Aspirin and Quinine derivatives   Social History   Socioeconomic History  . Marital status: Married    Spouse name: [name]  . Number of children: 2  . Years of education: 57  . Highest education level: Not on file  Occupational History  . Occupation: Contractor  Tobacco Use  . Smoking status: Never Smoker  . Smokeless tobacco: Never Used  Vaping Use  . Vaping Use: Never used  Substance and Sexual Activity  . Alcohol use: Yes    Comment: occasional beer   . Drug use: No  . Sexual activity: Not Currently  Other Topics Concern  . Not on file  Social History Narrative   Lives with wife   Caffeine use: 1-2 cups coffee per day   Social Determinants of Health   Financial Resource Strain:   . Difficulty of Paying Living Expenses: Not on file  Food Insecurity:   . Worried About Charity fundraiser in the Last Year: Not on file  . Ran Out of Food in the Last Year: Not on file  Transportation Needs:   . Lack of Transportation (Medical): Not on file  . Lack of Transportation (Non-Medical): Not on file  Physical Activity:   . Days of Exercise per Week: Not on file  . Minutes of Exercise per Session: Not on file  Stress:   . Feeling of Stress : Not on file  Social  Connections:   . Frequency of Communication with Friends and Family: Not on file  . Frequency of Social Gatherings with Friends and Family: Not on file  . Attends Religious Services: Not on file  . Active Member of Clubs or Organizations: Not on file  . Attends Archivist Meetings: Not on file  . Marital Status: Not on file     Family History: The patient's family history includes Hypertension in his mother. There is no history of Stroke.   ROS:   Please see the history of present illness.    All other systems  reviewed and are negative.  EKGs/Labs/Other Studies Reviewed:    The following studies were reviewed today:  LEFT HEART CATH AND CORONARY ANGIOGRAPHY 12/2018  Conclusion   Thrombotic/bulky calcified distal left main greater than 90%.  99% ostial circumflex.  Mid to distal circumflex 95%.  Ostial LAD 99%.  Mid LAD beyond the first large diagonal 90%.  First diagonal ostium to proximal 50 to 60%  RCA is heavily calcified from just inside the ostium to the mid vessel with generalized 25% in-stent restenosis.  Ostium contains 50 to 60% narrowing.  No distal disease is noted in the RCA.  Normal systolic function.  LVEDP 16 mmHg.  RECOMMENDATIONS:   The patient has been chronically on Plavix because he is allergic to aspirin.  He took Plavix this morning.  Plavix will be discontinued and a P2 Y 12 assay will be done.  Hospitalized and start IV heparin and nitroglycerin as the patient is having multiple episodes of angina daily with minimal activity and an occasional episode at rest.  Currently pain-free.  Continue high intensity statin therapy.  TCTS consultation for surgery when appropriate based upon clinical judgment and the patient's status.   EKG:  EKG is ordered today.  The ekg ordered today demonstrates normal sinus rhythm with incomplete right bundle branch block  Recent Labs: 05/15/2020: BUN 24; Creatinine, Ser 0.95; Hemoglobin 11.5; Platelets 149;  Potassium 4.9; Sodium 138  Recent Lipid Panel    Component Value Date/Time   CHOL 108 03/20/2017 1331   TRIG 87 03/20/2017 1331   HDL 45 03/20/2017 1331   CHOLHDL 2.4 03/20/2017 1331   CHOLHDL 2.7 09/15/2015 0205   VLDL 22 09/15/2015 0205   LDLCALC 46 03/20/2017 1331     Physical Exam:    VS:  BP 140/70   Pulse (!) 52   Ht 5\' 8"  (1.727 m)   Wt 167 lb 6.4 oz (75.9 kg)   SpO2 98%   BMI 25.45 kg/m     Wt Readings from Last 3 Encounters:  09/02/20 167 lb 6.4 oz (75.9 kg)  05/15/20 165 lb (74.8 kg)  01/24/20 163 lb 6.4 oz (74.1 kg)     GEN:  Well nourished, well developed in no acute distress HEENT: Normal NECK: No JVD; No carotid bruits LYMPHATICS: No lymphadenopathy CARDIAC: RRR, no murmurs, rubs, gallops RESPIRATORY:  Clear to auscultation without rales, wheezing or rhonchi  ABDOMEN: Soft, non-tender, non-distended MUSCULOSKELETAL:  No edema; No deformity  SKIN: Warm and dry NEUROLOGIC:  Alert and oriented x 3 PSYCHIATRIC:  Normal affect   ASSESSMENT AND PLAN:    1. CAD s/p CABG 2. Dyspnea on exertion 3. Fatigue 4.  Sinus bradycardia  Presented with chronic shortness of breath however reported exertional fatigue.  Patient is very soft-spoken and hard to get any history from him.  Wife provided most of the history.  No exertional chest tightness.  He cannot tell his symptoms prior to his CABG.  No palpitation, orthopnea, PND, lower extremity edema..  Euvolemic on exam.  EKG without acute ischemic changes.  Highly suspected deconditioning and underlying depression.  Get CBC, TSH and BMP.  I have offered stress test but patient is unable to make any decision and frequently looking at her wife.  He will give [place] a call if wants to proceed with stress test.  Reduce metoprolol to improve heart rate which could be culprit.  5.  Hypertension -Initial blood pressure was 140/70.  Repeat check by myself 122/68. -Reduce metoprolol as above -Advised  to keep log of blood  pressure and give [place] call if systolic blood pressure above 140  Medication Adjustments/Labs and Tests Ordered: Current medicines are reviewed at length with the patient today.  Concerns regarding medicines are outlined above.  Orders Placed This Encounter  Procedures  . Basic metabolic panel  . CBC  . TSH  . EKG 12-Lead   Meds ordered this encounter  Medications  . metoprolol tartrate (LOPRESSOR) 25 MG tablet    Sig: Take 0.5 tablets (12.5 mg total) by mouth 2 (two) times daily.    Dispense:  90 tablet    Refill:  3    Patient Instructions  Medication Instructions:  Your physician has recommended you make the following change in your medication:  1.  REDUCE the Metoprolol to 25 mg taking 1/2 tablet twice a day   *If you need a refill on your cardiac medications before your next appointment, please call your pharmacy*   Lab Work: TODAY:  BMET, CBC, & TSH   If you have labs (blood work) drawn today and your tests are completely normal, you will receive your results only by: [name] MyChart Message (if you have MyChart) OR . A paper copy in the mail If you have any lab test that is abnormal or we need to change your treatment, we will call you to review the results.   Testing/Procedures: None ordered   Follow-Up: At [facility], you and your health needs are our priority.  As part of our continuing mission to provide you with exceptional heart care, we have created designated Provider Care Teams.  These Care Teams include your primary Cardiologist (physician) and Advanced Practice Providers (APPs -  Physician Assistants and Nurse Practitioners) who all work together to provide you with the care you need, when you need it.  We recommend signing up for the patient portal called "MyChart".  Sign up information is provided on this After Visit Summary.  MyChart is used to connect with patients for Virtual Visits (Telemedicine).  Patients are able to view lab/test results, encounter  notes, upcoming appointments, etc.  Non-urgent messages can be sent to your provider as well.   To learn more about what you can do with MyChart, go to [email or web address].    Your next appointment:   2 month(s)   11/05/20 ARRIVE AT 10:00 TO SEE DR. Tamala Julian   The format for your next appointment:   In Person  Provider:   Daneen Schick, MD   Other Instructions      Signed, Leanor Kail, PA  09/02/2020 9:04 AM    [place]

## 2020-09-02 NOTE — Patient Instructions (Addendum)
Medication Instructions:  Your physician has recommended you make the following change in your medication:  1.  REDUCE the Metoprolol to 25 mg taking 1/2 tablet twice a day   *If you need a refill on your cardiac medications before your next appointment, please call your pharmacy*   Lab Work: TODAY:  BMET, CBC, & TSH   If you have labs (blood work) drawn today and your tests are completely normal, you will receive your results only by: [name] MyChart Message (if you have MyChart) OR . A paper copy in the mail If you have any lab test that is abnormal or we need to change your treatment, we will call you to review the results.   Testing/Procedures: None ordered   Follow-Up: At [facility], you and your health needs are our priority.  As part of our continuing mission to provide you with exceptional heart care, we have created designated Provider Care Teams.  These Care Teams include your primary Cardiologist (physician) and Advanced Practice Providers (APPs -  Physician Assistants and Nurse Practitioners) who all work together to provide you with the care you need, when you need it.  We recommend signing up for the patient portal called "MyChart".  Sign up information is provided on this After Visit Summary.  MyChart is used to connect with patients for Virtual Visits (Telemedicine).  Patients are able to view lab/test results, encounter notes, upcoming appointments, etc.  Non-urgent messages can be sent to your provider as well.   To learn more about what you can do with MyChart, go to [email or web address].    Your next appointment:   2 month(s)   11/05/20 ARRIVE AT 10:00 TO SEE DR. Tamala Julian   The format for your next appointment:   In Person  Provider:   Daneen Schick, MD   Other Instructions

## 2020-09-09 ENCOUNTER — Telehealth: Payer: Self-pay | Admitting: Interventional Cardiology

## 2020-09-09 NOTE — Telephone Encounter (Signed)
Pt aware of lab results ./cy 

## 2020-09-09 NOTE — Telephone Encounter (Signed)
Renal function, potassium, sodium, hemoglobin and TSH are stable. This does not explains his symptoms. Forward copy to PCP.

## 2020-09-09 NOTE — Telephone Encounter (Signed)
Patient calling to get his results

## 2020-09-18 ENCOUNTER — Encounter: Payer: Self-pay | Admitting: *Deleted

## 2020-09-24 ENCOUNTER — Encounter: Payer: Self-pay | Admitting: *Deleted

## 2020-10-07 ENCOUNTER — Telehealth: Payer: Self-pay | Admitting: *Deleted

## 2020-10-07 NOTE — Telephone Encounter (Signed)
   [facility] Pre-operative Risk Assessment    HEARTCARE STAFF: - Please ensure there is not already an duplicate clearance open for this procedure. - Under Visit Info/Reason for Call, type in Other and utilize the format Clearance MM/DD/YY or Clearance TBD. Do not use dashes or single digits. - If request is for dental extraction, please clarify the # of teeth to be extracted.  Request for surgical clearance:  1. What type of surgery is being performed?  LES1, L3-L4   2. When is this surgery scheduled?  10/22/20  3. What type of clearance is required (medical clearance vs. Pharmacy clearance to hold med vs. Both)?  BOTH  4. Are there any medications that need to be held prior to surgery and how long? PLAVIX X'S 7 DAYS  5. Practice name and name of physician performing surgery?  [facility] / DR. Davy Pique   6. What is the office phone number?  [phone]   7.   What is the office fax number?  [phone]  8.   Anesthesia type (None, local, MAC, general) ?     Jeanann Lewandowsky 10/07/2020, 12:22 PM  _________________________________________________________________   (provider comments below)

## 2020-10-07 NOTE — Telephone Encounter (Signed)
Dr. Katrinka Blazing,  Matthew Liu is an 79 yo male with hx of CAD (prior stenting and CABG 2020), HTN, HLD. Seen 09/02/20 for dyspnea andfatigue. History taking was difficult though noted shortness of breath which was chronic and fatigue when walking but no chest pain. Deconditioning was suspected. Stress test offered, but patient deferred. His Metoprolol was reduced due to bradycardia. He has follow up with you 11/06/19 which is after the planned procedure.   Please comment on holding Plavix 7 days prior to LES1, L3-L4 and route response to P CV DIV PREOP.  Thanks,  Matthew Sorrow, NP

## 2020-10-08 NOTE — Telephone Encounter (Signed)
Okay to hold Plavix for 5-7 days

## 2020-10-08 NOTE — Telephone Encounter (Signed)
   Primary Cardiologist: Lesleigh Noe, MD  Chart reviewed as part of pre-operative protocol coverage. Patient was contacted 10/08/2020 in reference to pre-operative risk assessment for pending surgery as outlined below.  Matthew Liu was last seen on 09/02/20 by Chelsea Aus, PA.  Since that day, Matthew Liu has done well. He reports no chest pain, pressure, tightness and endorses improvement in his fatigue since last seen.  Therefore, based on ACC/AHA guidelines, the patient would be at acceptable risk for the planned procedure without further cardiovascular testing.   Per Dr. Katrinka Blazing, he may hold his Plavix 7 days prior to the planned procedure. Patient verbalized understanding of these instructions. Plavix should be resumed as soon as deemed safe by surgeon.   The patient was advised that if he develops new symptoms prior to surgery to contact our office to arrange for a follow-up visit, and he verbalized understanding.  I will route this recommendation to the requesting party via Epic fax function and remove from pre-op pool. Please call with questions.  Alver Sorrow, NP 10/08/2020, 12:52 PM

## 2020-10-22 DIAGNOSIS — L089 Local infection of the skin and subcutaneous tissue, unspecified: Secondary | ICD-10-CM | POA: Diagnosis not present

## 2020-10-22 DIAGNOSIS — T148XXA Other injury of unspecified body region, initial encounter: Secondary | ICD-10-CM | POA: Diagnosis not present

## 2020-11-02 DIAGNOSIS — M48061 Spinal stenosis, lumbar region without neurogenic claudication: Secondary | ICD-10-CM | POA: Diagnosis not present

## 2020-11-02 DIAGNOSIS — M5136 Other intervertebral disc degeneration, lumbar region: Secondary | ICD-10-CM | POA: Diagnosis not present

## 2020-11-02 DIAGNOSIS — M9953 Intervertebral disc stenosis of neural canal of lumbar region: Secondary | ICD-10-CM | POA: Diagnosis not present

## 2020-11-05 ENCOUNTER — Ambulatory Visit: Payer: Medicare PPO | Admitting: Interventional Cardiology

## 2020-11-30 DIAGNOSIS — R509 Fever, unspecified: Secondary | ICD-10-CM | POA: Diagnosis not present

## 2020-11-30 DIAGNOSIS — Z20822 Contact with and (suspected) exposure to covid-19: Secondary | ICD-10-CM | POA: Diagnosis not present

## 2020-11-30 DIAGNOSIS — G4733 Obstructive sleep apnea (adult) (pediatric): Secondary | ICD-10-CM | POA: Insufficient documentation

## 2020-11-30 DIAGNOSIS — Z951 Presence of aortocoronary bypass graft: Secondary | ICD-10-CM | POA: Diagnosis not present

## 2020-12-03 DIAGNOSIS — N390 Urinary tract infection, site not specified: Secondary | ICD-10-CM | POA: Diagnosis not present

## 2020-12-03 DIAGNOSIS — R509 Fever, unspecified: Secondary | ICD-10-CM | POA: Diagnosis not present

## 2020-12-14 DIAGNOSIS — R634 Abnormal weight loss: Secondary | ICD-10-CM | POA: Diagnosis not present

## 2020-12-14 DIAGNOSIS — E785 Hyperlipidemia, unspecified: Secondary | ICD-10-CM | POA: Diagnosis not present

## 2020-12-14 DIAGNOSIS — R399 Unspecified symptoms and signs involving the genitourinary system: Secondary | ICD-10-CM | POA: Diagnosis not present

## 2020-12-14 DIAGNOSIS — R7301 Impaired fasting glucose: Secondary | ICD-10-CM | POA: Diagnosis not present

## 2020-12-14 DIAGNOSIS — Z125 Encounter for screening for malignant neoplasm of prostate: Secondary | ICD-10-CM | POA: Diagnosis not present

## 2020-12-14 DIAGNOSIS — I10 Essential (primary) hypertension: Secondary | ICD-10-CM | POA: Diagnosis not present

## 2020-12-14 DIAGNOSIS — E538 Deficiency of other specified B group vitamins: Secondary | ICD-10-CM | POA: Diagnosis not present

## 2020-12-14 DIAGNOSIS — R531 Weakness: Secondary | ICD-10-CM | POA: Diagnosis not present

## 2020-12-14 DIAGNOSIS — D509 Iron deficiency anemia, unspecified: Secondary | ICD-10-CM | POA: Diagnosis not present

## 2020-12-17 ENCOUNTER — Telehealth: Payer: Self-pay | Admitting: Interventional Cardiology

## 2020-12-17 NOTE — Telephone Encounter (Signed)
Spoke with wife and she states pt continues to lay around in the bed and only gets up to eat.  Not eating very much.  Wife states at night while he is sleeping she will hear him get short of breath and at times gasp for air.  She didn't have BP readings near her but states sometimes they're normal and at other times they're high.  States Pt will get up and go to restroom and come back to bed shaking and complaining of feeling so weak.  Recently seen PCP and was told labs were fine.  Denies swelling or SOB when exerting.  Pt will tell wife at times that he's tired and feels like he's carrying something heavy on his back.  Scheduled pt to come in next week to see Richardson Dopp, PA-C.

## 2020-12-17 NOTE — Telephone Encounter (Signed)
Patient's wife called and stated that the needed a sooner appt than what they currently have. Says that patient stays in bed most of the day and stays tired. 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 Please call back to discuss

## 2020-12-22 ENCOUNTER — Ambulatory Visit: Payer: Medicare PPO | Admitting: Physician Assistant

## 2020-12-22 DIAGNOSIS — M545 Low back pain, unspecified: Secondary | ICD-10-CM | POA: Diagnosis not present

## 2020-12-22 DIAGNOSIS — M9953 Intervertebral disc stenosis of neural canal of lumbar region: Secondary | ICD-10-CM | POA: Diagnosis not present

## 2020-12-22 DIAGNOSIS — M5136 Other intervertebral disc degeneration, lumbar region: Secondary | ICD-10-CM | POA: Diagnosis not present

## 2020-12-23 ENCOUNTER — Encounter: Payer: Self-pay | Admitting: Gastroenterology

## 2021-01-27 ENCOUNTER — Ambulatory Visit (INDEPENDENT_AMBULATORY_CARE_PROVIDER_SITE_OTHER): Payer: Medicare PPO | Admitting: Gastroenterology

## 2021-01-27 ENCOUNTER — Other Ambulatory Visit (INDEPENDENT_AMBULATORY_CARE_PROVIDER_SITE_OTHER): Payer: Medicare PPO

## 2021-01-27 ENCOUNTER — Encounter: Payer: Self-pay | Admitting: Gastroenterology

## 2021-01-27 ENCOUNTER — Telehealth: Payer: Self-pay

## 2021-01-27 ENCOUNTER — Other Ambulatory Visit: Payer: Self-pay

## 2021-01-27 VITALS — BP 128/84 | HR 54 | Ht 69.0 in | Wt 154.4 lb

## 2021-01-27 DIAGNOSIS — K22719 Barrett's esophagus with dysplasia, unspecified: Secondary | ICD-10-CM | POA: Diagnosis not present

## 2021-01-27 DIAGNOSIS — K219 Gastro-esophageal reflux disease without esophagitis: Secondary | ICD-10-CM

## 2021-01-27 DIAGNOSIS — R634 Abnormal weight loss: Secondary | ICD-10-CM

## 2021-01-27 DIAGNOSIS — R1032 Left lower quadrant pain: Secondary | ICD-10-CM

## 2021-01-27 LAB — COMPREHENSIVE METABOLIC PANEL
ALT: 21 U/L (ref 0–53)
AST: 25 U/L (ref 0–37)
Albumin: 3.8 g/dL (ref 3.5–5.2)
Alkaline Phosphatase: 87 U/L (ref 39–117)
BUN: 17 mg/dL (ref 6–23)
CO2: 29 mEq/L (ref 19–32)
Calcium: 9.4 mg/dL (ref 8.4–10.5)
Chloride: 99 mEq/L (ref 96–112)
Creatinine, Ser: 1.02 mg/dL (ref 0.40–1.50)
GFR: 69.21 mL/min (ref >60.00)
Glucose, Bld: 96 mg/dL (ref 70–99)
Potassium: 4.4 mEq/L (ref 3.5–5.1)
Sodium: 136 mEq/L (ref 135–145)
Total Bilirubin: 0.8 mg/dL (ref 0.2–1.2)
Total Protein: 6.7 g/dL (ref 6.0–8.3)

## 2021-01-27 LAB — CBC WITH DIFFERENTIAL/PLATELET
Basophils Absolute: 0 10*3/uL (ref 0.0–0.1)
Basophils Relative: 0.6 % (ref 0.0–3.0)
Eosinophils Absolute: 0.2 10*3/uL (ref 0.0–0.7)
Eosinophils Relative: 2.6 % (ref 0.0–5.0)
HCT: 36.8 % — ABNORMAL LOW (ref 39.0–52.0)
Hemoglobin: 12.6 g/dL — ABNORMAL LOW (ref 13.0–17.0)
Lymphocytes Relative: 23.4 % (ref 12.0–46.0)
Lymphs Abs: 1.5 10*3/uL (ref 0.7–4.0)
MCHC: 34.3 g/dL (ref 30.0–36.0)
MCV: 100.6 fl — ABNORMAL HIGH (ref 78.0–100.0)
Monocytes Absolute: 0.7 10*3/uL (ref 0.1–1.0)
Monocytes Relative: 11.5 % (ref 3.0–12.0)
Neutro Abs: 3.9 10*3/uL (ref 1.4–7.7)
Neutrophils Relative %: 61.9 % (ref 43.0–77.0)
Platelets: 215 10*3/uL (ref 150.0–400.0)
RBC: 3.66 Mil/uL — ABNORMAL LOW (ref 4.22–5.81)
RDW: 15 % (ref 11.5–15.5)
WBC: 6.3 10*3/uL (ref 4.0–10.5)

## 2021-01-27 MED ORDER — NA SULFATE-K SULFATE-MG SULF 17.5-3.13-1.6 GM/177ML PO SOLN: 1.0000 | Freq: Once | ORAL | 0 refills | Status: AC

## 2021-01-27 MED ORDER — PANTOPRAZOLE SODIUM 40 MG PO TBEC: 40.0000 mg | DELAYED_RELEASE_TABLET | Freq: Every day | ORAL | 3 refills | Status: DC

## 2021-01-27 NOTE — Telephone Encounter (Signed)
Okay to proceed with plans to hold Plavix if no symptoms suggesting angina or heart failure.

## 2021-01-27 NOTE — Telephone Encounter (Signed)
[facility] Pre-operative Risk Assessment     Request for surgical clearance:     Endoscopy Procedure  What type of surgery is being performed?     03/03/2021  When is this surgery scheduled?     EGD/Colon  What type of clearance is required ?   Pharmacy  Are there any medications that need to be held prior to surgery and how long? Plavix 5 days hold  Practice name and name of physician performing surgery?      [facility]  What is your office phone and fax number?      Phone- [phone]  [phone]  Anesthesia type (None, local, MAC, general) ?       MAC

## 2021-01-27 NOTE — Patient Instructions (Addendum)
If you are age 81 or older, your body mass index should be between 23-30. Your Body mass index is 22.8 kg/m. If this is out of the aforementioned range listed, please consider follow up with your Primary Care Provider.  If you are age 54 or younger, your body mass index should be between 19-25. Your Body mass index is 22.8 kg/m. If this is out of the aformentioned range listed, please consider follow up with your Primary Care Provider.   [facility]  [id number] [address]   You are scheduled on Feb 23, 2021 at 930am. You should arrive 15 minutes prior to your appointment time for registration. Please follow the written instructions below on the day of your exam:  WARNING: IF YOU ARE ALLERGIC TO IODINE/X-RAY DYE, PLEASE NOTIFY RADIOLOGY IMMEDIATELY AT [phone]! YOU WILL BE GIVEN A 13 HOUR PREMEDICATION PREP.  1) Do not eat or drink anything after 530am (4 hours prior to your test) 2) You have been given 2 bottles of oral contrast to drink. The solution may taste better if refrigerated, but do NOT add ice or any other liquid to this solution. Shake well before drinking.    Drink 1 bottle of contrast @ 730am (2 hours prior to your exam)  Drink 1 bottle of contrast @ 830am (1 hour prior to your exam)  You may take any medications as prescribed with a small amount of water, if necessary. If you take any of the following medications: METFORMIN, GLUCOPHAGE, GLUCOVANCE, AVANDAMET, RIOMET, FORTAMET, [place] MET, JANUMET, GLUMETZA or METAGLIP, you MAY be asked to HOLD this medication 48 hours AFTER the exam.  The purpose of you drinking the oral contrast is to aid in the visualization of your intestinal tract. The contrast solution may cause some diarrhea. Depending on your individual set of symptoms, you may also receive an intravenous injection of x-ray contrast/dye. Plan on being  at [facility] for 30 minutes or longer, depending on the type of exam you are having performed.  This test typically takes 30-45 minutes to complete.  If you have any questions regarding your exam or if you need to reschedule, you may call the CT department at [phone] between the hours of 8:00 am and 5:00 pm, Monday-Friday.  ________________________________________________________________________   Matthew Liu have been scheduled for an endoscopy and colonoscopy. Please follow the written instructions given to you at your visit today. Please pick up your prep supplies at the pharmacy within the next 1-3 days. If you use inhalers (even only as needed), please bring them with you on the day of your procedure.  Please go to the lab on the 2nd floor [address] before you leave the office today.   Thank you,  Dr. Jackquline Denmark

## 2021-01-27 NOTE — Telephone Encounter (Signed)
Left message for the patient to call back and speak to the on-call preop APP of the day to make sure he has not had any recent anginal symptoms.  Dr. Tamala Julian to review, patient had a history of CAD status post CABG in 2020, hypertension and hyperlipidemia.  He was last seen by Gilberto Better on 09/02/2020 at which time he was doing well.  As long as the patient does not have any new anginal symptoms, would you be okay with him holding Plavix for 5 days prior to GI procedure?  Please forward your response to P CV DIV PREOP  Thank you

## 2021-01-27 NOTE — Telephone Encounter (Signed)
   Name: Matthew Liu  DOB: 23-Feb-1941  MRN: [id number]   Primary Cardiologist: Sinclair Grooms, MD  Chart reviewed as part of pre-operative protocol coverage. Patient was contacted 01/27/2021 in reference to pre-operative risk assessment for pending surgery as outlined below.  Matthew Liu was last seen on 09/02/2020 by Gilberto Better.  Since that day, Matthew Liu has done well without chest pain or worsening dyspnea.  Therefore, based on ACC/AHA guidelines, the patient would be at acceptable risk for the planned procedure without further cardiovascular testing.   Per Dr. Tamala Julian, okay to hold Plavix for 5 days prior to the procedure and restart soon as possible afterward at the surgeon's discretion.  The patient was advised that if he develops new symptoms prior to surgery to contact our office to arrange for a follow-up visit, and he verbalized understanding.  I will route this recommendation to the requesting party via Epic fax function and remove from pre-op pool. Please call with questions.  [place], [place] 01/27/2021, 3:59 PM

## 2021-01-27 NOTE — Progress Notes (Signed)
Chief Complaint:   Referring Provider:  Nicoletta Dress, MD      ASSESSMENT AND PLAN;   #1. GERD with Barrett's  #2. abdo pain LLQ  #3. Wt loss  Plan: -CT AP with PO/IV contrast -CBC, CMP -EGD/colon after holding plavix 5 days and cardioclearence fron Dr Daneen Schick -protonix 40mg  po qd   HPI:    Matthew Liu is a 80 y.o. male  CAD s/p prior stenting and CABG 2020, Nl EF 60-65% (12/2018) HTN, HLD, GERD with Barrett's esophagus, anxiety, CVA, ?Prediabetes, back surgery, recent vertebral fracture after fall  With LLQ abdominal discomfort, associated with weight loss without any significant diarrhea or constipation.  Also has been eating 1 meal per day with history of ?  Early satiety.  No odynophagia or dysphagia.  He has history of Barrett's esophagus negative for repeat EGD.  Also found to have mild anemia with hemoglobin 12.4, MCV 101 (December 17, 2020).  Iron studies are normal with iron saturation 38%, iron 78, TIBC 125, TIBC 203.  Normal B12 at 744  Wt loss 165 to 154lb  Nex week cardio Dr Daneen Schick   Past GI procedures: EGD 03/2016: Short segment Barrett's esophagus (confirmed on biopsies with negative dysplasia), gastric polyps s/p polypectomy (bx-fundic gland polyps), small hiatal hernia. Colonoscopy 10/2004: Scattered diverticula, hemorrhoids.  He refused repeat colonoscopy in 2016. Past Medical History:  Diagnosis Date  . Acute ischemic stroke ([place]) 09/15/2015  . Ankle fracture, left 2009   rod placed  . Anxiety   . Aspirin allergy    a. Anaphylactic rxn.  . Barrett's esophagus   . CAD (coronary artery disease)    a. Inf STEMI 07/2013: s/p DES x 2 from ostial to [facility] 07/13/13. Residual Cx disease for med rx (PCI if recurrent pain).  . Controlled gout   . Dyslipidemia   . ED (erectile dysfunction)   . Edema of both legs   . Gastric polyp   . GERD (gastroesophageal reflux disease)   . Hiatal hernia   . History of nuclear stress test    Nuclear  stress test 5/19: EF 53, no ischemia, no infarction; Low Risk  . Hydrocele of testis   . Hyperglycemia    a. A1C 5.8 in 07/2013.  [name] Hypertension   . Myocardial infarction ([place])   . 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 BACK SURGERY     L1 kyphoplasty by Dr Trenton Gammon 03/2018     thoractic [phone]  . cataract surgery      bilateral   . COLONOSCOPY    . CORONARY ANGIOPLASTY     stents   . CORONARY ARTERY BYPASS GRAFT N/A 12/12/2018   Procedure: CORONARY ARTERY BYPASS GRAFTING (CABG);  Surgeon: Rexene Alberts, MD;  Location: [place];  Service: Open Heart Surgery;  Laterality: N/A;  . ESOPHAGOGASTRODUODENOSCOPY  04/04/2016   Short-segment Barrett's esophagus (biopsied) Gastric polyps (status post polypectomy x1) Hiatal hernia  . HYDROCELE EXCISION Left 07/17/2014   Procedure: LEFT HYDROCELECTOMY;  Surgeon: Malka So, MD;  [facility];  Service: Urology;  Laterality: Left;  . left ankle surgery  1999  . LEFT HEART CATH AND CORONARY ANGIOGRAPHY N/A 12/11/2018   Procedure: LEFT HEART CATH AND CORONARY ANGIOGRAPHY;  Surgeon: Belva Crome, MD;  Location: [place]  CV LAB;  Service: Cardiovascular;  Laterality: N/A;  . LEFT HEART CATHETERIZATION WITH CORONARY ANGIOGRAM N/A 07/13/2013   Procedure: LEFT HEART CATHETERIZATION WITH CORONARY ANGIOGRAM;  Surgeon: Sinclair Grooms, MD;  Location: [facility] CATH LAB;  Service: Cardiovascular;  Laterality: N/A;  . PERCUTANEOUS CORONARY STENT INTERVENTION (PCI-S)  07/13/2013   Procedure: PERCUTANEOUS CORONARY STENT INTERVENTION (PCI-S);  Surgeon: Sinclair Grooms, MD;  Location: [facility] CATH LAB;  Service: Cardiovascular;;  . TEE WITHOUT CARDIOVERSION N/A 12/12/2018   Procedure: TRANSESOPHAGEAL ECHOCARDIOGRAM (TEE);  Surgeon: Rexene Alberts, MD;  Location: [place];  Service: Open Heart Surgery;  Laterality: N/A;  . TONSILLECTOMY       Family History  Problem Relation Age of Onset  . Hypertension Mother   . Ovarian cancer Mother   . Heart failure Father   . Stroke Neg Hx     Social History   Tobacco Use  . Smoking status: Never Smoker  . Smokeless tobacco: Never Used  Vaping Use  . Vaping Use: Never used  Substance Use Topics  . Alcohol use: Yes    Comment: occasional beer   . Drug use: No    Current Outpatient Medications  Medication Sig Dispense Refill  . acetaminophen (TYLENOL) 500 MG tablet Take 1 tablet (500 mg total) by mouth every 6 (six) hours as needed for mild pain or headache. 30 tablet 0  . ALPRAZolam (XANAX) 0.25 MG tablet Take 0.25 mg by mouth 2 (two) times daily as needed for anxiety.     [name] atorvastatin (LIPITOR) 80 MG tablet Take 1 tablet (80 mg total) by mouth every evening. 90 tablet 3  . Cholecalciferol (VITAMIN D3) 50 MCG (2000 UT) capsule Take 2,000 Units by mouth daily.    . clopidogrel (PLAVIX) 75 MG tablet TAKE 1 TABLET BY MOUTH EVERY DAY 90 tablet 2  . hydrochlorothiazide (MICROZIDE) 12.5 MG capsule TAKE 1 CAPSULE BY MOUTH EVERY DAY 90 capsule 3  . Lidocaine (LIDOCAINE PAIN RELIEF) 4 % PTCH Apply 2 patches topically every morning. Apply to lower back over site of pain 15 patch 2  . lisinopril (ZESTRIL) 40 MG tablet Take 20 mg by mouth at bedtime.     . Multiple Vitamins-Minerals (ONE-A-DAY MENS 50+) TABS Take 1 tablet by mouth daily with breakfast.    . pantoprazole (PROTONIX) 40 MG tablet TAKE 1 TABLET BY MOUTH EVERY DAY 90 tablet 3  . traMADol (ULTRAM) 50 MG tablet Take 1 tablet (50 mg total) by mouth every 6 (six) hours as needed for severe pain. 28 tablet 0  . metoprolol tartrate (LOPRESSOR) 25 MG tablet Take 0.5 tablets (12.5 mg total) by mouth 2 (two) times daily. 90 tablet 3   No current facility-administered medications for this visit.    Allergies  Allergen Reactions  . Aspirin Hives and Shortness Of Breath    "Shortness of Breath" can't breathe  . Quinine  Derivatives Hives and Other (See Comments)    "looks like he has the measles"    Review of Systems:  Constitutional: Denies fever, chills, diaphoresis, appetite change and fatigue.  HEENT: Denies photophobia, eye pain, redness, hearing loss, ear pain, congestion, sore throat, rhinorrhea, sneezing, mouth sores, neck pain, neck stiffness and tinnitus.   Respiratory: Has chronic SOB, DOE. No cough, chest tightness,  and wheezing.   Cardiovascular: Denies chest pain, palpitations and leg swelling.  Genitourinary: Denies dysuria, urgency, frequency, hematuria, flank pain and difficulty urinating.  Musculoskeletal: Denies myalgias, back pain, joint swelling, arthralgias and gait  problem.  Skin: No rash.  Neurological: Denies dizziness, seizures, syncope, weakness, light-headedness, numbness and headaches.  Hematological: Denies adenopathy. Easy bruising, personal or family bleeding history  Psychiatric/Behavioral: Has anxiety or depression     Physical Exam:    BP 128/84 (BP Location: Left Arm, Patient Position: Sitting, Cuff Size: Normal)   Pulse (!) 54   Ht 5\' 9"  (1.753 m)   Wt 154 lb 6 oz (70 kg)   BMI 22.80 kg/m  Wt Readings from Last 3 Encounters:  01/27/21 154 lb 6 oz (70 kg)  09/02/20 167 lb 6.4 oz (75.9 kg)  05/15/20 165 lb (74.8 kg)   Constitutional:  Well-developed, in no acute distress. Psychiatric: Normal mood and affect. Behavior is normal. HEENT: Pupils normal.  Conjunctivae are normal. No scleral icterus. Neck supple.  Cardiovascular: Normal rate, regular rhythm. No edema Pulmonary/chest: Effort normal and breath sounds normal. No wheezing, rales or rhonchi. Abdominal: Soft, nondistended. Nontender. Bowel sounds active throughout. There are no masses palpable. No hepatomegaly. Rectal: Deferred Neurological: Alert and oriented to person place and time. Skin: Skin is warm and dry. No rashes noted.  Data Reviewed: I have personally reviewed following labs and imaging  studies  CBC: CBC Latest Ref Rng & Units 09/02/2020 05/15/2020 12/17/2018  WBC 3.4 - 10.8 x10E3/uL 5.2 5.1 5.5  Hemoglobin 13.0 - 17.7 g/dL 12.4(L) 11.5(L) 8.4(L)  Hematocrit 37.5 - 51.0 % 35.2(L) 35.3(L) 24.9(L)  Platelets 150 - 450 x10E3/uL 157 149(L) 145(L)    CMP: CMP Latest Ref Rng & Units 09/02/2020 05/15/2020 12/17/2018  Glucose 65 - 99 mg/dL 105(H) 98 122(H)  BUN 8 - 27 mg/dL 20 24(H) 22  Creatinine 0.76 - 1.27 mg/dL 1.01 0.95 1.05  Sodium 134 - 144 mmol/L 138 138 138  Potassium 3.5 - 5.2 mmol/L 4.3 4.9 3.5  Chloride 96 - 106 mmol/L 102 107 103  CO2 20 - 29 mmol/L 25 24 26   Calcium 8.6 - 10.2 mg/dL 8.9 8.8(L) 7.8(L)  Total Protein 6.5 - 8.1 g/dL - - -  Total Bilirubin 0.3 - 1.2 mg/dL - - -  Alkaline Phos 38 - 126 U/L - - -  AST 15 - 41 U/L - - -  ALT 0 - 44 U/L - - -    GFR: CrCl cannot be calculated (Patient's most recent lab result is older than the maximum 21 days allowed.). Liver Function Tests: No results for input(s): AST, ALT, ALKPHOS, BILITOT, PROT, ALBUMIN in the last 168 hours. No results for input(s): LIPASE, AMYLASE in the last 168 hours. No results for input(s): AMMONIA in the last 168 hours. Coagulation Profile: No results for input(s): INR, PROTIME in the last 168 hours. HbA1C: No results for input(s): HGBA1C in the last 72 hours. Lipid Profile: No results for input(s): CHOL, HDL, LDLCALC, TRIG, CHOLHDL, LDLDIRECT in the last 72 hours. Thyroid Function Tests: No results for input(s): TSH, T4TOTAL, FREET4, T3FREE, THYROIDAB in the last 72 hours. Anemia Panel: No results for input(s): VITAMINB12, FOLATE, FERRITIN, TIBC, IRON, RETICCTPCT in the last 72 hours.  No results found for this or any previous visit (from the past 240 hour(s)).    Radiology Studies: No results found.    Carmell Austria, MD 01/27/2021, 10:44 AM  Cc: Nicoletta Dress, MD

## 2021-02-23 ENCOUNTER — Other Ambulatory Visit: Payer: Self-pay

## 2021-02-23 ENCOUNTER — Encounter (HOSPITAL_BASED_OUTPATIENT_CLINIC_OR_DEPARTMENT_OTHER): Payer: Self-pay

## 2021-02-23 ENCOUNTER — Ambulatory Visit (HOSPITAL_BASED_OUTPATIENT_CLINIC_OR_DEPARTMENT_OTHER)
Admission: RE | Admit: 2021-02-23 | Discharge: 2021-02-23 | Disposition: A | Discharge status: Home / Self Care | Payer: Medicare PPO | Source: Ambulatory Visit | Attending: Gastroenterology | Admitting: Gastroenterology

## 2021-02-23 DIAGNOSIS — R634 Abnormal weight loss: Secondary | ICD-10-CM

## 2021-02-23 DIAGNOSIS — K219 Gastro-esophageal reflux disease without esophagitis: Secondary | ICD-10-CM | POA: Diagnosis not present

## 2021-02-23 DIAGNOSIS — K22719 Barrett's esophagus with dysplasia, unspecified: Secondary | ICD-10-CM | POA: Insufficient documentation

## 2021-02-23 DIAGNOSIS — R1032 Left lower quadrant pain: Secondary | ICD-10-CM | POA: Insufficient documentation

## 2021-02-23 DIAGNOSIS — I714 Abdominal aortic aneurysm, without rupture: Secondary | ICD-10-CM | POA: Diagnosis not present

## 2021-02-23 MED ORDER — IOHEXOL 300 MG/ML  SOLN
75.0000 mL | Freq: Once | INTRAMUSCULAR | Status: AC | PRN
  Administered 2021-02-23: 75 mL via INTRAVENOUS

## 2021-02-28 NOTE — Progress Notes (Signed)
Cardiology Office Note:    Date:  03/01/2021   ID:  ARTEZ Liu, DOB [birth year], MRN [id number]  PCP:  Nicoletta Dress, MD  Cardiologist:  Sinclair Grooms, MD   Referring MD: Nicoletta Dress, MD   Chief Complaint  Patient presents with  . Coronary Artery Disease  . Hypertension    History of Present Illness:    Matthew Liu is a 80 y.o. male with a hx of CAD s/p prior stenting and CABG in 2020, HTN, HLD, and dyspnea on exertion.   Matthew Liu is accompanied by his wife.  She states that his gait has slowed, he is having difficulty with balance, he has fallen once, he has a tremor, and she feels like something is wrong.  Appetite is been okay but she is concerned about weight loss.  He has not had chest discomfort and denies dyspnea.  No lower extremity swelling.    Past Medical History:  Diagnosis Date  . Acute ischemic stroke (Matthew Liu) 09/15/2015  . Ankle fracture, left 2009   rod placed  . Anxiety   . Aspirin allergy    a. Anaphylactic rxn.  . Barrett's esophagus   . CAD (coronary artery disease)    a. Inf STEMI 07/2013: s/p DES x 2 from ostial to [facility] 07/13/13. Residual Cx disease for med rx (PCI if recurrent pain).  . Controlled gout   . Dyslipidemia   . ED (erectile dysfunction)   . Edema of both legs   . Gastric polyp   . GERD (gastroesophageal reflux disease)   . Hiatal hernia   . History of nuclear stress test    Nuclear stress test 5/19: EF 53, no ischemia, no infarction; Low Risk  . Hydrocele of testis   . Hyperglycemia    a. A1C 5.8 in 07/2013.  [name] Hypertension   . Myocardial infarction (Matthew Liu)   . 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 BACK SURGERY     L1 kyphoplasty by Dr Trenton Gammon 03/2018     thoractic [phone]  . cataract surgery      bilateral   . COLONOSCOPY    . CORONARY ANGIOPLASTY     stents   . CORONARY  ARTERY BYPASS GRAFT N/A 12/12/2018   Procedure: CORONARY ARTERY BYPASS GRAFTING (CABG);  Surgeon: Rexene Alberts, MD;  Location: [place];  Service: Open Heart Surgery;  Laterality: N/A;  . ESOPHAGOGASTRODUODENOSCOPY  04/04/2016   Short-segment Barrett's esophagus (biopsied) Gastric polyps (status post polypectomy x1) Hiatal hernia  . HYDROCELE EXCISION Left 07/17/2014   Procedure: LEFT HYDROCELECTOMY;  Surgeon: Malka So, MD;  [facility];  Service: Urology;  Laterality: Left;  . left ankle surgery  1999  . LEFT HEART CATH AND CORONARY ANGIOGRAPHY N/A 12/11/2018   Procedure: LEFT HEART CATH AND CORONARY ANGIOGRAPHY;  Surgeon: Belva Crome, MD;  Location: [place] CV LAB;  Service: Cardiovascular;  Laterality: N/A;  . LEFT HEART CATHETERIZATION WITH CORONARY ANGIOGRAM N/A 07/13/2013   Procedure: LEFT HEART CATHETERIZATION WITH CORONARY ANGIOGRAM;  Surgeon: Sinclair Grooms, MD;  Location: [facility] CATH LAB;  Service: Cardiovascular;  Laterality: N/A;  . PERCUTANEOUS CORONARY STENT INTERVENTION (PCI-S)  07/13/2013   Procedure: PERCUTANEOUS CORONARY STENT INTERVENTION (PCI-S);  Surgeon: Sinclair Grooms, MD;  Location: [facility] CATH LAB;  Service: Cardiovascular;;  . TEE WITHOUT CARDIOVERSION N/A 12/12/2018   Procedure: TRANSESOPHAGEAL ECHOCARDIOGRAM (TEE);  Surgeon: Rexene Alberts, MD;  Location: [place];  Service: Open Heart Surgery;  Laterality: N/A;  . TONSILLECTOMY      Current Medications: Current Meds  Medication Sig  . acetaminophen (TYLENOL) 500 MG tablet Take 1 tablet (500 mg total) by mouth every 6 (six) hours as needed for mild pain or headache.  . ALPRAZolam (XANAX) 0.25 MG tablet Take 0.25 mg by mouth 2 (two) times daily as needed for anxiety.   [name] atorvastatin (LIPITOR) 80 MG tablet Take 1 tablet (80 mg total) by mouth every evening.  . Cholecalciferol (VITAMIN D3) 50 MCG (2000 UT) capsule Take 2,000 Units by mouth daily.  . clopidogrel (PLAVIX) 75 MG tablet TAKE 1 TABLET BY MOUTH EVERY  DAY  . hydrochlorothiazide (MICROZIDE) 12.5 MG capsule TAKE 1 CAPSULE BY MOUTH EVERY DAY  . Lidocaine (LIDOCAINE PAIN RELIEF) 4 % PTCH Apply 2 patches topically every morning. Apply to lower back over site of pain  . lisinopril (ZESTRIL) 40 MG tablet Take 20 mg by mouth at bedtime.   . Multiple Vitamins-Minerals (ONE-A-DAY MENS 50+) TABS Take 1 tablet by mouth daily with breakfast.  . pantoprazole (PROTONIX) 40 MG tablet Take 1 tablet (40 mg total) by mouth daily.  . traMADol (ULTRAM) 50 MG tablet Take 1 tablet (50 mg total) by mouth every 6 (six) hours as needed for severe pain.     Allergies:   Aspirin and Quinine derivatives   Social History   Socioeconomic History  . Marital status: Married    Spouse name: [name]  . Number of children: 2  . Years of education: 74  . Highest education level: Not on file  Occupational History  . Occupation: Contractor  Tobacco Use  . Smoking status: Never Smoker  . Smokeless tobacco: Never Used  Vaping Use  . Vaping Use: Never used  Substance and Sexual Activity  . Alcohol use: Yes    Comment: occasional beer   . Drug use: No  . Sexual activity: Not Currently  Other Topics Concern  . Not on file  Social History Narrative   Lives with wife   Caffeine use: 1-2 cups coffee per day   Social Determinants of Health   Financial Resource Strain: Not on file  Food Insecurity: Not on file  Transportation Needs: Not on file  Physical Activity: Not on file  Stress: Not on file  Social Connections: Not on file     Family History: The patient's family history includes Heart failure in his father; Hypertension in his mother; Ovarian cancer in his mother. There is no history of Stroke.  ROS:   Please see the history of present illness.    He has lost 13 pounds since 3 coronary bypass in March 2020.  He is having GI work-up done by Dr. Lyndel Safe.  All other systems reviewed and are negative.  EKGs/Labs/Other Studies  Reviewed:    The following studies were reviewed today: No recent cardiac imaging  EKG:  EKG reveals sinus bradycardia with first-degree AV block but otherwise normal in November 2021.  Recent Labs: 09/02/2020: TSH 1.700 01/27/2021: ALT 21; BUN 17; Creatinine, Ser 1.02; Hemoglobin 12.6; Platelets 215.0; Potassium 4.4; Sodium 136  Recent Lipid Panel    Component Value Date/Time   CHOL 108 03/20/2017 1331   TRIG 87 03/20/2017 1331   HDL 45 03/20/2017 1331   CHOLHDL 2.4 03/20/2017 1331  CHOLHDL 2.7 09/15/2015 0205   VLDL 22 09/15/2015 0205   LDLCALC 46 03/20/2017 1331    Physical Exam:    VS:  BP 130/70   Pulse (!) 53   Ht 5\' 9"  (1.753 m)   Wt 152 lb 9.6 oz (69.2 kg)   SpO2 96%   BMI 22.54 kg/m     Wt Readings from Last 3 Encounters:  03/01/21 152 lb 9.6 oz (69.2 kg)  01/27/21 154 lb 6 oz (70 kg)  09/02/20 167 lb 6.4 oz (75.9 kg)     GEN: Appears compatible with stated age. No acute distress HEENT: Normal NECK: No JVD. LYMPHATICS: No lymphadenopathy CARDIAC: No murmur. RRR no gallop, or edema. VASCULAR:  Normal Pulses. No bruits. RESPIRATORY:  Clear to auscultation without rales, wheezing or rhonchi  ABDOMEN: Soft, non-tender, non-distended, No pulsatile mass, MUSCULOSKELETAL: No deformity  SKIN: Warm and dry NEUROLOGIC:  Alert and oriented x 3.  Wide-based gait, slow.  Slight bilateral resting tremor. PSYCHIATRIC: Emotionless face.  ASSESSMENT:    1. S/P CABG (coronary artery bypass graft)   2. Essential hypertension   3. Dyslipidemia   4. OSA (obstructive sleep apnea)   5. Acute ischemic stroke ([place])   6. Abnormal gait   7. Falls frequently   8. Memory deficit   9. Excessive daytime sleepiness   10. Weight loss    PLAN:    In order of problems listed above:  1. The patient does not appear to be having any difficulty from the standpoint of ischemia.  No chest pain, denies shortness of breath. 2. Today's blood pressure is excellent.  Continue same  therapy. 3. The lipids are currently excellent.  LDL cholesterol is 51 on current therapy.  Current therapy includes atorvastatin 80 mg/day. 4. 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  A sleep study is ordered.  This could also be having an impact on cognitive function. 5. Wife is concerned that he has had another stroke.  We will get neurological evaluation concerning difficulty with gait, decreased memory, and relatively frequent falls. 6. See above 7. See above 8. See above.  Rule out sleep apnea with sleep study. 9. Having GI work-up.  Overall education and awareness concerning secondary risk prevention was discussed in detail: LDL less than 70, hemoglobin A1c less than 7, blood pressure target less than 130/80 mmHg, >150 minutes of moderate aerobic activity per week, avoidance of smoking, weight control (via diet and exercise), and continued surveillance/management of/for obstructive sleep apnea.    Medication Adjustments/Labs and Tests Ordered: Current medicines are reviewed at length with the patient today.  Concerns regarding medicines are outlined above.  Orders Placed This Encounter  Procedures  . Ambulatory referral to Neurology  . Split night study   No orders of the defined types were placed in this encounter.   Patient Instructions  Medication Instructions:  Your physician recommends that you continue on your current medications as directed. Please refer to the Current Medication list given to you today.  *If you need a refill on your cardiac medications before your next appointment, please call your pharmacy*   Lab Work: None If you have labs (blood work) drawn today and your tests are completely normal, you will receive your results only by: [name] MyChart Message (if you have MyChart) OR . A paper copy in the mail If you have any lab test that is abnormal or  we need to change your treatment, we will call you to review the results.   Testing/Procedures: Your physician has recommended that you have a sleep study. This test records several body functions during sleep, including: brain activity, eye movement, oxygen and carbon dioxide blood levels, heart rate and rhythm, breathing rate and rhythm, the flow of air through your mouth and nose, snoring, body muscle movements, and chest and belly movement.   Follow-Up: At [facility], you and your health needs are our priority.  As part of our continuing mission to provide you with exceptional heart care, we have created designated Provider Care Teams.  These Care Teams include your primary Cardiologist (physician) and Advanced Practice Providers (APPs -  Physician Assistants and Nurse Practitioners) who all work together to provide you with the care you need, when you need it.  We recommend signing up for the patient portal called "MyChart".  Sign up information is provided on this After Visit Summary.  MyChart is used to connect with patients for Virtual Visits (Telemedicine).  Patients are able to view lab/test results, encounter notes, upcoming appointments, etc.  Non-urgent messages can be sent to your provider as well.   To learn more about what you can do with MyChart, go to [email or web address].    Your next appointment:   1 year(s)  The format for your next appointment:   In Person  Provider:   You may see Sinclair Grooms, MD or one of the following Advanced Practice Providers on your designated Care Team:    Kathyrn Drown, NP    Other Instructions  You have been referred to [facility].     Signed, Sinclair Grooms, MD  03/01/2021 3:45 PM    [facility]

## 2021-03-01 ENCOUNTER — Other Ambulatory Visit: Payer: Self-pay

## 2021-03-01 ENCOUNTER — Ambulatory Visit: Payer: Medicare PPO | Admitting: Interventional Cardiology

## 2021-03-01 ENCOUNTER — Telehealth: Payer: Self-pay | Admitting: *Deleted

## 2021-03-01 ENCOUNTER — Encounter: Payer: Self-pay | Admitting: Interventional Cardiology

## 2021-03-01 VITALS — BP 130/70 | HR 53 | Ht 69.0 in | Wt 152.6 lb

## 2021-03-01 DIAGNOSIS — Z951 Presence of aortocoronary bypass graft: Secondary | ICD-10-CM | POA: Diagnosis not present

## 2021-03-01 DIAGNOSIS — E785 Hyperlipidemia, unspecified: Secondary | ICD-10-CM

## 2021-03-01 DIAGNOSIS — R413 Other amnesia: Secondary | ICD-10-CM

## 2021-03-01 DIAGNOSIS — I10 Essential (primary) hypertension: Secondary | ICD-10-CM

## 2021-03-01 DIAGNOSIS — I639 Cerebral infarction, unspecified: Secondary | ICD-10-CM

## 2021-03-01 DIAGNOSIS — R296 Repeated falls: Secondary | ICD-10-CM

## 2021-03-01 DIAGNOSIS — R634 Abnormal weight loss: Secondary | ICD-10-CM

## 2021-03-01 DIAGNOSIS — R269 Unspecified abnormalities of gait and mobility: Secondary | ICD-10-CM | POA: Diagnosis not present

## 2021-03-01 DIAGNOSIS — G4733 Obstructive sleep apnea (adult) (pediatric): Secondary | ICD-10-CM

## 2021-03-01 DIAGNOSIS — G4719 Other hypersomnia: Secondary | ICD-10-CM

## 2021-03-01 NOTE — Patient Instructions (Signed)
Medication Instructions:  Your physician recommends that you continue on your current medications as directed. Please refer to the Current Medication list given to you today.  *If you need a refill on your cardiac medications before your next appointment, please call your pharmacy*   Lab Work: None If you have labs (blood work) drawn today and your tests are completely normal, you will receive your results only by: [name] MyChart Message (if you have MyChart) OR . A paper copy in the mail If you have any lab test that is abnormal or we need to change your treatment, we will call you to review the results.   Testing/Procedures: Your physician has recommended that you have a sleep study. This test records several body functions during sleep, including: brain activity, eye movement, oxygen and carbon dioxide blood levels, heart rate and rhythm, breathing rate and rhythm, the flow of air through your mouth and nose, snoring, body muscle movements, and chest and belly movement.   Follow-Up: At [facility], you and your health needs are our priority.  As part of our continuing mission to provide you with exceptional heart care, we have created designated Provider Care Teams.  These Care Teams include your primary Cardiologist (physician) and Advanced Practice Providers (APPs -  Physician Assistants and Nurse Practitioners) who all work together to provide you with the care you need, when you need it.  We recommend signing up for the patient portal called "MyChart".  Sign up information is provided on this After Visit Summary.  MyChart is used to connect with patients for Virtual Visits (Telemedicine).  Patients are able to view lab/test results, encounter notes, upcoming appointments, etc.  Non-urgent messages can be sent to your provider as well.   To learn more about what you can do with MyChart, go to [email or web address].    Your next appointment:   1 year(s)  The format for your next  appointment:   In Person  Provider:   You may see Sinclair Grooms, MD or one of the following Advanced Practice Providers on your designated Care Team:    Kathyrn Drown, NP    Other Instructions  You have been referred to [facility] Neurology.

## 2021-03-01 NOTE — Telephone Encounter (Signed)
-----   Message from Loren Racer, RN sent at 03/01/2021  3:35 PM EDT ----- Sleep study ordered

## 2021-03-03 ENCOUNTER — Other Ambulatory Visit: Payer: Self-pay

## 2021-03-03 ENCOUNTER — Encounter: Payer: Self-pay | Admitting: Gastroenterology

## 2021-03-03 ENCOUNTER — Ambulatory Visit (AMBULATORY_SURGERY_CENTER): Payer: Medicare PPO | Admitting: Gastroenterology

## 2021-03-03 VITALS — BP 154/66 | HR 57 | Temp 98.2°F | Resp 17 | Ht 69.0 in | Wt 154.0 lb

## 2021-03-03 DIAGNOSIS — D122 Benign neoplasm of ascending colon: Secondary | ICD-10-CM | POA: Diagnosis not present

## 2021-03-03 DIAGNOSIS — D123 Benign neoplasm of transverse colon: Secondary | ICD-10-CM

## 2021-03-03 DIAGNOSIS — K22719 Barrett's esophagus with dysplasia, unspecified: Secondary | ICD-10-CM

## 2021-03-03 DIAGNOSIS — K219 Gastro-esophageal reflux disease without esophagitis: Secondary | ICD-10-CM

## 2021-03-03 DIAGNOSIS — R1032 Left lower quadrant pain: Secondary | ICD-10-CM

## 2021-03-03 DIAGNOSIS — K640 First degree hemorrhoids: Secondary | ICD-10-CM | POA: Diagnosis not present

## 2021-03-03 DIAGNOSIS — R933 Abnormal findings on diagnostic imaging of other parts of digestive tract: Secondary | ICD-10-CM

## 2021-03-03 DIAGNOSIS — K2270 Barrett's esophagus without dysplasia: Secondary | ICD-10-CM | POA: Diagnosis not present

## 2021-03-03 DIAGNOSIS — K449 Diaphragmatic hernia without obstruction or gangrene: Secondary | ICD-10-CM | POA: Diagnosis not present

## 2021-03-03 DIAGNOSIS — R634 Abnormal weight loss: Secondary | ICD-10-CM | POA: Diagnosis not present

## 2021-03-03 DIAGNOSIS — D125 Benign neoplasm of sigmoid colon: Secondary | ICD-10-CM

## 2021-03-03 DIAGNOSIS — K5730 Diverticulosis of large intestine without perforation or abscess without bleeding: Secondary | ICD-10-CM

## 2021-03-03 MED ORDER — SODIUM CHLORIDE 0.9 % IV SOLN: 500.0000 mL | Freq: Once | INTRAVENOUS | Status: DC

## 2021-03-03 NOTE — Op Note (Signed)
[facility] Patient Name: Matthew Liu Procedure Date: 03/03/2021 3:29 PM MRN: [id number] Endoscopist: Jackquline Denmark , MD Age: 80 Referring MD:  Date of Birth: 03-27-1941 Gender: Male Account #: [id number] Procedure:                Colonoscopy Indications:              Abdominal pain in the left lower quadrant with                            negative CT Abdo/pelvis. Medicines:                Monitored Anesthesia Care Procedure:                Pre-Anesthesia Assessment:                           - Prior to the procedure, a History and Physical                            was performed, and patient medications and                            allergies were reviewed. The patient's tolerance of                            previous anesthesia was also reviewed. The risks                            and benefits of the procedure and the sedation                            options and risks were discussed with the patient.                            All questions were answered, and informed consent                            was obtained. Prior Anticoagulants: The patient has                            taken Plavix (clopidogrel), last dose was 5 days                            prior to procedure. ASA Grade Assessment: III - A                            patient with severe systemic disease. After                            reviewing the risks and benefits, the patient was                            deemed in satisfactory condition to undergo the  procedure.                           After obtaining informed consent, the colonoscope                            was passed under direct vision. Throughout the                            procedure, the patient's blood pressure, pulse, and                            oxygen saturations were monitored continuously. The                            Olympus PCF-H190DL (#[id number]) Colonoscope was                             introduced through the anus and advanced to the 2                            cm into the ileum. The colonoscopy was performed                            without difficulty. The patient tolerated the                            procedure well. The quality of the bowel                            preparation was good. The terminal ileum, ileocecal                            valve, appendiceal orifice, and rectum were                            photographed. Scope In: 3:50:02 PM Scope Out: 4:04:26 PM Scope Withdrawal Time: 0 hours 11 minutes 23 seconds  Total Procedure Duration: 0 hours 14 minutes 24 seconds  Findings:                 A 8 mm polyp was found in the mid ascending colon.                            The polyp was sessile. The polyp was removed with a                            cold snare. Resection and retrieval were complete.                           A 2 mm polyp was found in the distal sigmoid colon.                            The polyp was sessile. The polyp was removed with  a                            cold biopsy forceps. Resection and retrieval were                            complete.                           Multiple medium-mouthed diverticula were found in                            the sigmoid colon, few in descending colon and                            ascending colon. There was luminal narrowing in the                            sigmoid colon consistent with muscular hypertrophy.                            PCF H190 was passed beyond with ease. No SCAD.                           Non-bleeding internal hemorrhoids were found during                            retroflexion. The hemorrhoids were small.                           The terminal ileum appeared normal.                           The exam was otherwise without abnormality on                            direct and retroflexion views. Complications:            No immediate complications. Estimated Blood Loss:      Estimated blood loss: none. Impression:               - One 8 mm polyp in the mid ascending colon,                            removed with a cold snare. Resected and retrieved.                           - One 2 mm polyp in the distal sigmoid colon,                            removed with a cold biopsy forceps. Resected and                            retrieved.                           -  Moderate predominantly sigmoid diverticulosis.                           - Non-bleeding internal hemorrhoids.                           - The examined portion of the ileum was normal.                           - The examination was otherwise normal on direct                            and retroflexion views. Recommendation:           - Patient has a contact number available for                            emergencies. The signs and symptoms of potential                            delayed complications were discussed with the                            patient. Return to normal activities tomorrow.                            Written discharge instructions were provided to the                            patient.                           - Resume previous diet.                           - Continue present medications.                           - Await pathology results.                           - Repeat colonoscopy is not recommended for                            surveillance. Hence repeat colonoscopy only if                            clinically indicated.                           - Resume Plavix from tomorrow onwards.                           - The findings and recommendations were discussed                            with the patient's family. Jackquline Denmark, MD 03/03/2021 4:16:58  PM This report has been signed electronically.

## 2021-03-03 NOTE — Patient Instructions (Signed)
YOU HAD AN ENDOSCOPIC PROCEDURE TODAY AT THE [facility]:   Refer to the procedure report that was given to you for any specific questions about what was found during the examination.  If the procedure report does not answer your questions, please call your gastroenterologist to clarify.  If you requested that your care partner not be given the details of your procedure findings, then the procedure report has been included in a sealed envelope for you to review at your convenience later.  YOU SHOULD EXPECT: Some feelings of bloating in the abdomen. Passage of more gas than usual.  Walking can help get rid of the air that was put into your GI tract during the procedure and reduce the bloating. If you had a lower endoscopy (such as a colonoscopy or flexible sigmoidoscopy) you may notice spotting of blood in your stool or on the toilet paper. If you underwent a bowel prep for your procedure, you may not have a normal bowel movement for a few days.  Please Note:  You might notice some irritation and congestion in your nose or some drainage.  This is from the oxygen used during your procedure.  There is no need for concern and it should clear up in a day or so.  SYMPTOMS TO REPORT IMMEDIATELY:   Following lower endoscopy (colonoscopy or flexible sigmoidoscopy):  Excessive amounts of blood in the stool  Significant tenderness or worsening of abdominal pains  Swelling of the abdomen that is new, acute  Fever of 100F or higher   Following upper endoscopy (EGD)  Vomiting of blood or coffee ground material  New chest pain or pain under the shoulder blades  Painful or persistently difficult swallowing  New shortness of breath  Fever of 100F or higher  Black, tarry-looking stools  For urgent or emergent issues, a gastroenterologist can be reached at any hour by calling [phone]. Do not use MyChart messaging for urgent concerns.    DIET:  We do recommend a small meal at first, but  then you may proceed to your regular diet.  Drink plenty of fluids but you should avoid alcoholic beverages for 24 hours.  ACTIVITY:  You should plan to take it easy for the rest of today and you should NOT DRIVE or use heavy machinery until tomorrow (because of the sedation medicines used during the test).    FOLLOW UP: Our staff will call the number listed on your records 48-72 hours following your procedure to check on you and address any questions or concerns that you may have regarding the information given to you following your procedure. If we do not reach you, we will leave a message.  We will attempt to reach you two times.  During this call, we will ask if you have developed any symptoms of COVID 19. If you develop any symptoms (ie: fever, flu-like symptoms, shortness of breath, cough etc.) before then, please call [phone].  If you test positive for Covid 19 in the 2 weeks post procedure, please call and report this information to us.    If any biopsies were taken you will be contacted by phone or by letter within the next 1-3 weeks.  Please call us at [phone] if you have not heard about the biopsies in 3 weeks.    SIGNATURES/CONFIDENTIALITY: You and/or your care partner have signed paperwork which will be entered into your electronic medical record.  These signatures attest to the fact that that the information above on   your After Visit Summary has been reviewed and is understood.  Full responsibility of the confidentiality of this discharge information lies with you and/or your care-partner. 

## 2021-03-03 NOTE — Progress Notes (Signed)
VS by CW  I have reviewed the patient's medical history in detail and updated the computerized patient record.  

## 2021-03-03 NOTE — Progress Notes (Signed)
pt tolerated well. VSS. awake and to recovery. Report given to RN. Bite block left insitu to recovery. 

## 2021-03-03 NOTE — Op Note (Signed)
Matthew Liu: Matthew Liu Procedure Date: 03/03/2021 3:30 PM MRN: [id number] Endoscopist: Jackquline Denmark , MD Age: 80 Referring MD:  Date of Birth: 06-Nov-1940 Gender: Male Account #: [id number] Procedure:                Upper GI endoscopy Indications:              Follow-up of Barrett's esophagus Medicines:                Monitored Anesthesia Care Procedure:                Pre-Anesthesia Assessment:                           - Prior to the procedure, a History and Physical                            was performed, and patient medications and                            allergies were reviewed. The patient's tolerance of                            previous anesthesia was also reviewed. The risks                            and benefits of the procedure and the sedation                            options and risks were discussed with the patient.                            All questions were answered, and informed consent                            was obtained. Prior Anticoagulants: The patient has                            taken Plavix (clopidogrel), last dose was 5 days                            prior to procedure. ASA Grade Assessment: III - A                            patient with severe systemic disease. After                            reviewing the risks and benefits, the patient was                            deemed in satisfactory condition to undergo the                            procedure.  After obtaining informed consent, the endoscope was                            passed under direct vision. Throughout the                            procedure, the patient's blood pressure, pulse, and                            oxygen saturations were monitored continuously. The                            Endoscope was introduced through the mouth, and                            advanced to the second part of duodenum. The upper                             GI endoscopy was accomplished without difficulty.                            The patient tolerated the procedure well. Scope In: Scope Out: Findings:                 There were esophageal mucosal changes secondary to                            established short-segment Barrett's disease,                            projecting 1 cm above gastroesophageal junction                            (36-37 cm), examined by NBI. This was biopsied with                            a cold forceps for histology.                           A 3 cm hiatal hernia was present. Otherwise gastric                            mucosa was normal.                           The examined duodenum was normal. Complications:            No immediate complications. Estimated Blood Loss:     Estimated blood loss: none. Impression:               - Esophageal mucosal changes secondary to                            established short-segment Barrett's disease.  Biopsied.                           - 3 cm hiatal hernia. Recommendation:           - Patient has a contact number available for                            emergencies. The signs and symptoms of potential                            delayed complications were discussed with the                            patient. Return to normal activities tomorrow.                            Written discharge instructions were provided to the                            patient.                           - Resume previous diet.                           - Continue present medications.                           - Await pathology results.                           - Resume Plavix (clopidogrel) at prior dose                            tomorrow.                           - The findings and recommendations were discussed                            with the patient's family. Jackquline Denmark, MD 03/03/2021 4:11:36 PM This report has been signed  electronically.

## 2021-03-03 NOTE — Progress Notes (Signed)
Called to room to assist during endoscopic procedure.  Patient ID and intended procedure confirmed with present staff. Received instructions for my participation in the procedure from the performing physician.  

## 2021-03-05 ENCOUNTER — Telehealth: Payer: Self-pay

## 2021-03-05 NOTE — Telephone Encounter (Signed)
LVM

## 2021-03-09 ENCOUNTER — Other Ambulatory Visit: Payer: Self-pay

## 2021-03-09 MED ORDER — METOPROLOL TARTRATE 25 MG PO TABS: 12.5000 mg | ORAL_TABLET | Freq: Two times a day (BID) | ORAL | 3 refills | Status: DC

## 2021-03-09 NOTE — Telephone Encounter (Signed)
Pt's medication was sent to pt's pharmacy as requested. Confirmation received.  °

## 2021-03-10 ENCOUNTER — Encounter: Payer: Self-pay | Admitting: Gastroenterology

## 2021-03-11 ENCOUNTER — Encounter: Payer: Self-pay | Admitting: Physician Assistant

## 2021-03-12 ENCOUNTER — Telehealth: Payer: Self-pay | Admitting: Interventional Cardiology

## 2021-03-12 ENCOUNTER — Telehealth: Payer: Self-pay | Admitting: *Deleted

## 2021-03-12 NOTE — Telephone Encounter (Signed)
Prior Authorization for [facility] sent to [facility] via web portal. Tracking Number [id number].

## 2021-03-12 NOTE — Telephone Encounter (Signed)
STAT if patient feels like he/she is going to faint   1) Are you dizzy now? Yes  2) Do you feel faint or have you passed out? Yes  3) Do you have any other symptoms? Numbness in his legs  4) Have you checked your HR and BP (record if available)? NO

## 2021-03-12 NOTE — Telephone Encounter (Signed)
The patient was sitting at the table and got up and almost fell because he was lightheaded and his legs tried to give out on him. Was able to catch himself on the chair.   From OV note:   1. The patient does not appear to be having any difficulty from the standpoint of ischemia.  No chest pain, denies shortness of breath. 2. Today's blood pressure is excellent.  Continue same therapy. 3. The lipids are currently excellent.  LDL cholesterol is 51 on current therapy.  Current therapy includes atorvastatin 80 mg/day. 4. 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  A sleep study is ordered.  This could also be having an impact on cognitive function. 5. Wife is concerned that he has had another stroke.  We will get neurological evaluation concerning difficulty with gait, decreased memory, and relatively frequent falls.  After review of chart and discussion with wife about neuro new pt appointment scheduled 6/9. Advised patient's wife to call the office and let them know that the patient is having more frequent symptoms to see if they can see him sooner or if he needs to be seen somewhere else more urgently. Gave education about how to prevent falls. I reassured wife that based on Dr. Tamala Julian note this is not cardiac related. She verbalized understanding. Gave number to referred Neuro office for wife to call.

## 2021-03-17 NOTE — Progress Notes (Addendum)
Assessment/Plan:   80 y.o. year old male with risk factors including hypertension, hyperlipidemia, s/p ischemic stroke, OSA not on CPAP, CAD s/p CABG, anxiety,decreased hearing, seen today for evaluation of memory loss.  MoCA today is 22/30 with deficiencies in delayed recall and language, abstraction. Although his was able to draw a clock well, and copy a figure, tremors suspicious for Parkinsonism were noted, confirmed by physical exam, with intention and resting tremor and bilateral cogwheel, mild rigidity among other findings. Family history positive for dementia.  Case was discussed with Dr. Tomi Likens, who participated on the exam and confirmed the above findings.   Recommendations:   Memory Loss   Neurocognitive testing to further determine what causes memory changes including sleep, stress, anxiety depression MRI brain without contrast to further evaluate the anatomy and rule out stroke, bleeding, or other structural abnormalities Discussed safety both in and out of the home.  Discussed the importance of regular daily schedule with inclusion of crossword puzzles to maintain brain function.  Continue to monitor mood with PCP.  Stay active at least 30 minutes at least 3 times a week.  Naps should be scheduled and should be no longer than 60 minutes and should not occur after 2 PM.  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 Week 1: 0.5 tablet in morning and 0.5 tablet in evening/bedtime.  Week 2:  0.5 tablet in morning, 0.5 tablet in afternoon, and 0.5 tablet in evening/bedtime.  Week 3 & thereafter: 1 tablet three times daily.  Side effects including dizziness, nausea, vivid dreams and hallucinations.He was instructed to call if he develops any of these side effects       Subjective:    The patient is seen in neurologic consultation at the request of Belva Crome, MD for the evaluation of memory. The patient is  alone. He is a 80 y.o. year old male who has had memory issues for about  5 years, worse over the last year. He feels   "memory is pretty good for my age"  although his wife wanted him to be evaluated. He states that his speech has been slowing down lately, and if she would give him some time, he would do better. He states that she is a Restaurant manager, fast food job, which compromises her patience, "ans is like a general to her soldier". His mood is "particular", he has moments where he feels "low and depressed". He denies irritability, hallucinations or paranoia.  Sleeps "too much, and has excessive daytime sleepiness and lethargy". Denies vivid dreams or sleepwalking. Patient dresses up and bathes without help. Denies missing medications and is very organized.  Denies living objects in unusual places. Patient takes care of his own finances and does not miss a payment. Appetite is good and denies trouble swallowing.  The patient cooks and denies leaving the stove on or the faucet on. He has had falls recently and he has some trouble with gait, for which he is now walking with more caution.  He does not use a walker or cane. Patient continues to drive without GPS and denies getting lost. POsitive family history of dementia in his father.  He denies headaches. He had a head trauma in 2017 after falling and another trauma to the C-spine when he fell from a ladder which resulted in a hospitalization due to compression fracture. Denies double vision, dizziness, focal numbness or tingling, or unilateral weakness. He reports having tremors since  2019, and he is not sure if they began after falling. He is unsure of any family history of Parkinson's. He denies excessive drooling, leg cramping or a history of skin cancer.  He does notice his voice being softer and that he is slower than before. Denies urine incontinence or retention. Denies constipation or diarrhea. He did drink heavy at times in the past, but now he drinks one small  beer a day while doing yard work. Denies tobacco abuse.  He is a retired Loss adjuster, chartered, Administrator. 12th grade education     Most recent MRI brain wo contrast 06/21/16 1. No acute intracranial infarct or other process identified. 2. Age appropriate cerebral atrophy with mild chronic microvascular ischemic disease 3. Moderate inflammatory paranasal sinus disease.  MRI C spine wo contrast 01/09/18 after falling from ladder  1. C6 spinous process bone marrow edema, possible fracture. C5-6 interspinous ligament strain. 2. Old mild C7 compression fracture and minimal T1 chronic wedging. 3. Mild canal stenosis C4-5 and C5-6. Neural foraminal narrowing  C3-4 through C5-6: Severe at C4-5 and C5-6   CT L spine wo contrast  IMPRESSION: 1. No acute osseous abnormality. Multiple level degenerative changes. Treated chronic compression fracture L1  2. 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    Allergies  Allergen Reactions   Aspirin Hives and Shortness Of Breath    "Shortness of Breath" can't breathe   Quinine Derivatives Hives and Other (See Comments)    "looks like he has the measles"    Current Outpatient Medications  Medication Instructions   acetaminophen (TYLENOL) 500 mg, Oral, Every 6 hours PRN   ALPRAZolam (XANAX) 0.25 mg, Oral, 2 times daily PRN   atorvastatin (LIPITOR) 80 mg, Oral, Every evening   carbidopa-levodopa (SINEMET IR) 25-100 MG tablet Week 1: Take 0.5 tablet in morning and 0.5 tablet in evening/bedtime. Week 2: Take 0.5 tablet in morning, 0.5 tablet in afternoon, and 0.5 tablet in evening/bedtime. Week 3 & thereafter: Take 1 tablet three times daily   clopidogrel (PLAVIX) 75 MG tablet TAKE 1 TABLET BY MOUTH EVERY DAY   hydrochlorothiazide (MICROZIDE) 12.5 MG capsule TAKE 1 CAPSULE BY MOUTH EVERY DAY   lisinopril (ZESTRIL) 20 mg, Oral, Daily at bedtime   metoprolol tartrate (LOPRESSOR) 12.5 mg, Oral, 2 times daily    Multiple Vitamins-Minerals (ONE-A-DAY MENS 50+) TABS 1 tablet, Oral, Daily with breakfast   pantoprazole (PROTONIX) 40 mg, Oral, Daily   traMADol (ULTRAM) 50 mg, Oral, Every 6 hours PRN   Vitamin D3 2,000 Units, Oral, Daily     VITALS:   Vitals:   03/18/21 1433  BP: 121/72  Pulse: 65  SpO2: 97%  Weight: 151 lb 12.8 oz (68.9 kg)  Height: 5\' 9"  (1.753 m)   No flowsheet data found.  HEENT:  Normocephalic, atraumatic. The mucous membranes are moist. The superficial temporal arteries are without ropiness or tenderness. Flat affect.  Cardiovascular: Regular rate and rhythm. Lungs: Clear to auscultation bilaterally. Neck: There are no carotid bruits noted bilaterally.  NEUROLOGICAL:  Orientation:   Montreal Cognitive Assessment  03/18/2021  Visuospatial/ Executive (0/5) 3  Naming (0/3) 3  Attention: Read list of digits (0/2) 2  Attention: Read list of letters (0/1) 1  Attention: Serial 7 subtraction starting at 100 (0/3) 3  Language: Repeat phrase (0/2) 1  Language : Fluency (0/1) 0  Abstraction (0/2) 0  Delayed Recall (0/5) 3  Orientation (0/6) 5  Total  21  Adjusted Score (based on education) 22   Alert and oriented to person, place and time. No aphasia or dysarthria. Fund of knowledge is appropriate. Recent memory impaired, but remote memory intact.  Attention and concentration are intact  Able to name objects and repeat phrases. Abstraction impaired, delayed recall reduced (3/5). Able to draw but tremor in the drawing was noted, especially on the clock.  Cranial nerves: There is good facial symmetry. Extraocular muscles are intact and visual fields are full to confrontational testing. Speech is cautious, slow and soft, but and clear. Soft palate rises symmetrically and there is no tongue deviation or tremor. Hearing is decreased R>L to conversational tone. Tone: Tone is good throughout. Sensation: Sensation is intact to light touch and pinprick throughout. Vibration is intact  at the bilateral big toe.There is no extinction with double simultaneous stimulation. There is no sensory dermatomal level identified. Coordination: The patient has  difficulty with RAM's and  FNF bilaterally, movements are sower. Normal finger to nose but tremor noted at rest and on intention Motor: Strength is 5/5 in the bilateral upper and lower extremities. There is no pronator drift. There are no fasciculations noted. Cogwheel noted L>R  DTR's: Deep tendon reflexes are 2/4 at the bilateral biceps, triceps, brachioradialis, patella and achilles.  Plantar responses are downgoing bilaterally. Gait and Station: The patient is able to ambulate without difficulty.The patient is able to heel toe walk without any difficulty.The patient is able to ambulate, R arm with decreased swinging, L normal.  The patient is able to stand in the Romberg position. He did not respond to "push",, unable to maintain his balance. Gait is narrow but cautious.      Total time spent on today's visit was  60 minutes, including both face-to-face time and nonface-to-face time.  Time included that spent on review of records (prior notes available to me/labs/imaging if pertinent), discussing treatment and goals, answering patient's questions and coordinating care.  Cc:  Nicoletta Dress, MD  Sharene Butters 03/18/2021 7:35 PM

## 2021-03-18 ENCOUNTER — Ambulatory Visit (INDEPENDENT_AMBULATORY_CARE_PROVIDER_SITE_OTHER): Payer: Medicare PPO | Admitting: Physician Assistant

## 2021-03-18 ENCOUNTER — Other Ambulatory Visit: Payer: Self-pay

## 2021-03-18 ENCOUNTER — Encounter: Payer: Self-pay | Admitting: Physician Assistant

## 2021-03-18 VITALS — BP 121/72 | HR 65 | Ht 69.0 in | Wt 151.8 lb

## 2021-03-18 DIAGNOSIS — R251 Tremor, unspecified: Secondary | ICD-10-CM | POA: Diagnosis not present

## 2021-03-18 DIAGNOSIS — R413 Other amnesia: Secondary | ICD-10-CM | POA: Diagnosis not present

## 2021-03-18 MED ORDER — CARBIDOPA-LEVODOPA 25-100 MG PO TABS: ORAL_TABLET | ORAL | 6 refills | Status: DC

## 2021-03-18 NOTE — Patient Instructions (Addendum)
It was a pleasure to see you today at our office.   Recommendations:  Neurocognitive evaluation at our office MRI brain  1. Start carbidopa (Sinemet) 25/100mg  tablets.  Week 1: Take 0.5 tablet in morning and 0.5 tablet in evening/bedtime.  Week 2: Take 0.5 tablet in morning, 0.5 tablet in afternoon, and 0.5 tablet in evening/bedtime.  Week 3 & thereafter: Take 1 tablet three times daily.   Take the medication at the same time everyday. The medication does not get absorbed into your body as well, if you take it with protein-containing foods (meat, dairy, beans). Try taking the medication about one hour before meals. If you experience nausea by taking it on an empty stomach, you may take it with carbohydrate-containing food,such as bread or crackers. Side effects to look out for, include dizziness, nausea, vivid dreams and hallucinations. If you experience any of these symptoms, please call us. Follow up once the results of the above are available   RECOMMENDATIONS FOR ALL PATIENTS WITH MEMORY PROBLEMS: 1. Continue to exercise (Recommend 30 minutes of walking everyday, or 3 hours every week) 2. Increase social interactions - continue going to [place] and enjoy social gatherings with friends and family 3. Eat healthy, avoid fried foods and eat more fruits and vegetables 4. Maintain adequate blood pressure, blood sugar, and blood cholesterol level. Reducing the risk of stroke and cardiovascular disease also helps promoting better memory. 5. Avoid stressful situations. Live a simple life and avoid aggravations. Organize your time and prepare for the next day in anticipation. 6. Sleep well, avoid any interruptions of sleep and avoid any distractions in the bedroom that may interfere with adequate sleep quality 7. Avoid sugar, avoid sweets as there is a strong link between excessive sugar intake, diabetes, and cognitive impairment We discussed the Mediterranean diet, which has been shown to help  patients reduce the risk of progressive memory disorders and reduces cardiovascular risk. This includes eating fish, eat fruits and green leafy vegetables, nuts like almonds and hazelnuts, walnuts, and also use olive oil. Avoid fast foods and fried foods as much as possible. Avoid sweets and sugar as sugar use has been linked to worsening of memory function.  There is always a concern of gradual progression of memory problems. If this is the case, then we may need to adjust level of care according to patient needs. Support, both to the patient and caregiver, should then be put into place.      You have been referred for a neuropsychological evaluation (i.e., evaluation of memory and thinking abilities). Please bring someone with you to this appointment if possible, as it is helpful for the doctor to hear from both you and another adult who knows you well. Please bring eyeglasses and hearing aids if you wear them.    The evaluation will take approximately 3 hours and has two parts:   The first part is a clinical interview with the neuropsychologist (Dr. Melvyn Novas or Dr. Nicole Kindred). During the interview, the neuropsychologist will speak with you and the individual you brought to the appointment.    The second part of the evaluation is testing with the doctor's technician Hinton Dyer or Maudie Mercury). During the testing, the technician will ask you to remember different types of material, solve problems, and answer some questionnaires. Your family member will not be present for this portion of the evaluation.   Please note: We must reserve several hours of the neuropsychologist's time and the psychometrician's time for your evaluation appointment. As such, there  is a No-Show fee of $100. If you are unable to attend any of your appointments, please contact our office as soon as possible to reschedule.    FALL PRECAUTIONS: Be cautious when walking. Scan the area for obstacles that may increase the risk of trips and falls.  When getting up in the mornings, sit up at the edge of the bed for a few minutes before getting out of bed. Consider elevating the bed at the head end to avoid drop of blood pressure when getting up. Walk always in a well-lit room (use night lights in the walls). Avoid area rugs or power cords from appliances in the middle of the walkways. Use a walker or a cane if necessary and consider physical therapy for balance exercise. Get your eyesight checked regularly.  FINANCIAL OVERSIGHT: Supervision, especially oversight when making financial decisions or transactions is also recommended.  HOME SAFETY: Consider the safety of the kitchen when operating appliances like stoves, microwave oven, and blender. Consider having supervision and share cooking responsibilities until no longer able to participate in those. Accidents with firearms and other hazards in the house should be identified and addressed as well.   ABILITY TO BE LEFT ALONE: If patient is unable to contact 911 operator, consider using LifeLine, or when the need is there, arrange for someone to stay with patients. Smoking is a fire hazard, consider supervision or cessation. Risk of wandering should be assessed by caregiver and if detected at any point, supervision and safe proof recommendations should be instituted.  MEDICATION SUPERVISION: Inability to self-administer medication needs to be constantly addressed. Implement a mechanism to ensure safe administration of the medications.   DRIVING: Regarding driving, in patients with progressive memory problems, driving will be impaired. We advise to have someone else do the driving if trouble finding directions or if minor accidents are reported. Independent driving assessment is available to determine safety of driving.   If you are interested in the driving assessment, you can contact the following:  [employer] in [place]  [place]  [place] [phone] or [phone]    [place] refers to food and lifestyle choices that are based on the traditions of countries located on the [employer]. This way of eating has been shown to help prevent certain conditions and improve outcomes for people who have chronic diseases, like kidney disease and heart disease. What are tips for following this plan? Lifestyle  Cook and eat meals together with your family, when possible. Drink enough fluid to keep your urine clear or pale yellow. Be physically active every day. This includes: Aerobic exercise like running or swimming. Leisure activities like gardening, walking, or housework. Get 7-8 hours of sleep each night. If recommended by your health care provider, drink red wine in moderation. This means 1 glass a day for nonpregnant women and 2 glasses a day for men. A glass of wine equals 5 oz (150 mL). Reading food labels  Check the serving size of packaged foods. For foods such as rice and pasta, the serving size refers to the amount of cooked product, not dry. Check the total fat in packaged foods. Avoid foods that have saturated fat or trans fats. Check the ingredients list for added sugars, such as corn syrup. Shopping  At the grocery store, buy most of your food from the areas near the walls of the store. This includes: Fresh fruits and vegetables (produce).  Grains, beans, nuts, and seeds. Some of these may be available in unpackaged forms or large amounts (in bulk). Fresh seafood. Poultry and eggs. Low-fat dairy products. Buy whole ingredients instead of prepackaged foods. Buy fresh fruits and vegetables in-season from local farmers markets. Buy frozen fruits and vegetables in resealable bags. If you do not have access to quality fresh seafood, buy precooked frozen shrimp or canned fish, such as tuna, salmon, or sardines. Buy  small amounts of raw or cooked vegetables, salads, or olives from the deli or salad bar at your store. Stock your pantry so you always have certain foods on hand, such as olive oil, canned tuna, canned tomatoes, rice, pasta, and beans. Cooking  Cook foods with extra-virgin olive oil instead of using butter or other vegetable oils. Have meat as a side dish, and have vegetables or grains as your main dish. This means having meat in small portions or adding small amounts of meat to foods like pasta or stew. Use beans or vegetables instead of meat in common dishes like chili or lasagna. Experiment with different cooking methods. Try roasting or broiling vegetables instead of steaming or sauteing them. Add frozen vegetables to soups, stews, pasta, or rice. Add nuts or seeds for added healthy fat at each meal. You can add these to yogurt, salads, or vegetable dishes. Marinate fish or vegetables using olive oil, lemon juice, garlic, and fresh herbs. Meal planning  Plan to eat 1 vegetarian meal one day each week. Try to work up to 2 vegetarian meals, if possible. Eat seafood 2 or more times a week. Have healthy snacks readily available, such as: Vegetable sticks with hummus. Greek yogurt. Fruit and nut trail mix. Eat balanced meals throughout the week. This includes: Fruit: 2-3 servings a day Vegetables: 4-5 servings a day Low-fat dairy: 2 servings a day Fish, poultry, or lean meat: 1 serving a day Beans and legumes: 2 or more servings a week Nuts and seeds: 1-2 servings a day Whole grains: 6-8 servings a day Extra-virgin olive oil: 3-4 servings a day Limit red meat and sweets to only a few servings a month What are my food choices? Mediterranean diet Recommended Grains: Whole-grain pasta. Brown rice. Bulgar wheat. Polenta. Couscous. Whole-wheat bread. [place]. Vegetables: Artichokes. Beets. Broccoli. Cabbage. Carrots. Eggplant. Green beans. Chard. Kale. Spinach. Onions. Leeks. Peas.  Squash. Tomatoes. Peppers. Radishes. Fruits: Apples. Apricots. Avocado. Berries. Bananas. Cherries. Dates. Figs. Grapes. Lemons. Melon. Oranges. Peaches. Plums. Pomegranate. Meats and other protein foods: Beans. Almonds. Sunflower seeds. Pine nuts. Peanuts. [place]. Salmon. Scallops. Shrimp. [place]. Tilapia. Clams. Oysters. Eggs. Dairy: Low-fat milk. Cheese. Greek yogurt. Beverages: Water. Red wine. Herbal tea. Fats and oils: Extra virgin olive oil. Avocado oil. Grape seed oil. Sweets and desserts: [place] yogurt with honey. Baked apples. Poached pears. Trail mix. Seasoning and other foods: Basil. Cilantro. Coriander. Cumin. Mint. Parsley. Sage. Rosemary. Tarragon. Garlic. Oregano. Thyme. Pepper. Balsalmic vinegar. Tahini. Hummus. Tomato sauce. Olives. Mushrooms. Limit these Grains: Prepackaged pasta or rice dishes. Prepackaged cereal with added sugar. Vegetables: Deep fried potatoes (french fries). Fruits: Fruit canned in syrup. Meats and other protein foods: Beef. Pork. Lamb. Poultry with skin. Hot dogs. [place] Salines. Dairy: Ice cream. Sour cream. Whole milk. Beverages: Juice. Sugar-sweetened soft drinks. Beer. Liquor and spirits. Fats and oils: Butter. Canola oil. Vegetable oil. Beef fat (tallow). Lard. Sweets and desserts: Cookies. Cakes. Pies. Candy. Seasoning and other foods: Mayonnaise. Premade sauces and marinades. The items listed may not be a complete list. Talk with your dietitian  about what dietary choices are right for you. Summary The Mediterranean diet includes both food and lifestyle choices. Eat a variety of fresh fruits and vegetables, beans, nuts, seeds, and whole grains. Limit the amount of red meat and sweets that you eat. Talk with your health care provider about whether it is safe for you to drink red wine in moderation. This means 1 glass a day for nonpregnant women and 2 glasses a day for men. A glass of wine equals 5 oz (150 mL). This information is not intended to replace advice  given to you by your health care provider. Make sure you discuss any questions you have with your health care provider. Document Released: 05/19/2016 Document Revised: 06/21/2016 Document Reviewed: 05/19/2016 Elsevier Interactive Patient Education  2017 [place]

## 2021-03-19 NOTE — Progress Notes (Signed)
Patient was seen, evaluated, and treatment plan was discussed with the Advanced Practice Provider.  I have also reviewed the orders written for this patient which were under my direction. I agree with the findings except where indicated and the plan of care as documented by the Advanced Practice Provider.   Patient exhibited some hypophonia.  Strength intact.  Very slight bilateral postural tremor in the hands but not at rest.  Mild cogwheel rigidity noted in wrists.  Mild increased tone noted in the elbows.  Mildly reduced finger-thumb tapping bilaterally.  Upward posture with slight reduced arm swing on right.  Slight shuffling of gait.  Positive retropulsion test.  Romberg negative.  Metta Clines, DO

## 2021-03-23 ENCOUNTER — Telehealth: Payer: Self-pay

## 2021-03-23 NOTE — Telephone Encounter (Signed)
Spoke with pt regarding sleep study. Pt was informed his appt is 05/26/21 at 8pm. Pt agreed and confirmed appt.

## 2021-03-26 NOTE — Telephone Encounter (Signed)
scheduled 05/26/21.

## 2021-03-26 NOTE — Telephone Encounter (Signed)
Patient is scheduled for lab study on 8/17 Patient understands his sleep study will be done at [facility] sleep lab. Patient understands he will receive a sleep packet in a week or so. Patient understands to call if he does not receive the sleep packet in a timely manner. Patient agrees with treatment and thanked me for call.

## 2021-03-30 ENCOUNTER — Telehealth: Payer: Self-pay | Admitting: Physician Assistant

## 2021-03-30 NOTE — Telephone Encounter (Signed)
Wife called in, would like a call back regarding her husbands visit. She wasn't able to come last week and she wants to talk with someone.

## 2021-03-31 NOTE — Telephone Encounter (Signed)
The wife was asleep, husband will have her call back if still needed.

## 2021-03-31 NOTE — Telephone Encounter (Signed)
Pt's wife called in returning Christy's call

## 2021-03-31 NOTE — Telephone Encounter (Signed)
Left message to call back  

## 2021-04-14 ENCOUNTER — Other Ambulatory Visit: Payer: Self-pay

## 2021-04-14 MED ORDER — HYDROCHLOROTHIAZIDE 12.5 MG PO CAPS: ORAL_CAPSULE | ORAL | 3 refills | Status: DC

## 2021-04-19 ENCOUNTER — Emergency Department (HOSPITAL_COMMUNITY)
Admission: EM | Admit: 2021-04-19 | Discharge: 2021-04-20 | Disposition: A | Discharge status: Home / Self Care | Payer: Medicare PPO | Attending: Emergency Medicine | Admitting: Emergency Medicine

## 2021-04-19 DIAGNOSIS — R079 Chest pain, unspecified: Secondary | ICD-10-CM | POA: Insufficient documentation

## 2021-04-19 DIAGNOSIS — Z5321 Procedure and treatment not carried out due to patient leaving prior to being seen by health care provider: Secondary | ICD-10-CM | POA: Diagnosis not present

## 2021-04-19 DIAGNOSIS — I2510 Atherosclerotic heart disease of native coronary artery without angina pectoris: Secondary | ICD-10-CM | POA: Diagnosis not present

## 2021-04-19 DIAGNOSIS — R0789 Other chest pain: Secondary | ICD-10-CM | POA: Diagnosis not present

## 2021-04-19 DIAGNOSIS — I10 Essential (primary) hypertension: Secondary | ICD-10-CM | POA: Insufficient documentation

## 2021-04-19 DIAGNOSIS — Z951 Presence of aortocoronary bypass graft: Secondary | ICD-10-CM | POA: Diagnosis not present

## 2021-04-19 DIAGNOSIS — R202 Paresthesia of skin: Secondary | ICD-10-CM | POA: Diagnosis not present

## 2021-04-19 DIAGNOSIS — R0902 Hypoxemia: Secondary | ICD-10-CM | POA: Diagnosis not present

## 2021-04-19 DIAGNOSIS — R42 Dizziness and giddiness: Secondary | ICD-10-CM | POA: Diagnosis not present

## 2021-04-19 NOTE — ED Triage Notes (Signed)
Pt c/o chest tingling, numbness in R leg approx 2200, since resolved. Wife states pt has had "several falls recently, have been getting worse, not seen for." Hx MI, CVA, bypass surgery, prompted concern & eval

## 2021-04-19 NOTE — ED Triage Notes (Signed)
Pt compliant w AC medication- Plavix

## 2021-04-19 NOTE — ED Provider Notes (Signed)
Emergency Medicine Provider Triage Evaluation Note  Matthew Liu , a 80 y.o. male  was evaluated in triage.  Pt complains of chest tingling and R leg numbness onset 2200 after working outside today in the yard. Symptoms have spontaneously resolved. Compiant with medications. Wife reports increased falls and balance issues lately. Concern for developing dementia, per EMS. Hx of CAD s/p CABG, HTN, CVA. On Plavix. No medications PTA.  Review of Systems  Positive: Chest pain, R leg numbness Negative: Back pain, abdominal pain, fever  Physical Exam  There were no vitals taken for this visit. Gen:   Awake, no distress   Resp:  Normal effort  MSK:   Moves extremities without difficulty  Other:  Speech is clear, goal oriented. Symmetric strength and sensation in BUE and BLE. Hard of hearing.  Medical Decision Making  Medically screening exam initiated at 11:40 PM.  Appropriate orders placed.  CASSELL VOORHIES was informed that the remainder of the evaluation will be completed by another provider, this initial triage assessment does not replace that evaluation, and the importance of remaining in the ED until their evaluation is complete.  Chest pain   Antonietta Breach, PA-C 04/19/21 [id number]    Merryl Hacker, MD 04/20/21 [id number]

## 2021-04-20 ENCOUNTER — Telehealth: Payer: Self-pay | Admitting: Interventional Cardiology

## 2021-04-20 ENCOUNTER — Emergency Department (HOSPITAL_COMMUNITY): Payer: Medicare PPO

## 2021-04-20 DIAGNOSIS — R079 Chest pain, unspecified: Secondary | ICD-10-CM | POA: Diagnosis not present

## 2021-04-20 LAB — CBC WITH DIFFERENTIAL/PLATELET
Abs Immature Granulocytes: 0.02 10*3/uL (ref 0.00–0.07)
Basophils Absolute: 0 10*3/uL (ref 0.0–0.1)
Basophils Relative: 0 %
Eosinophils Absolute: 0.1 10*3/uL (ref 0.0–0.5)
Eosinophils Relative: 2 %
HCT: 35.8 % — ABNORMAL LOW (ref 39.0–52.0)
Hemoglobin: 12.2 g/dL — ABNORMAL LOW (ref 13.0–17.0)
Immature Granulocytes: 0 %
Lymphocytes Relative: 20 %
Lymphs Abs: 1.3 10*3/uL (ref 0.7–4.0)
MCH: 34.5 pg — ABNORMAL HIGH (ref 26.0–34.0)
MCHC: 34.1 g/dL (ref 30.0–36.0)
MCV: 101.1 fL — ABNORMAL HIGH (ref 80.0–100.0)
Monocytes Absolute: 0.8 10*3/uL (ref 0.1–1.0)
Monocytes Relative: 13 %
Neutro Abs: 4.2 10*3/uL (ref 1.7–7.7)
Neutrophils Relative %: 65 %
Platelets: 186 10*3/uL (ref 150–400)
RBC: 3.54 MIL/uL — ABNORMAL LOW (ref 4.22–5.81)
RDW: 13.2 % (ref 11.5–15.5)
WBC: 6.5 10*3/uL (ref 4.0–10.5)
nRBC: 0 % (ref 0.0–0.2)

## 2021-04-20 LAB — BASIC METABOLIC PANEL
Anion gap: 9 (ref 5–15)
BUN: 26 mg/dL — ABNORMAL HIGH (ref 8–23)
CO2: 25 mmol/L (ref 22–32)
Calcium: 9.4 mg/dL (ref 8.9–10.3)
Chloride: 101 mmol/L (ref 98–111)
Creatinine, Ser: 1.29 mg/dL — ABNORMAL HIGH (ref 0.61–1.24)
GFR, Estimated: 56 mL/min — ABNORMAL LOW (ref >60)
Glucose, Bld: 122 mg/dL — ABNORMAL HIGH (ref 70–99)
Potassium: 3.8 mmol/L (ref 3.5–5.1)
Sodium: 135 mmol/L (ref 135–145)

## 2021-04-20 LAB — TROPONIN I (HIGH SENSITIVITY)
Troponin I (High Sensitivity): 17 ng/L (ref <18)
Troponin I (High Sensitivity): 18 ng/L — ABNORMAL HIGH (ref <18)

## 2021-04-20 NOTE — Telephone Encounter (Signed)
If the the right foot is discolored, ie pale, ashen, or blue, he needs vascular study because he has PAD.  If numbness related to PAD, walking will make it worse.  If PAD, the foot will be cool or cold.  If none of above, likely neurogenic from L5 S1 Lumbar disk or soe other level..  Can't really tell over the phone. Needs to be seen. Probably by PCP.

## 2021-04-20 NOTE — Telephone Encounter (Signed)
Spoke with wife and she asked pt about foot.  Pt denies any discoloration to foot/ankle and states it is warm to touch.  Advised to reach out to PCP or his back doctor (had recent back issues) for work up of numbness.  Wife verbalized understanding and was appreciative for call.

## 2021-04-20 NOTE — ED Notes (Signed)
Patient wife very upset over wait time. States "well I guess next time ll just let him have a heart attack maybe then he can get help." explained wait time ad acuity to wife.

## 2021-04-20 NOTE — ED Notes (Signed)
Patients wife states they will go to another hospital. EMS IV taken out by this NT

## 2021-04-20 NOTE — Telephone Encounter (Signed)
Patient's wife is calling to say that her husband has numbness in his right leg and foot. They had to go to the ER last night because of it. Wasn't able to be seen because it was taking forever. Please advise

## 2021-04-20 NOTE — Telephone Encounter (Signed)
Wife called and states pt went to ER yesterday due to numbness of right leg/foot.  Resolved while he was there and he left.  Wife states he mowed the yard yesterday on a riding mower.  Still having slight numbness/tingling in foot today but able to walk around fine.  Denies any pain.  Advised I will send to Dr. Tamala Julian for review.  Should we do a LE arterial doppler?

## 2021-05-26 ENCOUNTER — Ambulatory Visit (HOSPITAL_BASED_OUTPATIENT_CLINIC_OR_DEPARTMENT_OTHER): Payer: Medicare PPO | Admitting: Cardiology

## 2021-06-04 ENCOUNTER — Ambulatory Visit (HOSPITAL_BASED_OUTPATIENT_CLINIC_OR_DEPARTMENT_OTHER): Payer: Medicare PPO | Attending: Interventional Cardiology | Admitting: Cardiology

## 2021-06-04 ENCOUNTER — Other Ambulatory Visit: Payer: Self-pay

## 2021-06-04 DIAGNOSIS — G4733 Obstructive sleep apnea (adult) (pediatric): Secondary | ICD-10-CM | POA: Diagnosis not present

## 2021-06-04 DIAGNOSIS — G4719 Other hypersomnia: Secondary | ICD-10-CM

## 2021-06-07 ENCOUNTER — Other Ambulatory Visit: Payer: Self-pay

## 2021-06-15 NOTE — Procedures (Signed)
    Patient Name: Matthew Liu, Matthew Liu Date:06/04/2021 Gender: Male D.O.B: 09/06/1941 Age (years): 41 Referring Provider: Daneen Schick Height (inches): 69 Interpreting Physician: Fransico Him MD, ABSM Weight (lbs): 152 RPSGT: Jorge Ny BMI: 22 MRN: [id number] Neck Size: 15.50  CLINICAL INFORMATION Sleep Study Type: NPSG  Indication for sleep study: Hypertension, Memory Loss, Witnesses Apnea / Gasping During Sleep  Epworth Sleepiness Score: 2  SLEEP STUDY TECHNIQUE As per the AASM Manual for the Scoring of Sleep and Associated Events v2.3 (April 2016) with a hypopnea requiring 4% desaturations.  The channels recorded and monitored were frontal, central and occipital EEG, electrooculogram (EOG), submentalis EMG (chin), nasal and oral airflow, thoracic and abdominal wall motion, anterior tibialis EMG, snore microphone, electrocardiogram, and pulse oximetry.  MEDICATIONS Medications self-administered by patient taken the night of the study : ATORVASTATIN, LISINOPRIL, METOPROLOL  SLEEP ARCHITECTURE The study was initiated at 10:56:26 PM and ended at 5:03:12 AM.  Sleep onset time was 27.9 minutes and the sleep efficiency was 30.8%. The total sleep time was 113 minutes.  Stage REM latency was N/A minutes.  The patient spent 44.% of the night in stage N1 sleep, 55.3% in stage N2 sleep, 0.0% in stage N3 and 0% in REM.  Alpha intrusion was absent.  Supine sleep was 35.84%.  RESPIRATORY PARAMETERS The overall apnea/hypopnea index (AHI) was 40.4 per hour. There were 6 total apneas, including 6 obstructive, 0 central and 0 mixed apneas. There were 70 hypopneas and 16 RERAs.  The AHI during Stage REM sleep was N/A per hour.  AHI while supine was 75.6 per hour.  The mean oxygen saturation was 94.5%. The minimum SpO2 during sleep was 84.0%.  moderate snoring was noted during this study.  CARDIAC DATA The 2 lead EKG demonstrated sinus rhythm. The mean heart rate was 46.1  beats per minute. Other EKG findings include: PVCs.  LEG MOVEMENT DATA The total PLMS were 0 with a resulting PLMS index of 0.0. Associated arousal with leg movement index was 0.0 .  IMPRESSIONS - Severe obstructive sleep apnea occurred during this study (AHI = 40.4/h). - Mild oxygen desaturation was noted during this study (Min O2 = 84.0%). - The patient snored with moderate snoring volume. - EKG findings include PVCs. - Clinically significant periodic limb movements did not occur during sleep. No significant associated arousals.  DIAGNOSIS - Obstructive Sleep Apnea (G47.33)  RECOMMENDATIONS - Therapeutic CPAP titration to determine optimal pressure required to alleviate sleep disordered breathing. - Positional therapy avoiding supine position during sleep. - Avoid alcohol, sedatives and other CNS depressants that may worsen sleep apnea and disrupt normal sleep architecture. - Sleep hygiene should be reviewed to assess factors that may improve sleep quality. - Weight management and regular exercise should be initiated or continued if appropriate.  [Electronically signed] 06/15/2021 03:18 PM  Fransico Him MD, ABSM Diplomate, American Board of Sleep Medicine

## 2021-06-19 ENCOUNTER — Telehealth: Payer: Self-pay | Admitting: *Deleted

## 2021-06-19 DIAGNOSIS — G4733 Obstructive sleep apnea (adult) (pediatric): Secondary | ICD-10-CM

## 2021-06-19 NOTE — Telephone Encounter (Signed)
The patient has been notified of the result and verbalized understanding.    Marolyn Hammock, [place] 06/19/2021 11:03 PM  Titration to be precerted

## 2021-06-19 NOTE — Telephone Encounter (Signed)
-----   Message from Sueanne Margarita, MD sent at 06/15/2021  3:19 PM EDT ----- Please let patient know that they have sleep apnea.  Recommend therapeutic CPAP titration for treatment of patient's sleep disordered breathing.  If unable to perform an in lab titration then initiate ResMed auto CPAP from 4 to 15cm H2O with heated humidity and mask of choice and overnight pulse ox on CPAP.

## 2021-07-02 ENCOUNTER — Telehealth: Payer: Self-pay | Admitting: Interventional Cardiology

## 2021-07-02 NOTE — Telephone Encounter (Signed)
Wife of patient is concerned because the patient sleeps a lot. He only gets up to eat, and that is  about two meals a day , and they are usually small meals. The wife is concerned because he down to 144 pounds.   She is just concerned and is not sure what to do

## 2021-07-02 NOTE — Telephone Encounter (Signed)
Called back and spoke with the pt and he reports that he has not had an appetite for several months and he is very sleepy all of the timer.. his BP stays around 106/62 and HR 58.   He denies peripheral edema, no chest pain, no SOB... just says he is "sleepy".   I have asked him to talk with his PCP. He says he has noy had a physical in many years.   He says he will call ana make an apt.   I have asked him to try and drink more fluids to help support his BP... I will also forward to Dr. Tamala Julian for review and any further recommendations.   Pt last see by [place] 02/2021 and next appt in one year.

## 2021-07-03 DIAGNOSIS — D649 Anemia, unspecified: Secondary | ICD-10-CM | POA: Diagnosis not present

## 2021-07-03 DIAGNOSIS — R630 Anorexia: Secondary | ICD-10-CM | POA: Diagnosis not present

## 2021-07-03 DIAGNOSIS — D539 Nutritional anemia, unspecified: Secondary | ICD-10-CM | POA: Diagnosis not present

## 2021-07-03 DIAGNOSIS — Z23 Encounter for immunization: Secondary | ICD-10-CM | POA: Diagnosis not present

## 2021-07-03 DIAGNOSIS — Z6822 Body mass index (BMI) 22.0-22.9, adult: Secondary | ICD-10-CM | POA: Diagnosis not present

## 2021-07-03 DIAGNOSIS — R634 Abnormal weight loss: Secondary | ICD-10-CM | POA: Diagnosis not present

## 2021-07-05 ENCOUNTER — Ambulatory Visit (INDEPENDENT_AMBULATORY_CARE_PROVIDER_SITE_OTHER): Payer: Medicare PPO | Admitting: Counselor

## 2021-07-05 ENCOUNTER — Encounter: Payer: Self-pay | Admitting: Counselor

## 2021-07-05 ENCOUNTER — Ambulatory Visit: Payer: Medicare PPO | Admitting: Psychology

## 2021-07-05 ENCOUNTER — Other Ambulatory Visit: Payer: Self-pay

## 2021-07-05 DIAGNOSIS — G20 Parkinson's disease: Secondary | ICD-10-CM

## 2021-07-05 DIAGNOSIS — G3184 Mild cognitive impairment, so stated: Secondary | ICD-10-CM | POA: Diagnosis not present

## 2021-07-05 DIAGNOSIS — F09 Unspecified mental disorder due to known physiological condition: Secondary | ICD-10-CM

## 2021-07-05 NOTE — Progress Notes (Signed)
[facility]  Patient Name: Matthew Liu MRN: [id number] Date of Birth: [birth year] Age: 80 y.o. Education: 12 years  Referral Circumstances and Background Information  Matthew Liu is a 80 y.o., right-hand dominant, married man with a history of problems with memory and thinking. 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 He was seen recently by Sharene Butters, PA-C who noted resting tremor, bilateral cogwheel rigidity and a MoCA of 22/30. He was referred for neuropsychological assessment in the service of diagnostic clarification.  On interview, the patient is quite hard of hearing and seemed to have a hard time providing much useful history. He and his wife were very difficult historians, had a hard time summarizing, and did not respond well to redirection so the history is limited. I asked if he is concerned about his memory and thinking and he said that he is concerned, maybe, because he cannot tell me what year the magna carta was signed or the year that [place] discovered [place]. He does have some family history of AD, his father developed the condition in his 79s. His wife notices that he drives past turns more than in the past and also misplaces things. She is also concerned that he sleeps a lot. She said that she has only been concerned about memory and thinking for several months. He lost his bank card twice. On specific review of symptoms, she is denying that he forgets things that she tells him, that he repeats himself, or that he has problems recalling names. They were not clear if he is having problems with decision making and problem solving, although it seems like he is having a rather hard time answering questions on interview today. With respect to mood, the patient stated that he feels "pretty good." He does admit that his energy is low  and he doesn't have much stamina. His wife described him as previously very active, walking 5 miles a day, every day. He stopped 5 years ago after he had his heart attacks. He sleeps well at night, and in fact excessively according to his wife. They were vague about whether he is having any dream enactment behavior, he did "sling his legs" last night but they are saying that it was to move the covers. His appetite is low and he doesn't eat as much as he did in the past. Somewhat positively, he has been taking the sinemet and it is helpful (he thinks it has helped his writing and movement).   With respect to functioning, it sounds like the patient has questionable financial judgment. They inherited a significant amount of money (several hundred thousand dollars) and he gave it to their children without consulting his wife. He also did that in the past though, so that may not be a change for him. Their grand daughter is managing the finances over the past few months because he was having a hard time with handwriting but they are denying that he had any problems because of memory and thinking problems. They said he was doing the finances independently just several months prior. He is still driving. Apparently he was walking downtown and was gone for several hours a few months back, his wife presented as though she thought he was lost but he is denying that. He doesn't use a computer or a smart phone and never has. He used to read all the time and  does not now, but he thinks he still could. He stated that he is managing his own medications and does not need help. He was able to tell me what they are in reasonable basic terms. They did get a pill organizer so he can monitor them. He does things around the house, although he is limited in what he can do. He says he spends most of his time outside or riding his mower, although his wife is saying he will often sleep many hours during the day. He will sleep until 12pm and  then go back to sleep in the afternoon after eating. His wife is saying this started around the time he had a heart attack several years ago although they also presented like it is a new problem so it's not clear.   Past Medical History and Review of Relevant Studies   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   Coronary artery disease involving native coronary artery of native heart with unstable angina pectoris ([place]) 12/11/2018   Unstable angina (HCC)    Lumbar compression fracture ([place]) 01/09/2018   Lacunar stroke ([place]) 10/13/2015   PAD (peripheral artery disease) ([place]) 10/13/2015   Acute ischemic stroke ([place]) 09/15/2015   Ankle fracture, left    OSA (obstructive sleep apnea)    Erectile dysfunction 12/01/2014   Aspirin allergy 07/16/2013   Dyslipidemia 07/16/2013   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 There is a minimal burden of leukoaraiosis. There is no more recent neuroimaging.   Current Outpatient Medications  Medication Sig Dispense Refill   acetaminophen (TYLENOL) 500 MG tablet Take 1 tablet (500 mg total) by mouth every 6 (six) hours as needed for mild pain or headache. 30 tablet 0   ALPRAZolam (XANAX) 0.25 MG tablet Take 0.25 mg by mouth 2 (two) times daily as needed for anxiety.      atorvastatin (LIPITOR) 80 MG tablet Take 1 tablet (80 mg total) by mouth every evening. 90 tablet 3   carbidopa-levodopa (SINEMET IR) 25-100 MG tablet Week 1: Take 0.5 tablet in morning and 0.5 tablet in evening/bedtime. Week 2: Take 0.5 tablet in morning, 0.5 tablet in  afternoon, and 0.5 tablet in evening/bedtime. Week 3 & thereafter: Take 1 tablet three times daily 60 tablet 6   Cholecalciferol (VITAMIN D3) 50 MCG (2000 UT) capsule Take 2,000 Units by mouth daily.     clopidogrel (PLAVIX) 75 MG tablet TAKE 1 TABLET BY MOUTH EVERY DAY 90 tablet 2   hydrochlorothiazide (MICROZIDE) 12.5 MG capsule TAKE 1 CAPSULE BY MOUTH EVERY DAY 90 capsule 3   lisinopril (ZESTRIL) 40 MG tablet Take 20 mg by mouth at bedtime.      metoprolol tartrate (LOPRESSOR) 25 MG tablet Take 0.5 tablets (12.5 mg total) by mouth 2 (two) times daily. 90 tablet 3   Multiple Vitamins-Minerals (ONE-A-DAY MENS 50+) TABS Take 1 tablet by mouth daily with breakfast.     pantoprazole (PROTONIX) 40 MG tablet Take 1 tablet (40 mg total) by mouth daily. 90 tablet 3   traMADol (ULTRAM) 50 MG tablet Take 1 tablet (50 mg  total) by mouth every 6 (six) hours as needed for severe pain. 28 tablet 0   No current facility-administered medications for this visit.   Family History  Problem Relation Age of Onset   Hypertension Mother    Ovarian cancer Mother    Heart failure Father    Stroke Neg Hx    There is no  family history of dementia. There is no  family history of psychiatric illness.  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 He stated that it was because he "didn't take school seriously." He denied ever having specific learning problems. He turned his studies around and started doing better after that. He worked a year and then joined the [employer]. He served 4 years and denied being involved in any combat. 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   Psychiatric History: The patient denied any psychiatric history. He says that he doesn't think he gets depressed but, "My  doctor does think I'm depressed and gives me something for it." I'm not sure what that is because I do not see any antidepressants. He was vague about how often he takes the alprazolam.   Substance Use History: The patient used to enjoy drinking beer, although they are denying that he was a very heavy drinker. He used to have many 2 or 3 a day. He said he only drinks infrequently when mowing the lawn, "I got a 12 pack a couple years ago and finally drank the last one yesterday." He doesn't use nicotine.   Relationship History and Living Cimcumstances: The patient and his wife have been married for 37 years. They have two children and two grandchildren.   Mental Status and Behavioral Observations  Sensorium/Arousal: The patient's level of arousal was awake and alert. The patient is very hard of hearing. I used a pocket audio amplification device with him, which seemed to help. He still would take a while to respoind but I think this may have been centrally mediated.  Orientation: The patient was alert and oriented to person, place, time, and situation.  Appearance: Dressed in appropriate, casual clothing Behavior: The patient had a very hard time summarizing and answering questions directly, as did his wife, limiting the quality of the history.  Speech/language: Normal in rate, rhythm, volume. Patient himself thinks he has noticed some mild hypophonia Gait/Posture: Reasonable stride length, arm swing difficult to assess because his hands were full.  Movement: He did appear to have a right rest tremor Social Comportment: Appropriate within social norms Mood: "Good" Affect: Mainly neutral Thought process/content: Thought process was meandering, circumstantial, and the patient required a great deal of redirection to provide meaningful information. Thought content was variable, often nonpertinent.  Safety: No thoughts of harming self or others Insight: Unclear  Test Procedures  Wide Range  Achievement Test - 4             Word Reading Neuropsychological Assessment Battery  List Learning  Story Learning  Daily Living Memory  Naming  Digit Span Repeatable Battery for the Assessment of Neuropsychological Status (Form A)  Figure Copy  Judgment of Line Orientation  Coding  Figure Recall The Dot Counting Test A Random Letter Test Controlled Oral Word Association (F-A-S) Semantic Fluency (Animals) Trail Making Test A & B  Complex Ideational Material Modified [place] Card Sorting Test Geriatric Depression Scale - Short Form Quick Dementia Rating System (Given to patient's wife, she was too confused by it to complete it)  Plan  Matthew Liu was seen for a psychiatric diagnostic evaluation and neuropsychological testing. He is an 80 year old, right-hand dominant, married man with a history of Parkinsonism, multiple risk factors, and cognitive decline over about the past three months as per him and his wife. Unfortunately, the patient and his wife had a hard time providing meaningful history and therefore specific details are limited. It sounds like they have noticed some absentmindedness such as driving past turns, losing things/misplacing things, but he is not getting lost and he does not sound impaired in any area. Of note, his activity level has been very low over the past several years, since he had a heart attack and stents. He is also quite fatigued. Full and complete note with impressions, recommendations, and interpretation of test data to follow.  Matthew Nicole Kindred, PsyD, ABN Clinical Neuropsychologist  Informed Consent2  Risks and benefits of the evaluation were discussed with the patient prior to all testing procedures. I conducted a clinical interview and neuropsychological testing (at least two tests) with Matthew Liu,  a. is likely to benefit from further follow up to receive the diagnosis and treatment recommendations, which will be rendered at the next  encounter.

## 2021-07-05 NOTE — Progress Notes (Signed)
   Psychometrist Note   Cognitive testing was administered to Matthew Liu by Milana Kidney, B.S. (Technician) under the supervision of Alphonzo Severance, Psy.D., ABN. Matthew Liu was able to tolerate all test procedures. Dr. Nicole Kindred met with the patient as needed to manage any emotional reactions to the testing procedures. Rest breaks were offered.    The battery of tests administered was selected by Dr. Nicole Kindred with consideration to the patient's current level of functioning, the nature of his symptoms, emotional and behavioral responses during the interview, level of literacy, observed level of motivation/effort, and the nature of the referral question. This battery was communicated to the psychometrist. Communication between Dr. Nicole Kindred and the psychometrist was ongoing throughout the evaluation and Dr. Nicole Kindred was immediately accessible at all times. Dr. Nicole Kindred provided supervision to the technician on the date of this service, to the extent necessary to assure the quality of all services provided.    Matthew Liu will return in approximately one week for an interactive feedback session with Dr. Nicole Kindred, at which time test performance, clinical impressions, and treatment recommendations will be reviewed in detail. The patient understands he can contact our office should he require our assistance before this time.   A total of 115 minutes of billable time were spent with Matthew Liu by the technician, including test administration and scoring time. Billing for these services is reflected in Dr. Les Pou note.   This note reflects time spent with the psychometrician and does not include test scores, clinical history, or any interpretations made by Dr. Nicole Kindred. The full report will follow in a separate note.

## 2021-07-06 NOTE — Progress Notes (Signed)
[facility]  Patient Name: Matthew Liu MRN: [id number] Date of Birth: 1941-10-03 Age: 80 y.o. Education: 12 years  Measurement properties of test scores: IQ, Index, and Standard Scores (SS): Mean = 100; Standard Deviation = 15 Scaled Scores (Ss): Mean = 10; Standard Deviation = 3 Z scores (Z): Mean = 0; Standard Deviation = 1 T scores (T); Mean = 50; Standard Deviation = 10  TEST SCORES:    Note: This summary of test scores accompanies the interpretive report and should not be interpreted by unqualified individuals or in isolation without reference to the report. Test scores are relative to age, gender, and educational history as available and appropriate.   Performance Validity        "A" Random Letter Test Raw  Descriptor      Errors 0 Within Expectation  The Dot Counting Test: 11 Within Expectation  NAB Effort Index 3 Below Expectation      Mental Status Screening     Total Score Descriptor  MMSE 29 Normal      Expected Functioning        Wide Range Achievement Test: Standard/Scaled Score Percentile      Word Reading 107 68      Attention/Processing Speed        Neuropsychological Assessment Battery (Attention Module, Form 1): T-score Percentile      Digits Forward 47 38      Digits Backwards 47 38      Repeatable Battery for the Assessment of Neuropsychological Status (Form A): Scaled Score Percentile      Coding 5 5      Language        Neuropsychological Assessment Battery (Language Module, Form 1): T-score Percentile      Naming   (27) 43 25      Verbal Fluency: T-score Percentile      Controlled Oral Word Association (F-A-S) 39 14      Semantic Fluency (Animals) 45 31      Memory:        Neuropsychological Assessment Battery (Memory Module, Form 1): T-score Percentile      List Learning           List A Immediate Recall   (4, 4, 4) 40 16         List B Immediate Recall   (2) 44 27         List A Short Delayed  Recall   (4) 45 31         List A Long Delayed Recall   (3) 42 21         List A Percent Retention   (75 %) --- 42         List A Long Delayed Yes/No Recognition Hits   (7) --- 1         List A Long Delayed Yes/No Recognition False Alarms   (8) --- 25         List A Recognition Discriminability Index --- 4      Story Learning           Immediate Recall   (12, 18) 34 5         Delayed Recall   (18) 40 16         Percent Retention   (100 %) --- 75      Daily Living Memory            Immediate Recall   (  18, 19) 50 50          Delayed Recall   (7, 7) 56 73          Percent Retention (88 %) --- 66          Recognition Hits    (10) --- 91      Repeatable Battery for the Assessment of Neuropsychological Status (Form A): Scaled Score Percentile         Figure Recall   (13) 11 63      Visuospatial/Constructional Functioning        Repeatable Battery for the Assessment of Neuropsychological Status (Form A): Standard/Scaled Score Percentile     Visuospatial/Constructional Index 109 73         Figure Copy   (20) 14 91         Judgment of Line Orientation   (15) --- 26-50      Executive Functioning        [employer] Test (MWCST): Standard/T-Score Percentile      Number of Categories Correct 44 27      Number of Perseverative Errors 49 46      Number of Total Errors 48 42      Percent Perseverative Errors 54 66  Executive Function Composite 94 34      Trail Making Test: T-Score Percentile      Part A 36 8      Part B 43 25      [place] Diagnostic Aphasia Exam: Raw Score Scaled Score      Complex Ideational Material 10 7      Rating Scales             Raw Score Descriptor  Geriatric Depression Scale - Short Form 5 Negative   Jabe Jeanbaptiste V. Nicole Kindred PsyD, [place] Clinical Neuropsychologist

## 2021-07-06 NOTE — Telephone Encounter (Signed)
Patient's wife is requesting to go over his sleep study results again.

## 2021-07-07 NOTE — Telephone Encounter (Signed)
Patient is following up, requesting to go over the recommendation again.

## 2021-07-07 NOTE — Telephone Encounter (Signed)
I spoke with patient. He has questions regarding his sleep study.  I told him I would send message to the person who arranges sleep studies/follow up and he would be called back

## 2021-07-08 NOTE — Telephone Encounter (Signed)
The patient has been notified of the result and verbalized understanding.  All questions (if any) were answered. Marolyn Hammock, [place] 07/08/2021 12:39 PM   Patient/wife is agreeable to treatment.

## 2021-07-08 NOTE — Progress Notes (Signed)
[facility]  Patient Name: Matthew Liu MRN: [id number] Date of Birth: 1940/12/12 Age: 80 y.o. 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 He and his wife were very difficult historians but did not spontaneously report many symptoms. His wife has noticed that he will drive past turns (on occasion), that he misplaces things, and he also seems to sleep a lot. He has a history of poor/questionable financial judgment going back several decades. His current behavioral repertoire is restricted and he is not doing much.   On neuropsychological testing, [name] performance was essentially normal with an isolated area of low performance showing on measures of processing speed. Given that these measures do not have significant hearing demands, I think that they are likely picking up on actual changes in psychomotor abilities. There were also a few weak memory scores (mainly encoding issues), but there is no suggestion of a storage problem. He has no findings concerning for the so-called 'posterior cortical' or limbic dysfunction sometimes seen in later stages of PD, such as anomia, visuospatial, or constructional impairment (these findings often predict and precede dementia in Parkinson's disease). He screened negative for the presence of depression and his wife could not fill out a QDRS, because she was confused about the form. I do not feel I can rate a CDR given the quality of the history.   [name] is thus demonstrating a mild cognitive impairment level problem with primary processing speed deficits. If it is felt that he meets criteria for Parkinson's disease, then these findings could certainly  be explained by that diagnosis. There may also be a vascular component given risk factors (he has an MRI ordered). Will recommend that he increase his activity level, exercise will be an important part of his therapy if he does in fact have Parkinsons. He is already on sinemet, which he feels is somewhat helpful. He may benefit from a visit with audiology, given that uncorrected hearing loss is a risk-factor for dementia and is also problematic of its own accord (and he should wear the hearing aids that he has). He can return for neuropsychological evaluation as needed, I do not think he needs to come back yearly unless clinically indicated.   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 These types of deficits support a diagnosis of mild neurocognitive disorder.  The major difference between mild cognitive impairment (MCI) and dementia is in severity and potential prognosis. Once someone reaches a level of severity adequate to be diagnosed with a dementia, there is usually progression over time, though this may be years. On the other hand, mild cognitive impairment, while a significant risk for dementia in future, does not always progress to dementia, and in some instances stays the same or can even revert to normal. It is important to realize that if MCI is due to underlying Alzheimer's disease, it will most likely progress to dementia eventually. The rate of conversion to Alzheimer's dementia from amnestic MCI is about 15% per year versus the general  population risk of conversion of 2% per year.   In your case, you had findings of parkinsonism on exam and I believe the working neurological diagnosis is Parkinson's disease.  Parkinson's disease is frequently associated with the type of cognitive difficulties you demonstrated on testing, and 20% or more of patients with Parkinson's will have mild cognitive impairment at the time of diagnosis. I think this is the most likely cause of your difficulties.  You are already seeing Sherrilyn Rist, PA-C, and I would suggest that you comply with all of her treatment recommendations, such as getting the MRI of the brain that she ordered. This can also be helpful to rule out alternative causes of cognitive impairment, such as wear-and-tear on blood vessels in the brain, which can present similarly to PD on neuropsychological testing.   For fatigue, I suggest that you work with your primary care physician to make sure that there is no underlying medical cause being missed. If there is no underlying medical cause, then I suggest that you engage in some behavior change. You should not take naps during the day for more than 1 hour per day. If you do take a nap, do not take naps after 2 PM, because this can disrupt your circadian rhythms and sleep cycle, making it more difficult to sleep at night.  Make sure that you are engaged in activities that you find stimulating, fulfilling, and worthwhile during the day. Things that you look forward to. Most individuals do not age as gracefully or do as well when they are not actively engaged in their lives.  Staying active mentally is crucially important. This can include formal types of brain training, such as cognitive rehabilitation, sudoku, crossword puzzles and the like, although the best thing is to stay active and engaged in your life. This includes having meaningful hobbies and interests that you pursue and especially cultivating satisfying social relationships, which are a form of cognitive stimulation and have been shown to be protective in and of themselves.    I would also suggest that you start exercising. If there is confidence that you  have an actual diagnosis of Parkinson's disease, then you may wish to engage in a Parkinson's disease specific form of exercise such as rock steady boxing.  Our group also offers Parkinson's support groups and other resources through our multidisciplinary clinic, which is led by Dr. Carles Collet, movement disorders neurologist. Clarise Cruz can let you know more about these resources.  Test Findings  Test scores are summarized in additional documentation associated with this encounter. Test scores are relative to age, gender, and educational history as available and appropriate. Despite the patient's hearing difficulties, there were no concerns about performance validity as all findings fell within normal expectations. An audio amplification device was used, and this seemed to help him greatly. This suggests that his difficulties may be largely amenable to correction and that he should be wearing his hearing aids and/or go to audiology to correct his hearing.  General Intellectual Functioning/Achievement:  Performance on single word reading was toward the upper end of the average range. Average presents as a reasonable standard of comparison for the patient's cognitive test performance.  Attention and Processing Efficiency: Performance on measures of digit repetition was average. By contrast, he had difficulties with indicators of processing speed, with unusually low timed number-symbol coding and simple numeric sequencing. The latter finding is particularly notable, because this is a very easy test.  Language: Visual object confrontation naming was within normal limits. Generation  of words was low average in response to the letters F-A-S whereas generation of words in response to category prompt "animals" was average.  Visuospatial Function: Performance was toward the upper end of the average range in this domain. Mr. Heppler demonstrated errorless figure copy performance. Judgment of angular line orientations was  average.  Learning and Memory: Performance on measures of learning and memory was essentially within normal limits; however, there were scattered low scores the may be picking up on some diminished encoding efficiency. These findings are broadly consistent with frontal-subcortical issues attenuating memory performance.  In the verbal realm, Mr. Enrico recalled 4, 4, and 4 words of a 12 item word list across 3 learning trials which is low average. Qualitatively, however, this is a very flat learning curve. His short delayed recall was average at 4 words, and his long delayed recall was low average at 3 words. His recognition discriminability for the words from the list versus false choices was low, with more false positives than correct identifications. Memory for a short story was unusually low on immediate recall and low average on delayed recall. In this case, his retention of information across time was good. Memory for brief daily living type information was average on immediate and delayed recall with good retention and comparable very good recognition, falling at the superior level.  Executive Functions: Performance on executive indicators was within normal limits, although the patient's processing speed and memory encoding issues are suggestive of frontal-subcortical difficulties. Performance on the Executive Function Composite of the modified [place] card sorting test was average, with comparable average range categories and perseverative errors scores. Alternating sequencing of numbers and letters of the alphabet was low average. Generation of words in response to letters F-A-S was low average. He scored at a low average level when reasoning with verbal information on the Complex Ideational Material.  Rating Scale(s): Mr. Andrus screened negative for the presence of depression. His wife had a hard time comprehending the informant-report severity staging measure given to her, so it was not filled  out.  I do not feel that I can reliably rate a CDR given the quality of the history in his case.  Viviano Simas Nicole Kindred, PsyD, ABN Clinical Neuropsychologist  Coding and Compliance  Billing below reflects technician time, my direct face-to-face time with the patient, time spent in test administration, and time spent in professional activities including but not limited to: neuropsychological test interpretation, integration of neuropsychological test data with clinical history, report preparation, treatment planning, care coordination, and review of diagnostically pertinent medical history or studies.   Services associated with this encounter: Clinical Interview [phone]) plus 165 minutes ([id number]; Neuropsychological Evaluation by Professional)  115 minutes ([id number]; Neuropsychological Testing by Technician)

## 2021-07-08 NOTE — Telephone Encounter (Signed)
The patient has been notified of the result and verbalized understanding.  All questions (if any) were answered. Marolyn Hammock, [place] 07/08/2021 12:39 PM   Patient/wife is agreeable to treatment.

## 2021-07-13 ENCOUNTER — Encounter: Payer: Medicare PPO | Admitting: Counselor

## 2021-07-15 ENCOUNTER — Encounter: Payer: Self-pay | Admitting: Counselor

## 2021-07-15 ENCOUNTER — Ambulatory Visit (INDEPENDENT_AMBULATORY_CARE_PROVIDER_SITE_OTHER): Payer: Medicare PPO | Admitting: Counselor

## 2021-07-15 ENCOUNTER — Other Ambulatory Visit: Payer: Self-pay

## 2021-07-15 DIAGNOSIS — F0670 Mild neurocognitive disorder due to known physiological condition without behavioral disturbance: Secondary | ICD-10-CM | POA: Diagnosis not present

## 2021-07-15 DIAGNOSIS — G20 Parkinson's disease: Secondary | ICD-10-CM | POA: Diagnosis not present

## 2021-07-15 NOTE — Progress Notes (Signed)
NEUROPSYCHOLOGY FEEDBACK NOTE [facility]  Feedback Note: I met with Matthew Liu to review the findings resulting from his neuropsychological evaluation. Since the last appointment, he has been about the same. Similar to the previous appointment, he was not wearing his hearing aids and has very significant difficulty communicating as a result. I utilized a pocket talker and a see through mask so he could read lips, although I still felt like he was missing the topic at hand on occasion. Some of this may be central but he presents as generally much more impaired than he is per testing. I reinforced with him the importance of wearing hearing aids consistently. Time was spent reviewing the impressions and recommendations that are detailed in the evaluation report. 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 Reviewed importance of exercise, adhering to medication regimen and updating Matthew Liu about efficacy, also suggested he speak with her about our multidisciplinary PD clinic when he is done with workup for his cognitive problems and the focus of treatment switches to his PD. I took time to explain the findings and answer all the patient's questions. I encouraged Matthew Liu to contact me should he have any further questions or if further follow up is desired.   Current Medications and Medical History   Current Outpatient Medications  Medication Sig Dispense Refill   acetaminophen (TYLENOL) 500 MG tablet Take 1 tablet (500 mg total) by mouth every 6 (six) hours as needed for mild pain or headache. 30 tablet 0   ALPRAZolam (XANAX) 0.25 MG tablet Take 0.25 mg by mouth 2 (two) times daily as needed for anxiety.      atorvastatin (LIPITOR) 80 MG tablet Take 1 tablet (80 mg total) by mouth every evening. 90 tablet 3   carbidopa-levodopa (SINEMET IR) 25-100 MG tablet Week 1: Take 0.5 tablet in morning and 0.5 tablet in evening/bedtime. Week 2:  Take 0.5 tablet in morning, 0.5 tablet in afternoon, and 0.5 tablet in evening/bedtime. Week 3 & thereafter: Take 1 tablet three times daily 60 tablet 6   Cholecalciferol (VITAMIN D3) 50 MCG (2000 UT) capsule Take 2,000 Units by mouth daily.     clopidogrel (PLAVIX) 75 MG tablet TAKE 1 TABLET BY MOUTH EVERY DAY 90 tablet 2   hydrochlorothiazide (MICROZIDE) 12.5 MG capsule TAKE 1 CAPSULE BY MOUTH EVERY DAY 90 capsule 3   lisinopril (ZESTRIL) 40 MG tablet Take 20 mg by mouth at bedtime.      metoprolol tartrate (LOPRESSOR) 25 MG tablet Take 0.5 tablets (12.5 mg total) by mouth 2 (two) times daily. 90 tablet 3   Multiple Vitamins-Minerals (ONE-A-DAY MENS 50+) TABS Take 1 tablet by mouth daily with breakfast.     pantoprazole (PROTONIX) 40 MG tablet Take 1 tablet (40 mg total) by mouth daily. 90 tablet 3   traMADol (ULTRAM) 50 MG tablet Take 1 tablet (50 mg total) by mouth every 6 (six) hours as needed for severe pain. 28 tablet 0   No current facility-administered medications for this visit.    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   Coronary artery disease involving native coronary artery of native heart with unstable angina pectoris ([place]) 12/11/2018   Unstable angina (HCC)    Lumbar compression fracture ([place]) 01/09/2018   Lacunar stroke ([place]) 10/13/2015  PAD (peripheral artery disease) ([place]) 10/13/2015   Acute ischemic stroke ([place]) 09/15/2015   Ankle fracture, left    OSA (obstructive sleep apnea)    Erectile dysfunction 12/01/2014   Aspirin allergy 07/16/2013   Dyslipidemia 07/16/2013   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 Speech was normal in rate, rhythm, volume, and prosody. Self-reported mood was "good" and affect was neutral. Thought process was tangential, although much of this may have been because he couldn't follow the thread of conversation due to hearing problems and thought content was appropriate. There were no safety concerns identified at today's encounter, such as thoughts of harming self or others.   Plan  Feedback provided regarding the patient's neuropsychological evaluation. He needs to follow up about numerous issues including MRI of brain, getting his CPAP, and he needs to wear his hearing aids. I suggested he call our office on the phone (he can hear better using the phone) regarding the MRI because he may need another pre-auth, as it was ordered a while ago. Matthew Liu was encouraged to contact me if any questions arise or if further follow up is desired.   Viviano Simas Nicole Kindred, PsyD, ABN Clinical Neuropsychologist  Service(s) Provided at This Encounter: 31 minutes [phone]; Psychotherapy with patient/family)

## 2021-07-15 NOTE — Patient Instructions (Signed)
Your performance and presentation on assessment were consistent with very good performance for the most part, although you did have some very mild difficulties on indicators of processing efficiency. These types of deficits support a diagnosis of mild neurocognitive disorder.  The major difference between mild cognitive impairment (MCI) and dementia is in severity and potential prognosis. Once someone reaches a level of severity adequate to be diagnosed with a dementia, there is usually progression over time, though this may be years. On the other hand, mild cognitive impairment, while a significant risk for dementia in future, does not always progress to dementia, and in some instances stays the same or can even revert to normal. It is important to realize that if MCI is due to underlying Alzheimer's disease, it will most likely progress to dementia eventually. The rate of conversion to Alzheimer's dementia from amnestic MCI is about 15% per year versus the general population risk of conversion of 2% per year.    In your case, you had findings of parkinsonism on exam and I believe the working neurological diagnosis is Parkinson's disease. Parkinson's disease is frequently associated with the type of cognitive difficulties you demonstrated on testing, and 20% or more of patients with Parkinson's will have mild cognitive impairment at the time of diagnosis. I think this is the most likely cause of your difficulties.  You are already seeing Sherrilyn Rist, PA-C, and I would suggest that you comply with all of her treatment recommendations, such as getting the MRI of the brain that she ordered. This can also be helpful to rule out alternative causes of cognitive impairment, such as wear-and-tear on blood vessels in the brain, which can present similarly to PD on neuropsychological testing.   For fatigue, I suggest that you work with your primary care physician to make sure that there is no underlying medical  cause being missed. If there is no underlying medical cause, then I suggest that you engage in some behavior change. You should not take naps during the day for more than 1 hour per day. If you do take a nap, do not take naps after 2 PM, because this can disrupt your circadian rhythms and sleep cycle, making it more difficult to sleep at night.  Make sure that you are engaged in activities that you find stimulating, fulfilling, and worthwhile during the day. Things that you look forward to. Most individuals do not age as gracefully or do as well when they are not actively engaged in their lives.  Staying active mentally is crucially important. This can include formal types of brain training, such as cognitive rehabilitation, sudoku, crossword puzzles and the like, although the best thing is to stay active and engaged in your life. This includes having meaningful hobbies and interests that you pursue and especially cultivating satisfying social relationships, which are a form of cognitive stimulation and have been shown to be protective in and of themselves.     I would also suggest that you start exercising. If there is confidence that you have an actual diagnosis of Parkinson's disease, then you may wish to engage in a Parkinson's disease specific form of exercise such as rock steady boxing.  Our group also offers Parkinson's support groups and other resources through our multidisciplinary clinic, which is led by Dr. Carles Collet, movement disorders neurologist. Clarise Cruz can let you know more about these resources.

## 2021-07-16 DIAGNOSIS — R634 Abnormal weight loss: Secondary | ICD-10-CM | POA: Diagnosis not present

## 2021-07-16 DIAGNOSIS — I700 Atherosclerosis of aorta: Secondary | ICD-10-CM | POA: Diagnosis not present

## 2021-07-16 DIAGNOSIS — Z951 Presence of aortocoronary bypass graft: Secondary | ICD-10-CM | POA: Diagnosis not present

## 2021-07-16 DIAGNOSIS — R918 Other nonspecific abnormal finding of lung field: Secondary | ICD-10-CM | POA: Diagnosis not present

## 2021-07-27 ENCOUNTER — Other Ambulatory Visit: Payer: Self-pay

## 2021-07-27 ENCOUNTER — Encounter: Payer: Self-pay | Admitting: Physician Assistant

## 2021-07-27 ENCOUNTER — Ambulatory Visit: Payer: Medicare PPO | Admitting: Physician Assistant

## 2021-07-27 VITALS — BP 136/79 | HR 81 | Resp 18 | Ht 65.0 in | Wt 157.0 lb

## 2021-07-27 DIAGNOSIS — G20 Parkinson's disease: Secondary | ICD-10-CM | POA: Diagnosis not present

## 2021-07-27 DIAGNOSIS — F0670 Mild neurocognitive disorder due to known physiological condition without behavioral disturbance: Secondary | ICD-10-CM | POA: Insufficient documentation

## 2021-07-27 DIAGNOSIS — G20A1 Parkinson's disease without dyskinesia, without mention of fluctuations: Secondary | ICD-10-CM | POA: Insufficient documentation

## 2021-07-27 DIAGNOSIS — G3184 Mild cognitive impairment, so stated: Secondary | ICD-10-CM | POA: Diagnosis not present

## 2021-07-27 NOTE — Patient Instructions (Signed)
It was a pleasure to see you today at our office.   Recommendations:  Follow up in  6 months Continue Carbidopa Levodopa as prescribed, no changes  COntiue with your other medicines     RECOMMENDATIONS FOR ALL PATIENTS WITH MEMORY PROBLEMS: 1. Continue to exercise (Recommend 30 minutes of walking everyday, or 3 hours every week) 2. Increase social interactions - continue going to [place] and enjoy social gatherings with friends and family 3. Eat healthy, avoid fried foods and eat more fruits and vegetables 4. Maintain adequate blood pressure, blood sugar, and blood cholesterol level. Reducing the risk of stroke and cardiovascular disease also helps promoting better memory. 5. Avoid stressful situations. Live a simple life and avoid aggravations. Organize your time and prepare for the next day in anticipation. 6. Sleep well, avoid any interruptions of sleep and avoid any distractions in the bedroom that may interfere with adequate sleep quality 7. Avoid sugar, avoid sweets as there is a strong link between excessive sugar intake, diabetes, and cognitive impairment We discussed the Mediterranean diet, which has been shown to help patients reduce the risk of progressive memory disorders and reduces cardiovascular risk. This includes eating fish, eat fruits and green leafy vegetables, nuts like almonds and hazelnuts, walnuts, and also use olive oil. Avoid fast foods and fried foods as much as possible. Avoid sweets and sugar as sugar use has been linked to worsening of memory function.  There is always a concern of gradual progression of memory problems. If this is the case, then we may need to adjust level of care according to patient needs. Support, both to the patient and caregiver, should then be put into place.    FALL PRECAUTIONS: Be cautious when walking. Scan the area for obstacles that may increase the risk of trips and falls. When getting up in the mornings, sit up at the edge of the bed  for a few minutes before getting out of bed. Consider elevating the bed at the head end to avoid drop of blood pressure when getting up. Walk always in a well-lit room (use night lights in the walls). Avoid area rugs or power cords from appliances in the middle of the walkways. Use a walker or a cane if necessary and consider physical therapy for balance exercise. Get your eyesight checked regularly.  FINANCIAL OVERSIGHT: Supervision, especially oversight when making financial decisions or transactions is also recommended.  HOME SAFETY: Consider the safety of the kitchen when operating appliances like stoves, microwave oven, and blender. Consider having supervision and share cooking responsibilities until no longer able to participate in those. Accidents with firearms and other hazards in the house should be identified and addressed as well.   ABILITY TO BE LEFT ALONE: If patient is unable to contact 911 operator, consider using LifeLine, or when the need is there, arrange for someone to stay with patients. Smoking is a fire hazard, consider supervision or cessation. Risk of wandering should be assessed by caregiver and if detected at any point, supervision and safe proof recommendations should be instituted.  MEDICATION SUPERVISION: Inability to self-administer medication needs to be constantly addressed. Implement a mechanism to ensure safe administration of the medications.   DRIVING: Regarding driving, in patients with progressive memory problems, driving will be impaired. We advise to have someone else do the driving if trouble finding directions or if minor accidents are reported. Independent driving assessment is available to determine safety of driving.   If you are interested in the driving assessment,  you can contact the following:  [employer] in [place]  [place] [phone]  [place]  [facility]  [phone] or [phone]

## 2021-07-27 NOTE — Progress Notes (Signed)
Assessment/Plan:      Mild neurocognitive disorder due to Parkinson's disease  Recommendations:  Discussed safety both in and out of the home.  Discussed the importance of regular daily schedule with inclusion of crossword puzzles to maintain brain function.  Continue to monitor mood by PCP Stay active at least 30 minutes at least 3 times a week.  Naps should be scheduled and should be no longer than 60 minutes and should not occur after 2 PM.  Mediterranean diet is recommended  Anti Dementia medication is not indicated at this time.  We will continue to monitor Follow up in 6 months.  Parkinson disease Patient is tolerating Sinemet, no new areas of tremors, no worsening of tremors. Continue carbidopa levodopa 25 100 3 times daily   Case discussed with Dr. Delice Lesch who agrees with the plan     Subjective:    Matthew Liu is a 80 y.o. right-handed male with risk factors including hypertension, hyperlipidemia, status post ischemic stroke, sleep apnea not on CPAP, CAD status post CABG, anxiety, very hard of hearing, seen today in follow up for memory loss.  Last MoCA on 03/18/2021 was 22/30.  This patient is here alone. Previous records as well as any outside records available were reviewed prior to todays visit.  Since his last visit, the patient had a neurocognitive evaluation on 07/05/2021.  According to neuropsychology, the thought process was tangential, in the setting of major hearing problems.  However, the thought content was found to be appropriate.  There were no safety concerns identified.  Today, the patient did not have an MRI of the brain for structural abnormalities and vascular load information.  He does not wear his hearing aids on a regular basis which may make him hard to understand commands or words.  Patient is a very difficult historian, and on multiple occasions he becomes tangential in his answers.  However, the neuropsychological testing performance was normal with  an isolated area of low performance showing on measurements of processing speed.  There is no suggestion of storage problem.  Since last visit, the patient denies any new memory changes.  He continues to have slow speech, "I just need some time ".  He does not like to use his hearing aids.  He denies any depression, irritability, hallucinations or paranoia  He has been diagnosed with "a lump in the lung, which keeps him preoccupied.  He sleeps well, denies vivid dreams or sleepwalking.  He is independent of bathing and dressing.  Denies missing medications and is very organized.  Denies leaving objects in unusual places, he takes care of his own finances, does not miss a payment.  Appetite is good, denies trouble swallowing.  No excessive salivation.  He cooks and denies leaving the stove or the faucet on.  Occasionally he falls, does not like to walk much because he is afraid of falling.  He does not like to use a walker or a cane.  He continues to drive without GPS, denies getting lost.  As for his tremors, they are improved since starting Sinemet, which he is tolerating well.  He has right greater than left tremor, but overall, he is able to grab objects without dropping them.  Denies headaches, double vision, dizziness, focal numbness or tingling, unilateral weakness or tremors or anosmia. No history of seizures. Denies urine incontinence, retention, constipation or diarrhea. PREVIOUS MEDICATIONS:   CURRENT MEDICATIONS:  Outpatient Encounter Medications as of 07/27/2021  Medication Sig   acetaminophen (TYLENOL)  500 MG tablet Take 1 tablet (500 mg total) by mouth every 6 (six) hours as needed for mild pain or headache.   ALPRAZolam (XANAX) 0.25 MG tablet Take 0.25 mg by mouth 2 (two) times daily as needed for anxiety.    atorvastatin (LIPITOR) 80 MG tablet Take 1 tablet (80 mg total) by mouth every evening.   carbidopa-levodopa (SINEMET IR) 25-100 MG tablet Week 1: Take 0.5 tablet in morning and 0.5  tablet in evening/bedtime. Week 2: Take 0.5 tablet in morning, 0.5 tablet in afternoon, and 0.5 tablet in evening/bedtime. Week 3 & thereafter: Take 1 tablet three times daily   Cholecalciferol (VITAMIN D3) 50 MCG (2000 UT) capsule Take 2,000 Units by mouth daily.   clopidogrel (PLAVIX) 75 MG tablet TAKE 1 TABLET BY MOUTH EVERY DAY   hydrochlorothiazide (MICROZIDE) 12.5 MG capsule TAKE 1 CAPSULE BY MOUTH EVERY DAY   lisinopril (ZESTRIL) 40 MG tablet Take 20 mg by mouth at bedtime.    metoprolol tartrate (LOPRESSOR) 25 MG tablet Take 0.5 tablets (12.5 mg total) by mouth 2 (two) times daily.   Multiple Vitamins-Minerals (ONE-A-DAY MENS 50+) TABS Take 1 tablet by mouth daily with breakfast.   pantoprazole (PROTONIX) 40 MG tablet Take 1 tablet (40 mg total) by mouth daily.   traMADol (ULTRAM) 50 MG tablet Take 1 tablet (50 mg total) by mouth every 6 (six) hours as needed for severe pain.   No facility-administered encounter medications on file as of 07/27/2021.     Objective:     PHYSICAL EXAMINATION:    VITALS:   Vitals:   07/27/21 1113  BP: 136/79  Pulse: 81  Resp: 18  SpO2: 98%  Weight: 157 lb (71.2 kg)  Height: 5\' 5"  (1.651 m)    GEN:  The patient appears stated age and is in NAD. HEENT:  Normocephalic, atraumatic.   Neurological examination:  General: NAD, well-groomed, appears stated age.  Flat affect. Orientation: He is alert to person, place and time.   Cranial nerves: There is good facial symmetry.The speech is fluent and clear. No aphasia or dysarthria. Fund of knowledge is appropriate. Recent memory is impaired, but remote is intact.  Attention and concentration are reduced.  Able to name objects and repeat phrases.  Hearing is reduced to conversational tone, needs devices..    Sensation: Sensation is intact to light touch throughout Motor: Strength is at least antigravity x4. Tremors: Improved from prior exam, right greater than left Tone: Increased tone on the right  arm, with cogwheeling. DTR's 2/4 in [place] Cognitive Assessment  03/18/2021  Visuospatial/ Executive (0/5) 3  Naming (0/3) 3  Attention: Read list of digits (0/2) 2  Attention: Read list of letters (0/1) 1  Attention: Serial 7 subtraction starting at 100 (0/3) 3  Language: Repeat phrase (0/2) 1  Language : Fluency (0/1) 0  Abstraction (0/2) 0  Delayed Recall (0/5) 3  Orientation (0/6) 5  Total 21  Adjusted Score (based on education) 22   No flowsheet data found.  No flowsheet data found.     Movement examination: Tone: There is normal tone in the UE/LE Abnormal movements: Tremors as above, no myoclonus.  No asterixis.   Coordination:  There is no decremation with RAM's. Normal finger to nose  Gait and Station: The patient has no difficulty arising out of a deep-seated chair without the use of the hands. The patient's stride length is good.  Gait is cautious and narrow.  Right arm with  decreased swing.       Total time spent on today's visit was minutes, including both face-to-face time and nonface-to-face time. Time included that spent on review of records (prior notes available to me/labs/imaging if pertinent), discussing treatment and goals, answering patient's questions and coordinating care.  Cc:  Nicoletta Dress, MD Sharene Butters, PA-C

## 2021-07-28 DIAGNOSIS — J849 Interstitial pulmonary disease, unspecified: Secondary | ICD-10-CM | POA: Diagnosis not present

## 2021-08-02 ENCOUNTER — Other Ambulatory Visit: Payer: Self-pay | Admitting: Physician Assistant

## 2021-08-02 NOTE — Telephone Encounter (Addendum)
APPROVED/ Prior Authorization for titration sent to [facility] via web portal. Auth# [id number] Valid dates Aug 14 2021 - Sep 13 2021   PROCEDURE IS NOT AUTHORIZED - CASE REQUIRES CLINICAL REVIEW.

## 2021-08-04 ENCOUNTER — Ambulatory Visit
Admission: RE | Admit: 2021-08-04 | Discharge: 2021-08-04 | Disposition: A | Discharge status: Home / Self Care | Payer: Medicare PPO | Source: Ambulatory Visit | Attending: Physician Assistant | Admitting: Physician Assistant

## 2021-08-04 DIAGNOSIS — I6381 Other cerebral infarction due to occlusion or stenosis of small artery: Secondary | ICD-10-CM | POA: Diagnosis not present

## 2021-08-04 DIAGNOSIS — I6782 Cerebral ischemia: Secondary | ICD-10-CM | POA: Diagnosis not present

## 2021-08-04 DIAGNOSIS — R413 Other amnesia: Secondary | ICD-10-CM

## 2021-08-04 DIAGNOSIS — G319 Degenerative disease of nervous system, unspecified: Secondary | ICD-10-CM | POA: Diagnosis not present

## 2021-08-09 ENCOUNTER — Telehealth: Payer: Self-pay | Admitting: Physician Assistant

## 2021-08-09 NOTE — Telephone Encounter (Signed)
Pt wife would like a call about Joseandres's mri results. She said she hasn't heard from anyone. She stated [employer] listen, and keeps doing things he isn't supposed to do.

## 2021-08-09 NOTE — Telephone Encounter (Signed)
Patient advised of MRI results.

## 2021-08-11 NOTE — Telephone Encounter (Signed)
Patient is scheduled for CPAP Titration on 08/27/21. Patient understands his titration study will be done at [facility] sleep lab. Patient understands he will receive a letter in a week or so detailing appointment, date, time, and location. Patient understands to call if he does not receive the letter  in a timely manner. Patient agrees with treatment and thanked me for call.

## 2021-08-23 DIAGNOSIS — J84112 Idiopathic pulmonary fibrosis: Secondary | ICD-10-CM | POA: Diagnosis not present

## 2021-08-23 DIAGNOSIS — J90 Pleural effusion, not elsewhere classified: Secondary | ICD-10-CM | POA: Diagnosis not present

## 2021-08-23 DIAGNOSIS — K449 Diaphragmatic hernia without obstruction or gangrene: Secondary | ICD-10-CM | POA: Diagnosis not present

## 2021-08-23 DIAGNOSIS — J849 Interstitial pulmonary disease, unspecified: Secondary | ICD-10-CM | POA: Diagnosis not present

## 2021-08-23 DIAGNOSIS — R634 Abnormal weight loss: Secondary | ICD-10-CM | POA: Diagnosis not present

## 2021-08-26 ENCOUNTER — Other Ambulatory Visit: Payer: Self-pay

## 2021-08-26 ENCOUNTER — Ambulatory Visit: Payer: Medicare PPO | Admitting: Pulmonary Disease

## 2021-08-26 ENCOUNTER — Encounter: Payer: Self-pay | Admitting: Pulmonary Disease

## 2021-08-26 VITALS — BP 130/68 | HR 53 | Temp 97.8°F | Ht 69.0 in | Wt 160.0 lb

## 2021-08-26 DIAGNOSIS — J849 Interstitial pulmonary disease, unspecified: Secondary | ICD-10-CM | POA: Diagnosis not present

## 2021-08-26 NOTE — Patient Instructions (Addendum)
  I have reviewed his CT scan which shows some mild scarring in the lung Will check some labs for further evaluation of office We will also schedule lung function testing follow-up in clinic I will discuss these results with you in detail at return visit and plan for next steps

## 2021-08-26 NOTE — Progress Notes (Signed)
Matthew Liu    [id number]    Mar 08, 1941  Primary Care Physician:Schultz, Lora Havens, MD  Referring Physician: Nicoletta Dress, MD [address],  [place] [address]  Chief complaint: Consult for interstitial lung disease  HPI: 80 year old with history of hypertension, coronary artery disease, hyperlipidemia, allergies, GERD He had an abnormal x-ray at his primary care with a follow-up CT showing UIP pattern pulmonary fibrosis at [facility] and has been referred here for further evaluation Complains of mild dyspnea on exertion.  No cough or fevers or chills  He is also being worked up for sleep apnea.  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  No feather pillows or comforter ILD questionnaire 08/26/2021-negative Smoking history: Never smoker Travel history: No significant travel history Relevant family history: No family history of lung disease   Outpatient Encounter Medications as of 08/26/2021  Medication Sig   acetaminophen (TYLENOL) 500 MG tablet Take 1 tablet (500 mg total) by mouth every 6 (six) hours as needed for mild pain or headache.   ALPRAZolam (XANAX) 0.25 MG tablet Take 0.25 mg by mouth 2 (two) times daily as needed for anxiety.    atorvastatin (LIPITOR) 80 MG tablet Take 1 tablet (80 mg total) by mouth every evening.   carbidopa-levodopa (SINEMET IR) 25-100 MG tablet WEEK 1: TAKE 0.5 TABLET IN MORNING AND 0.5 TABLET IN EVENING/BEDTIME. WEEK 2: TAKE 0.5 TABLET IN MORNING, 0.5 TABLET IN AFTERNOON, AND 0.5 TABLET IN EVENING/BEDTIME. WEEK 3 & THEREAFTER: TAKE 1 TABLET THREE TIMES DAILY   Cholecalciferol (VITAMIN D3) 50 MCG (2000 UT) capsule Take 2,000 Units by mouth daily.   clopidogrel (PLAVIX) 75 MG tablet TAKE 1 TABLET BY MOUTH EVERY DAY   FLUoxetine (PROZAC) 40  MG capsule Take by mouth.   hydrochlorothiazide (MICROZIDE) 12.5 MG capsule TAKE 1 CAPSULE BY MOUTH EVERY DAY   lisinopril (ZESTRIL) 40 MG tablet Take 20 mg by mouth at bedtime.    metoprolol tartrate (LOPRESSOR) 25 MG tablet Take 0.5 tablets (12.5 mg total) by mouth 2 (two) times daily.   mirtazapine (REMERON) 7.5 MG tablet Take 7.5 mg by mouth at bedtime.   Multiple Vitamins-Minerals (ONE-A-DAY MENS 50+) TABS Take 1 tablet by mouth daily with breakfast.   pantoprazole (PROTONIX) 40 MG tablet Take 1 tablet (40 mg total) by mouth daily.   [DISCONTINUED] traMADol (ULTRAM) 50 MG tablet Take 1 tablet (50 mg total) by mouth every 6 (six) hours as needed for severe pain.   No facility-administered encounter medications on file as of 08/26/2021.    Allergies as of 08/26/2021 - Review Complete 08/26/2021  Allergen Reaction Noted   Aspirin Hives and Shortness Of Breath 07/13/2013   Quinine derivatives Hives and Other (See Comments) 07/13/2013    Past Medical History:  Diagnosis Date   Acute ischemic stroke ([place]) 09/15/2015   Ankle fracture, left 2009   rod placed   Anxiety    Aspirin allergy    a. Anaphylactic rxn.   Barrett's esophagus    CAD (coronary artery disease)    a. Inf STEMI 07/2013: s/p DES x 2 from ostial to [facility] 07/13/13. Residual Cx disease for med rx (PCI if recurrent pain).   Controlled gout    Dyslipidemia    ED (erectile dysfunction)  Edema of both legs    Gastric polyp    GERD (gastroesophageal reflux disease)    Hiatal hernia    History of nuclear stress test    Nuclear stress test 5/19: EF 53, no ischemia, no infarction; Low Risk   Hydrocele of testis    Hyperglycemia    a. A1C 5.8 in 07/2013.   Hypertension    Myocardial infarction ([place])    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   LIMA to LAD, SVG to Diag, Sequential SVG to OM2-OM3, SVG to RCA, EVH via right thigh and leg    Past Surgical History:  Procedure Laterality Date   BACK SURGERY     L1 kyphoplasty by Dr Trenton Gammon  03/2018     thoractic 02-2018   cataract surgery      bilateral    COLONOSCOPY     CORONARY ANGIOPLASTY     stents    CORONARY ARTERY BYPASS GRAFT N/A 12/12/2018   Procedure: CORONARY ARTERY BYPASS GRAFTING (CABG);  Surgeon: Rexene Alberts, MD;  Location: [facility];  Service: Open Heart Surgery;  Laterality: N/A;   ESOPHAGOGASTRODUODENOSCOPY  04/04/2016   Short-segment Barrett's esophagus (biopsied) Gastric polyps (status post polypectomy x1) Hiatal hernia   HYDROCELE EXCISION Left 07/17/2014   Procedure: LEFT HYDROCELECTOMY;  Surgeon: Malka So, MD;  Location: [facility];  Service: Urology;  Laterality: Left;   left ankle surgery  1999   LEFT HEART CATH AND CORONARY ANGIOGRAPHY N/A 12/11/2018   Procedure: LEFT HEART CATH AND CORONARY ANGIOGRAPHY;  Surgeon: Belva Crome, MD;  Location: [facility] CV LAB;  Service: Cardiovascular;  Laterality: N/A;   LEFT HEART CATHETERIZATION WITH CORONARY ANGIOGRAM N/A 07/13/2013   Procedure: LEFT HEART CATHETERIZATION WITH CORONARY ANGIOGRAM;  Surgeon: Sinclair Grooms, MD;  Location: [facility] CATH LAB;  Service: Cardiovascular;  Laterality: N/A;   PERCUTANEOUS CORONARY STENT INTERVENTION (PCI-S)  07/13/2013   Procedure: PERCUTANEOUS CORONARY STENT INTERVENTION (PCI-S);  Surgeon: Sinclair Grooms, MD;  Location: [facility] CATH LAB;  Service: Cardiovascular;;   TEE WITHOUT CARDIOVERSION N/A 12/12/2018   Procedure: TRANSESOPHAGEAL ECHOCARDIOGRAM (TEE);  Surgeon: Rexene Alberts, MD;  Location: [facility];  Service: Open Heart Surgery;  Laterality: N/A;   TONSILLECTOMY      Family History  Problem Relation Age of Onset   Hypertension Mother    Ovarian cancer Mother    Heart failure Father    Stroke Neg Hx     Social History   Socioeconomic History   Marital status: Married    Spouse name: Electrical engineer   Number of children: 2   Years of education: 12   Highest education level: Not on file  Occupational History   Occupation: Contractor  Tobacco Use    Smoking status: Never   Smokeless tobacco: Never  Vaping Use   Vaping Use: Never used  Substance and Sexual Activity   Alcohol use: Yes    Comment: occasional beer    Drug use: No   Sexual activity: Not Currently  Other Topics Concern   Not on file  Social History Narrative   Lives with wife   Caffeine use: 1-2 cups coffee per day   Right handed   Social Determinants of Health   Financial Resource Strain: Not on file  Food Insecurity: Not on file  Transportation Needs: Not on file  Physical Activity: Not on file  Stress: Not on file  Social Connections: Not on file  Intimate Partner Violence: Not on  file    Review of systems: Review of Systems  Constitutional: Negative for fever and chills.  HENT: Negative.   Eyes: Negative for blurred vision.  Respiratory: as per HPI  Cardiovascular: Negative for chest pain and palpitations.  Gastrointestinal: Negative for vomiting, diarrhea, blood per rectum. Genitourinary: Negative for dysuria, urgency, frequency and hematuria.  Musculoskeletal: Negative for myalgias, back pain and joint pain.  Skin: Negative for itching and rash.  Neurological: Negative for dizziness, tremors, focal weakness, seizures and loss of consciousness.  Endo/Heme/Allergies: Negative for environmental allergies.  Psychiatric/Behavioral: Negative for depression, suicidal ideas and hallucinations.  All other systems reviewed and are negative.  Physical Exam: Blood pressure 130/68, pulse (!) 53, temperature 97.8 F (36.6 C), temperature source Oral, height 5\' 9"  (1.753 m), weight 160 lb (72.6 kg), SpO2 95 %. Gen:      No acute distress HEENT:  EOMI, sclera anicteric Neck:     No masses; no thyromegaly Lungs:    Clear to auscultation bilaterally; normal respiratory effort CV:         Regular rate and rhythm; no murmurs Abd:      + bowel sounds; soft, non-tender; no palpable masses, no distension Ext:    No edema; adequate peripheral perfusion Skin:       Warm and dry; no rash Neuro: alert and oriented x 3 Psych: normal mood and affect  Data Reviewed: Imaging: CT chest 01/08/2018- basal predominant traction bronchiectasis, reticulation High-res CT 06/23/2021-mild progression of basilar predominant fibrotic changes with minimal honeycombing in the right middle lobe base.  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  High suspicion for IPF He does not have significant exposures or signs and symptoms of connective tissue disease  We will get baseline CTD serologies and PFTs Follow-up in clinic in 1 month for review of tests and plan for next steps.  He will likely be a candidate for antifibrotic therapy  Discussed plan in detail with patient today  Plan/Recommendations: CTD serologies PFTs  Marshell Garfinkel MD  Pulmonary and Critical Care 08/26/2021, 3:51 PM  CC: Nicoletta Dress, MD

## 2021-08-27 ENCOUNTER — Ambulatory Visit (HOSPITAL_BASED_OUTPATIENT_CLINIC_OR_DEPARTMENT_OTHER): Payer: Medicare PPO | Attending: Cardiology | Admitting: Cardiology

## 2021-08-27 ENCOUNTER — Other Ambulatory Visit: Payer: Self-pay

## 2021-08-27 VITALS — Ht 69.0 in | Wt 160.0 lb

## 2021-08-27 DIAGNOSIS — G4733 Obstructive sleep apnea (adult) (pediatric): Secondary | ICD-10-CM | POA: Diagnosis not present

## 2021-08-30 LAB — SJOGREN'S SYNDROME ANTIBODS(SSA + SSB)
SSA (Ro) (ENA) Antibody, IgG: <1 AI
SSB (La) (ENA) Antibody, IgG: <1 AI

## 2021-08-30 LAB — ANA,IFA RA DIAG PNL W/RFLX TIT/PATN
Anti Nuclear Antibody (ANA): NEGATIVE
Cyclic Citrullin Peptide Ab: <16 UNITS
Rheumatoid fact SerPl-aCnc: <14 IU/mL (ref <14)

## 2021-08-30 LAB — ANTI-SCLERODERMA ANTIBODY: Scleroderma (Scl-70) (ENA) Antibody, IgG: <1 AI

## 2021-08-30 LAB — ANCA SCREEN W REFLEX TITER: ANCA Screen: NEGATIVE

## 2021-09-02 ENCOUNTER — Other Ambulatory Visit: Payer: Self-pay | Admitting: Physician Assistant

## 2021-09-06 ENCOUNTER — Other Ambulatory Visit: Payer: Self-pay

## 2021-09-06 NOTE — Procedures (Signed)
   Patient Name: Matthew Liu, Nohr Date: 08/27/2021 Gender: Male D.O.B: [birth year] Age (years): 63 Referring Provider: Daneen Schick Height (inches): 69 Interpreting Physician: Fransico Him MD, ABSM Weight (lbs): 152 RPSGT: Jorge Ny BMI: 22 MRN: [id number] Neck Size: 15.50  CLINICAL INFORMATION The patient is referred for a CPAP titration to treat sleep apnea.  SLEEP STUDY TECHNIQUE As per the AASM Manual for the Scoring of Sleep and Associated Events v2.3 (April 2016) with a hypopnea requiring 4% desaturations.  The channels recorded and monitored were frontal, central and occipital EEG, electrooculogram (EOG), submentalis EMG (chin), nasal and oral airflow, thoracic and abdominal wall motion, anterior tibialis EMG, snore microphone, electrocardiogram, and pulse oximetry. Continuous positive airway pressure (CPAP) was initiated at the beginning of the study and titrated to treat sleep-disordered breathing.  MEDICATIONS Medications self-administered by patient taken the night of the study : ATORVASTATIN, LISINOPRIL, METOPROLOL  TECHNICIAN COMMENTS Comments added by technician: Patient had difficulty initiating sleep. Patient was restless all through the night. Comments added by scorer: N/A  RESPIRATORY PARAMETERS Optimal PAP Pressure (cm): N/A  AHI at Optimal Pressure (/hr):N/A Overall Minimal O2 (%):85.0  Supine % at Optimal Pressure (%):N/A Minimal O2 at Optimal Pressure (%): N/A   SLEEP ARCHITECTURE The study was initiated at 10:22:29 PM and ended at 5:55:00 AM.  Sleep onset time was 148.2 minutes and the sleep efficiency was 46.5%. The total sleep time was 210.5 minutes.  The patient spent 10.7% of the night in stage N1 sleep, 81.5% in stage N2 sleep, 0.0% in stage N3 and 7.8% in REM.Stage REM latency was 287.5 minutes  Wake after sleep onset was 93.8. Alpha intrusion was absent. Supine sleep was 56.06%.  CARDIAC DATA The 2 lead EKG demonstrated sinus rhythm.  The mean heart rate was 66.2 beats per minute. Other EKG findings include: None.  LEG MOVEMENT DATA The total Periodic Limb Movements of Sleep (PLMS) were 0. The PLMS index was 0.0. A PLMS index of <15 is considered normal in adults.  IMPRESSIONS - The optimal PAP pressure could not be determined during this study due to ongoing respiratory events. - Moderate oxygen desaturations were observed during this titration (min O2 = 85.0%). - The patient snored with moderate snoring volume during this titration study. - No cardiac abnormalities were observed during this study. - Clinically significant periodic limb movements were not noted during this study. Arousals associated with PLMs were significant.  DIAGNOSIS - Obstructive Sleep Apnea (G47.33)  RECOMMENDATIONS - Trial of auto CPAP therapy ofrom 4 to 15cm H2O with a Medium size Resmed Full Face Mask AirFit F30 mask and heated humidification. - Avoid alcohol, sedatives and other CNS depressants that may worsen sleep apnea and disrupt normal sleep architecture. - Sleep hygiene should be reviewed to assess factors that may improve sleep quality. - Weight management and regular exercise should be initiated or continued. - Return to [facility] Center for re-evaluation after 6 weeks of therapy  [Electronically signed] 09/06/2021 04:04 PM  Fransico Him MD, ABSM Diplomate, American Board of Sleep Medicine

## 2021-09-07 LAB — HYPERSENSITIVITY PNEUMONITIS
A. Pullulans Abs: NEGATIVE
A.Fumigatus #1 Abs: NEGATIVE
Micropolyspora faeni, IgG: NEGATIVE
Pigeon Serum Abs: NEGATIVE
Thermoact. Saccharii: NEGATIVE
Thermoactinomyces vulgaris, IgG: NEGATIVE

## 2021-09-11 ENCOUNTER — Telehealth: Payer: Self-pay | Admitting: *Deleted

## 2021-09-11 DIAGNOSIS — G4733 Obstructive sleep apnea (adult) (pediatric): Secondary | ICD-10-CM

## 2021-09-11 NOTE — Telephone Encounter (Signed)
The patient has been notified of the result and verbalized understanding.  All questions (if any) were answered. Matthew Liu, [name] 09/11/2021 5:59 PM    Upon patient request DME selection is [facility] Patient understands he will be contacted by [facility] to set up his cpap. Patient understands to call if [facility] does not contact him with new setup in a timely manner. Patient understands they will be called once confirmation has been received from [facility] that they have received their new machine to schedule 10 week follow up appointment.   [facility] notified of new cpap order  Please add to airview Patient was grateful for the call and thanked me

## 2021-09-11 NOTE — Telephone Encounter (Signed)
-----   Message from Sueanne Margarita, MD sent at 09/06/2021  4:06 PM EST ----- Please let patient know that they had a successful PAP titration and let DME know that orders are in EPIC.  Please set up 6 week OV with me. Please get an overnight pulse ox on CPAP

## 2021-09-14 NOTE — Addendum Note (Signed)
Addended by: Freada Bergeron on: 09/14/2021 05:59 PM   Modules accepted: Orders

## 2021-09-29 LAB — MYOMARKER 3 PLUS PROFILE (RDL)

## 2021-10-20 ENCOUNTER — Other Ambulatory Visit: Payer: Self-pay

## 2021-10-20 ENCOUNTER — Encounter: Payer: Self-pay | Admitting: Pulmonary Disease

## 2021-10-20 ENCOUNTER — Ambulatory Visit: Payer: Medicare PPO | Admitting: Pulmonary Disease

## 2021-10-20 ENCOUNTER — Ambulatory Visit (INDEPENDENT_AMBULATORY_CARE_PROVIDER_SITE_OTHER): Payer: Medicare PPO | Admitting: Pulmonary Disease

## 2021-10-20 VITALS — BP 108/64 | HR 64 | Temp 98.2°F | Ht 69.0 in | Wt 160.0 lb

## 2021-10-20 DIAGNOSIS — J849 Interstitial pulmonary disease, unspecified: Secondary | ICD-10-CM

## 2021-10-20 DIAGNOSIS — J84112 Idiopathic pulmonary fibrosis: Secondary | ICD-10-CM | POA: Insufficient documentation

## 2021-10-20 LAB — PULMONARY FUNCTION TEST
DL/VA % pred: 96 %
DL/VA: 3.76 ml/min/mmHg/L
DLCO unc % pred: 62 %
DLCO unc: 14.67 ml/min/mmHg
FEF 25-75 Post: 2.97 L/sec
FEF 25-75 Pre: 3.07 L/sec
FEF2575-%Change-Post: -3 %
FEF2575-%Pred-Post: 162 %
FEF2575-%Pred-Pre: 167 %
FEV1-%Change-Post: 0 %
FEV1-%Pred-Post: 89 %
FEV1-%Pred-Pre: 88 %
FEV1-Post: 2.42 L
FEV1-Pre: 2.4 L
FEV1FVC-%Change-Post: 3 %
FEV1FVC-%Pred-Pre: 117 %
FEV6-%Change-Post: -1 %
FEV6-%Pred-Post: 78 %
FEV6-%Pred-Pre: 79 %
FEV6-Post: 2.79 L
FEV6-Pre: 2.83 L
FEV6FVC-%Pred-Post: 107 %
FEV6FVC-%Pred-Pre: 107 %
FVC-%Change-Post: -2 %
FVC-%Pred-Post: 73 %
FVC-%Pred-Pre: 74 %
FVC-Post: 2.79 L
FVC-Pre: 2.85 L
Post FEV1/FVC ratio: 87 %
Post FEV6/FVC ratio: 100 %
Pre FEV1/FVC ratio: 84 %
Pre FEV6/FVC Ratio: 100 %

## 2021-10-20 NOTE — Progress Notes (Signed)
Full PFT performed today. °

## 2021-10-20 NOTE — Patient Instructions (Signed)
Full PFT performed today. °

## 2021-10-20 NOTE — Patient Instructions (Signed)
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  This is a progressive scarring process of the lung which is impairing your lung function  We discussed treatment options which are pills which can slow down the progression of this disease Please discuss this with your wife and will talk about it again at return clinic visit in 2 to 3 months.

## 2021-10-20 NOTE — Progress Notes (Signed)
Matthew Liu    [id number]    [birth year]  Primary Care Physician:Schultz, Lora Havens, MD  Referring Physician: Nicoletta Dress, MD [name],  [place] [address]  Chief complaint: Consult for interstitial lung disease  HPI: 81 year old with history of hypertension, coronary artery disease, hyperlipidemia, allergies, GERD He had an abnormal x-ray at his primary care with a follow-up CT showing UIP pattern pulmonary fibrosis at [facility] and has been referred here for further evaluation Complains of mild dyspnea on exertion.  No cough or fevers or chills  He is also being worked up for sleep apnea.  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  No feather pillows or comforter ILD questionnaire 08/26/2021-negative Smoking history: Never smoker Travel history: No significant travel history Relevant family history: No family history of lung disease   Outpatient Encounter Medications as of 10/20/2021  Medication Sig   acetaminophen (TYLENOL) 500 MG tablet Take 1 tablet (500 mg total) by mouth every 6 (six) hours as needed for mild pain or headache.   ALPRAZolam (XANAX) 0.25 MG tablet Take 0.25 mg by mouth 2 (two) times daily as needed for anxiety.    atorvastatin (LIPITOR) 80 MG tablet Take 1 tablet (80 mg total) by mouth every evening.   carbidopa-levodopa (SINEMET IR) 25-100 MG tablet Take 1 tablet by mouth 3 (three) times daily. Take 1 tablet three times daily   Cholecalciferol (VITAMIN D3) 50 MCG (2000 UT) capsule Take 2,000 Units by mouth daily.   clopidogrel (PLAVIX) 75 MG tablet TAKE 1 TABLET BY MOUTH EVERY DAY   FLUoxetine (PROZAC) 40 MG capsule Take by mouth.   hydrochlorothiazide (MICROZIDE) 12.5 MG capsule TAKE 1 CAPSULE BY MOUTH EVERY DAY   lisinopril (ZESTRIL) 40 MG  tablet Take 20 mg by mouth at bedtime.    metoprolol tartrate (LOPRESSOR) 25 MG tablet Take 0.5 tablets (12.5 mg total) by mouth 2 (two) times daily.   mirtazapine (REMERON) 7.5 MG tablet Take 7.5 mg by mouth at bedtime.   Multiple Vitamins-Minerals (ONE-A-DAY MENS 50+) TABS Take 1 tablet by mouth daily with breakfast.   pantoprazole (PROTONIX) 40 MG tablet Take 1 tablet (40 mg total) by mouth daily.   No facility-administered encounter medications on file as of 10/20/2021.    Allergies as of 10/20/2021 - Review Complete 10/20/2021  Allergen Reaction Noted   Aspirin Hives and Shortness Of Breath 07/13/2013   Quinine derivatives Hives and Other (See Comments) 07/13/2013    Past Medical History:  Diagnosis Date   Acute ischemic stroke ([place]) 09/15/2015   Ankle fracture, left 2009   rod placed   Anxiety    Aspirin allergy    a. Anaphylactic rxn.   Barrett's esophagus    CAD (coronary artery disease)    a. Inf STEMI 07/2013: s/p DES x 2 from ostial to [facility] 07/13/13. Residual Cx disease for med rx (PCI if recurrent pain).   Controlled gout    Dyslipidemia    ED (erectile dysfunction)    Edema of both legs    Gastric polyp    GERD (gastroesophageal reflux disease)    Hiatal hernia    History of nuclear stress test    Nuclear stress test 5/19: EF 53, no ischemia, no infarction; Low Risk   Hydrocele  of testis    Hyperglycemia    a. A1C 5.8 in 07/2013.   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   LIMA to LAD, SVG to Diag, Sequential SVG to OM2-OM3, SVG to RCA, EVH via right thigh and leg    Past Surgical History:  Procedure Laterality Date   BACK SURGERY     L1 kyphoplasty by Dr Trenton Gammon 03/2018     thoractic 02-2018   cataract surgery      bilateral    COLONOSCOPY     CORONARY ANGIOPLASTY     stents    CORONARY ARTERY BYPASS GRAFT N/A 12/12/2018   Procedure: CORONARY ARTERY BYPASS GRAFTING (CABG);  Surgeon: Rexene Alberts, MD;  Location: [facility];   Service: Open Heart Surgery;  Laterality: N/A;   ESOPHAGOGASTRODUODENOSCOPY  04/04/2016   Short-segment Barrett's esophagus (biopsied) Gastric polyps (status post polypectomy x1) Hiatal hernia   HYDROCELE EXCISION Left 07/17/2014   Procedure: LEFT HYDROCELECTOMY;  Surgeon: Malka So, MD;  Location: [facility];  Service: Urology;  Laterality: Left;   left ankle surgery  1999   LEFT HEART CATH AND CORONARY ANGIOGRAPHY N/A 12/11/2018   Procedure: LEFT HEART CATH AND CORONARY ANGIOGRAPHY;  Surgeon: Belva Crome, MD;  Location: [facility];  Service: Cardiovascular;  Laterality: N/A;   LEFT HEART CATHETERIZATION WITH CORONARY ANGIOGRAM N/A 07/13/2013   Procedure: LEFT HEART CATHETERIZATION WITH CORONARY ANGIOGRAM;  Surgeon: Sinclair Grooms, MD;  Location: [facility] CATH LAB;  Service: Cardiovascular;  Laterality: N/A;   PERCUTANEOUS CORONARY STENT INTERVENTION (PCI-S)  07/13/2013   Procedure: PERCUTANEOUS CORONARY STENT INTERVENTION (PCI-S);  Surgeon: Sinclair Grooms, MD;  Location: [facility] CATH LAB;  Service: Cardiovascular;;   TEE WITHOUT CARDIOVERSION N/A 12/12/2018   Procedure: TRANSESOPHAGEAL ECHOCARDIOGRAM (TEE);  Surgeon: Rexene Alberts, MD;  Location: [facility];  Service: Open Heart Surgery;  Laterality: N/A;   TONSILLECTOMY      Family History  Problem Relation Age of Onset   Hypertension Mother    Ovarian cancer Mother    Heart failure Father    Stroke Neg Hx     Social History   Socioeconomic History   Marital status: Married    Spouse name: Electrical engineer   Number of children: 2   Years of education: 12   Highest education level: Not on file  Occupational History   Occupation: Contractor  Tobacco Use   Smoking status: Never   Smokeless tobacco: Never  Vaping Use   Vaping Use: Never used  Substance and Sexual Activity   Alcohol use: Yes    Comment: occasional beer    Drug use: No   Sexual activity: Not Currently  Other Topics Concern   Not on file  Social  History Narrative   Lives with wife   Caffeine use: 1-2 cups coffee per day   Right handed   Social Determinants of Health   Financial Resource Strain: Not on file  Food Insecurity: Not on file  Transportation Needs: Not on file  Physical Activity: Not on file  Stress: Not on file  Social Connections: Not on file  Intimate Partner Violence: Not on file    Review of systems: Review of Systems  Constitutional: Negative for fever and chills.  HENT: Negative.   Eyes: Negative for blurred vision.  Respiratory: as per HPI  Cardiovascular: Negative for chest pain and palpitations.  Gastrointestinal: Negative for vomiting, diarrhea, blood  per rectum. Genitourinary: Negative for dysuria, urgency, frequency and hematuria.  Musculoskeletal: Negative for myalgias, back pain and joint pain.  Skin: Negative for itching and rash.  Neurological: Negative for dizziness, tremors, focal weakness, seizures and loss of consciousness.  Endo/Heme/Allergies: Negative for environmental allergies.  Psychiatric/Behavioral: Negative for depression, suicidal ideas and hallucinations.  All other systems reviewed and are negative.  Physical Exam: Blood pressure 130/68, pulse (!) 53, temperature 97.8 F (36.6 C), temperature source Oral, height 5\' 9"  (1.753 m), weight 160 lb (72.6 kg), SpO2 95 %. Gen:      No acute distress HEENT:  EOMI, sclera anicteric Neck:     No masses; no thyromegaly Lungs:    Clear to auscultation bilaterally; normal respiratory effort CV:         Regular rate and rhythm; no murmurs Abd:      + bowel sounds; soft, non-tender; no palpable masses, no distension Ext:    No edema; adequate peripheral perfusion Skin:      Warm and dry; no rash Neuro: alert and oriented x 3 Psych: normal mood and affect  Data Reviewed: Imaging: CT chest 01/08/2018- basal predominant traction bronchiectasis, reticulation High-res CT 06/23/2021-mild progression of basilar predominant fibrotic changes  with minimal honeycombing in the right middle lobe base.  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  High suspicion for IPF He does not have significant exposures or signs and symptoms of connective tissue disease, CTD serologies are negative  We discussed his diagnosis and trajectory of disease.  We reviewed treatment options such as antifibrotics to slow down the progression of disease.    He is not sure if he wants to proceed with treatment as he is already on multiple medications and he is concerned about side effects.  He wants to discuss this further with his wife.  I encouraged him to bring his wife to return visit in 2 to 3 months when we can talk again  Plan/Recommendations: Return to clinic in 3 months  Marshell Garfinkel MD [facility] 10/20/2021, 4:12 PM  CC: Nicoletta Dress, MD

## 2021-11-17 ENCOUNTER — Other Ambulatory Visit: Payer: Self-pay

## 2021-11-17 ENCOUNTER — Telehealth: Payer: Self-pay | Admitting: Internal Medicine

## 2021-11-17 ENCOUNTER — Encounter (HOSPITAL_COMMUNITY): Payer: Self-pay | Admitting: *Deleted

## 2021-11-17 ENCOUNTER — Emergency Department (HOSPITAL_COMMUNITY): Payer: Medicare PPO

## 2021-11-17 ENCOUNTER — Emergency Department (HOSPITAL_COMMUNITY)
Admission: EM | Admit: 2021-11-17 | Discharge: 2021-11-18 | Disposition: A | Discharge status: Home / Self Care | Payer: Medicare PPO | Attending: Emergency Medicine | Admitting: Emergency Medicine

## 2021-11-17 DIAGNOSIS — R0602 Shortness of breath: Secondary | ICD-10-CM | POA: Diagnosis not present

## 2021-11-17 DIAGNOSIS — R059 Cough, unspecified: Secondary | ICD-10-CM | POA: Diagnosis not present

## 2021-11-17 DIAGNOSIS — J189 Pneumonia, unspecified organism: Secondary | ICD-10-CM | POA: Diagnosis not present

## 2021-11-17 DIAGNOSIS — R3129 Other microscopic hematuria: Secondary | ICD-10-CM | POA: Diagnosis not present

## 2021-11-17 DIAGNOSIS — Z20822 Contact with and (suspected) exposure to covid-19: Secondary | ICD-10-CM | POA: Diagnosis not present

## 2021-11-17 DIAGNOSIS — Z5321 Procedure and treatment not carried out due to patient leaving prior to being seen by health care provider: Secondary | ICD-10-CM | POA: Diagnosis not present

## 2021-11-17 DIAGNOSIS — R9431 Abnormal electrocardiogram [ECG] [EKG]: Secondary | ICD-10-CM | POA: Diagnosis not present

## 2021-11-17 DIAGNOSIS — R509 Fever, unspecified: Secondary | ICD-10-CM | POA: Insufficient documentation

## 2021-11-17 DIAGNOSIS — R0902 Hypoxemia: Secondary | ICD-10-CM | POA: Diagnosis not present

## 2021-11-17 DIAGNOSIS — R051 Acute cough: Secondary | ICD-10-CM | POA: Diagnosis not present

## 2021-11-17 DIAGNOSIS — J439 Emphysema, unspecified: Secondary | ICD-10-CM | POA: Diagnosis not present

## 2021-11-17 LAB — BASIC METABOLIC PANEL
Anion gap: 8 (ref 5–15)
BUN: 29 mg/dL — ABNORMAL HIGH (ref 8–23)
CO2: 24 mmol/L (ref 22–32)
Calcium: 9 mg/dL (ref 8.9–10.3)
Chloride: 104 mmol/L (ref 98–111)
Creatinine, Ser: 1.41 mg/dL — ABNORMAL HIGH (ref 0.61–1.24)
GFR, Estimated: 50 mL/min — ABNORMAL LOW (ref >60)
Glucose, Bld: 101 mg/dL — ABNORMAL HIGH (ref 70–99)
Potassium: 4.4 mmol/L (ref 3.5–5.1)
Sodium: 136 mmol/L (ref 135–145)

## 2021-11-17 LAB — CBC WITH DIFFERENTIAL/PLATELET
Abs Immature Granulocytes: 0.05 10*3/uL (ref 0.00–0.07)
Basophils Absolute: 0 10*3/uL (ref 0.0–0.1)
Basophils Relative: 0 %
Eosinophils Absolute: 0.1 10*3/uL (ref 0.0–0.5)
Eosinophils Relative: 1 %
HCT: 34 % — ABNORMAL LOW (ref 39.0–52.0)
Hemoglobin: 11.5 g/dL — ABNORMAL LOW (ref 13.0–17.0)
Immature Granulocytes: 0 %
Lymphocytes Relative: 14 %
Lymphs Abs: 1.8 10*3/uL (ref 0.7–4.0)
MCH: 33.9 pg (ref 26.0–34.0)
MCHC: 33.8 g/dL (ref 30.0–36.0)
MCV: 100.3 fL — ABNORMAL HIGH (ref 80.0–100.0)
Monocytes Absolute: 0.9 10*3/uL (ref 0.1–1.0)
Monocytes Relative: 7 %
Neutro Abs: 9.5 10*3/uL — ABNORMAL HIGH (ref 1.7–7.7)
Neutrophils Relative %: 78 %
Platelets: 229 10*3/uL (ref 150–400)
RBC: 3.39 MIL/uL — ABNORMAL LOW (ref 4.22–5.81)
RDW: 13.4 % (ref 11.5–15.5)
WBC: 12.4 10*3/uL — ABNORMAL HIGH (ref 4.0–10.5)
nRBC: 0 % (ref 0.0–0.2)

## 2021-11-17 LAB — RESP PANEL BY RT-PCR (FLU A&B, COVID) ARPGX2
Influenza A by PCR: NEGATIVE
Influenza B by PCR: NEGATIVE
SARS Coronavirus 2 by RT PCR: NEGATIVE

## 2021-11-17 NOTE — ED Triage Notes (Signed)
Pt arrives via [facility], Per report, pt c/o Fever, chills from home. Denies further complaints. Alert/oriented. 99.5 tympanic temp, 120/60, hr 88 irregular, saturations 93% RA, RR 18. Hx of pulmonary fibrosis.

## 2021-11-17 NOTE — ED Triage Notes (Addendum)
Pt states that he had a fever and chills today, took tylenol for his fever. Denies cough, or further symptoms. Says he had negative covid and flu test.

## 2021-11-17 NOTE — ED Provider Triage Note (Signed)
Emergency Medicine Provider Triage Evaluation Note  TOA MIA , a 81 y.o. male  was evaluated in triage.  Pt complains of fever since earlier today.  Patient seen at [facility] earlier today and advised by PA to report to hospital for further work-up for concern of possible pneumonia not seen on chest x-ray.  I do not have access to the patient's imaging or blood work from his earlier visit today.  Review of Systems  Positive: Fevers, chills, body aches Negative: Nausea, vomiting, diarrhea  Physical Exam  BP (!) 100/56 (BP Location: Left Arm)    Pulse 85    Temp 99 F (37.2 C) (Oral)    Resp 19    Ht 5\' 9"  (1.753 m)    Wt 74.4 kg    SpO2 96%    BMI 24.22 kg/m  Gen:   Awake, no distress   Resp:  Normal effort  MSK:   Moves extremities without difficulty  Other:    Medical Decision Making  Medically screening exam initiated at 7:39 PM.  Appropriate orders placed.  SHUAN STATZER was informed that the remainder of the evaluation will be completed by another provider, this initial triage assessment does not replace that evaluation, and the importance of remaining in the ED until their evaluation is complete.     Azucena Cecil, PA-C 11/17/21 1940

## 2021-11-17 NOTE — Telephone Encounter (Signed)
Posed a general question to Sharene Butters of neurology about patient ability to comprehend research consent an dshe replied 11/17/2021     "The patient has mild neurocognitive disorder, he is not currently on any antidementia medication.  He does have however significant decreased hearing, he does not like to wear hearing aids, which hinders his comprehension level.  However he was able to understand my commands without any difficulty.  He has Parkinson's disease, but well controlled with Sinemet. Hid thought process was tangential, but  appropriate without  concerns identified.  I think he should be able to understand the research consent if he wears his hearing aids.  I hope this helps. Have a great day!"     SIGNATURE    Dr. Brand Males, M.D., F.C.C.P,  Pulmonary and Critical Care Medicine Staff Physician, [facility] Director - Interstitial Lung Disease  Program  Pulmonary [place] at [place], [place], [address]  NPI Number:  NPI #[id number] [facility] Number: [id number]  Pager: [phone], If no answer  -> Check AMION or Try [place] Telephone (clinical office): [phone] Telephone (research): [phone]  9:41 AM 11/17/2021 '

## 2021-11-18 ENCOUNTER — Telehealth: Payer: Self-pay | Admitting: Internal Medicine

## 2021-11-18 NOTE — ED Notes (Signed)
Pt visitor informed this EMT that they were no longer going to wait and are going to try and follow up With his PCP. Pt moved OTF

## 2021-11-18 NOTE — Telephone Encounter (Signed)
Lmtcb for pt.  

## 2021-11-18 NOTE — Telephone Encounter (Signed)
Called and spoke with patient's wife. She stated that the patient has had a fever for the past 2-3 days. He also has had chills so bad to the point that his entire body will shake. She took him the emergency room yesterday and stayed for 11 hrs without seeing anyone. They did perform a covid test, labs and CXR but they never heard the results.   She described his cough as non-productive.   [facility]arm[facility] [place].   Dr. Vaughan Browner, can you please advise? Thanks!

## 2021-11-18 NOTE — Telephone Encounter (Signed)
I reviewed the ED tests Chest x ray looks stable. HIs WBC count and creatinine are up  Tell him to stay well hydrated Call in z pack and prednisone 40mg /day for 5 days. Arrange follow up visit in office with me or APP next week for repeat labs

## 2021-11-19 MED ORDER — PREDNISONE 20 MG PO TABS: 40.0000 mg | ORAL_TABLET | Freq: Every day | ORAL | 0 refills | Status: AC

## 2021-11-19 MED ORDER — AZITHROMYCIN 250 MG PO TABS: ORAL_TABLET | ORAL | 0 refills | Status: DC

## 2021-11-19 NOTE — Telephone Encounter (Signed)
LMTCB at the mobile number provided  Also called home number- line rang once and went to fast busy signal  Will try again later

## 2021-11-19 NOTE — Telephone Encounter (Signed)
Called and spoke with patient about the medications Dr. Vaughan Browner suggested yesterday. He verbalized understanding and wishes to have them [facility] [place].   RX have been sent.   Nothing further needed at time of call.

## 2021-11-22 ENCOUNTER — Telehealth: Payer: Self-pay | Admitting: Pulmonary Disease

## 2021-11-22 MED ORDER — MOLNUPIRAVIR EUA 200MG CAPSULE: 4.0000 | ORAL_CAPSULE | Freq: Two times a day (BID) | ORAL | 0 refills | Status: AC

## 2021-11-22 NOTE — Telephone Encounter (Signed)
5-day course molnupiravir 4 tab twice daily sent to local pharmacy.

## 2021-11-22 NOTE — Telephone Encounter (Signed)
[name] is not listed on DPR.   Received verbal from patient to speak with [name]. Recent ED visit 11/17/2021 for chills and fever. Patient left before being seen.  [name] stated that patient tested positive today. Patient is currently experiencing prod cough with green sputum. Sob is baseline.  Denied f/c/s, wheezing or additional sx.  Patient is currently taking prednisone 20mg  and zpak. He will complete course in 2 days.  No current inhaler.   Dr. Silas Flood, please advise. Dr. Vaughan Browner is unavailable.

## 2021-11-22 NOTE — Telephone Encounter (Signed)
Called and spoke with [name].  Dr. Kavin Leech recommendations given.  Understanding stated.  Nothing further at this time.

## 2021-11-24 ENCOUNTER — Ambulatory Visit: Payer: Medicare PPO | Admitting: Pulmonary Disease

## 2021-12-02 DIAGNOSIS — R0683 Snoring: Secondary | ICD-10-CM | POA: Diagnosis not present

## 2021-12-02 DIAGNOSIS — G4730 Sleep apnea, unspecified: Secondary | ICD-10-CM | POA: Diagnosis not present

## 2021-12-06 ENCOUNTER — Ambulatory Visit: Payer: Medicare PPO | Admitting: Pulmonary Disease

## 2021-12-06 ENCOUNTER — Encounter: Payer: Self-pay | Admitting: Pulmonary Disease

## 2021-12-06 ENCOUNTER — Other Ambulatory Visit: Payer: Self-pay

## 2021-12-06 VITALS — BP 124/60 | HR 84 | Temp 97.8°F | Ht 69.0 in | Wt 168.6 lb

## 2021-12-06 DIAGNOSIS — Z5181 Encounter for therapeutic drug level monitoring: Secondary | ICD-10-CM

## 2021-12-06 DIAGNOSIS — J84112 Idiopathic pulmonary fibrosis: Secondary | ICD-10-CM | POA: Diagnosis not present

## 2021-12-06 NOTE — Addendum Note (Signed)
Addended by: Elton Sin on: 12/06/2021 03:20 PM   Modules accepted: Orders

## 2021-12-06 NOTE — Progress Notes (Signed)
Matthew Liu    [id number]    [birth year]  Primary Care Physician:Schultz, Lora Havens, MD  Referring Physician: Nicoletta Dress, MD [address] [place],  [place] [address]  Chief complaint: Follow-up for interstitial lung disease  HPI: 81 year old with history of hypertension, coronary artery disease, hyperlipidemia, allergies, GERD He had an abnormal x-ray at his primary care with a follow-up CT showing UIP pattern pulmonary fibrosis at [facility] and has been referred here for further evaluation Complains of mild dyspnea on exertion.  No cough or fevers or chills  He is also being worked up for sleep apnea.  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  No feather pillows or comforter ILD questionnaire 08/26/2021-negative Smoking history: Never smoker Travel history: No significant travel history Relevant family history: No family history of lung disease  Interim history: Being evaluated for clinical trials He has discussed antifibrotic therapy with his family and is willing to proceed States that breathing is stable with no new issues  He has been started on CPAP for sleep apnea and had an overnight oximetry done.  Follows with Dr. Radford Pax for management of OSA  Outpatient Encounter Medications as of 12/06/2021  Medication Sig   acetaminophen (TYLENOL) 500 MG tablet Take 1 tablet (500 mg total) by mouth every 6 (six) hours as needed for mild pain or headache.   ALPRAZolam (XANAX) 0.25 MG tablet Take 0.25 mg by mouth 2 (two) times daily as needed for anxiety.    atorvastatin (LIPITOR) 80 MG tablet Take 1 tablet (80 mg total) by mouth every evening.   carbidopa-levodopa (SINEMET IR) 25-100 MG tablet Take 1 tablet by mouth 3 (three) times daily. Take 1 tablet three times daily    Cholecalciferol (VITAMIN D3) 50 MCG (2000 UT) capsule Take 2,000 Units by mouth daily.   clopidogrel (PLAVIX) 75 MG tablet TAKE 1 TABLET BY MOUTH EVERY DAY   FLUoxetine (PROZAC) 40 MG capsule Take by mouth.   hydrochlorothiazide (MICROZIDE) 12.5 MG capsule TAKE 1 CAPSULE BY MOUTH EVERY DAY   lisinopril (ZESTRIL) 40 MG tablet Take 20 mg by mouth at bedtime.    metoprolol tartrate (LOPRESSOR) 25 MG tablet Take 0.5 tablets (12.5 mg total) by mouth 2 (two) times daily.   mirtazapine (REMERON) 7.5 MG tablet Take 7.5 mg by mouth at bedtime.   Multiple Vitamins-Minerals (ONE-A-DAY MENS 50+) TABS Take 1 tablet by mouth daily with breakfast.   pantoprazole (PROTONIX) 40 MG tablet Take 1 tablet (40 mg total) by mouth daily.   [DISCONTINUED] azithromycin (ZITHROMAX) 250 MG tablet Take 2 tablets on first day, then 1 tablet daily until finished.   No facility-administered encounter medications on file as of 12/06/2021.   Physical Exam: Blood pressure 124/60, pulse 84, temperature 97.8 F (36.6 C), temperature source Oral, height 5\' 9"  (1.753 m), weight 168 lb 9.6 oz (76.5 kg), SpO2 97 %. Gen:      No acute distress HEENT:  EOMI, sclera anicteric Neck:     No masses; no thyromegaly Lungs:    Clear to auscultation bilaterally; normal respiratory effort CV:         Regular rate and rhythm; no murmurs Abd:      + bowel sounds; soft, non-tender; no palpable masses, no distension Ext:  No edema; adequate peripheral perfusion Skin:      Warm and dry; no rash Neuro: alert and oriented x 3 Psych: normal mood and affect   Data Reviewed: Imaging: CT chest 01/08/2018- basal predominant traction bronchiectasis, reticulation High-res CT 06/23/2021-mild progression of basilar predominant fibrotic changes with minimal honeycombing in the right middle lobe base.  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  High suspicion for IPF He does not have significant exposures or signs and symptoms of connective tissue disease, CTD serologies are negative  We discussed his diagnosis and trajectory of disease.  We reviewed treatment options such as antifibrotics to slow down the progression of disease.    After discussion today we have elected to start him on Ofev Check LFTs  OSA On CPAP.  Managed by Dr. Radford Pax, cardiology  Plan/Recommendations: Start Ofev, check LFTs  Marshell Garfinkel MD [facility] 12/06/2021, 2:46 PM  CC: Nicoletta Dress, MD

## 2021-12-06 NOTE — Patient Instructions (Signed)
We will check labs today including liver function test Start you on a medication called Ofev for treatment of lung scarring Follow-up in 3 months

## 2021-12-07 LAB — HEPATIC FUNCTION PANEL
ALT: 11 U/L (ref 0–53)
AST: 28 U/L (ref 0–37)
Albumin: 4 g/dL (ref 3.5–5.2)
Alkaline Phosphatase: 95 U/L (ref 39–117)
Bilirubin, Direct: 0.1 mg/dL (ref 0.0–0.3)
Total Bilirubin: 0.6 mg/dL (ref 0.2–1.2)
Total Protein: 7.1 g/dL (ref 6.0–8.3)

## 2021-12-08 ENCOUNTER — Telehealth: Payer: Medicare PPO

## 2021-12-08 NOTE — Telephone Encounter (Signed)
Received New start paperwork for OFEV. Will update as we work through the benefits process. ? ?Submitted a Prior Authorization request to [facility] for OFEV via CoverMyMeds. Will update once we receive a response. ? ? ?[id number] ?

## 2021-12-13 ENCOUNTER — Encounter: Payer: Medicare PPO | Admitting: Adult Health

## 2021-12-14 ENCOUNTER — Encounter: Payer: Self-pay | Admitting: *Deleted

## 2021-12-21 ENCOUNTER — Telehealth: Payer: Self-pay

## 2021-12-21 NOTE — Telephone Encounter (Signed)
? ?  Pre-operative Risk Assessment  ?  ?Patient Name: Matthew Liu  ?DOB: 1941-08-16 ?MRN: [id number]  ? ?  ? ?Request for Surgical Clearance   ? ?Procedure:   LESI-L3 and L4  ? ?Date of Surgery:  Clearance TBD                              ?   ?Surgeon:  Dr. Lenord Carbo  ?Surgeon's Group or Practice Name:  [facility]  ?Phone number:  [phone] ext [phone] ?Fax number:  [phone] ?  ?Type of Clearance Requested:   ?- Pharmacy:  Hold Clopidogrel (Plavix) 7 days after procedure  ?  ?Type of Anesthesia:  Not Indicated ?  ?Additional requests/questions:     ? ?  ? ?

## 2021-12-21 NOTE — Telephone Encounter (Addendum)
? ?  Name: Matthew Liu  ?DOB: 03-14-1941  ?MRN: [id number]  ? ?Primary Cardiologist: Sinclair Grooms, MD ? ?Chart reviewed as part of pre-operative protocol coverage. Patient was contacted 12/21/2021 in reference to pre-operative risk assessment for pending surgery as outlined below.  Matthew Liu was last seen on 03/01/2021 by Dr. Daneen Schick.  Since that day, Matthew Liu has done well without exertional chest pain or worsening dyspnea. ? ?Therefore, based on ACC/AHA guidelines, the patient would be at acceptable risk for the planned procedure without further cardiovascular testing.  ? ?Patient previously held Plavix for 7 days prior to back injection in late 2021, the recommendation is the same this time as well.  He can hold Plavix for 7 days prior to back injection and restart as soon as possible afterward at the surgeon's discretion. ? ?The patient was advised that if he develops new symptoms prior to surgery to contact our office to arrange for a follow-up visit, and he verbalized understanding. ? ?I will route this recommendation to the requesting party via Epic fax function and remove from pre-op pool. Please call with questions. ? ?Almyra Deforest, [place] ?12/21/2021, 6:10 PM ? ?

## 2021-12-22 ENCOUNTER — Other Ambulatory Visit (HOSPITAL_COMMUNITY): Payer: Self-pay

## 2021-12-22 ENCOUNTER — Ambulatory Visit: Payer: Medicare PPO | Admitting: Pulmonary Disease

## 2021-12-22 NOTE — Telephone Encounter (Signed)
Submitted Patient Assistance Application to [employer] for OFEV along with provider portion, PA and income documents. Will update patient when we receive a response.  Fax# [phone] Phone# [phone] 

## 2021-12-22 NOTE — Telephone Encounter (Signed)
PA request form received and completed. Faxed back to [facility] along with supporting chart notes. Will await determination. Per rep, turnaround time can be up to 72 hours. ? ?Fax# [phone] ?Phone# [phone] ?

## 2021-12-22 NOTE — Telephone Encounter (Signed)
Questions have still not populated in [facility], contacted [employer] help desk and requested PA form be faxed to office. Direct fax number provided, will await their delivery. ?

## 2021-12-22 NOTE — Telephone Encounter (Signed)
Received notification from [facility] regarding a prior authorization for [place]. Authorization is not required through pt's plan  ? ?Per test claim, copay for 30 days supply is $100 ? ?Patient can fill through [employer]: [phone]  ? ? ?We still have not received pt's income documentation at this time, however all other associated documentation for PAP has been collected and is placed in PAP pending folder. ATC pt to discuss, LVM and provided brief message and direct callback number. Will await return call. ?

## 2021-12-24 ENCOUNTER — Telehealth: Payer: Self-pay | Admitting: Physician Assistant

## 2021-12-24 MED ORDER — CARBIDOPA-LEVODOPA 25-100 MG PO TABS: 1.0000 | ORAL_TABLET | Freq: Three times a day (TID) | ORAL | 0 refills | Status: DC

## 2021-12-24 NOTE — Telephone Encounter (Signed)
Patient LM with AN. He needs to speak with someone regarding his RX. Called back and no answer ?

## 2021-12-24 NOTE — Telephone Encounter (Signed)
Refill sent in to last until pt appointment to was called an informed that refill was sent to [facility] on [address] , ?

## 2022-01-11 ENCOUNTER — Ambulatory Visit: Payer: Medicare PPO | Admitting: Cardiology

## 2022-01-11 ENCOUNTER — Encounter: Payer: Self-pay | Admitting: Cardiology

## 2022-01-11 VITALS — BP 104/60 | HR 70 | Ht 69.0 in | Wt 170.4 lb

## 2022-01-11 DIAGNOSIS — G4733 Obstructive sleep apnea (adult) (pediatric): Secondary | ICD-10-CM

## 2022-01-11 DIAGNOSIS — I10 Essential (primary) hypertension: Secondary | ICD-10-CM | POA: Diagnosis not present

## 2022-01-11 NOTE — Patient Instructions (Signed)

## 2022-01-11 NOTE — Progress Notes (Signed)
?Cardiology Office Note:   ? ?Date:  01/11/2022  ? ?ID:  Matthew Liu, DOB 07-May-1941, MRN [id number] ? ?PCP:  Matthew Dress, MD  ?Cardiologist:  Matthew Grooms, MD   ? ?Referring MD: Matthew Dress, MD  ? ?Chief Complaint  ?Patient presents with  ? Sleep Apnea  ? Hypertension  ? ? ?History of Present Illness:   ? ?Matthew Liu is a 81 y.o. male with a hx of ASCHD, hyperlipidemia, GERD, hypertension who was referred for sleep study by Dr. Tamala Liu. He was complaining of excessive daytime sleepiness as well as lethargy and making noises while he was asleep.  He underwent sleep study which showed Severe obstructive sleep apnea with an AHI of 40.4/h and oxygen desaturations as low as 84%.  He underwent CPAP titration and was ultimately placed on auto CPAP from 4 to 15 cm H2O.  He was also found to have nocturnal hypoxemia on CPAP. ? ?He is doing well with his CPAP device and thinks that he has gotten used to it.  He tolerates the FFM mask and feels the pressure is adequate.  Since going on CPAP he feels rested in the am and has no significant daytime sleepiness.  He denies any significant mouth or nasal dryness or nasal congestion.  He does not think that he snores.  ? ?Past Medical History:  ?Diagnosis Date  ? Acute ischemic stroke ([place]) 09/15/2015  ? Ankle fracture, left 2009  ? rod placed  ? Anxiety   ? Aspirin allergy   ? a. Anaphylactic rxn.  ? Barrett's esophagus   ? CAD (coronary artery disease)   ? a. Inf STEMI 07/2013: s/p DES x 2 from ostial to [facility] 07/13/13. Residual Cx disease for med rx (PCI if recurrent pain).  ? Controlled gout   ? Dyslipidemia   ? ED (erectile dysfunction)   ? Edema of both legs   ? Gastric polyp   ? GERD (gastroesophageal reflux disease)   ? Hiatal hernia   ? History of nuclear stress test   ? Nuclear stress test 5/19: EF 53, no ischemia, no infarction; Low Risk  ? Hydrocele of testis   ? Hyperglycemia   ? a. A1C 5.8 in 07/2013.  ? Hypertension   ? Myocardial infarction [facility])    ? OSA (obstructive sleep apnea)   ? Severe obstructive sleep apnea with an AHI of 40.4/h on auto CPAP  ? 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 LIMA to LAD, SVG to Diag, Sequential SVG to OM2-OM3, SVG to RCA, EVH via right thigh and leg  ? ? ?Past Surgical History:  ?Procedure Laterality Date  ? BACK SURGERY    ? L1 kyphoplasty by Dr Trenton Gammon 03/2018     thoractic [phone]  ? cataract surgery     ? bilateral   ? COLONOSCOPY    ? CORONARY ANGIOPLASTY    ? stents   ? CORONARY ARTERY BYPASS GRAFT N/A 12/12/2018  ? Procedure: CORONARY ARTERY BYPASS GRAFTING (CABG);  Surgeon: Matthew Alberts, MD;  Location: [place];  Service: Open Heart Surgery;  Laterality: N/A;  ? ESOPHAGOGASTRODUODENOSCOPY  04/04/2016  ? Short-segment Barrett's esophagus (biopsied) Gastric polyps (status post polypectomy x1) Hiatal hernia  ? HYDROCELE EXCISION Left 07/17/2014  ? Procedure: LEFT HYDROCELECTOMY;  Surgeon: Matthew So, MD;  Location: [facility];  Service: Urology;  Laterality: Left;  ? left ankle surgery  1999  ? LEFT HEART CATH AND CORONARY ANGIOGRAPHY N/A 12/11/2018  ?  Procedure: LEFT HEART CATH AND CORONARY ANGIOGRAPHY;  Surgeon: Matthew Crome, MD;  Location: [place] CV [facility];  Service: Cardiovascular;  Laterality: N/A;  ? LEFT HEART CATHETERIZATION WITH CORONARY ANGIOGRAM N/A 07/13/2013  ? Procedure: LEFT HEART CATHETERIZATION WITH CORONARY ANGIOGRAM;  Surgeon: Matthew Grooms, MD;  Location: [facility] CATH LAB;  Service: Cardiovascular;  Laterality: N/A;  ? PERCUTANEOUS CORONARY STENT INTERVENTION (PCI-S)  07/13/2013  ? Procedure: PERCUTANEOUS CORONARY STENT INTERVENTION (PCI-S);  Surgeon: Matthew Grooms, MD;  Location: [facility] CATH LAB;  Service: Cardiovascular;;  ? TEE WITHOUT CARDIOVERSION N/A 12/12/2018  ? Procedure: TRANSESOPHAGEAL ECHOCARDIOGRAM (TEE);  Surgeon: Matthew Alberts, MD;  Location: [place];  Service: Open Heart Surgery;  Laterality: N/A;  ? TONSILLECTOMY    ? ? ?Current Medications: ?Current Meds  ?Medication Sig  ? acetaminophen (TYLENOL) 500 MG  tablet Take 1 tablet (500 mg total) by mouth every 6 (six) hours as needed for mild pain or headache.  ? ALPRAZolam (XANAX) 0.25 MG tablet Take 0.25 mg by mouth 2 (two) times daily as needed for anxiety.   ? atorvastatin (LIPITOR) 80 MG tablet Take 1 tablet (80 mg total) by mouth every evening.  ? carbidopa-levodopa (SINEMET IR) 25-100 MG tablet Take 1 tablet by mouth 3 (three) times daily. Take 1 tablet three times daily  ? Cholecalciferol (VITAMIN D3) 50 MCG (2000 UT) capsule Take 2,000 Units by mouth daily.  ? clopidogrel (PLAVIX) 75 MG tablet TAKE 1 TABLET BY MOUTH EVERY DAY  ? FLUoxetine (PROZAC) 40 MG capsule Take by mouth.  ? hydrochlorothiazide (MICROZIDE) 12.5 MG capsule TAKE 1 CAPSULE BY MOUTH EVERY DAY  ? lisinopril (ZESTRIL) 40 MG tablet Take 20 mg by mouth at bedtime.   ? metoprolol tartrate (LOPRESSOR) 25 MG tablet Take 0.5 tablets (12.5 mg total) by mouth 2 (two) times daily.  ? mirtazapine (REMERON) 7.5 MG tablet Take 7.5 mg by mouth at bedtime.  ? Multiple Vitamins-Minerals (ONE-A-DAY MENS 50+) TABS Take 1 tablet by mouth daily with breakfast.  ? pantoprazole (PROTONIX) 40 MG tablet Take 1 tablet (40 mg total) by mouth daily.  ?  ? ?Allergies:   Aspirin and Quinine derivatives  ? ?Social History  ? ?Socioeconomic History  ? Marital status: Married  ?  Spouse name: [name]  ? Number of children: 2  ? Years of education: 66  ? Highest education level: Not on file  ?Occupational History  ? Occupation: Contractor  ?Tobacco Use  ? Smoking status: Never  ? Smokeless tobacco: Never  ?Vaping Use  ? Vaping Use: Never used  ?Substance and Sexual Activity  ? Alcohol use: Yes  ?  Comment: occasional beer   ? Drug use: No  ? Sexual activity: Not Currently  ?Other Topics Concern  ? Not on file  ?Social History Narrative  ? Lives with wife  ? Caffeine use: 1-2 cups coffee per day  ? Right handed  ? ?Social Determinants of Health  ? ?Financial Resource Strain: Not on file  ?Food Insecurity:  Not on file  ?Transportation Needs: Not on file  ?Physical Activity: Not on file  ?Stress: Not on file  ?Social Connections: Not on file  ?  ? ?Family History: ?The patient's family history includes Heart failure in his father; Hypertension in his mother; Ovarian cancer in his mother. There is no history of Stroke. ? ?ROS:   ?Please see the history of present illness.    ?ROS  ?All other systems reviewed and  negative.  ? ?EKGs/Labs/Other Studies Reviewed:   ? ?The following studies were reviewed today: ?PSG, CPAP titration, PAP compliance downlaod ? ?EKG:  EKG is not ordered today.   ? ?Recent Labs: ?11/17/2021: BUN 29; Creatinine, Ser 1.41; Hemoglobin 11.5; Platelets 229; Potassium 4.4; Sodium 136 ?12/06/2021: ALT 11  ? ?Recent Lipid Panel ?   ?Component Value Date/Time  ? CHOL 108 03/20/2017 1331  ? TRIG 87 03/20/2017 1331  ? HDL 45 03/20/2017 1331  ? CHOLHDL 2.4 03/20/2017 1331  ? CHOLHDL 2.7 09/15/2015 0205  ? VLDL 22 09/15/2015 0205  ? LDLCALC 46 03/20/2017 1331  ? ? ?Physical Exam:   ? ?VS:  BP 104/60   Pulse 70   Ht '5\' 9"'$  (1.753 m)   Wt 170 lb 6.4 oz (77.3 kg)   SpO2 97%   BMI 25.16 kg/m?    ? ?Wt Readings from Last 3 Encounters:  ?01/11/22 170 lb 6.4 oz (77.3 kg)  ?12/06/21 168 lb 9.6 oz (76.5 kg)  ?11/17/21 164 lb (74.4 kg)  ?  ? ?GEN:  Well nourished, well developed in no acute distress ?HEENT: Normal ?NECK: No JVD; No carotid bruits ?LYMPHATICS: No lymphadenopathy ?CARDIAC: RRR, no murmurs, rubs, gallops ?RESPIRATORY:  Clear to auscultation without rales, wheezing or rhonchi  ?ABDOMEN: Soft, non-tender, non-distended ?MUSCULOSKELETAL:  No edema; No deformity  ?SKIN: Warm and dry ?NEUROLOGIC:  Alert and oriented x 3 ?PSYCHIATRIC:  Normal affect  ? ?ASSESSMENT:   ? ?1. OSA (obstructive sleep apnea)   ?2. Primary hypertension   ? ?PLAN:   ? ?In order of problems listed above: ? ? OSA - The patient is tolerating PAP therapy well without any problems. The PAP download performed by his DME was personally  reviewed and interpreted by me today and showed an AHI of 6.9/hr on auto CPAP cm H2O with 100% compliance in using more than 4 hours nightly.  The patient has been using and benefiting from PAP use and will con

## 2022-01-16 ENCOUNTER — Other Ambulatory Visit: Payer: Self-pay | Admitting: Physician Assistant

## 2022-01-17 ENCOUNTER — Telehealth: Payer: Self-pay | Admitting: *Deleted

## 2022-01-17 DIAGNOSIS — G4733 Obstructive sleep apnea (adult) (pediatric): Secondary | ICD-10-CM

## 2022-01-17 NOTE — Telephone Encounter (Signed)
-----   Message from Antonieta Iba, RN sent at 01/11/2022  2:07 PM EDT ----- ?Per Dr. Radford Pax:  ?Please order patient new CPAP supplies.  ?Thanks! ? ?

## 2022-01-17 NOTE — Telephone Encounter (Signed)
Order placed to [employer] via community message. 

## 2022-01-25 ENCOUNTER — Ambulatory Visit: Payer: Medicare PPO | Admitting: Physician Assistant

## 2022-01-25 ENCOUNTER — Encounter: Payer: Self-pay | Admitting: Physician Assistant

## 2022-01-25 VITALS — BP 147/85 | HR 67 | Resp 18 | Wt 167.0 lb

## 2022-01-25 DIAGNOSIS — F0670 Mild neurocognitive disorder due to known physiological condition without behavioral disturbance: Secondary | ICD-10-CM

## 2022-01-25 DIAGNOSIS — G20 Parkinson's disease: Secondary | ICD-10-CM

## 2022-01-25 MED ORDER — CARBIDOPA-LEVODOPA 25-100 MG PO TABS: 1.0000 | ORAL_TABLET | Freq: Three times a day (TID) | ORAL | 3 refills | Status: DC

## 2022-01-25 NOTE — Progress Notes (Signed)
? ?Assessment/Plan:  ? ?Mild neurocognitive disorder due to Parkinson's disease ? ?Patient is currently on carbidopa levodopa 25-100 3 times daily, with good control of her tremors.  Memory is stable as well, today's MMSE is 30/30, improved from prior.  He is not on antidementia medication.  He is now using hearing aids which appear to contribute to cognitive improvement.  He is also using his CPAP at night. ? ?Recommendations:  ?Discussed safety both in and out of the home.  ?Discussed the importance of regular daily schedule with inclusion of crossword puzzles to maintain brain function.  ?Continue to monitor mood by PCP ?Stay active at least 30 minutes at least 3 times a week.  ?Naps should be scheduled and should be no longer than 60 minutes and should not occur after 2 PM.  ?Mediterranean diet is recommended  ?Anti Dementia medication is not indicated at this time.  We will continue to monitor ?Continue using the CPAP and hearing aids ?Follow up in 6 months. ? ?Parkinson disease ?Patient is tolerating Sinemet, no new areas of tremors, no worsening of tremors. ?Continue carbidopa levodopa 25 100 3 times daily ? ? ?Case discussed with Dr. Delice Lesch who agrees with the plan ? ? ?Subjective:  ? ? ?Matthew Liu is a 81 y.o. right-handed male with risk factors including hypertension, hyperlipidemia, status post ischemic stroke, sleep apnea not on CPAP, CAD status post CABG, anxiety, very hard of hearing, seen today in follow up for memory loss.  He was last seen on 07/05/2021.  Last MoCA on 03/18/2021 was 22/30.  Prior records as well as any outside records available were reviewed prior to today's visit.  Neurocognitive evaluation on 07/05/2021, which noted a tangential thought process in the setting of major hearing problems.  However, this thought content was found to be appropriate. ?In today's visit, the patient reports that his memory is about the same.  However, he is a very difficult historian, and on multiple  occasions he becomes tangential in his answers.  Of note, and neuropsychological testing showed an isolated area of low performance on processing speed but without suggestion of storage problem.  He is now using hearing aids.  He denies any depression, irritability, hallucinations or paranoia.  He becomes more irritable when talking about his wife, he does not enjoy bringing her to the appointments.  "I left her going shopping, I just want to be alone on my appointment ". He sleeps well, denies vivid dreams or sleepwalking, takes Remeron regularly.  He is independent of bathing and dressing.  Denies missing medications and is very organized.  Denies leaving objects in unusual places, he takes care of his own finances, does not miss a payment.  Appetite is good, denies trouble swallowing.  No excessive salivation.  He cooks and denies leaving the stove or the faucet on.  Occasionally he falls, does not like to walk much because he is afraid of falling.  He does not like to use a walker or a cane.  He continues to drive without GPS, denies getting lost.  As for his tremors, they are improved since starting Sinemet, which he is tolerating well.  He has right greater than left tremor, but overall, he is able to grab objects without dropping them.  Denies headaches, double vision, dizziness, focal numbness or tingling, unilateral weakness or tremors or anosmia. No history of seizures. Denies urine incontinence, retention, constipation or diarrhea. ? ? PREVIOUS MEDICATIONS:  ? ?CURRENT MEDICATIONS:  ?Outpatient Encounter Medications as  of 01/25/2022  ?Medication Sig  ? acetaminophen (TYLENOL) 500 MG tablet Take 1 tablet (500 mg total) by mouth every 6 (six) hours as needed for mild pain or headache.  ? ALPRAZolam (XANAX) 0.25 MG tablet Take 0.25 mg by mouth 2 (two) times daily as needed for anxiety.   ? atorvastatin (LIPITOR) 80 MG tablet Take 1 tablet (80 mg total) by mouth every evening.  ? Cholecalciferol (VITAMIN D3)  50 MCG (2000 UT) capsule Take 2,000 Units by mouth daily.  ? clopidogrel (PLAVIX) 75 MG tablet TAKE 1 TABLET BY MOUTH EVERY DAY  ? FLUoxetine (PROZAC) 40 MG capsule Take by mouth.  ? hydrochlorothiazide (MICROZIDE) 12.5 MG capsule TAKE 1 CAPSULE BY MOUTH EVERY DAY  ? lisinopril (ZESTRIL) 40 MG tablet Take 20 mg by mouth at bedtime.   ? metoprolol tartrate (LOPRESSOR) 25 MG tablet Take 0.5 tablets (12.5 mg total) by mouth 2 (two) times daily.  ? mirtazapine (REMERON) 7.5 MG tablet Take 7.5 mg by mouth at bedtime.  ? Multiple Vitamins-Minerals (ONE-A-DAY MENS 50+) TABS Take 1 tablet by mouth daily with breakfast.  ? pantoprazole (PROTONIX) 40 MG tablet Take 1 tablet (40 mg total) by mouth daily.  ? [DISCONTINUED] carbidopa-levodopa (SINEMET IR) 25-100 MG tablet Take 1 tablet by mouth 3 (three) times daily. Take 1 tablet three times daily  ? carbidopa-levodopa (SINEMET IR) 25-100 MG tablet Take 1 tablet by mouth 3 (three) times daily. Take 1 tablet three times daily  ? ?No facility-administered encounter medications on file as of 01/25/2022.  ? ? ? ?Objective:  ?  ? ?PHYSICAL EXAMINATION:   ? ?VITALS:   ?Vitals:  ? 01/25/22 1310  ?BP: (!) 147/85  ?Pulse: 67  ?Resp: 18  ?SpO2: 93%  ?Weight: 167 lb (75.8 kg)  ? ? ? ?GEN:  The patient appears stated age and is in NAD. ?HEENT:  Normocephalic, atraumatic.  ? ?Neurological examination: ? ?General: NAD, well-groomed, appears stated age.  Flat affect. ?Orientation: He is alert to person, place and time.   ?Cranial nerves: There is good facial symmetry.The speech is fluent and clear but does take time to answer the questions.  No aphasia or dysarthria. Fund of knowledge is appropriate. Recent memory is impaired, but remote is intact.  Attention and concentration are reduced.  Able to name objects and repeat phrases.  Hearing is reduced to conversational tone, needs devices.[name]    ?Sensation: Sensation is intact to light touch throughout ?Motor: Strength is at least antigravity  x4. ?Tremors: Improved from prior exam, right greater than left ?Tone: Increased tone on the  left arm, with cogwheeling. ?DTR's 2/4 in UE/LE  ? ?  ? ?  03/18/2021  ?  3:00 PM  ?Montreal Cognitive Assessment   ?Visuospatial/ Executive (0/5) 3  ?Naming (0/3) 3  ?Attention: Read list of digits (0/2) 2  ?Attention: Read list of letters (0/1) 1  ?Attention: Serial 7 subtraction starting at 100 (0/3) 3  ?Language: Repeat phrase (0/2) 1  ?Language : Fluency (0/1) 0  ?Abstraction (0/2) 0  ?Delayed Recall (0/5) 3  ?Orientation (0/6) 5  ?Total 21  ?Adjusted Score (based on education) 22  ? ? ?  01/25/2022  ?  8:00 PM  ?MMSE - Mini Mental State Exam  ?Orientation to time 5  ?Orientation to Place 5  ?Registration 3  ?Attention/ Calculation 5  ?Recall 3  ?Language- name 2 objects 2  ?Language- repeat 1  ?Language- follow 3 step command 3  ?Language- read & follow direction  1  ?Write a sentence 1  ?Copy design 1  ?Total score 30  ?  ?   ? View : No data to display.  ?  ?  ?  ?  ? ?  ?Movement examination: ?Tone: There is normal tone in the UE/LE ?Abnormal movements: Tremors as above, no myoclonus.  No asterixis.   ?Coordination:  There is no decremation with RAM's. Normal finger to nose  ?Gait and Station: The patient has no difficulty arising out of a deep-seated chair without the use of the hands. The patient's stride length is good.  Gait is cautious mildly wider based than prior.  Right arm with decreased swing. ? ? ? Total time spent on today's visit was  30 minutes, including both face-to-face time and nonface-to-face time. Time included that spent on review of records (prior notes available to me/labs/imaging if pertinent), discussing treatment and goals, answering patient's questions and coordinating care. ? ?Cc:  Nicoletta Dress, MD ?Sharene Butters, PA-C   ?

## 2022-01-25 NOTE — Patient Instructions (Addendum)
It was a pleasure to see you today at our office. Everything is stable no new findings   Recommendations:  Follow up in  6 months Continue Carbidopa Levodopa as prescribed, no changes  Continue with your other medicines  Use your hearing aids!!!    RECOMMENDATIONS FOR ALL PATIENTS WITH MEMORY PROBLEMS: 1. Continue to exercise (Recommend 30 minutes of walking everyday, or 3 hours every week) 2. Increase social interactions - continue going to Church and enjoy social gatherings with friends and family 3. Eat healthy, avoid fried foods and eat more fruits and vegetables 4. Maintain adequate blood pressure, blood sugar, and blood cholesterol level. Reducing the risk of stroke and cardiovascular disease also helps promoting better memory. 5. Avoid stressful situations. Live a simple life and avoid aggravations. Organize your time and prepare for the next day in anticipation. 6. Sleep well, avoid any interruptions of sleep and avoid any distractions in the bedroom that may interfere with adequate sleep quality 7. Avoid sugar, avoid sweets as there is a strong link between excessive sugar intake, diabetes, and cognitive impairment We discussed the Mediterranean diet, which has been shown to help patients reduce the risk of progressive memory disorders and reduces cardiovascular risk. This includes eating fish, eat fruits and green leafy vegetables, nuts like almonds and hazelnuts, walnuts, and also use olive oil. Avoid fast foods and fried foods as much as possible. Avoid sweets and sugar as sugar use has been linked to worsening of memory function.  There is always a concern of gradual progression of memory problems. If this is the case, then we may need to adjust level of care according to patient needs. Support, both to the patient and caregiver, should then be put into place.    FALL PRECAUTIONS: Be cautious when walking. Scan the area for obstacles that may increase the risk of trips and falls.  When getting up in the mornings, sit up at the edge of the bed for a few minutes before getting out of bed. Consider elevating the bed at the head end to avoid drop of blood pressure when getting up. Walk always in a well-lit room (use night lights in the walls). Avoid area rugs or power cords from appliances in the middle of the walkways. Use a walker or a cane if necessary and consider physical therapy for balance exercise. Get your eyesight checked regularly.  FINANCIAL OVERSIGHT: Supervision, especially oversight when making financial decisions or transactions is also recommended.  HOME SAFETY: Consider the safety of the kitchen when operating appliances like stoves, microwave oven, and blender. Consider having supervision and share cooking responsibilities until no longer able to participate in those. Accidents with firearms and other hazards in the house should be identified and addressed as well.   ABILITY TO BE LEFT ALONE: If patient is unable to contact 911 operator, consider using LifeLine, or when the need is there, arrange for someone to stay with patients. Smoking is a fire hazard, consider supervision or cessation. Risk of wandering should be assessed by caregiver and if detected at any point, supervision and safe proof recommendations should be instituted.  MEDICATION SUPERVISION: Inability to self-administer medication needs to be constantly addressed. Implement a mechanism to ensure safe administration of the medications.   DRIVING: Regarding driving, in patients with progressive memory problems, driving will be impaired. We advise to have someone else do the driving if trouble finding directions or if minor accidents are reported. Independent driving assessment is available to determine safety of driving.     If you are interested in the driving assessment, you can contact the following:  [employer] in [place] [phone]  [facility]  [phone]  [facility] [phone]  [facility] [phone] or [phone]   

## 2022-02-04 NOTE — Telephone Encounter (Signed)
Called [employer] for status of patient's [employer] Ofev application. ? ?Received a verbal confirmation from  [facility] regarding an approval for OFEV patient assistance from 02/04/22 to 10/09/22.  ? ?Phone: [phone] ? ?Knox Saliva, PharmD, MPH, BCPS, CPP ?Clinical Pharmacist (Rheumatology and Pulmonology) ?

## 2022-02-07 ENCOUNTER — Telehealth: Payer: Self-pay | Admitting: Pharmacist

## 2022-02-07 DIAGNOSIS — Z5181 Encounter for therapeutic drug level monitoring: Secondary | ICD-10-CM

## 2022-02-07 DIAGNOSIS — J84112 Idiopathic pulmonary fibrosis: Secondary | ICD-10-CM

## 2022-02-07 MED ORDER — OFEV 150 MG PO CAPS: 150.0000 mg | ORAL_CAPSULE | Freq: Two times a day (BID) | ORAL | 1 refills | Status: DC

## 2022-02-07 NOTE — Telephone Encounter (Signed)
Spoke with patient regarding Ofev patient assistance approval. He states that he has not received call from [facility] regarding approval ? ?Knox Saliva, PharmD, MPH, BCPS, CPP ?Clinical Pharmacist (Rheumatology and Pulmonology) ?

## 2022-02-07 NOTE — Telephone Encounter (Signed)
? ?Subjective:  ?Patient called today by [facility] pharmacy team for Ofev new start counseling for IPF. He has PMH significant for MI, HTN, CAD, ischemic and lacunar stroke, OSA.  He has GERD, Parkinson's,  memory loss, dyslipidemia. ? ?History of CAD: Yes ?History of MI: Yes ?Current anticoagulant use: No ?History of HTN: Yes  ?History of elevated LFTs: No ?History of diarrhea, nausea, vomiting: No ? ?Objective: ?Allergies  ?Allergen Reactions  ? Aspirin Hives and Shortness Of Breath  ?  "Shortness of Breath" can't breathe  ? Quinine Derivatives Hives and Other (See Comments)  ?  "looks like he has the measles"  ? ? ?Outpatient Encounter Medications as of 02/07/2022  ?Medication Sig  ? acetaminophen (TYLENOL) 500 MG tablet Take 1 tablet (500 mg total) by mouth every 6 (six) hours as needed for mild pain or headache.  ? ALPRAZolam (XANAX) 0.25 MG tablet Take 0.25 mg by mouth 2 (two) times daily as needed for anxiety.   ? atorvastatin (LIPITOR) 80 MG tablet Take 1 tablet (80 mg total) by mouth every evening.  ? carbidopa-levodopa (SINEMET IR) 25-100 MG tablet Take 1 tablet by mouth 3 (three) times daily. Take 1 tablet three times daily  ? Cholecalciferol (VITAMIN D3) 50 MCG (2000 UT) capsule Take 2,000 Units by mouth daily.  ? clopidogrel (PLAVIX) 75 MG tablet TAKE 1 TABLET BY MOUTH EVERY DAY  ? FLUoxetine (PROZAC) 40 MG capsule Take by mouth.  ? hydrochlorothiazide (MICROZIDE) 12.5 MG capsule TAKE 1 CAPSULE BY MOUTH EVERY DAY  ? lisinopril (ZESTRIL) 40 MG tablet Take 20 mg by mouth at bedtime.   ? metoprolol tartrate (LOPRESSOR) 25 MG tablet Take 0.5 tablets (12.5 mg total) by mouth 2 (two) times daily.  ? mirtazapine (REMERON) 7.5 MG tablet Take 7.5 mg by mouth at bedtime.  ? Multiple Vitamins-Minerals (ONE-A-DAY MENS 50+) TABS Take 1 tablet by mouth daily with breakfast.  ? pantoprazole (PROTONIX) 40 MG tablet Take 1 tablet (40 mg total) by mouth daily.  ? ?No facility-administered encounter medications on  file as of 02/07/2022.  ?  ? ?Immunization History  ?Administered Date(s) Administered  ? Influenza-Unspecified 06/10/2021  ? Moderna Sars-Covid-2 Vaccination 11/05/2019  ?  ? ? ?PFT's ?No results found for: FEV1, FVC, FEV1FVC, TLC, DLCO  ? ? ?CMP  ?   ?Component Value Date/Time  ? NA 136 11/17/2021 2038  ? NA 138 09/02/2020 0844  ? K 4.4 11/17/2021 2038  ? CL 104 11/17/2021 2038  ? CO2 24 11/17/2021 2038  ? GLUCOSE 101 (H) 11/17/2021 2038  ? BUN 29 (H) 11/17/2021 2038  ? BUN 20 09/02/2020 0844  ? CREATININE 1.41 (H) 11/17/2021 2038  ? CALCIUM 9.0 11/17/2021 2038  ? PROT 7.1 12/06/2021 1528  ? PROT 6.6 03/20/2017 1331  ? ALBUMIN 4.0 12/06/2021 1528  ? ALBUMIN 4.3 03/20/2017 1331  ? AST 28 12/06/2021 1528  ? ALT 11 12/06/2021 1528  ? ALKPHOS 95 12/06/2021 1528  ? BILITOT 0.6 12/06/2021 1528  ? BILITOT 1.1 03/20/2017 1331  ? GFRNONAA 50 (L) 11/17/2021 2038  ? GFRAA 81 09/02/2020 0844  ?  ? ? ?CBC ?   ?Component Value Date/Time  ? WBC 12.4 (H) 11/17/2021 2038  ? RBC 3.39 (L) 11/17/2021 2038  ? HGB 11.5 (L) 11/17/2021 2038  ? HGB 12.4 (L) 09/02/2020 0844  ? HCT 34.0 (L) 11/17/2021 2038  ? HCT 35.2 (L) 09/02/2020 0844  ? PLT 229 11/17/2021 2038  ? PLT 157 09/02/2020 0844  ? MCV  100.3 (H) 11/17/2021 2038  ? MCV 101 (H) 09/02/2020 0844  ? MCH 33.9 11/17/2021 2038  ? MCHC 33.8 11/17/2021 2038  ? RDW 13.4 11/17/2021 2038  ? RDW 12.8 09/02/2020 0844  ? LYMPHSABS 1.8 11/17/2021 2038  ? MONOABS 0.9 11/17/2021 2038  ? EOSABS 0.1 11/17/2021 2038  ? BASOSABS 0.0 11/17/2021 2038  ?  ? ? ?LFT's ? ?  Latest Ref Rng & Units 12/06/2021  ?  3:28 PM 01/27/2021  ? 11:42 AM 12/11/2018  ?  3:33 PM  ?Hepatic Function  ?Total Protein 6.0 - 8.3 g/dL 7.1   6.7   5.8    ?Albumin 3.5 - 5.2 g/dL 4.0   3.8   3.3    ?AST 0 - 37 U/L _0 ?ALT 0 - 53 U/L _1 ?Alk Phosphatase 39 - 117 U/L 95   87   81    ?Total Bilirubin 0.2 - 1.2 mg/dL 0.6   0.8   0.7    ?Bilirubin, Direct 0.0 - 0.3 mg/dL 0.1      ?  ?CT Chest (11/17/21) ? ?Assessment  and Plan ? ?Ofev Medication Management ?Thoroughly counseled patient on the efficacy, mechanism of action, dosing, administration, adverse effects, and monitoring parameters of Ofev. Patient verbalized understanding. ? ?Patient has history of CAD and CV events - we reviewed the CV risks with Ofev. He also memory concerns so we twice daily dosing is preferred. ? ?I provided him with [facility] phone number and advised him to save the phone number and call this week to set up shipment. ? 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 It will slow the progression of ILD.  ?Inhibits tyrosine kinase inhibitors which slow the fibrosis/progression of ILD ?-Significant reduction in the rate of disease progression was observed after treatment ?(61.1% [before] vs 33.3% [after], P?=?0.008) over 42 weeks. ? ?Dosing: 150 mg (one capsule) by mouth twice daily (approx 12 hours apart). Discussed taking with food approximately 12 hours apart. Discussed that capsule should not be crushed or split. ? ?Adverse Effects: ?Nausea, vomiting, diarrhea (2 in 3 patients) appetite loss, weight loss - management of diarrhea with loperamide discussed including max use of 48 hours and max of 8 capsules per day. ?Abdominal pain (up to 1 in 5 patients) ?Nasopharyngitis (13%), UTI (6%) ?Risk of thrombosis (3%) and acute MI (2%) ?Hypertension (5%) ?Dizziness ?Fatigue (10%) ? ?Monitoring: ?Monitor for diarrhea, nausea and vomiting, GI perforation, hepatotoxicity  ?Monitor LFTs - baseline, monthly for first 6 months, then every 3 months routinely. LFTs on 12/06/21 wnl ?CBC w differential at baseline and every 3 months routinely ? ?Access: ?Approval of Ofev through: patient assistance ?Rx sent to: [facility] for Ofev: [phone] ? ?Medication Reconciliation ?A drug regimen assessment was performed, including review of allergies, interactions, disease-state management, dosing and immunization history. Medications were reviewed with the  patient, including name, instructions, indication, goals of therapy, potential side effects, importance of adherence, and safe use. ? ?Anticoagulant use: No ? ?This appointment required 30 minutes of patient care (this includes precharting, chart review, review of results, face-to-face care, etc.). ? ?Thank you for involving pharmacy to assist in providing this patient's care.  ? ?Knox Saliva, PharmD, MPH, BCPS, CPP ?Clinical Pharmacist (Rheumatology and Pulmonology) ?

## 2022-02-09 DIAGNOSIS — M5416 Radiculopathy, lumbar region: Secondary | ICD-10-CM | POA: Diagnosis not present

## 2022-02-14 NOTE — Telephone Encounter (Signed)
Spoke with patient. He states he received Ofev last week and has been taking it for about 4 days now. ? ?Knox Saliva, PharmD, MPH, BCPS, CPP ?Clinical Pharmacist (Rheumatology and Pulmonology) ?

## 2022-03-01 DIAGNOSIS — M5416 Radiculopathy, lumbar region: Secondary | ICD-10-CM | POA: Diagnosis not present

## 2022-03-03 ENCOUNTER — Ambulatory Visit: Payer: Medicare PPO | Admitting: Pulmonary Disease

## 2022-03-24 ENCOUNTER — Encounter (HOSPITAL_COMMUNITY): Payer: Self-pay

## 2022-03-24 ENCOUNTER — Observation Stay (HOSPITAL_COMMUNITY)
Admission: EM | Admit: 2022-03-24 | Discharge: 2022-03-26 | Disposition: A | Discharge status: Home / Self Care | Payer: Medicare PPO | Attending: Internal Medicine | Admitting: Internal Medicine

## 2022-03-24 ENCOUNTER — Emergency Department (HOSPITAL_COMMUNITY): Payer: Medicare PPO

## 2022-03-24 ENCOUNTER — Telehealth: Payer: Self-pay | Admitting: Pulmonary Disease

## 2022-03-24 ENCOUNTER — Telehealth: Payer: Self-pay | Admitting: Internal Medicine

## 2022-03-24 ENCOUNTER — Other Ambulatory Visit: Payer: Self-pay

## 2022-03-24 DIAGNOSIS — K2270 Barrett's esophagus without dysplasia: Secondary | ICD-10-CM | POA: Diagnosis present

## 2022-03-24 DIAGNOSIS — R5383 Other fatigue: Secondary | ICD-10-CM | POA: Diagnosis not present

## 2022-03-24 DIAGNOSIS — E869 Volume depletion, unspecified: Secondary | ICD-10-CM | POA: Insufficient documentation

## 2022-03-24 DIAGNOSIS — Z7902 Long term (current) use of antithrombotics/antiplatelets: Secondary | ICD-10-CM | POA: Insufficient documentation

## 2022-03-24 DIAGNOSIS — I25110 Atherosclerotic heart disease of native coronary artery with unstable angina pectoris: Secondary | ICD-10-CM

## 2022-03-24 DIAGNOSIS — K5730 Diverticulosis of large intestine without perforation or abscess without bleeding: Secondary | ICD-10-CM | POA: Insufficient documentation

## 2022-03-24 DIAGNOSIS — I10 Essential (primary) hypertension: Secondary | ICD-10-CM | POA: Diagnosis present

## 2022-03-24 DIAGNOSIS — Z951 Presence of aortocoronary bypass graft: Secondary | ICD-10-CM

## 2022-03-24 DIAGNOSIS — R531 Weakness: Secondary | ICD-10-CM

## 2022-03-24 DIAGNOSIS — Z79899 Other long term (current) drug therapy: Secondary | ICD-10-CM | POA: Insufficient documentation

## 2022-03-24 DIAGNOSIS — R197 Diarrhea, unspecified: Secondary | ICD-10-CM | POA: Diagnosis not present

## 2022-03-24 DIAGNOSIS — I739 Peripheral vascular disease, unspecified: Secondary | ICD-10-CM

## 2022-03-24 DIAGNOSIS — N179 Acute kidney failure, unspecified: Secondary | ICD-10-CM | POA: Diagnosis not present

## 2022-03-24 DIAGNOSIS — E785 Hyperlipidemia, unspecified: Secondary | ICD-10-CM | POA: Diagnosis not present

## 2022-03-24 DIAGNOSIS — K219 Gastro-esophageal reflux disease without esophagitis: Secondary | ICD-10-CM | POA: Diagnosis not present

## 2022-03-24 DIAGNOSIS — G20 Parkinson's disease: Secondary | ICD-10-CM

## 2022-03-24 DIAGNOSIS — G20A1 Parkinson's disease without dyskinesia, without mention of fluctuations: Secondary | ICD-10-CM | POA: Diagnosis present

## 2022-03-24 DIAGNOSIS — G4733 Obstructive sleep apnea (adult) (pediatric): Secondary | ICD-10-CM

## 2022-03-24 DIAGNOSIS — Z955 Presence of coronary angioplasty implant and graft: Secondary | ICD-10-CM | POA: Diagnosis not present

## 2022-03-24 DIAGNOSIS — Z8673 Personal history of transient ischemic attack (TIA), and cerebral infarction without residual deficits: Secondary | ICD-10-CM | POA: Diagnosis not present

## 2022-03-24 DIAGNOSIS — J84112 Idiopathic pulmonary fibrosis: Secondary | ICD-10-CM

## 2022-03-24 DIAGNOSIS — R0602 Shortness of breath: Secondary | ICD-10-CM | POA: Diagnosis not present

## 2022-03-24 DIAGNOSIS — I2510 Atherosclerotic heart disease of native coronary artery without angina pectoris: Secondary | ICD-10-CM | POA: Diagnosis present

## 2022-03-24 DIAGNOSIS — F0670 Mild neurocognitive disorder due to known physiological condition without behavioral disturbance: Secondary | ICD-10-CM

## 2022-03-24 HISTORY — DX: Unstable angina: I20.0

## 2022-03-24 LAB — CBC WITH DIFFERENTIAL/PLATELET
Abs Immature Granulocytes: 0.04 10*3/uL (ref 0.00–0.07)
Basophils Absolute: 0 10*3/uL (ref 0.0–0.1)
Basophils Relative: 0 %
Eosinophils Absolute: 0.2 10*3/uL (ref 0.0–0.5)
Eosinophils Relative: 2 %
HCT: 40 % (ref 39.0–52.0)
Hemoglobin: 13.4 g/dL (ref 13.0–17.0)
Immature Granulocytes: 1 %
Lymphocytes Relative: 22 %
Lymphs Abs: 1.9 10*3/uL (ref 0.7–4.0)
MCH: 34.4 pg — ABNORMAL HIGH (ref 26.0–34.0)
MCHC: 33.5 g/dL (ref 30.0–36.0)
MCV: 102.6 fL — ABNORMAL HIGH (ref 80.0–100.0)
Monocytes Absolute: 0.9 10*3/uL (ref 0.1–1.0)
Monocytes Relative: 11 %
Neutro Abs: 5.6 10*3/uL (ref 1.7–7.7)
Neutrophils Relative %: 64 %
Platelets: 191 10*3/uL (ref 150–400)
RBC: 3.9 MIL/uL — ABNORMAL LOW (ref 4.22–5.81)
RDW: 14.7 % (ref 11.5–15.5)
WBC: 8.7 10*3/uL (ref 4.0–10.5)
nRBC: 0 % (ref 0.0–0.2)

## 2022-03-24 LAB — COMPREHENSIVE METABOLIC PANEL
ALT: 7 U/L (ref 0–44)
AST: 19 U/L (ref 15–41)
Albumin: 3.7 g/dL (ref 3.5–5.0)
Alkaline Phosphatase: 79 U/L (ref 38–126)
Anion gap: 8 (ref 5–15)
BUN: 78 mg/dL — ABNORMAL HIGH (ref 8–23)
CO2: 16 mmol/L — ABNORMAL LOW (ref 22–32)
Calcium: 8.9 mg/dL (ref 8.9–10.3)
Chloride: 113 mmol/L — ABNORMAL HIGH (ref 98–111)
Creatinine, Ser: 2.1 mg/dL — ABNORMAL HIGH (ref 0.61–1.24)
GFR, Estimated: 31 mL/min — ABNORMAL LOW (ref >60)
Glucose, Bld: 120 mg/dL — ABNORMAL HIGH (ref 70–99)
Potassium: 4.8 mmol/L (ref 3.5–5.1)
Sodium: 137 mmol/L (ref 135–145)
Total Bilirubin: 1.1 mg/dL (ref 0.3–1.2)
Total Protein: 6.9 g/dL (ref 6.5–8.1)

## 2022-03-24 LAB — MAGNESIUM: Magnesium: 2.2 mg/dL (ref 1.7–2.4)

## 2022-03-24 MED ORDER — ACETAMINOPHEN 325 MG PO TABS
650.0000 mg | ORAL_TABLET | Freq: Four times a day (QID) | ORAL | Status: DC | PRN
  Administered 2022-03-24: 650 mg via ORAL
  Filled 2022-03-24: qty 2

## 2022-03-24 MED ORDER — ATORVASTATIN CALCIUM 80 MG PO TABS
80.0000 mg | ORAL_TABLET | Freq: Every evening | ORAL | Status: DC
  Administered 2022-03-25: 80 mg via ORAL
  Filled 2022-03-24: qty 1

## 2022-03-24 MED ORDER — PANTOPRAZOLE SODIUM 40 MG PO TBEC
40.0000 mg | DELAYED_RELEASE_TABLET | Freq: Every day | ORAL | Status: DC
  Administered 2022-03-25: 40 mg via ORAL
  Filled 2022-03-24: qty 1

## 2022-03-24 MED ORDER — SODIUM CHLORIDE 0.9% FLUSH
3.0000 mL | Freq: Two times a day (BID) | INTRAVENOUS | Status: DC
  Administered 2022-03-25 (×2): 3 mL via INTRAVENOUS

## 2022-03-24 MED ORDER — LACTATED RINGERS IV BOLUS
500.0000 mL | Freq: Once | INTRAVENOUS | Status: AC
  Administered 2022-03-24: 500 mL via INTRAVENOUS

## 2022-03-24 MED ORDER — LOPERAMIDE HCL 2 MG PO CAPS: 2.0000 mg | ORAL_CAPSULE | ORAL | Status: DC | PRN

## 2022-03-24 MED ORDER — ACETAMINOPHEN 650 MG RE SUPP: 650.0000 mg | Freq: Four times a day (QID) | RECTAL | Status: DC | PRN

## 2022-03-24 MED ORDER — ENOXAPARIN SODIUM 30 MG/0.3ML IJ SOSY
30.0000 mg | PREFILLED_SYRINGE | INTRAMUSCULAR | Status: DC
  Administered 2022-03-24: 30 mg via SUBCUTANEOUS
  Filled 2022-03-24: qty 0.3

## 2022-03-24 MED ORDER — LACTATED RINGERS IV SOLN: INTRAVENOUS | Status: DC

## 2022-03-24 MED ORDER — CARBIDOPA-LEVODOPA 25-100 MG PO TABS
1.0000 | ORAL_TABLET | Freq: Three times a day (TID) | ORAL | Status: DC
  Administered 2022-03-24 – 2022-03-25 (×4): 1 via ORAL
  Filled 2022-03-24 (×5): qty 1

## 2022-03-24 NOTE — ED Triage Notes (Signed)
Reports patient diarrhea numerous times a day since starting ofev which is to extend his life.  Called PCP because he is weak, not eating or drinking and not getting out of the bed.

## 2022-03-24 NOTE — H&P (Signed)
History and Physical   Matthew Liu:[id number] DOB: [birth year] DOA: 03/24/2022  PCP: Nicoletta Dress, MD   Patient coming from: Home  Chief Complaint: Diarrhea, Fatigue, weakness  HPI: Matthew Liu is a 81 y.o. male with medical history significant of CAD status post CABG, hypertension, GERD, Barrett's esophagus, hyperlipidemia, PAD, CVA, OSA, Parkinson's with neurocognitive deficit, pulmonary fibrosis presenting with multiple complaints including diarrhea, fatigue, weakness.  Patient has had symptoms ongoing for the past 3 to 4 weeks since starting new medication, Ofev, for pulmonary fibrosis.  Has had worsening weakness, fatigue, diarrhea and some decreased p.o. intake.  Got to the point today where he was unable to get out of bed due to significant weakness.  They contacted his pulmonologist office today who recommended discontinuing medication as these are known side effects.  They also recommended due to the severity of his symptoms to proceed to ED for further evaluation.  Denies fevers, chills, chest pain, shortness of breath, abdominal pain, nausea, vomiting.  ED Course: Vital signs in the ED significant for heart rate in the 50s to 80s and blood pressure in the 99B to 716 systolic.  Lab work-up included CMP with chloride 113, bicarb 16, BUN 78, creatinine elevated 2.1 from baseline of 1-1.3.  CBC within normal limits.  Urinalysis and urine culture pending.  Chest x-ray showing no acute abnormality and status post CABG.  Magnesium level normal.  Patient received 500 cc bolus and started on a rate of IV fluids.  Review of Systems: As per HPI otherwise all other systems reviewed and are negative.  Past Medical History:  Diagnosis Date   Acute ischemic stroke ([place]) 09/15/2015   Ankle fracture, left 2009   rod placed   Ankle fracture, left    rod placed   Anxiety    Aspirin allergy    a. Anaphylactic rxn.   Barrett's esophagus    CAD (coronary artery disease)    a. Inf  STEMI 07/2013: s/p DES x 2 from ostial to [facility] 07/13/13. Residual Cx disease for med rx (PCI if recurrent pain).   Controlled gout    Dyslipidemia    ED (erectile dysfunction)    Edema of both legs    Gastric polyp    GERD (gastroesophageal reflux disease)    Hiatal hernia    History of nuclear stress test    Nuclear stress test 5/19: EF 53, no ischemia, no infarction; Low Risk   Hydrocele of testis    Hyperglycemia    a. A1C 5.8 in 07/2013.   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   LIMA to LAD, SVG to Diag, Sequential SVG to OM2-OM3, SVG to RCA, EVH via right thigh and leg   Unstable angina [facility])     Past Surgical History:  Procedure Laterality Date   BACK SURGERY     L1 kyphoplasty by Dr Trenton Gammon 03/2018     thoractic 02-2018   cataract surgery      bilateral    COLONOSCOPY     CORONARY ANGIOPLASTY     stents    CORONARY ARTERY BYPASS GRAFT N/A 12/12/2018   Procedure: CORONARY ARTERY BYPASS GRAFTING (CABG);  Surgeon: Rexene Alberts, MD;  Location: [place];  Service: Open Heart  Surgery;  Laterality: N/A;   ESOPHAGOGASTRODUODENOSCOPY  04/04/2016   Short-segment Barrett's esophagus (biopsied) Gastric polyps (status post polypectomy x1) Hiatal hernia   HYDROCELE EXCISION Left 07/17/2014   Procedure: LEFT HYDROCELECTOMY;  Surgeon: Malka So, MD;  Location: [facility];  Service: Urology;  Laterality: Left;   left ankle surgery  1999   LEFT HEART CATH AND CORONARY ANGIOGRAPHY N/A 12/11/2018   Procedure: LEFT HEART CATH AND CORONARY ANGIOGRAPHY;  Surgeon: Belva Crome, MD;  Location: [place] CV LAB;  Service: Cardiovascular;  Laterality: N/A;   LEFT HEART CATHETERIZATION WITH CORONARY ANGIOGRAM N/A 07/13/2013   Procedure: LEFT HEART CATHETERIZATION WITH CORONARY ANGIOGRAM;   Surgeon: Sinclair Grooms, MD;  Location: [facility] CATH LAB;  Service: Cardiovascular;  Laterality: N/A;   PERCUTANEOUS CORONARY STENT INTERVENTION (PCI-S)  07/13/2013   Procedure: PERCUTANEOUS CORONARY STENT INTERVENTION (PCI-S);  Surgeon: Sinclair Grooms, MD;  Location: [facility] CATH LAB;  Service: Cardiovascular;;   TEE WITHOUT CARDIOVERSION N/A 12/12/2018   Procedure: TRANSESOPHAGEAL ECHOCARDIOGRAM (TEE);  Surgeon: Rexene Alberts, MD;  Location: [place];  Service: Open Heart Surgery;  Laterality: N/A;   TONSILLECTOMY      Social History  reports that he has never smoked. He has never used smokeless tobacco. He reports current alcohol use. He reports that he does not use drugs.  Allergies  Allergen Reactions   Aspirin Hives and Shortness Of Breath    "Shortness of Breath" can't breathe   Quinine Derivatives Hives and Other (See Comments)    "looks like he has the measles"    Family History  Problem Relation Age of Onset   Hypertension Mother    Ovarian cancer Mother    Heart failure Father    Stroke Neg Hx   Reviewed on admission  Prior to Admission medications   Medication Sig Start Date End Date Taking? Authorizing Provider  acetaminophen (TYLENOL) 500 MG tablet Take 1 tablet (500 mg total) by mouth every 6 (six) hours as needed for mild pain or headache. 01/12/18   Norm Parcel, PA-C  ALPRAZolam Duanne Moron) 0.25 MG tablet Take 0.25 mg by mouth 2 (two) times daily as needed for anxiety.  06/29/13   [provider]  atorvastatin (LIPITOR) 80 MG tablet Take 1 tablet (80 mg total) by mouth every evening. 02/08/19   Burtis Junes, NP  carbidopa-levodopa (SINEMET IR) 25-100 MG tablet Take 1 tablet by mouth 3 (three) times daily. Take 1 tablet three times daily 01/25/22   Rondel Jumbo, PA-C  Cholecalciferol (VITAMIN D3) 50 MCG (2000 UT) capsule Take 2,000 Units by mouth daily.    [provider]  clopidogrel (PLAVIX) 75 MG tablet TAKE 1 TABLET BY MOUTH EVERY DAY 06/22/20    Belva Crome, MD  FLUoxetine (PROZAC) 40 MG capsule Take by mouth. 11/10/20   [provider]  hydrochlorothiazide (MICROZIDE) 12.5 MG capsule TAKE 1 CAPSULE BY MOUTH EVERY DAY 04/14/21   Belva Crome, MD  lisinopril (ZESTRIL) 40 MG tablet Take 20 mg by mouth at bedtime.  02/16/19   [provider]  metoprolol tartrate (LOPRESSOR) 25 MG tablet Take 0.5 tablets (12.5 mg total) by mouth 2 (two) times daily. 03/09/21   Belva Crome, MD  mirtazapine (REMERON) 7.5 MG tablet Take 7.5 mg by mouth at bedtime.    [provider]  Multiple Vitamins-Minerals (ONE-A-DAY MENS 50+) TABS Take 1 tablet by mouth daily with breakfast.    [provider]  Nintedanib (OFEV)  150 MG CAPS Take 1 capsule (150 mg total) by mouth 2 (two) times daily. 02/07/22   Cassandria Anger, RPH-CPP  pantoprazole (PROTONIX) 40 MG tablet Take 1 tablet (40 mg total) by mouth daily. 01/27/21   Jackquline Denmark, MD    Physical Exam: Vitals:   03/24/22 1351 03/24/22 1726 03/24/22 1926 03/24/22 2030  BP: 114/76 (!) 104/58 121/61 122/61  Pulse: (!) 56 86 (!) 58 67  Resp: '18 17 18 18  '$ Temp:  (!) 97.5 F (36.4 C) (!) 97.5 F (36.4 C)   TempSrc:  Oral Oral   SpO2: 100% 92% 100% 96%  Weight:      Height:        Physical Exam Constitutional:      General: He is not in acute distress.    Appearance: Normal appearance.  HENT:     Head: Normocephalic and atraumatic.     Mouth/Throat:     Mouth: Mucous membranes are moist.     Pharynx: Oropharynx is clear.  Eyes:     Extraocular Movements: Extraocular movements intact.     Pupils: Pupils are equal, round, and reactive to light.  Cardiovascular:     Rate and Rhythm: Normal rate and regular rhythm.     Pulses: Normal pulses.     Heart sounds: Normal heart sounds.  Pulmonary:     Effort: Pulmonary effort is normal. No respiratory distress.     Breath sounds: Normal breath sounds.  Abdominal:     General: Bowel sounds are normal. There is no  distension.     Palpations: Abdomen is soft.     Tenderness: There is no abdominal tenderness.  Musculoskeletal:        General: No swelling or deformity.  Skin:    General: Skin is warm and dry.  Neurological:     General: No focal deficit present.     Mental Status: Mental status is at baseline.    Labs on Admission: I have personally reviewed following labs and imaging studies  CBC: Recent Labs  Lab 03/24/22 1044  WBC 8.7  NEUTROABS 5.6  HGB 13.4  HCT 40.0  MCV 102.6*  PLT 242    Basic Metabolic Panel: Recent Labs  Lab 03/24/22 1044  NA 137  K 4.8  CL 113*  CO2 16*  GLUCOSE 120*  BUN 78*  CREATININE 2.10*  CALCIUM 8.9  MG 2.2    GFR: Estimated Creatinine Clearance: 27.1 mL/min (A) (by C-G formula based on SCr of 2.1 mg/dL (H)).  Liver Function Tests: Recent Labs  Lab 03/24/22 1044  AST 19  ALT 7  ALKPHOS 79  BILITOT 1.1  PROT 6.9  ALBUMIN 3.7    Urine analysis:    Component Value Date/Time   COLORURINE STRAW (A) 12/11/2018 2200   APPEARANCEUR CLEAR 12/11/2018 2200   LABSPEC 1.009 12/11/2018 2200   PHURINE 5.0 12/11/2018 2200   GLUCOSEU NEGATIVE 12/11/2018 2200   HGBUR NEGATIVE 12/11/2018 2200   BILIRUBINUR NEGATIVE 12/11/2018 2200   KETONESUR NEGATIVE 12/11/2018 2200   PROTEINUR NEGATIVE 12/11/2018 2200   NITRITE NEGATIVE 12/11/2018 2200   LEUKOCYTESUR NEGATIVE 12/11/2018 2200    Radiological Exams on Admission: DG Chest 2 View  Result Date: 03/24/2022 CLINICAL DATA:  Shortness of breath, weakness and diarrhea. EXAM: CHEST - 2 VIEW COMPARISON:  11/17/2021 FINDINGS: Previous median sternotomy and CABG. Heart size is normal. Aortic atherosclerosis and tortuosity. The lungs are clear. No infiltrate, collapse or effusion. The pulmonary vascularity is normal. Old augmented  compression fracture at L1. IMPRESSION: Previous CABG. No active cardiopulmonary disease. Aortic atherosclerosis. Electronically Signed   By: Nelson Chimes M.D.   On:  03/24/2022 11:10    EKG: Not performed in the emergency department.  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 > Creatinine elevated to 2.1 from baseline around 1-1.3 per chart review. > Significant dehydration from diarrhea and decreased p.o. intake. > Received 500 cc bolus in the ED and started on a rate of 100-hour. - Monitor on telemetry - Continue with IV fluids at 125 cc an hour - Trend renal function electrolytes - Avoid nephrotoxic agents - Hold home hydrochlorothiazide, lisinopril  Diarrhea Fatigue Weakness > Patient with worsening diarrhea fatigue and weakness since starting new medication, Ofev, for pulmonary fibrosis. > Has already been advised to stop this medication by pulmonology and was advised is be seen in the ED for further evaluation. > Symptoms all likely side effects from this medication. > C. difficile panel ordered in the ED and is pending. - Monitor on telemetry - Supportive care - Follow-up C. difficile panel - Discontinue Ofev  CAD Hypertension > Status post CABG x5.  Has aspirin allergy. - Continue home atorvastatin, Plavix - Holding home metoprolol, lisinopril  Pulmonary fibrosis - Ofev discontinue - Continue to monitor  Hyperlipidemia PAD History of CVA - Continue home atorvastatin - Continue home Plavix  Parkinson's disease > With mild  neurocognitive deficit - Continue home carbidopa-levodopa  Anxiety - Continue as needed Xanax  DVT prophylaxis: Lovenox Code Status:   Full Family Communication:  Updated at bedside  Disposition Plan:   Patient is from:  Home  Anticipated DC to:  Home  Anticipated DC date:  1 to 3 days  Anticipated DC barriers: None  Consults called:  None Admission status:  Observation, telemetry  Severity of Illness: The appropriate patient status for this patient is OBSERVATION. Observation status is judged to be reasonable and necessary in order to provide the required intensity of service to ensure the patient's safety. The patient's presenting symptoms, physical exam findings, and initial radiographic and laboratory data in the context of their medical condition is felt to place them at decreased risk for further clinical deterioration. Furthermore, it is anticipated that the patient will be medically stable for discharge from the hospital within 2 midnights of admission.    Marcelyn Bruins MD [facility]  How to contact the [facility] Attending or Consulting provider [place] or covering provider during after hours [place], for this patient?   Check the care team in [facility] and look for a) attending/consulting [facility] provider listed and b) the [facility] team listed Log into [email or web address] and use [place]'s universal password to access. If you do not have the password, please contact the hospital operator. Locate the [facility] provider you are looking for under [facility] and page to a number that you can be directly reached.  If you still have difficulty reaching the provider, please page the [facility] (Director on Call) for the Hospitalists listed on amion for assistance.  03/24/2022, 9:13 PM

## 2022-03-24 NOTE — Telephone Encounter (Signed)
Called and spoke with Matthew Liu. She verbalized understanding and will have the patient D/C the Ofev. They will follow up with Dr. Vaughan Browner next week.   Nothing further needed at time of call.

## 2022-03-24 NOTE — ED Provider Triage Note (Signed)
Emergency Medicine Provider Triage Evaluation Note  Matthew Liu , a 81 y.o. male  was evaluated in triage.  Pt complains of diarrhea and generalized weakness.  Patient has had the symptoms since being started on the medication Ofev.  Patient reports he has been on this medication for a few weeks at this time.  Patient is having approximately 6 episodes of diarrhea every day.  Stool is described as "jelly."  No reports of blood in stool or melena.  Review of Systems  Positive: Diarrhea, generalized weakness, fatigue, nausea Negative: Fever, chills, abdominal pain, vomiting, blood in stool, melena, constipation, dysuria, hematuria, urinary urgency, chest pain, shortness of breath,  Physical Exam  BP 91/65 (BP Location: Right Arm)   Pulse (!) 58   Temp 97.7 F (36.5 C) (Oral)   Resp 18   Ht '5\' 9"'$  (1.753 m)   Wt 75.8 kg   SpO2 100%   BMI 24.66 kg/m  Gen:   Awake, no distress   Resp:  Normal effort  MSK:   Moves extremities without difficulty  Other:  Abdomen soft, nondistended, nontender with no guarding or rebound tenderness.  Medical Decision Making  Medically screening exam initiated at 10:39 AM.  Appropriate orders placed.  SUDEEP SCHEIBEL was informed that the remainder of the evaluation will be completed by another provider, this initial triage assessment does not replace that evaluation, and the importance of remaining in the ED until their evaluation is complete.     Loni Beckwith, [place] 03/24/22 1041

## 2022-03-24 NOTE — Telephone Encounter (Signed)
Called and spoke with Patsy. She was calling to complain that he has completed blood work and a CXR and no one has reviewed the results with them. She was upset that they are having to wait. I advised her to communicate with the hospital staff. She verbalized understanding.   She wanted to know if Dr. Vaughan Browner could review the results.   Dr. Vaughan Browner, can you please advise? Thanks!

## 2022-03-24 NOTE — Telephone Encounter (Signed)
Called patient's wife but she did not answer. Her VM was not setup so I could not leave a message. Will attempt to call back later today.

## 2022-03-24 NOTE — Telephone Encounter (Signed)
see encounter from earlier today(pt spouse has called numerous times and is wanting answers for her husband) she is currently at the ed with her husband who she says is still not feeling well and is crouched over in the chair at the hospital and states testing has been done at the ed , but no one has came in to give her or her husband answers about the results and wants to know if someone here can give them to her.

## 2022-03-24 NOTE — Telephone Encounter (Signed)
Patient's wife returned call. She stated that the patient has been on Ofev '150mg'$  twice daily for the past 3-4 weeks. He has been getting weaker each week. He is at the point now that he is unable to get out of bed due to the fatigue and leg pain. He also has had extreme diarrhea. She reports he has at least 6 episodes a day. He is not able to keep any food in his system.   She contacted his PCP and he recommended that he go to the ED. He is currently in the ED now. She confirmed that he has not taken any of the medication today, last dose was last night.   She wanted to know if he needed to stop the medication due to the side effects.   Dr. Vaughan Browner, can you please advise? Thanks!

## 2022-03-24 NOTE — ED Provider Notes (Signed)
Emergency Department Provider Note   I have reviewed the triage vital signs and the nursing notes.   HISTORY  Chief Complaint Diarrhea   HPI Matthew Liu is a 81 y.o. male with past medical history reviewed below presents emergency department with 5-6 episodes of nonbloody diarrhea daily along with decreased oral intake, generalized weakness/fatigue.  Symptoms have been progressively worsening over the past 3 to 4 weeks.  Wife is at bedside and assists with some of the history.  No fevers or blood in the diarrhea.  He is not having significant abdominal pain.  Around the time of symptom onset he began a new medication Ofev, and has continued this medicine as prescribed.     Past Medical History:  Diagnosis Date   Acute ischemic stroke ([place]) 09/15/2015   Ankle fracture, left 2009   rod placed   Ankle fracture, left    rod placed   Anxiety    Aspirin allergy    a. Anaphylactic rxn.   Barrett's esophagus    CAD (coronary artery disease)    a. Inf STEMI 07/2013: s/p DES x 2 from ostial to [facility] 07/13/13. Residual Cx disease for med rx (PCI if recurrent pain).   Controlled gout    Dyslipidemia    ED (erectile dysfunction)    Edema of both legs    Gastric polyp    GERD (gastroesophageal reflux disease)    Hiatal hernia    History of nuclear stress test    Nuclear stress test 5/19: EF 53, no ischemia, no infarction; Low Risk   Hydrocele of testis    Hyperglycemia    a. A1C 5.8 in 07/2013.   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 Positive generalized weakness.  Eyes: No visual  changes. ENT: No sore throat. Cardiovascular: Denies chest pain. Respiratory: Denies shortness of breath. Gastrointestinal: No abdominal pain.  No nausea, no vomiting. Positive diarrhea.  No constipation. Genitourinary: Negative for dysuria. Musculoskeletal: Negative for back pain. Skin: Negative for rash. Neurological: Negative for headaches, focal weakness or numbness.   ____________________________________________   PHYSICAL EXAM:  VITAL SIGNS: ED Triage Vitals  Enc Vitals Group     BP 03/24/22 1026 91/65     Pulse Rate 03/24/22 1026 (!) 58     Resp 03/24/22 1026 18     Temp 03/24/22 1026 97.7 F (36.5 C)     Temp Source 03/24/22 1026 Oral     SpO2 03/24/22 1026 100 %     Weight 03/24/22 1030 167 lb (75.8 kg)     Height 03/24/22 1030 '5\' 9"'$  (1.753 m)   Constitutional: Alert and oriented. Well appearing and in no acute distress. Eyes: Conjunctivae are normal.  Head: Atraumatic. Nose: No congestion/rhinnorhea. Mouth/Throat: Mucous membranes are dry.  Neck: No stridor.  Cardiovascular: Normal rate, regular rhythm. Good peripheral circulation. Grossly normal heart sounds.   Respiratory: Normal respiratory effort.  No retractions. Lungs CTAB. Gastrointestinal: Soft and nontender. No distention.  Musculoskeletal: No lower extremity tenderness nor edema. No gross deformities of extremities.  Neurologic:  Normal speech and language. No gross focal neurologic deficits are appreciated.  Skin:  Skin is warm, dry and intact. No rash noted.  ____________________________________________   LABS (all labs ordered are listed, but only abnormal results are displayed)  Labs Reviewed  COMPREHENSIVE METABOLIC PANEL - Abnormal; Notable for the following components:      Result Value   Chloride 113 (*)    CO2 16 (*)    Glucose, Bld 120 (*)    BUN 78 (*)    Creatinine, Ser 2.10 (*)    GFR, Estimated 31 (*)    All other components within normal limits  CBC WITH DIFFERENTIAL/PLATELET  - Abnormal; Notable for the following components:   RBC 3.90 (*)    MCV 102.6 (*)    MCH 34.4 (*)    All other components within normal limits  URINE CULTURE  C DIFFICILE QUICK SCREEN W PCR REFLEX    MAGNESIUM  URINALYSIS, ROUTINE W REFLEX MICROSCOPIC  BASIC METABOLIC PANEL  CBC   ____________________________________________  EKG  None ____________________________________________  RADIOLOGY  DG Chest 2 View  Result Date: 03/24/2022 CLINICAL DATA:  Shortness of breath, weakness and diarrhea. EXAM: CHEST - 2 VIEW COMPARISON:  11/17/2021 FINDINGS: Previous median sternotomy and CABG. Heart size is normal. Aortic atherosclerosis and tortuosity. The lungs are clear. No infiltrate, collapse or effusion. The pulmonary vascularity is normal. Old augmented compression fracture at L1. IMPRESSION: Previous CABG. No active cardiopulmonary disease. Aortic atherosclerosis. Electronically Signed   By: Nelson Chimes M.D.   On: 03/24/2022 11:10    ____________________________________________   PROCEDURES  Procedure(s) performed:   Procedures  None  ____________________________________________   INITIAL IMPRESSION / ASSESSMENT AND PLAN / ED COURSE  Pertinent labs & imaging results that were available during my care of the patient were reviewed by me and considered in my medical decision making (see chart for details).   This patient is Presenting for Evaluation of generalized weakness, which does require a range of treatment options, and is a complaint that involves a high risk of morbidity and mortality.  The Differential Diagnoses includes but not exclusive to dehydration, atypical ACS, sepsis, infectious diarrhea.  Critical Interventions-    Medications  atorvastatin (LIPITOR) tablet 80 mg (has no administration in time range)  pantoprazole (PROTONIX) EC tablet 40 mg (has no administration in time range)  carbidopa-levodopa (SINEMET IR) 25-100 MG per tablet immediate release 1  tablet (has no administration in time range)  enoxaparin (LOVENOX) injection 30 mg (has no administration in time range)  sodium chloride flush (NS) 0.9 % injection 3 mL (has no administration in time range)  lactated ringers infusion ( Intravenous New Bag/Given 03/24/22 2141)  acetaminophen (TYLENOL) tablet 650 mg (has no administration in time range)    Or  acetaminophen (TYLENOL) suppository 650 mg (has no administration in time range)  loperamide (IMODIUM) capsule 2 mg (has no administration in time range)  lactated ringers bolus 500 mL (0 mLs Intravenous Stopped 03/24/22 2108)    Reassessment after intervention: No worsening vital signs. Symptoms slightly improved.    I did obtain Additional Historical Information from Wife at bedside.  I decided to review pertinent External Data, and in summary patient with Telephone encounters with PCP office today regarding symptoms and concern for med side effect.   Clinical Laboratory Tests Ordered, included creatinine elevated from baseline at 2.10.  GFR currently 31 with CO2 of 16.  Potassium and magnesium are normal.  Recent GFR numbers reviewed showing GFR  closer to 60 in the recent past. No anemia.   Radiologic Tests Ordered, included CXR. I independently interpreted the images and agree with radiology interpretation.   Cardiac Monitor Tracing which shows sinus bradycardia.   Social Determinants of Health Risk patient is a non-smoker.   Consult complete with Hospitalist. Plan for admit.   Medical Decision Making: Summary:  Patient presents emergency department with generalized weakness, diarrhea, worsening kidney function.  He appears clinically dehydrated.  No leukocytosis, abdominal tenderness, blood in the stool to suspect acute infectious process.  Suspect this is related to his home prescription medications with the time of onset similar to starting med.  Will send C. Difficile panel but patient is overall lower risk for  this.  Reevaluation with update and discussion with patient and family. They are in agreement with plan for admit.   Disposition: admit  ____________________________________________  FINAL CLINICAL IMPRESSION(S) / ED DIAGNOSES  Final diagnoses:  AKI (acute kidney injury) ([place])  Diarrhea, unspecified type  Generalized weakness    Note:  This document was prepared using Dragon voice recognition software and may include unintentional dictation errors.  Nanda Quinton, MD, [facility] Emergency Medicine    Zariyah Stephens, [facility], MD 03/24/22 2142

## 2022-03-24 NOTE — ED Notes (Signed)
Called no answer X1 for vitals

## 2022-03-24 NOTE — Telephone Encounter (Signed)
Yes. Please stop the ofev due to side effects

## 2022-03-25 DIAGNOSIS — Z8673 Personal history of transient ischemic attack (TIA), and cerebral infarction without residual deficits: Secondary | ICD-10-CM | POA: Diagnosis not present

## 2022-03-25 DIAGNOSIS — I25110 Atherosclerotic heart disease of native coronary artery with unstable angina pectoris: Secondary | ICD-10-CM | POA: Diagnosis not present

## 2022-03-25 DIAGNOSIS — R197 Diarrhea, unspecified: Secondary | ICD-10-CM | POA: Diagnosis not present

## 2022-03-25 DIAGNOSIS — E785 Hyperlipidemia, unspecified: Secondary | ICD-10-CM | POA: Diagnosis not present

## 2022-03-25 DIAGNOSIS — N179 Acute kidney failure, unspecified: Secondary | ICD-10-CM | POA: Diagnosis not present

## 2022-03-25 DIAGNOSIS — K219 Gastro-esophageal reflux disease without esophagitis: Secondary | ICD-10-CM | POA: Diagnosis not present

## 2022-03-25 DIAGNOSIS — R531 Weakness: Secondary | ICD-10-CM | POA: Diagnosis not present

## 2022-03-25 DIAGNOSIS — K22719 Barrett's esophagus with dysplasia, unspecified: Secondary | ICD-10-CM

## 2022-03-25 DIAGNOSIS — R5383 Other fatigue: Secondary | ICD-10-CM | POA: Diagnosis not present

## 2022-03-25 LAB — URINALYSIS, ROUTINE W REFLEX MICROSCOPIC
Bacteria, UA: NONE SEEN
Bilirubin Urine: NEGATIVE
Glucose, UA: NEGATIVE mg/dL
Ketones, ur: NEGATIVE mg/dL
Leukocytes,Ua: NEGATIVE
Nitrite: NEGATIVE
Protein, ur: NEGATIVE mg/dL
Specific Gravity, Urine: 1.012 (ref 1.005–1.030)
pH: 5 (ref 5.0–8.0)

## 2022-03-25 LAB — URINE CULTURE: Culture: NO GROWTH

## 2022-03-25 LAB — BASIC METABOLIC PANEL
Anion gap: 9 (ref 5–15)
BUN: 69 mg/dL — ABNORMAL HIGH (ref 8–23)
CO2: 15 mmol/L — ABNORMAL LOW (ref 22–32)
Calcium: 8.1 mg/dL — ABNORMAL LOW (ref 8.9–10.3)
Chloride: 112 mmol/L — ABNORMAL HIGH (ref 98–111)
Creatinine, Ser: 1.81 mg/dL — ABNORMAL HIGH (ref 0.61–1.24)
GFR, Estimated: 37 mL/min — ABNORMAL LOW (ref >60)
Glucose, Bld: 94 mg/dL (ref 70–99)
Potassium: 4 mmol/L (ref 3.5–5.1)
Sodium: 136 mmol/L (ref 135–145)

## 2022-03-25 LAB — CBC
HCT: 34.6 % — ABNORMAL LOW (ref 39.0–52.0)
Hemoglobin: 11.6 g/dL — ABNORMAL LOW (ref 13.0–17.0)
MCH: 34.4 pg — ABNORMAL HIGH (ref 26.0–34.0)
MCHC: 33.5 g/dL (ref 30.0–36.0)
MCV: 102.7 fL — ABNORMAL HIGH (ref 80.0–100.0)
Platelets: 137 10*3/uL — ABNORMAL LOW (ref 150–400)
RBC: 3.37 MIL/uL — ABNORMAL LOW (ref 4.22–5.81)
RDW: 14.5 % (ref 11.5–15.5)
WBC: 6.5 10*3/uL (ref 4.0–10.5)
nRBC: 0 % (ref 0.0–0.2)

## 2022-03-25 LAB — C DIFFICILE QUICK SCREEN W PCR REFLEX
C Diff antigen: NEGATIVE
C Diff interpretation: NOT DETECTED
C Diff toxin: NEGATIVE

## 2022-03-25 MED ORDER — LOPERAMIDE HCL 2 MG PO CAPS
4.0000 mg | ORAL_CAPSULE | Freq: Once | ORAL | Status: AC
Start: 2022-03-25 — End: 2022-03-25
  Administered 2022-03-25: 4 mg via ORAL
  Filled 2022-03-25: qty 2

## 2022-03-25 MED ORDER — ENSURE ENLIVE PO LIQD: 237.0000 mL | Freq: Two times a day (BID) | ORAL | Status: DC

## 2022-03-25 MED ORDER — ENOXAPARIN SODIUM 40 MG/0.4ML IJ SOSY
40.0000 mg | PREFILLED_SYRINGE | INTRAMUSCULAR | Status: DC
  Administered 2022-03-25: 40 mg via SUBCUTANEOUS
  Filled 2022-03-25: qty 0.4

## 2022-03-25 MED ORDER — ADULT MULTIVITAMIN W/MINERALS CH
1.0000 | ORAL_TABLET | Freq: Every day | ORAL | Status: DC
  Administered 2022-03-25: 1 via ORAL
  Filled 2022-03-25: qty 1

## 2022-03-25 NOTE — Progress Notes (Signed)
Patient declined CPAP. States hes going home tomorrow and doesn't want to get charged for hospital unit.

## 2022-03-25 NOTE — ED Notes (Signed)
Pt ambulatory to restroom with standby assist

## 2022-03-25 NOTE — Hospital Course (Signed)
Matthew Liu is a 81 y.o. male with past medical history significant of CAD status post CABG, hypertension, GERD, Barrett's esophagus, hyperlipidemia, PAD, CVA, OSA, Parkinson's with neurocognitive deficit, pulmonary fibrosis presented to the hospital with diarrhea, fatigue and generalized weakness.  Patient has been having symptoms for the last 3 to 4 weeks since starting  Ofev to the point he was unable to get out of the bed.  Patient contacted the pulmonary office who recommended to ofev and patient then presented to the ED. Lab work showed creatinine of 2.1 from baseline 1-1 0.3.  Urinalysis and urine culture was sent, lab work-up included CMP with chloride 113, bicarb 16, BUN 78, creatinine elevated 2.1 from baseline of 1-1.3.  CBC within normal limits.  Urinalysis was negative.  Urine culture sent from the ED.  Chest x-ray showed no acute abnormality.  Magnesium was normal.  Patient received a 500 mL of IV fluid and was started on IV fluids and was admitted to the hospital for further evaluation and treatment.   Assessment and plan  Acute kidney injury secondary to volume depletion from diarrhea. Creatinine was elevated 2.1 from baseline around 1-1 0.3.  Received IV fluid bolus followed by maintenance fluids.  Creatinine has significantly improved at this time.  Creatinine prior to discharge was 1.2.  Diarrhea/Fatigue/generalized weakness Improved at this time.  Patient with worsening diarrhea fatigue and weakness starting Ofev, for pulmonary fibrosis.  Received loperamide for diarrhea.  We will prescribe some on discharge as needed.  Patient feels much stronger.  Has been seen by physical therapy who recommended home PT on discharge.    CAD/Hypertension Status post CABG x5.  Allergic to aspirin.  Continue Plavix, Lipitor, metoprolol.  Resume hydrochlorothiazide and lisinopril from 03/28/2022.   Pulmonary fibrosis Discontinue Ofev due to diarrhea.  Patient does have an appointment with pulmonary  to discuss about further treatment plan.  Hyperlipidemia/PAD/History of CVA Continue Lipitor and Plavix.  Parkinson's disease Continue Sinemet   Anxiety Continue as needed Xanax

## 2022-03-25 NOTE — ED Notes (Signed)
ED TO INPATIENT HANDOFF REPORT  S Name/Age/Gender Matthew Liu 81 y.o. male Room/Bed: 039C/039C  Code Status   Code Status: Full Code  Home/SNF/Other Home Patient oriented to: self, place, time, and situation Is this baseline? Yes   Triage Complete: Triage complete  Chief Complaint AKI (acute kidney injury) ([place]) [N17.9]  Triage Note Reports patient diarrhea numerous times a day since starting ofev which is to extend his life.  Called PCP because he is weak, not eating or drinking and not getting out of the bed.     Allergies Allergies  Allergen Reactions   Aspirin Hives and Shortness Of Breath    "Shortness of Breath" can't breathe   Quinine Derivatives Hives and Other (See Comments)    "looks like he has the measles"    Level of Care/Admitting Diagnosis ED Disposition     ED Disposition  Admit   Condition  --   [place]: [place] [[id number]]  Level of Care: Telemetry Medical [104]  May place patient in observation at [facility] or [facility] [place] if equivalent level of care is available:: No  Covid Evaluation: Asymptomatic - no recent exposure (last 10 days) testing not required  Diagnosis: AKI (acute kidney injury) [facility]) [[id number]]  Admitting Physician: Marcelyn Bruins [[id number]]  Attending Physician: Marcelyn Bruins [[id number]]          B Medical/Surgery History Past Medical History:  Diagnosis Date   Acute ischemic stroke ([place]) 09/15/2015   Ankle fracture, left 2009   rod placed   Ankle fracture, left    rod placed   Anxiety    Aspirin allergy    a. Anaphylactic rxn.   Barrett's esophagus    CAD (coronary artery disease)    a. Inf STEMI 07/2013: s/p DES x 2 from ostial to [facility] 07/13/13. Residual Cx disease for med rx (PCI if recurrent pain).   Controlled gout    Dyslipidemia    ED (erectile dysfunction)    Edema of both legs    Gastric polyp    GERD (gastroesophageal reflux disease)    Hiatal hernia     History of nuclear stress test    Nuclear stress test 5/19: EF 53, no ischemia, no infarction; Low Risk   Hydrocele of testis    Hyperglycemia    a. A1C 5.8 in 07/2013.   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   LIMA to LAD, SVG to Diag, Sequential SVG to OM2-OM3, SVG to RCA, EVH via right thigh and leg   Unstable angina [facility])    Past Surgical History:  Procedure Laterality Date   BACK SURGERY     L1 kyphoplasty by Dr Trenton Gammon 03/2018     thoractic 02-2018   cataract surgery      bilateral    COLONOSCOPY     CORONARY ANGIOPLASTY     stents    CORONARY ARTERY BYPASS GRAFT N/A 12/12/2018   Procedure: CORONARY ARTERY BYPASS GRAFTING (CABG);  Surgeon: Rexene Alberts, MD;  Location: [facility];  Service: Open Heart Surgery;  Laterality: N/A;   ESOPHAGOGASTRODUODENOSCOPY  04/04/2016   Short-segment Barrett's esophagus (biopsied) Gastric polyps (status post polypectomy x1) Hiatal hernia   HYDROCELE EXCISION Left 07/17/2014  Procedure: LEFT HYDROCELECTOMY;  Surgeon: Malka So, MD;  Location: [facility];  Service: Urology;  Laterality: Left;   left ankle surgery  1999   LEFT HEART CATH AND CORONARY ANGIOGRAPHY N/A 12/11/2018   Procedure: LEFT HEART CATH AND CORONARY ANGIOGRAPHY;  Surgeon: Belva Crome, MD;  Location: [facility] CV LAB;  Service: Cardiovascular;  Laterality: N/A;   LEFT HEART CATHETERIZATION WITH CORONARY ANGIOGRAM N/A 07/13/2013   Procedure: LEFT HEART CATHETERIZATION WITH CORONARY ANGIOGRAM;  Surgeon: Sinclair Grooms, MD;  Location: [facility] CATH LAB;  Service: Cardiovascular;  Laterality: N/A;   PERCUTANEOUS CORONARY STENT INTERVENTION (PCI-S)  07/13/2013   Procedure: PERCUTANEOUS CORONARY STENT INTERVENTION (PCI-S);  Surgeon: Sinclair Grooms, MD;  Location: [facility] C[facility] LAB;   Service: Cardiovascular;;   TEE WITHOUT CARDIOVERSION N/A 12/12/2018   Procedure: TRANSESOPHAGEAL ECHOCARDIOGRAM (TEE);  Surgeon: Rexene Alberts, MD;  Location: [place];  Service: Open Heart Surgery;  Laterality: N/A;   TONSILLECTOMY       A IV Location/Drains/Wounds Patient Lines/Drains/Airways Status     Active Line/Drains/Airways     Name Placement date Placement time Site Days   Peripheral IV 03/24/22 18 G Right Antecubital 03/24/22  2028  Antecubital  1   Incision (Closed) 12/12/18 Leg Right 12/12/18  1200  -- 1199   Incision (Closed) 12/12/18 Chest Other (Comment) 12/12/18  1200  -- 1199            Intake/Output Last 24 hours  Intake/Output Summary (Last 24 hours) at 03/25/2022 1210 Last data filed at 03/24/2022 2108 Gross per 24 hour  Intake 500 ml  Output --  Net 500 ml    Labs/Imaging Results for orders placed or performed during the hospital encounter of 03/24/22 (from the past 48 hour(s))  Comprehensive metabolic panel     Status: Abnormal   Collection Time: 03/24/22 10:44 AM  Result Value Ref Range   Sodium 137 135 - 145 mmol/L   Potassium 4.8 3.5 - 5.1 mmol/L   Chloride 113 (H) 98 - 111 mmol/L   CO2 16 (L) 22 - 32 mmol/L   Glucose, Bld 120 (H) 70 - 99 mg/dL    Comment: Glucose reference range applies only to samples taken after fasting for at least 8 hours.   BUN 78 (H) 8 - 23 mg/dL   Creatinine, Ser 2.10 (H) 0.61 - 1.24 mg/dL   Calcium 8.9 8.9 - 10.3 mg/dL   Total Protein 6.9 6.5 - 8.1 g/dL   Albumin 3.7 3.5 - 5.0 g/dL   AST 19 15 - 41 U/L   ALT 7 0 - 44 U/L   Alkaline Phosphatase 79 38 - 126 U/L   Total Bilirubin 1.1 0.3 - 1.2 mg/dL   GFR, Estimated 31 (L) >60 mL/min    Comment: (NOTE) Calculated using the CKD-EPI Creatinine Equation (2021)    Anion gap 8 5 - 15    Comment: Performed at [facility] [address], [place], [place] [address]  CBC with Differential     Status: Abnormal   Collection Time: 03/24/22 10:44 AM  Result Value Ref  Range   WBC 8.7 4.0 - 10.5 K/uL   RBC 3.90 (L) 4.22 - 5.81 MIL/uL   Hemoglobin 13.4 13.0 - 17.0 g/dL   HCT 40.0 39.0 - 52.0 %   MCV 102.6 (H) 80.0 - 100.0 fL   MCH 34.4 (H) 26.0 - 34.0 pg   MCHC 33.5 30.0 - 36.0 g/dL   RDW 14.7  11.5 - 15.5 %   Platelets 191 150 - 400 K/uL   nRBC 0.0 0.0 - 0.2 %   Neutrophils Relative % 64 %   Neutro Abs 5.6 1.7 - 7.7 K/uL   Lymphocytes Relative 22 %   Lymphs Abs 1.9 0.7 - 4.0 K/uL   Monocytes Relative 11 %   Monocytes Absolute 0.9 0.1 - 1.0 K/uL   Eosinophils Relative 2 %   Eosinophils Absolute 0.2 0.0 - 0.5 K/uL   Basophils Relative 0 %   Basophils Absolute 0.0 0.0 - 0.1 K/uL   Immature Granulocytes 1 %   Abs Immature Granulocytes 0.04 0.00 - 0.07 K/uL    Comment: Performed at [facility] [address], [place], [place] [address]  Magnesium     Status: None   Collection Time: 03/24/22 10:44 AM  Result Value Ref Range   Magnesium 2.2 1.7 - 2.4 mg/dL    Comment: Performed at [facility] [address] [place], [place] [address]  C Difficile Quick Screen w PCR reflex     Status: None   Collection Time: 03/25/22 12:32 AM   Specimen: STOOL  Result Value Ref Range   C Diff antigen NEGATIVE NEGATIVE   C Diff toxin NEGATIVE NEGATIVE   C Diff interpretation No C. difficile detected.     Comment: Performed at [facility], [place] [address] [place], [place] [address]  Urinalysis, Routine w reflex microscopic STOOL     Status: Abnormal   Collection Time: 03/25/22 12:39 AM  Result Value Ref Range   Color, Urine YELLOW YELLOW   APPearance CLEAR CLEAR   Specific Gravity, Urine 1.012 1.005 - 1.030   pH 5.0 5.0 - 8.0   Glucose, UA NEGATIVE NEGATIVE mg/dL   Hgb urine dipstick SMALL (A) NEGATIVE   Bilirubin Urine NEGATIVE NEGATIVE   Ketones, ur NEGATIVE NEGATIVE mg/dL   Protein, ur NEGATIVE NEGATIVE mg/dL   Nitrite NEGATIVE NEGATIVE   Leukocytes,Ua NEGATIVE NEGATIVE   RBC / HPF 0-5 0 - 5 RBC/hpf   WBC, UA 0-5 0 - 5 WBC/hpf    Bacteria, UA NONE SEEN NONE SEEN   Squamous Epithelial / LPF 0-5 0 - 5   Mucus PRESENT    Hyaline Casts, UA PRESENT     Comment: Performed at [facility] [address], [place], [place] [address]  Basic metabolic panel     Status: Abnormal   Collection Time: 03/25/22  4:22 AM  Result Value Ref Range   Sodium 136 135 - 145 mmol/L   Potassium 4.0 3.5 - 5.1 mmol/L   Chloride 112 (H) 98 - 111 mmol/L   CO2 15 (L) 22 - 32 mmol/L   Glucose, Bld 94 70 - 99 mg/dL    Comment: Glucose reference range applies only to samples taken after fasting for at least 8 hours.   BUN 69 (H) 8 - 23 mg/dL   Creatinine, Ser 1.81 (H) 0.61 - 1.24 mg/dL   Calcium 8.1 (L) 8.9 - 10.3 mg/dL   GFR, Estimated 37 (L) >60 mL/min    Comment: (NOTE) Calculated using the CKD-EPI Creatinine Equation (2021)    Anion gap 9 5 - 15    Comment: Performed at [facility] [address], [place], [place] [address]  CBC     Status: Abnormal   Collection Time: 03/25/22  4:22 AM  Result Value Ref Range   WBC 6.5 4.0 - 10.5 K/uL   RBC  3.37 (L) 4.22 - 5.81 MIL/uL   Hemoglobin 11.6 (L) 13.0 - 17.0 g/dL   HCT 34.6 (L) 39.0 - 52.0 %   MCV 102.7 (H) 80.0 - 100.0 fL   MCH 34.4 (H) 26.0 - 34.0 pg   MCHC 33.5 30.0 - 36.0 g/dL   RDW 14.5 11.5 - 15.5 %   Platelets 137 (L) 150 - 400 K/uL   nRBC 0.0 0.0 - 0.2 %    Comment: Performed at [facility] [address], [place],  [address]   DG Chest 2 View  Result Date: 03/24/2022 CLINICAL DATA:  Shortness of breath, weakness and diarrhea. EXAM: CHEST - 2 VIEW COMPARISON:  11/17/2021 FINDINGS: Previous median sternotomy and CABG. Heart size is normal. Aortic atherosclerosis and tortuosity. The lungs are clear. No infiltrate, collapse or effusion. The pulmonary vascularity is normal. Old augmented compression fracture at L1. IMPRESSION: Previous CABG. No active cardiopulmonary disease. Aortic atherosclerosis. Electronically Signed   By: Nelson Chimes M.D.   On:  03/24/2022 11:10    Pending Labs Unresulted Labs (From admission, onward)     Start     Ordered   03/31/22 0500  Creatinine, serum  (enoxaparin (LOVENOX)    CrCl < 30 ml/min)  Once,   R       Comments: while on enoxaparin therapy.    03/24/22 2113   03/24/22 1040  Urine Culture  Once,   URGENT       Question:  Indication  Answer:  Sepsis   03/24/22 1039            Vitals/Pain Today's Vitals   03/25/22 1115 03/25/22 1130 03/25/22 1145 03/25/22 1200  BP: 117/63 122/64 (!) 126/115 118/73  Pulse: (!) 134 60 (!) 126 (!) 52  Resp: (!) 24 18 (!) 21 (!) 24  Temp:      TempSrc:      SpO2: 100% 100% 100% 100%  Weight:      Height:      PainSc:        Isolation Precautions Enteric precautions (UV disinfection)  Medications Medications  atorvastatin (LIPITOR) tablet 80 mg (has no administration in time range)  pantoprazole (PROTONIX) EC tablet 40 mg (40 mg Oral Given 03/25/22 0915)  carbidopa-levodopa (SINEMET IR) 25-100 MG per tablet immediate release 1 tablet (1 tablet Oral Given 03/25/22 0915)  enoxaparin (LOVENOX) injection 30 mg (30 mg Subcutaneous Given 03/24/22 2239)  sodium chloride flush (NS) 0.9 % injection 3 mL (3 mLs Intravenous Given 03/25/22 0917)  lactated ringers infusion ( Intravenous New Bag/Given 03/25/22 0914)  acetaminophen (TYLENOL) tablet 650 mg (650 mg Oral Given 03/24/22 2239)    Or  acetaminophen (TYLENOL) suppository 650 mg ( Rectal See Alternative 03/24/22 2239)  loperamide (IMODIUM) capsule 2 mg (has no administration in time range)  lactated ringers bolus 500 mL (0 mLs Intravenous Stopped 03/24/22 2108)  loperamide (IMODIUM) capsule 4 mg (4 mg Oral Given 03/25/22 0914)    Mobility walks with person assist Low fall risk   Focused Assessments Cardiac Assessment Handoff:    Lab Results  Component Value Date   TROPONINI <0.03 09/15/2015   No results found for: "DDIMER" Does the Patient currently have chest pain? No   , Pulmonary Assessment  Handoff:  Lung sounds:   O2 Device: Room Air      R Recommendations: See Admitting Provider Note

## 2022-03-25 NOTE — Evaluation (Signed)
Physical Therapy Evaluation Patient Details Name: Matthew Liu MRN: [id number] DOB: 02/19/1941 Today's Date: 03/25/2022  History of Present Illness  Pt is an 81 y/o male admitted secondary to weakness, diarrhea, and fatigue. Found to have AKI. PMH includes HTN, CVA, CAD s/p CABG, Parkinson's and neurocognitive decline.  Clinical Impression  Pt admitted secondary to problem above with deficits below. Pt requiring min guard A for mobility tasks this session. Mild unsteadiness noted and pt reports feeling weaker than normal. Pt's wife available to assist at d/c. Recommending HHPT at d/c to address current mobility deficits. Will continue to follow acutely.        Recommendations for follow up therapy are one component of a multi-disciplinary discharge planning process, led by the attending physician.  Recommendations may be updated based on patient status, additional functional criteria and insurance authorization.  Follow Up Recommendations Home health PT    Assistance Recommended at Discharge Intermittent Supervision/Assistance  Patient can return home with the following  Help with stairs or ramp for entrance;Assist for transportation;Assistance with cooking/housework    Equipment Recommendations None recommended by PT  Recommendations for Other Services       Functional Status Assessment Patient has had a recent decline in their functional status and demonstrates the ability to make significant improvements in function in a reasonable and predictable amount of time.     Precautions / Restrictions Precautions Precautions: Fall Restrictions Weight Bearing Restrictions: No      Mobility  Bed Mobility Overal bed mobility: Needs Assistance Bed Mobility: Supine to Sit, Sit to Supine     Supine to sit: Min guard Sit to supine: Min assist   General bed mobility comments: Assist for LE back onto bed    Transfers Overall transfer level: Needs assistance Equipment used:  None Transfers: Sit to/from Stand Sit to Stand: Min guard           General transfer comment: Min guard for safety    Ambulation/Gait Ambulation/Gait assistance: Min guard Gait Distance (Feet): 150 Feet Assistive device: IV Pole Gait Pattern/deviations: Step-through pattern, Decreased stride length Gait velocity: Decreased     General Gait Details: Slow, guarded gait. Mild unsteadiness noted with very short steps. Min guard for safety. Educated about using DME at home to increase safety.  Stairs            Wheelchair Mobility    Modified Rankin (Stroke Patients Only)       Balance Overall balance assessment: Needs assistance Sitting-balance support: No upper extremity supported, Feet supported Sitting balance-Leahy Scale: Good     Standing balance support: No upper extremity supported Standing balance-Leahy Scale: Fair                               Pertinent Vitals/Pain Pain Assessment Pain Assessment: No/denies pain    Home Living Family/patient expects to be discharged to:: Private residence Living Arrangements: Spouse/significant other Available Help at Discharge: Family Type of Home: House Home Access: Stairs to enter Entrance Stairs-Rails: None Entrance Stairs-Number of Steps: 1   Home Layout: One level Home Equipment: Cane - single Barista (2 wheels)      Prior Function Prior Level of Function : Independent/Modified Independent                     Hand Dominance   Dominant Hand: Right    Extremity/Trunk Assessment   Upper Extremity Assessment Upper Extremity Assessment: Defer  to OT evaluation    Lower Extremity Assessment Lower Extremity Assessment: Generalized weakness (functional weakness noted in hip flexors)    Cervical / Trunk Assessment Cervical / Trunk Assessment: Normal  Communication   Communication: HOH  Cognition Arousal/Alertness: Awake/alert Behavior During Therapy: WFL for tasks  assessed/performed Overall Cognitive Status: History of cognitive impairments - at baseline                                          General Comments General comments (skin integrity, edema, etc.): Pt's wife present during session    Exercises     Assessment/Plan    PT Assessment Patient needs continued PT services  PT Problem List Decreased strength;Decreased balance;Decreased activity tolerance;Decreased mobility;Decreased cognition       PT Treatment Interventions Gait training;DME instruction;Stair training;Therapeutic activities;Functional mobility training;Therapeutic exercise;Balance training;Patient/family education    PT Goals (Current goals can be found in the Care Plan section)  Acute Rehab PT Goals Patient Stated Goal: to go home PT Goal Formulation: With patient Time For Goal Achievement: 04/08/22 Potential to Achieve Goals: Good    Frequency Min 3X/week     Co-evaluation               AM-PAC PT "6 Clicks" Mobility  Outcome Measure Help needed turning from your back to your side while in a flat bed without using bedrails?: A Little Help needed moving from lying on your back to sitting on the side of a flat bed without using bedrails?: A Little Help needed moving to and from a bed to a chair (including a wheelchair)?: A Little Help needed standing up from a chair using your arms (e.g., wheelchair or bedside chair)?: A Little Help needed to walk in hospital room?: A Little Help needed climbing 3-5 steps with a railing? : A Little 6 Click Score: 18    End of Session Equipment Utilized During Treatment: Gait belt Activity Tolerance: Patient tolerated treatment well Patient left: in bed;with call bell/phone within reach;with family/visitor present (on stretcher in ED) Nurse Communication: Mobility status PT Visit Diagnosis: Unsteadiness on feet (R26.81);Muscle weakness (generalized) (M62.81)    Time: 3762-8315 PT Time Calculation  (min) (ACUTE ONLY): 17 min   Charges:   PT Evaluation $PT Eval Low Complexity: 1 Low          Reuel Derby, PT, DPT  Acute Rehabilitation Services  Office: [phone]   Rudean Hitt 03/25/2022, 10:03 AM

## 2022-03-25 NOTE — Evaluation (Signed)
Occupational Therapy Evaluation Patient Details Name: Matthew Liu MRN: [id number] DOB: [birth year] Today's Date: 03/25/2022   History of Present Illness Pt is an 81 y/o male admitted secondary to weakness, diarrhea, and fatigue. Found to have AKI. PMH includes HTN, CVA, CAD s/p CABG, Parkinson's and neurocognitive decline.   Clinical Impression   Patient admitted for the diagnosis above.  PTA he lives with his spouse, who is able to assist as needed.  Patient stating he continues to drive, and does not need any assist with ADL completion.  Wife assists with iADL.  Patient has been up and moving in the halls with staff.  He also walked with PT in the ED this morning.  He is needing generalized supervision for safety, but has no acute OT needs.  Encourage continued mobility in the acute setting.        Recommendations for follow up therapy are one component of a multi-disciplinary discharge planning process, led by the attending physician.  Recommendations may be updated based on patient status, additional functional criteria and insurance authorization.   Follow Up Recommendations  No OT follow up    Assistance Recommended at Discharge Set up Supervision/Assistance  Patient can return home with the following      Functional Status Assessment  Patient has not had a recent decline in their functional status  Equipment Recommendations  None recommended by OT    Recommendations for Other Services       Precautions / Restrictions Precautions Precautions: Fall Restrictions Weight Bearing Restrictions: No      Mobility Bed Mobility Overal bed mobility: Modified Independent                  Transfers Overall transfer level: Needs assistance Equipment used: None Transfers: Sit to/from Stand, Bed to chair/wheelchair/BSC Sit to Stand: Supervision     Step pivot transfers: Supervision            Balance Overall balance assessment: Needs assistance Sitting-balance  support: No upper extremity supported, Feet supported Sitting balance-Leahy Scale: Normal     Standing balance support: No upper extremity supported Standing balance-Leahy Scale: Fair                             ADL either performed or assessed with clinical judgement   ADL Overall ADL's : At baseline                                             Vision Patient Visual Report: No change from baseline       Perception Perception Perception: Not tested   Praxis Praxis Praxis: Not tested    Pertinent Vitals/Pain Pain Assessment Pain Assessment: No/denies pain     Hand Dominance Right   Extremity/Trunk Assessment Upper Extremity Assessment Upper Extremity Assessment: Overall WFL for tasks assessed   Lower Extremity Assessment Lower Extremity Assessment: Defer to PT evaluation   Cervical / Trunk Assessment Cervical / Trunk Assessment: Normal   Communication Communication Communication: HOH   Cognition Arousal/Alertness: Awake/alert Behavior During Therapy: WFL for tasks assessed/performed Overall Cognitive Status: History of cognitive impairments - at baseline  General Comments   HR to 95 with mobility    Exercises     Shoulder Instructions      Home Living Family/patient expects to be discharged to:: Private residence Living Arrangements: Spouse/significant other Available Help at Discharge: Family Type of Home: House Home Access: Stairs to enter Technical brewer of Steps: 1 Entrance Stairs-Rails: None Home Layout: One level     Bathroom Shower/Tub: Teacher, early years/pre: Standard Bathroom Accessibility: Yes How Accessible: Accessible via walker Home Equipment: [place] - single point;Rolling Walker (2 wheels);Shower seat          Prior Functioning/Environment Prior Level of Function : Independent/Modified Independent               ADLs  Comments: Spouse assists with meals and home management.        OT Problem List: Impaired balance (sitting and/or standing)      OT Treatment/Interventions:      OT Goals(Current goals can be found in the care plan section) Acute Rehab OT Goals Patient Stated Goal: Hoping to get home by Monday OT Goal Formulation: With patient Time For Goal Achievement: 04/01/22 Potential to Achieve Goals: Good  OT Frequency:      Co-evaluation              AM-PAC OT "6 Clicks" Daily Activity     Outcome Measure Help from another person eating meals?: None Help from another person taking care of personal grooming?: None Help from another person toileting, which includes using toliet, bedpan, or urinal?: A Little Help from another person bathing (including washing, rinsing, drying)?: A Little Help from another person to put on and taking off regular upper body clothing?: None Help from another person to put on and taking off regular lower body clothing?: A Little 6 Click Score: 21   End of Session Nurse Communication: Mobility status  Activity Tolerance: Patient tolerated treatment well Patient left: in chair;with call bell/phone within reach;with chair alarm set;with family/visitor present  OT Visit Diagnosis: Muscle weakness (generalized) (M62.81)                Time: 9983-3825 OT Time Calculation (min): 19 min Charges:  OT General Charges $OT Visit: 1 Visit OT Evaluation $OT Eval Moderate Complexity: 1 Mod  03/25/2022  RP, OTR/L  Acute Rehabilitation Services  Office:  [phone]   Metta Clines 03/25/2022, 4:48 PM

## 2022-03-25 NOTE — Progress Notes (Signed)
  Mobility Specialist Criteria Algorithm Info.   03/25/22 1543  Mobility  Activity Ambulated with assistance in hallway; dangled edge of bed  Range of Motion/Exercises Active;All extremities  Level of Assistance Standby assist, set-up cues, supervision of patient - no hands on  Assistive Device Other (Comment) (IV pole)  Distance Ambulated (ft) 300 ft  Activity Response Tolerated well   Patient received standing at the bedside agreeable to participate in mobility. Ambulated in hallway supervision level with steady gait. Returned to room without complaint or incident. Was left dangling edge of bed with all needs met, call bell in reach.   03/25/2022 5:13 PM  [place], [place], [place]  VHQIT:[phone] Office: [phone]

## 2022-03-25 NOTE — Progress Notes (Signed)
Patient came from the ED, no acute distress or pain noted, here for 2wk of diarrhea, from home, with wife and a daughter, no valuables, no home medications brought, diagnosed with AKI, continuous fluid LR 125 cc/hr, c-diff panel negative.

## 2022-03-25 NOTE — Progress Notes (Signed)
PROGRESS NOTE    Matthew Liu  OEV:[id number] DOB: 02/26/1941 DOA: 03/24/2022 PCP: Nicoletta Dress, MD    Brief Narrative:  Matthew Liu is a 81 y.o. male with past medical history significant of CAD status post CABG, hypertension, GERD, Barrett's esophagus, hyperlipidemia, PAD, CVA, OSA, Parkinson's with neurocognitive deficit, pulmonary fibrosis presented to the hospital with diarrhea fatigue and generalized weakness.  Patient has been having symptoms for the last 3 to 4 weeks since starting  Ofev, for pulmonary fibrosis with weakness fatigue diarrhea and decreased oral intake to the point he was unable to get out of the bed.  Patient contacted the pulmonary office who recommended to stop that medicine.  Patient then presented to the ED.  Lab work showed creatinine of 2.1 from baseline 1-1 0.3.  Urinalysis and urine culture was sent lab work-up included CMP with chloride 113, bicarb 16, BUN 78, creatinine elevated 2.1 from baseline of 1-1.3.  CBC within normal limits.  Urinalysis negative.  urine culture sent from the ED.  Chest x-ray showed no acute abnormality.  Magnesium was normal.  Patient received a 500 mL of IV fluid and was started on IV fluids and was admitted to the hospital for further evaluation and treatment.   Assessment and plan  Principal Problem:   AKI (acute kidney injury) ([place]) Active Problems:   HTN (hypertension)   Gastroesophageal reflux disease   Barrett's esophagus   Dyslipidemia   History of CVA (cerebrovascular accident)   OSA (obstructive sleep apnea)   PAD (peripheral artery disease) ([facility])   Coronary artery disease involving native coronary artery of native heart with unstable angina pectoris ([facility])   S/P CABG x 5   Parkinson's disease ([place])   Mild neurocognitive disorder due to Parkinson's disease ([place])   IPF (idiopathic pulmonary fibrosis) ([facility])   Diarrhea   Generalized weakness   Fatigue   Acute kidney injury secondary to volume depletion from  diarrhea. Creatinine was elevated 2.1 from baseline around 1-1 0.3.  Received IV fluid bolus followed by maintenance fluids.  Continue with normal saline at this time.  Hold hydrochlorothiazide and lisinopril for now.  Check BMP in AM.    Diarrhea/Fatigue/generalized weakness Patient with worsening diarrhea fatigue and weakness starting Ofev, for pulmonary fibrosis.  Has been advised to stop this medicine by pulmonary.  Stool for C. difficile was negative.  We will add loperamide continue IV fluids.  Continue to replenish electrolytes as needed.  Physical therapy evaluation  CAD/Hypertension Status post CABG x5.  Allergic to aspirin.  Continue Plavix, Lipitor, metoprolol and lisinopril on hold currently   Pulmonary fibrosis Discontinue Ofev due to diarrhea.  Patient as well as patient's wife at bedside states that they are not going to  try this medicine until pulmonary follow-up.  Hyperlipidemia/PAD/History of CVA Continue Lipitor and Plavix.  Parkinson's disease Continue Sinemet   Anxiety Continue as needed Xanax      DVT prophylaxis: enoxaparin (LOVENOX) injection 30 mg Start: 03/24/22 2200   Code Status:     Code Status: Full Code  Disposition: Home with home health  Status is: Observation  The patient will require care spanning > 2 midnights and should be moved to inpatient because: Acute kidney injury, IV fluid hydration, physical therapy evaluation, ongoing diarrhea and clinical improvement.   Family Communication: Communicated with the patient's wife at bedside.  Consultants:  None  Procedures:  None  Antimicrobials:  None  Anti-infectives (From admission, onward)    None  Subjective: Today, patient was seen and examined at bedside.  Continues to have diarrhea.  Denies any abdominal pain nausea vomiting fever or chills.  Feels hungry now.  Patient's wife at bedside.  Objective: Vitals:   03/25/22 1325 03/25/22 1330 03/25/22 1335 03/25/22 1357  BP:  116/65 (!) 127/56 103/76 (!) 171/80  Pulse: 94 90 92 98  Resp: 20 (!) 23 (!) 21 18  Temp:    97.9 F (36.6 C)  TempSrc:    Oral  SpO2: 100% 100% 100% 96%  Weight:      Height:        Intake/Output Summary (Last 24 hours) at 03/25/2022 1359 Last data filed at 03/24/2022 2108 Gross per 24 hour  Intake 500 ml  Output --  Net 500 ml   Filed Weights   03/24/22 1030  Weight: 75.8 kg    Physical Examination: Body mass index is 24.66 kg/m.   General:  Average built, not in obvious distress, elderly male, HENT:   No scleral pallor or icterus noted. Oral mucosa is moist.  Chest:  Clear breath sounds.  Diminished breath sounds bilaterally. No crackles or wheezes.  CVS: S1 &S2 heard. No murmur.  Regular rate and rhythm. Abdomen: Soft, nontender, nondistended.  Bowel sounds are heard.   Extremities: No cyanosis, clubbing or edema.  Peripheral pulses are palpable. Psych: Alert, awake and oriented, normal mood CNS:  No cranial nerve deficits.  Power equal in all extremities.   Skin: Warm and dry.  No rashes noted.  Data Reviewed:   CBC: Recent Labs  Lab 03/24/22 1044 03/25/22 0422  WBC 8.7 6.5  NEUTROABS 5.6  --   HGB 13.4 11.6*  HCT 40.0 34.6*  MCV 102.6* 102.7*  PLT 191 137*    Basic Metabolic Panel: Recent Labs  Lab 03/24/22 1044 03/25/22 0422  NA 137 136  K 4.8 4.0  CL 113* 112*  CO2 16* 15*  GLUCOSE 120* 94  BUN 78* 69*  CREATININE 2.10* 1.81*  CALCIUM 8.9 8.1*  MG 2.2  --     Liver Function Tests: Recent Labs  Lab 03/24/22 1044  AST 19  ALT 7  ALKPHOS 79  BILITOT 1.1  PROT 6.9  ALBUMIN 3.7     Radiology Studies: DG Chest 2 View  Result Date: 03/24/2022 CLINICAL DATA:  Shortness of breath, weakness and diarrhea. EXAM: CHEST - 2 VIEW COMPARISON:  11/17/2021 FINDINGS: Previous median sternotomy and CABG. Heart size is normal. Aortic atherosclerosis and tortuosity. The lungs are clear. No infiltrate, collapse or effusion. The pulmonary vascularity  is normal. Old augmented compression fracture at L1. IMPRESSION: Previous CABG. No active cardiopulmonary disease. Aortic atherosclerosis. Electronically Signed   By: Nelson Chimes M.D.   On: 03/24/2022 11:10      LOS: 0 days    Flora Lipps, MD [facility] Available via Epic secure chat 7am-7pm After these hours, please refer to coverage provider listed on [email or web address] 03/25/2022, 1:59 PM

## 2022-03-25 NOTE — Progress Notes (Signed)
Initial Nutrition Assessment  DOCUMENTATION CODES:   Not applicable  INTERVENTION:   -Ensure Enlive po BID, each supplement provides 350 kcal and 20 grams of protein -MVI with minerals daily - Liberalize diet to 2 gram sodium for wider variety of meal selections  NUTRITION DIAGNOSIS:   Inadequate oral intake related to decreased appetite as evidenced by per patient/family report.  GOAL:   Patient will meet greater than or equal to 90% of their needs  MONITOR:   PO intake, Supplement acceptance  REASON FOR ASSESSMENT:   Malnutrition Screening Tool    ASSESSMENT:   Pt with past medical history significant of CAD status post CABG, hypertension, GERD, Barrett's esophagus, hyperlipidemia, PAD, CVA, OSA, Parkinson's with neurocognitive deficit, pulmonary fibrosis presented to the hospital with diarrhea fatigue and generalized weakness  Pt admitted with AKI secondary to volume depletions from diarrhea.   Reviewed I/O's: +500 ml x 24 hours  Spoke with pt daughter over the phone. She reports pt with decreased oral intake for two days PTA secondary to diarrhea. Pt ate a few bites of his breakfast and about 50% of his lunch per daughter's report. Prior to acute illness, pt with fair appetite and generally consumes 2 meals per day.   Pt UBW is around 165#, which he last weighed about 2 weeks ago. Daughter estimated that he has lost about 5 pounds over the past 2 weeks secondary to decreased oral intake and diarrhea. Reviewed wt hx; pt has experienced a 5.2% wt loss over the past 3 months, which is not significant for time frame.   Discussed importance of good meal and supplement intake to promote healing. Pt daughter reports pt has consumed Boost in the past, but is inconsistent with it. He is amenable to drinking in the hospital.   Medications reviewed and include sinemet and lactated ringers infusion @ 125 ml/hr.   Labs reviewed: CBGS: 120.   Diet Order:   Diet Order              Diet 2 gram sodium Room service appropriate? Yes; Fluid consistency: Thin  Diet effective now                   EDUCATION NEEDS:   Education needs have been addressed  Skin:  Skin Assessment: Reviewed RN Assessment  Last BM:  03/25/22  Height:   Ht Readings from Last 1 Encounters:  03/24/22 '5\' 9"'$  (1.753 m)    Weight:   Wt Readings from Last 1 Encounters:  03/25/22 72.5 kg    Ideal Body Weight:  72.7 kg  BMI:  Body mass index is 23.6 kg/m.  Estimated Nutritional Needs:   Kcal:  2000-2200  Protein:  105-120 grams  Fluid:  > 2 L    Loistine Chance, RD, LDN, [place] Registered Dietitian II Certified Diabetes Care and Education Specialist Please refer to [facility] for RD and/or RD on-call/weekend/after hours pager

## 2022-03-26 DIAGNOSIS — K219 Gastro-esophageal reflux disease without esophagitis: Secondary | ICD-10-CM | POA: Diagnosis not present

## 2022-03-26 DIAGNOSIS — N179 Acute kidney failure, unspecified: Secondary | ICD-10-CM | POA: Diagnosis not present

## 2022-03-26 DIAGNOSIS — Z8673 Personal history of transient ischemic attack (TIA), and cerebral infarction without residual deficits: Secondary | ICD-10-CM | POA: Diagnosis not present

## 2022-03-26 DIAGNOSIS — E785 Hyperlipidemia, unspecified: Secondary | ICD-10-CM | POA: Diagnosis not present

## 2022-03-26 DIAGNOSIS — K22719 Barrett's esophagus with dysplasia, unspecified: Secondary | ICD-10-CM | POA: Diagnosis not present

## 2022-03-26 DIAGNOSIS — R5383 Other fatigue: Secondary | ICD-10-CM | POA: Diagnosis not present

## 2022-03-26 DIAGNOSIS — R197 Diarrhea, unspecified: Secondary | ICD-10-CM | POA: Diagnosis not present

## 2022-03-26 DIAGNOSIS — R531 Weakness: Secondary | ICD-10-CM | POA: Diagnosis not present

## 2022-03-26 DIAGNOSIS — I25110 Atherosclerotic heart disease of native coronary artery with unstable angina pectoris: Secondary | ICD-10-CM | POA: Diagnosis not present

## 2022-03-26 LAB — CBC
HCT: 32.4 % — ABNORMAL LOW (ref 39.0–52.0)
Hemoglobin: 11.7 g/dL — ABNORMAL LOW (ref 13.0–17.0)
MCH: 35.5 pg — ABNORMAL HIGH (ref 26.0–34.0)
MCHC: 36.1 g/dL — ABNORMAL HIGH (ref 30.0–36.0)
MCV: 98.2 fL (ref 80.0–100.0)
Platelets: 140 10*3/uL — ABNORMAL LOW (ref 150–400)
RBC: 3.3 MIL/uL — ABNORMAL LOW (ref 4.22–5.81)
RDW: 14.5 % (ref 11.5–15.5)
WBC: 6.4 10*3/uL (ref 4.0–10.5)
nRBC: 0 % (ref 0.0–0.2)

## 2022-03-26 LAB — BASIC METABOLIC PANEL
Anion gap: 8 (ref 5–15)
BUN: 34 mg/dL — ABNORMAL HIGH (ref 8–23)
CO2: 19 mmol/L — ABNORMAL LOW (ref 22–32)
Calcium: 8.2 mg/dL — ABNORMAL LOW (ref 8.9–10.3)
Chloride: 111 mmol/L (ref 98–111)
Creatinine, Ser: 1.23 mg/dL (ref 0.61–1.24)
GFR, Estimated: 59 mL/min — ABNORMAL LOW (ref >60)
Glucose, Bld: 106 mg/dL — ABNORMAL HIGH (ref 70–99)
Potassium: 4.1 mmol/L (ref 3.5–5.1)
Sodium: 138 mmol/L (ref 135–145)

## 2022-03-26 LAB — MAGNESIUM: Magnesium: 1.7 mg/dL (ref 1.7–2.4)

## 2022-03-26 MED ORDER — LOPERAMIDE HCL 2 MG PO CAPS: 2.0000 mg | ORAL_CAPSULE | ORAL | 0 refills | Status: DC | PRN

## 2022-03-26 MED ORDER — LISINOPRIL 40 MG PO TABS: 20.0000 mg | ORAL_TABLET | Freq: Every day | ORAL | Status: DC

## 2022-03-26 MED ORDER — HYDROCHLOROTHIAZIDE 12.5 MG PO CAPS: ORAL_CAPSULE | ORAL | Status: DC

## 2022-03-26 NOTE — Discharge Summary (Signed)
Physician Discharge Summary   Patient: Matthew Liu MRN: [id number] DOB: 1941/03/02  Admit date:     03/24/2022  Discharge date: 03/26/22  Discharge Physician: Corrie Mckusick Gabriella Guile   PCP: Nicoletta Dress, MD   Recommendations at discharge:   Follow-up with your primary care physician in 1 week.   Check CBC, BMP, magnesium, LFT in the next visit. Follow-up with with the pulmonary as scheduled for discussion about further treatment of IPF.  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 male with past medical history significant of CAD status post CABG, hypertension, GERD, Barrett's esophagus, hyperlipidemia, PAD, CVA, OSA, Parkinson's with neurocognitive deficit, pulmonary fibrosis presented to the hospital with diarrhea, fatigue and generalized weakness.  Patient has been having symptoms for the last 3 to 4 weeks since starting  Ofev to the point he was unable to get out of the bed.  Patient contacted the pulmonary office who recommended to ofev and patient then presented to the ED. Lab work showed creatinine of 2.1 from baseline 1-1 0.3.  Urinalysis and urine culture was sent, lab work-up included CMP with chloride 113, bicarb 16, BUN 78, creatinine elevated 2.1 from baseline of 1-1.3.  CBC within normal limits.  Urinalysis was negative.   Urine culture sent from the ED.  Chest x-ray showed no acute abnormality.  Magnesium was normal.  Patient received a 500 mL of IV fluid and was started on IV fluids and was admitted to the hospital for further evaluation and treatment.   Assessment and plan  Acute kidney injury secondary to volume depletion from diarrhea. Creatinine was elevated 2.1 from baseline around 1-1 0.3.  Received IV fluid bolus followed by maintenance fluids.  Creatinine has significantly improved at this time.  Creatinine prior to discharge was 1.2.  Diarrhea/Fatigue/generalized weakness Improved at this time.  Patient with worsening diarrhea fatigue and weakness starting Ofev, for pulmonary fibrosis.  Received loperamide for diarrhea.  We will prescribe some on discharge as needed.  Patient feels much stronger.  Has been seen by physical therapy who recommended home PT on discharge.    CAD/Hypertension Status post CABG x5.  Allergic to aspirin.  Continue Plavix, Lipitor, metoprolol.  Resume hydrochlorothiazide and lisinopril from 03/28/2022.   Pulmonary fibrosis Discontinue Ofev due to diarrhea.  Patient does have an appointment with pulmonary to discuss about further treatment plan.  Hyperlipidemia/PAD/History of CVA Continue Lipitor and Plavix.  Parkinson's disease Continue Sinemet   Anxiety Continue as needed Xanax  Consultants: None Procedures performed: None Disposition: Home health Diet recommendation:  Discharge Diet Orders (From admission, onward)     Start     Ordered   03/26/22 0000  Diet - low sodium heart healthy        03/26/22 0810           Cardiac diet  DISCHARGE MEDICATION: Allergies as of 03/26/2022       Reactions   Aspirin Hives, Shortness Of Breath   "Shortness of Breath" can't breathe   Quinine Derivatives Hives, Other (See Comments)   "looks like he has the measles"        Medication List     STOP taking these medications    Ofev 150 MG Caps Generic drug:  Nintedanib       TAKE these medications    acetaminophen 500 MG tablet Commonly known as: TYLENOL Take 1 tablet (500 mg total) by mouth every 6 (six) hours as needed for mild pain or headache.   ALPRAZolam 0.25 MG tablet Commonly known as: XANAX Take 0.25 mg by mouth 2 (two) times daily as needed for anxiety.   atorvastatin 80 MG tablet Commonly known as: LIPITOR Take 1 tablet (80 mg total) by mouth every evening.   carbidopa-levodopa 25-100 MG tablet Commonly known as: SINEMET IR Take 1 tablet by mouth 3 (three) times daily. Take 1 tablet three times daily   clopidogrel 75 MG tablet Commonly known as: PLAVIX TAKE 1 TABLET BY MOUTH EVERY DAY   hydrochlorothiazide 12.5 MG capsule Commonly known as: MICROZIDE TAKE 1 CAPSULE BY MOUTH EVERY DAY Start taking on: March 28, 2022 What changed: These instructions start on March 28, 2022. If you are unsure what to do until then, ask your doctor or other care provider.   lisinopril 40 MG tablet Commonly known as: ZESTRIL Take 0.5 tablets (20 mg total) by mouth at bedtime. Start taking on: March 28, 2022 What changed: These instructions start on March 28, 2022. If you are unsure what to do until then, ask your doctor or other care provider.   loperamide 2 MG capsule Commonly known as: IMODIUM Take 1 capsule (2 mg total) by mouth as needed for diarrhea or loose stools.   metoprolol tartrate 25 MG tablet Commonly known as: LOPRESSOR Take 0.5 tablets (12.5 mg total) by mouth 2 (two) times daily.   mirtazapine 7.5 MG tablet Commonly known as: REMERON Take 7.5 mg by mouth at bedtime.   One-A-Day Mens 50+ Tabs Take 1 tablet by mouth daily with breakfast.   pantoprazole 40 MG tablet Commonly known as: PROTONIX Take 1 tablet (40 mg total) by mouth daily.   Vitamin D3 50 MCG (2000 UT) capsule Take 2,000 Units by mouth daily.        Follow-up Information     Nicoletta Dress, MD Follow up in 1 week(s).   Specialty: Internal  Medicine Contact information: [place] [address] [phone]         Belva Crome, MD .   Specialty: Cardiology Contact information: [phone] [place] [address] [phone]                Subjective.  Patient feels better today.  No nausea, vomiting.  Eating oral diet.  Diarrhea has improved.  No abdominal pain.  Wishes to go home.  Patient's wife at bedside.  Discharge Exam: Filed Weights   03/24/22 1030 03/25/22 1357  Weight: 75.8 kg 72.5 kg      03/26/2022    5:34 AM 03/26/2022    2:04 AM 03/25/2022    8:57 PM  Vitals with BMI  Systolic [phone]  Diastolic 64 74 79  Pulse 83 52 94    General:  Average built, not in obvious distress elderly male, not in obvious distress.  HENT:   No scleral pallor or icterus noted. Oral mucosa is moist.  Chest:  Clear breath sounds.  Diminished breath sounds bilaterally. No crackles or wheezes.  CVS: S1 &S2 heard. No murmur.  Regular rate and rhythm. Abdomen: Soft, nontender, nondistended.  Bowel sounds are heard.   Extremities: No cyanosis, clubbing or edema.  Peripheral pulses are palpable. Psych: Alert, awake and oriented, normal mood CNS:  No cranial nerve deficits.  Power equal in all extremities.   Skin: Warm and dry.  No rashes noted.  Condition at discharge: good  The results of significant diagnostics from this hospitalization (including imaging, microbiology, ancillary and laboratory) are listed below for reference.   Imaging Studies: DG Chest 2 View  Result Date: 03/24/2022 CLINICAL DATA:  Shortness of breath, weakness and diarrhea. EXAM: CHEST - 2 VIEW COMPARISON:  11/17/2021 FINDINGS: Previous median sternotomy and CABG. Heart size is normal. Aortic atherosclerosis and tortuosity. The lungs are clear. No infiltrate, collapse or effusion. The pulmonary vascularity is normal. Old augmented compression fracture at L1. IMPRESSION: Previous CABG. No  active cardiopulmonary disease. Aortic atherosclerosis. Electronically Signed   By: Nelson Chimes M.D.   On: 03/24/2022 11:10    Microbiology: Results for orders placed or performed during the hospital encounter of 03/24/22  C Difficile Quick Screen w PCR reflex     Status: None   Collection Time: 03/25/22 12:32 AM   Specimen: STOOL  Result Value Ref Range Status   C Diff antigen NEGATIVE NEGATIVE Final   C Diff toxin NEGATIVE NEGATIVE Final   C Diff interpretation No C. difficile detected.  Final    Comment: Performed at [facility], [place] [address] [place], [place] [address]  Urine Culture     Status: None   Collection Time: 03/25/22 12:39 AM   Specimen: Urine, Clean Catch  Result Value Ref Range Status   Specimen Description URINE, CLEAN CATCH  Final   Special Requests NONE  Final   Culture   Final    NO GROWTH Performed at [facility], [place] [address], [place], [place] [address]    Report Status 03/25/2022 FINAL  Final    Labs: CBC: Recent Labs  Lab 03/24/22 1044 03/25/22 0422 03/26/22 0322  WBC 8.7 6.5 6.4  NEUTROABS 5.6  --   --   HGB 13.4 11.6* 11.7*  HCT 40.0 34.6* 32.4*  MCV 102.6* 102.7* 98.2  PLT 191 137* 034*   Basic Metabolic Panel: Recent Labs  Lab 03/24/22 1044 03/25/22 0422 03/26/22 0322  NA 137 136 138  K 4.8 4.0 4.1  CL 113* 112* 111  CO2 16* 15* 19*  GLUCOSE 120* 94 106*  BUN 78* 69* 34*  CREATININE 2.10* 1.81* 1.23  CALCIUM 8.9 8.1* 8.2*  MG 2.2  --  1.7   Liver Function Tests: Recent Labs  Lab 03/24/22 1044  AST 19  ALT 7  ALKPHOS 79  BILITOT 1.1  PROT 6.9  ALBUMIN 3.7   CBG: No results for input(s): "GLUCAP" in the last 168 hours.  Discharge time spent: greater than 30 minutes.  Signed: Flora Lipps, MD [facility] 03/26/2022

## 2022-03-26 NOTE — Progress Notes (Signed)
Discharge orders are in. This RN removed telemetry, peripheral IV, and gave/went over discharge instructions with the patient. Pt stated that he understood discharge instructions and did not have any questions. Pt was given a copy to take home. Wife has gone to get the car at this time.  Matthew Liu

## 2022-03-28 NOTE — Telephone Encounter (Signed)
I called and discussed ED results with the patient. Nothing further needed.

## 2022-03-30 ENCOUNTER — Encounter: Payer: Self-pay | Admitting: Pulmonary Disease

## 2022-03-30 ENCOUNTER — Ambulatory Visit: Payer: Medicare PPO | Admitting: Pulmonary Disease

## 2022-03-30 VITALS — BP 128/80 | HR 79 | Temp 98.1°F | Ht 67.0 in | Wt 168.2 lb

## 2022-03-30 DIAGNOSIS — Z5181 Encounter for therapeutic drug level monitoring: Secondary | ICD-10-CM

## 2022-03-30 DIAGNOSIS — J849 Interstitial pulmonary disease, unspecified: Secondary | ICD-10-CM | POA: Diagnosis not present

## 2022-03-30 NOTE — Patient Instructions (Signed)
I am glad you are better after your recent visit to the emergency room Please hold off on restarting the Ofev since she had a reaction to it Will order high-res CT and PFTs in 3 months return to clinic after these tests to review and plan for next

## 2022-03-30 NOTE — Progress Notes (Signed)
Matthew Liu    [id number]    1940-11-12  Primary Care Physician:Schultz, Lora Havens, MD  Referring Physician: Nicoletta Dress, MD [place] [place],  [place] [address]  Chief complaint: Follow-up for interstitial lung disease  HPI: 81 year old with history of hypertension, coronary artery disease, hyperlipidemia, allergies, GERD He had an abnormal x-ray at his primary care with a follow-up CT showing UIP pattern pulmonary fibrosis at [facility] and has been referred here for further evaluation Complains of mild dyspnea on exertion.  No cough or fevers or chills  He is also being worked up for sleep apnea.  A previous sleep study was nondiagnostic as he did not sleep and he has a repeat in lab sleep study on 08/27/2021.  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  No feather pillows or comforter ILD questionnaire 08/26/2021-negative Smoking history: Never smoker Travel history: No significant travel history Relevant family history: No family history of lung disease  Interim history: Started on Ofev in February 2023 This was poorly tolerated with significant fatigue, diarrhea.  He was eventually treated in the ED in 03/24/2022 with labs showing elevated creatinine.  He was given IV fluids and discharged the next day.  His follow-up creatinine has normalized He is feeling better now but not completely back to normal  Outpatient Encounter Medications as of 03/30/2022  Medication Sig   acetaminophen (TYLENOL) 500 MG tablet Take 1 tablet (500 mg total) by mouth every 6 (six) hours as needed for mild pain or headache.   ALPRAZolam (XANAX) 0.25 MG tablet Take 0.25 mg by mouth 2 (two) times daily as needed for anxiety.    atorvastatin (LIPITOR) 80 MG tablet Take 1 tablet (80 mg total) by mouth every evening.   carbidopa-levodopa (SINEMET IR) 25-100 MG tablet Take 1 tablet by mouth 3  (three) times daily. Take 1 tablet three times daily   Cholecalciferol (VITAMIN D3) 50 MCG (2000 UT) capsule Take 2,000 Units by mouth daily.   clopidogrel (PLAVIX) 75 MG tablet TAKE 1 TABLET BY MOUTH EVERY DAY (Patient taking differently: Take 75 mg by mouth daily.)   hydrochlorothiazide (MICROZIDE) 12.5 MG capsule TAKE 1 CAPSULE BY MOUTH EVERY DAY   lisinopril (ZESTRIL) 40 MG tablet Take 0.5 tablets (20 mg total) by mouth at bedtime.   metoprolol tartrate (LOPRESSOR) 25 MG tablet Take 0.5 tablets (12.5 mg total) by mouth 2 (two) times daily.   mirtazapine (REMERON) 7.5 MG tablet Take 7.5 mg by mouth at bedtime.   Multiple Vitamins-Minerals (ONE-A-DAY MENS 50+) TABS Take 1 tablet by mouth daily with breakfast.   pantoprazole (PROTONIX) 40 MG tablet Take 1 tablet (40 mg total) by mouth daily.   [DISCONTINUED] loperamide (IMODIUM) 2 MG capsule Take 1 capsule (2 mg total) by mouth as needed for diarrhea or loose stools.   No facility-administered encounter medications on file as of 03/30/2022.   Physical Exam: Blood pressure 128/80, pulse 79, temperature 98.1 F (36.7 C), temperature source Oral, height '5\' 7"'$  (1.702 m), weight 168 lb 3.2 oz (76.3 kg), SpO2 97 %. Gen:      No acute distress HEENT:  EOMI, sclera anicteric Neck:     No masses; no thyromegaly Lungs:    Clear to auscultation bilaterally; normal respiratory effort CV:         Regular rate and rhythm; no murmurs Abd:      +  bowel sounds; soft, non-tender; no palpable masses, no distension Ext:    No edema; adequate peripheral perfusion Skin:      Warm and dry; no rash Neuro: alert and oriented x 3 Psych: normal mood and affect   Data Reviewed: Imaging: CT chest 01/08/2018- basal predominant traction bronchiectasis, reticulation High-res CT 06/23/2021-mild progression of basilar predominant fibrotic changes with minimal honeycombing in the right middle lobe base.  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  High suspicion for IPF He does not have significant exposures or signs and symptoms of connective tissue disease, CTD serologies are negative  We discussed his diagnosis and trajectory of disease.  We reviewed treatment options such as antifibrotics to slow down the progression of disease.    We started him on Ofev but that is poorly tolerated with significant diarrhea, fatigue and dehydration He is still recovering after his recent ED visit and we will hold off on resuming the medication Recheck CT and PFTs in 3 months and return to clinic to discuss if he wants to go on Esbriet as an alternate  OSA On CPAP.  Managed by Dr. Radford Pax, cardiology  Plan/Recommendations: High-res CT, PFTs  Marshell Garfinkel MD [facility] 03/30/2022, 2:37 PM  CC: Nicoletta Dress, MD

## 2022-04-05 DIAGNOSIS — N179 Acute kidney failure, unspecified: Secondary | ICD-10-CM | POA: Diagnosis not present

## 2022-04-05 DIAGNOSIS — Z139 Encounter for screening, unspecified: Secondary | ICD-10-CM | POA: Diagnosis not present

## 2022-04-05 DIAGNOSIS — J8410 Pulmonary fibrosis, unspecified: Secondary | ICD-10-CM | POA: Diagnosis not present

## 2022-04-05 DIAGNOSIS — R531 Weakness: Secondary | ICD-10-CM | POA: Diagnosis not present

## 2022-04-05 DIAGNOSIS — R197 Diarrhea, unspecified: Secondary | ICD-10-CM | POA: Diagnosis not present

## 2022-04-11 ENCOUNTER — Other Ambulatory Visit: Payer: Self-pay | Admitting: Interventional Cardiology

## 2022-04-12 ENCOUNTER — Other Ambulatory Visit: Payer: Self-pay | Admitting: Gastroenterology

## 2022-04-22 ENCOUNTER — Other Ambulatory Visit: Payer: Self-pay | Admitting: Physician Assistant

## 2022-04-26 ENCOUNTER — Other Ambulatory Visit: Payer: Self-pay | Admitting: Interventional Cardiology

## 2022-05-17 ENCOUNTER — Telehealth: Payer: Self-pay | Admitting: Pulmonary Disease

## 2022-05-17 ENCOUNTER — Telehealth: Payer: Self-pay | Admitting: Cardiology

## 2022-05-17 NOTE — Telephone Encounter (Signed)
Patient calling in to get rid of his cpap machine. He states is not working for him. Please advise

## 2022-05-17 NOTE — Telephone Encounter (Signed)
Patient called today to say he was turning in his cpap machine. He states it is driving his wife crazy and he feels it is not working for him. He does not want it any more.

## 2022-05-19 NOTE — Telephone Encounter (Signed)
Attempted to call pt but line went directly to VM. Left message for him to return call.

## 2022-05-25 NOTE — Telephone Encounter (Signed)
Attempted to call pt but unable to reach. Left message for him to return call. Due to multiple attempts to try to reach pt and unable to reach, encounter will be closed per protocol.

## 2022-05-31 DIAGNOSIS — K5900 Constipation, unspecified: Secondary | ICD-10-CM | POA: Diagnosis not present

## 2022-06-09 DIAGNOSIS — K5909 Other constipation: Secondary | ICD-10-CM | POA: Diagnosis not present

## 2022-07-06 ENCOUNTER — Ambulatory Visit: Payer: Medicare PPO | Admitting: Pulmonary Disease

## 2022-07-06 ENCOUNTER — Ambulatory Visit (INDEPENDENT_AMBULATORY_CARE_PROVIDER_SITE_OTHER): Payer: Medicare PPO | Admitting: Pulmonary Disease

## 2022-07-06 ENCOUNTER — Encounter: Payer: Self-pay | Admitting: Pulmonary Disease

## 2022-07-06 VITALS — BP 124/72 | HR 67 | Temp 97.9°F | Ht 69.0 in | Wt 167.0 lb

## 2022-07-06 DIAGNOSIS — R0602 Shortness of breath: Secondary | ICD-10-CM

## 2022-07-06 DIAGNOSIS — J849 Interstitial pulmonary disease, unspecified: Secondary | ICD-10-CM | POA: Diagnosis not present

## 2022-07-06 DIAGNOSIS — Z5181 Encounter for therapeutic drug level monitoring: Secondary | ICD-10-CM | POA: Diagnosis not present

## 2022-07-06 LAB — PULMONARY FUNCTION TEST
DL/VA % pred: 73 %
DL/VA: 2.83 ml/min/mmHg/L
DLCO cor % pred: 51 %
DLCO cor: 12.11 ml/min/mmHg
DLCO unc % pred: 51 %
DLCO unc: 12.11 ml/min/mmHg
FEF 25-75 Post: 1.79 L/sec
FEF 25-75 Pre: 3.15 L/sec
FEF2575-%Change-Post: -43 %
FEF2575-%Pred-Post: 100 %
FEF2575-%Pred-Pre: 176 %
FEV1-%Change-Post: -9 %
FEV1-%Pred-Post: 79 %
FEV1-%Pred-Pre: 87 %
FEV1-Post: 2.1 L
FEV1-Pre: 2.33 L
FEV1FVC-%Change-Post: 7 %
FEV1FVC-%Pred-Pre: 119 %
FEV6-%Change-Post: -16 %
FEV6-%Pred-Post: 65 %
FEV6-%Pred-Pre: 78 %
FEV6-Post: 2.3 L
FEV6-Pre: 2.74 L
FEV6FVC-%Change-Post: 0 %
FEV6FVC-%Pred-Post: 107 %
FEV6FVC-%Pred-Pre: 107 %
FVC-%Change-Post: -16 %
FVC-%Pred-Post: 60 %
FVC-%Pred-Pre: 72 %
FVC-Post: 2.3 L
FVC-Pre: 2.74 L
Post FEV1/FVC ratio: 91 %
Post FEV6/FVC ratio: 100 %
Pre FEV1/FVC ratio: 85 %
Pre FEV6/FVC Ratio: 100 %
RV % pred: 93 %
RV: 2.46 L
TLC % pred: 79 %
TLC: 5.49 L

## 2022-07-06 NOTE — Patient Instructions (Signed)
We will check labs today including comprehensive metabolic panel, proBNP for dyspnea Start paperwork for pirfenidone. Follow-up in 3 months.

## 2022-07-06 NOTE — Progress Notes (Signed)
Full PFT performed today. °

## 2022-07-06 NOTE — Progress Notes (Signed)
Matthew Liu    [id number]    [birth year]  Primary Care Physician:Schultz, Lora Havens, MD  Referring Physician: Nicoletta Dress, MD [place],  [place] [address]  Chief complaint: Follow-up for interstitial lung disease  HPI: 81 y.o.  with history of hypertension, coronary artery disease, hyperlipidemia, allergies, GERD He had an abnormal x-ray at his primary care with a follow-up CT showing UIP pattern pulmonary fibrosis at [facility] and has been referred here for further evaluation Complains of mild dyspnea on exertion.  No cough or fevers or chills  He is also being worked up for sleep apnea.  A previous sleep study was nondiagnostic as he did not sleep and he has a repeat in lab sleep study on 08/27/2021.  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  No feather pillows or comforter ILD questionnaire 08/26/2021-negative Smoking history: Never smoker Travel history: No significant travel history Relevant family history: No family history of lung disease  Interim history: Started on Ofev in February 2023 This was poorly tolerated with significant fatigue, diarrhea.  He was eventually treated in the ED in 03/24/2022 with labs showing elevated creatinine.  He was given IV fluids and discharged the next day.  His follow-up creatinine has normalized  Here for review of CT and PFTs States that breathing is stable.   Outpatient Encounter Medications as of 07/06/2022  Medication Sig   acetaminophen (TYLENOL) 500 MG tablet Take 1 tablet (500 mg total) by mouth every 6 (six) hours as needed for mild pain or headache.   ALPRAZolam (XANAX) 0.25 MG tablet Take 0.25 mg by mouth 2 (two) times daily as needed for anxiety.    atorvastatin (LIPITOR) 80 MG tablet Take 1 tablet (80 mg total) by mouth every evening.   carbidopa-levodopa (SINEMET IR) 25-100 MG tablet TAKE 1 TABLET BY  MOUTH 3 (THREE) TIMES DAILY.   Cholecalciferol (VITAMIN D3) 50 MCG (2000 UT) capsule Take 2,000 Units by mouth daily.   clopidogrel (PLAVIX) 75 MG tablet TAKE 1 TABLET BY MOUTH EVERY DAY (Patient taking differently: Take 75 mg by mouth daily.)   hydrochlorothiazide (MICROZIDE) 12.5 MG capsule TAKE 1 CAPSULE BY MOUTH EVERY DAY   lisinopril (ZESTRIL) 40 MG tablet Take 0.5 tablets (20 mg total) by mouth at bedtime.   metoprolol tartrate (LOPRESSOR) 25 MG tablet TAKE 0.5 TABLETS BY MOUTH 2 TIMES DAILY.   mirtazapine (REMERON) 7.5 MG tablet Take 7.5 mg by mouth at bedtime.   Multiple Vitamins-Minerals (ONE-A-DAY MENS 50+) TABS Take 1 tablet by mouth daily with breakfast.   pantoprazole (PROTONIX) 40 MG tablet Take 1 tablet (40 mg total) by mouth daily. Please call [phone] to schedule an office visit for more refills   No facility-administered encounter medications on file as of 07/06/2022.   Physical Exam: Blood pressure 124/72, pulse 67, temperature 97.9 F (36.6 C), temperature source Oral, height '5\' 9"'$  (1.753 m), weight 167 lb (75.8 kg), SpO2 96 %. Gen:      No acute distress HEENT:  EOMI, sclera anicteric Neck:     No masses; no thyromegaly Lungs:    Clear to auscultation bilaterally; normal respiratory effort CV:         Regular rate and rhythm; no murmurs Abd:      + bowel sounds; soft, non-tender; no palpable masses, no distension Ext:    No edema; adequate peripheral perfusion Skin:  Warm and dry; no rash Neuro: alert and oriented x 3 Psych: normal mood and affect   Data Reviewed: Imaging: CT chest 01/08/2018- basal predominant traction bronchiectasis, reticulation High-res CT 06/23/2021-mild progression of basilar predominant fibrotic changes with minimal honeycombing in the right middle lobe base.  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  High suspicion for IPF He does not have significant exposures or signs and symptoms of connective tissue disease, CTD serologies are negative  We discussed his diagnosis and trajectory of disease.  We reviewed treatment options such as antifibrotics to slow down the progression of disease.    Started him on Ofev but that is poorly tolerated with significant diarrhea, fatigue and dehydration We discussed alternate treatment and have decided to start paperwork for pirfenidone Check comprehensive metabolic panel and proBNP for dyspnea today  OSA On CPAP.  Managed by Dr. Radford Pax, cardiology  Plan/Recommendations: Start paperwork for pirfenidone Labs for monitoring.  Marshell Garfinkel MD [facility] 07/06/2022, 4:06 PM  CC: Nicoletta Dress, MD

## 2022-07-06 NOTE — Patient Instructions (Signed)
Full PFT performed today. °

## 2022-07-07 LAB — COMPREHENSIVE METABOLIC PANEL
ALT: 13 U/L (ref 0–53)
AST: 20 U/L (ref 0–37)
Albumin: 4.3 g/dL (ref 3.5–5.2)
Alkaline Phosphatase: 89 U/L (ref 39–117)
BUN: 31 mg/dL — ABNORMAL HIGH (ref 6–23)
CO2: 29 mEq/L (ref 19–32)
Calcium: 9.7 mg/dL (ref 8.4–10.5)
Chloride: 104 mEq/L (ref 96–112)
Creatinine, Ser: 1.28 mg/dL (ref 0.40–1.50)
GFR: 52.17 mL/min — ABNORMAL LOW (ref >60.00)
Glucose, Bld: 105 mg/dL — ABNORMAL HIGH (ref 70–99)
Potassium: 4.8 mEq/L (ref 3.5–5.1)
Sodium: 140 mEq/L (ref 135–145)
Total Bilirubin: 0.7 mg/dL (ref 0.2–1.2)
Total Protein: 7.6 g/dL (ref 6.0–8.3)

## 2022-07-07 LAB — PRO B NATRIURETIC PEPTIDE: NT-Pro BNP: 518 pg/mL — ABNORMAL HIGH (ref 0–486)

## 2022-07-08 ENCOUNTER — Other Ambulatory Visit (HOSPITAL_COMMUNITY): Payer: Self-pay

## 2022-07-08 ENCOUNTER — Telehealth: Payer: Self-pay

## 2022-07-08 NOTE — Telephone Encounter (Signed)
Received new start paperwork for patients Esbriet, per starting PA through [facility] [employer] informed that an Authorization was already on the patients file.   Test claim preformed shows a $10.00 for 270 tab of generic Esbriet.

## 2022-07-08 NOTE — Telephone Encounter (Signed)
Copay is currently $10 through pt's insurance plan, however he is still in the "initial benefit" period through his Part D. Price will most likely increase once he hits coverage gap, at which point we can submit for PAP if needed.   Pt can fill through [facility] at this time.

## 2022-07-13 DIAGNOSIS — K449 Diaphragmatic hernia without obstruction or gangrene: Secondary | ICD-10-CM | POA: Diagnosis not present

## 2022-07-13 DIAGNOSIS — I700 Atherosclerosis of aorta: Secondary | ICD-10-CM | POA: Diagnosis not present

## 2022-07-13 DIAGNOSIS — R918 Other nonspecific abnormal finding of lung field: Secondary | ICD-10-CM | POA: Diagnosis not present

## 2022-07-13 DIAGNOSIS — J849 Interstitial pulmonary disease, unspecified: Secondary | ICD-10-CM | POA: Diagnosis not present

## 2022-07-13 DIAGNOSIS — J84112 Idiopathic pulmonary fibrosis: Secondary | ICD-10-CM | POA: Diagnosis not present

## 2022-07-17 ENCOUNTER — Other Ambulatory Visit: Payer: Self-pay | Admitting: Interventional Cardiology

## 2022-07-18 ENCOUNTER — Telehealth: Payer: Self-pay | Admitting: Pharmacist

## 2022-07-18 ENCOUNTER — Other Ambulatory Visit (HOSPITAL_COMMUNITY): Payer: Self-pay

## 2022-07-18 DIAGNOSIS — J84112 Idiopathic pulmonary fibrosis: Secondary | ICD-10-CM

## 2022-07-18 DIAGNOSIS — Z5181 Encounter for therapeutic drug level monitoring: Secondary | ICD-10-CM

## 2022-07-18 DIAGNOSIS — J849 Interstitial pulmonary disease, unspecified: Secondary | ICD-10-CM

## 2022-07-18 MED ORDER — PIRFENIDONE 267 MG PO TABS
ORAL_TABLET | ORAL | 0 refills | Status: DC
  Filled 2022-07-18: qty 207, fill #0
  Filled 2022-07-21: qty 207, 30d supply, fill #0

## 2022-07-18 MED ORDER — PIRFENIDONE 267 MG PO TABS
801.0000 mg | ORAL_TABLET | Freq: Three times a day (TID) | ORAL | 4 refills | Status: DC
  Filled 2022-07-18 – 2022-08-22 (×2): qty 270, 30d supply, fill #0
  Filled 2022-09-13: qty 270, 30d supply, fill #1
  Filled 2022-10-11 – 2022-10-24 (×2): qty 270, 30d supply, fill #2
  Filled 2022-11-16: qty 270, 30d supply, fill #3
  Filled 2022-12-15: qty 270, 30d supply, fill #4

## 2022-07-18 NOTE — Telephone Encounter (Signed)
Patient counseled on pirfenidone in separate telephone encounter. Routing to [place] for onboarding needs and to set up first shipment  Knox Saliva, PharmD, MPH, BCPS, CPP Clinical Pharmacist (Rheumatology and Pulmonology)

## 2022-07-18 NOTE — Telephone Encounter (Signed)
Subjective:  Patient called today by [facility] pharmacy team for pirfenidone new start. Patient was last seen by Dr. Vaughan Browner on 07/06/22.  Pertinent past medical history includes: HTN, CAD s/p CABG x 5, PAD, OSA, GERD, Barrett's esophagus, history of colon diverticulosis, Parkinson's disease.  He says Dr. Lyndel Safe at [facility] . Pt has history of Barrett's esophagus which is well controlled - pt continues to take pantoprazole 58m daily as recommended by Dr. GLyndel Safe History of elevated LFTs: No History of diarrhea, nausea, vomiting: Yes - while on Ofev  Objective: Allergies  Allergen Reactions   Aspirin Hives and Shortness Of Breath    "Shortness of Breath" can't breathe   Quinine Derivatives Hives and Other (See Comments)    "looks like he has the measles"    Outpatient Encounter Medications as of 07/18/2022  Medication Sig   acetaminophen (TYLENOL) 500 MG tablet Take 1 tablet (500 mg total) by mouth every 6 (six) hours as needed for mild pain or headache.   ALPRAZolam (XANAX) 0.25 MG tablet Take 0.25 mg by mouth 2 (two) times daily as needed for anxiety.    atorvastatin (LIPITOR) 80 MG tablet Take 1 tablet (80 mg total) by mouth every evening.   carbidopa-levodopa (SINEMET IR) 25-100 MG tablet TAKE 1 TABLET BY MOUTH 3 (THREE) TIMES DAILY.   Cholecalciferol (VITAMIN D3) 50 MCG (2000 UT) capsule Take 2,000 Units by mouth daily.   clopidogrel (PLAVIX) 75 MG tablet TAKE 1 TABLET BY MOUTH EVERY DAY (Patient taking differently: Take 75 mg by mouth daily.)   hydrochlorothiazide (MICROZIDE) 12.5 MG capsule TAKE 1 CAPSULE BY MOUTH EVERY DAY   lisinopril (ZESTRIL) 40 MG tablet Take 0.5 tablets (20 mg total) by mouth at bedtime.   metoprolol tartrate (LOPRESSOR) 25 MG tablet TAKE 0.5 TABLETS BY MOUTH 2 TIMES DAILY.   mirtazapine (REMERON) 7.5 MG tablet Take 7.5 mg by mouth at bedtime.   Multiple Vitamins-Minerals (ONE-A-DAY MENS 50+) TABS Take 1 tablet by mouth daily with breakfast.    pantoprazole (PROTONIX) 40 MG tablet Take 1 tablet (40 mg total) by mouth daily. Please call [phone] schedule an office visit for more refills   No facility-administered encounter medications on file as of 07/18/2022.     Immunization History  Administered Date(s) Administered   Influenza-Unspecified 06/10/2021   Moderna Sars-Covid-2 Vaccination 11/05/2019      PFT's TLC  Date Value Ref Range Status  07/06/2022 5.49 L Final      CMP     Component Value Date/Time   NA 140 07/06/2022 1639   NA 138 09/02/2020 0844   K 4.8 07/06/2022 1639   CL 104 07/06/2022 1639   CO2 29 07/06/2022 1639   GLUCOSE 105 (H) 07/06/2022 1639   BUN 31 (H) 07/06/2022 1639   BUN 20 09/02/2020 0844   CREATININE 1.28 07/06/2022 1639   CALCIUM 9.7 07/06/2022 1639   PROT 7.6 07/06/2022 1639   PROT 6.6 03/20/2017 1331   ALBUMIN 4.3 07/06/2022 1639   ALBUMIN 4.3 03/20/2017 1331   AST 20 07/06/2022 1639   ALT 13 07/06/2022 1639   ALKPHOS 89 07/06/2022 1639   BILITOT 0.7 07/06/2022 1639   BILITOT 1.1 03/20/2017 1331   GFRNONAA 59 (L) 03/26/2022 0322   GFRAA 81 09/02/2020 0844      CBC    Component Value Date/Time   WBC 6.4 03/26/2022 0322   RBC 3.30 (L) 03/26/2022 0322   HGB 11.7 (L) 03/26/2022 0322   HGB 12.4 (L)  09/02/2020 0844   HCT 32.4 (L) 03/26/2022 0322   HCT 35.2 (L) 09/02/2020 0844   PLT 140 (L) 03/26/2022 0322   PLT 157 09/02/2020 0844   MCV 98.2 03/26/2022 0322   MCV 101 (H) 09/02/2020 0844   MCH 35.5 (H) 03/26/2022 0322   MCHC 36.1 (H) 03/26/2022 0322   RDW 14.5 03/26/2022 0322   RDW 12.8 09/02/2020 0844   LYMPHSABS 1.9 03/24/2022 1044   MONOABS 0.9 03/24/2022 1044   EOSABS 0.2 03/24/2022 1044   BASOSABS 0.0 03/24/2022 1044      LFT's    Latest Ref Rng & Units 07/06/2022    4:39 PM 03/24/2022   10:44 AM 12/06/2021    3:28 PM  Hepatic Function  Total Protein 6.0 - 8.3 g/dL 7.6  6.9  7.1   Albumin 3.5 - 5.2 g/dL 4.3  3.7  4.0   AST 0 - 37 U/L '20  19  28   ' ALT  0 - 53 U/L '13  7  11   ' Alk Phosphatase 39 - 117 U/L 89  79  95   Total Bilirubin 0.2 - 1.2 mg/dL 0.7  1.1  0.6   Bilirubin, Direct 0.0 - 0.3 mg/dL   0.1       Assessment and Plan  Esbriet Medication Management Thoroughly counseled patient on the efficacy, mechanism of action, dosing, administration, adverse effects, and monitoring parameters of Esbriet.  Patient verbalized understanding.   Goals of Therapy: Will not stop or reverse the progression of ILD. It will slow the progression of ILD.   Dosing: Starting dose will be Esbriet 267 mg 1 tablet three times daily for 7 days, then 2 tablets three times daily for 7 days, then 3 tablets three times daily.  Maintenance dose will be 801 mg 1 tablet three times daily if tolerated.  Stressed the importance of taking with meals and space at least 5-6 hours apart to minimize stomach upset.   Adverse Effects: Nausea, vomiting, diarrhea, weight loss Abdominal pain GERD - does have history which he states is well-controlled at this time. Continue on pantoprazole 52m daily Sun sensitivity/rash - patient advised to wear sunscreen when exposed to sunlight. Reinforced importance of long sleeves and sunscreen on exposed skin Dizziness Fatigue  Monitoring: Monitor for diarrhea, nausea and vomiting, GI perforation, hepatotoxicity  Monitor LFTs - baseline, monthly for first 6 months, then every 3 months routinely CBC w differential at baseline and every 3 months routinely Future order for CMET placed. Reviewed with patient to call [place] we can place lab orders for testing site close to home.  Access: Approval of Esbriet through: [facility] [phone]  Medication Reconciliation A drug regimen assessment was performed, including review of allergies, interactions, disease-state management, dosing and immunization history. Medications were reviewed with the patient, including name, instructions,  indication, goals of therapy, potential side effects, importance of adherence, and safe use.  No interactions with pirfenidone noted.  This appointment required 15 minutes of patient care (this includes precharting, chart review, review of results, face-to-face care, etc.).  Thank you for involving pharmacy to assist in providing this patient's care.   DKnox Saliva PharmD, MPH, BCPS, CPP Clinical Pharmacist (Rheumatology and Pulmonology)

## 2022-07-20 ENCOUNTER — Other Ambulatory Visit (HOSPITAL_COMMUNITY): Payer: Self-pay

## 2022-07-21 ENCOUNTER — Other Ambulatory Visit (HOSPITAL_COMMUNITY): Payer: Self-pay

## 2022-07-21 NOTE — Telephone Encounter (Signed)
Delivery instructions have been updated in [place], medication will be shipped to patient's home address by 10/7.  Rx has been processed in [facility] and there is a copay of $10.00. Payment information has been collected and forwarded to the pharmacy.

## 2022-07-22 ENCOUNTER — Other Ambulatory Visit (HOSPITAL_COMMUNITY): Payer: Self-pay

## 2022-07-26 ENCOUNTER — Telehealth: Payer: Self-pay | Admitting: Pulmonary Disease

## 2022-07-26 NOTE — Telephone Encounter (Signed)
Patient is calling again regarding the results from the CT scan.  Patient would like a call back by the end of the day if possible.

## 2022-07-26 NOTE — Telephone Encounter (Signed)
Patient called to request the results of his last CT scan.  He would like the results before he starts to take the medication he just received.  CB# [phone]

## 2022-07-27 ENCOUNTER — Encounter: Payer: Self-pay | Admitting: Physician Assistant

## 2022-07-27 ENCOUNTER — Ambulatory Visit: Payer: Medicare PPO | Admitting: Physician Assistant

## 2022-07-27 VITALS — BP 120/66 | HR 71 | Resp 20 | Ht 63.0 in | Wt 171.0 lb

## 2022-07-27 DIAGNOSIS — G20A1 Parkinson's disease without dyskinesia, without mention of fluctuations: Secondary | ICD-10-CM | POA: Diagnosis not present

## 2022-07-27 DIAGNOSIS — F0670 Mild neurocognitive disorder due to known physiological condition without behavioral disturbance: Secondary | ICD-10-CM

## 2022-07-27 MED ORDER — CARBIDOPA-LEVODOPA 25-100 MG PO TABS
1.0000 | ORAL_TABLET | Freq: Three times a day (TID) | ORAL | 1 refills | Status: DC
Start: 2022-07-27 — End: 2023-01-30

## 2022-07-27 NOTE — Progress Notes (Signed)
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 Patient is not on antidementia medications at this time. Last MMSE on 01/2022 was 30/30 . He is stable form the cognitive standpoint and tremors are well managed with the current regimen.      Recommendations:   Follow up in  6  months. Continue Carbidopa-Levodopa 25/100 tid , side effects discussed Antidementia medication not indicated at this time  Recommend OSA follow up, patient not on CPAP Recommend hearing evaluation /aids as this memory difficulty may be exacerbated by [facility] Recommend good control of cardiovascular risk factors.  Continue Plavix daily     Subjective:   This patient is here alone   Previous records as well as any outside records available were reviewed prior to todays visit.  Last seen on 01/25/22. Last MMSE 30/30    Any changes in memory since last visit? No changes , "seems to be getting better, able to remember better  and I spell WORLD backwards daily" able to recall conversations . HE does not elaborate, at times becomes slightly tangential. repeats oneself?  Endorsed Disoriented when walking into a room?  Patient denies   Leaving objects in unusual places?  Patient denies   Ambulates  with difficulty?   Patient denies   Recent falls?  Patient denies   Any head injuries?  Patient denies   History of seizures?   Patient denies   Wandering behavior?  Patient denies   Patient drives?  No issues  Any mood changes since last visit?  Patient denies   Any worsening depression?:  Patient denies   Hallucinations?  Patient denies   Paranoia?  Patient denies   Patient reports that sleeps well without vivid dreams, REM  behavior or sleepwalking   History of sleep apnea?  Patient denies   Any hygiene concerns?  Patient denies   Independent of bathing and dressing?  Endorsed  Does the patient needs help with medications? Patient In charge  Who is in charge of the finances? Patient  is in charge    Any changes in appetite?  Patient denies    Patient have trouble swallowing? Patient denies   Does the patient cook?  No issues Any kitchen accidents such as leaving the stove on? Patient denies   Any headaches?  Patient denies   Double vision? Patient denies   Any focal numbness or tingling?  Patient denies   Chronic back pain Patient denies   Unilateral weakness?  Patient denies   Tremors are stable, controlled with Sinemet, denies dropping objects, increased drooling or trouble wallowing. He denies any voice changes.  Any history of anosmia?  Patient denies   Any incontinence of urine?  Patient denies   Any bowel dysfunction?   Patient denies constipation       Patient lives  with wife        Past Medical History:  Diagnosis Date   Acute ischemic stroke ([place]) 09/15/2015   Ankle fracture, left 2009   rod placed   Ankle fracture, left    rod placed   Anxiety    Aspirin allergy    a. Anaphylactic rxn.   Barrett's esophagus    CAD (  coronary artery disease)    a. Inf STEMI 07/2013: s/p DES x 2 from ostial to [facility] 07/13/13. Residual Cx disease for med rx (PCI if recurrent pain).   Controlled gout    Dyslipidemia    ED (erectile dysfunction)    Edema of both legs    Gastric polyp    GERD (gastroesophageal reflux disease)    Hiatal hernia    History of nuclear stress test    Nuclear stress test 5/19: EF 53, no ischemia, no infarction; Low Risk   Hydrocele of testis    Hyperglycemia    a. A1C 5.8 in 07/2013.   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   OSA (obstructive sleep apnea)    Severe obstructive sleep apnea  with an AHI of 40.4/h on auto CPAP   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   LIMA to LAD, SVG to Diag, Sequential SVG to OM2-OM3, SVG to RCA, EVH via right thigh and leg   Unstable angina [facility])      Past Surgical History:  Procedure Laterality Date   BACK SURGERY     L1 kyphoplasty by Dr Trenton Gammon 03/2018     thoractic 02-2018   cataract surgery      bilateral    COLONOSCOPY     CORONARY ANGIOPLASTY     stents    CORONARY ARTERY BYPASS GRAFT N/A 12/12/2018   Procedure: CORONARY ARTERY BYPASS GRAFTING (CABG);  Surgeon: Rexene Alberts, MD;  Location: [place];  Service: Open Heart Surgery;  Laterality: N/A;   ESOPHAGOGASTRODUODENOSCOPY  04/04/2016   Short-segment Barrett's esophagus (biopsied) Gastric polyps (status post polypectomy x1) Hiatal hernia   HYDROCELE EXCISION Left 07/17/2014   Procedure: LEFT HYDROCELECTOMY;  Surgeon: Malka So, MD;  Location: [facility];  Service: Urology;  Laterality: Left;   left ankle surgery  1999   LEFT HEART CATH AND CORONARY ANGIOGRAPHY N/A 12/11/2018   Procedure: LEFT HEART CATH AND CORONARY ANGIOGRAPHY;  Surgeon: Belva Crome, MD;  Location: [place] CV LAB;  Service: Cardiovascular;  Laterality: N/A;   LEFT HEART CATHETERIZATION WITH CORONARY ANGIOGRAM N/A 07/13/2013   Procedure: LEFT HEART CATHETERIZATION WITH CORONARY ANGIOGRAM;  Surgeon: Sinclair Grooms, MD;  Location: [facility] CATH LAB;  Service: Cardiovascular;  Laterality: N/A;   PERCUTANEOUS CORONARY STENT INTERVENTION (PCI-S)  07/13/2013   Procedure: PERCUTANEOUS CORONARY STENT INTERVENTION (PCI-S);  Surgeon: Sinclair Grooms, MD;  Location: [facility] CATH LAB;  Service: Cardiovascular;;   TEE WITHOUT CARDIOVERSION N/A 12/12/2018   Procedure: TRANSESOPHAGEAL ECHOCARDIOGRAM (TEE);  Surgeon: Rexene Alberts, MD;  Location: [place];  Service: Open Heart Surgery;  Laterality: N/A;   TONSILLECTOMY       PREVIOUS MEDICATIONS:   CURRENT MEDICATIONS:  Outpatient Encounter Medications as of 07/27/2022  Medication Sig    acetaminophen (TYLENOL) 500 MG tablet Take 1 tablet (500 mg total) by mouth every 6 (six) hours as needed for mild pain or headache.   ALPRAZolam (XANAX) 0.25 MG tablet Take 0.25 mg by mouth 2 (two) times daily as needed for anxiety.    atorvastatin (LIPITOR) 80 MG tablet Take 1 tablet (80 mg total) by mouth every evening.   carbidopa-levodopa (SINEMET IR) 25-100 MG tablet Take 1 tablet by mouth 3 (three) times daily.   Cholecalciferol (VITAMIN D3) 50 MCG (2000 UT) capsule Take 2,000 Units by mouth daily.   clopidogrel (PLAVIX) 75 MG tablet TAKE 1 TABLET  BY MOUTH EVERY DAY (Patient taking differently: Take 75 mg by mouth daily.)   hydrochlorothiazide (MICROZIDE) 12.5 MG capsule TAKE 1 CAPSULE BY MOUTH EVERY DAY   lisinopril (ZESTRIL) 40 MG tablet Take 0.5 tablets (20 mg total) by mouth at bedtime.   metoprolol tartrate (LOPRESSOR) 25 MG tablet TAKE 0.5 TABLETS BY MOUTH 2 TIMES DAILY.   mirtazapine (REMERON) 7.5 MG tablet Take 7.5 mg by mouth at bedtime.   Multiple Vitamins-Minerals (ONE-A-DAY MENS 50+) TABS Take 1 tablet by mouth daily with breakfast.   pantoprazole (PROTONIX) 40 MG tablet Take 1 tablet (40 mg total) by mouth daily. Please call [phone] to schedule an office visit for more refills   Pirfenidone (ESBRIET) 267 MG TABS Month 1: Take 1 tab three times daily for 7 days, then 2 tabs three times daily for 7 days, then 3 tabs three times daily thereafter.   Pirfenidone (ESBRIET) 267 MG TABS Take 3 tablets (801 mg total) by mouth 3 (three) times daily with meals. Month 2 and onwards   [DISCONTINUED] carbidopa-levodopa (SINEMET IR) 25-100 MG tablet TAKE 1 TABLET BY MOUTH 3 (THREE) TIMES DAILY.   No facility-administered encounter medications on file as of 07/27/2022.     Objective:     PHYSICAL EXAMINATION:    VITALS:   Vitals:   07/27/22 1427  BP: 120/66  Pulse: 71  Resp: 20  SpO2: 96%  Weight: 171 lb (77.6 kg)  Height: '5\' 3"'$  (1.6 m)    GEN:  The patient appears stated  age and is in NAD. HEENT:  Normocephalic, atraumatic.   Neurological examination:  General: NAD, well-groomed, appears stated age. Hypomimia Orientation: The patient is alert. Oriented to person, place and date Cranial nerves: There is good facial symmetry.The speech is fluent and clear but does take time to answer questions. No aphasia or dysarthria. Fund of knowledge is appropriate. Recent memory impaired and remote memory is normal.  Attention and concentration are normal.  Able to name objects and repeat phrases.  Hearing is very reduced to conversational tone.    Sensation: Sensation is intact to light touch throughout Motor: Strength is at least antigravity x4. Tremors: improved from prior exam, R>L intention tremor DTR's 2/4 in UE/LE      03/18/2021    3:00 PM  Montreal Cognitive Assessment   Visuospatial/ Executive (0/5) 3  Naming (0/3) 3  Attention: Read list of digits (0/2) 2  Attention: Read list of letters (0/1) 1  Attention: Serial 7 subtraction starting at 100 (0/3) 3  Language: Repeat phrase (0/2) 1  Language : Fluency (0/1) 0  Abstraction (0/2) 0  Delayed Recall (0/5) 3  Orientation (0/6) 5  Total 21  Adjusted Score (based on education) 22       01/25/2022    8:00 PM  MMSE - Mini Mental State Exam  Orientation to time 5  Orientation to Place 5  Registration 3  Attention/ Calculation 5  Recall 3  Language- name 2 objects 2  Language- repeat 1  Language- follow 3 step command 3  Language- read & follow direction 1  Write a sentence 1  Copy design 1  Total score 30       Movement examination: Tone: There is R>L increased tone in the UE/LE. + B L>R cogwheeling Other Abnormal movements:  No myoclonus.  No asterixis.   Coordination:  There is no decremation with RAM's. Normal finger to nose  Gait and Station: The patient has no difficulty arising out of a  deep-seated chair without the use of the hands. The patient's stride length is good.  Gait is cautious  and wider based, R arm decreased swing  Thank you for allowing [place] the opportunity to participate in the care of this nice patient. Please do not hesitate to contact us for any questions or concerns.   Total time spent on today's visit was 24 minutes dedicated to this patient today, preparing to see patient, examining the patient, ordering tests and/or medications and counseling the patient, documenting clinical information in the EHR or other health record, independently interpreting results and communicating results to the patient/family, discussing treatment and goals, answering patient's questions and coordinating care.  Cc:  Nicoletta Dress, MD  Sharene Butters 07/27/2022 6:08 PM

## 2022-07-27 NOTE — Patient Instructions (Signed)
It was a pleasure to see you today at our office. Everything is stable no new findings   Recommendations:  Follow up in  6 months Continue Carbidopa Levodopa as prescribed, no changes  Continue with your other medicines  Use your hearing aids!!!    RECOMMENDATIONS FOR ALL PATIENTS WITH MEMORY PROBLEMS: 1. Continue to exercise (Recommend 30 minutes of walking everyday, or 3 hours every week) 2. Increase social interactions - continue going to [place] and enjoy social gatherings with friends and family 3. Eat healthy, avoid fried foods and eat more fruits and vegetables 4. Maintain adequate blood pressure, blood sugar, and blood cholesterol level. Reducing the risk of stroke and cardiovascular disease also helps promoting better memory. 5. Avoid stressful situations. Live a simple life and avoid aggravations. Organize your time and prepare for the next day in anticipation. 6. Sleep well, avoid any interruptions of sleep and avoid any distractions in the bedroom that may interfere with adequate sleep quality 7. Avoid sugar, avoid sweets as there is a strong link between excessive sugar intake, diabetes, and cognitive impairment We discussed the Mediterranean diet, which has been shown to help patients reduce the risk of progressive memory disorders and reduces cardiovascular risk. This includes eating fish, eat fruits and green leafy vegetables, nuts like almonds and hazelnuts, walnuts, and also use olive oil. Avoid fast foods and fried foods as much as possible. Avoid sweets and sugar as sugar use has been linked to worsening of memory function.  There is always a concern of gradual progression of memory problems. If this is the case, then we may need to adjust level of care according to patient needs. Support, both to the patient and caregiver, should then be put into place.    FALL PRECAUTIONS: Be cautious when walking. Scan the area for obstacles that may increase the risk of trips and falls.  When getting up in the mornings, sit up at the edge of the bed for a few minutes before getting out of bed. Consider elevating the bed at the head end to avoid drop of blood pressure when getting up. Walk always in a well-lit room (use night lights in the walls). Avoid area rugs or power cords from appliances in the middle of the walkways. Use a walker or a cane if necessary and consider physical therapy for balance exercise. Get your eyesight checked regularly.  FINANCIAL OVERSIGHT: Supervision, especially oversight when making financial decisions or transactions is also recommended.  HOME SAFETY: Consider the safety of the kitchen when operating appliances like stoves, microwave oven, and blender. Consider having supervision and share cooking responsibilities until no longer able to participate in those. Accidents with firearms and other hazards in the house should be identified and addressed as well.   ABILITY TO BE LEFT ALONE: If patient is unable to contact 911 operator, consider using LifeLine, or when the need is there, arrange for someone to stay with patients. Smoking is a fire hazard, consider supervision or cessation. Risk of wandering should be assessed by caregiver and if detected at any point, supervision and safe proof recommendations should be instituted.  MEDICATION SUPERVISION: Inability to self-administer medication needs to be constantly addressed. Implement a mechanism to ensure safe administration of the medications.   DRIVING: Regarding driving, in patients with progressive memory problems, driving will be impaired. We advise to have someone else do the driving if trouble finding directions or if minor accidents are reported. Independent driving assessment is available to determine safety of driving.  If you are interested in the driving assessment, you can contact the following:  [employer] in [place]  [place]  [phone]  [place]  [facility] [phone] or [phone]

## 2022-07-29 ENCOUNTER — Encounter: Payer: Self-pay | Admitting: Pulmonary Disease

## 2022-07-29 NOTE — Progress Notes (Signed)
Called and spoke with patient.  CT results from Dr. Vaughan Browner given.  Understanding stated.  Patient denies any cough at this time.  Advised patient to call with any issues.  Nothing further at this time.

## 2022-07-29 NOTE — Progress Notes (Signed)
Pulmonary note  High-resolution CT 07/13/2022 Reviewed images from [facility] Examination is significantly limited by motion artifact.  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  Mild groundglass opacities in the superior segment of the left lower lobe Coronary artery disease.  Please let patient know that his CT looks stable.  There may be some slight inflammation in the left lung.  If he has cough and mucus production then please call in a Z-Pak.  Marshell Garfinkel MD [facility] 07/29/2022, 2:42 PM

## 2022-07-30 ENCOUNTER — Other Ambulatory Visit: Payer: Self-pay | Admitting: Interventional Cardiology

## 2022-08-02 ENCOUNTER — Telehealth: Payer: Self-pay | Admitting: Pulmonary Disease

## 2022-08-02 NOTE — Telephone Encounter (Signed)
Patient receives pirfenidone from [facility]. Spoke with patient regarding application.  He states it says [employer]. Reviewed that he has switched from Ofev to pirfenidone and can discard that application since he is no longer on Ofev. He verbalized understanding. Nothing further needed  Knox Saliva, PharmD, MPH, BCPS, CPP Clinical Pharmacist (Rheumatology and Pulmonology)

## 2022-08-09 ENCOUNTER — Other Ambulatory Visit: Payer: Self-pay | Admitting: Interventional Cardiology

## 2022-08-10 NOTE — Telephone Encounter (Signed)
See note from 10/20.

## 2022-08-16 ENCOUNTER — Other Ambulatory Visit (HOSPITAL_COMMUNITY): Payer: Self-pay

## 2022-08-17 ENCOUNTER — Telehealth: Payer: Self-pay

## 2022-08-17 NOTE — Telephone Encounter (Signed)
Received faxed PA renewal form from  [facility]  for PIRFENIDONE. Completed PA questions and submitted request via fax along with supporting chart notes. Will await response.  Phone # [phone] Fax # [phone]

## 2022-08-18 ENCOUNTER — Other Ambulatory Visit (HOSPITAL_COMMUNITY): Payer: Self-pay

## 2022-08-18 NOTE — Telephone Encounter (Signed)
Received fax from [facility] stating that pirfenidone authorization is already approved through 10/10/2023. However you asked for a tiering exception. [employer] follows Medicare rules and Medicare allows for tiering exceptions only where there is a lower cost drug available for treating ht same condition There are no lower cost drugs for your treatment. You do not qualify for tiering exception per Medicare rules  Knox Saliva, PharmD, MPH, BCPS, CPP Clinical Pharmacist (Rheumatology and Pulmonology)

## 2022-08-19 ENCOUNTER — Other Ambulatory Visit: Payer: Self-pay | Admitting: Interventional Cardiology

## 2022-08-19 ENCOUNTER — Other Ambulatory Visit: Payer: Self-pay

## 2022-08-19 MED ORDER — METOPROLOL TARTRATE 25 MG PO TABS: ORAL_TABLET | ORAL | 0 refills | Status: DC

## 2022-08-22 ENCOUNTER — Other Ambulatory Visit (HOSPITAL_COMMUNITY): Payer: Self-pay

## 2022-09-03 ENCOUNTER — Emergency Department (EMERGENCY_DEPARTMENT_HOSPITAL): Payer: Medicare PPO

## 2022-09-03 ENCOUNTER — Encounter (HOSPITAL_COMMUNITY): Payer: Self-pay | Admitting: Emergency Medicine

## 2022-09-03 ENCOUNTER — Emergency Department (HOSPITAL_COMMUNITY)
Admission: EM | Admit: 2022-09-03 | Discharge: 2022-09-04 | Disposition: A | Discharge status: Home / Self Care | Payer: Medicare PPO | Attending: Student | Admitting: Student

## 2022-09-03 DIAGNOSIS — I714 Abdominal aortic aneurysm, without rupture, unspecified: Secondary | ICD-10-CM | POA: Diagnosis not present

## 2022-09-03 DIAGNOSIS — M79672 Pain in left foot: Secondary | ICD-10-CM | POA: Diagnosis not present

## 2022-09-03 DIAGNOSIS — K5730 Diverticulosis of large intestine without perforation or abscess without bleeding: Secondary | ICD-10-CM | POA: Diagnosis not present

## 2022-09-03 DIAGNOSIS — M79605 Pain in left leg: Secondary | ICD-10-CM | POA: Insufficient documentation

## 2022-09-03 DIAGNOSIS — I10 Essential (primary) hypertension: Secondary | ICD-10-CM | POA: Diagnosis not present

## 2022-09-03 DIAGNOSIS — Z7902 Long term (current) use of antithrombotics/antiplatelets: Secondary | ICD-10-CM | POA: Diagnosis not present

## 2022-09-03 DIAGNOSIS — I2510 Atherosclerotic heart disease of native coronary artery without angina pectoris: Secondary | ICD-10-CM | POA: Insufficient documentation

## 2022-09-03 DIAGNOSIS — M25572 Pain in left ankle and joints of left foot: Secondary | ICD-10-CM | POA: Diagnosis not present

## 2022-09-03 DIAGNOSIS — I7143 Infrarenal abdominal aortic aneurysm, without rupture: Secondary | ICD-10-CM

## 2022-09-03 DIAGNOSIS — M7989 Other specified soft tissue disorders: Secondary | ICD-10-CM | POA: Diagnosis not present

## 2022-09-03 DIAGNOSIS — Z951 Presence of aortocoronary bypass graft: Secondary | ICD-10-CM | POA: Insufficient documentation

## 2022-09-03 DIAGNOSIS — Z79899 Other long term (current) drug therapy: Secondary | ICD-10-CM | POA: Insufficient documentation

## 2022-09-03 DIAGNOSIS — R29898 Other symptoms and signs involving the musculoskeletal system: Secondary | ICD-10-CM | POA: Diagnosis not present

## 2022-09-03 DIAGNOSIS — R0989 Other specified symptoms and signs involving the circulatory and respiratory systems: Secondary | ICD-10-CM | POA: Diagnosis not present

## 2022-09-03 DIAGNOSIS — I739 Peripheral vascular disease, unspecified: Secondary | ICD-10-CM

## 2022-09-03 DIAGNOSIS — K8020 Calculus of gallbladder without cholecystitis without obstruction: Secondary | ICD-10-CM | POA: Diagnosis not present

## 2022-09-03 DIAGNOSIS — Z8673 Personal history of transient ischemic attack (TIA), and cerebral infarction without residual deficits: Secondary | ICD-10-CM | POA: Diagnosis not present

## 2022-09-03 LAB — COMPREHENSIVE METABOLIC PANEL
ALT: 17 U/L (ref 0–44)
AST: 26 U/L (ref 15–41)
Albumin: 3.9 g/dL (ref 3.5–5.0)
Alkaline Phosphatase: 69 U/L (ref 38–126)
Anion gap: 14 (ref 5–15)
BUN: 31 mg/dL — ABNORMAL HIGH (ref 8–23)
CO2: 23 mmol/L (ref 22–32)
Calcium: 9.1 mg/dL (ref 8.9–10.3)
Chloride: 102 mmol/L (ref 98–111)
Creatinine, Ser: 1.84 mg/dL — ABNORMAL HIGH (ref 0.61–1.24)
GFR, Estimated: 36 mL/min — ABNORMAL LOW (ref >60)
Glucose, Bld: 97 mg/dL (ref 70–99)
Potassium: 4.1 mmol/L (ref 3.5–5.1)
Sodium: 139 mmol/L (ref 135–145)
Total Bilirubin: 0.6 mg/dL (ref 0.3–1.2)
Total Protein: 6.7 g/dL (ref 6.5–8.1)

## 2022-09-03 LAB — CBC WITH DIFFERENTIAL/PLATELET
Abs Immature Granulocytes: 0.02 10*3/uL (ref 0.00–0.07)
Basophils Absolute: 0 10*3/uL (ref 0.0–0.1)
Basophils Relative: 0 %
Eosinophils Absolute: 0.2 10*3/uL (ref 0.0–0.5)
Eosinophils Relative: 3 %
HCT: 36.2 % — ABNORMAL LOW (ref 39.0–52.0)
Hemoglobin: 12.3 g/dL — ABNORMAL LOW (ref 13.0–17.0)
Immature Granulocytes: 0 %
Lymphocytes Relative: 32 %
Lymphs Abs: 2.2 10*3/uL (ref 0.7–4.0)
MCH: 34 pg (ref 26.0–34.0)
MCHC: 34 g/dL (ref 30.0–36.0)
MCV: 100 fL (ref 80.0–100.0)
Monocytes Absolute: 0.6 10*3/uL (ref 0.1–1.0)
Monocytes Relative: 9 %
Neutro Abs: 3.8 10*3/uL (ref 1.7–7.7)
Neutrophils Relative %: 56 %
Platelets: 169 10*3/uL (ref 150–400)
RBC: 3.62 MIL/uL — ABNORMAL LOW (ref 4.22–5.81)
RDW: 13.3 % (ref 11.5–15.5)
WBC: 6.8 10*3/uL (ref 4.0–10.5)
nRBC: 0 % (ref 0.0–0.2)

## 2022-09-03 NOTE — ED Triage Notes (Addendum)
Patient referred by MD in [place] as the patient has been having leg pain to the L leg for a week stating the pain was so bad he hasn't been able to walk. The the physicians office was not able to find pulses in the R or L foot. Denies SHOB,chest pain at this time. Palpable pulses felt in triage.

## 2022-09-03 NOTE — ED Provider Notes (Signed)
11:04 PM Assumed care from Dr. Matilde Sprang, please see their note for full history, physical and decision making until this point. In brief this is a 81 y.o. year old male who presented to the ED tonight with Leg Pain and possible blood clot     Claudication type symptoms, faint pulses but dopplerable. Pending CTA to eval for acute or critical blockages. Likely d/c if ok.   Discharge instructions, including strict return precautions for new or worsening symptoms, given. Patient and/or family verbalized understanding and agreement with the plan as described.   Labs, studies and imaging reviewed by myself and considered in medical decision making if ordered. Imaging interpreted by radiology.  Labs Reviewed  COMPREHENSIVE METABOLIC PANEL  CBC WITH DIFFERENTIAL/PLATELET    VAS [place] LOWER EXTREMITY VENOUS (DVT) (7a-7p)  Final Result    CT Angio Aortobifemoral W and/or Wo Contrast    (Results Pending)    No follow-ups on file.

## 2022-09-03 NOTE — ED Provider Notes (Signed)
[place] [facility] EMERGENCY DEPARTMENT Provider Note  CSN: [id number] Arrival date & time: 09/03/22 1429  Chief Complaint(s) Leg Pain and possible blood clot  HPI INMAN FETTIG is a 81 y.o. male with PMH CVA, Barrett's esophagus, CAD status post MI and DES placement and ultimately CABG, OSA who presents to the emergency department for evaluation of left lower extremity pain and concern for ischemia.  Patient was seen in [place] where providers at urgent care were unable to obtain a palpable pulse and sent the patient to the emergency department for further evaluation.  They recommended EMS transfer at that time but patient refused and drove himself to the emergency department.  Patient states that he has been having lower extremity symptoms for approximately 1 week and endorses severe leg pain, worse at night and he wakes in the morning with bright red swollen feet.  Denies chest pain, shortness of breath, abdominal pain, nausea, vomiting or other systemic symptoms.   Past Medical History Past Medical History:  Diagnosis Date   Acute ischemic stroke ([place]) 09/15/2015   Ankle fracture, left 2009   rod placed   Ankle fracture, left    rod placed   Anxiety    Aspirin allergy    a. Anaphylactic rxn.   Barrett's esophagus    CAD (coronary artery disease)    a. Inf STEMI 07/2013: s/p DES x 2 from ostial to [facility] 07/13/13. Residual Cx disease for med rx (PCI if recurrent pain).   Controlled gout    Dyslipidemia    ED (erectile dysfunction)    Edema of both legs    Gastric polyp    GERD (gastroesophageal reflux disease)    Hiatal hernia    History of nuclear stress test    Nuclear stress test 5/19: EF 53, no ischemia, no infarction; Low Risk   Hydrocele of testis    Hyperglycemia    a. A1C 5.8 in 07/2013.   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   Diarrhea 03/24/2022   Generalized weakness 03/24/2022   Fatigue 03/24/2022   IPF (idiopathic pulmonary fibrosis) ([place]) 10/20/2021   Parkinson's disease 07/27/2021   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   Excessive daytime sleepiness 06/04/2021   Tremors of nervous system 03/18/2021   Memory loss 03/18/2021   OSA (obstructive sleep apnea) 11/30/2020   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 Authorizing Provider  acetaminophen (TYLENOL) 500 MG tablet Take 1 tablet (500 mg total) by  mouth every 6 (six) hours as needed for mild pain or headache. 01/12/18   Norm Parcel, PA-C  ALPRAZolam Duanne Moron) 0.25 MG tablet Take 0.25 mg by mouth 2 (two) times daily as needed for anxiety.  06/29/13   [provider]  atorvastatin (LIPITOR) 80 MG tablet Take 1 tablet (80 mg total) by mouth every evening. 02/08/19   Burtis Junes, NP  carbidopa-levodopa (SINEMET IR) 25-100 MG tablet Take 1 tablet by mouth 3 (three) times daily. 07/27/22   Rondel Jumbo, PA-C  Cholecalciferol (VITAMIN D3) 50 MCG (2000 UT) capsule Take  2,000 Units by mouth daily.    [provider]  clopidogrel (PLAVIX) 75 MG tablet TAKE 1 TABLET BY MOUTH EVERY DAY Patient taking differently: Take 75 mg by mouth daily. 06/22/20   Belva Crome, MD  hydrochlorothiazide (MICROZIDE) 12.5 MG capsule TAKE 1 CAPSULE BY MOUTH EVERY DAY 04/11/22   Belva Crome, MD  lisinopril (ZESTRIL) 40 MG tablet Take 0.5 tablets (20 mg total) by mouth at bedtime. 03/28/22   Pokhrel, Corrie Mckusick, MD  metoprolol tartrate (LOPRESSOR) 25 MG tablet TAKE 1/2 TABLET TWICE A DAY BY MOUTH  - Pt must make appt by April, 2024 08/19/22   Belva Crome, MD  mirtazapine (REMERON) 7.5 MG tablet Take 7.5 mg by mouth at bedtime.    [provider]  Multiple Vitamins-Minerals (ONE-A-DAY MENS 50+) TABS Take 1 tablet by mouth daily with breakfast.    [provider]  pantoprazole (PROTONIX) 40 MG tablet Take 1 tablet (40 mg total) by mouth daily. Please call [phone] to schedule an office visit for more refills 04/13/22   Jackquline Denmark, MD  Pirfenidone (ESBRIET) 267 MG TABS Month 1: Take 1 tab three times daily for 7 days, then 2 tabs three times daily for 7 days, then 3 tabs three times daily thereafter. 07/18/22   Mannam, Hart Robinsons, MD  Pirfenidone (ESBRIET) 267 MG TABS Take 3 tablets (801 mg total) by mouth 3 (three) times daily with meals. Month 2 and onwards 07/18/22   Marshell Garfinkel, MD                                                                                                                                    Past Surgical History Past Surgical History:  Procedure Laterality Date   BACK SURGERY     L1 kyphoplasty by Dr Trenton Gammon 03/2018     thoractic [phone]   cataract surgery      bilateral    COLONOSCOPY     CORONARY ANGIOPLASTY     stents    CORONARY ARTERY BYPASS GRAFT N/A 12/12/2018   Procedure: CORONARY ARTERY BYPASS GRAFTING (CABG);  Surgeon: Rexene Alberts, MD;  Location: [facility];  Service: Open Heart Surgery;  Laterality: N/A;    ESOPHAGOGASTRODUODENOSCOPY  04/04/2016   Short-segment Barrett's esophagus (biopsied) Gastric polyps (status post polypectomy x1) Hiatal hernia   HYDROCELE  EXCISION Left 07/17/2014   Procedure: LEFT HYDROCELECTOMY;  Surgeon: Malka So, MD;  Location: [facility];  Service: Urology;  Laterality: Left;   left ankle surgery  1999   LEFT HEART CATH AND CORONARY ANGIOGRAPHY N/A 12/11/2018   Procedure: LEFT HEART CATH AND CORONARY ANGIOGRAPHY;  Surgeon: Belva Crome, MD;  Location: [place] CV LAB;  Service: Cardiovascular;  Laterality: N/A;   LEFT HEART CATHETERIZATION WITH CORONARY ANGIOGRAM N/A 07/13/2013   Procedure: LEFT HEART CATHETERIZATION WITH CORONARY ANGIOGRAM;  Surgeon: Sinclair Grooms, MD;  Location: [facility] CATH LAB;  Service: Cardiovascular;  Laterality: N/A;   PERCUTANEOUS CORONARY STENT INTERVENTION (PCI-S)  07/13/2013   Procedure: PERCUTANEOUS CORONARY STENT INTERVENTION (PCI-S);  Surgeon: Sinclair Grooms, MD;  Location: [facility] CATH LAB;  Service: Cardiovascular;;   TEE WITHOUT CARDIOVERSION N/A 12/12/2018   Procedure: TRANSESOPHAGEAL ECHOCARDIOGRAM (TEE);  Surgeon: Rexene Alberts, MD;  Location: [facility];  Service: Open Heart Surgery;  Laterality: N/A;   TONSILLECTOMY     Family History Family History  Problem Relation Age of Onset   Hypertension Mother    Ovarian cancer Mother    Heart failure Father    Stroke Neg Hx     Social History Social History   Tobacco Use   Smoking status: Never   Smokeless tobacco: Never  Vaping Use   Vaping Use: Never used  Substance Use Topics   Alcohol use: Yes    Comment: occasional beer    Drug use: No   Allergies Aspirin and Quinine derivatives  Review of Systems Review of Systems  Musculoskeletal:  Positive for arthralgias and myalgias.    Physical Exam Vital Signs  I have reviewed the triage vital signs BP 130/75 (BP Location: Right Arm)   Pulse 68   Temp 98.3 F (36.8 C) (Oral)   Resp 17   SpO2 100%   Physical Exam Vitals  and nursing note reviewed.  Constitutional:      General: He is not in acute distress.    Appearance: He is well-developed.  HENT:     Head: Normocephalic and atraumatic.  Eyes:     Conjunctiva/sclera: Conjunctivae normal.  Cardiovascular:     Rate and Rhythm: Normal rate and regular rhythm.     Heart sounds: No murmur heard. Pulmonary:     Effort: Pulmonary effort is normal. No respiratory distress.     Breath sounds: Normal breath sounds.  Abdominal:     Palpations: Abdomen is soft.     Tenderness: There is no abdominal tenderness.  Musculoskeletal:        General: No swelling.     Cervical back: Neck supple.     Comments: Decreased DP pulses, cool to touch  Skin:    General: Skin is warm and dry.     Capillary Refill: Capillary refill takes less than 2 seconds.  Neurological:     Mental Status: He is alert.  Psychiatric:        Mood and Affect: Mood normal.     ED Results and Treatments Labs (all labs ordered are listed, but only abnormal results are displayed) Labs Reviewed  COMPREHENSIVE METABOLIC PANEL  CBC WITH DIFFERENTIAL/PLATELET  Radiology VAS [place] LOWER EXTREMITY VENOUS (DVT) (7a-7p)  Result Date: 09/03/2022  Lower Venous DVT Study Patient Name:  HAWARD POPE  Date of Exam:   09/03/2022 Medical Rec #: [id number]      Accession #:    [id number] Date of Birth: December 27, 1940      Patient Gender: M Patient Age:   40 years Exam [facility]  [facility] Procedure:      VAS [place] LOWER EXTREMITY VENOUS (DVT) Referring Phys: Joanette Gula --------------------------------------------------------------------------------  Indications: Pain, Swelling, and Cool foot.  Risk Factors: History of calcified vessels with normal triphasic waveforms by ABI done 12/11/18. Comparison Study: Prior negative left LEV done 05/15/20 Performing Technologist: Sharion Dove RVS   Examination Guidelines: A complete evaluation includes B-mode imaging, spectral Doppler, color Doppler, and power Doppler as needed of all accessible portions of each vessel. Bilateral testing is considered an integral part of a complete examination. Limited examinations for reoccurring indications may be performed as noted. The reflux portion of the exam is performed with the patient in reverse Trendelenburg.  +-----+---------------+---------+-----------+----------+--------------+ RIGHTCompressibilityPhasicitySpontaneityPropertiesThrombus Aging +-----+---------------+---------+-----------+----------+--------------+ CFV  Full           Yes      Yes                                 +-----+---------------+---------+-----------+----------+--------------+   +---------+---------------+---------+-----------+----------+--------------+ LEFT     CompressibilityPhasicitySpontaneityPropertiesThrombus Aging +---------+---------------+---------+-----------+----------+--------------+ CFV      Full           Yes      Yes                                 +---------+---------------+---------+-----------+----------+--------------+ SFJ      Full                                                        +---------+---------------+---------+-----------+----------+--------------+ FV Prox  Full                                                        +---------+---------------+---------+-----------+----------+--------------+ FV Mid   Full                                                        +---------+---------------+---------+-----------+----------+--------------+ FV DistalFull                                                        +---------+---------------+---------+-----------+----------+--------------+ PFV      Full                                                        +---------+---------------+---------+-----------+----------+--------------+  POP      Full           Yes       Yes                                 +---------+---------------+---------+-----------+----------+--------------+ PTV      Full                                                        +---------+---------------+---------+-----------+----------+--------------+ PERO     Full                                                        +---------+---------------+---------+-----------+----------+--------------+     Summary: RIGHT: - No evidence of common femoral vein obstruction.  LEFT: - There is no evidence of deep vein thrombosis in the lower extremity.  - No cystic structure found in the popliteal fossa.  *See table(s) above for measurements and observations. Electronically signed by Harold Barban MD on 09/03/2022 at 8:53:28 PM.    Final     Pertinent labs & imaging results that were available during my care of the patient were reviewed by me and considered in my medical decision making (see MDM for details).  Medications Ordered in ED Medications - No data to display                                                                                                                                   Procedures Procedures  (including critical care time)  Medical Decision Making / ED Course   This patient presents to the ED for concern of leg pain, this involves an extensive number of treatment options, and is a complaint that carries with it a high risk of complications and morbidity.  The differential diagnosis includes claudication, lower extremity ischemia, DVT, fracture, neuropathy  MDM: Patient seen emergency room for evaluation of leg pain.  Physical exam with bilateral lower extremities cool to touch with very faintly dopplerable pulses.  Neurologic exam unremarkable.  At time of signout, patient pending laboratory evaluation and ultimate CT vessel imaging.  Anticipate discharge with vascular follow-up if no critical findings.  Please see provider signout note for continuation of  workup.   Additional history obtained: -Additional history obtained from multiple family members -External records from outside source obtained and reviewed including: Chart review including previous notes, labs, imaging, consultation notes   Lab Tests: -I ordered, reviewed, and interpreted labs.   The pertinent results include:   Labs Reviewed  COMPREHENSIVE METABOLIC PANEL  CBC WITH DIFFERENTIAL/PLATELET      Imaging Studies ordered: I ordered imaging studies including DVT ultrasound I independently visualized and interpreted imaging. I agree with the radiologist interpretation  CT aortobifem pending   Medicines ordered and prescription drug management: No orders of the defined types were placed in this encounter.   -I have reviewed the patients home medicines and have made adjustments as needed  Critical interventions none   Cardiac Monitoring: The patient was maintained on a cardiac monitor.  I personally viewed and interpreted the cardiac monitored which showed an underlying rhythm of: NSR  Social Determinants of Health:  Factors impacting patients care include: none   Reevaluation: After the interventions noted above, I reevaluated the patient and found that they have :stayed the same  Co morbidities that complicate the patient evaluation  Past Medical History:  Diagnosis Date   Acute ischemic stroke ([place]) 09/15/2015   Ankle fracture, left 2009   rod placed   Ankle fracture, left    rod placed   Anxiety    Aspirin allergy    a. Anaphylactic rxn.   Barrett's esophagus    CAD (coronary artery disease)    a. Inf STEMI 07/2013: s/p DES x 2 from ostial to [facility] 07/13/13. Residual Cx disease for med rx (PCI if recurrent pain).   Controlled gout    Dyslipidemia    ED (erectile dysfunction)    Edema of both legs    Gastric polyp    GERD (gastroesophageal reflux disease)    Hiatal hernia    History of nuclear stress test    Nuclear stress test 5/19: EF  53, no ischemia, no infarction; Low Risk   Hydrocele of testis    Hyperglycemia    a. A1C 5.8 in 07/2013.   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  Please see provider signout for continuation of workup.     Final Clinical Impression(s) / ED Diagnoses Final diagnoses:  None     '@PCDICTATION'$ @    Teressa Lower, MD 09/04/22 0145

## 2022-09-03 NOTE — ED Provider Triage Note (Signed)
Emergency Medicine Provider Triage Evaluation Note  Matthew Liu , a 81 y.o. male  was evaluated in triage.  Pt complains of lower left leg pain.  Pain has been off and on for the last 3 weeks.  Pain is primarily in his left foot extends up into his calf.  Pain is more like tenderness.  States in the last week pain left leg making it difficult to bear weight and walk.  Denies shortness of breath or chest pain.  Was evaluated for this complaint earlier today when physicians office, could not palpate pulses in left or right foot.  Review of Systems  Positive: See above Negative: See above  Physical Exam  BP (!) 123/52 (BP Location: Right Arm)   Pulse 66   Temp 98.1 F (36.7 C) (Oral)   Resp 15   SpO2 97%  Gen:   Awake, no distress   Resp:  Normal effort  MSK:   Moves extremities without difficulty  Other:  DP pulses 1+ in both right and left feet.  Left calf tender to palpation  Medical Decision Making  Medically screening exam initiated at 3:01 PM.  Appropriate orders placed.  GURNEY BALTHAZOR was informed that the remainder of the evaluation will be completed by another provider, this initial triage assessment does not replace that evaluation, and the importance of remaining in the ED until their evaluation is complete.  Work up initiated   Harriet Pho, PA-C 09/03/22 1509

## 2022-09-03 NOTE — Progress Notes (Addendum)
VASCULAR LAB    Left lower extremity venous duplex has been performed.  See CV proc for preliminary results.  Messaged results to Joanette Gula, PA-C Mykal Kirchman, [place], RVT 09/03/2022, 5:21 PM

## 2022-09-04 ENCOUNTER — Emergency Department (HOSPITAL_COMMUNITY): Payer: Medicare PPO

## 2022-09-04 DIAGNOSIS — K5730 Diverticulosis of large intestine without perforation or abscess without bleeding: Secondary | ICD-10-CM | POA: Diagnosis not present

## 2022-09-04 DIAGNOSIS — I714 Abdominal aortic aneurysm, without rupture, unspecified: Secondary | ICD-10-CM | POA: Diagnosis not present

## 2022-09-04 DIAGNOSIS — M79605 Pain in left leg: Secondary | ICD-10-CM | POA: Diagnosis not present

## 2022-09-04 DIAGNOSIS — K8020 Calculus of gallbladder without cholecystitis without obstruction: Secondary | ICD-10-CM | POA: Diagnosis not present

## 2022-09-04 MED ORDER — IOHEXOL 350 MG/ML SOLN
100.0000 mL | Freq: Once | INTRAVENOUS | Status: AC | PRN
  Administered 2022-09-04: 100 mL via INTRAVENOUS

## 2022-09-04 NOTE — ED Notes (Signed)
RN reviewed discharge instructions with pt. Pt verbalized understanding and had no further questions. VSS upon discharge.  

## 2022-09-04 NOTE — ED Notes (Signed)
Patient transported to CT 

## 2022-09-05 ENCOUNTER — Telehealth: Payer: Self-pay | Admitting: Pulmonary Disease

## 2022-09-06 NOTE — Telephone Encounter (Signed)
Advise to hold the drug for now. I will discuss at time of return visit on 12/8

## 2022-09-06 NOTE — Telephone Encounter (Signed)
Called and spoke with pt who states that he has been having some side effects from the pirfenidone. States that he has been drowsy since day one of taking the pirfenidone and also has been having bouts of nausea. Pt denies any complaints of diarrhea.  States that this has been much better than the OFEV but due to some of the side effects he is having, he wants to know what might be recommended.  Pt said that he is taking 3 pills three times daily. Dr. Vaughan Browner, please advise on this.

## 2022-09-07 NOTE — Telephone Encounter (Signed)
Attempted to call pt but unable to reach and unable to leave VM. Will try to call back later. 

## 2022-09-07 NOTE — Telephone Encounter (Signed)
ATC x2.  No answer.  VM full.

## 2022-09-08 ENCOUNTER — Other Ambulatory Visit: Payer: Self-pay | Admitting: *Deleted

## 2022-09-08 DIAGNOSIS — I739 Peripheral vascular disease, unspecified: Secondary | ICD-10-CM

## 2022-09-08 NOTE — Telephone Encounter (Signed)
Spoke with the pt's spouse and notified of response per Dr Vaughan Browner  She verbalized understanding  Pt to keep rov to discuss further

## 2022-09-12 ENCOUNTER — Encounter: Payer: Self-pay | Admitting: Physician Assistant

## 2022-09-12 ENCOUNTER — Ambulatory Visit: Payer: Medicare PPO | Admitting: Physician Assistant

## 2022-09-12 ENCOUNTER — Encounter: Payer: Self-pay | Admitting: Surgery

## 2022-09-12 ENCOUNTER — Ambulatory Visit (HOSPITAL_COMMUNITY)
Admission: RE | Admit: 2022-09-12 | Discharge: 2022-09-12 | Disposition: A | Discharge status: Home / Self Care | Payer: Medicare PPO | Source: Ambulatory Visit | Attending: Surgery | Admitting: Surgery

## 2022-09-12 ENCOUNTER — Ambulatory Visit: Payer: Medicare PPO | Admitting: Surgery

## 2022-09-12 VITALS — BP 112/60 | HR 68 | Ht 63.0 in | Wt 160.8 lb

## 2022-09-12 VITALS — BP 123/68 | HR 50 | Temp 98.6°F | Resp 20 | Ht 63.0 in | Wt 160.0 lb

## 2022-09-12 DIAGNOSIS — I739 Peripheral vascular disease, unspecified: Secondary | ICD-10-CM | POA: Insufficient documentation

## 2022-09-12 DIAGNOSIS — I70213 Atherosclerosis of native arteries of extremities with intermittent claudication, bilateral legs: Secondary | ICD-10-CM

## 2022-09-12 DIAGNOSIS — I10 Essential (primary) hypertension: Secondary | ICD-10-CM | POA: Diagnosis not present

## 2022-09-12 DIAGNOSIS — E785 Hyperlipidemia, unspecified: Secondary | ICD-10-CM | POA: Insufficient documentation

## 2022-09-12 DIAGNOSIS — I2510 Atherosclerotic heart disease of native coronary artery without angina pectoris: Secondary | ICD-10-CM | POA: Diagnosis not present

## 2022-09-12 MED ORDER — GABAPENTIN 300 MG PO CAPS: 300.0000 mg | ORAL_CAPSULE | Freq: Every day | ORAL | 1 refills | Status: DC

## 2022-09-12 NOTE — Progress Notes (Signed)
[facility] [place]  Patient name: Matthew Liu MRN: [id number] DOB: 24-Jan-1941 Sex: male   REQUESTING PROVIDER:    ED   REASON FOR CONSULT:    PAD  HISTORY OF PRESENT ILLNESS:   Matthew Liu is a 81 y.o. male, who is referred from the emergency department for evaluation of left leg pain.  He initially presented to the urgent care in [place] where they noticed his foot was discolored and they could not find a pulse and so they sent to the emergency department.  He underwent a CT scan that did not show any obvious lesions.  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  The patient states that his pain began approximately 1 month ago.  The left leg bothers him more than the right.  He says it is worse when he wakes up in the morning.  Walking helps alleviate his pain.  His foot will turn color from red to blue.  He does have a history of a fall off of a ladder in 2019 with a back fracture.  He will occasionally have some shooting pains going down the back of his leg.  He has a history of ischemic stroke in 2016.  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  He has coronary artery disease, status post CABG x5 in 2020 he takes a statin for hypercholesterolemia.  He is medically managed for hypertension with an ACE inhibitor he is a non-smoker  PAST MEDICAL HISTORY    Past Medical History:  Diagnosis Date   Acute ischemic stroke ([place]) 09/15/2015   Ankle fracture, left 2009   rod placed   Ankle fracture, left    rod placed   Anxiety    Aspirin allergy    a. Anaphylactic rxn.   Barrett's esophagus    CAD (coronary artery disease)    a. Inf STEMI 07/2013: s/p DES x 2 from ostial to [facility] 07/13/13. Residual Cx disease for med rx (PCI if recurrent pain).   Controlled gout    Dyslipidemia    ED (erectile dysfunction)    Edema of Liu legs    Gastric polyp    GERD (gastroesophageal reflux disease)    Hiatal hernia    History of nuclear  stress test    Nuclear stress test 5/19: EF 53, no ischemia, no infarction; Low Risk   Hydrocele of testis    Hyperglycemia    a. A1C 5.8 in 07/2013.   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   OSA (obstructive sleep apnea)    Severe obstructive sleep apnea with an AHI of 40.4/h on auto CPAP   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   LIMA to LAD, SVG to Diag, Sequential SVG to OM2-OM3, SVG to RCA, EVH via right thigh and leg   Unstable angina (HCC)      FAMILY HISTORY   Family History  Problem Relation Age of Onset   Hypertension Mother    Ovarian cancer Mother    Heart failure Father    Stroke Neg Hx     SOCIAL HISTORY:   Social History   Socioeconomic History   Marital status: Married    Spouse name: [name]   Number of children: 2   Years of education: 12   Highest education level: Not on file  Occupational History   Occupation: Contractor  Tobacco Use   Smoking status:  Never   Smokeless tobacco: Never  Vaping Use   Vaping Use: Never used  Substance and Sexual Activity   Alcohol use: Yes    Comment: occasional beer    Drug use: No   Sexual activity: Not Currently  Other Topics Concern   Not on file  Social History Narrative   Lives with wife   Caffeine use: 1-2 cups coffee per day   Right handed   Social Determinants of Health   Financial Resource Strain: Not on file  Food Insecurity: Not on file  Transportation Needs: Not on file  Physical Activity: Not on file  Stress: Not on file  Social Connections: Not on file  Intimate Partner Violence: Not on file    ALLERGIES:    Allergies  Allergen Reactions   Aspirin Hives and Shortness Of Breath    "Shortness of Breath" can't breathe   Quinine Derivatives Hives and Other (See Comments)    "looks like he has the measles"    CURRENT MEDICATIONS:    Current Outpatient Medications  Medication Sig Dispense  Refill   acetaminophen (TYLENOL) 500 MG tablet Take 1 tablet (500 mg total) by mouth every 6 (six) hours as needed for mild pain or headache. 30 tablet 0   ALPRAZolam (XANAX) 0.25 MG tablet Take 0.25 mg by mouth 2 (two) times daily as needed for anxiety.      atorvastatin (LIPITOR) 80 MG tablet Take 1 tablet (80 mg total) by mouth every evening. 90 tablet 3   carbidopa-levodopa (SINEMET IR) 25-100 MG tablet Take 1 tablet by mouth 3 (three) times daily. 270 tablet 1   Cholecalciferol (VITAMIN D3) 50 MCG (2000 UT) capsule Take 2,000 Units by mouth daily.     clopidogrel (PLAVIX) 75 MG tablet TAKE 1 TABLET BY MOUTH EVERY DAY (Patient taking differently: Take 75 mg by mouth daily.) 90 tablet 2   hydrochlorothiazide (MICROZIDE) 12.5 MG capsule TAKE 1 CAPSULE BY MOUTH EVERY DAY 90 capsule 3   lisinopril (ZESTRIL) 40 MG tablet Take 0.5 tablets (20 mg total) by mouth at bedtime.     metoprolol tartrate (LOPRESSOR) 25 MG tablet TAKE 1/2 TABLET TWICE A DAY BY MOUTH  - Pt must make appt by April, 2024 90 tablet 0   mirtazapine (REMERON) 7.5 MG tablet Take 7.5 mg by mouth at bedtime.     Multiple Vitamins-Minerals (ONE-A-DAY MENS 50+) TABS Take 1 tablet by mouth daily with breakfast.     pantoprazole (PROTONIX) 40 MG tablet Take 1 tablet (40 mg total) by mouth daily. Please call [phone] to schedule an office visit for more refills 90 tablet 1   Pirfenidone (ESBRIET) 267 MG TABS Take 3 tablets (801 mg total) by mouth 3 (three) times daily with meals. Month 2 and onwards 270 tablet 4   No current facility-administered medications for this visit.    REVIEW OF SYSTEMS:   '[X]'$  denotes positive finding, '[ ]'$  denotes negative finding Cardiac  Comments:  Chest pain or chest pressure:    Shortness of breath upon exertion:    Short of breath when lying flat:    Irregular heart rhythm:        Vascular    Pain in calf, thigh, or hip brought on by ambulation:    Pain in feet at night that wakes you up from  your sleep:     Blood clot in your veins:    Leg swelling:         Pulmonary    Oxygen  at home:    Productive cough:     Wheezing:         Neurologic    Sudden weakness in arms or legs:     Sudden numbness in arms or legs:     Sudden onset of difficulty speaking or slurred speech:    Temporary loss of vision in one eye:     Problems with dizziness:         Gastrointestinal    Blood in stool:      Vomited blood:         Genitourinary    Burning when urinating:     Blood in urine:        Psychiatric    Major depression:         Hematologic    Bleeding problems:    Problems with blood clotting too easily:        Skin    Rashes or ulcers:        Constitutional    Fever or chills:     PHYSICAL EXAM:   There were no vitals filed for this visit.  GENERAL: The patient is a well-nourished male, in no acute distress. The vital signs are documented above. CARDIAC: There is a regular rate and rhythm.  VASCULAR: Biphasic Doppler signals on Liu legs with hand-held Doppler.  The left foot is slightly bluish in color PULMONARY: Nonlabored respirations ABDOMEN: Soft and non-tender  MUSCULOSKELETAL: There are no major deformities or cyanosis. NEUROLOGIC: No focal weakness or paresthesias are detected. SKIN: There are no ulcers or rashes noted. PSYCHIATRIC: The patient has a normal affect.  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 Recommend follow-up every 2 years.   Reference: J Am Coll Radiol 6160;[id number].   Diffuse atherosclerotic changes are noted throughout the lower extremities bilaterally without focal hemodynamically significant stenosis.   Stable atherosclerotic changes are noted in the infrapopliteal vessels bilaterally although 2 vessel runoff to the right ankle is noted with three-vessel runoff to the left ankle identified. No acute arterial abnormality is  noted.   NON-VASCULAR   Cholelithiasis without complicating factors.   Diverticulosis without diverticulitis.   No acute nonvascular abnormality is seen.  ABI/TBIToday's ABIToday's TBIPrevious ABIPrevious TBI  +-------+-----------+-----------+------------+------------+  Right [place]                                   +-------+-----------+-----------+------------+------------+  Left  [place]         0.80                                 +-------+-----------+-----------+------------+------------+   Waveforms are biphasic on the right and triphasic on the left at the ankle  ASSESSMENT and PLAN   AAA: Maximum diameter on CT scan was 3.5 cm.  I will repeat her ultrasound in 1 year  PAD: No significant atherosclerotic vascular stenoses in the outflow or inflow vessels.  He does have calcified tibial vessels on CT scan.  His ABIs are noncompressible but waveforms are multiphasic.  I do not think that his vascular disease is contributing to his left leg pain.  This sounds more neuropathic in origin.  This is causing him significant trouble with pain.  I am going to  give him gabapentin to see if this will help.  He will follow-up in 1 year with ABIs.  He is going to schedule an appointment with his primary doctor for further work-up of his leg pain in the near future.   Leia Alf, MD, FACS [facility] [phone] Pager [phone]

## 2022-09-12 NOTE — Patient Instructions (Signed)
Medication Instructions:  Your physician recommends that you continue on your current medications as directed. Please refer to the Current Medication list given to you today.  *If you need a refill on your cardiac medications before your next appointment, please call your pharmacy*   Lab Work: None If you have labs (blood work) drawn today and your tests are completely normal, you will receive your results only by: Kathryn (if you have MyChart) OR A paper copy in the mail If you have any lab test that is abnormal or we need to change your treatment, we will call you to review the results.   Follow-Up: At [facility], you and your health needs are our priority.  As part of our continuing mission to provide you with exceptional heart care, we have created designated Provider Care Teams.  These Care Teams include your primary Cardiologist (physician) and Advanced Practice Providers (APPs -  Physician Assistants and Nurse Practitioners) who all work together to provide you with the care you need, when you need it.  Your next appointment:   02/03/2023 at 3:00 PM  The format for your next appointment:   In Person  Provider:   Dr Radford Pax  Important Information About Sugar

## 2022-09-12 NOTE — Progress Notes (Signed)
Office Visit    Patient Name: Matthew Liu Date of Encounter: 09/12/2022  PCP:  Nicoletta Dress, MD   [place]  Cardiologist:  Sinclair Grooms, MD  Advanced Practice Provider:  No care team member to display Electrophysiologist:  None   HPI    LEVIATHAN MACERA is a 81 y.o. male with a past medical history of CVA, Barrett's esophagus, CAD status post MI and DES placement and ultimately CABG, OSA presents today for hospital follow-up appointment.  He was seen in the emergency room for evaluation of left lower extremity pain and concern for ischemia.  Patient was seen in [place] where providers at urgent care were unable to obtain a palpable pulse and sent the patient to the emergency department for further evaluation.  They recommended EMS transfer but patient refused and drove himself to the emergency department.  He was having lower extremity symptoms for about a week and endorsed severe leg pain worse at night.  He denied chest pain, shortness of breath, abdominal pain, nausea, vomiting. CT aortobifem ordered.   Today, he overall is feeling well without any significant symptoms.  He was having lower leg pain that evaluated by vascular earlier today and this was thought to be due to neuropathy.  EKG since he had a regular heart block shows normal sinus rhythm PACs.  He wanted to discuss his metoprolol and why he was taking this medication. We discussed the reasons and he agreed. No chest pain or SOB.  Reports no shortness of breath nor dyspnea on exertion. Reports no chest pain, pressure, or tightness. No edema, orthopnea, PND. Reports no palpitations.   Past Medical History    Past Medical History:  Diagnosis Date   Acute ischemic stroke ([place]) 09/15/2015   Ankle fracture, left 2009   rod placed   Ankle fracture, left    rod placed   Anxiety    Aspirin allergy    a. Anaphylactic rxn.   Barrett's esophagus    CAD (coronary artery disease)    a. Inf  STEMI 07/2013: s/p DES x 2 from ostial to [facility] 07/13/13. Residual Cx disease for med rx (PCI if recurrent pain).   Controlled gout    Dyslipidemia    ED (erectile dysfunction)    Edema of both legs    Gastric polyp    GERD (gastroesophageal reflux disease)    Hiatal hernia    History of nuclear stress test    Nuclear stress test 5/19: EF 53, no ischemia, no infarction; Low Risk   Hydrocele of testis    Hyperglycemia    a. A1C 5.8 in 07/2013.   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   OSA (obstructive sleep apnea)    Severe obstructive sleep apnea with an AHI of 40.4/h on auto CPAP   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   LIMA to LAD, SVG to Diag, Sequential SVG to OM2-OM3, SVG to RCA, EVH via right thigh and leg   Unstable angina [facility])    Past Surgical History:  Procedure Laterality Date   BACK SURGERY     L1 kyphoplasty by Dr Trenton Gammon 03/2018     thoractic [id number]   cataract surgery      bilateral    COLONOSCOPY     CORONARY ANGIOPLASTY     stents    CORONARY ARTERY BYPASS GRAFT N/A 12/12/2018  Procedure: CORONARY ARTERY BYPASS GRAFTING (CABG);  Surgeon: Rexene Alberts, MD;  Location: [place];  Service: Open Heart Surgery;  Laterality: N/A;   ESOPHAGOGASTRODUODENOSCOPY  04/04/2016   Short-segment Barrett's esophagus (biopsied) Gastric polyps (status post polypectomy x1) Hiatal hernia   HYDROCELE EXCISION Left 07/17/2014   Procedure: LEFT HYDROCELECTOMY;  Surgeon: Malka So, MD;  Location: [facility];  Service: Urology;  Laterality: Left;   left ankle surgery  1999   LEFT HEART CATH AND CORONARY ANGIOGRAPHY N/A 12/11/2018   Procedure: LEFT HEART CATH AND CORONARY ANGIOGRAPHY;  Surgeon: Belva Crome, MD;  Location: [place] [facility] LAB;  Service: Cardiovascular;  Laterality: N/A;   LEFT HEART CATHETERIZATION WITH CORONARY ANGIOGRAM N/A 07/13/2013   Procedure: LEFT HEART CATHETERIZATION WITH CORONARY ANGIOGRAM;   Surgeon: Sinclair Grooms, MD;  Location: [facility] CATH LAB;  Service: Cardiovascular;  Laterality: N/A;   PERCUTANEOUS CORONARY STENT INTERVENTION (PCI-S)  07/13/2013   Procedure: PERCUTANEOUS CORONARY STENT INTERVENTION (PCI-S);  Surgeon: Sinclair Grooms, MD;  Location: [facility] CATH LAB;  Service: Cardiovascular;;   TEE WITHOUT CARDIOVERSION N/A 12/12/2018   Procedure: TRANSESOPHAGEAL ECHOCARDIOGRAM (TEE);  Surgeon: Rexene Alberts, MD;  Location: [place];  Service: Open Heart Surgery;  Laterality: N/A;   TONSILLECTOMY      Allergies  Allergies  Allergen Reactions   Aspirin Hives and Shortness Of Breath    "Shortness of Breath" can't breathe   Quinine Derivatives Hives and Other (See Comments)    "looks like he has the measles"    EKGs/Labs/Other Studies Reviewed:   The following studies were reviewed today:  VAS ABIs 09/12/22  Summary:  Right: Resting right ankle-brachial index indicates noncompressible right  lower extremity arteries with biphasic waveforms.  The right great toe is non-compressible.   Left: Resting left ankle-brachial index indicates noncompressible left  lower extremity arteries with triphasic waveforms. The left toe-brachial  index is normal.   EKG:  EKG is  ordered today.  The ekg ordered today demonstrates normal sinus rhythm, rate 66 bpm with first-degree AV block and PACs  Recent Labs: 03/26/2022: Magnesium 1.7 07/06/2022: NT-Pro BNP 518 09/03/2022: ALT 17; BUN 31; Creatinine, Ser 1.84; Hemoglobin 12.3; Platelets 169; Potassium 4.1; Sodium 139  Recent Lipid Panel    Component Value Date/Time   CHOL 108 03/20/2017 1331   TRIG 87 03/20/2017 1331   HDL 45 03/20/2017 1331   CHOLHDL 2.4 03/20/2017 1331   CHOLHDL 2.7 09/15/2015 0205   VLDL 22 09/15/2015 0205   LDLCALC 46 03/20/2017 1331    Home Medications   Current Meds  Medication Sig   acetaminophen (TYLENOL) 500 MG tablet Take 1 tablet (500 mg total) by mouth every 6 (six) hours as needed for mild pain  or headache.   ALPRAZolam (XANAX) 0.25 MG tablet Take 0.25 mg by mouth 2 (two) times daily as needed for anxiety.    atorvastatin (LIPITOR) 80 MG tablet Take 1 tablet (80 mg total) by mouth every evening.   carbidopa-levodopa (SINEMET IR) 25-100 MG tablet Take 1 tablet by mouth 3 (three) times daily.   Cholecalciferol (VITAMIN D3) 50 MCG (2000 UT) capsule Take 2,000 Units by mouth daily.   clopidogrel (PLAVIX) 75 MG tablet TAKE 1 TABLET BY MOUTH EVERY DAY (Patient taking differently: Take 75 mg by mouth daily.)   gabapentin (NEURONTIN) 300 MG capsule Take 1 capsule (300 mg total) by mouth daily.   hydrochlorothiazide (MICROZIDE) 12.5 MG capsule TAKE 1 CAPSULE BY MOUTH EVERY DAY  lisinopril (ZESTRIL) 40 MG tablet Take 0.5 tablets (20 mg total) by mouth at bedtime.   metoprolol tartrate (LOPRESSOR) 25 MG tablet TAKE 1/2 TABLET TWICE A DAY BY MOUTH  - Pt must make appt by April, 2024   mirtazapine (REMERON) 7.5 MG tablet Take 7.5 mg by mouth at bedtime.   Multiple Vitamins-Minerals (ONE-A-DAY MENS 50+) TABS Take 1 tablet by mouth daily with breakfast.   pantoprazole (PROTONIX) 40 MG tablet Take 1 tablet (40 mg total) by mouth daily. Please call [phone] to schedule an office visit for more refills   Pirfenidone (ESBRIET) 267 MG TABS Take 3 tablets (801 mg total) by mouth 3 (three) times daily with meals. Month 2 and onwards     Review of Systems      All other systems reviewed and are otherwise negative except as noted above.  Physical Exam    VS:  BP 112/60   Pulse 68   Ht '5\' 3"'$  (1.6 m)   Wt 160 lb 12.8 oz (72.9 kg)   SpO2 95%   BMI 28.48 kg/m  , BMI Body mass index is 28.48 kg/m.  Wt Readings from Last 3 Encounters:  09/12/22 160 lb 12.8 oz (72.9 kg)  09/12/22 160 lb (72.6 kg)  07/27/22 171 lb (77.6 kg)     GEN: Well nourished, well developed, in no acute distress. HEENT: normal. Neck: Supple, no JVD, carotid bruits, or masses. Cardiac: irregularly irregular, no murmurs,  rubs, or gallops. No clubbing, cyanosis, edema.  Radials/PT 2+ and equal bilaterally.  Respiratory:  Respirations regular and unlabored, clear to auscultation bilaterally. GI: Soft, nontender, nondistended. MS: No deformity or atrophy. Skin: Warm and dry, no rash. Neuro:  Strength and sensation are intact. Psych: Normal affect.  Assessment & Plan    PACs -EKG performed today which showed NSR, 1st degree AV block and PACs. No Afib -continue current medications -sometimes he feels the skipped beats but most of the time he is asymptomatic  CAD s/p prior stenting and CABG 2020 -he never really had any chest pains or SOB -he was tired and run down at the time which was his anginal equivalent  -continue current medication regimen, Plavix 75 g daily, lisinopril 20 mg, Lopressor 12.5 mg twice daily -he is able to do all that he wants to do  Hypertension -well controlled today -Continue current medication regimen  Hyperlipidemia -Recent LDL was 51 -We will be due for repeat lipid panel at next visit if not already done by PCP -Continue Lipitor 80 mg daily  PAD/neuropathy -Seen by vascular today arterial disease.  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  Started on gabapentin.         Disposition: Follow up 4 months with Sinclair Grooms, MD or APP.  Signed, Elgie Collard, PA-C 09/12/2022, 4:50 PM [facility]

## 2022-09-13 ENCOUNTER — Other Ambulatory Visit (HOSPITAL_COMMUNITY): Payer: Self-pay

## 2022-09-14 ENCOUNTER — Other Ambulatory Visit (HOSPITAL_COMMUNITY): Payer: Self-pay

## 2022-09-16 ENCOUNTER — Ambulatory Visit: Payer: Medicare PPO | Admitting: Pulmonary Disease

## 2022-09-16 ENCOUNTER — Encounter: Payer: Self-pay | Admitting: Pulmonary Disease

## 2022-09-16 VITALS — BP 128/62 | HR 70 | Temp 97.6°F | Ht 68.0 in | Wt 163.4 lb

## 2022-09-16 DIAGNOSIS — J84112 Idiopathic pulmonary fibrosis: Secondary | ICD-10-CM | POA: Diagnosis not present

## 2022-09-16 DIAGNOSIS — Z5181 Encounter for therapeutic drug level monitoring: Secondary | ICD-10-CM

## 2022-09-16 NOTE — Patient Instructions (Signed)
I am glad you are tolerating the Esbriet well Will get labs for monitoring in 1 month time Return to clinic in 3 months

## 2022-09-16 NOTE — Progress Notes (Signed)
Matthew Liu    [id number]    1941/05/29  Primary Care Physician:Schultz, Lora Havens, MD  Referring Physician: Nicoletta Dress, MD [place],   [id number]  Chief complaint: Follow-up for interstitial lung disease  HPI: 81 y.o.  with history of hypertension, coronary artery disease, hyperlipidemia, allergies, GERD He had an abnormal x-ray at his primary care with a follow-up CT showing UIP pattern pulmonary fibrosis at [facility] and has been referred here for further evaluation Complains of mild dyspnea on exertion.  No cough or fevers or chills  He is also being worked up for sleep apnea.  A previous sleep study was nondiagnostic as he did not sleep and he has a repeat in lab sleep study on 08/27/2021.  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  No feather pillows or comforter ILD questionnaire 08/26/2021-negative Smoking history: Never smoker Travel history: No significant travel history Relevant family history: No family history of lung disease  Interim history: Started on Ofev in February 2023 This was poorly tolerated with significant fatigue, diarrhea.  He was eventually treated in the ED in 03/24/2022 with labs showing elevated creatinine.  He was given IV fluids and discharged the next day.  His follow-up creatinine has normalized  He was taken off Ofev and started on Esbriet in October 2023.  He has had some initial difficulties with the medication but is tolerating it better now.  No new complaints.  Outpatient Encounter Medications as of 09/16/2022  Medication Sig   acetaminophen (TYLENOL) 500 MG tablet Take 1 tablet (500 mg total) by mouth every 6 (six) hours as needed for mild pain or headache.   ALPRAZolam (XANAX) 0.25 MG tablet Take 0.25 mg by mouth 2 (two) times daily as needed for anxiety.    atorvastatin (LIPITOR) 80 MG tablet Take 1 tablet  (80 mg total) by mouth every evening.   carbidopa-levodopa (SINEMET IR) 25-100 MG tablet Take 1 tablet by mouth 3 (three) times daily.   Cholecalciferol (VITAMIN D3) 50 MCG (2000 UT) capsule Take 2,000 Units by mouth daily.   clopidogrel (PLAVIX) 75 MG tablet TAKE 1 TABLET BY MOUTH EVERY DAY (Patient taking differently: Take 75 mg by mouth daily.)   gabapentin (NEURONTIN) 300 MG capsule Take 1 capsule (300 mg total) by mouth daily.   hydrochlorothiazide (MICROZIDE) 12.5 MG capsule TAKE 1 CAPSULE BY MOUTH EVERY DAY   lisinopril (ZESTRIL) 40 MG tablet Take 0.5 tablets (20 mg total) by mouth at bedtime.   metoprolol tartrate (LOPRESSOR) 25 MG tablet TAKE 1/2 TABLET TWICE A DAY BY MOUTH  - Pt must make appt by April, 2024   mirtazapine (REMERON) 7.5 MG tablet Take 7.5 mg by mouth at bedtime.   Multiple Vitamins-Minerals (ONE-A-DAY MENS 50+) TABS Take 1 tablet by mouth daily with breakfast.   pantoprazole (PROTONIX) 40 MG tablet Take 1 tablet (40 mg total) by mouth daily. Please call [phone] to schedule an office visit for more refills   Pirfenidone (ESBRIET) 267 MG TABS Take 3 tablets (801 mg total) by mouth 3 (three) times daily with meals. Month 2 and onwards   No facility-administered encounter medications on file as of 09/16/2022.   Physical Exam: Blood pressure 128/62, pulse 70, temperature 97.6 F (36.4 C), temperature source Oral, height '5\' 8"'$  (1.727 m), weight 163 lb 6.4 oz (74.1 kg), SpO2 97 %. Gen:  No acute distress HEENT:  EOMI, sclera anicteric Neck:     No masses; no thyromegaly Lungs:    Bibasal crackles CV:         Regular rate and rhythm; no murmurs Abd:      + bowel sounds; soft, non-tender; no palpable masses, no distension Ext:    No edema; adequate peripheral perfusion Skin:      Warm and dry; no rash Neuro: alert and oriented x 3 Psych: normal mood and affect   Data Reviewed: Imaging: CT chest 01/08/2018- basal predominant traction bronchiectasis,  reticulation High-res CT 06/23/2021-mild progression of basilar predominant fibrotic changes with minimal honeycombing in the right middle lobe base.  UIP pattern.  High-resolution CT 07/13/2022 Reviewed images from [facility] Examination is significantly limited by motion artifact.  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  Mild groundglass opacities in the superior segment of the left lower lobe Coronary artery disease.  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  High suspicion for IPF He does not have significant exposures or signs and symptoms of connective tissue disease, CTD serologies are negative  We discussed his diagnosis and trajectory of disease.  We reviewed treatment options such as antifibrotics to slow down the progression of disease.    Started him on Ofev but that is poorly tolerated with significant diarrhea, fatigue and dehydration We discussed alternate treatment and have decided to start Esbriet Recent LFTs are okay.  Continue monthly monitoring.  OSA On CPAP.  Managed by Dr. Radford Pax, cardiology  Plan/Recommendations: Continue Esbriet. Labs for monitoring.  Marshell Garfinkel MD [facility] 09/16/2022, 11:04 AM  CC: Nicoletta Dress, MD

## 2022-09-20 ENCOUNTER — Other Ambulatory Visit: Payer: Self-pay

## 2022-09-21 ENCOUNTER — Other Ambulatory Visit: Payer: Self-pay

## 2022-09-27 ENCOUNTER — Telehealth: Payer: Self-pay | Admitting: Pharmacist

## 2022-09-27 NOTE — Telephone Encounter (Signed)
Per Rinaldo Ratel, patient's PA for pirfenidone set to expire. Submitted a Prior Authorization RENEWAL request to [facility] for PIRFENIDONE via CoverMyMeds. Will update once we receive a response.  Key: [id number]  Per automated response: Authorization already on file for this request. Authorization starting on 07/08/2022 and ending on 10/10/2023.  [employer] updated  Knox Saliva, PharmD, MPH, BCPS, CPP Clinical Pharmacist (Rheumatology and Pulmonology)

## 2022-09-30 ENCOUNTER — Telehealth: Payer: Self-pay | Admitting: Pulmonary Disease

## 2022-09-30 NOTE — Telephone Encounter (Signed)
Called Matthew Liu but she did not answer. Left message for her to call back.

## 2022-09-30 NOTE — Telephone Encounter (Signed)
Called patients daughter and she states that her dad is having really bad nausea and vomiting due to the medication he is currently on.   Tried Ofev in the past and failed due to significant diarrhea, fatigue and dehydration.   Esbriet is '267mg'$  3 tablets three times a day.   Daughter and patient is asking if something for the nausea and vomiting could be called in.   He has only thrown up 2 times this week but he states the nausea is bad.   Please advise sir since Dr Vaughan Browner is out of office

## 2022-09-30 NOTE — Telephone Encounter (Signed)
If he wants to try to stay on the [place], we can try zofran as below. If he keeps on having nausea he may have to stop the esbriet  Zofran '4mg'$  po up to every 6 h if needed for nausea

## 2022-09-30 NOTE — Telephone Encounter (Signed)
PT daughter calling stating the meds Dr. Jerilynn Mages sent for his lung issues makes him vomit. She wonders if Dr. Jerilynn Mages can send a script to alleviate nausea. She is not sure of the medication name. Her # is [phone]  [facility]ar[facility] [address] in [place]

## 2022-10-04 MED ORDER — ONDANSETRON HCL 4 MG PO TABS: 4.0000 mg | ORAL_TABLET | Freq: Four times a day (QID) | ORAL | 5 refills | Status: DC | PRN

## 2022-10-04 NOTE — Telephone Encounter (Signed)
Called and spoke with pt's daughter letting her know recs from [place] and she verbalized understanding. Rx for zofran has been sent to pt's preferred pharmacy. Nothing further needed.

## 2022-10-05 ENCOUNTER — Other Ambulatory Visit: Payer: Self-pay | Admitting: Gastroenterology

## 2022-10-11 ENCOUNTER — Other Ambulatory Visit (HOSPITAL_COMMUNITY): Payer: Self-pay

## 2022-10-18 ENCOUNTER — Other Ambulatory Visit: Payer: Self-pay

## 2022-10-18 ENCOUNTER — Other Ambulatory Visit (HOSPITAL_COMMUNITY): Payer: Self-pay

## 2022-10-18 ENCOUNTER — Other Ambulatory Visit (INDEPENDENT_AMBULATORY_CARE_PROVIDER_SITE_OTHER): Payer: Medicare PPO

## 2022-10-18 DIAGNOSIS — Z5181 Encounter for therapeutic drug level monitoring: Secondary | ICD-10-CM | POA: Diagnosis not present

## 2022-10-18 LAB — COMPREHENSIVE METABOLIC PANEL
ALT: 13 U/L (ref 0–53)
AST: 32 U/L (ref 0–37)
Albumin: 3.9 g/dL (ref 3.5–5.2)
Alkaline Phosphatase: 93 U/L (ref 39–117)
BUN: 26 mg/dL — ABNORMAL HIGH (ref 6–23)
CO2: 27 mEq/L (ref 19–32)
Calcium: 8.7 mg/dL (ref 8.4–10.5)
Chloride: 107 mEq/L (ref 96–112)
Creatinine, Ser: 1.19 mg/dL (ref 0.40–1.50)
GFR: 56.83 mL/min — ABNORMAL LOW (ref >60.00)
Glucose, Bld: 104 mg/dL — ABNORMAL HIGH (ref 70–99)
Potassium: 4.2 mEq/L (ref 3.5–5.1)
Sodium: 143 mEq/L (ref 135–145)
Total Bilirubin: 0.4 mg/dL (ref 0.2–1.2)
Total Protein: 6.7 g/dL (ref 6.0–8.3)

## 2022-10-20 ENCOUNTER — Other Ambulatory Visit: Payer: Self-pay | Admitting: Interventional Cardiology

## 2022-10-20 ENCOUNTER — Other Ambulatory Visit: Payer: Self-pay | Admitting: Gastroenterology

## 2022-10-20 ENCOUNTER — Other Ambulatory Visit: Payer: Self-pay

## 2022-10-24 ENCOUNTER — Other Ambulatory Visit (HOSPITAL_COMMUNITY): Payer: Self-pay

## 2022-10-24 ENCOUNTER — Other Ambulatory Visit: Payer: Self-pay

## 2022-10-25 ENCOUNTER — Other Ambulatory Visit: Payer: Self-pay

## 2022-10-25 DIAGNOSIS — R079 Chest pain, unspecified: Secondary | ICD-10-CM | POA: Diagnosis not present

## 2022-10-25 DIAGNOSIS — I440 Atrioventricular block, first degree: Secondary | ICD-10-CM | POA: Diagnosis not present

## 2022-10-25 DIAGNOSIS — S22050A Wedge compression fracture of T5-T6 vertebra, initial encounter for closed fracture: Secondary | ICD-10-CM | POA: Diagnosis not present

## 2022-10-25 DIAGNOSIS — K8020 Calculus of gallbladder without cholecystitis without obstruction: Secondary | ICD-10-CM | POA: Diagnosis not present

## 2022-10-25 DIAGNOSIS — R9431 Abnormal electrocardiogram [ECG] [EKG]: Secondary | ICD-10-CM | POA: Diagnosis not present

## 2022-10-25 DIAGNOSIS — J8410 Pulmonary fibrosis, unspecified: Secondary | ICD-10-CM | POA: Diagnosis not present

## 2022-10-25 DIAGNOSIS — X58XXXA Exposure to other specified factors, initial encounter: Secondary | ICD-10-CM | POA: Diagnosis not present

## 2022-10-25 DIAGNOSIS — J439 Emphysema, unspecified: Secondary | ICD-10-CM | POA: Diagnosis not present

## 2022-10-26 ENCOUNTER — Other Ambulatory Visit (HOSPITAL_COMMUNITY): Payer: Self-pay

## 2022-10-27 DIAGNOSIS — S22050A Wedge compression fracture of T5-T6 vertebra, initial encounter for closed fracture: Secondary | ICD-10-CM | POA: Diagnosis not present

## 2022-11-02 DIAGNOSIS — I700 Atherosclerosis of aorta: Secondary | ICD-10-CM | POA: Diagnosis not present

## 2022-11-02 DIAGNOSIS — R9431 Abnormal electrocardiogram [ECG] [EKG]: Secondary | ICD-10-CM | POA: Diagnosis not present

## 2022-11-02 DIAGNOSIS — R079 Chest pain, unspecified: Secondary | ICD-10-CM | POA: Diagnosis not present

## 2022-11-02 DIAGNOSIS — W19XXXA Unspecified fall, initial encounter: Secondary | ICD-10-CM | POA: Diagnosis not present

## 2022-11-02 DIAGNOSIS — S20212A Contusion of left front wall of thorax, initial encounter: Secondary | ICD-10-CM | POA: Diagnosis not present

## 2022-11-02 DIAGNOSIS — I712 Thoracic aortic aneurysm, without rupture, unspecified: Secondary | ICD-10-CM | POA: Diagnosis not present

## 2022-11-02 DIAGNOSIS — S22050A Wedge compression fracture of T5-T6 vertebra, initial encounter for closed fracture: Secondary | ICD-10-CM | POA: Diagnosis not present

## 2022-11-04 ENCOUNTER — Other Ambulatory Visit: Payer: Self-pay | Admitting: Gastroenterology

## 2022-11-07 DIAGNOSIS — S22050A Wedge compression fracture of T5-T6 vertebra, initial encounter for closed fracture: Secondary | ICD-10-CM | POA: Diagnosis not present

## 2022-11-09 ENCOUNTER — Telehealth: Payer: Self-pay

## 2022-11-09 NOTE — Telephone Encounter (Signed)
        Patient  visited [facility] on 11/02/2022  for treatment.   Telephone encounter attempt :  1st  A HIPAA compliant voice message was left requesting a return call.  Instructed patient to call back at [phone].   [place] Resource Care Guide   ??[name]  ?? [phone]   Website: [email or web address]  [email or web address]

## 2022-11-11 ENCOUNTER — Other Ambulatory Visit (HOSPITAL_COMMUNITY): Payer: Self-pay

## 2022-11-14 ENCOUNTER — Telehealth: Payer: Self-pay

## 2022-11-14 ENCOUNTER — Other Ambulatory Visit (HOSPITAL_COMMUNITY): Payer: Self-pay

## 2022-11-14 NOTE — Telephone Encounter (Signed)
        Patient  visited [facility] on 11/02/2022  for treatment.   Telephone encounter attempt :  2nd  A HIPAA compliant voice message was left requesting a return call.  Instructed patient to call back at [phone].   [place] Resource Care Guide   ??[email or web address]  ?? [phone]   Website: [email or web address]  [email or web address]

## 2022-11-16 ENCOUNTER — Other Ambulatory Visit (HOSPITAL_COMMUNITY): Payer: Self-pay

## 2022-11-17 ENCOUNTER — Other Ambulatory Visit (INDEPENDENT_AMBULATORY_CARE_PROVIDER_SITE_OTHER): Payer: Medicare PPO

## 2022-11-17 ENCOUNTER — Telehealth: Payer: Self-pay

## 2022-11-17 DIAGNOSIS — Z5181 Encounter for therapeutic drug level monitoring: Secondary | ICD-10-CM | POA: Diagnosis not present

## 2022-11-17 DIAGNOSIS — M5416 Radiculopathy, lumbar region: Secondary | ICD-10-CM | POA: Diagnosis not present

## 2022-11-17 LAB — COMPREHENSIVE METABOLIC PANEL
ALT: 9 U/L (ref 0–53)
AST: 24 U/L (ref 0–37)
Albumin: 3.6 g/dL (ref 3.5–5.2)
Alkaline Phosphatase: 109 U/L (ref 39–117)
BUN: 25 mg/dL — ABNORMAL HIGH (ref 6–23)
CO2: 28 mEq/L (ref 19–32)
Calcium: 9.1 mg/dL (ref 8.4–10.5)
Chloride: 104 mEq/L (ref 96–112)
Creatinine, Ser: 1.09 mg/dL (ref 0.40–1.50)
GFR: 63.1 mL/min (ref >60.00)
Glucose, Bld: 94 mg/dL (ref 70–99)
Potassium: 4.1 mEq/L (ref 3.5–5.1)
Sodium: 140 mEq/L (ref 135–145)
Total Bilirubin: 0.4 mg/dL (ref 0.2–1.2)
Total Protein: 6.5 g/dL (ref 6.0–8.3)

## 2022-11-17 NOTE — Telephone Encounter (Signed)
     Patient  visit on 11/02/2022  at [facility] was for chest pain.  Have you been able to follow up with your primary care physician?Patient has appointment today.  The patient was or was not able to obtain any needed medicine or equipment. Patient was able to obtain medication.  Are there diet recommendations that you are having difficulty following? Patient is following a high fiber diet with no issue.  Patient expresses understanding of discharge instructions and education provided has no other needs at this time. Yes   [place] Resource Care Guide   ??[email or web address]  ?? [phone]   Website: [email or web address]  [email or web address]

## 2022-11-18 ENCOUNTER — Ambulatory Visit: Payer: Medicare PPO | Admitting: Physician Assistant

## 2022-11-18 NOTE — Progress Notes (Deleted)
Office Visit    Patient Name: Matthew Liu Date of Encounter: 11/18/2022  PCP:  Nicoletta Dress, MD   [place]  Cardiologist:  Sinclair Grooms, MD (Inactive)  Advanced Practice Provider:  No care team member to display Electrophysiologist:  None   HPI    [name] is a 82 y.o. male with a past medical history of CVA, Barrett's esophagus, CAD status post MI and DES placement and ultimately CABG, OSA presents today for hospital follow-up appointment.  He was seen in the emergency room for evaluation of left lower extremity pain and concern for ischemia.  Patient was seen in [place] where providers at urgent care were unable to obtain a palpable pulse and sent the patient to the emergency department for further evaluation.  They recommended EMS transfer but patient refused and drove himself to the emergency department.  He was having lower extremity symptoms for about a week and endorsed severe leg pain worse at night.  He denied chest pain, shortness of breath, abdominal pain, nausea, vomiting. CT aortobifem ordered.  He was last seen by myself 12/4 and he overall was feeling well without any significant symptoms.  He was having lower leg pain that evaluated by vascular earlier today and this was thought to be due to neuropathy.  EKG since he had a regular heart block shows normal sinus rhythm PACs.  He wanted to discuss his metoprolol and why he was taking this medication. We discussed the reasons and he agreed. No chest pain or SOB.  Today, he ***  Past Medical History    Past Medical History:  Diagnosis Date   Acute ischemic stroke ([place]) 09/15/2015   Ankle fracture, left 2009   rod placed   Ankle fracture, left    rod placed   Anxiety    Aspirin allergy    a. Anaphylactic rxn.   Barrett's esophagus    CAD (coronary artery disease)    a. Inf STEMI 07/2013: s/p DES x 2 from ostial to [facility] 07/13/13. Residual Cx disease for med rx (PCI if  recurrent pain).   Controlled gout    Dyslipidemia    ED (erectile dysfunction)    Edema of both legs    Gastric polyp    GERD (gastroesophageal reflux disease)    Hiatal hernia    History of nuclear stress test    Nuclear stress test 5/19: EF 53, no ischemia, no infarction; Low Risk   Hydrocele of testis    Hyperglycemia    a. A1C 5.8 in 07/2013.   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   LIMA to LAD, SVG to Diag, Sequential SVG to OM2-OM3, SVG to RCA, EVH via right thigh and leg   Unstable angina [facility])    Past Surgical History:  Procedure Laterality Date   BACK SURGERY     L1 kyphoplasty by Dr Trenton Gammon 03/2018     thoractic 02-2018   cataract surgery      bilateral    COLONOSCOPY     CORONARY ANGIOPLASTY     stents    CORONARY ARTERY BYPASS GRAFT N/A 12/12/2018   Procedure: CORONARY ARTERY BYPASS GRAFTING (CABG);  Surgeon: Rexene Alberts, MD;  Location:  [facility] OR;  Service: Open Heart Surgery;  Laterality: N/A;   ESOPHAGOGASTRODUODENOSCOPY  04/04/2016   Short-segment Barrett's esophagus (biopsied) Gastric polyps (status post polypectomy x1) Hiatal hernia   HYDROCELE EXCISION Left 07/17/2014   Procedure: LEFT HYDROCELECTOMY;  Surgeon: Malka So, MD;  Location: [facility] ORS;  Service: Urology;  Laterality: Left;   left ankle surgery  1999   LEFT HEART CATH AND CORONARY ANGIOGRAPHY N/A 12/11/2018   Procedure: LEFT HEART CATH AND CORONARY ANGIOGRAPHY;  Surgeon: Belva Crome, MD;  Location: [facility] CV LAB;  Service: Cardiovascular;  Laterality: N/A;   LEFT HEART CATHETERIZATION WITH CORONARY ANGIOGRAM N/A 07/13/2013   Procedure: LEFT HEART CATHETERIZATION WITH CORONARY ANGIOGRAM;  Surgeon: Sinclair Grooms, MD;  Location: [facility] CATH LAB;  Service: Cardiovascular;  Laterality: N/A;    PERCUTANEOUS CORONARY STENT INTERVENTION (PCI-S)  07/13/2013   Procedure: PERCUTANEOUS CORONARY STENT INTERVENTION (PCI-S);  Surgeon: Sinclair Grooms, MD;  Location: [facility] CATH LAB;  Service: Cardiovascular;;   TEE WITHOUT CARDIOVERSION N/A 12/12/2018   Procedure: TRANSESOPHAGEAL ECHOCARDIOGRAM (TEE);  Surgeon: Rexene Alberts, MD;  Location: [facility];  Service: Open Heart Surgery;  Laterality: N/A;   TONSILLECTOMY      Allergies  Allergies  Allergen Reactions   Aspirin Hives and Shortness Of Breath    "Shortness of Breath" can't breathe   Quinine Derivatives Hives and Other (See Comments)    "looks like he has the measles"    EKGs/Labs/Other Studies Reviewed:   The following studies were reviewed today:  VAS ABIs 09/12/22  Summary:  Right: Resting right ankle-brachial index indicates noncompressible right  lower extremity arteries with biphasic waveforms.  The right great toe is non-compressible.   Left: Resting left ankle-brachial index indicates noncompressible left  lower extremity arteries with triphasic waveforms. The left toe-brachial  index is normal.   EKG:  EKG is  ordered today.  The ekg ordered today demonstrates normal sinus rhythm, rate 66 bpm with first-degree AV block and PACs  Recent Labs: 03/26/2022: Magnesium 1.7 07/06/2022: NT-Pro BNP 518 09/03/2022: Hemoglobin 12.3; Platelets 169 11/17/2022: ALT 9; BUN 25; Creatinine, Ser 1.09; Potassium 4.1; Sodium 140  Recent Lipid Panel    Component Value Date/Time   CHOL 108 03/20/2017 1331   TRIG 87 03/20/2017 1331   HDL 45 03/20/2017 1331   CHOLHDL 2.4 03/20/2017 1331   CHOLHDL 2.7 09/15/2015 0205   VLDL 22 09/15/2015 0205   LDLCALC 46 03/20/2017 1331    Home Medications   No outpatient medications have been marked as taking for the 11/18/22 encounter (Appointment) with Elgie Collard, PA-C.     Review of Systems      All other systems reviewed and are otherwise negative except as noted above.  Physical Exam     VS:  There were no vitals taken for this visit. , BMI There is no height or weight on file to calculate BMI.  Wt Readings from Last 3 Encounters:  09/16/22 163 lb 6.4 oz (74.1 kg)  09/12/22 160 lb 12.8 oz (72.9 kg)  09/12/22 160 lb (72.6 kg)     GEN: Well nourished, well developed, in no acute distress. HEENT: normal. Neck: Supple, no JVD, carotid bruits, or masses. Cardiac: irregularly irregular, no murmurs, rubs, or gallops. No clubbing, cyanosis, edema.  Radials/PT 2+ and equal bilaterally.  Respiratory:  Respirations regular and unlabored, clear to auscultation bilaterally. GI: Soft, nontender, nondistended. MS: No deformity or atrophy. Skin: Warm and dry, no rash. Neuro:  Strength  and sensation are intact. Psych: Normal affect.  Assessment & Plan    PACs -EKG performed today which showed NSR, 1st degree AV block and PACs. No Afib -continue current medications -sometimes he feels the skipped beats but most of the time he is asymptomatic  CAD s/p prior stenting and CABG 2020 -he never really had any chest pains or SOB -he was tired and run down at the time which was his anginal equivalent  -continue current medication regimen, Plavix 75 g daily, lisinopril 20 mg, Lopressor 12.5 mg twice daily -he is able to do all that he wants to do  Hypertension -well controlled today -Continue current medication regimen  Hyperlipidemia -Recent LDL was 51 -We will be due for repeat lipid panel at next visit if not already done by PCP -Continue Lipitor 80 mg daily  PAD/neuropathy -Seen by vascular today arterial disease.  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  Started on gabapentin.  No BP recorded.  {Refresh Note OR Click here to enter BP  :1}***      Disposition: Follow up 4 months with Sinclair Grooms, MD (Inactive) or APP.  Signed, Elgie Collard, PA-C 11/18/2022, 10:22 AM [place]

## 2022-11-23 ENCOUNTER — Other Ambulatory Visit: Payer: Self-pay

## 2022-12-06 ENCOUNTER — Encounter: Payer: Self-pay | Admitting: Gastroenterology

## 2022-12-08 DIAGNOSIS — M5416 Radiculopathy, lumbar region: Secondary | ICD-10-CM | POA: Diagnosis not present

## 2022-12-08 DIAGNOSIS — S22050A Wedge compression fracture of T5-T6 vertebra, initial encounter for closed fracture: Secondary | ICD-10-CM | POA: Diagnosis not present

## 2022-12-13 DIAGNOSIS — F331 Major depressive disorder, recurrent, moderate: Secondary | ICD-10-CM | POA: Diagnosis not present

## 2022-12-13 DIAGNOSIS — R630 Anorexia: Secondary | ICD-10-CM | POA: Diagnosis not present

## 2022-12-14 ENCOUNTER — Other Ambulatory Visit (HOSPITAL_COMMUNITY): Payer: Self-pay

## 2022-12-15 ENCOUNTER — Other Ambulatory Visit (HOSPITAL_COMMUNITY): Payer: Self-pay

## 2022-12-23 ENCOUNTER — Other Ambulatory Visit: Payer: Self-pay

## 2023-01-10 DIAGNOSIS — R630 Anorexia: Secondary | ICD-10-CM | POA: Diagnosis not present

## 2023-01-10 DIAGNOSIS — F331 Major depressive disorder, recurrent, moderate: Secondary | ICD-10-CM | POA: Diagnosis not present

## 2023-01-10 DIAGNOSIS — N529 Male erectile dysfunction, unspecified: Secondary | ICD-10-CM | POA: Diagnosis not present

## 2023-01-17 ENCOUNTER — Other Ambulatory Visit (HOSPITAL_COMMUNITY): Payer: Self-pay

## 2023-01-17 ENCOUNTER — Other Ambulatory Visit: Payer: Self-pay

## 2023-01-17 ENCOUNTER — Other Ambulatory Visit: Payer: Self-pay | Admitting: Pulmonary Disease

## 2023-01-17 DIAGNOSIS — J84112 Idiopathic pulmonary fibrosis: Secondary | ICD-10-CM

## 2023-01-17 MED ORDER — PIRFENIDONE 267 MG PO TABS
801.0000 mg | ORAL_TABLET | Freq: Three times a day (TID) | ORAL | 4 refills | Status: DC
  Filled 2023-01-17: qty 270, 30d supply, fill #0

## 2023-01-17 NOTE — Telephone Encounter (Signed)
Refill for pirfenidone sent to [facility]  Dose; 801mg  three times daily  LFTs on 11/17/22 wnl  Chesley Mires, PharmD, MPH, BCPS, CPP Clinical Pharmacist (Rheumatology and Pulmonology)

## 2023-01-18 ENCOUNTER — Ambulatory Visit: Payer: Medicare PPO | Admitting: Primary Care

## 2023-01-18 ENCOUNTER — Encounter: Payer: Self-pay | Admitting: Primary Care

## 2023-01-18 VITALS — BP 134/68 | HR 92 | Temp 98.4°F | Ht 68.0 in | Wt 152.2 lb

## 2023-01-18 DIAGNOSIS — L568 Other specified acute skin changes due to ultraviolet radiation: Secondary | ICD-10-CM | POA: Diagnosis not present

## 2023-01-18 DIAGNOSIS — J84112 Idiopathic pulmonary fibrosis: Secondary | ICD-10-CM

## 2023-01-18 LAB — HEPATIC FUNCTION PANEL
ALT: 9 U/L (ref 0–53)
AST: 24 U/L (ref 0–37)
Albumin: 3.8 g/dL (ref 3.5–5.2)
Alkaline Phosphatase: 90 U/L (ref 39–117)
Bilirubin, Direct: 0.1 mg/dL (ref 0.0–0.3)
Total Bilirubin: 0.3 mg/dL (ref 0.2–1.2)
Total Protein: 6.6 g/dL (ref 6.0–8.3)

## 2023-01-18 MED ORDER — PREDNISONE 10 MG PO TABS: ORAL_TABLET | ORAL | 0 refills | Status: DC

## 2023-01-18 NOTE — Assessment & Plan Note (Signed)
-   Patient is on Esbriet for IPF. He has been on medication for 6 months without issue. He developed rash to sun exposed skin 2-4 weeks ago. Breathing is currently stable. We will have patient hold Esbriet for 2 weeks, treat with short course of oral prednisone and then re-trial medication. Checking hepatic function today.

## 2023-01-18 NOTE — Progress Notes (Signed)
@Patient  ID: Matthew Liu, male    DOB: 1940-11-30, 82 y.o.   MRN: [id number]  Chief Complaint  Patient presents with   Acute Visit    Skin red, peeling and dry on hands, face and back.  Sx x 2 weeks.  Poss medication allergy    Referring provider: Paulina Fusi, MD  HPI: 82 y.o.  with history of hypertension, coronary artery disease, hyperlipidemia, allergies, GERD  He had an abnormal x-ray at his primary care with a follow-up CT showing UIP pattern pulmonary fibrosis at [facility] and has been referred here for further evaluation Complains of mild dyspnea on exertion.  No cough or fevers or chills  He is also being worked up for sleep apnea.  A previous sleep study was nondiagnostic as he did not sleep and he has a repeat in lab sleep study on 08/27/2021.  Subsequently started on CPAP  Pets: No pets Occupation: Retired Civil engineer, contracting Exposures: No mold, hot tub, Financial controller.  No feather pillows or comforter ILD questionnaire 08/26/2021-negative Smoking history: Never smoker Travel history: No significant travel history Relevant family history: No family history of lung disease  Previous [facility] pulmonary encounter: 09/16/22- Dr. Isaiah Serge  Started on Ofev in February 2023 This was poorly tolerated with significant fatigue, diarrhea.  He was eventually treated in the ED in 03/24/2022 with labs showing elevated creatinine.  He was given IV fluids and discharged the next day.  His follow-up creatinine has normalized  He was taken off Ofev and started on Esbriet in October 2023.  He has had some initial difficulties with the medication but is tolerating it better now.  No new complaints.   01/18/2023- Interim hx  Patient presents today for acute visit due to rash. He developed skin rash to his hands 2-4 weeks ago. His hands were swollen. The skin to the back of his hands is currently dry and peeling. He has been on pirfenidone for approximately 6 months. He reports wearing sunscreen, hat  and long sleeves when outdoors. He has not been out in the sun more than usual. When he drives his hands are usually in the sun. He has no worsening shortness of breath symptoms.    Allergies  Allergen Reactions   Aspirin Hives and Shortness Of Breath    "Shortness of Breath" can't breathe   Quinine Derivatives Hives and Other (See Comments)    "looks like he has the measles"    Immunization History  Administered Date(s) Administered   Influenza-Unspecified 06/10/2021   Moderna Sars-Covid-2 Vaccination 11/05/2019    Past Medical History:  Diagnosis Date   Acute ischemic stroke 09/15/2015   Ankle fracture, left 2009   rod placed   Ankle fracture, left    rod placed   Anxiety    Aspirin allergy    a. Anaphylactic rxn.   Barrett's esophagus    CAD (coronary artery disease)    a. Inf STEMI 07/2013: s/p DES x 2 from ostial to [facility] 07/13/13. Residual Cx disease for med rx (PCI if recurrent pain).   Controlled gout    Dyslipidemia    ED (erectile dysfunction)    Edema of both legs    Gastric polyp    GERD (gastroesophageal reflux disease)    Hiatal hernia    History of nuclear stress test    Nuclear stress test 5/19: EF 53, no ischemia, no infarction; Low Risk   Hydrocele of testis    Hyperglycemia    a. A1C 5.8 in 07/2013.  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 30 tablet 0   ALPRAZolam (XANAX) 0.25 MG tablet Take 0.25 mg by mouth 2 (two) times daily as needed for anxiety.      atorvastatin (LIPITOR) 80 MG tablet Take 1 tablet (80 mg total) by mouth every evening. 90 tablet 3   carbidopa-levodopa (SINEMET IR) 25-100 MG tablet Take 1 tablet by mouth 3 (three) times daily. 270 tablet 1   Cholecalciferol (VITAMIN D3) 50 MCG (2000 UT) capsule Take 2,000 Units by mouth daily.     clopidogrel (PLAVIX) 75 MG tablet TAKE 1 TABLET BY MOUTH EVERY DAY (Patient taking differently: Take 75 mg by mouth daily.) 90 tablet 2   FLUoxetine (PROZAC) 20 MG capsule Take 20 mg by mouth daily.     hydrochlorothiazide (MICROZIDE) 12.5 MG capsule TAKE 1 CAPSULE BY MOUTH EVERY DAY 90 capsule 3   lisinopril (ZESTRIL) 40 MG tablet Take 0.5 tablets (20 mg total) by mouth at bedtime.     mirtazapine (REMERON) 7.5 MG tablet Take 7.5 mg by mouth at bedtime.     Multiple Vitamins-Minerals (ONE-A-DAY MENS 50+) TABS Take 1 tablet by mouth daily with breakfast.     ondansetron (ZOFRAN) 4 MG tablet Take 1 tablet (4 mg total) by mouth every 6 (six) hours as needed for nausea or vomiting. 30 tablet 5   pantoprazole (PROTONIX) 40 MG tablet TAKE 1 TABLET (40 MG TOTAL) BY MOUTH DAILY. PLEASE CALL [phone] TO SCHEDULE AN OFFICE VISIT FOR MORE REFILLS 90 tablet 0   Pirfenidone (ESBRIET) 267 MG TABS Take 3 tablets (801 mg total) by mouth 3 (three) times daily with meals. 270 tablet 4   gabapentin (NEURONTIN) 300 MG capsule Take 1 capsule (300 mg total) by mouth daily. (Patient not taking: Reported on 01/18/2023) 30 capsule 1   methocarbamol (ROBAXIN) 750 MG tablet Take 750 mg by mouth 2 (two) times daily as needed. (Patient not taking: Reported on 01/18/2023)     metoprolol tartrate (LOPRESSOR) 25 MG tablet Take 0.5 tablets (12.5 mg total) by mouth 2 (two) times daily. (Patient not taking: Reported on 01/18/2023) 90 tablet 3   ondansetron (ZOFRAN-ODT) 4 MG  disintegrating tablet Take 4 mg by mouth 4 (four) times daily as needed. (Patient not taking: Reported on 01/18/2023)     No facility-administered medications prior to visit.    Review of Systems  Review of Systems   Physical Exam  BP 134/68 (BP Location: Right Arm, Patient Position: Sitting, Cuff Size: Normal)   Pulse 92   Temp 98.4 F (36.9 C) (Oral)   Ht 5\' 8"  (1.727 m)   Wt 152 lb 3.2 oz (69 kg)   SpO2 94%   BMI 23.14 kg/m  Physical Exam Constitutional:  General: He is not in acute distress.    Appearance: Normal appearance. He is not ill-appearing.  Cardiovascular:     Rate and Rhythm: Normal rate and regular rhythm.  Pulmonary:     Effort: Pulmonary effort is normal.     Breath sounds: Rales present.  Skin:    Comments: Red dry/peeling rash to backs of hands and neck   Neurological:     General: No focal deficit present.     Mental Status: He is alert and oriented to person, place, and time. Mental status is at baseline.  Psychiatric:        Mood and Affect: Mood normal.        Behavior: Behavior normal.        Thought Content: Thought content normal.        Judgment: Judgment normal.      Lab Results:  CBC    Component Value Date/Time   WBC 6.8 09/03/2022 2230   RBC 3.62 (L) 09/03/2022 2230   HGB 12.3 (L) 09/03/2022 2230   HGB 12.4 (L) 09/02/2020 0844   HCT 36.2 (L) 09/03/2022 2230   HCT 35.2 (L) 09/02/2020 0844   PLT 169 09/03/2022 2230   PLT 157 09/02/2020 0844   MCV 100.0 09/03/2022 2230   MCV 101 (H) 09/02/2020 0844   MCH 34.0 09/03/2022 2230   MCHC 34.0 09/03/2022 2230   RDW 13.3 09/03/2022 2230   RDW 12.8 09/02/2020 0844   LYMPHSABS 2.2 09/03/2022 2230   MONOABS 0.6 09/03/2022 2230   EOSABS 0.2 09/03/2022 2230   BASOSABS 0.0 09/03/2022 2230    BMET    Component Value Date/Time   NA 140 11/17/2022 1217   NA 138 09/02/2020 0844   K 4.1 11/17/2022 1217   CL 104 11/17/2022 1217   CO2 28 11/17/2022 1217   GLUCOSE 94 11/17/2022  1217   BUN 25 (H) 11/17/2022 1217   BUN 20 09/02/2020 0844   CREATININE 1.09 11/17/2022 1217   CALCIUM 9.1 11/17/2022 1217   GFRNONAA 36 (L) 09/03/2022 2230   GFRAA 81 09/02/2020 0844    BNP No results found for: "BNP"  ProBNP    Component Value Date/Time   PROBNP 518 (H) 07/06/2022 1639    Imaging: No results found.   Assessment & Plan:   Photosensitivity dermatitis - Patient developed rash to the back of his neck and back of his hands 2-4 weeks ago. Skin is red, dry and peeling. Rash most likely due to Esbriet. Theres a 30% rash/ 10% photosensitivity with pirfenidone. We will do a short course of oral prednisone. Recommend patient use Aquaphor to affected skin. Advised patient stop Esbriet for 2 weeks, we will then consider retrying at follow-up and if rash returns may need to discontinue and consider clinical trials for treatment of pulmonary fibrosis.   IPF (idiopathic pulmonary fibrosis) (HCC) - Patient is on Esbriet for IPF. He has been on medication for 6 months without issue. He developed rash to sun exposed skin 2-4 weeks ago. Breathing is currently stable. We will have patient hold Esbriet for 2 weeks, treat with short course of oral prednisone and then re-trial medication. Checking hepatic function today.   Glenford Bayley, NP 01/18/2023

## 2023-01-18 NOTE — Patient Instructions (Addendum)
Recommendations: - Stop Esbriet for 2 weeks; we will then consider retrying at follow-up and if rash returns may need to discontinue and consider clinical trials for treatment of pulmonary fibrosis  - Take prednisone taper as directed - Use Aquaphor ointment to affected skin  - Avoid direct sunlight to your skin / cover skin and use sunscreen if outdoors   Orders: - Labs today   Follow-up: - 2 weeks with Dr. Isaiah Serge or Waynetta Sandy NP

## 2023-01-18 NOTE — Assessment & Plan Note (Addendum)
-   Patient developed rash to the back of his neck and back of his hands 2-4 weeks ago. Skin is red, dry and peeling. Rash most likely due to Esbriet. Theres a 30% rash/ 10% photosensitivity with pirfenidone. We will do a short course of oral prednisone. Recommend patient use Aquaphor to affected skin. Advised patient stop Esbriet for 2 weeks, we will then consider retrying at follow-up and if rash returns may need to discontinue and consider clinical trials for treatment of pulmonary fibrosis.

## 2023-01-20 ENCOUNTER — Other Ambulatory Visit (HOSPITAL_COMMUNITY): Payer: Self-pay

## 2023-01-26 ENCOUNTER — Ambulatory Visit: Payer: Medicare PPO | Admitting: Physician Assistant

## 2023-01-26 ENCOUNTER — Encounter: Payer: Self-pay | Admitting: Physician Assistant

## 2023-01-26 VITALS — BP 149/68 | HR 67 | Resp 18 | Ht 68.0 in | Wt 153.0 lb

## 2023-01-26 DIAGNOSIS — G20A1 Parkinson's disease without dyskinesia, without mention of fluctuations: Secondary | ICD-10-CM | POA: Diagnosis not present

## 2023-01-26 DIAGNOSIS — F0670 Mild neurocognitive disorder due to known physiological condition without behavioral disturbance: Secondary | ICD-10-CM | POA: Diagnosis not present

## 2023-01-26 NOTE — Patient Instructions (Addendum)
It was a pleasure to see you today at our office. Everything is stable no new findings  ? ?Recommendations: ? ?Follow up in  6 months ?Continue Carbidopa Levodopa as prescribed, no changes ? Continue with your other medicines  ?Use your hearing aids!!! ? ? ? ?RECOMMENDATIONS FOR ALL PATIENTS WITH MEMORY PROBLEMS: ?1. Continue to exercise (Recommend 30 minutes of walking everyday, or 3 hours every week) ?2. Increase social interactions - continue going to Church and enjoy social gatherings with friends and family ?3. Eat healthy, avoid fried foods and eat more fruits and vegetables ?4. Maintain adequate blood pressure, blood sugar, and blood cholesterol level. Reducing the risk of stroke and cardiovascular disease also helps promoting better memory. ?5. Avoid stressful situations. Live a simple life and avoid aggravations. Organize your time and prepare for the next day in anticipation. ?6. Sleep well, avoid any interruptions of sleep and avoid any distractions in the bedroom that may interfere with adequate sleep quality ?7. Avoid sugar, avoid sweets as there is a strong link between excessive sugar intake, diabetes, and cognitive impairment ?We discussed the Mediterranean diet, which has been shown to help patients reduce the risk of progressive memory disorders and reduces cardiovascular risk. This includes eating fish, eat fruits and green leafy vegetables, nuts like almonds and hazelnuts, walnuts, and also use olive oil. Avoid fast foods and fried foods as much as possible. Avoid sweets and sugar as sugar use has been linked to worsening of memory function. ? ?There is always a concern of gradual progression of memory problems. If this is the case, then we may need to adjust level of care according to patient needs. Support, both to the patient and caregiver, should then be put into place.  ? ? ?FALL PRECAUTIONS: Be cautious when walking. Scan the area for obstacles that may increase the risk of trips and falls.  When getting up in the mornings, sit up at the edge of the bed for a few minutes before getting out of bed. Consider elevating the bed at the head end to avoid drop of blood pressure when getting up. Walk always in a well-lit room (use night lights in the walls). Avoid area rugs or power cords from appliances in the middle of the walkways. Use a walker or a cane if necessary and consider physical therapy for balance exercise. Get your eyesight checked regularly. ? ?FINANCIAL OVERSIGHT: Supervision, especially oversight when making financial decisions or transactions is also recommended. ? ?HOME SAFETY: Consider the safety of the kitchen when operating appliances like stoves, microwave oven, and blender. Consider having supervision and share cooking responsibilities until no longer able to participate in those. Accidents with firearms and other hazards in the house should be identified and addressed as well. ? ? ?ABILITY TO BE LEFT ALONE: If patient is unable to contact 911 operator, consider using [employer], or when the need is there, arrange for someone to stay with patients. Smoking is a fire hazard, consider supervision or cessation. Risk of wandering should be assessed by caregiver and if detected at any point, supervision and safe proof recommendations should be instituted. ? ?MEDICATION SUPERVISION: Inability to self-administer medication needs to be constantly addressed. Implement a mechanism to ensure safe administration of the medications. ? ? ?DRIVING: Regarding driving, in patients with progressive memory problems, driving will be impaired. We advise to have someone else do the driving if trouble finding directions or if minor accidents are reported. Independent driving assessment is available to determine safety of driving. ? ? ?  If you are interested in the driving assessment, you can contact the following: ? ?[employer] in [place] [phone] ? ?[facility]  [phone] ? ?[facility] [phone] ? ?[facility] [phone] or [phone] ?  ?

## 2023-01-26 NOTE — Progress Notes (Signed)
Assessment/Plan:    Mild Cognitive Impairment   Matthew Liu is a very pleasant 82 y.o. 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 Patient is not on antidementia medications at this time. MMSE today is 29/30, stable. Able to participate on his ADLs without difficulties, he continues to drive without any issues      Recommendations:   Follow up in  6 months. Continue to control mood as per PCP Continue to control cardiovascular risk factors.  Continue Plavix daily Recommend OSA follow-up, the patient is not on CPAP at this time Recommend using his hearing aids For Parkinson's disease, continue carbidopa levodopa 25-100 mg 3 times daily, side effects discussed    Subjective:   This patient is here alone.  Previous records as well as any outside records available were reviewed prior to todays visit.   Patient was last seen on 07/27/2022     Any changes in memory since last visit? No changes since the last visit.  He is able to remember recent conversations and people names without any issues.  repeats oneself?  Endorsed Disoriented when walking into a room?  Patient denies Leaving objects in unusual places?  Patient denies   Wandering behavior?   denies   Any personality changes since last visit?   denies   Any worsening depression?: denies   Hallucinations or paranoia?  denies   Seizures?   denies    Any sleep changes?  Sleeps well . Denies  vivid dreams, REM behavior or sleepwalking   Sleep apnea?   denies   Any hygiene concerns?   denies   Independent of bathing and dressing?  Endorsed  Does the patient needs help with medications?  Patient is in charge  Who is in charge of the finances?  Patient is in charge    Any changes in appetite?  denies     Patient have trouble swallowing?  denies   Does the patient cook? Yes  Any kitchen accidents such as leaving the stove on?   denies   Any headaches?    denies   Vision changes? denies Chronic back pain  denies   Ambulates with difficulty?    denies   Recent falls or head injuries?    denies     Unilateral weakness, numbness or tingling?   denies   Any tremors?  About the same, worse if nervous, denies dropping objects. No significant drooling . R>L .  Any anosmia?    denies   Any incontinence of urine? "Learned to drink a lot of water and then have to go to the bathroom" Any bowel dysfunction?  denies      Patient lives  wife  Does the patient drive?yes, no issues   Past Medical History:  Diagnosis Date   Acute ischemic stroke 09/15/2015   Ankle fracture, left 2009   rod placed   Ankle fracture, left    rod placed   Anxiety    Aspirin allergy    a. Anaphylactic rxn.   Barrett's esophagus    CAD (coronary artery disease)    a. Inf STEMI 07/2013: s/p DES x 2 from ostial to [facility] 07/13/13. Residual Cx disease for med rx (PCI if recurrent pain).   Controlled gout  Dyslipidemia    ED (erectile dysfunction)    Edema of both legs    Gastric polyp    GERD (gastroesophageal reflux disease)    Hiatal hernia    History of nuclear stress test    Nuclear stress test 5/19: EF 53, no ischemia, no infarction; Low Risk   Hydrocele of testis    Hyperglycemia    a. A1C 5.8 in 07/2013.   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   OSA (obstructive sleep apnea)    Severe obstructive sleep apnea with an AHI of 40.4/h on auto CPAP   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   LIMA to LAD, SVG to Diag, Sequential SVG to OM2-OM3, SVG to RCA, EVH via right thigh and leg   Unstable angina      Past Surgical History:  Procedure Laterality Date   BACK SURGERY     L1 kyphoplasty by Dr Dutch Quint 03/2018     thoractic 02-2018   cataract surgery       bilateral    COLONOSCOPY     CORONARY ANGIOPLASTY     stents    CORONARY ARTERY BYPASS GRAFT N/A 12/12/2018   Procedure: CORONARY ARTERY BYPASS GRAFTING (CABG);  Surgeon: Purcell Nails, MD;  Location: [facility] OR;  Service: Open Heart Surgery;  Laterality: N/A;   ESOPHAGOGASTRODUODENOSCOPY  04/04/2016   Short-segment Barrett's esophagus (biopsied) Gastric polyps (status post polypectomy x1) Hiatal hernia   HYDROCELE EXCISION Left 07/17/2014   Procedure: LEFT HYDROCELECTOMY;  Surgeon: Anner Crete, MD;  Location: [facility] ORS;  Service: Urology;  Laterality: Left;   left ankle surgery  1999   LEFT HEART CATH AND CORONARY ANGIOGRAPHY N/A 12/11/2018   Procedure: LEFT HEART CATH AND CORONARY ANGIOGRAPHY;  Surgeon: Lyn Records, MD;  Location: [facility] INVASIVE CV LAB;  Service: Cardiovascular;  Laterality: N/A;   LEFT HEART CATHETERIZATION WITH CORONARY ANGIOGRAM N/A 07/13/2013   Procedure: LEFT HEART CATHETERIZATION WITH CORONARY ANGIOGRAM;  Surgeon: Lesleigh Noe, MD;  Location: [facility] CATH LAB;  Service: Cardiovascular;  Laterality: N/A;   PERCUTANEOUS CORONARY STENT INTERVENTION (PCI-S)  07/13/2013   Procedure: PERCUTANEOUS CORONARY STENT INTERVENTION (PCI-S);  Surgeon: Lesleigh Noe, MD;  Location: [facility] CATH LAB;  Service: Cardiovascular;;   TEE WITHOUT CARDIOVERSION N/A 12/12/2018   Procedure: TRANSESOPHAGEAL ECHOCARDIOGRAM (TEE);  Surgeon: Purcell Nails, MD;  Location: [facility] OR;  Service: Open Heart Surgery;  Laterality: N/A;   TONSILLECTOMY       PREVIOUS MEDICATIONS:   CURRENT MEDICATIONS:  Outpatient Encounter Medications as of 01/26/2023  Medication Sig   acetaminophen (TYLENOL) 500 MG tablet Take 1 tablet (500 mg total) by mouth every 6 (six) hours as needed for mild pain or headache.   ALPRAZolam (XANAX) 0.25 MG tablet Take 0.25 mg by mouth 2 (two) times daily as needed for anxiety.    atorvastatin (LIPITOR) 80 MG tablet Take 1 tablet (80 mg total) by mouth every evening.   carbidopa-levodopa  (SINEMET IR) 25-100 MG tablet Take 1 tablet by mouth 3 (three) times daily.   Cholecalciferol (VITAMIN D3) 50 MCG (2000 UT) capsule Take 2,000 Units by mouth daily.   clopidogrel (PLAVIX) 75 MG tablet TAKE 1 TABLET BY MOUTH EVERY DAY (Patient taking differently: Take 75 mg by mouth daily.)   FLUoxetine (PROZAC) 20 MG capsule Take 20 mg by mouth daily.   gabapentin (NEURONTIN) 300 MG capsule Take 1 capsule (300 mg total)  by mouth daily.   hydrochlorothiazide (MICROZIDE) 12.5 MG capsule TAKE 1 CAPSULE BY MOUTH EVERY DAY   lisinopril (ZESTRIL) 40 MG tablet Take 0.5 tablets (20 mg total) by mouth at bedtime.   methocarbamol (ROBAXIN) 750 MG tablet Take 750 mg by mouth 2 (two) times daily as needed.   metoprolol tartrate (LOPRESSOR) 25 MG tablet Take 0.5 tablets (12.5 mg total) by mouth 2 (two) times daily.   mirtazapine (REMERON) 7.5 MG tablet Take 7.5 mg by mouth at bedtime.   Multiple Vitamins-Minerals (ONE-A-DAY MENS 50+) TABS Take 1 tablet by mouth daily with breakfast.   ondansetron (ZOFRAN) 4 MG tablet Take 1 tablet (4 mg total) by mouth every 6 (six) hours as needed for nausea or vomiting.   ondansetron (ZOFRAN-ODT) 4 MG disintegrating tablet Take 4 mg by mouth 4 (four) times daily as needed.   pantoprazole (PROTONIX) 40 MG tablet TAKE 1 TABLET (40 MG TOTAL) BY MOUTH DAILY. PLEASE CALL [phone] TO SCHEDULE AN OFFICE VISIT FOR MORE REFILLS   Pirfenidone (ESBRIET) 267 MG TABS Take 3 tablets (801 mg total) by mouth 3 (three) times daily with meals.   predniSONE (DELTASONE) 10 MG tablet Take 40mg  x 5 days; 20mg  x 5 days; then stop   No facility-administered encounter medications on file as of 01/26/2023.     Objective:     PHYSICAL EXAMINATION:    VITALS:   Vitals:   01/26/23 1516  BP: (!) 149/68  Pulse: 67  Resp: 18  SpO2: 97%  Weight: 153 lb (69.4 kg)  Height: 5\' 8"  (1.727 m)    GEN:  The patient appears stated age and is in NAD. HEENT:  Normocephalic, atraumatic.    Neurological examination:  General: NAD, well-groomed, appears stated age. Orientation: The patient is alert. Oriented to person, place and date Cranial nerves: There is good facial symmetry. hypomimia noted.  The speech is fluent and clear, tangential at times, mild hypophonia. Reads lips due to decreased hearing. No aphasia or dysarthria. Fund of knowledge is appropriate. Recent memory impaired and remote memory is normal.  Attention and concentration are normal.  Able to name objects and repeat phrases.  Hearing is reduced to conversational tone . Delayed recall 3/3   Sensation: Sensation is intact to light touch throughout Motor: Strength is at least antigravity x4. DTR's 2/4 in UE/LE      03/18/2021    3:00 PM  Montreal Cognitive Assessment   Visuospatial/ Executive (0/5) 3  Naming (0/3) 3  Attention: Read list of digits (0/2) 2  Attention: Read list of letters (0/1) 1  Attention: Serial 7 subtraction starting at 100 (0/3) 3  Language: Repeat phrase (0/2) 1  Language : Fluency (0/1) 0  Abstraction (0/2) 0  Delayed Recall (0/5) 3  Orientation (0/6) 5  Total 21  Adjusted Score (based on education) 22       01/26/2023    3:00 PM 01/25/2022    8:00 PM  MMSE - Mini Mental State Exam  Orientation to time 4 5  Orientation to Place 5 5  Registration 3 3  Attention/ Calculation 5 5  Recall 3 3  Language- name 2 objects 2 2  Language- repeat 1 1  Language- follow 3 step command 3 3  Language- read & follow direction 1 1  Write a sentence 1 1  Copy design 1 1  Total score 29 30       Movement examination: Tone: There is right greater than left increased tone in the  upper and lower extremities.  Bilateral left greater than right cogwheeling Abnormal movements: .  No myoclonus.  No asterixis.   Coordination:  There is no decremation with RAM's. Normal finger to nose  Gait and Station: The patient has no difficulty arising out of a deep-seated chair without the use of the  hands. The patient's stride length is good.  Gait is cautious and slightly wide-based.  Right arm decreased swing. Thank you for allowing [place] the opportunity to participate in the care of this nice patient. Please do not hesitate to contact us for any questions or concerns.   Total time spent on today's visit was 30 minutes dedicated to this patient today, preparing to see patient, examining the patient, ordering tests and/or medications and counseling the patient, documenting clinical information in the EHR or other health record, independently interpreting results and communicating results to the patient/family, discussing treatment and goals, answering patient's questions and coordinating care.  Cc:  Paulina Fusi, MD  Marlowe Kays 01/26/2023 3:30 PM

## 2023-01-28 ENCOUNTER — Other Ambulatory Visit: Payer: Self-pay | Admitting: Physician Assistant

## 2023-01-29 ENCOUNTER — Other Ambulatory Visit: Payer: Self-pay | Admitting: Gastroenterology

## 2023-02-01 ENCOUNTER — Ambulatory Visit: Payer: Medicare PPO | Admitting: Primary Care

## 2023-02-01 ENCOUNTER — Encounter: Payer: Self-pay | Admitting: Primary Care

## 2023-02-01 VITALS — BP 130/68 | HR 85 | Temp 98.7°F | Ht 68.0 in | Wt 151.2 lb

## 2023-02-01 DIAGNOSIS — J84112 Idiopathic pulmonary fibrosis: Secondary | ICD-10-CM | POA: Diagnosis not present

## 2023-02-01 DIAGNOSIS — L568 Other specified acute skin changes due to ultraviolet radiation: Secondary | ICD-10-CM | POA: Diagnosis not present

## 2023-02-01 NOTE — Assessment & Plan Note (Addendum)
-   Patient develop photosensitivity dermatitis to Esbriet after being on medication for 6 months. He was given 2 week drug holiday and prednisone taper. Rash has significantly improved. I will discuss re-trying antifibrotic with Dr. Isaiah Serge. Advised he continue to wear sunscreen daily when out in the sun and continue to use Aquaphor to dry skin (hands and back). Follow-up in 6 weeks with Dr. Isaiah Serge

## 2023-02-01 NOTE — Assessment & Plan Note (Addendum)
-   No acute or worsening respiratory symptoms  - Esbriet on hold due to drug reaction  - CMET normal in April

## 2023-02-01 NOTE — Progress Notes (Signed)
  ID: Matthew Liu, male    DOB: August 20, 1941, 82 y.o.   MRN: [id number]  Chief Complaint  Patient presents with   Follow-up    Some improvement since d/c pirfenidone .     Referring provider: Paulina Fusi, MD  HPI: 82 y.o. with history of hypertension, coronary artery disease, hyperlipidemia, allergies, GERD  He had an abnormal x-ray at his primary care with a follow-up CT showing UIP pattern pulmonary fibrosis at [facility] and has been referred here for further evaluation Complains of mild dyspnea on exertion.  No cough or fevers or chills  He is also being worked up for sleep apnea.  A previous sleep study was nondiagnostic as he did not sleep and he has a repeat in lab sleep study on 08/27/2021.  Subsequently started on CPAP  Pets: No pets Occupation: Retired Civil engineer, contracting Exposures: No mold, hot tub, Financial controller.  No feather pillows or comforter ILD questionnaire 08/26/2021-negative Smoking history: Never smoker Travel history: No significant travel history Relevant family history: No family history of lung disease  Previous [facility] pulmonary encounter: 09/16/22- Dr. Isaiah Serge  Started on Ofev in February 2023 This was poorly tolerated with significant fatigue, diarrhea.  He was eventually treated in the ED in 03/24/2022 with labs showing elevated creatinine.  He was given IV fluids and discharged the next day.  His follow-up creatinine has normalized  He was taken off Ofev and started on Esbriet in October 2023.  He has had some initial difficulties with the medication but is tolerating it better now.  No new complaints.   01/18/2023 Patient presents today for acute visit due to rash. He developed skin rash to his hands 2-4 weeks ago. His hands were swollen. The skin to the back of his hands is currently dry and peeling. He has been on pirfenidone for approximately 6 months. He reports wearing sunscreen, hat and long sleeves when outdoors. He has not been out in the sun  more than usual. When he drives his hands are usually in the sun. He has no worsening shortness of breath symptoms.   02/01/2023- Interim hx  Patient presents today for 2 week follow-up.  Patient was seen 2 weeks ago for medication reaction to Esbriet.  He developed photosensitivity after being on medication for 6 months.  He was noted to have a rash to sun exposed skin clearing back of his neck and hands.  Esbriet was stopped for 2 weeks and he was given a short course of oral prednisone.  Skin rash has improved. He has mild erythema to back of hands. No open areas.  He completed course of prednisone CMET was normal on 01/18/23  He has not been in the sun recently. He is wearing sunscreen  No trouble breathing   Allergies  Allergen Reactions   Aspirin Hives and Shortness Of Breath    "Shortness of Breath" can't breathe   Quinine Derivatives Hives and Other (See Comments)    "looks like he has the measles"    Immunization History  Administered Date(s) Administered   Influenza-Unspecified 06/10/2021   Moderna Sars-Covid-2 Vaccination 11/05/2019    Past Medical History:  Diagnosis Date   Acute ischemic stroke 09/15/2015   Ankle fracture, left 2009   rod placed   Ankle fracture, left    rod placed   Anxiety    Aspirin allergy    a. Anaphylactic rxn.   Barrett's esophagus    CAD (coronary artery disease)    a. Inf STEMI  07/2013: s/p DES x 2 from ostial to [facility] 07/13/13. Residual Cx disease for med rx (PCI if recurrent pain).   Controlled gout    Dyslipidemia    ED (erectile dysfunction)    Edema of both legs    Gastric polyp    GERD (gastroesophageal reflux disease)    Hiatal hernia    History of nuclear stress test    Nuclear stress test 5/19: EF 53, no ischemia, no infarction; Low Risk   Hydrocele of testis    Hyperglycemia    a. A1C 5.8 in 07/2013.   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 30 tablet 0   ALPRAZolam (XANAX) 0.25 MG tablet Take 0.25 mg by mouth 2 (two) times daily as needed for anxiety.      atorvastatin (LIPITOR) 80 MG tablet Take 1 tablet (80 mg total) by mouth every evening. 90 tablet 3   carbidopa-levodopa (SINEMET IR) 25-100 MG tablet TAKE 1 TABLET BY MOUTH THREE TIMES A DAY 270 tablet 1   Cholecalciferol (VITAMIN D3) 50 MCG (2000 UT) capsule Take 2,000 Units by mouth daily.     clopidogrel (PLAVIX) 75 MG tablet TAKE 1 TABLET BY MOUTH EVERY DAY (Patient taking differently: Take 75 mg by mouth daily.) 90 tablet 2   FLUoxetine (PROZAC) 20 MG capsule Take 20 mg by mouth daily.     gabapentin (NEURONTIN) 300 MG capsule Take 1 capsule (300 mg total) by mouth daily. 30 capsule 1   hydrochlorothiazide (MICROZIDE) 12.5 MG capsule TAKE 1 CAPSULE BY MOUTH EVERY DAY 90 capsule 3   lisinopril (ZESTRIL) 40 MG tablet Take 0.5 tablets (20 mg total) by mouth at bedtime.     methocarbamol (ROBAXIN) 750 MG tablet Take 750 mg by mouth 2 (two) times daily as needed.     metoprolol tartrate (LOPRESSOR) 25 MG tablet Take 0.5 tablets (12.5 mg total) by mouth 2 (two) times daily. 90 tablet 3   mirtazapine (REMERON) 7.5 MG tablet Take 7.5 mg by mouth at bedtime.     Multiple Vitamins-Minerals (ONE-A-DAY MENS 50+) TABS Take 1 tablet by mouth daily with breakfast.     ondansetron (ZOFRAN) 4 MG tablet Take 1 tablet (4  mg total) by mouth every 6 (six) hours as needed for nausea or vomiting. 30 tablet 5   ondansetron (ZOFRAN-ODT) 4 MG disintegrating tablet Take 4 mg by mouth 4 (four) times daily as needed.     pantoprazole (PROTONIX) 40 MG tablet TAKE 1 TABLET (40 MG TOTAL) BY MOUTH DAILY. PLEASE CALL [phone] TO SCHEDULE AN OFFICE VISIT FOR MORE REFILLS 90 tablet 0   Pirfenidone (ESBRIET) 267 MG TABS Take 3 tablets (801 mg total) by mouth 3 (three) times daily with meals. (Patient not taking: Reported on  02/01/2023) 270 tablet 4   predniSONE (DELTASONE) 10 MG tablet Take  x 5 days;  x 5 days; then stop (Patient not taking: Reported on 02/01/2023) 30 tablet 0   No facility-administered medications prior to visit.    Review of Systems  Review of Systems  Constitutional: Negative.   HENT: Negative.    Respiratory: Negative.    Cardiovascular: Negative.   Skin:  Positive for rash.    Physical Exam  BP 130/68 (BP Location: Right Arm, Patient Position: Sitting, Cuff Size: Normal)   Pulse 85   Temp 98.7 F (37.1 C) (Oral)   Ht  (1.727 m)   Wt 151 lb 3.2 oz (68.6 kg)   SpO2 97%   BMI 22.99 kg/m  Physical Exam Constitutional:      General: He is not in acute distress.    Appearance: Normal appearance. He is not ill-appearing.  HENT:     Head: Normocephalic and atraumatic.  Cardiovascular:     Rate and Rhythm: Normal rate and regular rhythm.  Pulmonary:     Effort: Pulmonary effort is normal.     Breath sounds: Rales present.  Musculoskeletal:        General: Normal range of motion.  Skin:    Comments: Rash nearly resolved to backs of hands and neck. Mild residual erythema, no dry or open areas.   Neurological:     General: No focal deficit present.     Mental Status: He is alert and oriented to person, place, and time.  Psychiatric:        Mood and Affect: Mood normal.        Behavior: Behavior normal.        Thought Content: Thought content normal.        Judgment:  Judgment normal.      Lab Results:  CBC    Component Value Date/Time   WBC 6.8 09/03/2022 2230   RBC 3.62 (L) 09/03/2022 2230   HGB 12.3 (L) 09/03/2022 2230   HGB 12.4 (L) 09/02/2020 0844   HCT 36.2 (L) 09/03/2022 2230   HCT 35.2 (L) 09/02/2020 0844   PLT 169 09/03/2022 2230   PLT 157 09/02/2020 0844   MCV 100.0 09/03/2022 2230   MCV 101 (H) 09/02/2020 0844   MCH 34.0 09/03/2022 2230   MCHC 34.0 09/03/2022 2230   RDW 13.3 09/03/2022 2230   RDW 12.8 09/02/2020 0844   LYMPHSABS 2.2 09/03/2022 2230   MONOABS 0.6 09/03/2022 2230   EOSABS 0.2 09/03/2022 2230   BASOSABS 0.0 09/03/2022 2230    BMET    Component Value Date/Time   NA 140 11/17/2022 1217   NA 138 09/02/2020 0844   K 4.1 11/17/2022 1217   CL 104 11/17/2022 1217   CO2 28 11/17/2022 1217   GLUCOSE 94 11/17/2022 1217   BUN 25 (H) 11/17/2022 1217   BUN 20 09/02/2020 0844   CREATININE 1.09 11/17/2022 1217   CALCIUM 9.1 11/17/2022 1217   GFRNONAA 36 (L) 09/03/2022 2230   GFRAA 81 09/02/2020 0844    BNP No results found for: "BNP"  ProBNP    Component Value Date/Time   PROBNP 518 (H) 07/06/2022 1639    Imaging: No results found.   Assessment & Plan:   IPF (idiopathic pulmonary fibrosis) (HCC) - No acute or worsening respiratory symptoms  - Esbriet on hold due to drug reaction  - CMET normal in April   Photosensitivity dermatitis - Patient develop photosensitivity dermatitis to Esbriet after being  on medication for 6 months. He was given 2 week drug holiday and prednisone taper. Rash has significantly improved. I will discuss re-trying antifibrotic with Dr. Isaiah Serge. Advised he continue to wear sunscreen daily when out in the sun and continue to use Aquaphor to dry skin (hands and back). Follow-up in 6 weeks with Dr. Markus Daft, NP 02/01/2023

## 2023-02-01 NOTE — Patient Instructions (Addendum)
Rash has greatly improved after prednisone (steroid) course and holding anti-fibrotic for 2 weeks   Recommendations: - We will likely resume ESBRIET - once I speak with Dr. Isaiah Serge I will let you know  - Wear sunscreen daily when out in the sun  - Continue to use Aquaphor to dry skin (hands and back)  Follow-up: - 6 weeks with Dr. Isaiah Serge

## 2023-02-02 ENCOUNTER — Telehealth: Payer: Self-pay | Admitting: Primary Care

## 2023-02-02 NOTE — Telephone Encounter (Signed)
Spoke with Dr. Isaiah Liu, ok to re-challenge Esbriet. He can resume today. Needs to be strict with sunscreen anytime he is outside on sun exposures areas. If rash returns we will need to stop medication. Needs follow-up in 4-6 weeks.

## 2023-02-02 NOTE — Telephone Encounter (Signed)
Attempted to call pt but unable to reach. Left message for him to return call. °

## 2023-02-03 ENCOUNTER — Encounter: Payer: Self-pay | Admitting: Cardiology

## 2023-02-03 ENCOUNTER — Ambulatory Visit: Payer: Medicare PPO | Attending: Cardiology | Admitting: Cardiology

## 2023-02-03 VITALS — BP 108/58 | HR 96 | Ht 68.0 in | Wt 148.2 lb

## 2023-02-03 DIAGNOSIS — Z79899 Other long term (current) drug therapy: Secondary | ICD-10-CM

## 2023-02-03 DIAGNOSIS — E785 Hyperlipidemia, unspecified: Secondary | ICD-10-CM

## 2023-02-03 DIAGNOSIS — I10 Essential (primary) hypertension: Secondary | ICD-10-CM | POA: Diagnosis not present

## 2023-02-03 DIAGNOSIS — I2510 Atherosclerotic heart disease of native coronary artery without angina pectoris: Secondary | ICD-10-CM | POA: Diagnosis not present

## 2023-02-03 DIAGNOSIS — G4733 Obstructive sleep apnea (adult) (pediatric): Secondary | ICD-10-CM | POA: Diagnosis not present

## 2023-02-03 MED ORDER — METOPROLOL TARTRATE 25 MG PO TABS: 12.5000 mg | ORAL_TABLET | Freq: Two times a day (BID) | ORAL | 3 refills | Status: DC

## 2023-02-03 NOTE — Progress Notes (Signed)
Cardiology Office Note:    Date:  02/03/2023   ID:  Matthew Liu, DOB Nov 12, 1940, MRN [id number]  PCP:  Paulina Fusi, MD  Cardiologist: Armanda Magic, MD   Referring MD: Paulina Fusi, MD   Chief Complaint  Patient presents with   Coronary Artery Disease   Hypertension   Hyperlipidemia   Sleep Apnea    History of Present Illness:    Matthew Liu is a 82 y.o. male with a hx of ASCHD, hyperlipidemia, GERD, hypertension and OSA.  From cardiac standpoint he suffered an Inf STEMI 07/2013 s/p DES x 2 from ostial to [facility] 07/13/13. Residual Cx disease for med rx (PCI if recurrent pain). Ultimately s/p CABG with LIMA to LAD, SVG to Diag, Sequential SVG to OM2-OM3, SVG to RCA, EVH via right thigh and leg.    He underwent sleep study which showed Severe obstructive sleep apnea with an AHI of 40.4/h and oxygen desaturations as low as 84%.  He underwent CPAP titration and was ultimately placed on auto CPAP from 4 to 15 cm H2O.  He was also found to have nocturnal hypoxemia on CPAP.  Unfortunately he stopped using his CPAP because he had a lot or problems with them malfunctioning and he is done with it.   He is here today for followup and is doing well.  He denies any chest pain or pressure, SOB, DOE, PND, orthopnea, LE edema, dizziness, palpitations or syncope. He ran out of his lopressor and Lisinopril 3-4 months ago.   Past Medical History:  Diagnosis Date   Acute ischemic stroke (HCC) 09/15/2015   Ankle fracture, left 2009   rod placed   Ankle fracture, left    rod placed   Anxiety    Aspirin allergy    a. Anaphylactic rxn.   Barrett's esophagus    CAD (coronary artery disease)    a. Inf STEMI 07/2013: s/p DES x 2 from ostial to [facility] 07/13/13. Residual Cx disease for med rx (PCI if recurrent pain).   Controlled gout    Dyslipidemia    ED (erectile dysfunction)    Edema of both legs    Gastric polyp    GERD (gastroesophageal reflux disease)    Hiatal hernia    History of  nuclear stress test    Nuclear stress test 5/19: EF 53, no ischemia, no infarction; Low Risk   Hydrocele of testis    Hyperglycemia    a. A1C 5.8 in 07/2013.   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   LIMA to LAD, SVG to Diag, Sequential SVG to OM2-OM3, SVG to RCA, EVH via right thigh and leg   Unstable angina [facility])     Past Surgical History:  Procedure Laterality Date   BACK SURGERY     L1 kyphoplasty by Dr Dutch Quint 03/2018     thoractic 02-2018   cataract surgery      bilateral    COLONOSCOPY     CORONARY ANGIOPLASTY     stents    CORONARY ARTERY BYPASS GRAFT N/A 12/12/2018   Procedure: CORONARY ARTERY BYPASS GRAFTING (CABG);  Surgeon: Purcell Nails, MD;  Location: [facility] OR;  Service: Open Heart Surgery;  Laterality: N/A;   ESOPHAGOGASTRODUODENOSCOPY  04/04/2016  Short-segment Barrett's esophagus (biopsied) Gastric polyps (status post polypectomy x1) Hiatal hernia   HYDROCELE EXCISION Left 07/17/2014   Procedure: LEFT HYDROCELECTOMY;  Surgeon: Anner Crete, MD;  Location: [facility] ORS;  Service: Urology;  Laterality: Left;   left ankle surgery  1999   LEFT HEART CATH AND CORONARY ANGIOGRAPHY N/A 12/11/2018   Procedure: LEFT HEART CATH AND CORONARY ANGIOGRAPHY;  Surgeon: Lyn Records, MD;  Location: [facility] INVASIVE CV LAB;  Service: Cardiovascular;  Laterality: N/A;   LEFT HEART CATHETERIZATION WITH CORONARY ANGIOGRAM N/A 07/13/2013   Procedure: LEFT HEART CATHETERIZATION WITH CORONARY ANGIOGRAM;  Surgeon: Lesleigh Noe, MD;  Location: [facility] CATH LAB;  Service: Cardiovascular;  Laterality: N/A;   PERCUTANEOUS CORONARY STENT INTERVENTION (PCI-S)  07/13/2013   Procedure: PERCUTANEOUS CORONARY STENT INTERVENTION (PCI-S);  Surgeon: Lesleigh Noe, MD;  Location: [facility] CATH LAB;  Service:  Cardiovascular;;   TEE WITHOUT CARDIOVERSION N/A 12/12/2018   Procedure: TRANSESOPHAGEAL ECHOCARDIOGRAM (TEE);  Surgeon: Purcell Nails, MD;  Location: [facility] OR;  Service: Open Heart Surgery;  Laterality: N/A;   TONSILLECTOMY      Current Medications: Current Meds  Medication Sig   acetaminophen (TYLENOL) 500 MG tablet Take 1 tablet (500 mg total) by mouth every 6 (six) hours as needed for mild pain or headache.   atorvastatin (LIPITOR) 80 MG tablet Take 1 tablet (80 mg total) by mouth every evening.   carbidopa-levodopa (SINEMET IR) 25-100 MG tablet TAKE 1 TABLET BY MOUTH THREE TIMES A DAY   Cholecalciferol (VITAMIN D3) 50 MCG (2000 UT) capsule Take 2,000 Units by mouth daily.   clopidogrel (PLAVIX) 75 MG tablet TAKE 1 TABLET BY MOUTH EVERY DAY (Patient taking differently: Take 75 mg by mouth daily.)   FLUoxetine (PROZAC) 20 MG capsule Take 20 mg by mouth daily.   gabapentin (NEURONTIN) 300 MG capsule Take 1 capsule (300 mg total) by mouth daily.   hydrochlorothiazide (MICROZIDE) 12.5 MG capsule TAKE 1 CAPSULE BY MOUTH EVERY DAY   mirtazapine (REMERON) 7.5 MG tablet Take 7.5 mg by mouth at bedtime.   Multiple Vitamins-Minerals (ONE-A-DAY MENS 50+) TABS Take 1 tablet by mouth daily with breakfast.   ondansetron (ZOFRAN) 4 MG tablet Take 1 tablet (4 mg total) by mouth every 6 (six) hours as needed for nausea or vomiting.   ondansetron (ZOFRAN-ODT) 4 MG disintegrating tablet Take 4 mg by mouth 4 (four) times daily as needed.   pantoprazole (PROTONIX) 40 MG tablet TAKE 1 TABLET (40 MG TOTAL) BY MOUTH DAILY. PLEASE CALL [phone] TO SCHEDULE AN OFFICE VISIT FOR MORE REFILLS   Pirfenidone (ESBRIET) 267 MG TABS Take 3 tablets (801 mg total) by mouth 3 (three) times daily with meals.     Allergies:   Aspirin and Quinine derivatives   Social History   Socioeconomic History   Marital status: Married    Spouse name: Pat   Number of children: 2   Years of education: 12   Highest education  level: Not on file  Occupational History   Occupation: Chiropractor  Tobacco Use   Smoking status: Never   Smokeless tobacco: Never  Vaping Use   Vaping Use: Never used  Substance and Sexual Activity   Alcohol use: Yes    Comment: occasional beer    Drug use: No   Sexual activity: Not Currently  Other Topics Concern   Not on file  Social History Narrative   Lives with wife   Caffeine use: 1-2 cups coffee per  day   Right handed   Social Determinants of Health   Financial Resource Strain: Not on file  Food Insecurity: Not on file  Transportation Needs: Not on file  Physical Activity: Not on file  Stress: Not on file  Social Connections: Not on file     Family History: The patient's family history includes Heart failure in his father; Hypertension in his mother; Ovarian cancer in his mother. There is no history of Stroke.  ROS:   Please see the history of present illness.    ROS  All other systems reviewed and negative.   EKGs/Labs/Other Studies Reviewed:    The following studies were reviewed today: PSG, CPAP titration, PAP compliance downlaod  EKG:  EKG is not ordered today.    Recent Labs: 03/26/2022: Magnesium 1.7 07/06/2022: NT-Pro BNP 518 09/03/2022: Hemoglobin 12.3; Platelets 169 11/17/2022: BUN 25; Creatinine, Ser 1.09; Potassium 4.1; Sodium 140 01/18/2023: ALT 9   Recent Lipid Panel    Component Value Date/Time   CHOL 108 03/20/2017 1331   TRIG 87 03/20/2017 1331   HDL 45 03/20/2017 1331   CHOLHDL 2.4 03/20/2017 1331   CHOLHDL 2.7 09/15/2015 0205   VLDL 22 09/15/2015 0205   LDLCALC 46 03/20/2017 1331    Physical Exam:    VS:  BP (!) 108/58   Pulse 96   Ht 5\' 8"  (1.727 m)   Wt 148 lb 3.2 oz (67.2 kg)   SpO2 95%   BMI 22.53 kg/m     Wt Readings from Last 3 Encounters:  02/03/23 148 lb 3.2 oz (67.2 kg)  02/01/23 151 lb 3.2 oz (68.6 kg)  01/26/23 153 lb (69.4 kg)     GEN: Well nourished, well developed in no acute  distress HEENT: Normal NECK: No JVD; No carotid bruits LYMPHATICS: No lymphadenopathy CARDIAC:RRR, no murmurs, rubs, gallops RESPIRATORY:  Clear to auscultation without rales, wheezing or rhonchi  ABDOMEN: Soft, non-tender, non-distended MUSCULOSKELETAL:  No edema; No deformity  SKIN: Warm and dry NEUROLOGIC:  Alert and oriented x 3 PSYCHIATRIC:  Normal affect  ASSESSMENT:    1. OSA (obstructive sleep apnea)   2. Essential hypertension   3. Coronary artery disease involving native coronary artery of native heart without angina pectoris   4. Hyperlipidemia LDL goal <70    PLAN:    In order of problems listed above:   OSA  -he stopped using his device because he said it was old and he did not want to fool with it anymore -I reviewed with him the risks of untreated severe OSA including progression of CAD, CVA, CHF, PHTN and encouraged him to let me order a new CPAP but at this time he does not want to use it.    2.  HTN -BP is controlled on exam today but on the soft  -he ran out of Lisinopril and Lopressor 3-4 months ago  -will stop HCTZ due to soft BP and restart Lopressor 12.5 mg twice daily (since he had an MI remotely would rather he be on BB) -BP too soft for Lisinopril at this time -I have personally reviewed and interpreted outside labs performed by patient's PCP which showed serum creatinine 1.09 and potassium 4.1 on 11/17/2022  3.  ASCAD -Status post remote Inf STEMI 07/2013 s/p DES x 2 from ostial to [facility] 07/13/13. Residual Cx disease for med rx (PCI if recurrent pain). -Ultimately s/p CABG with LIMA to LAD, SVG to Diag, Sequential SVG to OM2-OM3, SVG to RCA, EVH via right  thigh and leg  -He denies any anginal symptoms -Continue prescription drug management with atorvastatin 80 mg daily, Plavix 75 mg daily, restart Lopressor 12.5 mg twice daily with as needed refills  4.  Hyperlipidemia -LDL goal <70 -I have personally reviewed and interpreted outside labs performed by  patient's PCP which showed LDL 51 and HDL 29 on 12/14/2020 -Repeat FLP and ALT -Continue prescription drug management with atorvastatin 80 mg daily with as needed refills   Time Spent: 20 minutes total time of encounter, including 15 minutes spent in face-to-face patient care on the date of this encounter. This time includes coordination of care and counseling regarding above mentioned problem list. Remainder of non-face-to-face time involved reviewing chart documents/testing relevant to the patient encounter and documentation in the medical record. I have independently reviewed documentation from referring provider  Medication Adjustments/Labs and Tests Ordered: Current medicines are reviewed at length with the patient today.  Concerns regarding medicines are outlined above.  No orders of the defined types were placed in this encounter.  No orders of the defined types were placed in this encounter.   Signed, Armanda Magic, MD  02/03/2023 3:17 PM    [facility]

## 2023-02-03 NOTE — Addendum Note (Signed)
Addended by: Luellen Pucker on: 02/03/2023 03:39 PM   Modules accepted: Orders

## 2023-02-03 NOTE — Patient Instructions (Signed)
Medication Instructions:  Please STOP taking hydrochlorothiazide and zestril. START taking lopressor 12.5 twice daily with a meal.   *If you need a refill on your cardiac medications before your next appointment, please call your pharmacy*   Lab Work: Please complete a FASTING lipid panel and an ALT in our lab today before you leave.  If you have labs (blood work) drawn today and your tests are completely normal, you will receive your results only by: MyChart Message (if you have MyChart) OR A paper copy in the mail If you have any lab test that is abnormal or we need to change your treatment, we will call you to review the results.   Testing/Procedures: None.   Follow-Up: Your next appointment:   1 year(s)  Provider:   Dr. Armanda Magic, MD

## 2023-02-04 LAB — ALT: ALT: 16 IU/L (ref 0–44)

## 2023-02-04 LAB — LIPID PANEL
Chol/HDL Ratio: 1.9 ratio (ref 0.0–5.0)
Cholesterol, Total: 146 mg/dL (ref 100–199)
HDL: 77 mg/dL (ref >39)
LDL Chol Calc (NIH): 55 mg/dL (ref 0–99)
Triglycerides: 70 mg/dL (ref 0–149)
VLDL Cholesterol Cal: 14 mg/dL (ref 5–40)

## 2023-02-06 NOTE — Telephone Encounter (Signed)
I called and spoke with the pt and notified of response from [facility]. He states he tried starting med back over the weekend and had same problem with rash on his neck and also "felt like my skin tightening up".  He has stopped med again. Sending to Dr Isaiah Serge as Lorain Childes. He has appt with Dr Isaiah Serge on 03/15/23. Advised to keep this appt. Forwarding to Dr Isaiah Serge as Lorain Childes.

## 2023-02-06 NOTE — Telephone Encounter (Signed)
Pt returning missed call. 

## 2023-02-09 DIAGNOSIS — S22050A Wedge compression fracture of T5-T6 vertebra, initial encounter for closed fracture: Secondary | ICD-10-CM | POA: Diagnosis not present

## 2023-03-03 ENCOUNTER — Other Ambulatory Visit (HOSPITAL_COMMUNITY): Payer: Self-pay

## 2023-03-08 ENCOUNTER — Ambulatory Visit: Payer: Medicare PPO | Admitting: Gastroenterology

## 2023-03-08 ENCOUNTER — Encounter: Payer: Self-pay | Admitting: Gastroenterology

## 2023-03-08 VITALS — BP 122/62 | HR 75 | Ht 67.0 in | Wt 153.0 lb

## 2023-03-08 DIAGNOSIS — K2270 Barrett's esophagus without dysplasia: Secondary | ICD-10-CM | POA: Diagnosis not present

## 2023-03-08 DIAGNOSIS — K219 Gastro-esophageal reflux disease without esophagitis: Secondary | ICD-10-CM | POA: Diagnosis not present

## 2023-03-08 MED ORDER — PANTOPRAZOLE SODIUM 40 MG PO TBEC: 40.0000 mg | DELAYED_RELEASE_TABLET | Freq: Every day | ORAL | 4 refills | Status: AC

## 2023-03-08 NOTE — Progress Notes (Signed)
Chief Complaint: FU  Referring Provider:  Paulina Fusi, MD      ASSESSMENT AND PLAN;   #1. GERD with Barrett's #2. 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 Rpt US/CTA in 2 yrs.   Plan: -protonix 40mg  po qd #90, 4RF -Rpt EGD 02/2024 (depending upon other comorbidities) -FU in 1 year.  Earlier if with problems.   HPI:    Matthew Liu is a 82 y.o. 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  Also has infrarenal AAA 3.5 cm on CTA November 2023.  Recommended to repeat ultrasound or CTA in 2 years  No GI problems  No nausea, vomiting, heartburn, regurgitation, odynophagia or dysphagia.  No significant diarrhea or constipation.  No melena or hematochezia. No unintentional weight loss. No abdominal pain.  Diarrhea resolved after stopping Ofev.  No further weight loss  Wt Readings from Last 3 Encounters:  03/08/23 153 lb (69.4 kg)  02/03/23 148 lb 3.2 oz (67.2 kg)  02/01/23 151 lb 3.2 oz (68.6 kg)      Past GI procedures:  EGD 03/03/2021 - Esophageal mucosal changes secondary to established short-segment Barrett's disease. Biopsied. - 3 cm hiatal hernia. - Bx: Barrett's without dysplasia  Colon 03/03/2021  - One 8 mm polyp in the mid ascending colon, removed with a cold snare. Resected and retrieved. - One 2 mm polyp in the distal sigmoid colon, removed with a cold biopsy forceps. Resected and retrieved. - Moderate predominantly sigmoid diverticulosis. - Non-bleeding internal hemorrhoids. - The examined portion of the ileum was normal. - The examination was otherwise normal on direct and retroflexion views -No need to rpt d/t age.   EGD 03/2016: Short segment Barrett's esophagus (confirmed on biopsies with negative dysplasia), gastric  polyps s/p polypectomy (bx-fundic gland polyps), small hiatal hernia. Colonoscopy 10/2004: Scattered diverticula, hemorrhoids.  He refused repeat colonoscopy in 2016.  CTA 09/04/2022 VASCULAR -3.5 cm infrarenal abdominal aortic aneurysm. Recommend follow-up every 2 years. NON-VASCULAR -Cholelithiasis without complicating factors. -Diverticulosis without diverticulitis.    Past Medical History:  Diagnosis Date   Acute ischemic stroke (HCC) 09/15/2015   Ankle fracture, left 2009   rod placed   Ankle fracture, left    rod placed   Anxiety    Aspirin allergy    a. Anaphylactic rxn.   Barrett's esophagus    CAD (coronary artery disease)    a. Inf STEMI 07/2013: s/p DES x 2 from ostial to [facility] 07/13/13. Residual Cx disease for med rx (PCI if recurrent pain).   Controlled gout    Dyslipidemia    ED (erectile dysfunction)    Edema of both legs    Gastric polyp    GERD (gastroesophageal reflux disease)    Hiatal hernia    History of nuclear stress test    Nuclear stress test 5/19: EF 53, no ischemia, no infarction; Low Risk   Hydrocele of testis    Hyperglycemia    a. A1C 5.8 in 07/2013.   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   LIMA to  LAD, SVG to Diag, Sequential SVG to OM2-OM3, SVG to RCA, EVH via right thigh and leg   Unstable angina [facility])     Past Surgical History:  Procedure Laterality Date   BACK SURGERY     L1 kyphoplasty by Dr Dutch Quint 03/2018     thoractic 02-2018   cataract surgery      bilateral    COLONOSCOPY     CORONARY ANGIOPLASTY     stents    CORONARY ARTERY BYPASS GRAFT N/A 12/12/2018   Procedure: CORONARY ARTERY BYPASS GRAFTING (CABG);  Surgeon: Purcell Nails, MD;  Location: [facility] OR;  Service: Open Heart Surgery;  Laterality: N/A;    ESOPHAGOGASTRODUODENOSCOPY  04/04/2016   Short-segment Barrett's esophagus (biopsied) Gastric polyps (status post polypectomy x1) Hiatal hernia   HYDROCELE EXCISION Left 07/17/2014   Procedure: LEFT HYDROCELECTOMY;  Surgeon: Anner Crete, MD;  Location: [facility] ORS;  Service: Urology;  Laterality: Left;   left ankle surgery  1999   LEFT HEART CATH AND CORONARY ANGIOGRAPHY N/A 12/11/2018   Procedure: LEFT HEART CATH AND CORONARY ANGIOGRAPHY;  Surgeon: Lyn Records, MD;  Location: [facility] INVASIVE CV LAB;  Service: Cardiovascular;  Laterality: N/A;   LEFT HEART CATHETERIZATION WITH CORONARY ANGIOGRAM N/A 07/13/2013   Procedure: LEFT HEART CATHETERIZATION WITH CORONARY ANGIOGRAM;  Surgeon: Lesleigh Noe, MD;  Location: [facility] CATH LAB;  Service: Cardiovascular;  Laterality: N/A;   PERCUTANEOUS CORONARY STENT INTERVENTION (PCI-S)  07/13/2013   Procedure: PERCUTANEOUS CORONARY STENT INTERVENTION (PCI-S);  Surgeon: Lesleigh Noe, MD;  Location: [facility] CATH LAB;  Service: Cardiovascular;;   TEE WITHOUT CARDIOVERSION N/A 12/12/2018   Procedure: TRANSESOPHAGEAL ECHOCARDIOGRAM (TEE);  Surgeon: Purcell Nails, MD;  Location: [facility] OR;  Service: Open Heart Surgery;  Laterality: N/A;   TONSILLECTOMY      Family History  Problem Relation Age of Onset   Hypertension Mother    Ovarian cancer Mother    Heart failure Father    Stroke Neg Hx     Social History   Tobacco Use   Smoking status: Never   Smokeless tobacco: Never  Vaping Use   Vaping Use: Never used  Substance Use Topics   Alcohol use: Yes    Comment: occasional beer    Drug use: No    Current Outpatient Medications  Medication Sig Dispense Refill   acetaminophen (TYLENOL) 500 MG tablet Take 1 tablet (500 mg total) by mouth every 6 (six) hours as needed for mild pain or headache. 30 tablet 0   ALPRAZolam (XANAX) 0.25 MG tablet Take 0.25 mg by mouth 2 (two) times daily as needed for anxiety.     atorvastatin (LIPITOR) 80 MG tablet Take 1 tablet (80  mg total) by mouth every evening. 90 tablet 3   carbidopa-levodopa (SINEMET IR) 25-100 MG tablet TAKE 1 TABLET BY MOUTH THREE TIMES A DAY 270 tablet 1   Cholecalciferol (VITAMIN D3) 50 MCG (2000 UT) capsule Take 2,000 Units by mouth daily.     clopidogrel (PLAVIX) 75 MG tablet TAKE 1 TABLET BY MOUTH EVERY DAY (Patient taking differently: Take 75 mg by mouth daily.) 90 tablet 2   FLUoxetine (PROZAC) 20 MG capsule Take 20 mg by mouth daily.     mirtazapine (REMERON) 7.5 MG tablet Take 7.5 mg by mouth at bedtime.     Multiple Vitamins-Minerals (ONE-A-DAY MENS 50+) TABS Take 1 tablet by mouth daily with breakfast.     ondansetron (ZOFRAN) 4 MG tablet Take 1 tablet (4 mg  total) by mouth every 6 (six) hours as needed for nausea or vomiting. 30 tablet 5   ondansetron (ZOFRAN-ODT) 4 MG disintegrating tablet Take 4 mg by mouth 4 (four) times daily as needed.     pantoprazole (PROTONIX) 40 MG tablet TAKE 1 TABLET (40 MG TOTAL) BY MOUTH DAILY. PLEASE CALL [phone] TO SCHEDULE AN OFFICE VISIT FOR MORE REFILLS 90 tablet 0   Pirfenidone (ESBRIET) 267 MG TABS Take 3 tablets (801 mg total) by mouth 3 (three) times daily with meals. 270 tablet 4   gabapentin (NEURONTIN) 300 MG capsule Take 1 capsule (300 mg total) by mouth daily. (Patient not taking: Reported on 03/08/2023) 30 capsule 1   methocarbamol (ROBAXIN) 750 MG tablet Take 750 mg by mouth 2 (two) times daily as needed. (Patient not taking: Reported on 02/03/2023)     metoprolol tartrate (LOPRESSOR) 25 MG tablet Take 0.5 tablets (12.5 mg total) by mouth 2 (two) times daily. (Patient not taking: Reported on 03/08/2023) 180 tablet 3   predniSONE (DELTASONE) 10 MG tablet Take 40mg  x 5 days; 20mg  x 5 days; then stop (Patient not taking: Reported on 03/08/2023) 30 tablet 0   No current facility-administered medications for this visit.    Allergies  Allergen Reactions   Aspirin Hives and Shortness Of Breath    "Shortness of Breath" can't breathe   Quinine  Derivatives Hives and Other (See Comments)    "looks like he has the measles"    Review of Systems:  neg     Physical Exam:    BP 122/62   Pulse 75   Ht 5\' 7"  (1.702 m)   Wt 153 lb (69.4 kg)   BMI 23.96 kg/m  Wt Readings from Last 3 Encounters:  03/08/23 153 lb (69.4 kg)  02/03/23 148 lb 3.2 oz (67.2 kg)  02/01/23 151 lb 3.2 oz (68.6 kg)   Constitutional:  Well-developed, in no acute distress. Psychiatric: Normal mood and affect. Behavior is normal. HEENT: Pupils normal.  Conjunctivae are normal. No scleral icterus. Cardiovascular: Normal rate, regular rhythm. No edema Pulmonary/chest: Effort normal and breath sounds normal. No wheezing, rales or rhonchi. Abdominal: Soft, nondistended. Nontender. Bowel sounds active throughout. There are no masses palpable. No hepatomegaly. Rectal: Deferred Neurological: Alert and oriented to person place and time. Skin: Skin is warm and dry. No rashes noted.  Data Reviewed: I have personally reviewed following labs and imaging studies  CBC:    Latest Ref Rng & Units 09/03/2022   10:30 PM 03/26/2022    3:22 AM 03/25/2022    4:22 AM  CBC  WBC 4.0 - 10.5 K/uL 6.8  6.4  6.5   Hemoglobin 13.0 - 17.0 g/dL 09.8  11.9  14.7   Hematocrit 39.0 - 52.0 % 36.2  32.4  34.6   Platelets 150 - 400 K/uL 169  140  137     CMP:    Latest Ref Rng & Units 02/03/2023    3:38 PM 01/18/2023   11:14 AM 11/17/2022   12:17 PM  CMP  Glucose 70 - 99 mg/dL   94   BUN 6 - 23 mg/dL   25   Creatinine 8.29 - 1.50 mg/dL   5.62   Sodium 130 - 865 mEq/L   140   Potassium 3.5 - 5.1 mEq/L   4.1   Chloride 96 - 112 mEq/L   104   CO2 19 - 32 mEq/L   28   Calcium 8.4 - 10.5 mg/dL   9.1  Total Protein 6.0 - 8.3 g/dL  6.6  6.5   Total Bilirubin 0.2 - 1.2 mg/dL  0.3  0.4   Alkaline Phos 39 - 117 U/L  90  109   AST 0 - 37 U/L  24  24   ALT 0 - 44 IU/L 16  9  9      GFR: CrCl cannot be calculated (Patient's most recent lab result is older than the maximum 21 days  allowed.). Liver Function Tests: No results for input(s): "AST", "ALT", "ALKPHOS", "BILITOT", "PROT", "ALBUMIN" in the last 168 hours. No results for input(s): "LIPASE", "AMYLASE" in the last 168 hours. No results for input(s): "AMMONIA" in the last 168 hours. Coagulation Profile: No results for input(s): "INR", "PROTIME" in the last 168 hours. HbA1C: No results for input(s): "HGBA1C" in the last 72 hours. Lipid Profile: No results for input(s): "CHOL", "HDL", "LDLCALC", "TRIG", "CHOLHDL", "LDLDIRECT" in the last 72 hours. Thyroid Function Tests: No results for input(s): "TSH", "T4TOTAL", "FREET4", "T3FREE", "THYROIDAB" in the last 72 hours. Anemia Panel: No results for input(s): "VITAMINB12", "FOLATE", "FERRITIN", "TIBC", "IRON", "RETICCTPCT" in the last 72 hours.  No results found for this or any previous visit (from the past 240 hour(s)).    Radiology Studies: No results found.    Edman Circle, MD 03/08/2023, 3:53 PM  Cc: Paulina Fusi, MD

## 2023-03-08 NOTE — Patient Instructions (Addendum)
_______________________________________________________  If your blood pressure at your visit was 140/90 or greater, please contact your primary care physician to follow up on this.  _______________________________________________________  If you are age 83 or older, your body mass index should be between 23-30. Your Body mass index is 23.96 kg/m. If this is out of the aforementioned range listed, please consider follow up with your Primary Care Provider.  If you are age 27 or younger, your body mass index should be between 19-25. Your Body mass index is 23.96 kg/m. If this is out of the aformentioned range listed, please consider follow up with your Primary Care Provider.   ________________________________________________________  The [facility] providers would like to encourage you to use [facility] to communicate with providers for non-urgent requests or questions.  Due to long hold times on the telephone, sending your provider a message by [facility] may be a faster and more efficient way to get a response.  Please allow 48 business hours for a response.  Please remember that this is for non-urgent requests.  _______________________________________________________  We have sent the following medications to your pharmacy for you to pick up at your convenience: Protonix  Repeat EGD for 02-2024. Please call 2 months prior to schedule this. A letter will be sent as it gets closer.  Thank you,  Dr. Lynann Bologna

## 2023-03-14 ENCOUNTER — Other Ambulatory Visit (HOSPITAL_COMMUNITY): Payer: Self-pay

## 2023-03-15 ENCOUNTER — Encounter: Payer: Self-pay | Admitting: Pulmonary Disease

## 2023-03-15 ENCOUNTER — Other Ambulatory Visit (HOSPITAL_COMMUNITY): Payer: Self-pay

## 2023-03-15 ENCOUNTER — Ambulatory Visit: Payer: Medicare PPO | Admitting: Pulmonary Disease

## 2023-03-15 VITALS — BP 118/60 | HR 76 | Temp 97.8°F | Ht 68.0 in | Wt 154.2 lb

## 2023-03-15 DIAGNOSIS — L568 Other specified acute skin changes due to ultraviolet radiation: Secondary | ICD-10-CM | POA: Diagnosis not present

## 2023-03-15 DIAGNOSIS — J84112 Idiopathic pulmonary fibrosis: Secondary | ICD-10-CM

## 2023-03-15 NOTE — Progress Notes (Signed)
Matthew Liu    [id number]    26-Oct-1940  Primary Care Physician:Schultz, Jonetta Speak, MD  Referring Physician: Paulina Fusi, MD [address] [place],  [place] [address]  Chief complaint: Follow-up for interstitial lung disease  HPI: 82 y.o.  with history of hypertension, coronary artery disease, hyperlipidemia, allergies, GERD He had an abnormal x-ray at his primary care with a follow-up CT showing UIP pattern pulmonary fibrosis at [facility] and has been referred here for further evaluation Complains of mild dyspnea on exertion.  No cough or fevers or chills  He is also being worked up for sleep apnea.  A previous sleep study was nondiagnostic as he did not sleep and he has a repeat in lab sleep study on 08/27/2021.  Subsequently started on CPAP  Pets: No pets Occupation: Retired Civil engineer, contracting Exposures: No mold, hot tub, Financial controller.  No feather pillows or comforter ILD questionnaire 08/26/2021-negative Smoking history: Never smoker Travel history: No significant travel history Relevant family history: No family history of lung disease  Interim history: Started on Ofev in February 2023 This was poorly tolerated with significant fatigue, diarrhea.  He was eventually treated in the ED in 03/24/2022 with labs showing elevated creatinine.  He was given IV fluids and discharged the next day.  His follow-up creatinine has normalized  He was taken off Ofev and started on Esbriet in October 2023.  He then developed skin rash with Esbriet.  Esbriet was held for a couple of weeks with improvement in symptoms and then on rechallenge day immediately reoccurred so now he is off all antifibrotic therapy.  Outpatient Encounter Medications as of 03/15/2023  Medication Sig   acetaminophen (TYLENOL) 500 MG tablet Take 1 tablet (500 mg total) by mouth every 6 (six) hours as needed for mild pain or headache.   ALPRAZolam (XANAX) 0.25 MG tablet Take 0.25 mg by mouth 2  (two) times daily as needed for anxiety.   atorvastatin (LIPITOR) 80 MG tablet Take 1 tablet (80 mg total) by mouth every evening.   carbidopa-levodopa (SINEMET IR) 25-100 MG tablet TAKE 1 TABLET BY MOUTH THREE TIMES A DAY   Cholecalciferol (VITAMIN D3) 50 MCG (2000 UT) capsule Take 2,000 Units by mouth daily.   clopidogrel (PLAVIX) 75 MG tablet TAKE 1 TABLET BY MOUTH EVERY DAY (Patient taking differently: Take 75 mg by mouth daily.)   FLUoxetine (PROZAC) 20 MG capsule Take 20 mg by mouth daily.   gabapentin (NEURONTIN) 300 MG capsule Take 1 capsule (300 mg total) by mouth daily.   methocarbamol (ROBAXIN) 750 MG tablet Take 750 mg by mouth 2 (two) times daily as needed.   metoprolol tartrate (LOPRESSOR) 25 MG tablet Take 0.5 tablets (12.5 mg total) by mouth 2 (two) times daily.   mirtazapine (REMERON) 7.5 MG tablet Take 7.5 mg by mouth at bedtime.   Multiple Vitamins-Minerals (ONE-A-DAY MENS 50+) TABS Take 1 tablet by mouth daily with breakfast.   ondansetron (ZOFRAN) 4 MG tablet Take 1 tablet (4 mg total) by mouth every 6 (six) hours as needed for nausea or vomiting.   ondansetron (ZOFRAN-ODT) 4 MG disintegrating tablet Take 4 mg by mouth 4 (four) times daily as needed.   pantoprazole (PROTONIX) 40 MG tablet Take 1 tablet (40 mg total) by mouth daily.   Pirfenidone (ESBRIET) 267 MG TABS Take 3 tablets (801 mg total) by mouth 3 (three) times daily with meals. (Patient not taking: Reported on 03/15/2023)   [  DISCONTINUED] predniSONE (DELTASONE) 10 MG tablet Take 40mg  x 5 days; 20mg  x 5 days; then stop (Patient not taking: Reported on 03/08/2023)   No facility-administered encounter medications on file as of 03/15/2023.   Physical Exam: Blood pressure 118/60, pulse 76, temperature 97.8 F (36.6 C), temperature source Oral, height 5\' 8"  (1.727 m), weight 154 lb 3.2 oz (69.9 kg), SpO2 96 %. Gen:      No acute distress HEENT:  EOMI, sclera anicteric Neck:     No masses; no thyromegaly Lungs:    Clear  to auscultation bilaterally; normal respiratory effort CV:         Regular rate and rhythm; no murmurs Abd:      + bowel sounds; soft, non-tender; no palpable masses, no distension Ext:    No edema; adequate peripheral perfusion Skin:      Warm and dry; no rash Neuro: alert and oriented x 3 Psych: normal mood and affect   Data Reviewed: Imaging: CT chest 01/08/2018- basal predominant traction bronchiectasis, reticulation High-res CT 06/23/2021-mild progression of basilar predominant fibrotic changes with minimal honeycombing in the right middle lobe base.  UIP pattern.  High-resolution CT 07/13/2022 Reviewed images from [facility] Examination is significantly limited by motion artifact.  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  Mild groundglass opacities in the superior segment of the left lower lobe Coronary artery disease.  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  High suspicion for IPF He does not have significant exposures or signs and symptoms of connective tissue disease, CTD serologies are negative  We discussed his diagnosis and trajectory of disease.  We reviewed treatment options such as antifibrotics to slow down the progression of disease.    Started him on Ofev but that is poorly tolerated with significant diarrhea, fatigue and dehydration Now Esbriet is not tolerated as well due to skin rash Observe him off antifibrotic therapy and reassess in 3 months.  OSA On CPAP.  Managed by Dr. Mayford Knife,  cardiology  Plan/Recommendations: Discontinue Esbriet. Labs for monitoring.  Chilton Greathouse MD [facility] 03/15/2023, 3:52 PM  CC: Paulina Fusi, MD

## 2023-03-15 NOTE — Patient Instructions (Addendum)
Will discontinue Esbriet as he is having persistent issues with skin rash Will observe you for now and reassess in 3 months if you want to restart any medications.

## 2023-03-17 ENCOUNTER — Other Ambulatory Visit (HOSPITAL_COMMUNITY): Payer: Self-pay

## 2023-04-11 DIAGNOSIS — F331 Major depressive disorder, recurrent, moderate: Secondary | ICD-10-CM | POA: Diagnosis not present

## 2023-04-11 DIAGNOSIS — N529 Male erectile dysfunction, unspecified: Secondary | ICD-10-CM | POA: Diagnosis not present

## 2023-04-11 DIAGNOSIS — R630 Anorexia: Secondary | ICD-10-CM | POA: Diagnosis not present

## 2023-04-26 DIAGNOSIS — F331 Major depressive disorder, recurrent, moderate: Secondary | ICD-10-CM | POA: Diagnosis not present

## 2023-04-26 DIAGNOSIS — F419 Anxiety disorder, unspecified: Secondary | ICD-10-CM | POA: Diagnosis not present

## 2023-06-06 ENCOUNTER — Other Ambulatory Visit: Payer: Self-pay

## 2023-07-24 DIAGNOSIS — I6990 Unspecified sequelae of unspecified cerebrovascular disease: Secondary | ICD-10-CM | POA: Diagnosis not present

## 2023-07-24 DIAGNOSIS — E785 Hyperlipidemia, unspecified: Secondary | ICD-10-CM | POA: Diagnosis not present

## 2023-07-24 DIAGNOSIS — I2510 Atherosclerotic heart disease of native coronary artery without angina pectoris: Secondary | ICD-10-CM | POA: Diagnosis not present

## 2023-07-24 DIAGNOSIS — Z23 Encounter for immunization: Secondary | ICD-10-CM | POA: Diagnosis not present

## 2023-07-24 DIAGNOSIS — Z9861 Coronary angioplasty status: Secondary | ICD-10-CM | POA: Diagnosis not present

## 2023-07-24 DIAGNOSIS — F331 Major depressive disorder, recurrent, moderate: Secondary | ICD-10-CM | POA: Diagnosis not present

## 2023-07-24 DIAGNOSIS — R7301 Impaired fasting glucose: Secondary | ICD-10-CM | POA: Diagnosis not present

## 2023-07-24 DIAGNOSIS — I10 Essential (primary) hypertension: Secondary | ICD-10-CM | POA: Diagnosis not present

## 2023-07-24 DIAGNOSIS — H6123 Impacted cerumen, bilateral: Secondary | ICD-10-CM | POA: Diagnosis not present

## 2023-07-26 ENCOUNTER — Other Ambulatory Visit: Payer: Self-pay

## 2023-07-27 ENCOUNTER — Other Ambulatory Visit (HOSPITAL_COMMUNITY): Payer: Self-pay

## 2023-07-28 ENCOUNTER — Encounter: Payer: Self-pay | Admitting: Physician Assistant

## 2023-07-28 ENCOUNTER — Ambulatory Visit: Payer: Medicare PPO | Admitting: Physician Assistant

## 2023-07-28 ENCOUNTER — Other Ambulatory Visit: Payer: Self-pay

## 2023-07-28 VITALS — BP 141/57 | HR 60 | Resp 18 | Ht 68.0 in | Wt 145.0 lb

## 2023-07-28 DIAGNOSIS — F0670 Mild neurocognitive disorder due to known physiological condition without behavioral disturbance: Secondary | ICD-10-CM | POA: Diagnosis not present

## 2023-07-28 DIAGNOSIS — G20A1 Parkinson's disease without dyskinesia, without mention of fluctuations: Secondary | ICD-10-CM

## 2023-07-28 MED ORDER — CARBIDOPA-LEVODOPA 25-100 MG PO TABS: 1.0000 | ORAL_TABLET | Freq: Three times a day (TID) | ORAL | 3 refills | Status: DC

## 2023-07-28 NOTE — Progress Notes (Signed)
Per chart patient d/c'd due to reaction (skin rash) in June 2024. Disenrolled patient.

## 2023-07-28 NOTE — Patient Instructions (Signed)
It was a pleasure to see you today at our office. Everything is stable no new findings   Recommendations:  Follow up in  6 months Continue Carbidopa Levodopa as prescribed, no changes  Continue with your other medicines  Use your hearing aids!!!    RECOMMENDATIONS FOR ALL PATIENTS WITH MEMORY PROBLEMS: 1. Continue to exercise (Recommend 30 minutes of walking everyday, or 3 hours every week) 2. Increase social interactions - continue going to Church and enjoy social gatherings with friends and family 3. Eat healthy, avoid fried foods and eat more fruits and vegetables 4. Maintain adequate blood pressure, blood sugar, and blood cholesterol level. Reducing the risk of stroke and cardiovascular disease also helps promoting better memory. 5. Avoid stressful situations. Live a simple life and avoid aggravations. Organize your time and prepare for the next day in anticipation. 6. Sleep well, avoid any interruptions of sleep and avoid any distractions in the bedroom that may interfere with adequate sleep quality 7. Avoid sugar, avoid sweets as there is a strong link between excessive sugar intake, diabetes, and cognitive impairment We discussed the Mediterranean diet, which has been shown to help patients reduce the risk of progressive memory disorders and reduces cardiovascular risk. This includes eating fish, eat fruits and green leafy vegetables, nuts like almonds and hazelnuts, walnuts, and also use olive oil. Avoid fast foods and fried foods as much as possible. Avoid sweets and sugar as sugar use has been linked to worsening of memory function.  There is always a concern of gradual progression of memory problems. If this is the case, then we may need to adjust level of care according to patient needs. Support, both to the patient and caregiver, should then be put into place.    FALL PRECAUTIONS: Be cautious when walking. Scan the area for obstacles that may increase the risk of trips and falls.  When getting up in the mornings, sit up at the edge of the bed for a few minutes before getting out of bed. Consider elevating the bed at the head end to avoid drop of blood pressure when getting up. Walk always in a well-lit room (use night lights in the walls). Avoid area rugs or power cords from appliances in the middle of the walkways. Use a walker or a cane if necessary and consider physical therapy for balance exercise. Get your eyesight checked regularly.  FINANCIAL OVERSIGHT: Supervision, especially oversight when making financial decisions or transactions is also recommended.  HOME SAFETY: Consider the safety of the kitchen when operating appliances like stoves, microwave oven, and blender. Consider having supervision and share cooking responsibilities until no longer able to participate in those. Accidents with firearms and other hazards in the house should be identified and addressed as well.   ABILITY TO BE LEFT ALONE: If patient is unable to contact 911 operator, consider using LifeLine, or when the need is there, arrange for someone to stay with patients. Smoking is a fire hazard, consider supervision or cessation. Risk of wandering should be assessed by caregiver and if detected at any point, supervision and safe proof recommendations should be instituted.  MEDICATION SUPERVISION: Inability to self-administer medication needs to be constantly addressed. Implement a mechanism to ensure safe administration of the medications.   DRIVING: Regarding driving, in patients with progressive memory problems, driving will be impaired. We advise to have someone else do the driving if trouble finding directions or if minor accidents are reported. Independent driving assessment is available to determine safety of driving.     If you are interested in the driving assessment, you can contact the following:  [employer] in [place] [phone]  [facility]  [phone]  [facility] [phone]  [facility] [phone] or [phone]   

## 2023-07-28 NOTE — Progress Notes (Signed)
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 Patient is not on antidementia medication at this time. Memory is stable, MMSE today is 30/30. Patient is able to participate on his ADLs and to drive without difficulties  For tremors Matthew Liu is on carbidopa-levodopa 25-100 tid, tolerating well, symptoms are stable      Recommendations:   Follow up in  6 months. Recommend good control of cardiovascular risk factors Continue to control mood as per PCP For Parkinson's disease, continue carbidopa levodopa 25 100 mg 3 times a day, side effects discussed    Subjective:   This patient is here alone. Previous records as well as any outside records available were reviewed prior to todays visit.   Patient was last seen on 01/26/2023 with MMSE 29/30.     Any changes in memory since last visit? "Same".  Matthew Liu is able to remember recent conversations, information and names of people without any issues. repeats oneself?  Endorsed Disoriented when walking into a room?  Patient denies    Leaving objects in unusual places?  Patient denies   Wandering behavior?   denies   Any personality changes since last visit? denies   Any worsening depression?: denies.   Hallucinations or paranoia?  Denies.   Seizures?   denies    Any sleep changes? Sleeps well. Denies vivid dreams, REM behavior or sleepwalking   Sleep apnea?   denies    Any hygiene concerns?   denies   Independent of bathing and dressing?  Endorsed  Does the patient needs help with medications? Daughter is in charge  Who is in charge of the finances?  Daughter is in charge    Any changes in appetite?  denies     Patient have trouble swallowing?  denies   Does the patient cook? Yes  Any kitchen accidents such as leaving the stove  on?   denies   Any headaches?    denies   Vision changes? denies Chronic pain?  denies   Ambulates with difficulty?   denies    Recent falls or head injuries? Was trying to follow his  101 yo grandson and fell. No LOC or head injury   Unilateral weakness, numbness or tingling?   denies   Any tremors?  Endorsed, worse if Matthew Liu is nervous.  Denies dropping objects or significant drooling.  Tremors are R>L Any anosmia?   Denies.   Any incontinence of urine?  Matthew Liu goes frequently because "I drink a lot of water ". Any bowel dysfunction?  denies      Patient lives with wife Does the patient drive?  Yes, denies any issues  Past Medical History:  Diagnosis Date   Acute ischemic stroke (HCC) 09/15/2015   Ankle fracture, left 2009   rod placed   Ankle fracture, left    rod placed   Anxiety    Aspirin allergy    a. Anaphylactic rxn.   Barrett's esophagus    CAD (coronary artery disease)    a. Inf STEMI 07/2013: s/p DES x 2 from ostial to [facility] 07/13/13. Residual Cx disease for med rx (PCI if recurrent pain).   Controlled gout    Dyslipidemia    ED (erectile dysfunction)    Edema of both legs    Gastric polyp  GERD (gastroesophageal reflux disease)    Hiatal hernia    History of nuclear stress test    Nuclear stress test 5/19: EF 53, no ischemia, no infarction; Low Risk   Hydrocele of testis    Hyperglycemia    a. A1C 5.8 in 07/2013.   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   LIMA to LAD, SVG to Diag, Sequential SVG to OM2-OM3, SVG to RCA, EVH via right thigh and leg   Unstable angina [facility])      Past Surgical History:  Procedure Laterality Date   BACK SURGERY     L1 kyphoplasty by Dr Dutch Quint 03/2018     thoractic 02-2018   cataract surgery      bilateral    COLONOSCOPY      CORONARY ANGIOPLASTY     stents    CORONARY ARTERY BYPASS GRAFT N/A 12/12/2018   Procedure: CORONARY ARTERY BYPASS GRAFTING (CABG);  Surgeon: Purcell Nails, MD;  Location: [facility] OR;  Service: Open Heart Surgery;  Laterality: N/A;   ESOPHAGOGASTRODUODENOSCOPY  04/04/2016   Short-segment Barrett's esophagus (biopsied) Gastric polyps (status post polypectomy x1) Hiatal hernia   HYDROCELE EXCISION Left 07/17/2014   Procedure: LEFT HYDROCELECTOMY;  Surgeon: Anner Crete, MD;  Location: [facility] ORS;  Service: Urology;  Laterality: Left;   left ankle surgery  1999   LEFT HEART CATH AND CORONARY ANGIOGRAPHY N/A 12/11/2018   Procedure: LEFT HEART CATH AND CORONARY ANGIOGRAPHY;  Surgeon: Lyn Records, MD;  Location: [facility] INVASIVE CV LAB;  Service: Cardiovascular;  Laterality: N/A;   LEFT HEART CATHETERIZATION WITH CORONARY ANGIOGRAM N/A 07/13/2013   Procedure: LEFT HEART CATHETERIZATION WITH CORONARY ANGIOGRAM;  Surgeon: Lesleigh Noe, MD;  Location: [facility] CATH LAB;  Service: Cardiovascular;  Laterality: N/A;   PERCUTANEOUS CORONARY STENT INTERVENTION (PCI-S)  07/13/2013   Procedure: PERCUTANEOUS CORONARY STENT INTERVENTION (PCI-S);  Surgeon: Lesleigh Noe, MD;  Location: [facility] CATH LAB;  Service: Cardiovascular;;   TEE WITHOUT CARDIOVERSION N/A 12/12/2018   Procedure: TRANSESOPHAGEAL ECHOCARDIOGRAM (TEE);  Surgeon: Purcell Nails, MD;  Location: [facility] OR;  Service: Open Heart Surgery;  Laterality: N/A;   TONSILLECTOMY       PREVIOUS MEDICATIONS:   CURRENT MEDICATIONS:  Outpatient Encounter Medications as of 07/28/2023  Medication Sig   acetaminophen (TYLENOL) 500 MG tablet Take 1 tablet (500 mg total) by mouth every 6 (six) hours as needed for mild pain or headache.   ALPRAZolam (XANAX) 0.25 MG tablet Take 0.25 mg by mouth 2 (two) times daily as needed for anxiety.   atorvastatin (LIPITOR) 80 MG tablet Take 1 tablet (80 mg total) by mouth every evening.   Cholecalciferol (VITAMIN D3) 50 MCG (2000 UT) capsule  Take 2,000 Units by mouth daily.   clopidogrel (PLAVIX) 75 MG tablet TAKE 1 TABLET BY MOUTH EVERY DAY (Patient taking differently: Take 75 mg by mouth daily.)   FLUoxetine (PROZAC) 20 MG capsule Take 20 mg by mouth daily.   methocarbamol (ROBAXIN) 750 MG tablet Take 750 mg by mouth 2 (two) times daily as needed.   metoprolol tartrate (LOPRESSOR) 25 MG tablet Take 0.5 tablets (12.5 mg total) by mouth 2 (two) times daily.   mirtazapine (REMERON) 7.5 MG tablet Take 7.5 mg by mouth  at bedtime.   Multiple Vitamins-Minerals (ONE-A-DAY MENS 50+) TABS Take 1 tablet by mouth daily with breakfast.   ondansetron (ZOFRAN) 4 MG tablet Take 1 tablet (4 mg total) by mouth every 6 (six) hours as needed for nausea or vomiting.   ondansetron (ZOFRAN-ODT) 4 MG disintegrating tablet Take 4 mg by mouth 4 (four) times daily as needed.   pantoprazole (PROTONIX) 40 MG tablet Take 1 tablet (40 mg total) by mouth daily.   Pirfenidone (ESBRIET) 267 MG TABS Take 3 tablets (801 mg total) by mouth 3 (three) times daily with meals.   [DISCONTINUED] carbidopa-levodopa (SINEMET IR) 25-100 MG tablet TAKE 1 TABLET BY MOUTH THREE TIMES A DAY   carbidopa-levodopa (SINEMET IR) 25-100 MG tablet Take 1 tablet by mouth 3 (three) times daily.   gabapentin (NEURONTIN) 300 MG capsule Take 1 capsule (300 mg total) by mouth daily. (Patient not taking: Reported on 07/28/2023)   No facility-administered encounter medications on file as of 07/28/2023.     Objective:     PHYSICAL EXAMINATION:    VITALS:   Vitals:   07/28/23 1526 07/28/23 1554  BP: (!) 157/67 (!) 141/57  Pulse: 60   Resp: 18   SpO2: 98%   Weight: 145 lb (65.8 kg)   Height: 5\' 8"  (1.727 m)     GEN:  The patient appears stated age and is in NAD. HEENT:  Normocephalic, atraumatic.   Neurological examination:  General: NAD, well-groomed, appears stated age. Orientation: The patient is alert. Oriented to person, place and date Cranial nerves: There is good facial  symmetry. hypomimia is noted.  The speech is fluent and clear at times tangential, mild hypophonia.  Matthew Liu reads lips due to decreased hearing.  No aphasia or dysarthria. Fund of knowledge is appropriate. Recent memory and remote memory is normal.  Attention and concentration are normal.  Able to name objects and repeat phrases.  Hearing is reduced to conversational tone,   Delayed recall 3/3   Sensation: Sensation is intact to light touch throughout Motor: Strength is at least antigravity x4. DTR's 2/4 in UE/LE      03/18/2021    3:00 PM  Montreal Cognitive Assessment   Visuospatial/ Executive (0/5) 3  Naming (0/3) 3  Attention: Read list of digits (0/2) 2  Attention: Read list of letters (0/1) 1  Attention: Serial 7 subtraction starting at 100 (0/3) 3  Language: Repeat phrase (0/2) 1  Language : Fluency (0/1) 0  Abstraction (0/2) 0  Delayed Recall (0/5) 3  Orientation (0/6) 5  Total 21  Adjusted Score (based on education) 22       07/29/2023    6:00 PM 01/26/2023    3:00 PM 01/25/2022    8:00 PM  MMSE - Mini Mental State Exam  Orientation to time 5 4 5   Orientation to Place 5 5 5   Registration 3 3 3   Attention/ Calculation 5 5 5   Recall 3 3 3   Language- name 2 objects 2 2 2   Language- repeat 1 1 1   Language- follow 3 step command 3 3 3   Language- read & follow direction 1 1 1   Write a sentence 1 1 1   Copy design 1 1 1   Total score 30 29 30        Movement examination: Tone: There is normal tone in the UE/LE, very mild cogwheeling in wrists  Abnormal movements:  mild intention tremor B, not at rest.  No myoclonus.  No asterixis.   Coordination:  There is  no decremation with RAM's. Normal finger to nose  Gait and Station: The patient has no difficulty arising out of a deep-seated chair without the use of the hands. The patient's stride length is good.  Very mild decreased arm swing.  Gait is cautious and narrow.   Thank you for allowing [place] the opportunity to participate in  the care of this nice patient. Please do not hesitate to contact us for any questions or concerns.   Total time spent on today's visit was 22 minutes dedicated to this patient today, preparing to see patient, examining the patient, ordering tests and/or medications and counseling the patient, documenting clinical information in the EHR or other health record, independently interpreting results and communicating results to the patient/family, discussing treatment and goals, answering patient's questions and coordinating care.  Cc:  Paulina Fusi, MD  Marlowe Kays 07/29/2023 7:11 PM

## 2023-09-11 DIAGNOSIS — S39012A Strain of muscle, fascia and tendon of lower back, initial encounter: Secondary | ICD-10-CM | POA: Diagnosis not present

## 2023-09-14 DIAGNOSIS — S39012A Strain of muscle, fascia and tendon of lower back, initial encounter: Secondary | ICD-10-CM | POA: Diagnosis not present

## 2023-10-30 DIAGNOSIS — E785 Hyperlipidemia, unspecified: Secondary | ICD-10-CM | POA: Diagnosis not present

## 2023-10-30 DIAGNOSIS — F331 Major depressive disorder, recurrent, moderate: Secondary | ICD-10-CM | POA: Diagnosis not present

## 2023-10-30 DIAGNOSIS — I6990 Unspecified sequelae of unspecified cerebrovascular disease: Secondary | ICD-10-CM | POA: Diagnosis not present

## 2023-10-30 DIAGNOSIS — F419 Anxiety disorder, unspecified: Secondary | ICD-10-CM | POA: Diagnosis not present

## 2023-10-30 DIAGNOSIS — R7301 Impaired fasting glucose: Secondary | ICD-10-CM | POA: Diagnosis not present

## 2023-10-30 DIAGNOSIS — I2510 Atherosclerotic heart disease of native coronary artery without angina pectoris: Secondary | ICD-10-CM | POA: Diagnosis not present

## 2023-10-30 DIAGNOSIS — I10 Essential (primary) hypertension: Secondary | ICD-10-CM | POA: Diagnosis not present

## 2023-10-30 DIAGNOSIS — Z9861 Coronary angioplasty status: Secondary | ICD-10-CM | POA: Diagnosis not present

## 2023-11-02 DIAGNOSIS — D649 Anemia, unspecified: Secondary | ICD-10-CM | POA: Diagnosis not present

## 2023-11-02 DIAGNOSIS — D531 Other megaloblastic anemias, not elsewhere classified: Secondary | ICD-10-CM | POA: Diagnosis not present

## 2023-12-29 ENCOUNTER — Other Ambulatory Visit: Payer: Self-pay | Admitting: Emergency Medicine

## 2024-01-01 NOTE — Telephone Encounter (Signed)
 Please advise refill. Pt last seen on 03-15-2023 by Dr Isaiah Serge

## 2024-01-15 DIAGNOSIS — I2510 Atherosclerotic heart disease of native coronary artery without angina pectoris: Secondary | ICD-10-CM | POA: Diagnosis not present

## 2024-01-15 DIAGNOSIS — R600 Localized edema: Secondary | ICD-10-CM | POA: Diagnosis not present

## 2024-01-15 DIAGNOSIS — Z9861 Coronary angioplasty status: Secondary | ICD-10-CM | POA: Diagnosis not present

## 2024-01-16 DIAGNOSIS — S93431A Sprain of tibiofibular ligament of right ankle, initial encounter: Secondary | ICD-10-CM | POA: Diagnosis not present

## 2024-01-16 DIAGNOSIS — M24571 Contracture, right ankle: Secondary | ICD-10-CM | POA: Diagnosis not present

## 2024-01-16 DIAGNOSIS — S82871M Displaced pilon fracture of right tibia, subsequent encounter for open fracture type I or II with nonunion: Secondary | ICD-10-CM | POA: Diagnosis not present

## 2024-01-19 DIAGNOSIS — R600 Localized edema: Secondary | ICD-10-CM | POA: Diagnosis not present

## 2024-01-29 ENCOUNTER — Encounter: Payer: Self-pay | Admitting: Cardiology

## 2024-01-29 ENCOUNTER — Ambulatory Visit: Payer: Medicare PPO | Attending: Cardiology | Admitting: Cardiology

## 2024-01-29 VITALS — BP 148/76 | HR 76 | Ht 67.0 in | Wt 133.6 lb

## 2024-01-29 DIAGNOSIS — G4733 Obstructive sleep apnea (adult) (pediatric): Secondary | ICD-10-CM

## 2024-01-29 DIAGNOSIS — E785 Hyperlipidemia, unspecified: Secondary | ICD-10-CM | POA: Diagnosis not present

## 2024-01-29 DIAGNOSIS — I2510 Atherosclerotic heart disease of native coronary artery without angina pectoris: Secondary | ICD-10-CM

## 2024-01-29 DIAGNOSIS — I10 Essential (primary) hypertension: Secondary | ICD-10-CM

## 2024-01-29 MED ORDER — LOSARTAN POTASSIUM 25 MG PO TABS: 25.0000 mg | ORAL_TABLET | Freq: Every day | ORAL | 3 refills | Status: DC

## 2024-01-29 MED ORDER — CARVEDILOL 3.125 MG PO TABS: 3.1250 mg | ORAL_TABLET | Freq: Two times a day (BID) | ORAL | 3 refills | Status: DC

## 2024-01-29 NOTE — Progress Notes (Signed)
 Cardiology Office Note:    Date:  01/29/2024   ID:  Matthew Liu, DOB Nov 01, 1940, MRN [id number]  PCP:  Adrian Hopper, MD  Cardiologist: Gaylyn Keas, MD   Referring MD: Adrian Hopper, MD   Chief Complaint  Patient presents with   Coronary Artery Disease   Hypertension   Hyperlipidemia    History of Present Illness:    Matthew Liu is a 83 y.o. male with a hx of ASCHD, hyperlipidemia, GERD, hypertension and OSA.  From cardiac standpoint he suffered an Inf STEMI 07/2013 s/p DES x 2 from ostial to [facility] 07/13/13. Residual Cx disease for med rx (PCI if recurrent pain). Ultimately s/p CABG with LIMA to LAD, SVG to Diag, Sequential SVG to OM2-OM3, SVG to RCA, EVH via right thigh and leg.    He underwent sleep study which showed Severe obstructive sleep apnea with an AHI of 40.4/h and oxygen desaturations as low as 84%.  He underwent CPAP titration and was ultimately placed on auto CPAP from 4 to 15 cm H2O.  He was also found to have nocturnal hypoxemia on CPAP.  Unfortunately he stopped using his CPAP because he had a lot or problems with them malfunctioning and he is done with it.   He is here today for followup and is doing well.  He recently was seen at [facility] for increased SOB and LE edema.  2D echo showed EF 40-45% which is new and started on Lasix  40mg  daily.  He has cut out added salt but does admit to getting take out or eating out 50% of the time. He denies any chest pain or pressure, PND, orthopnea, dizziness, palpitations or syncope. He is compliant with his meds and is tolerating meds with no SE.    Past Medical History:  Diagnosis Date   Acute ischemic stroke (HCC) 09/15/2015   Ankle fracture, left 2009   rod placed   Ankle fracture, left    rod placed   Anxiety    Aspirin  allergy    a. Anaphylactic rxn.   Barrett's esophagus    CAD (coronary artery disease)    a. Inf STEMI 07/2013: s/p DES x 2 from ostial to [facility] 07/13/13. Residual Cx disease for med  rx (PCI if recurrent pain).   Controlled gout    Dyslipidemia    ED (erectile dysfunction)    Edema of both legs    Gastric polyp    GERD (gastroesophageal reflux disease)    Hiatal hernia    History of nuclear stress test    Nuclear stress test 5/19: EF 53, no ischemia, no infarction; Low Risk   Hydrocele of testis    Hyperglycemia    a. A1C 5.8 in 07/2013.   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   LIMA to LAD, SVG to Diag, Sequential SVG to OM2-OM3, SVG to RCA, EVH via right thigh and leg   Unstable angina [facility])     Past Surgical History:  Procedure Laterality Date   BACK SURGERY     L1 kyphoplasty by Dr Gwendlyn Lemmings 03/2018     thoractic [id number]   cataract surgery      bilateral    COLONOSCOPY     CORONARY ANGIOPLASTY  stents    CORONARY ARTERY BYPASS GRAFT N/A 12/12/2018   Procedure: CORONARY ARTERY BYPASS GRAFTING (CABG);  Surgeon: Gardenia Jump, MD;  Location: [facility] OR;  Service: Open Heart Surgery;  Laterality: N/A;   ESOPHAGOGASTRODUODENOSCOPY  04/04/2016   Short-segment Barrett's esophagus (biopsied) Gastric polyps (status post polypectomy x1) Hiatal hernia   HYDROCELE EXCISION Left 07/17/2014   Procedure: LEFT HYDROCELECTOMY;  Surgeon: Willye Harvey, MD;  Location: [facility] ORS;  Service: Urology;  Laterality: Left;   left ankle surgery  1999   LEFT HEART CATH AND CORONARY ANGIOGRAPHY N/A 12/11/2018   Procedure: LEFT HEART CATH AND CORONARY ANGIOGRAPHY;  Surgeon: Arty Binning, MD;  Location: [facility] INVASIVE CV LAB;  Service: Cardiovascular;  Laterality: N/A;   LEFT HEART CATHETERIZATION WITH CORONARY ANGIOGRAM N/A 07/13/2013   Procedure: LEFT HEART CATHETERIZATION WITH CORONARY ANGIOGRAM;  Surgeon: Mickiel Albany, MD;  Location: [facility] CATH LAB;  Service: Cardiovascular;   Laterality: N/A;   PERCUTANEOUS CORONARY STENT INTERVENTION (PCI-S)  07/13/2013   Procedure: PERCUTANEOUS CORONARY STENT INTERVENTION (PCI-S);  Surgeon: Mickiel Albany, MD;  Location: [facility] CATH LAB;  Service: Cardiovascular;;   TEE WITHOUT CARDIOVERSION N/A 12/12/2018   Procedure: TRANSESOPHAGEAL ECHOCARDIOGRAM (TEE);  Surgeon: Gardenia Jump, MD;  Location: [facility] OR;  Service: Open Heart Surgery;  Laterality: N/A;   TONSILLECTOMY      Current Medications: Current Meds  Medication Sig   acetaminophen  (TYLENOL ) 500 MG tablet Take 1 tablet (500 mg total) by mouth every 6 (six) hours as needed for mild pain or headache.   ALPRAZolam  (XANAX ) 0.25 MG tablet Take 0.25 mg by mouth 2 (two) times daily as needed for anxiety.   atorvastatin  (LIPITOR ) 80 MG tablet Take 1 tablet (80 mg total) by mouth every evening.   carbidopa -levodopa  (SINEMET  IR) 25-100 MG tablet Take 1 tablet by mouth 3 (three) times daily.   Cholecalciferol  (VITAMIN D3) 50 MCG (2000 UT) capsule Take 2,000 Units by mouth daily.   clopidogrel  (PLAVIX ) 75 MG tablet TAKE 1 TABLET BY MOUTH EVERY DAY (Patient taking differently: Take 75 mg by mouth daily.)   FLUoxetine (PROZAC) 20 MG capsule Take 20 mg by mouth daily.   furosemide  (LASIX ) 40 MG tablet Take 40 mg by mouth daily.   gabapentin  (NEURONTIN ) 300 MG capsule Take 1 capsule (300 mg total) by mouth daily.   meloxicam (MOBIC) 15 MG tablet Take 15 mg by mouth daily.   methocarbamol (ROBAXIN) 750 MG tablet Take 750 mg by mouth 2 (two) times daily as needed.   mirtazapine (REMERON) 7.5 MG tablet Take 7.5 mg by mouth at bedtime.   Multiple Vitamins-Minerals (ONE-A-DAY MENS 50+) TABS Take 1 tablet by mouth daily with breakfast.   ondansetron  (ZOFRAN ) 4 MG tablet TAKE 1 TABLET (4 MG TOTAL) BY MOUTH EVERY 6 (SIX) HOURS AS NEEDED FOR NAUSEA OR VOMITING   ondansetron  (ZOFRAN -ODT) 4 MG disintegrating tablet Take 4 mg by mouth 4 (four) times daily as needed.   pantoprazole  (PROTONIX ) 40 MG  tablet Take 1 tablet (40 mg total) by mouth daily.   Pirfenidone  (ESBRIET ) 267 MG TABS Take 3 tablets (801 mg total) by mouth 3 (three) times daily with meals.   tiZANidine (ZANAFLEX) 2 MG tablet Take 2 mg by mouth 3 (three) times daily.     Allergies:   Aspirin  and Quinine derivatives   Social History   Socioeconomic History   Marital status: Married    Spouse name: Deatra Face   Number of children: 2  Years of education: 34   Highest education level: Not on file  Occupational History   Occupation: Chiropractor  Tobacco Use   Smoking status: Never   Smokeless tobacco: Never  Vaping Use   Vaping status: Never Used  Substance and Sexual Activity   Alcohol use: Yes    Comment: occasional beer    Drug use: No   Sexual activity: Not Currently  Other Topics Concern   Not on file  Social History Narrative   Lives with wife   Caffeine use: 1-2 cups coffee per day   Right handed   Social Drivers of Corporate investment banker Strain: Not on file  Food Insecurity: Not on file  Transportation Needs: Not on file  Physical Activity: Not on file  Stress: Not on file  Social Connections: Not on file     Family History: The patient's family history includes Heart failure in his father; Hypertension in his mother; Ovarian cancer in his mother. There is no history of Stroke.  ROS:   Please see the history of present illness.    ROS  All other systems reviewed and negative.   EKGs/Labs/Other Studies Reviewed:    The following studies were reviewed today: EKG Interpretation Date/Time:  Monday January 29 2024 15:38:10 EDT Ventricular Rate:  76 PR Interval:  192 QRS Duration:  106 QT Interval:  416 QTC Calculation: 468 R Axis:   -62  Text Interpretation: Sinus rhythm with Premature atrial complexes in a pattern of bigeminy Left axis deviation Left ventricular hypertrophy with repolarization abnormality ( R in aVL , Cornell product , Romhilt-Estes ) Cannot  rule out Septal infarct (cited on or before 29-Jan-2024) When compared with ECG of 17-Nov-2021 19:44, No significant change was found Confirmed by Gaylyn Keas [phone]) on 01/29/2024 3:58:55 PM    Recent Labs: 02/03/2023: ALT 16   Recent Lipid Panel    Component Value Date/Time   CHOL 146 02/03/2023 1538   TRIG 70 02/03/2023 1538   HDL 77 02/03/2023 1538   CHOLHDL 1.9 02/03/2023 1538   CHOLHDL 2.7 09/15/2015 0205   VLDL 22 09/15/2015 0205   LDLCALC 55 02/03/2023 1538    Physical Exam:    VS:  BP (!) 148/76   Pulse 76   Ht 5\' 7"  (1.702 m)   Wt 133 lb 9.6 oz (60.6 kg)   SpO2 99%   BMI 20.92 kg/m     Wt Readings from Last 3 Encounters:  01/29/24 133 lb 9.6 oz (60.6 kg)  07/28/23 145 lb (65.8 kg)  03/15/23 154 lb 3.2 oz (69.9 kg)     GEN: Well nourished, well developed in no acute distress HEENT: Normal NECK: No JVD; No carotid bruits LYMPHATICS: No lymphadenopathy CARDIAC:RRR, no murmurs, rubs, gallops occasional ectopy RESPIRATORY:  crackles at right base ABDOMEN: Soft, non-tender, non-distended MUSCULOSKELETAL:  1+ BLE edema; No deformity  SKIN: Warm and dry NEUROLOGIC:  Alert and oriented x 3 PSYCHIATRIC:  Normal affect  ASSESSMENT:    1. OSA (obstructive sleep apnea)   2. Essential hypertension   3. Coronary artery disease involving native coronary artery of native heart without angina pectoris   4. Hyperlipidemia LDL goal <70     PLAN:    In order of problems listed above:  #OSA  -he stopped using his device because he said it was old and he did not want to fool with it anymore -I reviewed with him the risks of untreated severe OSA including progression of  CAD, CVA, CHF, PHTN and encouraged him to let me order a new CPAP but at this time he does not want to use it.    #HTN - BP is borderline controlled on exam today - increase Lopressor  to 25mg  BID for better BP control  #ASCAD - Status post remote Inf STEMI 07/2013 s/p DES x 2 from ostial to [facility]  07/13/13. Residual Cx disease for med rx (PCI if recurrent pain). - Ultimately s/p CABG with LIMA to LAD, SVG to Diag, Sequential SVG to OM2-OM3, SVG to RCA, EVH via right thigh and leg  - He has not had any angina symptoms but has chronic DOE that is unchanged - Continue drug management with atorvastatin  80 mg daily, Plavix  75 mg daily,BB with as needed refills  #Hyperlipidemia -LDL goal <70 -I have personally reviewed and interpreted outside labs performed by patient's PCP which showed LDL 60, HDL 43 and ALT 5 on 1/20/202 - Continue drug management with atorvastatin  80 mg daily with as needed refills   #New DCM #Acute systolic CHF #SOB -recently had increased SOB and LE edema and saw his PCP and was placed on Lasix  40mg  daily -seen in ER at [facility] and 2D echo was done showing EF 40-45% , mild RVE and mild RV dysfunction, severe LAE, mild MR and AR and possible PFO -will get a Stress PET CT to assess for ischemia and blood flow resolve -Informed Consent   Shared Decision Making/Informed Consent The risks [chest pain, shortness of breath, cardiac arrhythmias, dizziness, blood pressure fluctuations, myocardial infarction, stroke/transient ischemic attack, nausea, vomiting, allergic reaction, radiation exposure, metallic taste sensation and life-threatening complications (estimated to be 1 in 10,000)], benefits (risk stratification, diagnosing coronary artery disease, treatment guidance) and alternatives of a cardiac PET stress test were discussed in detail with Mr. Kabler and he agrees to proceed. -he has some crackles at the right lung base and 1+ LE edema so will increase Lasix  to 40mg  BID for 3 days and then back to 40mg  daily -check BMET today -change Lopressor  to Carvedilol  3.125mg  BID -start Losartan  25mg  daily -followup with me on extender in 3 weeks for uptitration of HF meds -BMET in 1 week     Time Spent: 20 minutes total time of encounter, including 15 minutes spent in  face-to-face patient care on the date of this encounter. This time includes coordination of care and counseling regarding above mentioned problem list. Remainder of non-face-to-face time involved reviewing chart documents/testing relevant to the patient encounter and documentation in the medical record. I have independently reviewed documentation from referring provider  Medication Adjustments/Labs and Tests Ordered: Current medicines are reviewed at length with the patient today.  Concerns regarding medicines are outlined above.  Orders Placed This Encounter  Procedures   EKG 12-Lead   No orders of the defined types were placed in this encounter.   Signed, Gaylyn Keas, MD  01/29/2024 3:56 PM    [facility]

## 2024-01-29 NOTE — Patient Instructions (Signed)
 Medication Instructions:  Your physician has recommended you make the following change in your medication: 1. INCREASE LASIX  TO 40 MG TWICE DAILY FOR THREE DAYS THEN BACK TO 40 MG DAILY  2. STOP LOPRESSOR   3. START COREG  3.125 TWICE DAILY  4. START LOSARTAN  25 MG DAILY  *If you need a refill on your cardiac medications before your next appointment, please call your pharmacy*  Lab Work: TODAY: BMET  TO BE DONE IN 1 WEEK:  BMET If you have labs (blood work) drawn today and your tests are completely normal, you will receive your results only by: MyChart Message (if you have MyChart) OR A paper copy in the mail If you have any lab test that is abnormal or we need to change your treatment, we will call you to review the results.  Testing/Procedures: NONE  Follow-Up: At [facility], you and your health needs are our priority.  As part of our continuing mission to provide you with exceptional heart care, our providers are all part of one team.  This team includes your primary Cardiologist (physician) and Advanced Practice Providers or APPs (Physician Assistants and Nurse Practitioners) who all work together to provide you with the care you need, when you need it.  Your next appointment:   3 week(s)  Provider:   DR. Micael Adas or Lawana Pray, NP, Lovette Rud, PA-C, Charles Connor, NP, Marcie Sever, PA-C, Dayna Dunn, PA-C, Madison Fountain, NP, Callie Goodrich, PA-C, Kathleen Johnson, PA-C, Friddie Jetty, NP, Theotis Flake, PA-C, Hao Meng, PA-C, Marlana Silvan, NP, Marlyse Single, PA-C, Katlyn West, NP, or Leala Prince, PA-C       We recommend signing up for the patient portal called "MyChart".  Sign up information is provided on this After Visit Summary.  MyChart is used to connect with patients for Virtual Visits (Telemedicine).  Patients are able to view lab/test results, encounter notes, upcoming appointments, etc.  Non-urgent messages can be sent to your provider as well.   To learn  more about what you can do with MyChart, go to [email or web address].   Other Instructions       1st Floor: - Lobby - Registration  - Pharmacy  - Lab - Cafe  2nd Floor: - PV Lab - Diagnostic Testing (echo, CT, nuclear med)  3rd Floor: - Vacant  4th Floor: - TCTS (cardiothoracic surgery) - AFib Clinic - Structural Heart Clinic - Vascular Surgery  - Vascular Ultrasound  5th Floor: - HeartCare Cardiology (general and EP) - Clinical Pharmacy for coumadin, hypertension, lipid, weight-loss medications, and med management appointments    Valet parking services will be available as well.

## 2024-01-30 ENCOUNTER — Ambulatory Visit: Payer: Medicare PPO | Admitting: Physician Assistant

## 2024-01-30 ENCOUNTER — Encounter: Payer: Self-pay | Admitting: Physician Assistant

## 2024-01-30 VITALS — BP 145/79 | HR 73 | Resp 20 | Wt 134.0 lb

## 2024-01-30 DIAGNOSIS — F0670 Mild neurocognitive disorder due to known physiological condition without behavioral disturbance: Secondary | ICD-10-CM | POA: Diagnosis not present

## 2024-01-30 DIAGNOSIS — G20A1 Parkinson's disease without dyskinesia, without mention of fluctuations: Secondary | ICD-10-CM

## 2024-01-30 NOTE — Progress Notes (Signed)
 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 Patient is not on antidementia medication at this time.  Memory is stable.  MMSE today is 30/30.  He is able to participate in his ADLs and to drive without difficulty. For tremors he is on carbidopa  levodopa  25-100 mg twice daily, tolerating well.  Symptoms are stable. Mood is stable on Prozac.     Recommendations:   Follow up in 6  months. Recommend good control of cardiovascular risk factors Continue to control mood as per PCP For Parkinson disease, continue carbidopa  levodopa  25 100 mg 3 times a day, side effects discussed    Subjective:   This patient is accompanied in the office by is wife who has advanced dementia,unable to provide any additional history. Previous records as well as any outside records available were reviewed prior to todays visit.   Patient was last seen on 07/28/2023 with MMSE 30/30     Any changes in memory since last visit? "Memory is about the same". He is able to remember recent conversations, information, names of people without any issues.He is caring for his wife throughout the day, not participating in brain stimulation exercises. repeats oneself?  Endorsed Disoriented when walking into a room?  Patient denies    Misplacing objects?  Patient denies   Wandering behavior?   denies   Any personality changes since last visit?   denies   Any worsening depression?: denies.   Hallucinations or paranoia?  Denies.   Seizures?   Denies.    Any sleep changes? Sleeps well Denies vivid dreams, REM behavior or sleepwalking   Sleep apnea?   denies    Any hygiene concerns?   denies   Independent of bathing and dressing?  Endorsed  Does the patient  needs help with medications?  Daughter is in charge   Who is in charge of the finances?  Daughter is in charge     Any changes in appetite?  Denies      Patient have trouble swallowing?  denies   Does the patient cook?  No any kitchen accidents such as leaving the stove on?   denies   Any headaches?    denies   Vision changes? denies Chronic pain?  denies   Ambulates with difficulty?    denies    Recent falls or head injuries?    denies      Unilateral weakness, numbness or tingling?   denies   Any tremors?  Endorsed, worse when he is anxious.  He denies dropping objects or significant drooling.  Denies any shuffling. Tremors are R>L. Any anosmia?    denies   Any incontinence of urine?  He urinates frequently attributing it to drinking too much water. Any bowel dysfunction?  denies      Patient lives with his wife who has dementia    Does the patient drive?  Yes, he denies getting lost   Past Medical History:  Diagnosis Date   Acute ischemic stroke (HCC) 09/15/2015   Ankle fracture, left 2009   rod placed   Ankle fracture, left    rod placed   Anxiety    Aspirin  allergy    a. Anaphylactic rxn.   Barrett's esophagus  CAD (coronary artery disease)    a. Inf STEMI 07/2013: s/p DES x 2 from ostial to [facility] 07/13/13. Residual Cx disease for med rx (PCI if recurrent pain).   Controlled gout    Dyslipidemia    ED (erectile dysfunction)    Edema of both legs    Gastric polyp    GERD (gastroesophageal reflux disease)    Hiatal hernia    History of nuclear stress test    Nuclear stress test 5/19: EF 53, no ischemia, no infarction; Low Risk   Hydrocele of testis    Hyperglycemia    a. A1C 5.8 in 07/2013.   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   LIMA to LAD, SVG to Diag,  Sequential SVG to OM2-OM3, SVG to RCA, EVH via right thigh and leg   Unstable angina [facility])      Past Surgical History:  Procedure Laterality Date   BACK SURGERY     L1 kyphoplasty by Dr Gwendlyn Lemmings 03/2018     thoractic 02-2018   cataract surgery      bilateral    COLONOSCOPY     CORONARY ANGIOPLASTY     stents    CORONARY ARTERY BYPASS GRAFT N/A 12/12/2018   Procedure: CORONARY ARTERY BYPASS GRAFTING (CABG);  Surgeon: Gardenia Jump, MD;  Location: [facility] OR;  Service: Open Heart Surgery;  Laterality: N/A;   ESOPHAGOGASTRODUODENOSCOPY  04/04/2016   Short-segment Barrett's esophagus (biopsied) Gastric polyps (status post polypectomy x1) Hiatal hernia   HYDROCELE EXCISION Left 07/17/2014   Procedure: LEFT HYDROCELECTOMY;  Surgeon: Willye Harvey, MD;  Location: [facility] ORS;  Service: Urology;  Laterality: Left;   left ankle surgery  1999   LEFT HEART CATH AND CORONARY ANGIOGRAPHY N/A 12/11/2018   Procedure: LEFT HEART CATH AND CORONARY ANGIOGRAPHY;  Surgeon: Arty Binning, MD;  Location: [facility] INVASIVE CV LAB;  Service: Cardiovascular;  Laterality: N/A;   LEFT HEART CATHETERIZATION WITH CORONARY ANGIOGRAM N/A 07/13/2013   Procedure: LEFT HEART CATHETERIZATION WITH CORONARY ANGIOGRAM;  Surgeon: Mickiel Albany, MD;  Location: [facility] CATH LAB;  Service: Cardiovascular;  Laterality: N/A;   PERCUTANEOUS CORONARY STENT INTERVENTION (PCI-S)  07/13/2013   Procedure: PERCUTANEOUS CORONARY STENT INTERVENTION (PCI-S);  Surgeon: Mickiel Albany, MD;  Location: [facility] CATH LAB;  Service: Cardiovascular;;   TEE WITHOUT CARDIOVERSION N/A 12/12/2018   Procedure: TRANSESOPHAGEAL ECHOCARDIOGRAM (TEE);  Surgeon: Gardenia Jump, MD;  Location: [facility] OR;  Service: Open Heart Surgery;  Laterality: N/A;   TONSILLECTOMY       PREVIOUS MEDICATIONS:   CURRENT MEDICATIONS:  Outpatient Encounter Medications as of 01/30/2024  Medication Sig   acetaminophen  (TYLENOL ) 500 MG tablet Take 1 tablet (500 mg total) by mouth every 6 (six) hours as  needed for mild pain or headache.   ALPRAZolam  (XANAX ) 0.25 MG tablet Take 0.25 mg by mouth 2 (two) times daily as needed for anxiety.   atorvastatin  (LIPITOR ) 80 MG tablet Take 1 tablet (80 mg total) by mouth every evening.   carbidopa -levodopa  (SINEMET  IR) 25-100 MG tablet Take 1 tablet by mouth 3 (three) times daily.   carvedilol  (COREG ) 3.125 MG tablet Take 1 tablet (3.125 mg total) by mouth 2 (two) times daily with a meal.   Cholecalciferol  (VITAMIN  D3) 50 MCG (2000 UT) capsule Take 2,000 Units by mouth daily.   clopidogrel  (PLAVIX ) 75 MG tablet TAKE 1 TABLET BY MOUTH EVERY DAY (Patient taking differently: Take 75 mg by mouth daily.)   FLUoxetine (PROZAC) 20 MG capsule Take 20 mg by mouth daily.   furosemide  (LASIX ) 40 MG tablet Take 40 mg by mouth daily.   gabapentin  (NEURONTIN ) 300 MG capsule Take 1 capsule (300 mg total) by mouth daily.   losartan  (COZAAR ) 25 MG tablet Take 1 tablet (25 mg total) by mouth daily.   meloxicam (MOBIC) 15 MG tablet Take 15 mg by mouth daily.   methocarbamol (ROBAXIN) 750 MG tablet Take 750 mg by mouth 2 (two) times daily as needed.   mirtazapine (REMERON) 7.5 MG tablet Take 7.5 mg by mouth at bedtime.   Multiple Vitamins-Minerals (ONE-A-DAY MENS 50+) TABS Take 1 tablet by mouth daily with breakfast.   ondansetron  (ZOFRAN ) 4 MG tablet TAKE 1 TABLET (4 MG TOTAL) BY MOUTH EVERY 6 (SIX) HOURS AS NEEDED FOR NAUSEA OR VOMITING   ondansetron  (ZOFRAN -ODT) 4 MG disintegrating tablet Take 4 mg by mouth 4 (four) times daily as needed.   pantoprazole  (PROTONIX ) 40 MG tablet Take 1 tablet (40 mg total) by mouth daily.   Pirfenidone  (ESBRIET ) 267 MG TABS Take 3 tablets (801 mg total) by mouth 3 (three) times daily with meals.   tiZANidine (ZANAFLEX) 2 MG tablet Take 2 mg by mouth 3 (three) times daily.   No facility-administered encounter medications on file as of 01/30/2024.     Objective:     PHYSICAL EXAMINATION:    VITALS:   Vitals:   01/30/24 1458  BP: (!)  145/79  Pulse: 73  Resp: 20  SpO2: 98%  Weight: 134 lb (60.8 kg)    GEN:  The patient appears stated age and is in NAD. HEENT:  Normocephalic, atraumatic.   Neurological examination:  General: NAD, well-groomed, appears stated age. Orientation: The patient is alert. Oriented to person, place and date Cranial nerves: There is good facial symmetry.hypomimia is noted.  The speech is fluent and clear, tangential at times, mild hypophonia.  He reads lips due to decreased hearing L>R. No aphasia or dysarthria. Fund of knowledge is appropriate. Recent memory impaired and remote memory is normal.  Attention and concentration are normal.  Able to name objects and repeat phrases.  Hearing is reduced to conversational tone.   Delayed recall 3/3 Sensation: Sensation is intact to light touch throughout Motor: Strength is at least antigravity x4. DTR's 2/4 in UE/LE      03/18/2021    3:00 PM  Montreal Cognitive Assessment   Visuospatial/ Executive (0/5) 3  Naming (0/3) 3  Attention: Read list of digits (0/2) 2  Attention: Read list of letters (0/1) 1  Attention: Serial 7 subtraction starting at 100 (0/3) 3  Language: Repeat phrase (0/2) 1  Language : Fluency (0/1) 0  Abstraction (0/2) 0  Delayed Recall (0/5) 3  Orientation (0/6) 5  Total 21  Adjusted Score (based on education) 22       01/30/2024    3:00 PM 07/29/2023    6:00 PM 01/26/2023    3:00 PM  MMSE - Mini Mental State Exam  Orientation to time 5 5 4   Orientation to Place 5 5 5   Registration 3 3 3   Attention/ Calculation 5 5 5   Recall 3 3 3   Language- name 2 objects 2 2 2   Language- repeat 1 1 1   Language- follow  3 step command 3 3 3   Language- read & follow direction 1 1 1   Write a sentence 1 1 1   Copy design 1 1 1   Total score 30 30 29        Movement examination: Tone: There is normal tone in the UE/LE. Mild cogwheeling in wrist and elbows R>L. Abnormal movements: Mild intention bilateral tremor, not at rest   No  myoclonus.  No asterixis.   Coordination:  There is no decremation with RAM's. Normal finger to nose  Gait and Station: The patient has no difficulty arising out of a deep-seated chair without the use of the hands. The patient's stride length is good.  Mild decreased arm swing.  Gait is cautious and narrow.   Thank you for allowing us  the opportunity to participate in the care of this nice patient. Please do not hesitate to contact us  for any questions or concerns.   Total time spent on today's visit was 20 minutes dedicated to this patient today, preparing to see patient, examining the patient, ordering tests and/or medications and counseling the patient, documenting clinical information in the EHR or other health record, independently interpreting results and communicating results to the patient/family, discussing treatment and goals, answering patient's questions and coordinating care.  Cc:  Adrian Hopper, MD  Tex Filbert 01/30/2024 3:35 PM

## 2024-01-30 NOTE — Patient Instructions (Signed)
It was a pleasure to see you today at our office. Everything is stable no new findings   Recommendations:  Follow up in  6 months Continue Carbidopa Levodopa as prescribed, no changes  Continue with your other medicines  Use your hearing aids!!!    RECOMMENDATIONS FOR ALL PATIENTS WITH MEMORY PROBLEMS: 1. Continue to exercise (Recommend 30 minutes of walking everyday, or 3 hours every week) 2. Increase social interactions - continue going to Church and enjoy social gatherings with friends and family 3. Eat healthy, avoid fried foods and eat more fruits and vegetables 4. Maintain adequate blood pressure, blood sugar, and blood cholesterol level. Reducing the risk of stroke and cardiovascular disease also helps promoting better memory. 5. Avoid stressful situations. Live a simple life and avoid aggravations. Organize your time and prepare for the next day in anticipation. 6. Sleep well, avoid any interruptions of sleep and avoid any distractions in the bedroom that may interfere with adequate sleep quality 7. Avoid sugar, avoid sweets as there is a strong link between excessive sugar intake, diabetes, and cognitive impairment We discussed the Mediterranean diet, which has been shown to help patients reduce the risk of progressive memory disorders and reduces cardiovascular risk. This includes eating fish, eat fruits and green leafy vegetables, nuts like almonds and hazelnuts, walnuts, and also use olive oil. Avoid fast foods and fried foods as much as possible. Avoid sweets and sugar as sugar use has been linked to worsening of memory function.  There is always a concern of gradual progression of memory problems. If this is the case, then we may need to adjust level of care according to patient needs. Support, both to the patient and caregiver, should then be put into place.    FALL PRECAUTIONS: Be cautious when walking. Scan the area for obstacles that may increase the risk of trips and falls.  When getting up in the mornings, sit up at the edge of the bed for a few minutes before getting out of bed. Consider elevating the bed at the head end to avoid drop of blood pressure when getting up. Walk always in a well-lit room (use night lights in the walls). Avoid area rugs or power cords from appliances in the middle of the walkways. Use a walker or a cane if necessary and consider physical therapy for balance exercise. Get your eyesight checked regularly.  FINANCIAL OVERSIGHT: Supervision, especially oversight when making financial decisions or transactions is also recommended.  HOME SAFETY: Consider the safety of the kitchen when operating appliances like stoves, microwave oven, and blender. Consider having supervision and share cooking responsibilities until no longer able to participate in those. Accidents with firearms and other hazards in the house should be identified and addressed as well.   ABILITY TO BE LEFT ALONE: If patient is unable to contact 911 operator, consider using LifeLine, or when the need is there, arrange for someone to stay with patients. Smoking is a fire hazard, consider supervision or cessation. Risk of wandering should be assessed by caregiver and if detected at any point, supervision and safe proof recommendations should be instituted.  MEDICATION SUPERVISION: Inability to self-administer medication needs to be constantly addressed. Implement a mechanism to ensure safe administration of the medications.   DRIVING: Regarding driving, in patients with progressive memory problems, driving will be impaired. We advise to have someone else do the driving if trouble finding directions or if minor accidents are reported. Independent driving assessment is available to determine safety of driving.     If you are interested in the driving assessment, you can contact the following:  [employer] in [place] [phone]  [facility]  [phone]  [facility] [phone]  [facility] [phone] or [phone]   

## 2024-02-05 DIAGNOSIS — R7301 Impaired fasting glucose: Secondary | ICD-10-CM | POA: Diagnosis not present

## 2024-02-05 DIAGNOSIS — E785 Hyperlipidemia, unspecified: Secondary | ICD-10-CM | POA: Diagnosis not present

## 2024-02-07 DIAGNOSIS — I2510 Atherosclerotic heart disease of native coronary artery without angina pectoris: Secondary | ICD-10-CM | POA: Diagnosis not present

## 2024-02-07 DIAGNOSIS — I10 Essential (primary) hypertension: Secondary | ICD-10-CM | POA: Diagnosis not present

## 2024-02-07 DIAGNOSIS — I5022 Chronic systolic (congestive) heart failure: Secondary | ICD-10-CM | POA: Diagnosis not present

## 2024-02-07 DIAGNOSIS — I6990 Unspecified sequelae of unspecified cerebrovascular disease: Secondary | ICD-10-CM | POA: Diagnosis not present

## 2024-02-07 DIAGNOSIS — F331 Major depressive disorder, recurrent, moderate: Secondary | ICD-10-CM | POA: Diagnosis not present

## 2024-02-07 DIAGNOSIS — R7301 Impaired fasting glucose: Secondary | ICD-10-CM | POA: Diagnosis not present

## 2024-02-07 DIAGNOSIS — E785 Hyperlipidemia, unspecified: Secondary | ICD-10-CM | POA: Diagnosis not present

## 2024-02-07 DIAGNOSIS — Z9861 Coronary angioplasty status: Secondary | ICD-10-CM | POA: Diagnosis not present

## 2024-02-14 NOTE — Progress Notes (Deleted)
 Cardiology Office Note:    Date:  02/14/2024   ID:  Matthew Liu, DOB 26-May-1941, MRN [id number]  PCP:  Adrian Hopper, MD  Cardiologist:  Gaylyn Keas, MD { Click to update primary MD,subspecialty MD or APP then REFRESH:1}    Referring MD: Adrian Hopper, MD   Chief Complaint: follow-up of CHF  History of Present Illness:    Matthew Liu is a 83 y.o. 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 Micael Adas and presents today for follow-up of CHF.   Patient was previously followed by Dr. Felipe Horton and now follows with Dr. Micael Adas. He has a history of inferior STEMI in 07/2013 and underwent PCI with DES x2 to ostial to mid RCA. Cath at that time also showed some residual LCX disease which was treated medically. Repeat LHC in 12/2018 for evaluation of unstable angina showed severe multivessel disease severe distal left main, ostial LCX, and ostial LAD disease. He underwent CABG x5 with LIMA to LAD, SVG to Diag, sequential SVG to OM2 and OM3, and SVG to RCA. Echo at that time showed LVEF of 60-65% with normal wall motion, normal RV function, and mild AI. She  has a history of PAD. CTA in 08/2022 showed a 3.5cm infrarenal AAA and diffuse atherosclerotic changes noted throughout bilateral lower extremities without focal hemodynamically significant stenosis and stable atherosclerotic changes noted in the infrapopliteal vessels bilaterally. ABIs in 09/2022 were  non-compressible bilaterally. PAD is followed by Vascular Surgery.  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 He follows with Pulmonology for this.  Patient had a split night sleep study in 05/2021 which showed severe sleep apnea. He was started on CPAP but unfortunately stopped using this because he had a lot of problems with the machine malfunctioning and did not want to retry this.   Patient was seen at [facility] in 01/2024 with acute CHF after presenting with shortness of breath and lower extremity edema. Echo showed LVEF of 40-45% with global hypokinesis, mild RV dysfunction, severe left atrial enlargement, mild AI, mild MR, and a small interatrial septal defect with left to right flow. He was started on Lasix .   He was was last seen by Dr. Micael Adas on 01/29/2024 for follow-up at which time he was doing well and significant reduced his salt intake. Cardiac PET stress test was recommended for further evaluation of cardiomyopathy. Lasix  was increased for 3 days and he was started on Coreg  and Losartan .   Patient presents today for follow-up. ***  CAD s/p CABG History of inferior STEMI in 07/2013 s/p DES x2 to RCA and subsequent CABG x5 in 12/2018.  - No chest pain.  - Continue Plavix  monotherapy.  - Continue Lipitor  80mg  daily.   Chronic HFmrEF Recent Echo in 01/2024 at [facility] showed LVEF of 40-45% with global hypokinesis, mild RV dysfunction, severe left atrial enlargement, mild AI, mild MR, and a small interatrial septal defect with left to right flow. - Euvolemic on exam. *** -  Continue Lasix  40mg  daily.  - Continue Losartan  25mg  daily.  - Continue Coreg  3.125mg  twice daily.  - Spironolactone or SLGT2 inhibitor  - Discussed importance of daily weights, strict I/Os, and renal function.  - Will check BMET today. ***  PAD CTA in 08/2022 showed diffuse atherosclerotic changes noted throughout bilateral lower extremities  without focal hemodynamically significant stenosis and stable atherosclerotic changes noted in the infrapopliteal vessels bilaterally. ABIs in 09/2022 were non-compressible bilaterally. - Continue Plavix  and Lipitor  as above. - Followed by Vascular Surgery.   AAA CTA in 08/2022 showed 3.5cm infrarenal AAA. - Followed by Vascular Surgery.   Hypertension BP well controlled. *** - Continue medications for CHF as above.  Hyperlipidemia Lipid panel in 01/2023: Total Cholesterol 146, Triglycerides 70, HDL 77, LDL 55.  - Continue Lipitor  80mg  daily.  - Will repeat lipid panel and LFTs. ***  Obstructive Sleep Apnea Previously on CPAP but stopped using this because he had a lot of problems with the machine malfunctioning and did not want to retry this.    EKGs/Labs/Other Studies Reviewed:    The following studies were reviewed:  Left Cardiac Catheterization 12/11/2018: Thrombotic/bulky calcified distal left main greater than 90%. 99% ostial circumflex.  Mid to distal circumflex 95%. Ostial LAD 99%.  Mid LAD beyond the first large diagonal 90%.  First diagonal ostium to proximal 50 to 60% RCA is heavily calcified from just inside the ostium to the mid vessel with generalized 25% in-stent restenosis.  Ostium contains 50 to 60% narrowing.  No distal disease is noted in the RCA. Normal systolic function.  LVEDP 16 mmHg.   Recommendations: The patient has been chronically on Plavix  because he is allergic to aspirin .  He took Plavix  this morning.  Plavix  will be discontinued and a P2 Y 12 assay will be done. Hospitalized and start IV heparin  and nitroglycerin  as the patient is having multiple episodes of angina daily with minimal activity and an occasional episode at rest.  Currently pain-free. Continue high intensity statin therapy. TCTS consultation for surgery when appropriate based upon clinical judgment and the patient's status. Diagnostic Dominance:  Right  _______________  Aortobifemoral CTA 09/04/2022: Impression: VASCULAR - 3.5 cm infrarenal abdominal aortic aneurysm. Recommend follow-up every 2 years.Reference: J Am Coll Radiol 2013;10:789-794.  - Diffuse atherosclerotic changes are noted throughout the lower extremities bilaterally without focal hemodynamically significant stenosis.  - Stable atherosclerotic changes are noted in the infrapopliteal vessels bilaterally although 2 vessel runoff to the right ankle is noted with three-vessel runoff to the left ankle identified. No acute arterial abnormality is noted.   NON-VASCULAR  - Cholelithiasis without complicating factors.  - Diverticulosis without diverticulitis.  - No acute nonvascular abnormality is seen. _______________  ABIs/ TBIs 09/12/2022: Summary: Right: Resting right ankle-brachial index indicates noncompressible right  lower extremity arteries with biphasic waveforms.  The right great toe is non-compressible.   Left: Resting left ankle-brachial index indicates noncompressible left  lower extremity arteries with triphasic waveforms. The left toe-brachial  index is normal.  _______________  Echocardiogram 01/19/2024: Summary: - LVEF of 40-45% with global hypokinesis - RV slightly enlarged with mildly reduced function - Severe left atrial enlargement - Mild mitral regurgitation - Mild aortic regurgitation  - Small interatrial septal defect with left to right flow   EKG:  EKG not ordered today.  Recent Labs: No results found for requested labs within last 365 days.  Recent Lipid Panel    Component Value Date/Time   CHOL 146 02/03/2023 1538  TRIG 70 02/03/2023 1538   HDL 77 02/03/2023 1538   CHOLHDL 1.9 02/03/2023 1538   CHOLHDL 2.7 09/15/2015 0205   VLDL 22 09/15/2015 0205   LDLCALC 55 02/03/2023 1538    Physical Exam:    Vital Signs: There were no vitals taken for this visit.    Wt Readings from Last 3 Encounters:  01/30/24 134 lb  (60.8 kg)  01/29/24 133 lb 9.6 oz (60.6 kg)  07/28/23 145 lb (65.8 kg)     General: 83 y.o. male in no acute distress. HEENT: Normocephalic and atraumatic. Sclera clear.  Neck: Supple. No carotid bruits. No JVD. Heart: *** RRR. Distinct S1 and S2. No murmurs, gallops, or rubs.  Lungs: No increased work of breathing. Clear to ausculation bilaterally. No wheezes, rhonchi, or rales.  Abdomen: Soft, non-distended, and non-tender to palpation.  Extremities: No lower extremity edema.  Radial and distal pedal pulses 2+ and equal bilaterally. Skin: Warm and dry. Neuro: No focal deficits. Psych: Normal affect. Responds appropriately.   Assessment:    No diagnosis found.  Plan:     Disposition: Follow up in ***   Signed, Casimer Clear, PA-C  02/14/2024 4:02 PM    [facility]

## 2024-02-18 ENCOUNTER — Inpatient Hospital Stay (HOSPITAL_COMMUNITY)

## 2024-02-18 ENCOUNTER — Encounter (HOSPITAL_COMMUNITY): Payer: Self-pay

## 2024-02-18 ENCOUNTER — Emergency Department (HOSPITAL_COMMUNITY)

## 2024-02-18 ENCOUNTER — Inpatient Hospital Stay (HOSPITAL_COMMUNITY)
Admission: EM | Admit: 2024-02-18 | Discharge: 2024-02-23 | DRG: 481 | Disposition: A | Discharge status: Skilled Nursing Facility | Attending: Internal Medicine | Admitting: Internal Medicine

## 2024-02-18 ENCOUNTER — Other Ambulatory Visit: Payer: Self-pay

## 2024-02-18 DIAGNOSIS — J84112 Idiopathic pulmonary fibrosis: Secondary | ICD-10-CM | POA: Diagnosis present

## 2024-02-18 DIAGNOSIS — D62 Acute posthemorrhagic anemia: Secondary | ICD-10-CM | POA: Diagnosis not present

## 2024-02-18 DIAGNOSIS — Z791 Long term (current) use of non-steroidal anti-inflammatories (NSAID): Secondary | ICD-10-CM

## 2024-02-18 DIAGNOSIS — Z955 Presence of coronary angioplasty implant and graft: Secondary | ICD-10-CM

## 2024-02-18 DIAGNOSIS — S72002A Fracture of unspecified part of neck of left femur, initial encounter for closed fracture: Secondary | ICD-10-CM

## 2024-02-18 DIAGNOSIS — I10 Essential (primary) hypertension: Secondary | ICD-10-CM | POA: Diagnosis present

## 2024-02-18 DIAGNOSIS — S0990XA Unspecified injury of head, initial encounter: Secondary | ICD-10-CM | POA: Diagnosis not present

## 2024-02-18 DIAGNOSIS — I5022 Chronic systolic (congestive) heart failure: Secondary | ICD-10-CM | POA: Diagnosis present

## 2024-02-18 DIAGNOSIS — Z7902 Long term (current) use of antithrombotics/antiplatelets: Secondary | ICD-10-CM

## 2024-02-18 DIAGNOSIS — Z8249 Family history of ischemic heart disease and other diseases of the circulatory system: Secondary | ICD-10-CM

## 2024-02-18 DIAGNOSIS — S72142A Displaced intertrochanteric fracture of left femur, initial encounter for closed fracture: Principal | ICD-10-CM | POA: Diagnosis present

## 2024-02-18 DIAGNOSIS — G20A1 Parkinson's disease without dyskinesia, without mention of fluctuations: Secondary | ICD-10-CM | POA: Diagnosis not present

## 2024-02-18 DIAGNOSIS — D631 Anemia in chronic kidney disease: Secondary | ICD-10-CM | POA: Diagnosis not present

## 2024-02-18 DIAGNOSIS — Z8673 Personal history of transient ischemic attack (TIA), and cerebral infarction without residual deficits: Secondary | ICD-10-CM

## 2024-02-18 DIAGNOSIS — Z886 Allergy status to analgesic agent status: Secondary | ICD-10-CM

## 2024-02-18 DIAGNOSIS — I2510 Atherosclerotic heart disease of native coronary artery without angina pectoris: Secondary | ICD-10-CM | POA: Diagnosis not present

## 2024-02-18 DIAGNOSIS — E785 Hyperlipidemia, unspecified: Secondary | ICD-10-CM | POA: Diagnosis present

## 2024-02-18 DIAGNOSIS — S72102A Unspecified trochanteric fracture of left femur, initial encounter for closed fracture: Secondary | ICD-10-CM | POA: Diagnosis not present

## 2024-02-18 DIAGNOSIS — Y92000 Kitchen of unspecified non-institutional (private) residence as  the place of occurrence of the external cause: Secondary | ICD-10-CM | POA: Diagnosis not present

## 2024-02-18 DIAGNOSIS — E1122 Type 2 diabetes mellitus with diabetic chronic kidney disease: Secondary | ICD-10-CM | POA: Diagnosis present

## 2024-02-18 DIAGNOSIS — S7290XA Unspecified fracture of unspecified femur, initial encounter for closed fracture: Secondary | ICD-10-CM | POA: Diagnosis not present

## 2024-02-18 DIAGNOSIS — M1612 Unilateral primary osteoarthritis, left hip: Secondary | ICD-10-CM | POA: Diagnosis not present

## 2024-02-18 DIAGNOSIS — G4733 Obstructive sleep apnea (adult) (pediatric): Secondary | ICD-10-CM | POA: Diagnosis present

## 2024-02-18 DIAGNOSIS — I700 Atherosclerosis of aorta: Secondary | ICD-10-CM | POA: Diagnosis not present

## 2024-02-18 DIAGNOSIS — I252 Old myocardial infarction: Secondary | ICD-10-CM

## 2024-02-18 DIAGNOSIS — R000 Tachycardia, unspecified: Secondary | ICD-10-CM | POA: Diagnosis not present

## 2024-02-18 DIAGNOSIS — S61511A Laceration without foreign body of right wrist, initial encounter: Secondary | ICD-10-CM | POA: Diagnosis present

## 2024-02-18 DIAGNOSIS — N1831 Chronic kidney disease, stage 3a: Secondary | ICD-10-CM | POA: Diagnosis not present

## 2024-02-18 DIAGNOSIS — E1151 Type 2 diabetes mellitus with diabetic peripheral angiopathy without gangrene: Secondary | ICD-10-CM | POA: Diagnosis present

## 2024-02-18 DIAGNOSIS — S7292XA Unspecified fracture of left femur, initial encounter for closed fracture: Secondary | ICD-10-CM | POA: Diagnosis not present

## 2024-02-18 DIAGNOSIS — S7222XA Displaced subtrochanteric fracture of left femur, initial encounter for closed fracture: Secondary | ICD-10-CM | POA: Diagnosis not present

## 2024-02-18 DIAGNOSIS — M85852 Other specified disorders of bone density and structure, left thigh: Secondary | ICD-10-CM | POA: Diagnosis not present

## 2024-02-18 DIAGNOSIS — S7292XD Unspecified fracture of left femur, subsequent encounter for closed fracture with routine healing: Secondary | ICD-10-CM | POA: Diagnosis not present

## 2024-02-18 DIAGNOSIS — I420 Dilated cardiomyopathy: Secondary | ICD-10-CM | POA: Diagnosis present

## 2024-02-18 DIAGNOSIS — W010XXA Fall on same level from slipping, tripping and stumbling without subsequent striking against object, initial encounter: Secondary | ICD-10-CM | POA: Diagnosis present

## 2024-02-18 DIAGNOSIS — I517 Cardiomegaly: Secondary | ICD-10-CM | POA: Diagnosis not present

## 2024-02-18 DIAGNOSIS — D638 Anemia in other chronic diseases classified elsewhere: Secondary | ICD-10-CM

## 2024-02-18 DIAGNOSIS — S72002D Fracture of unspecified part of neck of left femur, subsequent encounter for closed fracture with routine healing: Secondary | ICD-10-CM | POA: Diagnosis not present

## 2024-02-18 DIAGNOSIS — I25110 Atherosclerotic heart disease of native coronary artery with unstable angina pectoris: Secondary | ICD-10-CM | POA: Diagnosis present

## 2024-02-18 DIAGNOSIS — S12600A Unspecified displaced fracture of seventh cervical vertebra, initial encounter for closed fracture: Secondary | ICD-10-CM | POA: Diagnosis not present

## 2024-02-18 DIAGNOSIS — S72009A Fracture of unspecified part of neck of unspecified femur, initial encounter for closed fracture: Secondary | ICD-10-CM | POA: Diagnosis not present

## 2024-02-18 DIAGNOSIS — I440 Atrioventricular block, first degree: Secondary | ICD-10-CM | POA: Diagnosis present

## 2024-02-18 DIAGNOSIS — I4719 Other supraventricular tachycardia: Secondary | ICD-10-CM | POA: Diagnosis present

## 2024-02-18 DIAGNOSIS — Z79899 Other long term (current) drug therapy: Secondary | ICD-10-CM

## 2024-02-18 DIAGNOSIS — M25462 Effusion, left knee: Secondary | ICD-10-CM | POA: Diagnosis not present

## 2024-02-18 DIAGNOSIS — K219 Gastro-esophageal reflux disease without esophagitis: Secondary | ICD-10-CM | POA: Diagnosis present

## 2024-02-18 DIAGNOSIS — Z043 Encounter for examination and observation following other accident: Secondary | ICD-10-CM | POA: Diagnosis not present

## 2024-02-18 DIAGNOSIS — W19XXXA Unspecified fall, initial encounter: Principal | ICD-10-CM

## 2024-02-18 DIAGNOSIS — G319 Degenerative disease of nervous system, unspecified: Secondary | ICD-10-CM | POA: Diagnosis not present

## 2024-02-18 DIAGNOSIS — Z8041 Family history of malignant neoplasm of ovary: Secondary | ICD-10-CM

## 2024-02-18 DIAGNOSIS — Z951 Presence of aortocoronary bypass graft: Secondary | ICD-10-CM | POA: Diagnosis not present

## 2024-02-18 DIAGNOSIS — S7222XD Displaced subtrochanteric fracture of left femur, subsequent encounter for closed fracture with routine healing: Secondary | ICD-10-CM | POA: Diagnosis not present

## 2024-02-18 DIAGNOSIS — I130 Hypertensive heart and chronic kidney disease with heart failure and stage 1 through stage 4 chronic kidney disease, or unspecified chronic kidney disease: Secondary | ICD-10-CM | POA: Diagnosis present

## 2024-02-18 DIAGNOSIS — I6782 Cerebral ischemia: Secondary | ICD-10-CM | POA: Diagnosis not present

## 2024-02-18 DIAGNOSIS — S80919A Unspecified superficial injury of unspecified knee, initial encounter: Secondary | ICD-10-CM | POA: Diagnosis not present

## 2024-02-18 DIAGNOSIS — M8580 Other specified disorders of bone density and structure, unspecified site: Secondary | ICD-10-CM | POA: Diagnosis not present

## 2024-02-18 DIAGNOSIS — R54 Age-related physical debility: Secondary | ICD-10-CM | POA: Diagnosis present

## 2024-02-18 DIAGNOSIS — K5730 Diverticulosis of large intestine without perforation or abscess without bleeding: Secondary | ICD-10-CM | POA: Diagnosis not present

## 2024-02-18 DIAGNOSIS — F0670 Mild neurocognitive disorder due to known physiological condition without behavioral disturbance: Secondary | ICD-10-CM | POA: Diagnosis not present

## 2024-02-18 DIAGNOSIS — R58 Hemorrhage, not elsewhere classified: Secondary | ICD-10-CM | POA: Diagnosis not present

## 2024-02-18 DIAGNOSIS — S7225XA Nondisplaced subtrochanteric fracture of left femur, initial encounter for closed fracture: Secondary | ICD-10-CM | POA: Diagnosis not present

## 2024-02-18 LAB — CBC WITH DIFFERENTIAL/PLATELET
Abs Immature Granulocytes: 0.05 10*3/uL (ref 0.00–0.07)
Basophils Absolute: 0 10*3/uL (ref 0.0–0.1)
Basophils Relative: 0 %
Eosinophils Absolute: 0.2 10*3/uL (ref 0.0–0.5)
Eosinophils Relative: 2 %
HCT: 32 % — ABNORMAL LOW (ref 39.0–52.0)
Hemoglobin: 11.2 g/dL — ABNORMAL LOW (ref 13.0–17.0)
Immature Granulocytes: 1 %
Lymphocytes Relative: 11 %
Lymphs Abs: 1.2 10*3/uL (ref 0.7–4.0)
MCH: 34.7 pg — ABNORMAL HIGH (ref 26.0–34.0)
MCHC: 35 g/dL (ref 30.0–36.0)
MCV: 99.1 fL (ref 80.0–100.0)
Monocytes Absolute: 0.7 10*3/uL (ref 0.1–1.0)
Monocytes Relative: 6 %
Neutro Abs: 8.3 10*3/uL — ABNORMAL HIGH (ref 1.7–7.7)
Neutrophils Relative %: 80 %
Platelets: 153 10*3/uL (ref 150–400)
RBC: 3.23 MIL/uL — ABNORMAL LOW (ref 4.22–5.81)
RDW: 12.8 % (ref 11.5–15.5)
WBC: 10.4 10*3/uL (ref 4.0–10.5)
nRBC: 0 % (ref 0.0–0.2)

## 2024-02-18 LAB — BASIC METABOLIC PANEL WITH GFR
Anion gap: 12 (ref 5–15)
BUN: 41 mg/dL — ABNORMAL HIGH (ref 8–23)
CO2: 22 mmol/L (ref 22–32)
Calcium: 8.8 mg/dL — ABNORMAL LOW (ref 8.9–10.3)
Chloride: 101 mmol/L (ref 98–111)
Creatinine, Ser: 1.22 mg/dL (ref 0.61–1.24)
GFR, Estimated: 59 mL/min — ABNORMAL LOW (ref >60)
Glucose, Bld: 164 mg/dL — ABNORMAL HIGH (ref 70–99)
Potassium: 3.5 mmol/L (ref 3.5–5.1)
Sodium: 135 mmol/L (ref 135–145)

## 2024-02-18 LAB — IRON AND TIBC
Iron: 56 ug/dL (ref 45–182)
Saturation Ratios: 20 % (ref 17.9–39.5)
TIBC: 274 ug/dL (ref 250–450)
UIBC: 218 ug/dL

## 2024-02-18 LAB — PROTIME-INR
INR: 1.1 (ref 0.8–1.2)
Prothrombin Time: 14.7 s (ref 11.4–15.2)

## 2024-02-18 LAB — FERRITIN: Ferritin: 130 ng/mL (ref 24–336)

## 2024-02-18 MED ORDER — HYDROMORPHONE HCL 1 MG/ML IJ SOLN
1.0000 mg | INTRAMUSCULAR | Status: DC | PRN
  Administered 2024-02-18 – 2024-02-19 (×2): 1 mg via INTRAVENOUS
  Filled 2024-02-18 (×2): qty 1

## 2024-02-18 MED ORDER — LOSARTAN POTASSIUM 50 MG PO TABS: 25.0000 mg | ORAL_TABLET | Freq: Every day | ORAL | Status: DC

## 2024-02-18 MED ORDER — SENNOSIDES-DOCUSATE SODIUM 8.6-50 MG PO TABS: 1.0000 | ORAL_TABLET | Freq: Every evening | ORAL | Status: DC | PRN

## 2024-02-18 MED ORDER — ACETAMINOPHEN 500 MG PO TABS
1000.0000 mg | ORAL_TABLET | Freq: Three times a day (TID) | ORAL | Status: DC
  Administered 2024-02-18 – 2024-02-23 (×14): 1000 mg via ORAL
  Filled 2024-02-18 (×16): qty 2

## 2024-02-18 MED ORDER — LOSARTAN POTASSIUM 50 MG PO TABS
25.0000 mg | ORAL_TABLET | Freq: Once | ORAL | Status: AC
  Administered 2024-02-18: 25 mg via ORAL
  Filled 2024-02-18: qty 1

## 2024-02-18 MED ORDER — ENOXAPARIN SODIUM 40 MG/0.4ML IJ SOSY
40.0000 mg | PREFILLED_SYRINGE | Freq: Once | INTRAMUSCULAR | Status: AC
  Administered 2024-02-18: 40 mg via SUBCUTANEOUS
  Filled 2024-02-18: qty 0.4

## 2024-02-18 MED ORDER — ONDANSETRON HCL 4 MG/2ML IJ SOLN: 4.0000 mg | Freq: Four times a day (QID) | INTRAMUSCULAR | Status: DC | PRN

## 2024-02-18 MED ORDER — ATORVASTATIN CALCIUM 80 MG PO TABS
80.0000 mg | ORAL_TABLET | Freq: Every evening | ORAL | Status: DC
  Administered 2024-02-18 – 2024-02-22 (×5): 80 mg via ORAL
  Filled 2024-02-18 (×5): qty 1

## 2024-02-18 MED ORDER — MORPHINE SULFATE (PF) 2 MG/ML IV SOLN: 0.5000 mg | INTRAVENOUS | Status: DC | PRN

## 2024-02-18 MED ORDER — FUROSEMIDE 40 MG PO TABS
40.0000 mg | ORAL_TABLET | Freq: Every day | ORAL | Status: DC
  Administered 2024-02-18: 40 mg via ORAL
  Filled 2024-02-18: qty 2

## 2024-02-18 MED ORDER — CARVEDILOL 3.125 MG PO TABS
3.1250 mg | ORAL_TABLET | Freq: Once | ORAL | Status: AC
  Administered 2024-02-18: 3.125 mg via ORAL
  Filled 2024-02-18: qty 1

## 2024-02-18 MED ORDER — MORPHINE SULFATE (PF) 4 MG/ML IV SOLN
4.0000 mg | Freq: Once | INTRAVENOUS | Status: AC
  Administered 2024-02-18: 4 mg via INTRAVENOUS
  Filled 2024-02-18: qty 1

## 2024-02-18 MED ORDER — PANTOPRAZOLE SODIUM 40 MG PO TBEC
40.0000 mg | DELAYED_RELEASE_TABLET | Freq: Every day | ORAL | Status: DC
  Administered 2024-02-18 – 2024-02-23 (×5): 40 mg via ORAL
  Filled 2024-02-18 (×5): qty 1

## 2024-02-18 MED ORDER — ALPRAZOLAM 0.25 MG PO TABS: 0.2500 mg | ORAL_TABLET | Freq: Two times a day (BID) | ORAL | Status: DC | PRN

## 2024-02-18 MED ORDER — OXYCODONE HCL 5 MG PO TABS: 5.0000 mg | ORAL_TABLET | ORAL | Status: DC | PRN

## 2024-02-18 MED ORDER — PIRFENIDONE 267 MG PO TABS: 801.0000 mg | ORAL_TABLET | Freq: Three times a day (TID) | ORAL | Status: DC

## 2024-02-18 MED ORDER — OXYCODONE HCL 5 MG PO TABS
5.0000 mg | ORAL_TABLET | Freq: Four times a day (QID) | ORAL | Status: DC | PRN
  Administered 2024-02-18: 5 mg via ORAL
  Filled 2024-02-18: qty 1

## 2024-02-18 MED ORDER — MORPHINE SULFATE (PF) 2 MG/ML IV SOLN: 2.0000 mg | Freq: Once | INTRAVENOUS | Status: DC | PRN

## 2024-02-18 MED ORDER — MORPHINE SULFATE (PF) 2 MG/ML IV SOLN
2.0000 mg | Freq: Once | INTRAVENOUS | Status: AC
  Administered 2024-02-18: 2 mg via INTRAVENOUS
  Filled 2024-02-18: qty 1

## 2024-02-18 MED ORDER — HYDROCODONE-ACETAMINOPHEN 5-325 MG PO TABS: 1.0000 | ORAL_TABLET | Freq: Four times a day (QID) | ORAL | Status: DC | PRN

## 2024-02-18 MED ORDER — CARVEDILOL 3.125 MG PO TABS
3.1250 mg | ORAL_TABLET | Freq: Two times a day (BID) | ORAL | Status: DC
  Administered 2024-02-18 – 2024-02-23 (×9): 3.125 mg via ORAL
  Filled 2024-02-18 (×10): qty 1

## 2024-02-18 MED ORDER — ONDANSETRON HCL 4 MG/2ML IJ SOLN: 4.0000 mg | Freq: Once | INTRAMUSCULAR | Status: DC | PRN

## 2024-02-18 MED ORDER — ONDANSETRON HCL 4 MG/2ML IJ SOLN
4.0000 mg | Freq: Once | INTRAMUSCULAR | Status: AC
  Administered 2024-02-18: 4 mg via INTRAVENOUS
  Filled 2024-02-18: qty 2

## 2024-02-18 MED ORDER — GABAPENTIN 300 MG PO CAPS
300.0000 mg | ORAL_CAPSULE | Freq: Every day | ORAL | Status: DC
Start: 2024-02-18 — End: 2024-02-23
  Administered 2024-02-18 – 2024-02-23 (×5): 300 mg via ORAL
  Filled 2024-02-18 (×5): qty 1

## 2024-02-18 MED ORDER — FLUOXETINE HCL 20 MG PO CAPS
20.0000 mg | ORAL_CAPSULE | Freq: Every day | ORAL | Status: DC
  Administered 2024-02-18 – 2024-02-20 (×2): 20 mg via ORAL
  Filled 2024-02-18 (×2): qty 1

## 2024-02-18 MED ORDER — CARBIDOPA-LEVODOPA 25-100 MG PO TABS
1.0000 | ORAL_TABLET | Freq: Three times a day (TID) | ORAL | Status: DC
  Administered 2024-02-18 – 2024-02-23 (×15): 1 via ORAL
  Filled 2024-02-18 (×15): qty 1

## 2024-02-18 NOTE — H&P (Addendum)
 History and Physical    Patient: Matthew Liu EAV:[id number] DOB: 1941-09-14 DOA: 02/18/2024 DOS: the patient was seen and examined on 02/18/2024 PCP: Adrian Hopper, MD  Patient coming from: Home  Chief Complaint:  Chief Complaint  Patient presents with   Fall   Hip Pain   HPI: [name] is a 83 y.o. male with medical history significant of CAD s/p CABG x5, anemia of chronic disease, CKD 3A, dysplipidemia, HTN, hx of CVA, idiopathic pulmonary fibrosis, parkinson's disease, OSA, PAD, GERD presenting to the ED with left hip pain after sustaining a fall.   Patient reports that he was awakened by his disabled wife earlier this morning around 1AM as she was hungry. Patient brought wife to kitchen and wife subsequently ate. Once they were done, patient helped wife stand up off of the chair. However, at this time, wife suddenly lost her balance and fell backwards onto patient causing both of them to fall to the ground. Patient immediately began experiencing left hip pain after the fall.  He was able to call his daughter who subsequently called EMS.  Patient placed in leg brace and brought to the ED for further evaluation.  Patient denies any lightheadedness, dizziness, chest pain, palpitations, blurry vision prior to the fall.  ED course: Vital signs stable (variable HR due to 1st degree AV block with supraventricular tachycardia).  CBC with anemia around his baseline, otherwise unremarkable.  BMP with glucose 164, kidney function around his baseline, otherwise unremarkable.  PT/INR normal.  Chest x-ray unremarkable.  Right wrist x-ray unremarkable.  CT head without any acute intracranial findings.  CT C-spine without any acute C-spine injury noted.  Pelvic x-ray showing a displaced proximal left femur fracture, primarily subtrochanteric.  Left hip CT showing a comminuted and displaced intertrochanteric and subtrochanteric left femur fracture without any involvement of the proximal femoral neck  or femoral head.  There is presence of mild periarticular soft tissue swelling without significant focal hematoma.  Orthopedics consulted by ED provider.  [facility] hospitalist asked to evaluate patient for admission.  Review of Systems: As mentioned in the history of present illness. All other systems reviewed and are negative. Past Medical History:  Diagnosis Date   Acute ischemic stroke (HCC) 09/15/2015   Ankle fracture, left 2009   rod placed   Ankle fracture, left    rod placed   Anxiety    Aspirin  allergy    a. Anaphylactic rxn.   Barrett's esophagus    CAD (coronary artery disease)    a. Inf STEMI 07/2013: s/p DES x 2 from ostial to [facility] 07/13/13. Residual Cx disease for med rx (PCI if recurrent pain).   Controlled gout    Dyslipidemia    ED (erectile dysfunction)    Edema of both legs    Gastric polyp    GERD (gastroesophageal reflux disease)    Hiatal hernia    History of nuclear stress test    Nuclear stress test 5/19: EF 53, no ischemia, no infarction; Low Risk   Hydrocele of testis    Hyperglycemia    a. A1C 5.8 in 07/2013.   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   OSA (obstructive sleep apnea)    Severe obstructive sleep apnea with an AHI of 40.4/h on auto CPAP   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   LIMA to LAD, SVG to Diag, Sequential SVG to OM2-OM3, SVG to  RCA, EVH via right thigh and leg   Unstable angina [facility])    Past Surgical History:  Procedure Laterality Date   BACK SURGERY     L1 kyphoplasty by Dr Gwendlyn Lemmings 03/2018     thoractic 02-2018   cataract surgery      bilateral    COLONOSCOPY     CORONARY ANGIOPLASTY     stents    CORONARY ARTERY BYPASS GRAFT N/A 12/12/2018   Procedure: CORONARY ARTERY BYPASS GRAFTING (CABG);  Surgeon: Gardenia Jump, MD;  Location: [facility] OR;  Service: Open Heart Surgery;  Laterality: N/A;   ESOPHAGOGASTRODUODENOSCOPY  04/04/2016   Short-segment Barrett's  esophagus (biopsied) Gastric polyps (status post polypectomy x1) Hiatal hernia   HYDROCELE EXCISION Left 07/17/2014   Procedure: LEFT HYDROCELECTOMY;  Surgeon: Willye Harvey, MD;  Location: [facility] ORS;  Service: Urology;  Laterality: Left;   left ankle surgery  1999   LEFT HEART CATH AND CORONARY ANGIOGRAPHY N/A 12/11/2018   Procedure: LEFT HEART CATH AND CORONARY ANGIOGRAPHY;  Surgeon: Arty Binning, MD;  Location: [facility] INVASIVE CV LAB;  Service: Cardiovascular;  Laterality: N/A;   LEFT HEART CATHETERIZATION WITH CORONARY ANGIOGRAM N/A 07/13/2013   Procedure: LEFT HEART CATHETERIZATION WITH CORONARY ANGIOGRAM;  Surgeon: Mickiel Albany, MD;  Location: [facility] CATH LAB;  Service: Cardiovascular;  Laterality: N/A;   PERCUTANEOUS CORONARY STENT INTERVENTION (PCI-S)  07/13/2013   Procedure: PERCUTANEOUS CORONARY STENT INTERVENTION (PCI-S);  Surgeon: Mickiel Albany, MD;  Location: [facility] CATH LAB;  Service: Cardiovascular;;   TEE WITHOUT CARDIOVERSION N/A 12/12/2018   Procedure: TRANSESOPHAGEAL ECHOCARDIOGRAM (TEE);  Surgeon: Gardenia Jump, MD;  Location: [facility] OR;  Service: Open Heart Surgery;  Laterality: N/A;   TONSILLECTOMY     Social History:  reports that he has never smoked. He has never used smokeless tobacco. He reports current alcohol use. He reports that he does not use drugs.  Allergies  Allergen Reactions   Aspirin  Hives and Shortness Of Breath    "Shortness of Breath" can't breathe   Quinine Derivatives Hives and Other (See Comments)    "looks like he has the measles"    Family History  Problem Relation Age of Onset   Hypertension Mother    Ovarian cancer Mother    Heart failure Father    Stroke Neg Hx     Prior to Admission medications   Medication Sig Start Date End Date Taking? Authorizing Provider  acetaminophen  (TYLENOL ) 500 MG tablet Take 1 tablet (500 mg total) by mouth every 6 (six) hours as needed for mild pain or headache. 01/12/18   Annetta Killian, PA-C  ALPRAZolam  (XANAX ) 0.25  MG tablet Take 0.25 mg by mouth 2 (two) times daily as needed for anxiety. 06/29/13   [provider]  atorvastatin  (LIPITOR ) 80 MG tablet Take 1 tablet (80 mg total) by mouth every evening. 02/08/19   Loleta Ripa, NP  carbidopa -levodopa  (SINEMET  IR) 25-100 MG tablet Take 1 tablet by mouth 3 (three) times daily. 07/28/23   Wertman, Sara E, PA-C  carvedilol  (COREG ) 3.125 MG tablet Take 1 tablet (3.125 mg total) by mouth 2 (two) times daily with a meal. 01/29/24   Turner, Rufus Council, MD  Cholecalciferol  (VITAMIN D3) 50 MCG (2000 UT) capsule Take 2,000 Units by mouth daily.    [provider]  clopidogrel  (PLAVIX ) 75 MG tablet TAKE 1 TABLET BY MOUTH EVERY DAY Patient taking differently: Take 75 mg by mouth daily. 06/22/20   Arty Binning,  MD  FLUoxetine (PROZAC) 20 MG capsule Take 20 mg by mouth daily. 12/13/22   [provider]  furosemide  (LASIX ) 40 MG tablet Take 40 mg by mouth daily. 01/15/24   [provider]  gabapentin  (NEURONTIN ) 300 MG capsule Take 1 capsule (300 mg total) by mouth daily. 09/12/22   Margherita Shell, MD  losartan  (COZAAR ) 25 MG tablet Take 1 tablet (25 mg total) by mouth daily. 01/29/24 04/28/24  Jacqueline Matsu, MD  meloxicam (MOBIC) 15 MG tablet Take 15 mg by mouth daily. 01/26/24   [provider]  methocarbamol (ROBAXIN) 750 MG tablet Take 750 mg by mouth 2 (two) times daily as needed. 10/26/22   [provider]  mirtazapine (REMERON) 7.5 MG tablet Take 7.5 mg by mouth at bedtime.    [provider]  Multiple Vitamins-Minerals (ONE-A-DAY MENS 50+) TABS Take 1 tablet by mouth daily with breakfast.    [provider]  ondansetron  (ZOFRAN ) 4 MG tablet TAKE 1 TABLET (4 MG TOTAL) BY MOUTH EVERY 6 (SIX) HOURS AS NEEDED FOR NAUSEA OR VOMITING 01/01/24   Denson Flake, MD  ondansetron  (ZOFRAN -ODT) 4 MG disintegrating tablet Take 4 mg by mouth 4 (four) times daily as needed. 09/30/22   [provider]   pantoprazole  (PROTONIX ) 40 MG tablet Take 1 tablet (40 mg total) by mouth daily. 03/08/23   Lajuan Pila, MD  Pirfenidone  (ESBRIET ) 267 MG TABS Take 3 tablets (801 mg total) by mouth 3 (three) times daily with meals. 01/17/23   Mannam, Praveen, MD  tiZANidine (ZANAFLEX) 2 MG tablet Take 2 mg by mouth 3 (three) times daily. 01/25/24   [provider]    Physical Exam: Vitals:   02/18/24 0700 02/18/24 0715 02/18/24 0730 02/18/24 0822  BP:  125/61 121/72 112/66  Pulse: (!) 48 (!) 115 (!) 113 (!) 50  Resp: 14 20 (!) 24 (!) 21  Temp:      TempSrc:      SpO2: 100% 100% 100% 99%  Weight:      Height:       Physical Exam Constitutional:      General: He is not in acute distress.    Comments: Chronically ill appearing, pleasant elderly male.  HENT:     Head: Normocephalic and atraumatic.     Mouth/Throat:     Mouth: Mucous membranes are moist.     Pharynx: Oropharynx is clear. No oropharyngeal exudate.  Eyes:     General: No scleral icterus.    Extraocular Movements: Extraocular movements intact.     Conjunctiva/sclera: Conjunctivae normal.     Pupils: Pupils are equal, round, and reactive to light.  Cardiovascular:     Rate and Rhythm: Normal rate. Rhythm irregular.     Heart sounds: Normal heart sounds. No murmur heard.    No friction rub. No gallop.     Comments: LLE is neurovascularly intact distal to fracture. Pulmonary:     Effort: Pulmonary effort is normal. No respiratory distress.     Breath sounds: Normal breath sounds. No wheezing, rhonchi or rales.  Abdominal:     General: Bowel sounds are normal. There is no distension.     Palpations: Abdomen is soft.     Tenderness: There is no abdominal tenderness. There is no guarding or rebound.  Musculoskeletal:        General: No swelling.     Comments: LLE externally rotated at left hip. Very tender to palpation. Unable to fully assess ROM due  to exam limited by pain. LLE is in brace.  Skin:    General: Skin is warm  and dry.     Comments: Ecchymoses noted on bilateral wrists/forearms (chronic per patient)  Neurological:     General: No focal deficit present.     Mental Status: He is alert and oriented to person, place, and time.  Psychiatric:        Mood and Affect: Mood normal.        Behavior: Behavior normal.     Data Reviewed:  There are no new results to review at this time.    Latest Ref Rng & Units 02/18/2024    5:49 AM 09/03/2022   10:30 PM 03/26/2022    3:22 AM  CBC  WBC 4.0 - 10.5 K/uL 10.4  6.8  6.4   Hemoglobin 13.0 - 17.0 g/dL 11.9  14.7  82.9   Hematocrit 39.0 - 52.0 % 32.0  36.2  32.4   Platelets 150 - 400 K/uL 153  169  140     Assessment and Plan: No notes have been filed under this hospital service. Service: Hospitalist  Comminuted and displaced intertrochanteric and subtrochanteric left femur fracture -Secondary to mechanical fall as noted in HPI - Orthopedics consulted, appreciate management - Plan for medical optimization today per Ortho, will decide timing of repair - Heart healthy diet, n.p.o. 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 EKG showing sinus rhythm with 1st degree AV block with paroxysmal SVT. Discussed with cardiology, Dr. Paulita Boss, via phone call. Given patient is otherwise asymptomatic from this, can resume his home coreg . Patient remains okay to proceed with left femur fracture repair from a cardiac standpoint. -appreciate cardiology insight -continue home coreg  perioperatively -telemetry  HTN -BP ranging in the 120s-130s/60s -resume home coreg  3.125mg  BID and losartan  25mg   daily  Dilated CM Chronic systolic HF -follows Dr. Micael Adas (cardiology) for his care -most recent ECHO with LVEF 40-45%, severe LAE, mild MR and AR and possible PFO -per last cardiology note, plan for stress PET/CT to assess for ischemia in the outpatient setting -he is euvolemic on exam -resume home lasix  40mg  daily along with home coreg  and losartan  as noted above  CAD s/p CABG x5 Dyslipidemia PAD Hx of CVA -resume home lipitor  80mg  daily -holding home plavix  given patient likely going to OR for left hip repair tomorrow  Anemia of chronic disease -baseline hgb ~11.5 -on admission, hgb 11.2 -no reported bleeding events, though with mild chronic ecchymoses on bilateral wrists/forearms -f/u iron studies  -trend hgb curve, transfuse if hgb <7  Idiopathic pulmonary fibrosis -patient no longer taking pirfenidone  due to drug reaction -follows pulmonology, Dr. Waylan Haggard, in the outpatient setting -currently saturating well on RA  Parkinson's disease -resume home sinemet  IR 25-100mg  TID  Anxiety and Depression -resume home prozac 20mg  daily -PDMP reviewed, resume home xanax  0.25mg  BID prn  OSA -resume CPAP qhs  GERD -resume home PPI  CKD 3A -baseline Cr ~1.1-1.2 -on admission, Cr 1.22 -stable -monitor UOP -avoid nephrotoxic medications as able   Advance Care Planning:   Code Status: Full Code   Consults: orthopedics  Family Communication: updated family at bedside  Severity of Illness: The appropriate patient status for this patient is INPATIENT. Inpatient status is judged to be reasonable and necessary in order to provide the required intensity of service to ensure the patient's safety. The patient's presenting symptoms, physical exam findings, and initial radiographic and laboratory data in the context of their chronic comorbidities is felt to place them at high risk for further clinical deterioration. Furthermore, it is not anticipated that the patient will be  medically stable for discharge from the hospital within 2 midnights of admission.   * I certify that at the point of admission it is my clinical judgment that the patient will require inpatient hospital care spanning beyond 2 midnights from the point of admission due to high intensity of service, high risk for further deterioration and high frequency of surveillance required.*  Portions of this note were generated with Dragon dictation software. Dictation errors may occur despite best attempts at proofreading.  Author: Michall Noffke H Yoko Mcgahee, MD 02/18/2024 9:06 AM  For on call review [email or web address].

## 2024-02-18 NOTE — Progress Notes (Signed)
 Attempted to see patient for consult.  Patient in CT scan.  Will come back later this afternoon.  Plan for now will be for surgery potentially Monday for left hip fracture.  Okay for diet today.

## 2024-02-18 NOTE — ED Notes (Signed)
 Called [facility]CMD.

## 2024-02-18 NOTE — Consult Note (Signed)
 ORTHOPAEDIC CONSULTATION  REQUESTING PHYSICIAN: Mandy Second, MD  PCP:  Adrian Hopper, MD  Chief Complaint: Left hip pain  HPI: Matthew Liu is a 83 y.o. male who complains of left hip pain and inability to weight-bear after a fall early this a.m.  States he was helping his wife out of a chair and lost his balance and had a ground-level fall against the door frame and then onto the ground.  Denies loss of consciousness.  Had immediate pain.  EMS was called and transported to ER where x-rays demonstrated a reverse obliquity left hip fracture.  No history of previous hip surgery.  Well-controlled type 2 diabetes as well as history of cardiovascular disease as well as coronary artery disease.  Currently on Plavix .  He does live independently and is independent with ADLs.  However, his wife has dementia and he is her primary caregiver.  Their daughter does live across the street.   Past Medical History:  Diagnosis Date   Acute ischemic stroke (HCC) 09/15/2015   Ankle fracture, left 2009   rod placed   Ankle fracture, left    rod placed   Anxiety    Aspirin  allergy    a. Anaphylactic rxn.   Barrett's esophagus    CAD (coronary artery disease)    a. Inf STEMI 07/2013: s/p DES x 2 from ostial to [facility] 07/13/13. Residual Cx disease for med rx (PCI if recurrent pain).   Controlled gout    Dyslipidemia    ED (erectile dysfunction)    Edema of both legs    Gastric polyp    GERD (gastroesophageal reflux disease)    Hiatal hernia    History of nuclear stress test    Nuclear stress test 5/19: EF 53, no ischemia, no infarction; Low Risk   Hydrocele of testis    Hyperglycemia    a. A1C 5.8 in 07/2013.   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   LIMA to LAD, SVG to Diag,  Sequential SVG to OM2-OM3, SVG to RCA, EVH via right thigh and leg   Unstable angina [facility])    Past Surgical History:  Procedure Laterality Date   BACK SURGERY     L1 kyphoplasty by Dr Gwendlyn Lemmings 03/2018     thoractic 02-2018   cataract surgery      bilateral    COLONOSCOPY     CORONARY ANGIOPLASTY     stents    CORONARY ARTERY BYPASS GRAFT N/A 12/12/2018   Procedure: CORONARY ARTERY BYPASS GRAFTING (CABG);  Surgeon: Gardenia Jump, MD;  Location: [facility] OR;  Service: Open Heart Surgery;  Laterality: N/A;   ESOPHAGOGASTRODUODENOSCOPY  04/04/2016   Short-segment Barrett's esophagus (biopsied) Gastric polyps (status post polypectomy x1) Hiatal hernia   HYDROCELE EXCISION Left 07/17/2014   Procedure: LEFT HYDROCELECTOMY;  Surgeon: Willye Harvey, MD;  Location: [facility] ORS;  Service: Urology;  Laterality: Left;   left ankle surgery  1999   LEFT HEART CATH AND CORONARY ANGIOGRAPHY N/A 12/11/2018   Procedure: LEFT HEART CATH AND CORONARY ANGIOGRAPHY;  Surgeon: Arty Binning, MD;  [facility]: [facility] INVASIVE CV LAB;  Service: Cardiovascular;  Laterality: N/A;   LEFT HEART CATHETERIZATION WITH CORONARY ANGIOGRAM N/A 07/13/2013   Procedure: LEFT HEART  CATHETERIZATION WITH CORONARY ANGIOGRAM;  Surgeon: Mickiel Albany, MD;  Location: [facility] CATH LAB;  Service: Cardiovascular;  Laterality: N/A;   PERCUTANEOUS CORONARY STENT INTERVENTION (PCI-S)  07/13/2013   Procedure: PERCUTANEOUS CORONARY STENT INTERVENTION (PCI-S);  Surgeon: Mickiel Albany, MD;  Location: [facility] CATH LAB;  Service: Cardiovascular;;   TEE WITHOUT CARDIOVERSION N/A 12/12/2018   Procedure: TRANSESOPHAGEAL ECHOCARDIOGRAM (TEE);  Surgeon: Gardenia Jump, MD;  Location: [facility] OR;  Service: Open Heart Surgery;  Laterality: N/A;   TONSILLECTOMY     Social History   Socioeconomic History   Marital status: Married    Spouse name: Pat   Number of children: 2   Years of education: 12   Highest education level: Not on file  Occupational History   Occupation:  Chiropractor  Tobacco Use   Smoking status: Never   Smokeless tobacco: Never  Vaping Use   Vaping status: Never Used  Substance and Sexual Activity   Alcohol use: Yes    Comment: occasional beer    Drug use: No   Sexual activity: Not Currently  Other Topics Concern   Not on file  Social History Narrative   Lives with wife   Caffeine use: 1-2 cups coffee per day   Right handed   Social Drivers of Corporate investment banker Strain: Not on file  Food Insecurity: Not on file  Transportation Needs: Not on file  Physical Activity: Not on file  Stress: Not on file  Social Connections: Not on file   Family History  Problem Relation Age of Onset   Hypertension Mother    Ovarian cancer Mother    Heart failure Father    Stroke Neg Hx    Allergies  Allergen Reactions   Aspirin  Hives and Shortness Of Breath    "Shortness of Breath" can't breathe   Quinine Derivatives Hives and Other (See Comments)    "looks like he has the measles"   Prior to Admission medications   Medication Sig Start Date End Date Taking? Authorizing Provider  acetaminophen  (TYLENOL ) 500 MG tablet Take 1 tablet (500 mg total) by mouth every 6 (six) hours as needed for mild pain or headache. 01/12/18   Annetta Killian, PA-C  ALPRAZolam  (XANAX ) 0.25 MG tablet Take 0.25 mg by mouth 2 (two) times daily as needed for anxiety. 06/29/13   [provider]  atorvastatin  (LIPITOR ) 80 MG tablet Take 1 tablet (80 mg total) by mouth every evening. 02/08/19   Loleta Ripa, NP  carbidopa -levodopa  (SINEMET  IR) 25-100 MG tablet Take 1 tablet by mouth 3 (three) times daily. 07/28/23   Wertman, Sara E, PA-C  carvedilol  (COREG ) 3.125 MG tablet Take 1 tablet (3.125 mg total) by mouth 2 (two) times daily with a meal. 01/29/24   Turner, Rufus Council, MD  Cholecalciferol  (VITAMIN D3) 50 MCG (2000 UT) capsule Take 2,000 Units by mouth daily.    [provider]  clopidogrel  (PLAVIX ) 75 MG tablet  TAKE 1 TABLET BY MOUTH EVERY DAY Patient taking differently: Take 75 mg by mouth daily. 06/22/20   Arty Binning, MD  FLUoxetine (PROZAC) 20 MG capsule Take 20 mg by mouth daily. 12/13/22   [provider]  furosemide  (LASIX ) 40 MG tablet Take 40 mg by mouth daily. 01/15/24   [provider]  gabapentin  (NEURONTIN ) 300 MG capsule Take 1 capsule (300 mg total) by mouth daily. 09/12/22   Margherita Shell, MD  losartan  (COZAAR ) 25 MG tablet  Take 1 tablet (25 mg total) by mouth daily. 01/29/24 04/28/24  Jacqueline Matsu, MD  meloxicam (MOBIC) 15 MG tablet Take 15 mg by mouth daily. 01/26/24   [provider]  methocarbamol (ROBAXIN) 750 MG tablet Take 750 mg by mouth 2 (two) times daily as needed. 10/26/22   [provider]  mirtazapine (REMERON) 7.5 MG tablet Take 7.5 mg by mouth at bedtime.    [provider]  Multiple Vitamins-Minerals (ONE-A-DAY MENS 50+) TABS Take 1 tablet by mouth daily with breakfast.    [provider]  ondansetron  (ZOFRAN ) 4 MG tablet TAKE 1 TABLET (4 MG TOTAL) BY MOUTH EVERY 6 (SIX) HOURS AS NEEDED FOR NAUSEA OR VOMITING 01/01/24   Denson Flake, MD  ondansetron  (ZOFRAN -ODT) 4 MG disintegrating tablet Take 4 mg by mouth 4 (four) times daily as needed. 09/30/22   [provider]  pantoprazole  (PROTONIX ) 40 MG tablet Take 1 tablet (40 mg total) by mouth daily. 03/08/23   Lajuan Pila, MD  Pirfenidone  (ESBRIET ) 267 MG TABS Take 3 tablets (801 mg total) by mouth 3 (three) times daily with meals. 01/17/23   Mannam, Praveen, MD  tiZANidine (ZANAFLEX) 2 MG tablet Take 2 mg by mouth 3 (three) times daily. 01/25/24   [provider]   DG Chest Portable 1 View Result Date: 02/18/2024 CLINICAL DATA:  Femur fracture EXAM: PORTABLE CHEST 1 VIEW COMPARISON:  11/02/2022 FINDINGS: Chronic cardiomegaly. Prior CABG. There is no edema, consolidation, effusion, or pneumothorax. No acute osseous finding. IMPRESSION: No acute finding  Electronically Signed   By: Ronnette Coke M.D.   On: 02/18/2024 07:04   DG Wrist Complete Right Result Date: 02/18/2024 CLINICAL DATA:  Fall EXAM: RIGHT WRIST - COMPLETE 3 VIEW COMPARISON:  None Available. FINDINGS: There is no evidence of fracture or dislocation. Osteopenia and arterial calcification. IMPRESSION: No acute finding. Electronically Signed   By: Ronnette Coke M.D.   On: 02/18/2024 07:03   DG Pelvis Portable Result Date: 02/18/2024 CLINICAL DATA:  Fall.  Left hip pain EXAM: PORTABLE PELVIS 1 VIEWS COMPARISON:  None Available. FINDINGS: Acute left femur fracture with varus angulation and proximal retraction of the lesser trochanter. The main fracture plane is subtrochanteric with some posterior extension to the greater trochanter region. No evidence of pelvic ring fracture or diastasis. Generalized osteopenia. IMPRESSION: Displaced proximal femur fracture, primarily subtrochanteric. Electronically Signed   By: Ronnette Coke M.D.   On: 02/18/2024 07:03   CT Head Wo Contrast Result Date: 02/18/2024 CLINICAL DATA:  Level 2 trauma EXAM: CT HEAD WITHOUT CONTRAST CT CERVICAL SPINE WITHOUT CONTRAST TECHNIQUE: Multidetector CT imaging of the head and cervical spine was performed following the standard protocol without intravenous contrast. Multiplanar CT image reconstructions of the cervical spine were also generated. RADIATION DOSE REDUCTION: This exam was performed according to the departmental dose-optimization program which includes automated exposure control, adjustment of the mA and/or kV according to patient size and/or use of iterative reconstruction technique. COMPARISON:  09/14/2015 head CT FINDINGS: CT HEAD FINDINGS Brain: No evidence of acute infarction, hemorrhage, hydrocephalus, extra-axial collection or mass lesion/mass effect. Generalized brain atrophy. Chronic small vessel ischemia in the cerebral white matter. Vascular: No hyperdense vessel or unexpected calcification. Skull:  Normal. Negative for fracture or focal lesion. Sinuses/Orbits: No acute finding. CT CERVICAL SPINE FINDINGS Alignment: No traumatic malalignment Skull base and vertebrae: Generalized osteopenia. Remote superior endplate fracture of C7. Soft tissues and spinal canal: No prevertebral fluid or swelling. No visible canal hematoma. Disc levels:  Generalized degenerative changes specially affecting mid cervical disc spaces with C3-4 ankylosis. Upper chest: Negative IMPRESSION: No evidence of acute intracranial or cervical spine injury. Electronically Signed   By: Ronnette Coke M.D.   On: 02/18/2024 06:45   CT Cervical Spine Wo Contrast Result Date: 02/18/2024 CLINICAL DATA:  Level 2 trauma EXAM: CT HEAD WITHOUT CONTRAST CT CERVICAL SPINE WITHOUT CONTRAST TECHNIQUE: Multidetector CT imaging of the head and cervical spine was performed following the standard protocol without intravenous contrast. Multiplanar CT image reconstructions of the cervical spine were also generated. RADIATION DOSE REDUCTION: This exam was performed according to the departmental dose-optimization program which includes automated exposure control, adjustment of the mA and/or kV according to patient size and/or use of iterative reconstruction technique. COMPARISON:  09/14/2015 head CT FINDINGS: CT HEAD FINDINGS Brain: No evidence of acute infarction, hemorrhage, hydrocephalus, extra-axial collection or mass lesion/mass effect. Generalized brain atrophy. Chronic small vessel ischemia in the cerebral white matter. Vascular: No hyperdense vessel or unexpected calcification. Skull: Normal. Negative for fracture or focal lesion. Sinuses/Orbits: No acute finding. CT CERVICAL SPINE FINDINGS Alignment: No traumatic malalignment Skull base and vertebrae: Generalized osteopenia. Remote superior endplate fracture of C7. Soft tissues and spinal canal: No prevertebral fluid or swelling. No visible canal hematoma. Disc levels: Generalized degenerative changes  specially affecting mid cervical disc spaces with C3-4 ankylosis. Upper chest: Negative IMPRESSION: No evidence of acute intracranial or cervical spine injury. Electronically Signed   By: Ronnette Coke M.D.   On: 02/18/2024 06:45    Positive ROS: All other systems have been reviewed and were otherwise negative with the exception of those mentioned in the HPI and as above.  Physical Exam: General: Alert, no acute distress Cardiovascular: No pedal edema Respiratory: No cyanosis, no use of accessory musculature GI: No organomegaly, abdomen is soft and non-tender Skin: No lesions in the area of chief complaint Neurologic: Sensation intact distally Psychiatric: Patient is competent for consent with normal mood and affect Lymphatic: No axillary or cervical lymphadenopathy  MUSCULOSKELETAL: Left lower extremity demonstrates a externally rotated and shortened lower extremity on the left side.  Distally neurovascular intact.  Assessment: Left hip reverse obliquity intertrochanteric hip fracture  Plan: -Plan will be for medical optimization today.  He does have a cardiac history as well as diabetes and on Plavix .  Likely will be cleared for surgery unable to go on Monday.  Dr. Charol Copas will update the chart with dispo planning either today or first thing tomorrow.  Please keep n.p.o. tonight, 02/18/2024 at midnight.      Janeth Medicus, MD Cell [phone]    02/18/2024 9:37 AM

## 2024-02-18 NOTE — Plan of Care (Signed)
  Problem: Nutrition: Goal: Adequate nutrition will be maintained Outcome: Progressing   Problem: Pain Managment: Goal: General experience of comfort will improve and/or be controlled Outcome: Progressing

## 2024-02-18 NOTE — Progress Notes (Signed)
 Pt states he does not wear cpap at home and does not wish to wear one here. RT will continue to monitor.

## 2024-02-18 NOTE — ED Provider Notes (Signed)
  Provider Note MRN:  [id number]  Arrival date & time: 02/18/24    ED Course and Medical Decision Making  Assumed care from Dr Dolan Freiberg at shift change.  See note from prior team for complete details, in brief:  Clinical Course as of 02/18/24 0757  Sun Feb 18, 2024  0710 Handoff JL 83 yo male Matthew Liu while trying to help wife up out from chair Wife fell on top of him Right intertroch femur fx (swintek) > plan OR mon Labs stable [SG]  0756 Spoke with hospitalist [SG]    Clinical Course User Index [SG] Teddi Favors, DO     Procedures  Final Clinical Impressions(s) / ED Diagnoses     ICD-10-CM   1. Fall, initial encounter  W19.XXXA     2. Closed fracture of left hip, initial encounter (HCC)  S72.002A     3. Injury of head, initial encounter  S09.90XA       ED Discharge Orders     None       Discharge Instructions   None        Teddi Favors, DO 02/18/24 [phone]

## 2024-02-18 NOTE — Progress Notes (Signed)
 Patient discussed with Dr. Curtiss Dowdy, ortho trauma specialist, due to complexity of injury. Further plan to follow.

## 2024-02-18 NOTE — ED Triage Notes (Signed)
 PT arrived via EMS, PT had a fall and was on the floor for an hour per EMS. PT is having left hip pain and obvious shorting and rotation. PT is A&O x4 PT also states some neck pain after the fall. PT states he did hit his head on the hard wood floor. PT states he didn't get knocked out. VSS

## 2024-02-18 NOTE — ED Notes (Signed)
 ..Trauma Response Nurse Documentation   NATHANAL Liu is a 83 y.o. male arriving to [facility] ED via EMS  On clopidogrel  75 mg daily. Trauma was activated as a Level 2 by charge nurse based on the following trauma criteria Elderly patients > 65 with head trauma on anti-coagulation (excluding ASA).  Patient cleared for CT by Dr. Dolan Freiberg. Pt transported to CT with trauma response nurse present to monitor. RN remained with the patient throughout their absence from the department for clinical observation.   GCS 15.  Trauma MD Arrival Time: N/A.  History   Past Medical History:  Diagnosis Date   Acute ischemic stroke (HCC) 09/15/2015   Ankle fracture, left 2009   rod placed   Ankle fracture, left    rod placed   Anxiety    Aspirin  allergy    a. Anaphylactic rxn.   Barrett's esophagus    CAD (coronary artery disease)    a. Inf STEMI 07/2013: s/p DES x 2 from ostial to [facility] 07/13/13. Residual Cx disease for med rx (PCI if recurrent pain).   Controlled gout    Dyslipidemia    ED (erectile dysfunction)    Edema of both legs    Gastric polyp    GERD (gastroesophageal reflux disease)    Hiatal hernia    History of nuclear stress test    Nuclear stress test 5/19: EF 53, no ischemia, no infarction; Low Risk   Hydrocele of testis    Hyperglycemia    a. A1C 5.8 in 07/2013.   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   LIMA to LAD, SVG to Diag, Sequential SVG to OM2-OM3, SVG to RCA, EVH via right thigh and leg   Unstable angina [facility])      Past Surgical History:  Procedure Laterality Date   BACK SURGERY     L1 kyphoplasty by Dr Gwendlyn Lemmings 03/2018     thoractic 02-2018   cataract surgery      bilateral    COLONOSCOPY     CORONARY ANGIOPLASTY     stents    CORONARY ARTERY BYPASS GRAFT  N/A 12/12/2018   Procedure: CORONARY ARTERY BYPASS GRAFTING (CABG);  Surgeon: Gardenia Jump, MD;  Location: [facility] OR;  Service: Open Heart Surgery;  Laterality: N/A;   ESOPHAGOGASTRODUODENOSCOPY  04/04/2016   Short-segment Barrett's esophagus (biopsied) Gastric polyps (status post polypectomy x1) Hiatal hernia   HYDROCELE EXCISION Left 07/17/2014   Procedure: LEFT HYDROCELECTOMY;  Surgeon: Willye Harvey, MD;  Location: [facility] ORS;  Service: Urology;  Laterality: Left;   left ankle surgery  1999   LEFT HEART CATH AND CORONARY ANGIOGRAPHY N/A 12/11/2018   Procedure: LEFT HEART CATH AND CORONARY ANGIOGRAPHY;  Surgeon: Arty Binning, MD;  Location: [facility] INVASIVE CV LAB;  Service: Cardiovascular;  Laterality: N/A;   LEFT HEART CATHETERIZATION WITH CORONARY ANGIOGRAM N/A 07/13/2013   Procedure: LEFT HEART CATHETERIZATION WITH CORONARY ANGIOGRAM;  Surgeon: Mickiel Albany, MD;  Location: [facility] CATH LAB;  Service: Cardiovascular;  Laterality: N/A;   PERCUTANEOUS CORONARY STENT INTERVENTION (PCI-S)  07/13/2013   Procedure: PERCUTANEOUS CORONARY STENT INTERVENTION (PCI-S);  Surgeon: Mickiel Albany, MD;  Location: [facility] CATH LAB;  Service: Cardiovascular;;   TEE WITHOUT CARDIOVERSION N/A 12/12/2018  Procedure: TRANSESOPHAGEAL ECHOCARDIOGRAM (TEE);  Surgeon: Gardenia Jump, MD;  Location: [facility] OR;  Service: Open Heart Surgery;  Laterality: N/A;   TONSILLECTOMY         Initial Focused Assessment (If applicable, or please see trauma documentation):   CT's Completed:   CT Head, CT C-Spine, and CT abdomen/pelvis w/ contrast   Interventions:   Plan for disposition:  Admission to floor   Consults completed:  Orthopaedic Surgeon Swinteck  Event Summary:  Pt transported via EMS after fall at home, pt fell attempting to assist wife out of her WC then wife fell on top of him, pt was able to crawl to the door to set off alarm, daughter arrived at the same time emergency responders, found pt wedged against front door.  Pt initially denied hitting head, but does c/o neck and L hip pain. Shortening and rotation noted to IV, LLE, traction and ccollar in place by EMS. pt is on plavix , after arrival pt did confirm hitting back of head. L2 was activated by charge nurse, VSS, GCS 15.  Labs drawn, xrays done, L hip fx noted. Pt transported to CT, placed on inpatient stretcher after CT for comfort. Family at bedside upon return to room 29.  Pt to be admitted by hospitalist with ortho consult, surgery pending.  MTP Summary (If applicable): N/A  Bedside handoff with ED RN Dreac .    Matthew Liu  Trauma Response RN  Please call TRN at [phone] for further assistance.

## 2024-02-18 NOTE — Hospital Course (Signed)
 83 yo M  Fall while trying to help his disabled wife off the chair. Lost his balance, she fell on him, broke his R hip. -ortho consulted -Monday  On plavix   Negative head imaging  HPI: Matthew Liu is a 83 y.o. male with medical history significant of CAD s/p CABG x5, anemia of chronic disease, CKD 3A, dysplipidemia, HTN, hx of CVA, idiopathic pulmonary fibrosis, parkinson's disease, OSA, PAD, GERD presenting to the ED with left hip pain after sustaining a fall.    Patient reports that he was awakened by his disabled wife earlier this morning around 1AM as she was hungry. Patient brought wife to kitchen and wife subsequently ate. Once they were done, patient helped wife stand up off of the chair. However, at this time, wife suddenly lost her balance and fell backwards onto patient causing both of them to fall to the ground. Patient immediately began experiencing left hip pain after the fall.  He was able to call his daughter who subsequently called EMS.  Patient placed in leg brace and brought to the ED for further evaluation.  Patient denies any lightheadedness, dizziness, chest pain, palpitations, blurry vision prior to the fall.   ED course: Vital signs stable (variable HR due to 1st degree AV block with supraventricular tachycardia).  CBC with anemia around his baseline, otherwise unremarkable.  BMP with glucose 164, kidney function around his baseline, otherwise unremarkable.  PT/INR normal.  Chest x-ray unremarkable.  Right wrist x-ray unremarkable.  CT head without any acute intracranial findings.  CT C-spine without any acute C-spine injury noted.  Pelvic x-ray showing a displaced proximal left femur fracture, primarily subtrochanteric.  Left hip CT showing a comminuted and displaced intertrochanteric and subtrochanteric left femur fracture without any involvement of the proximal femoral neck or femoral head.  There is presence of mild periarticular soft tissue swelling without significant  focal hematoma.  Orthopedics consulted by ED provider.  [facility] hospitalist asked to evaluate patient for admission.  Significant Events: Admitted 02/18/2024 left intertrochanteric fracture   Significant Labs: WBC 10.4, HgB 11.2, plt 153 INR 1.1 Na 135, K 3.5, CO2 of 22, BUN 41, scr 1.22, glu 164 Ferritin 130 Iron 56, %sat 20, TIBC 274  Significant Imaging Studies: CXR No acute finding  Pelvic XR Displaced proximal femur fracture, primarily subtrochanteric.  Right wrist XR No acute finding  CT head/c-spine No evidence of acute intracranial or cervical spine injury.  Left hip CT Comminuted and displaced intertrochanteric and subtrochanteric left femur fracture as described. No involvement of the proximal femoral neck or femoral head. 2. No evidence of left pelvic fracture. 3. Mild periarticular soft tissue swelling without significant focal hematoma. 4.  Aortic Atherosclerosis Left knee XR Minimal joint effusion. No fracture or dislocation   Antibiotic Therapy: Anti-infectives (From admission, onward)    Start     Dose/Rate Route Frequency Ordered Stop   02/19/24 1138  ceFAZolin  (ANCEF ) 2-4 GM/100ML-% IVPB       Note to Pharmacy: [name]: cabinet override      02/19/24 1138 02/19/24 2344       Procedures:   Consultants: orthopedics

## 2024-02-18 NOTE — ED Provider Notes (Signed)
 Emergency Department Provider Note   I have reviewed the triage vital signs and the nursing notes.   HISTORY  Chief Complaint Fall and Hip Pain   HPI Matthew Liu is a 83 y.o. male with past history reviewed below, on Plavix , presents to the emergency department after mechanical fall.  Patient states he was helping his wife to get up, pulling back a chair when he lost his balance falling initially against the door frame and then to the ground.  His wife then apparently fell on top of him and he fell to the ground striking his head on the floor.  No loss of consciousness.  He is having severe pain in the left hip.  EMS arrived to find him on the ground with shortening and rotation of the left lower extremity and bruising to the arms. Patient denies any CP, abd pain, or SOB.    Past Medical History:  Diagnosis Date   Acute ischemic stroke (HCC) 09/15/2015   Ankle fracture, left 2009   rod placed   Ankle fracture, left    rod placed   Anxiety    Aspirin  allergy    a. Anaphylactic rxn.   Barrett's esophagus    CAD (coronary artery disease)    a. Inf STEMI 07/2013: s/p DES x 2 from ostial to [facility] 07/13/13. Residual Cx disease for med rx (PCI if recurrent pain).   Controlled gout    Dyslipidemia    ED (erectile dysfunction)    Edema of both legs    Gastric polyp    GERD (gastroesophageal reflux disease)    Hiatal hernia    History of nuclear stress test    Nuclear stress test 5/19: EF 53, no ischemia, no infarction; Low Risk   Hydrocele of testis    Hyperglycemia    a. A1C 5.8 in 07/2013.   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 Respiratory: Denies shortness of breath. Gastrointestinal: No abdominal pain.  No nausea, no vomiting.  No diarrhea.  No constipation. Genitourinary: Negative for dysuria. Musculoskeletal: Positive left hip pain.  Skin: Negative for rash. Neurological: Positive HA. No numbness.  ____________________________________________   PHYSICAL EXAM:  VITAL SIGNS: ED Triage Vitals  Encounter Vitals Group     BP 02/18/24 0548 (!) 140/57     Pulse Rate 02/18/24 0548 70     Resp 02/18/24 0548 18     Temp 02/18/24 0548 (!) 97.4 F (36.3 C)     Temp Source 02/18/24 0554 Oral     SpO2 02/18/24 0548 100 %     Weight 02/18/24 0548 135 lb (61.2 kg)     Height 02/18/24 0548 5\' 7"  (1.702 m)   Constitutional: Alert and oriented. Well appearing and in no acute distress. Eyes: Conjunctivae are normal. PERRL (2mm reactive).  Head: Atraumatic. Nose: No congestion/rhinnorhea. Mouth/Throat: Mucous membranes are moist.   Neck: No stridor. C collar in place.  Cardiovascular: Normal rate, regular rhythm. Good peripheral circulation. Grossly normal heart sounds. 2+ DP pulses  in the bilateral LEs. Respiratory: Normal respiratory effort.  No retractions. Lungs CTAB. Gastrointestinal: Soft and nontender. No distention.  Musculoskeletal: LLE is externally rotated and shortened.  Neurologic:  Normal speech and language. No gross focal neurologic deficits are appreciated.  Skin:  Skin is warm, dry. Bruising to the bilateral upper extremities with skin tear to the right wrist.    ____________________________________________   LABS (all labs ordered are listed, but only abnormal results are displayed)  Labs Reviewed  BASIC METABOLIC PANEL WITH GFR - Abnormal; Notable for the following components:      Result Value   Glucose, Bld 164 (*)    BUN 41 (*)    Calcium  8.8 (*)    GFR, Estimated 59 (*)    All other components within  normal limits  CBC WITH DIFFERENTIAL/PLATELET - Abnormal; Notable for the following components:   RBC 3.23 (*)    Hemoglobin 11.2 (*)    HCT 32.0 (*)    MCH 34.7 (*)    Neutro Abs 8.3 (*)    All other components within normal limits  PROTIME-INR  IRON AND TIBC  FERRITIN  CBC  BASIC METABOLIC PANEL WITH GFR  TYPE AND SCREEN   ____________________________________________  RADIOLOGY  DG Knee Left Port Result Date: 02/18/2024 CLINICAL DATA:  Left femur fracture. EXAM: PORTABLE LEFT KNEE - 1-2 VIEW COMPARISON:  None Available. FINDINGS: No fracture or dislocation. Joint spaces are preserved allowing for difficulties with positioning. No focal bone abnormality or significant degenerative change. Minimal joint effusion. IMPRESSION: Minimal joint effusion. No fracture or dislocation. Electronically Signed   By: Chadwick Colonel M.D.   On: 02/18/2024 20:17   CT Hip Left Wo Contrast Result Date: 02/18/2024 CLINICAL DATA:  Status post fall. Closed left subtrochanteric hip fracture. EXAM: CT OF THE LEFT HIP WITHOUT CONTRAST TECHNIQUE: Multidetector CT imaging of the left hip was performed according to the standard protocol. Multiplanar CT image reconstructions were also generated. RADIATION DOSE REDUCTION: This exam was performed according to the departmental dose-optimization program which includes automated exposure control, adjustment of the mA and/or kV according to patient size and/or use of iterative reconstruction technique. COMPARISON:  Pelvic radiographs same date.  Pelvic CTA 09/04/2022. FINDINGS: Bones/Joint/Cartilage The bones appear adequately mineralized. There is a comminuted and displaced intertrochanteric and subtrochanteric left femur fracture. A medial butterfly fragment involving the lesser trochanter measures up to 4.8 cm in length. The distal fragment demonstrates up to 5.2 cm of proximal displacement and apex lateral angulation. There is no involvement of the proximal femoral neck.  The femoral head is located and intact. No evidence of left pelvic fracture. There are degenerative changes within the lower lumbar spine and left sacroiliac joint. Ligaments Suboptimally assessed by CT. Muscles and Tendons Mild generalized muscular atrophy. No significant focal hematoma identified. Soft tissues Mild soft tissue swelling around the fracture, greatest anteriorly. No evidence of foreign body or soft tissue emphysema. Aortic, iliac and femoral atherosclerosis noted. There are diverticular changes within the sigmoid colon. No evidence of intrapelvic hematoma. IMPRESSION: 1. Comminuted and displaced intertrochanteric and subtrochanteric left femur fracture as described. No involvement of the proximal femoral neck or femoral head. 2. No evidence of left pelvic fracture. 3. Mild periarticular soft tissue swelling without significant focal hematoma. 4.  Aortic Atherosclerosis (ICD10-I70.0). Electronically Signed   By: Elmon Hagedorn M.D.   On: 02/18/2024 10:40   DG Chest Portable 1 View Result Date: 02/18/2024 CLINICAL DATA:  Femur fracture EXAM: PORTABLE CHEST 1 VIEW COMPARISON:  11/02/2022 FINDINGS: Chronic cardiomegaly. Prior CABG. There is no edema, consolidation, effusion, or pneumothorax. No acute osseous finding. IMPRESSION: No acute finding Electronically Signed   By: Ronnette Coke M.D.   On: 02/18/2024 07:04   DG Wrist Complete Right Result Date: 02/18/2024 CLINICAL DATA:  Fall EXAM: RIGHT WRIST - COMPLETE 3 VIEW COMPARISON:  None Available. FINDINGS: There is no evidence of fracture or dislocation. Osteopenia and arterial calcification. IMPRESSION: No acute finding. Electronically Signed   By: Ronnette Coke M.D.   On: 02/18/2024 07:03   DG Pelvis Portable Result Date: 02/18/2024 CLINICAL DATA:  Fall.  Left hip pain EXAM: PORTABLE PELVIS 1 VIEWS COMPARISON:  None Available. FINDINGS: Acute left femur fracture with varus angulation and proximal retraction of the lesser trochanter. The  main fracture plane is subtrochanteric with some posterior extension to the greater trochanter region. No evidence of pelvic ring fracture or diastasis. Generalized osteopenia. IMPRESSION: Displaced proximal femur fracture, primarily subtrochanteric. Electronically Signed   By: Ronnette Coke M.D.   On: 02/18/2024 07:03   CT Head Wo Contrast Result Date: 02/18/2024 CLINICAL DATA:  Level 2 trauma EXAM: CT HEAD WITHOUT CONTRAST CT CERVICAL SPINE WITHOUT CONTRAST TECHNIQUE: Multidetector CT imaging of the head and cervical spine was performed following the standard protocol without intravenous contrast. Multiplanar CT image reconstructions of the cervical spine were also generated. RADIATION DOSE REDUCTION: This exam was performed according to the departmental dose-optimization program which includes automated exposure control, adjustment of the mA and/or kV according to patient size and/or use of iterative reconstruction technique. COMPARISON:  09/14/2015 head CT FINDINGS: CT HEAD FINDINGS Brain: No evidence of acute infarction, hemorrhage, hydrocephalus, extra-axial collection or mass lesion/mass effect. Generalized brain atrophy. Chronic small vessel ischemia in the cerebral white matter. Vascular: No hyperdense vessel or unexpected calcification. Skull: Normal. Negative for fracture or focal lesion. Sinuses/Orbits: No acute finding. CT CERVICAL SPINE FINDINGS Alignment: No traumatic malalignment Skull base and vertebrae: Generalized osteopenia. Remote superior endplate fracture of C7. Soft tissues and spinal canal: No prevertebral fluid or swelling. No visible canal hematoma. Disc levels: Generalized degenerative changes specially affecting mid cervical disc spaces with C3-4 ankylosis. Upper chest: Negative IMPRESSION: No evidence of acute intracranial or cervical spine injury. Electronically Signed   By: Ronnette Coke M.D.   On: 02/18/2024 06:45   CT Cervical Spine Wo Contrast Result Date:  02/18/2024 CLINICAL DATA:  Level 2 trauma EXAM: CT HEAD WITHOUT CONTRAST CT CERVICAL SPINE WITHOUT CONTRAST TECHNIQUE: Multidetector CT imaging of the head and cervical spine was performed following the standard protocol without intravenous contrast. Multiplanar CT image reconstructions of the cervical spine were also generated. RADIATION DOSE REDUCTION: This exam was performed according to the departmental dose-optimization program which includes automated exposure control, adjustment of the mA and/or kV according to patient size and/or use of iterative reconstruction technique. COMPARISON:  09/14/2015 head CT FINDINGS: CT HEAD FINDINGS Brain: No evidence of acute infarction, hemorrhage, hydrocephalus, extra-axial collection or mass lesion/mass effect. Generalized brain atrophy. Chronic small vessel ischemia in the cerebral white matter. Vascular: No hyperdense vessel or unexpected calcification. Skull: Normal. Negative for fracture or focal lesion. Sinuses/Orbits: No acute finding. CT CERVICAL SPINE FINDINGS Alignment: No traumatic malalignment Skull base and vertebrae: Generalized osteopenia. Remote superior endplate fracture of C7. Soft tissues and spinal canal: No prevertebral fluid or swelling. No visible canal hematoma. Disc levels: Generalized degenerative changes specially affecting mid cervical disc spaces with C3-4 ankylosis. Upper chest: Negative IMPRESSION: No evidence of acute intracranial or cervical  spine injury. Electronically Signed   By: Ronnette Coke M.D.   On: 02/18/2024 06:45    ____________________________________________   PROCEDURES  Procedure(s) performed:   Procedures  None  ____________________________________________   INITIAL IMPRESSION / ASSESSMENT AND PLAN / ED COURSE  Pertinent labs & imaging results that were available during my care of the patient were reviewed by me and considered in my medical decision making (see chart for details).   This patient is  Presenting for Evaluation of fall, which does require a range of treatment options, and is a complaint that involves a high risk of morbidity and mortality.  The Differential Diagnoses includes subdural hematoma, epidural hematoma, acute concussion, traumatic subarachnoid hemorrhage, cerebral contusions, etc.   Critical Interventions-    Medications  senna-docusate (Senokot-S) tablet 1 tablet (has no administration in time range)  ALPRAZolam  (XANAX ) tablet 0.25 mg (has no administration in time range)  atorvastatin  (LIPITOR ) tablet 80 mg (80 mg Oral Given 02/18/24 1732)  carbidopa -levodopa  (SINEMET  IR) 25-100 MG per tablet immediate release 1 tablet (1 tablet Oral Given 02/18/24 2111)  furosemide  (LASIX ) tablet 40 mg (40 mg Oral Given 02/18/24 1000)  FLUoxetine (PROZAC) capsule 20 mg (20 mg Oral Given 02/18/24 1001)  gabapentin  (NEURONTIN ) capsule 300 mg (300 mg Oral Given 02/18/24 1000)  pantoprazole  (PROTONIX ) EC tablet 40 mg (40 mg Oral Given 02/18/24 1001)  acetaminophen  (TYLENOL ) tablet 1,000 mg (1,000 mg Oral Given 02/18/24 2111)  HYDROmorphone  (DILAUDID ) injection 1 mg (1 mg Intravenous Given 02/18/24 1503)  oxyCODONE  (Oxy IR/ROXICODONE ) immediate release tablet 5 mg (5 mg Oral Given 02/18/24 1732)  carvedilol  (COREG ) tablet 3.125 mg (3.125 mg Oral Given 02/18/24 1733)  losartan  (COZAAR ) tablet 25 mg (has no administration in time range)  morphine  (PF) 4 MG/ML injection 4 mg (4 mg Intravenous Given 02/18/24 0607)  ondansetron  (ZOFRAN ) injection 4 mg (4 mg Intravenous Given 02/18/24 0607)  morphine  (PF) 2 MG/ML injection 2 mg (2 mg Intravenous Given 02/18/24 0728)  carvedilol  (COREG ) tablet 3.125 mg (3.125 mg Oral Given 02/18/24 1001)  losartan  (COZAAR ) tablet 25 mg (25 mg Oral Given 02/18/24 1001)  enoxaparin  (LOVENOX ) injection 40 mg (40 mg Subcutaneous Given 02/18/24 1000)    Reassessment after intervention: pain improved.    I did obtain Additional Historical Information from EMS.    Clinical Laboratory Tests Ordered, included BMP without AKI. CBC without leukocytosis. INR normal.   Radiologic Tests Ordered, included CT head, c spine, hip XR, CXR, and wrist XR. I independently interpreted the images and agree with radiology interpretation.   Cardiac Monitor Tracing which shows NSR.    Social Determinants of Health Risk patient is a non-smoker.   Consult complete with Ortho, Dr. Charol Copas. Plan for CT hip and TRH admit. Not likely to have surgery today. NPO after midnight.   Medical Decision Making: Summary:  The patient presents emergency department for evaluation after mechanical fall at home.  Has shortening and external rotation of the left hip.  Clinically suspect fracture.  The extremity is neurovascularly intact with soft compartments.  Had a fairly minor fall from seated position resulting in some head trauma.  Was activated as a level 2 trauma upon arrival per protocol.  No abdominal tenderness or mechanism to prompt CT imaging of the chest, abdomen, pelvis at this time.   Reevaluation with update and discussion with patient and family at bedside. C collar removed. Discussed hip findings. Plan for admit and ortho consultation.  Patient's presentation is most consistent with acute presentation with potential  threat to life or bodily function.   Disposition: admit  ____________________________________________  FINAL CLINICAL IMPRESSION(S) / ED DIAGNOSES  Final diagnoses:  Fall, initial encounter  Closed fracture of left hip, initial encounter [facility])  Injury of head, initial encounter    Note:  This document was prepared using Dragon voice recognition software and may include unintentional dictation errors.  Abby Hocking, MD, [facility]    Thiago Ragsdale, Shereen Dike, MD 02/19/24 [phone]

## 2024-02-19 ENCOUNTER — Encounter (HOSPITAL_COMMUNITY)
Admission: EM | Disposition: A | Discharge status: Skilled Nursing Facility | Payer: Self-pay | Source: Home / Self Care | Attending: Internal Medicine

## 2024-02-19 ENCOUNTER — Inpatient Hospital Stay (HOSPITAL_COMMUNITY)

## 2024-02-19 ENCOUNTER — Inpatient Hospital Stay (HOSPITAL_COMMUNITY): Admitting: Anesthesiology

## 2024-02-19 ENCOUNTER — Encounter (HOSPITAL_COMMUNITY): Payer: Self-pay | Admitting: Student

## 2024-02-19 ENCOUNTER — Other Ambulatory Visit: Payer: Self-pay

## 2024-02-19 DIAGNOSIS — I10 Essential (primary) hypertension: Secondary | ICD-10-CM

## 2024-02-19 DIAGNOSIS — S72142A Displaced intertrochanteric fracture of left femur, initial encounter for closed fracture: Secondary | ICD-10-CM

## 2024-02-19 DIAGNOSIS — S7225XA Nondisplaced subtrochanteric fracture of left femur, initial encounter for closed fracture: Secondary | ICD-10-CM

## 2024-02-19 DIAGNOSIS — I25110 Atherosclerotic heart disease of native coronary artery with unstable angina pectoris: Secondary | ICD-10-CM | POA: Diagnosis not present

## 2024-02-19 DIAGNOSIS — K219 Gastro-esophageal reflux disease without esophagitis: Secondary | ICD-10-CM

## 2024-02-19 DIAGNOSIS — E785 Hyperlipidemia, unspecified: Secondary | ICD-10-CM

## 2024-02-19 DIAGNOSIS — I2510 Atherosclerotic heart disease of native coronary artery without angina pectoris: Secondary | ICD-10-CM

## 2024-02-19 DIAGNOSIS — F0670 Mild neurocognitive disorder due to known physiological condition without behavioral disturbance: Secondary | ICD-10-CM

## 2024-02-19 HISTORY — PX: INTRAMEDULLARY (IM) NAIL INTERTROCHANTERIC: SHX5875

## 2024-02-19 LAB — CBC
HCT: 27.8 % — ABNORMAL LOW (ref 39.0–52.0)
Hemoglobin: 9.8 g/dL — ABNORMAL LOW (ref 13.0–17.0)
MCH: 34.8 pg — ABNORMAL HIGH (ref 26.0–34.0)
MCHC: 35.3 g/dL (ref 30.0–36.0)
MCV: 98.6 fL (ref 80.0–100.0)
Platelets: 124 10*3/uL — ABNORMAL LOW (ref 150–400)
RBC: 2.82 MIL/uL — ABNORMAL LOW (ref 4.22–5.81)
RDW: 12.9 % (ref 11.5–15.5)
WBC: 6.7 10*3/uL (ref 4.0–10.5)
nRBC: 0 % (ref 0.0–0.2)

## 2024-02-19 LAB — SYNOVIAL CELL COUNT + DIFF, W/ CRYSTALS
Crystals, Fluid: NONE SEEN
Eosinophils-Synovial: 0 % (ref 0–1)
Lymphocytes-Synovial Fld: 1 % (ref 0–20)
Monocyte-Macrophage-Synovial Fluid: 83 % (ref 50–90)
Neutrophil, Synovial: 16 % (ref 0–25)
WBC, Synovial: 129 /mm3 (ref 0–200)

## 2024-02-19 LAB — BASIC METABOLIC PANEL WITH GFR
Anion gap: 9 (ref 5–15)
BUN: 42 mg/dL — ABNORMAL HIGH (ref 8–23)
CO2: 25 mmol/L (ref 22–32)
Calcium: 8.6 mg/dL — ABNORMAL LOW (ref 8.9–10.3)
Chloride: 100 mmol/L (ref 98–111)
Creatinine, Ser: 1.33 mg/dL — ABNORMAL HIGH (ref 0.61–1.24)
GFR, Estimated: 53 mL/min — ABNORMAL LOW (ref >60)
Glucose, Bld: 151 mg/dL — ABNORMAL HIGH (ref 70–99)
Potassium: 4 mmol/L (ref 3.5–5.1)
Sodium: 134 mmol/L — ABNORMAL LOW (ref 135–145)

## 2024-02-19 LAB — VITAMIN D 25 HYDROXY (VIT D DEFICIENCY, FRACTURES): Vit D, 25-Hydroxy: 42.5 ng/mL (ref 30–100)

## 2024-02-19 SURGERY — FIXATION, FRACTURE, INTERTROCHANTERIC, WITH INTRAMEDULLARY ROD
Anesthesia: General | Laterality: Left

## 2024-02-19 MED ORDER — ROCURONIUM BROMIDE 10 MG/ML (PF) SYRINGE
PREFILLED_SYRINGE | INTRAVENOUS | Status: DC | PRN
  Administered 2024-02-19: 40 mg via INTRAVENOUS
  Administered 2024-02-19: 10 mg via INTRAVENOUS

## 2024-02-19 MED ORDER — ONDANSETRON HCL 4 MG/2ML IJ SOLN
4.0000 mg | Freq: Four times a day (QID) | INTRAMUSCULAR | Status: DC | PRN
Start: 2024-02-19 — End: 2024-02-23
  Administered 2024-02-19: 4 mg via INTRAVENOUS
  Filled 2024-02-19: qty 2

## 2024-02-19 MED ORDER — TRANEXAMIC ACID-NACL 1000-0.7 MG/100ML-% IV SOLN
INTRAVENOUS | Status: AC
  Filled 2024-02-19: qty 100

## 2024-02-19 MED ORDER — ACETAMINOPHEN 10 MG/ML IV SOLN
INTRAVENOUS | Status: AC
  Filled 2024-02-19: qty 100

## 2024-02-19 MED ORDER — METHOCARBAMOL 500 MG PO TABS
500.0000 mg | ORAL_TABLET | Freq: Four times a day (QID) | ORAL | Status: DC | PRN
  Administered 2024-02-20 – 2024-02-23 (×7): 500 mg via ORAL
  Filled 2024-02-19 (×8): qty 1

## 2024-02-19 MED ORDER — TRANEXAMIC ACID-NACL 1000-0.7 MG/100ML-% IV SOLN
1000.0000 mg | Freq: Once | INTRAVENOUS | Status: AC
  Administered 2024-02-19: 1000 mg via INTRAVENOUS
  Filled 2024-02-19: qty 100

## 2024-02-19 MED ORDER — LACTATED RINGERS IV SOLN
INTRAVENOUS | Status: DC
Start: 2024-02-19 — End: 2024-02-19

## 2024-02-19 MED ORDER — METOCLOPRAMIDE HCL 5 MG/ML IJ SOLN: 5.0000 mg | Freq: Three times a day (TID) | INTRAMUSCULAR | Status: DC | PRN

## 2024-02-19 MED ORDER — SODIUM CHLORIDE 0.9 % IV SOLN: INTRAVENOUS | Status: DC

## 2024-02-19 MED ORDER — CHLORHEXIDINE GLUCONATE 0.12 % MT SOLN
OROMUCOSAL | Status: AC
  Administered 2024-02-19: 15 mL via OROMUCOSAL
  Filled 2024-02-19: qty 15

## 2024-02-19 MED ORDER — CHLORHEXIDINE GLUCONATE 0.12 % MT SOLN: 15.0000 mL | Freq: Once | OROMUCOSAL | Status: AC

## 2024-02-19 MED ORDER — ALBUMIN HUMAN 5 % IV SOLN: INTRAVENOUS | Status: DC | PRN

## 2024-02-19 MED ORDER — TRANEXAMIC ACID-NACL 1000-0.7 MG/100ML-% IV SOLN
INTRAVENOUS | Status: DC | PRN
  Administered 2024-02-19: 1000 mg via INTRAVENOUS

## 2024-02-19 MED ORDER — FENTANYL CITRATE (PF) 250 MCG/5ML IJ SOLN
INTRAMUSCULAR | Status: DC | PRN
  Administered 2024-02-19: 25 ug via INTRAVENOUS
  Administered 2024-02-19: 50 ug via INTRAVENOUS
  Administered 2024-02-19: 100 ug via INTRAVENOUS

## 2024-02-19 MED ORDER — PROPOFOL 10 MG/ML IV BOLUS
INTRAVENOUS | Status: DC | PRN
  Administered 2024-02-19: 120 ug/kg/min via INTRAVENOUS
  Administered 2024-02-19: 100 mg via INTRAVENOUS

## 2024-02-19 MED ORDER — ONDANSETRON HCL 4 MG PO TABS: 4.0000 mg | ORAL_TABLET | Freq: Four times a day (QID) | ORAL | Status: DC | PRN

## 2024-02-19 MED ORDER — 0.9 % SODIUM CHLORIDE (POUR BTL) OPTIME
TOPICAL | Status: DC | PRN
  Administered 2024-02-19: 1000 mL

## 2024-02-19 MED ORDER — METHOCARBAMOL 1000 MG/10ML IJ SOLN: 500.0000 mg | Freq: Four times a day (QID) | INTRAMUSCULAR | Status: DC | PRN

## 2024-02-19 MED ORDER — DOCUSATE SODIUM 100 MG PO CAPS
100.0000 mg | ORAL_CAPSULE | Freq: Two times a day (BID) | ORAL | Status: DC
  Administered 2024-02-19 – 2024-02-23 (×8): 100 mg via ORAL
  Filled 2024-02-19 (×8): qty 1

## 2024-02-19 MED ORDER — DEXMEDETOMIDINE HCL IN NACL 80 MCG/20ML IV SOLN
INTRAVENOUS | Status: DC | PRN
  Administered 2024-02-19: 8 ug via INTRAVENOUS

## 2024-02-19 MED ORDER — DIPHENHYDRAMINE HCL 12.5 MG/5ML PO ELIX: 12.5000 mg | ORAL_SOLUTION | ORAL | Status: DC | PRN

## 2024-02-19 MED ORDER — FENTANYL CITRATE (PF) 250 MCG/5ML IJ SOLN
INTRAMUSCULAR | Status: AC
  Filled 2024-02-19: qty 5

## 2024-02-19 MED ORDER — METOCLOPRAMIDE HCL 5 MG PO TABS: 5.0000 mg | ORAL_TABLET | Freq: Three times a day (TID) | ORAL | Status: DC | PRN

## 2024-02-19 MED ORDER — ZOLPIDEM TARTRATE 5 MG PO TABS: 5.0000 mg | ORAL_TABLET | Freq: Every evening | ORAL | Status: DC | PRN

## 2024-02-19 MED ORDER — ONDANSETRON HCL 4 MG/2ML IJ SOLN
INTRAMUSCULAR | Status: DC | PRN
  Administered 2024-02-19: 4 mg via INTRAVENOUS

## 2024-02-19 MED ORDER — PROPOFOL 10 MG/ML IV BOLUS
INTRAVENOUS | Status: AC
  Filled 2024-02-19: qty 20

## 2024-02-19 MED ORDER — CEFAZOLIN SODIUM-DEXTROSE 2-4 GM/100ML-% IV SOLN
INTRAVENOUS | Status: AC
  Filled 2024-02-19: qty 100

## 2024-02-19 MED ORDER — PHENYLEPHRINE 80 MCG/ML (10ML) SYRINGE FOR IV PUSH (FOR BLOOD PRESSURE SUPPORT)
PREFILLED_SYRINGE | INTRAVENOUS | Status: DC | PRN
  Administered 2024-02-19 (×3): 160 ug via INTRAVENOUS
  Administered 2024-02-19: 80 ug via INTRAVENOUS

## 2024-02-19 MED ORDER — LIDOCAINE 2% (20 MG/ML) 5 ML SYRINGE
INTRAMUSCULAR | Status: DC | PRN
  Administered 2024-02-19: 80 mg via INTRAVENOUS

## 2024-02-19 MED ORDER — CLOPIDOGREL BISULFATE 75 MG PO TABS
75.0000 mg | ORAL_TABLET | Freq: Every day | ORAL | Status: DC
  Administered 2024-02-20 – 2024-02-23 (×4): 75 mg via ORAL
  Filled 2024-02-19 (×4): qty 1

## 2024-02-19 MED ORDER — SUGAMMADEX SODIUM 200 MG/2ML IV SOLN
INTRAVENOUS | Status: DC | PRN
  Administered 2024-02-19: 244.8 mg via INTRAVENOUS

## 2024-02-19 MED ORDER — HYDRALAZINE HCL 10 MG PO TABS: 10.0000 mg | ORAL_TABLET | Freq: Four times a day (QID) | ORAL | Status: DC | PRN

## 2024-02-19 MED ORDER — ACETAMINOPHEN 10 MG/ML IV SOLN
INTRAVENOUS | Status: DC | PRN
  Administered 2024-02-19: 1000 mg via INTRAVENOUS

## 2024-02-19 MED ORDER — PHENYLEPHRINE HCL-NACL 20-0.9 MG/250ML-% IV SOLN
INTRAVENOUS | Status: DC | PRN
  Administered 2024-02-19: 50 ug/min via INTRAVENOUS

## 2024-02-19 MED ORDER — OXYCODONE HCL 5 MG PO TABS
2.5000 mg | ORAL_TABLET | Freq: Four times a day (QID) | ORAL | Status: DC | PRN
  Administered 2024-02-20 – 2024-02-23 (×7): 5 mg via ORAL
  Filled 2024-02-19 (×9): qty 1

## 2024-02-19 MED ORDER — CEFAZOLIN SODIUM-DEXTROSE 2-4 GM/100ML-% IV SOLN
2.0000 g | Freq: Three times a day (TID) | INTRAVENOUS | Status: AC
  Administered 2024-02-19 – 2024-02-20 (×3): 2 g via INTRAVENOUS
  Filled 2024-02-19 (×3): qty 100

## 2024-02-19 MED ORDER — CEFAZOLIN SODIUM-DEXTROSE 2-3 GM-%(50ML) IV SOLR
INTRAVENOUS | Status: DC | PRN
  Administered 2024-02-19: 2 g via INTRAVENOUS

## 2024-02-19 MED ORDER — ORAL CARE MOUTH RINSE: 15.0000 mL | Freq: Once | OROMUCOSAL | Status: AC

## 2024-02-19 SURGICAL SUPPLY — 39 items
BIT DRILL INTERTAN LAG SCREW (BIT) IMPLANT
BIT DRILL SHORT 4.0 (BIT) IMPLANT
BRUSH SCRUB EZ PLAIN DRY (MISCELLANEOUS) ×2 IMPLANT
CHLORAPREP W/TINT 26 (MISCELLANEOUS) ×1 IMPLANT
COVER PERINEAL POST (MISCELLANEOUS) ×1 IMPLANT
COVER SURGICAL LIGHT HANDLE (MISCELLANEOUS) ×1 IMPLANT
DERMABOND ADVANCED .7 DNX12 (GAUZE/BANDAGES/DRESSINGS) ×1 IMPLANT
DERMABOND ADVANCED .7 DNX6 (GAUZE/BANDAGES/DRESSINGS) IMPLANT
DRAPE C-ARM 35X43 STRL (DRAPES) ×1 IMPLANT
DRAPE IMP U-DRAPE 54X76 (DRAPES) ×2 IMPLANT
DRAPE INCISE IOBAN 66X45 STRL (DRAPES) ×1 IMPLANT
DRAPE STERI IOBAN 125X83 (DRAPES) ×1 IMPLANT
DRAPE SURG 17X23 STRL (DRAPES) ×2 IMPLANT
DRAPE U-SHAPE 47X51 STRL (DRAPES) ×1 IMPLANT
DRESSING MEPILEX FLEX 4X4 (GAUZE/BANDAGES/DRESSINGS) ×1 IMPLANT
DRSG MEPILEX POST OP 4X8 (GAUZE/BANDAGES/DRESSINGS) ×1 IMPLANT
ELECTRODE REM PT RTRN 9FT ADLT (ELECTROSURGICAL) ×1 IMPLANT
GLOVE BIO SURGEON STRL SZ 6.5 (GLOVE) ×3 IMPLANT
GLOVE BIO SURGEON STRL SZ7.5 (GLOVE) ×4 IMPLANT
GLOVE BIOGEL PI IND STRL 6.5 (GLOVE) ×1 IMPLANT
GLOVE BIOGEL PI IND STRL 7.5 (GLOVE) ×1 IMPLANT
GOWN STRL REUS W/ TWL LRG LVL3 (GOWN DISPOSABLE) ×1 IMPLANT
KIT BASIN OR (CUSTOM PROCEDURE TRAY) ×1 IMPLANT
KIT TURNOVER KIT B (KITS) ×1 IMPLANT
MANIFOLD NEPTUNE II (INSTRUMENTS) ×1 IMPLANT
NAIL LOCK CANN 10X380 130D LT (Nail) IMPLANT
NS IRRIG 1000ML POUR BTL (IV SOLUTION) ×1 IMPLANT
PACK GENERAL/GYN (CUSTOM PROCEDURE TRAY) ×1 IMPLANT
PAD ARMBOARD POSITIONER FOAM (MISCELLANEOUS) ×2 IMPLANT
PIN GUIDE 3.2X343MM (PIN) IMPLANT
ROD GUIDE 3.0 (MISCELLANEOUS) IMPLANT
SCREW LAG COMPR KIT 100/95 (Screw) IMPLANT
SCREW TRIGEN LOW PROF 5.0X42.5 (Screw) IMPLANT
SUT MNCRL AB 3-0 PS2 18 (SUTURE) ×1 IMPLANT
SUT MON AB 2-0 CT1 36 (SUTURE) IMPLANT
SUT VIC AB 0 CT1 27XBRD ANBCTR (SUTURE) IMPLANT
SUT VIC AB 2-0 CT1 TAPERPNT 27 (SUTURE) ×2 IMPLANT
TOWEL GREEN STERILE (TOWEL DISPOSABLE) ×2 IMPLANT
WATER STERILE IRR 1000ML POUR (IV SOLUTION) ×1 IMPLANT

## 2024-02-19 NOTE — Assessment & Plan Note (Signed)
 02-19-2024 continue protonix  qday.  02-20-2024 continue protonix .

## 2024-02-19 NOTE — Progress Notes (Signed)
 PROGRESS NOTE    Matthew Liu  XBM:[id number] DOB: Feb 04, 1941 DOA: 02/18/2024 PCP: Adrian Hopper, MD  Subjective: Pt seen and examined. Met with dtr and wife at bedside. Pt's wife with cognitive deficits. Pt lost his balance while trying to get his wife something to eat around 3 am. Dtr states pt crawled to front door and opened door so that house alarm would go off.  Pt is s/p ORIF with IM nail left intertroch fracture. Is he WBAT as tolerating. Dtr wants pt to go to [facility] at discharge if possible. Awaiting PT evaluation.   Hospital Course: 83 yo M  Fall while trying to help his disabled wife off the chair. Lost his balance, she fell on him, broke his R hip. -ortho consulted -Monday  On plavix   Negative head imaging  HPI: Matthew Liu is a 83 y.o. male with medical history significant of CAD s/p CABG x5, anemia of chronic disease, CKD 3A, dysplipidemia, HTN, hx of CVA, idiopathic pulmonary fibrosis, parkinson's disease, OSA, PAD, GERD presenting to the ED with left hip pain after sustaining a fall.    Patient reports that he was awakened by his disabled wife earlier this morning around 1AM as she was hungry. Patient brought wife to kitchen and wife subsequently ate. Once they were done, patient helped wife stand up off of the chair. However, at this time, wife suddenly lost her balance and fell backwards onto patient causing both of them to fall to the ground. Patient immediately began experiencing left hip pain after the fall.  He was able to call his daughter who subsequently called EMS.  Patient placed in leg brace and brought to the ED for further evaluation.  Patient denies any lightheadedness, dizziness, chest pain, palpitations, blurry vision prior to the fall.   ED course: Vital signs stable (variable HR due to 1st degree AV block with supraventricular tachycardia).  CBC with anemia around his baseline, otherwise unremarkable.  BMP with glucose 164, kidney function  around his baseline, otherwise unremarkable.  PT/INR normal.  Chest x-ray unremarkable.  Right wrist x-ray unremarkable.  CT head without any acute intracranial findings.  CT C-spine without any acute C-spine injury noted.  Pelvic x-ray showing a displaced proximal left femur fracture, primarily subtrochanteric.  Left hip CT showing a comminuted and displaced intertrochanteric and subtrochanteric left femur fracture without any involvement of the proximal femoral neck or femoral head.  There is presence of mild periarticular soft tissue swelling without significant focal hematoma.  Orthopedics consulted by ED provider.  [facility] hospitalist asked to evaluate patient for admission.  Significant Events: Admitted 02/18/2024 left intertrochanteric fracture   Significant Labs: WBC 10.4, HgB 11.2, plt 153 INR 1.1 Na 135, K 3.5, CO2 of 22, BUN 41, scr 1.22, glu 164 Ferritin 130 Iron 56, %sat 20, TIBC 274  Significant Imaging Studies: CXR No acute finding  Pelvic XR Displaced proximal femur fracture, primarily subtrochanteric.  Right wrist XR No acute finding  CT head/c-spine No evidence of acute intracranial or cervical spine injury.  Left hip CT Comminuted and displaced intertrochanteric and subtrochanteric left femur fracture as described. No involvement of the proximal femoral neck or femoral head. 2. No evidence of left pelvic fracture. 3. Mild periarticular soft tissue swelling without significant focal hematoma. 4.  Aortic Atherosclerosis Left knee XR Minimal joint effusion. No fracture or dislocation   Antibiotic Therapy: Anti-infectives (From admission, onward)    Start     Dose/Rate Route Frequency Ordered Stop  02/19/24 1138  ceFAZolin  (ANCEF ) 2-4 GM/100ML-% IVPB       Note to Pharmacy: [name]: cabinet override      02/19/24 1138 02/19/24 2344       Procedures:   Consultants: orthopedics    Assessment and Plan: * Closed left subtrochanteric femur fracture  (HCC) 02-19-2024 Pt is s/p ORIF with IM nail left intertroch fracture. Is he WBAT as tolerating. Dtr wants pt to go to [facility] at discharge if possible. Awaiting PT evaluation. Ortho wants plavix  for DVT prophylaxis  IPF (idiopathic pulmonary fibrosis) (HCC) 02-19-2024 stable.  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  Coronary artery disease involving native coronary artery of native heart with unstable angina pectoris (HCC) 02-19-2024 stable. On coreg , lipitor .  OSA (obstructive sleep apnea) 02-19-2024 CPAP prn.  Gastroesophageal reflux disease 02-19-2024 continue protonix  qday.  Essential hypertension 02-19-2024 continue coreg . Hold ARB and lasix  for now.  DVT prophylaxis: SCDs Start: 02/19/24 1426  Plavix  per ortho recommendations   Code Status: Full Code Family Communication: discussed with dtr at bedside Disposition Plan: return home vs SNF Reason for continuing need for hospitalization: just had ORIF today. Awaiting PT eval.  Objective: Vitals:   02/19/24 1330 02/19/24 1345 02/19/24 1400 02/19/24 1425  BP: (!) 117/56 (!) 113/50 (!) 115/56 (!) 109/46  Pulse: 60 (!) 59 66 65  Resp: 12 13 18 16   Temp:   (!) 96.7 F (35.9 C) 97.6 F (36.4 C)  TempSrc:    Oral  SpO2: 100% 96% 97% 97%  Weight:      Height:        Intake/Output Summary (Last 24 hours) at 02/19/2024 1521 Last data filed at 02/19/2024 1313 Gross per 24 hour  Intake 1250 ml  Output 400 ml  Net 850 ml   Filed Weights   02/18/24 0548 02/19/24 0956  Weight: 61.2 kg 61.2 kg    Examination:  Physical Exam Vitals and nursing note reviewed.  Constitutional:      General: He is not in acute distress.    Appearance: He is not toxic-appearing or diaphoretic.     Comments: Appears frail  HENT:     Head: Normocephalic and atraumatic.     Nose: Nose normal.  Cardiovascular:     Rate and Rhythm: Normal  rate and regular rhythm.  Pulmonary:     Effort: Pulmonary effort is normal.     Breath sounds: Normal breath sounds.  Abdominal:     General: Abdomen is flat. Bowel sounds are normal. There is no distension.     Palpations: Abdomen is soft.     Tenderness: There is no abdominal tenderness.  Skin:    General: Skin is warm and dry.     Capillary Refill: Capillary refill takes less than 2 seconds.  Neurological:     Mental Status: He is alert and oriented to person, place, and time.     Data Reviewed: I have personally reviewed following labs and imaging studies  CBC: Recent Labs  Lab 02/18/24 0549 02/19/24 0456  WBC 10.4 6.7  NEUTROABS 8.3*  --   HGB 11.2* 9.8*  HCT 32.0* 27.8*  MCV 99.1 98.6  PLT 153 124*   Basic Metabolic Panel: Recent Labs  Lab 02/18/24 0549 02/19/24 0456  NA 135 134*  K 3.5 4.0  CL 101 100  CO2 22 25  GLUCOSE 164* 151*  BUN 41* 42*  CREATININE 1.22 1.33*  CALCIUM   8.8* 8.6*   GFR: Estimated Creatinine Clearance: 36.4 mL/min (A) (by C-G formula based on SCr of 1.33 mg/dL (H)). Coagulation Profile: Recent Labs  Lab 02/18/24 0549  INR 1.1   Anemia Panel: Recent Labs    02/18/24 1705  FERRITIN 130  TIBC 274  IRON 56    Radiology Studies: DG C-Arm 1-60 Min-No Report Result Date: 02/19/2024 Fluoroscopy was utilized by the requesting physician.  No radiographic interpretation.   DG Knee Left Port Result Date: 02/18/2024 CLINICAL DATA:  Left femur fracture. EXAM: PORTABLE LEFT KNEE - 1-2 VIEW COMPARISON:  None Available. FINDINGS: No fracture or dislocation. Joint spaces are preserved allowing for difficulties with positioning. No focal bone abnormality or significant degenerative change. Minimal joint effusion. IMPRESSION: Minimal joint effusion. No fracture or dislocation. Electronically Signed   By: Chadwick Colonel M.D.   On: 02/18/2024 20:17   CT Hip Left Wo Contrast Result Date: 02/18/2024 CLINICAL DATA:  Status post fall. Closed  left subtrochanteric hip fracture. EXAM: CT OF THE LEFT HIP WITHOUT CONTRAST TECHNIQUE: Multidetector CT imaging of the left hip was performed according to the standard protocol. Multiplanar CT image reconstructions were also generated. RADIATION DOSE REDUCTION: This exam was performed according to the departmental dose-optimization program which includes automated exposure control, adjustment of the mA and/or kV according to patient size and/or use of iterative reconstruction technique. COMPARISON:  Pelvic radiographs same date.  Pelvic CTA 09/04/2022. FINDINGS: Bones/Joint/Cartilage The bones appear adequately mineralized. There is a comminuted and displaced intertrochanteric and subtrochanteric left femur fracture. A medial butterfly fragment involving the lesser trochanter measures up to 4.8 cm in length. The distal fragment demonstrates up to 5.2 cm of proximal displacement and apex lateral angulation. There is no involvement of the proximal femoral neck. The femoral head is located and intact. No evidence of left pelvic fracture. There are degenerative changes within the lower lumbar spine and left sacroiliac joint. Ligaments Suboptimally assessed by CT. Muscles and Tendons Mild generalized muscular atrophy. No significant focal hematoma identified. Soft tissues Mild soft tissue swelling around the fracture, greatest anteriorly. No evidence of foreign body or soft tissue emphysema. Aortic, iliac and femoral atherosclerosis noted. There are diverticular changes within the sigmoid colon. No evidence of intrapelvic hematoma. IMPRESSION: 1. Comminuted and displaced intertrochanteric and subtrochanteric left femur fracture as described. No involvement of the proximal femoral neck or femoral head. 2. No evidence of left pelvic fracture. 3. Mild periarticular soft tissue swelling without significant focal hematoma. 4.  Aortic Atherosclerosis (ICD10-I70.0). Electronically Signed   By: Elmon Hagedorn M.D.   On:  02/18/2024 10:40   DG Chest Portable 1 View Result Date: 02/18/2024 CLINICAL DATA:  Femur fracture EXAM: PORTABLE CHEST 1 VIEW COMPARISON:  11/02/2022 FINDINGS: Chronic cardiomegaly. Prior CABG. There is no edema, consolidation, effusion, or pneumothorax. No acute osseous finding. IMPRESSION: No acute finding Electronically Signed   By: Ronnette Coke M.D.   On: 02/18/2024 07:04   DG Wrist Complete Right Result Date: 02/18/2024 CLINICAL DATA:  Fall EXAM: RIGHT WRIST - COMPLETE 3 VIEW COMPARISON:  None Available. FINDINGS: There is no evidence of fracture or dislocation. Osteopenia and arterial calcification. IMPRESSION: No acute finding. Electronically Signed   By: Ronnette Coke M.D.   On: 02/18/2024 07:03   DG Pelvis Portable Result Date: 02/18/2024 CLINICAL DATA:  Fall.  Left hip pain EXAM: PORTABLE PELVIS 1 VIEWS COMPARISON:  None Available. FINDINGS: Acute left femur fracture with varus angulation and proximal retraction of the lesser trochanter. The main  fracture plane is subtrochanteric with some posterior extension to the greater trochanter region. No evidence of pelvic ring fracture or diastasis. Generalized osteopenia. IMPRESSION: Displaced proximal femur fracture, primarily subtrochanteric. Electronically Signed   By: Ronnette Coke M.D.   On: 02/18/2024 07:03   CT Head Wo Contrast Result Date: 02/18/2024 CLINICAL DATA:  Level 2 trauma EXAM: CT HEAD WITHOUT CONTRAST CT CERVICAL SPINE WITHOUT CONTRAST TECHNIQUE: Multidetector CT imaging of the head and cervical spine was performed following the standard protocol without intravenous contrast. Multiplanar CT image reconstructions of the cervical spine were also generated. RADIATION DOSE REDUCTION: This exam was performed according to the departmental dose-optimization program which includes automated exposure control, adjustment of the mA and/or kV according to patient size and/or use of iterative reconstruction technique. COMPARISON:   09/14/2015 head CT FINDINGS: CT HEAD FINDINGS Brain: No evidence of acute infarction, hemorrhage, hydrocephalus, extra-axial collection or mass lesion/mass effect. Generalized brain atrophy. Chronic small vessel ischemia in the cerebral white matter. Vascular: No hyperdense vessel or unexpected calcification. Skull: Normal. Negative for fracture or focal lesion. Sinuses/Orbits: No acute finding. CT CERVICAL SPINE FINDINGS Alignment: No traumatic malalignment Skull base and vertebrae: Generalized osteopenia. Remote superior endplate fracture of C7. Soft tissues and spinal canal: No prevertebral fluid or swelling. No visible canal hematoma. Disc levels: Generalized degenerative changes specially affecting mid cervical disc spaces with C3-4 ankylosis. Upper chest: Negative IMPRESSION: No evidence of acute intracranial or cervical spine injury. Electronically Signed   By: Ronnette Coke M.D.   On: 02/18/2024 06:45   CT Cervical Spine Wo Contrast Result Date: 02/18/2024 CLINICAL DATA:  Level 2 trauma EXAM: CT HEAD WITHOUT CONTRAST CT CERVICAL SPINE WITHOUT CONTRAST TECHNIQUE: Multidetector CT imaging of the head and cervical spine was performed following the standard protocol without intravenous contrast. Multiplanar CT image reconstructions of the cervical spine were also generated. RADIATION DOSE REDUCTION: This exam was performed according to the departmental dose-optimization program which includes automated exposure control, adjustment of the mA and/or kV according to patient size and/or use of iterative reconstruction technique. COMPARISON:  09/14/2015 head CT FINDINGS: CT HEAD FINDINGS Brain: No evidence of acute infarction, hemorrhage, hydrocephalus, extra-axial collection or mass lesion/mass effect. Generalized brain atrophy. Chronic small vessel ischemia in the cerebral white matter. Vascular: No hyperdense vessel or unexpected calcification. Skull: Normal. Negative for fracture or focal lesion.  Sinuses/Orbits: No acute finding. CT CERVICAL SPINE FINDINGS Alignment: No traumatic malalignment Skull base and vertebrae: Generalized osteopenia. Remote superior endplate fracture of C7. Soft tissues and spinal canal: No prevertebral fluid or swelling. No visible canal hematoma. Disc levels: Generalized degenerative changes specially affecting mid cervical disc spaces with C3-4 ankylosis. Upper chest: Negative IMPRESSION: No evidence of acute intracranial or cervical spine injury. Electronically Signed   By: Ronnette Coke M.D.   On: 02/18/2024 06:45    Scheduled Meds:  acetaminophen   1,000 mg Oral Q8H   atorvastatin   80 mg Oral QPM   carbidopa -levodopa   1 tablet Oral TID   carvedilol   3.125 mg Oral BID WC   [START ON 02/20/2024] clopidogrel   75 mg Oral Daily   docusate sodium   100 mg Oral BID   FLUoxetine  20 mg Oral Daily   gabapentin   300 mg Oral Daily   pantoprazole   40 mg Oral Daily   Continuous Infusions:  sodium chloride  50 mL/hr at 02/19/24 1455   ceFAZolin       ceFAZolin  (ANCEF ) IV     tranexamic acid        LOS:  1 day   Time spent: 45 minutes  Unk Garb, DO  [facility]  02/19/2024, 3:21 PM

## 2024-02-19 NOTE — Assessment & Plan Note (Signed)
 02-19-2024 continue coreg . Hold ARB and lasix  for now.  02-20-2024 on coreg  bid. Hold arb/lasix  for now.

## 2024-02-19 NOTE — H&P (View-Only) (Signed)
 Orthopaedic Trauma Service (OTS) Consult   Patient ID: Matthew Liu MRN: [id number] DOB/AGE: 02/22/1941 83 y.o.  Reason for Consult:Left intertrochanteric femur fracture Referring Physician: Dr. Adonica Hoose, MD [facility]  HPI: Matthew Liu is an 83 y.o. male who is being seen in consultation at the request of Dr. Charol Copas for evaluation of left intertrochanteric femur fracture.  Patient had a ground-level fall sustaining the above injury.  He was brought to the emergency room where x-rays showed a intertrochanteric left strep subtrochanteric femur fracture.  Due to complexity Dr. Charol Copas asked that an orthopedic traumatologist take over his care.  Patient was seen and evaluated in the preoperative holding area.  Currently comfortable.  He was set up to go to the bathroom earlier today which caused a lot of pain.  He states that he ambulates without assist device.  He is the primary caretaker for his wife who has dementia.  She is currently at home.  Denies any other injuries.  He states that his neck occasionally bothers him but no recent injuries with this fall.  The patient is on Plavix .  Denies any other anticoagulation.  States that he is anaphylactic to aspirin .  Past Medical History:  Diagnosis Date   Acute ischemic stroke (HCC) 09/15/2015   Ankle fracture, left 2009   rod placed   Ankle fracture, left    rod placed   Anxiety    Aspirin  allergy    a. Anaphylactic rxn.   Barrett's esophagus    CAD (coronary artery disease)    a. Inf STEMI 07/2013: s/p DES x 2 from ostial to [facility] 07/13/13. Residual Cx disease for med rx (PCI if recurrent pain).   Controlled gout    Dyslipidemia    ED (erectile dysfunction)    Edema of both legs    Gastric polyp    GERD (gastroesophageal reflux disease)    Hiatal hernia    History of nuclear stress test    Nuclear stress test 5/19: EF 53, no ischemia, no infarction; Low Risk   Hydrocele of testis    Hyperglycemia    a. A1C 5.8 in 07/2013.    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   LIMA to LAD, SVG to Diag, Sequential SVG to OM2-OM3, SVG to RCA, EVH via right thigh and leg   Unstable angina [facility])     Past Surgical History:  Procedure Laterality Date   BACK SURGERY     L1 kyphoplasty by Dr Gwendlyn Lemmings 03/2018     thoractic 02-2018   cataract surgery      bilateral    COLONOSCOPY     CORONARY ANGIOPLASTY     stents    CORONARY ARTERY BYPASS GRAFT N/A 12/12/2018   Procedure: CORONARY ARTERY BYPASS GRAFTING (CABG);  Surgeon: Gardenia Jump, MD;  Location: [facility] OR;  Service: Open Heart Surgery;  Laterality: N/A;   ESOPHAGOGASTRODUODENOSCOPY  04/04/2016   Short-segment Barrett's esophagus (biopsied) Gastric polyps (status post polypectomy x1) Hiatal hernia   HYDROCELE EXCISION Left 07/17/2014   Procedure: LEFT HYDROCELECTOMY;  Surgeon: Willye Harvey, MD;  Location: [facility] ORS;  Service: Urology;  Laterality: Left;   left ankle surgery  1999   LEFT HEART CATH AND CORONARY ANGIOGRAPHY N/A 12/11/2018  Procedure: LEFT HEART CATH AND CORONARY ANGIOGRAPHY;  Surgeon: Arty Binning, MD;  Location: [facility] INVASIVE CV LAB;  Service: Cardiovascular;  Laterality: N/A;   LEFT HEART CATHETERIZATION WITH CORONARY ANGIOGRAM N/A 07/13/2013   Procedure: LEFT HEART CATHETERIZATION WITH CORONARY ANGIOGRAM;  Surgeon: Mickiel Albany, MD;  Location: [facility] CATH LAB;  Service: Cardiovascular;  Laterality: N/A;   PERCUTANEOUS CORONARY STENT INTERVENTION (PCI-S)  07/13/2013   Procedure: PERCUTANEOUS CORONARY STENT INTERVENTION (PCI-S);  Surgeon: Mickiel Albany, MD;  Location: [facility] CATH LAB;  Service: Cardiovascular;;   TEE WITHOUT CARDIOVERSION N/A 12/12/2018   Procedure: TRANSESOPHAGEAL ECHOCARDIOGRAM (TEE);  Surgeon: Gardenia Jump, MD;  Location: [facility] OR;   Service: Open Heart Surgery;  Laterality: N/A;   TONSILLECTOMY      Family History  Problem Relation Age of Onset   Hypertension Mother    Ovarian cancer Mother    Heart failure Father    Stroke Neg Hx     Social History:  reports that he has never smoked. He has never used smokeless tobacco. He reports current alcohol use. He reports that he does not use drugs.  Allergies:  Allergies  Allergen Reactions   Aspirin  Anaphylaxis and Shortness Of Breath   Quinine Derivatives Hives and Other (See Comments)    "looks like he has the measles"    Medications:  No current facility-administered medications on file prior to encounter.   Current Outpatient Medications on File Prior to Encounter  Medication Sig Dispense Refill   acetaminophen  (TYLENOL ) 500 MG tablet Take 1 tablet (500 mg total) by mouth every 6 (six) hours as needed for mild pain or headache. 30 tablet 0   ALPRAZolam  (XANAX ) 0.25 MG tablet Take 0.25 mg by mouth 2 (two) times daily as needed for anxiety.     atorvastatin  (LIPITOR ) 80 MG tablet Take 1 tablet (80 mg total) by mouth every evening. 90 tablet 3   carbidopa -levodopa  (SINEMET  IR) 25-100 MG tablet Take 1 tablet by mouth 3 (three) times daily. 270 tablet 3   carvedilol  (COREG ) 3.125 MG tablet Take 1 tablet (3.125 mg total) by mouth 2 (two) times daily with a meal. 180 tablet 3   Cholecalciferol  (VITAMIN D3) 50 MCG (2000 UT) capsule Take 2,000 Units by mouth daily.     clopidogrel  (PLAVIX ) 75 MG tablet TAKE 1 TABLET BY MOUTH EVERY DAY (Patient taking differently: Take 75 mg by mouth daily.) 90 tablet 2   cyanocobalamin  (VITAMIN B12) 100 MCG tablet Take 100 mcg by mouth daily.     docusate sodium  (COLACE) 100 MG capsule Take 200 mg by mouth 2 (two) times daily.     furosemide  (LASIX ) 40 MG tablet Take 40 mg by mouth daily.     losartan  (COZAAR ) 25 MG tablet Take 1 tablet (25 mg total) by mouth daily. 90 tablet 3   meloxicam (MOBIC) 15 MG tablet Take 15 mg by mouth daily  as needed for pain.     Multiple Vitamins-Minerals (ONE-A-DAY MENS 50+) TABS Take 1 tablet by mouth daily with breakfast.     pantoprazole  (PROTONIX ) 40 MG tablet Take 1 tablet (40 mg total) by mouth daily. 90 tablet 4   tiZANidine (ZANAFLEX) 2 MG tablet Take 2 mg by mouth every 8 (eight) hours as needed for muscle spasms.     FLUoxetine (PROZAC) 20 MG capsule Take 20 mg by mouth daily. (Patient not taking: Reported on 02/18/2024)     ondansetron  (ZOFRAN ) 4 MG tablet TAKE 1 TABLET (4 MG  TOTAL) BY MOUTH EVERY 6 (SIX) HOURS AS NEEDED FOR NAUSEA OR VOMITING (Patient not taking: Reported on 02/18/2024) 30 tablet 5   Pirfenidone  (ESBRIET ) 267 MG TABS Take 3 tablets (801 mg total) by mouth 3 (three) times daily with meals. (Patient not taking: Reported on 02/18/2024) 270 tablet 4     ROS: Constitutional: No fever or chills Vision: No changes in vision ENT: No difficulty swallowing CV: No chest pain Pulm: No SOB or wheezing GI: No nausea or vomiting GU: No urgency or inability to hold urine Skin: No poor wound healing Neurologic: No numbness or tingling Psychiatric: No depression or anxiety Heme: No bruising Allergic: No reaction to medications or food   Exam: Blood pressure 138/65, pulse 64, temperature 98.1 F (36.7 C), temperature source Oral, resp. rate 19, height 5\' 7"  (1.702 m), weight 61.2 kg, SpO2 100%. General: No acute distress Orientation: Awake alert and oriented x 3 Mood and Affect: Cooperative and pleasant Gait: Unable to assess due to the fracture. Coordination and balance: Within normal limits  Left lower extremity: Leg is shortened and externally rotated.  Swelling in the thigh but compartments are soft and compressible.  He has 2+ DP and PT pulses.  He is warm well-perfused foot.  He has active dorsiflexion plantarflexion of his toes and ankle but is limited secondary to pain.  He has sensation intact to light touch.  Right lower extremity: Skin without lesions. No  tenderness to palpation. Full painless ROM, full strength in each muscle groups without evidence of instability.   Medical Decision Making: Data: Imaging: X-rays and CT scan have been reviewed which shows a intertrochanteric/subtrochanteric proximal femur fracture with significant displacement.  Labs:  Results for orders placed or performed during the hospital encounter of 02/18/24 (from the past 24 hours)  Iron and TIBC     Status: None   Collection Time: 02/18/24  5:05 PM  Result Value Ref Range   Iron 56 45 - 182 ug/dL   TIBC 409 811 - 914 ug/dL   Saturation Ratios 20 17.9 - 39.5 %   UIBC 218 ug/dL  Ferritin     Status: None   Collection Time: 02/18/24  5:05 PM  Result Value Ref Range   Ferritin 130 24 - 336 ng/mL  CBC     Status: Abnormal   Collection Time: 02/19/24  4:56 AM  Result Value Ref Range   WBC 6.7 4.0 - 10.5 K/uL   RBC 2.82 (L) 4.22 - 5.81 MIL/uL   Hemoglobin 9.8 (L) 13.0 - 17.0 g/dL   HCT 78.2 (L) 95.6 - 21.3 %   MCV 98.6 80.0 - 100.0 fL   MCH 34.8 (H) 26.0 - 34.0 pg   MCHC 35.3 30.0 - 36.0 g/dL   RDW 08.6 57.8 - 46.9 %   Platelets 124 (L) 150 - 400 K/uL   nRBC 0.0 0.0 - 0.2 %  Basic metabolic panel     Status: Abnormal   Collection Time: 02/19/24  4:56 AM  Result Value Ref Range   Sodium 134 (L) 135 - 145 mmol/L   Potassium 4.0 3.5 - 5.1 mmol/L   Chloride 100 98 - 111 mmol/L   CO2 25 22 - 32 mmol/L   Glucose, Bld 151 (H) 70 - 99 mg/dL   BUN 42 (H) 8 - 23 mg/dL   Creatinine, Ser 6.29 (H) 0.61 - 1.24 mg/dL   Calcium  8.6 (L) 8.9 - 10.3 mg/dL   GFR, Estimated 53 (L) >60 mL/min   Anion  gap 9 5 - 15     Imaging or Labs ordered: None  Medical history and chart was reviewed and case discussed with medical provider.  Assessment/Plan: 83 year old male with a left proximal femur fracture.  Due to the unstable nature of his injury I recommend proceeding with cephalomedullary nailing of his left hip.  Risks and benefits were discussed with the patient.  This  included but not limited to bleeding, infection, malunion, nonunion, hardware failure, hardware irritation, nerve and blood vessel injury, DVT, even the possibility anesthetic complications.  He agreed to proceed with surgery and consent was obtained.  I did discuss his injury and procedure over the phone with his daughter Edwina Gram.  She again agreed to proceed with surgery and all questions were answered to the patient's and family's satisfaction.  Laneta Pintos, MD [facility] [phone] (office) [email or web address]

## 2024-02-19 NOTE — Assessment & Plan Note (Signed)
 02-19-2024 CPAP prn.  02-20-2024 stable

## 2024-02-19 NOTE — Assessment & Plan Note (Signed)
 02-19-2024 stable.  02-20-2024 stable.

## 2024-02-19 NOTE — Assessment & Plan Note (Signed)
 02-19-2024 stable. On coreg , lipitor .  02-20-2024 stable.

## 2024-02-19 NOTE — Anesthesia Preprocedure Evaluation (Addendum)
 Anesthesia Evaluation  Patient identified by MRN, date of birth, ID band Patient awake and Patient confused    Reviewed: Allergy & Precautions, NPO status , Patient's Chart, lab work & pertinent test results  History of Anesthesia Complications Negative for: history of anesthetic complications  Airway Mallampati: II  TM Distance: >3 FB Neck ROM: Full    Dental no notable dental hx. (+) Teeth Intact, Dental Advisory Given   Pulmonary neg pulmonary ROS, sleep apnea  Pulm fibrosis   Pulmonary exam normal breath sounds clear to auscultation       Cardiovascular hypertension, (-) angina + CAD, + Past MI and + CABG (2020)  Normal cardiovascular exam Rhythm:Regular Rate:Normal     Neuro/Psych   Anxiety      Neuromuscular disease CVA, No Residual Symptoms    GI/Hepatic hiatal hernia,GERD  ,,  Endo/Other    Renal/GU Renal InsufficiencyRenal diseaseLab Results      Component                Value               Date                      NA                       134 (L)             02/19/2024                CL                       100                 02/19/2024                K                        4.0                 02/19/2024                CO2                      25                  02/19/2024                BUN                      42 (H)              02/19/2024                CREATININE               1.33 (H)            02/19/2024                GFRNONAA                 53 (L)              02/19/2024                CALCIUM                   8.6 (L)  02/19/2024                ALBUMIN                   3.8                 01/18/2023                GLUCOSE                  151 (H)             02/19/2024                Musculoskeletal   Abdominal   Peds  Hematology Lab Results      Component                Value               Date                      WBC                      6.7                 02/19/2024                 HGB                      9.8 (L)             02/19/2024                HCT                      27.8 (L)            02/19/2024                MCV                      98.6                02/19/2024                PLT                      124 (L)             02/19/2024              Anesthesia Other Findings All ASA Quinine   Reproductive/Obstetrics                             Anesthesia Physical Anesthesia Plan  ASA: 3  Anesthesia Plan: General   Post-op Pain Management:    Induction: Intravenous  PONV Risk Score and Plan: Treatment may vary due to age or medical condition and Ondansetron   Airway Management Planned: Oral ETT  Additional Equipment: None  Intra-op Plan:   Post-operative Plan: Extubation in OR  Informed Consent:      Dental advisory given  Plan Discussed with:   Anesthesia Plan Comments:         Anesthesia Quick Evaluation

## 2024-02-19 NOTE — Progress Notes (Signed)
 Transition of Care [facility] - Inpatient Brief Assessment   Patient Details  Name: Matthew Liu MRN: [id number] Date of Birth: 1941-01-18  [facility] CM/SW Contact:    Jannine Meo, RN Phone Number: 02/19/2024, 2:54 PM   Clinical Narrative:  Patient is from home, had surgery today. No HH history listed. TOC will follow for discharge needs.  Transition of Care Asessment: Insurance and Status: Insurance coverage has been reviewed Patient has primary care physician: (P) Yes Home environment has been reviewed: (P) Home Prior level of function:: (P) Independent Prior/Current Home Services: (P) No current home services (No HH history listed in [facility]) Social Drivers of Health Review: (P) SDOH reviewed no interventions necessary Readmission risk has been reviewed: (P) Yes Transition of care needs: (P) transition of care needs identified, TOC will continue to follow

## 2024-02-19 NOTE — Op Note (Signed)
 Orthopaedic Surgery Operative Note (CSN: [id number] ) Date of Surgery: 02/19/2024  Admit Date: 02/18/2024   Diagnoses: Pre-Op Diagnoses: Left peritrochanteric femur fracture  Post-Op Diagnosis: Left peritrochanteric femur fracture Left knee effusion  Procedures: CPT 27245-Cephalomedullary nailing of left proximal femur fracture  Surgeons : Primary: Laneta Pintos, MD  Assistant: Alona Jamaica, PA-C  Location: OR 3   Anesthesia: General   Antibiotics: Ancef  2g preop   Tourniquet time: None    Estimated Blood Loss: 150 mL  Complications:* No complications entered in OR log *   Specimens: ID Type Source Tests Collected by Time Destination  A : Left knee fluid Body Fluid PATH Cytology Misc. fluid BODY FLUID CELL COUNT WITH DIFFERENTIAL, AEROBIC/ANAEROBIC CULTURE W GRAM STAIN (SURGICAL/DEEP WOUND), BODY FLUID CULTURE W GRAM STAIN Stefana Lodico, Florentina Huntsman, MD 02/19/2024 1248      Implants: Implant Name Type Inv. Item Serial No. Manufacturer Lot No. LRB No. Used Action  NAIL LOCK CANN [id number] 130D LT - [id number] Nail NAIL LOCK CANN 10X380 130D LT  [employer] [id number] Left 1 Implanted  SCREW LAG COMPR KIT 100/95 - [id number] Screw SCREW LAG COMPR KIT 100/95  [employer] [id number] Left 1 Implanted  SCREW TRIGEN LOW PROF 5.0X42.5 - [id number] Screw SCREW TRIGEN LOW PROF 5.0X42.5  [employer] [id number] Left 1 Implanted     Indications for Surgery: 83 year old male who sustained a ground-level fall with a left peritrochanteric femur fracture.  Due to the unstable nature of his injury I recommend proceeding with cephalomedullary nailing of the left femur.  Risks and benefits were discussed with the patient.  Risks included but not limited to bleeding, infection, malunion, nonunion, hardware failure, hardware irritation, nerve and blood vessel injury, DVT, and the possible anesthetic complications.  He agreed to proceed with surgery and consent  was obtained.  Operative Findings: 1.  Cephalomedullary nailing of left peritrochanteric femur fracture using [employer] InterTAN 10 x 380 mm nail with a 100 mm lag screw and a 95 mm compression screw. 2.  Large left knee effusion treated with aspiration with fluid sent for culture, cell count and Gram stain.  No obvious purulence or bleeding noted in the knee joint.  Procedure: The patient was identified in the preoperative holding area. Consent was confirmed with the patient and their family and all questions were answered. The operative extremity was marked after confirmation with the patient. he was then brought back to the operating room by our anesthesia colleagues.  He was placed under general anesthetic and carefully transferred over to the [facility] table.  All bony prominences well-padded.  Traction was applied to the left lower extremity and fluoroscopic imaging showed adequate reduction.  The left lower extremity was then prepped and draped in usual sterile fashion.  A timeout was performed to verify the patient, the procedure, and the extremity.  Preoperative antibiotics were dosed.  Small incision was made proximal to the greater trochanter and carried down through skin subcutaneous tissue.  I directed threaded guidewire at the tip of the greater trochanter and advanced into the proximal metaphysis.  I then used a entry reamer to enter the medullary canal.  I then passed a finger reduction tool and then the ball-tipped guidewire down the central canal and seated it into the distal metaphysis I then used measuring guide to decide to use a 380 mm nail.  I then made a percutaneous incision laterally to use a Cobb elevator to reduce the proximal segment  to the femoral shaft.  I held this reduction while I reamed.  I reamed sequentially from 8 mm to 11.5 mm in size and use a 10 mm nail.  The 10 x 380 mm nail was attached to the targeting arm and placed down the center of the canal.  The fracture  aligned appropriately.  I used the targeting arm to place threaded guidewire up into the head/neck segment.  I confirmed adequate tip apex distance with fluoroscopy.  I then measured the length and decided to use a 100 mm lag screw.  I drilled the path for the compression screw and placed an antirotation bar.  I then drilled the path for the lag screw and placed 100 mm lag screw.  I then placed the compression screw and compressed approximately 4 mm.  I then statically locked the proximal portion of the nail.  I then removed the targeting arm and then proceeded to use perfect circle technique to place 1 lateral to medial distal interlocking screw.  Final fluoroscopic imaging was obtained.  I then proceeded to aspirate the knee joint which had a large knee effusion.  An 18-gauge needle was placed in the lateral joint and we aspirated clear appearing synovial fluid.  We sent this for culture as well as Gram stain and cell count.  The incisions were then copiously irrigated.  3 a layered closure of 0 Vicryl, 2-0 Monocryl and Dermabond was used to close the skin.  Sterile dressings were applied.  The patient was then awoke from anesthesia and taken to the PACU in stable condition.  Post Op Plan/Instructions: The patient will be weightbearing as tolerated to the left lower extremity.  He will receive postoperative Ancef .  He will be placed on his Plavix  for DVT prophylaxis starting postoperative day 1.  Will have him mobilize with physical and Occupational Therapy.  I was present and performed the entire surgery.  Alona Jamaica, PA-C did assist me throughout the case. An assistant was necessary given the difficulty in approach, maintenance of reduction and ability to instrument the fracture.   Katheryne Pane, MD [facility]

## 2024-02-19 NOTE — Assessment & Plan Note (Addendum)
 02-19-2024 Pt is s/p ORIF with IM nail left intertroch fracture. Is he WBAT as tolerating. Dtr wants pt to go to [facility] at discharge if possible. Awaiting PT evaluation. Ortho wants plavix  for DVT prophylaxis

## 2024-02-19 NOTE — Anesthesia Procedure Notes (Signed)
 Procedure Name: Intubation Date/Time: 02/19/2024 11:53 AM  Performed by: [name], CRNAPre-anesthesia Checklist: Patient identified, Patient being monitored, Timeout performed, Emergency Drugs available and Suction available Patient Re-evaluated:Patient Re-evaluated prior to induction Oxygen Delivery Method: Circle system utilized Preoxygenation: Pre-oxygenation with 100% oxygen Induction Type: IV induction Ventilation: Mask ventilation without difficulty Laryngoscope Size: Mac and 3 Grade View: Grade I Tube type: Oral Tube size: 7.0 mm Number of attempts: 1 Airway Equipment and Method: Stylet Placement Confirmation: ETT inserted through vocal cords under direct vision, positive ETCO2 and breath sounds checked- equal and bilateral Secured at: 21 cm Tube secured with: Tape Dental Injury: Teeth and Oropharynx as per pre-operative assessment

## 2024-02-19 NOTE — Plan of Care (Signed)

## 2024-02-19 NOTE — Subjective & Objective (Signed)
 Pt seen and examined. Met with dtr and wife at bedside. Pt's wife with cognitive deficits. Pt lost his balance while trying to get his wife something to eat around 3 am. Dtr states pt crawled to front door and opened door so that house alarm would go off.  Pt is s/p ORIF with IM nail left intertroch fracture. Is he WBAT as tolerating. Dtr wants pt to go to [facility] at discharge if possible. Awaiting PT evaluation.

## 2024-02-19 NOTE — Transfer of Care (Signed)
 Immediate Anesthesia Transfer of Care Note  Patient: Matthew Liu  Procedure(s) Performed: FIXATION, FRACTURE, INTERTROCHANTERIC, WITH INTRAMEDULLARY ROD (Left)  Patient Location: PACU  Anesthesia Type:General  Level of Consciousness: drowsy, patient cooperative, and responds to stimulation  Airway & Oxygen Therapy: Patient Spontanous Breathing and Patient connected to face mask oxygen  Post-op Assessment: Report given to RN and Post -op Vital signs reviewed and stable  Post vital signs: Reviewed and stable  Last Vitals:  Vitals Value Taken Time  BP 112/52 02/19/24 1335  Temp 35.8 C 02/19/24 1325  Pulse 58 02/19/24 1338  Resp 14 02/19/24 1338  SpO2 98 % 02/19/24 1338  Vitals shown include unfiled device data.  Last Pain:  Vitals:   02/19/24 1325  TempSrc:   PainSc: Asleep         Complications: No notable events documented.

## 2024-02-19 NOTE — Progress Notes (Signed)
 Spoke to the pt's daughter Edwina Gram. She was able to talk to the pt. She is trying to come before surgery.

## 2024-02-19 NOTE — Interval H&P Note (Signed)
 History and Physical Interval Note:  02/19/2024 10:59 AM  Matthew Liu  has presented today for surgery, with the diagnosis of Left intertrochanteric femur fracture.  The various methods of treatment have been discussed with the patient and family. After consideration of risks, benefits and other options for treatment, the patient has consented to  Procedure(s): FIXATION, FRACTURE, INTERTROCHANTERIC, WITH INTRAMEDULLARY ROD (Left) as a surgical intervention.  The patient's history has been reviewed, patient examined, no change in status, stable for surgery.  I have reviewed the patient's chart and labs.  Questions were answered to the patient's satisfaction.     Jamariah Tony P Reeshemah Nazaryan

## 2024-02-19 NOTE — Anesthesia Postprocedure Evaluation (Signed)
 Anesthesia Post Note  Patient: THEO FLEECE  Procedure(s) Performed: FIXATION, FRACTURE, INTERTROCHANTERIC, WITH INTRAMEDULLARY ROD (Left)     Patient location during evaluation: PACU Anesthesia Type: General Level of consciousness: awake and alert Pain management: pain level controlled Vital Signs Assessment: post-procedure vital signs reviewed and stable Respiratory status: spontaneous breathing, nonlabored ventilation, respiratory function stable and patient connected to nasal cannula oxygen Cardiovascular status: blood pressure returned to baseline and stable Postop Assessment: no apparent nausea or vomiting Anesthetic complications: no  No notable events documented.  Last Vitals:  Vitals:   02/19/24 1400 02/19/24 1425  BP: (!) 115/56 (!) 109/46  Pulse: 66 65  Resp: 18 16  Temp: (!) 35.9 C 36.4 C  SpO2: 97% 97%    Last Pain:  Vitals:   02/19/24 1425  TempSrc: Oral  PainSc:                  Rosalita Combe

## 2024-02-19 NOTE — Consult Note (Signed)
 Orthopaedic Trauma Service (OTS) Consult   Patient ID: Matthew Liu MRN: [id number] DOB/AGE: 02/22/1941 83 y.o.  Reason for Consult:Left intertrochanteric femur fracture Referring Physician: Dr. Adonica Hoose, MD [facility]  HPI: Matthew Liu is an 83 y.o. male who is being seen in consultation at the request of Dr. Charol Copas for evaluation of left intertrochanteric femur fracture.  Patient had a ground-level fall sustaining the above injury.  He was brought to the emergency room where x-rays showed a intertrochanteric left strep subtrochanteric femur fracture.  Due to complexity Dr. Charol Copas asked that an orthopedic traumatologist take over his care.  Patient was seen and evaluated in the preoperative holding area.  Currently comfortable.  He was set up to go to the bathroom earlier today which caused a lot of pain.  He states that he ambulates without assist device.  He is the primary caretaker for his wife who has dementia.  She is currently at home.  Denies any other injuries.  He states that his neck occasionally bothers him but no recent injuries with this fall.  The patient is on Plavix .  Denies any other anticoagulation.  States that he is anaphylactic to aspirin .  Past Medical History:  Diagnosis Date   Acute ischemic stroke (HCC) 09/15/2015   Ankle fracture, left 2009   rod placed   Ankle fracture, left    rod placed   Anxiety    Aspirin  allergy    a. Anaphylactic rxn.   Barrett's esophagus    CAD (coronary artery disease)    a. Inf STEMI 07/2013: s/p DES x 2 from ostial to [facility] 07/13/13. Residual Cx disease for med rx (PCI if recurrent pain).   Controlled gout    Dyslipidemia    ED (erectile dysfunction)    Edema of both legs    Gastric polyp    GERD (gastroesophageal reflux disease)    Hiatal hernia    History of nuclear stress test    Nuclear stress test 5/19: EF 53, no ischemia, no infarction; Low Risk   Hydrocele of testis    Hyperglycemia    a. A1C 5.8 in 07/2013.    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   LIMA to LAD, SVG to Diag, Sequential SVG to OM2-OM3, SVG to RCA, EVH via right thigh and leg   Unstable angina [facility])     Past Surgical History:  Procedure Laterality Date   BACK SURGERY     L1 kyphoplasty by Dr Gwendlyn Lemmings 03/2018     thoractic 02-2018   cataract surgery      bilateral    COLONOSCOPY     CORONARY ANGIOPLASTY     stents    CORONARY ARTERY BYPASS GRAFT N/A 12/12/2018   Procedure: CORONARY ARTERY BYPASS GRAFTING (CABG);  Surgeon: Gardenia Jump, MD;  Location: [facility] OR;  Service: Open Heart Surgery;  Laterality: N/A;   ESOPHAGOGASTRODUODENOSCOPY  04/04/2016   Short-segment Barrett's esophagus (biopsied) Gastric polyps (status post polypectomy x1) Hiatal hernia   HYDROCELE EXCISION Left 07/17/2014   Procedure: LEFT HYDROCELECTOMY;  Surgeon: Willye Harvey, MD;  Location: [facility] ORS;  Service: Urology;  Laterality: Left;   left ankle surgery  1999   LEFT HEART CATH AND CORONARY ANGIOGRAPHY N/A 12/11/2018  Procedure: LEFT HEART CATH AND CORONARY ANGIOGRAPHY;  Surgeon: Arty Binning, MD;  Location: [facility] INVASIVE CV LAB;  Service: Cardiovascular;  Laterality: N/A;   LEFT HEART CATHETERIZATION WITH CORONARY ANGIOGRAM N/A 07/13/2013   Procedure: LEFT HEART CATHETERIZATION WITH CORONARY ANGIOGRAM;  Surgeon: Mickiel Albany, MD;  Location: [facility] CATH LAB;  Service: Cardiovascular;  Laterality: N/A;   PERCUTANEOUS CORONARY STENT INTERVENTION (PCI-S)  07/13/2013   Procedure: PERCUTANEOUS CORONARY STENT INTERVENTION (PCI-S);  Surgeon: Mickiel Albany, MD;  Location: [facility] CATH LAB;  Service: Cardiovascular;;   TEE WITHOUT CARDIOVERSION N/A 12/12/2018   Procedure: TRANSESOPHAGEAL ECHOCARDIOGRAM (TEE);  Surgeon: Gardenia Jump, MD;  Location: [facility] OR;   Service: Open Heart Surgery;  Laterality: N/A;   TONSILLECTOMY      Family History  Problem Relation Age of Onset   Hypertension Mother    Ovarian cancer Mother    Heart failure Father    Stroke Neg Hx     Social History:  reports that he has never smoked. He has never used smokeless tobacco. He reports current alcohol use. He reports that he does not use drugs.  Allergies:  Allergies  Allergen Reactions   Aspirin  Anaphylaxis and Shortness Of Breath   Quinine Derivatives Hives and Other (See Comments)    "looks like he has the measles"    Medications:  No current facility-administered medications on file prior to encounter.   Current Outpatient Medications on File Prior to Encounter  Medication Sig Dispense Refill   acetaminophen  (TYLENOL ) 500 MG tablet Take 1 tablet (500 mg total) by mouth every 6 (six) hours as needed for mild pain or headache. 30 tablet 0   ALPRAZolam  (XANAX ) 0.25 MG tablet Take 0.25 mg by mouth 2 (two) times daily as needed for anxiety.     atorvastatin  (LIPITOR ) 80 MG tablet Take 1 tablet (80 mg total) by mouth every evening. 90 tablet 3   carbidopa -levodopa  (SINEMET  IR) 25-100 MG tablet Take 1 tablet by mouth 3 (three) times daily. 270 tablet 3   carvedilol  (COREG ) 3.125 MG tablet Take 1 tablet (3.125 mg total) by mouth 2 (two) times daily with a meal. 180 tablet 3   Cholecalciferol  (VITAMIN D3) 50 MCG (2000 UT) capsule Take 2,000 Units by mouth daily.     clopidogrel  (PLAVIX ) 75 MG tablet TAKE 1 TABLET BY MOUTH EVERY DAY (Patient taking differently: Take 75 mg by mouth daily.) 90 tablet 2   cyanocobalamin  (VITAMIN B12) 100 MCG tablet Take 100 mcg by mouth daily.     docusate sodium  (COLACE) 100 MG capsule Take 200 mg by mouth 2 (two) times daily.     furosemide  (LASIX ) 40 MG tablet Take 40 mg by mouth daily.     losartan  (COZAAR ) 25 MG tablet Take 1 tablet (25 mg total) by mouth daily. 90 tablet 3   meloxicam (MOBIC) 15 MG tablet Take 15 mg by mouth daily  as needed for pain.     Multiple Vitamins-Minerals (ONE-A-DAY MENS 50+) TABS Take 1 tablet by mouth daily with breakfast.     pantoprazole  (PROTONIX ) 40 MG tablet Take 1 tablet (40 mg total) by mouth daily. 90 tablet 4   tiZANidine (ZANAFLEX) 2 MG tablet Take 2 mg by mouth every 8 (eight) hours as needed for muscle spasms.     FLUoxetine (PROZAC) 20 MG capsule Take 20 mg by mouth daily. (Patient not taking: Reported on 02/18/2024)     ondansetron  (ZOFRAN ) 4 MG tablet TAKE 1 TABLET (4 MG  TOTAL) BY MOUTH EVERY 6 (SIX) HOURS AS NEEDED FOR NAUSEA OR VOMITING (Patient not taking: Reported on 02/18/2024) 30 tablet 5   Pirfenidone  (ESBRIET ) 267 MG TABS Take 3 tablets (801 mg total) by mouth 3 (three) times daily with meals. (Patient not taking: Reported on 02/18/2024) 270 tablet 4     ROS: Constitutional: No fever or chills Vision: No changes in vision ENT: No difficulty swallowing CV: No chest pain Pulm: No SOB or wheezing GI: No nausea or vomiting GU: No urgency or inability to hold urine Skin: No poor wound healing Neurologic: No numbness or tingling Psychiatric: No depression or anxiety Heme: No bruising Allergic: No reaction to medications or food   Exam: Blood pressure 138/65, pulse 64, temperature 98.1 F (36.7 C), temperature source Oral, resp. rate 19, height 5\' 7"  (1.702 m), weight 61.2 kg, SpO2 100%. General: No acute distress Orientation: Awake alert and oriented x 3 Mood and Affect: Cooperative and pleasant Gait: Unable to assess due to the fracture. Coordination and balance: Within normal limits  Left lower extremity: Leg is shortened and externally rotated.  Swelling in the thigh but compartments are soft and compressible.  He has 2+ DP and PT pulses.  He is warm well-perfused foot.  He has active dorsiflexion plantarflexion of his toes and ankle but is limited secondary to pain.  He has sensation intact to light touch.  Right lower extremity: Skin without lesions. No  tenderness to palpation. Full painless ROM, full strength in each muscle groups without evidence of instability.   Medical Decision Making: Data: Imaging: X-rays and CT scan have been reviewed which shows a intertrochanteric/subtrochanteric proximal femur fracture with significant displacement.  Labs:  Results for orders placed or performed during the hospital encounter of 02/18/24 (from the past 24 hours)  Iron and TIBC     Status: None   Collection Time: 02/18/24  5:05 PM  Result Value Ref Range   Iron 56 45 - 182 ug/dL   TIBC 409 811 - 914 ug/dL   Saturation Ratios 20 17.9 - 39.5 %   UIBC 218 ug/dL  Ferritin     Status: None   Collection Time: 02/18/24  5:05 PM  Result Value Ref Range   Ferritin 130 24 - 336 ng/mL  CBC     Status: Abnormal   Collection Time: 02/19/24  4:56 AM  Result Value Ref Range   WBC 6.7 4.0 - 10.5 K/uL   RBC 2.82 (L) 4.22 - 5.81 MIL/uL   Hemoglobin 9.8 (L) 13.0 - 17.0 g/dL   HCT 78.2 (L) 95.6 - 21.3 %   MCV 98.6 80.0 - 100.0 fL   MCH 34.8 (H) 26.0 - 34.0 pg   MCHC 35.3 30.0 - 36.0 g/dL   RDW 08.6 57.8 - 46.9 %   Platelets 124 (L) 150 - 400 K/uL   nRBC 0.0 0.0 - 0.2 %  Basic metabolic panel     Status: Abnormal   Collection Time: 02/19/24  4:56 AM  Result Value Ref Range   Sodium 134 (L) 135 - 145 mmol/L   Potassium 4.0 3.5 - 5.1 mmol/L   Chloride 100 98 - 111 mmol/L   CO2 25 22 - 32 mmol/L   Glucose, Bld 151 (H) 70 - 99 mg/dL   BUN 42 (H) 8 - 23 mg/dL   Creatinine, Ser 6.29 (H) 0.61 - 1.24 mg/dL   Calcium  8.6 (L) 8.9 - 10.3 mg/dL   GFR, Estimated 53 (L) >60 mL/min   Anion  gap 9 5 - 15     Imaging or Labs ordered: None  Medical history and chart was reviewed and case discussed with medical provider.  Assessment/Plan: 83 year old male with a left proximal femur fracture.  Due to the unstable nature of his injury I recommend proceeding with cephalomedullary nailing of his left hip.  Risks and benefits were discussed with the patient.  This  included but not limited to bleeding, infection, malunion, nonunion, hardware failure, hardware irritation, nerve and blood vessel injury, DVT, even the possibility anesthetic complications.  He agreed to proceed with surgery and consent was obtained.  I did discuss his injury and procedure over the phone with his daughter Edwina Gram.  She again agreed to proceed with surgery and all questions were answered to the patient's and family's satisfaction.  Laneta Pintos, MD [facility] [phone] (office) [email or web address]

## 2024-02-19 NOTE — Assessment & Plan Note (Signed)
 02-19-2024 continue sinemet .  02-20-2024 continue sinemet 

## 2024-02-20 ENCOUNTER — Encounter (HOSPITAL_COMMUNITY): Payer: Self-pay | Admitting: Student

## 2024-02-20 DIAGNOSIS — J84112 Idiopathic pulmonary fibrosis: Secondary | ICD-10-CM | POA: Diagnosis not present

## 2024-02-20 DIAGNOSIS — I25110 Atherosclerotic heart disease of native coronary artery with unstable angina pectoris: Secondary | ICD-10-CM | POA: Diagnosis not present

## 2024-02-20 DIAGNOSIS — I10 Essential (primary) hypertension: Secondary | ICD-10-CM | POA: Diagnosis not present

## 2024-02-20 DIAGNOSIS — S7225XA Nondisplaced subtrochanteric fracture of left femur, initial encounter for closed fracture: Secondary | ICD-10-CM | POA: Diagnosis not present

## 2024-02-20 LAB — CBC
HCT: 18.3 % — ABNORMAL LOW (ref 39.0–52.0)
Hemoglobin: 6.3 g/dL — CL (ref 13.0–17.0)
MCH: 33.9 pg (ref 26.0–34.0)
MCHC: 34.4 g/dL (ref 30.0–36.0)
MCV: 98.4 fL (ref 80.0–100.0)
Platelets: 93 10*3/uL — ABNORMAL LOW (ref 150–400)
RBC: 1.86 MIL/uL — ABNORMAL LOW (ref 4.22–5.81)
RDW: 13.2 % (ref 11.5–15.5)
WBC: 5.8 10*3/uL (ref 4.0–10.5)
nRBC: 0 % (ref 0.0–0.2)

## 2024-02-20 LAB — BASIC METABOLIC PANEL WITH GFR
Anion gap: 9 (ref 5–15)
BUN: 37 mg/dL — ABNORMAL HIGH (ref 8–23)
CO2: 24 mmol/L (ref 22–32)
Calcium: 7.9 mg/dL — ABNORMAL LOW (ref 8.9–10.3)
Chloride: 103 mmol/L (ref 98–111)
Creatinine, Ser: 1.2 mg/dL (ref 0.61–1.24)
GFR, Estimated: >60 mL/min (ref >60)
Glucose, Bld: 126 mg/dL — ABNORMAL HIGH (ref 70–99)
Potassium: 3.8 mmol/L (ref 3.5–5.1)
Sodium: 136 mmol/L (ref 135–145)

## 2024-02-20 LAB — HEMOGLOBIN AND HEMATOCRIT, BLOOD
HCT: 20.2 % — ABNORMAL LOW (ref 39.0–52.0)
Hemoglobin: 6.9 g/dL — CL (ref 13.0–17.0)

## 2024-02-20 LAB — PREPARE RBC (CROSSMATCH)

## 2024-02-20 MED ORDER — SODIUM CHLORIDE 0.9% IV SOLUTION: Freq: Once | INTRAVENOUS | Status: AC

## 2024-02-20 NOTE — Progress Notes (Signed)
 OT Cancellation Note  Patient Details Name: TIANT PONTES MRN: [id number] DOB: September 01, 1941   Cancelled Treatment:    Reason Eval/Treat Not Completed: Other (comment) (transfusion running IV site at risk with transfer so defer to 5/14)  Neomia Banner 02/20/2024, 3:38 PM

## 2024-02-20 NOTE — Evaluation (Signed)
 Physical Therapy Evaluation Patient Details Name: Matthew Liu MRN: [id number] DOB: [birth year] Today's Date: 02/20/2024  History of Present Illness  83 y.o. male presents to [facility] hospital on 02/18/2024 after falling when assisting his wife. Pt found to have L hip fx, underwent cephalomedullary nailing of L femur on 5/12. PMH includes CABG x 5, CKD III, dyslipidemia, HTN, CVA, pulmonary fibrosis, Parkinson's disease, OSA, GERD, PAD.  Clinical Impression  Pt presents to PT with deficits in functional mobility, gait, balance, strength, power, endurance. Pt currently requires physical assistance for bed mobility and short distances of ambulation. Pt's tolerance for ambulation is limited by pain in L leg during stance phase. Pt will benefit from frequent mobilization in an effort to improve activity tolerance and reduce falls risk. Pt is the primary caregiver for his spouse at baseline, currently his children are caring for her. If the pt is able to have consistent caregiver support from his family he may be able to progress quickly and discharge home, however until this caregiver support is confirmed short term inpatient PT services are recommended.      If plan is discharge home, recommend the following: A little help with walking and/or transfers;A little help with bathing/dressing/bathroom;Assistance with cooking/housework;Help with stairs or ramp for entrance;Assist for transportation   Can travel by private vehicle   Yes    Equipment Recommendations BSC/3in1;Rolling walker (2 wheels)  Recommendations for Other Services       Functional Status Assessment Patient has had a recent decline in their functional status and demonstrates the ability to make significant improvements in function in a reasonable and predictable amount of time.     Precautions / Restrictions Precautions Precautions: Fall Restrictions Weight Bearing Restrictions Per Provider Order: Yes LLE Weight Bearing Per Provider  Order: Weight bearing as tolerated      Mobility  Bed Mobility Overal bed mobility: Needs Assistance Bed Mobility: Rolling, Sidelying to Sit Rolling: Mod assist Sidelying to sit: Min assist            Transfers Overall transfer level: Needs assistance Equipment used: Rolling walker (2 wheels) Transfers: Sit to/from Stand Sit to Stand: Contact guard assist                Ambulation/Gait Ambulation/Gait assistance: Min assist Gait Distance (Feet): 5 Feet Assistive device: Rolling walker (2 wheels) Gait Pattern/deviations: Step-to pattern, Decreased stance time - left Gait velocity: reduced Gait velocity interpretation: <1.31 ft/sec, indicative of household ambulator   General Gait Details: slowed step-to gait, increased trunk flexion over RW, reduced stance time on LLE  Stairs            Wheelchair Mobility     Tilt Bed    Modified Rankin (Stroke Patients Only)       Balance Overall balance assessment: Needs assistance Sitting-balance support: No upper extremity supported, Feet supported Sitting balance-Leahy Scale: Fair     Standing balance support: Bilateral upper extremity supported, Reliant on assistive device for balance Standing balance-Leahy Scale: Poor                               Pertinent Vitals/Pain Pain Assessment Pain Assessment: Faces Faces Pain Scale: Hurts little more Pain Location: L ribs Pain Descriptors / Indicators: Sore Pain Intervention(s): Monitored during session    Home Living Family/patient expects to be discharged to:: Private residence Living Arrangements: Spouse/significant other Available Help at Discharge: Family;Available PRN/intermittently Type of Home: House Home Access:  Ramped entrance       Home Layout: One level Home Equipment: Agricultural consultant (2 wheels);Cane - single point;Wheelchair - manual      Prior Function Prior Level of Function : Independent/Modified Independent;Driving              Mobility Comments: ambulating independently, primary caregiver for his spouse who has dementia       Extremity/Trunk Assessment   Upper Extremity Assessment Upper Extremity Assessment: Generalized weakness    Lower Extremity Assessment Lower Extremity Assessment: Generalized weakness    Cervical / Trunk Assessment Cervical / Trunk Assessment: Kyphotic  Communication   Communication Communication: No apparent difficulties    Cognition Arousal: Alert Behavior During Therapy: WFL for tasks assessed/performed   PT - Cognitive impairments: Memory                       PT - Cognition Comments: pt does not recall having surgery to repair his L femur Following commands: Intact       Cueing Cueing Techniques: Verbal cues     General Comments General comments (skin integrity, edema, etc.): VSS on RA    Exercises     Assessment/Plan    PT Assessment Patient needs continued PT services  PT Problem List Decreased strength;Decreased activity tolerance;Decreased balance;Decreased mobility;Decreased knowledge of use of DME;Decreased safety awareness;Decreased knowledge of precautions;Pain       PT Treatment Interventions DME instruction;Gait training;Functional mobility training;Therapeutic activities;Balance training;Neuromuscular re-education;Therapeutic exercise;Patient/family education    PT Goals (Current goals can be found in the Care Plan section)  Acute Rehab PT Goals Patient Stated Goal: to return to independence PT Goal Formulation: With patient Time For Goal Achievement: 03/05/24 Potential to Achieve Goals: Good    Frequency Min 3X/week     Co-evaluation               AM-PAC PT "6 Clicks" Mobility  Outcome Measure Help needed turning from your back to your side while in a flat bed without using bedrails?: A Lot Help needed moving from lying on your back to sitting on the side of a flat bed without using bedrails?: A Little Help  needed moving to and from a bed to a chair (including a wheelchair)?: A Little Help needed standing up from a chair using your arms (e.g., wheelchair or bedside chair)?: A Little Help needed to walk in hospital room?: Total Help needed climbing 3-5 steps with a railing? : Total 6 Click Score: 13    End of Session Equipment Utilized During Treatment: Gait belt Activity Tolerance: Patient tolerated treatment well Patient left: in chair;with call bell/phone within reach;with chair alarm set Nurse Communication: Mobility status PT Visit Diagnosis: Muscle weakness (generalized) (M62.81);Other abnormalities of gait and mobility (R26.89);Pain Pain - Right/Left: Left Pain - part of body: Hip    Time: 0820-0840 PT Time Calculation (min) (ACUTE ONLY): 20 min   Charges:   PT Evaluation $PT Eval Low Complexity: 1 Low   PT General Charges $$ ACUTE PT VISIT: 1 Visit         Rexie Catena, PT, DPT Acute Rehabilitation Office [phone]   Rexie Catena 02/20/2024, 9:13 AM

## 2024-02-20 NOTE — Progress Notes (Signed)
 Transition of Care [facility]) - CAGE-AID Screening   Patient Details  Name: Matthew Liu MRN: [id number] Date of Birth: 07/08/1941   Asa Bjork, RN Trauma Response Nurse Phone Number: 02/20/2024, 5:49 PM     CAGE-AID Screening:    Have You Ever Felt You Ought to Cut Down on Your Drinking or Drug Use?: No Have People Annoyed You By [employer] Your Drinking Or Drug Use?: No Have You Felt Bad Or Guilty About Your Drinking Or Drug Use?: No Have You Ever Had a Drink or Used Drugs First Thing In The Morning to Steady Your Nerves or to Get Rid of a Hangover?: No CAGE-AID Score: 0  Substance Abuse Education Offered: (S) No (no services needed, does not drink alcohol)

## 2024-02-20 NOTE — Progress Notes (Signed)
 Critical hemoglobin level of 6.3 received; Dr. Farrel Hones was notified at the bedside.

## 2024-02-20 NOTE — Progress Notes (Signed)
 Orthopaedic Trauma Progress Note  SUBJECTIVE: Doing okay this morning.  Just worked with physical therapy, notes some soreness in the left hip.  Is due for pain medication now if he would like it.  Denies any numbness or tingling throughout the left lower extremity.  No chest pain. No SOB. No nausea/vomiting. No other complaints.  At baseline, patient lives at home with his wife and is her primary caregiver.  He is amenable to SNF if needed.  OBJECTIVE:  Vitals:   02/19/24 1927 02/20/24 0500  BP: (!) 127/44 115/63  Pulse: (!) 106 78  Resp: 18 16  Temp:  98 F (36.7 C)  SpO2: 93% 96%    Opiates Today (MME): Today's  total administered Morphine  Milligram Equivalents: 0 Opiates Yesterday (MME): Yesterday's total administered Morphine  Milligram Equivalents: 72.5  General: Sitting in bedside chair eating breakfast, no acute distress.  Pleasant and cooperative Respiratory: No increased work of breathing.  Operative Extremity (left lower extremity): Dressings clean, dry, intact.  Soreness over the lateral hip and throughout the thigh as expected.  No significant calf tenderness.  Ankle dorsiflexion/plantarflexion intact.  Able to wiggle the toes.  Endorses sensation light touch over all aspects of the foot. + DP pulse  IMAGING: Stable post op imaging.   LABS:  Results for orders placed or performed during the hospital encounter of 02/18/24 (from the past 24 hours)  Body fluid culture w Gram Stain     Status: None (Preliminary result)   Collection Time: 02/19/24 12:48 PM   Specimen: PATH Cytology Misc. fluid; Body Fluid  Result Value Ref Range   Specimen Description KNEE LEFT    Special Requests NONE    Gram Stain      NO WBC SEEN NO ORGANISMS SEEN Performed at [facility], [address], [place], [place] [address]    Culture PENDING    Report Status PENDING   Anaerobic culture w Gram Stain     Status: None (Preliminary result)   Collection Time: 02/19/24 12:48 PM    Specimen: KNEE  Result Value Ref Range   Specimen Description KNEE LEFT    Special Requests NONE    Gram Stain      NO WBC SEEN NO ORGANISMS SEEN Performed at [facility], [address], [place], [place] [address]    Culture PENDING    Report Status PENDING   VITAMIN D  25 Hydroxy (Vit-D Deficiency, Fractures)     Status: None   Collection Time: 02/19/24  2:44 PM  Result Value Ref Range   Vit D, 25-Hydroxy 42.50 30 - 100 ng/mL  Synovial cell count + diff, w/ crystals     Status: Abnormal   Collection Time: 02/19/24  4:00 PM  Result Value Ref Range   Color, Synovial YELLOW (A) YELLOW   Appearance-Synovial HAZY (A) CLEAR   Crystals, Fluid NO CRYSTALS SEEN    WBC, Synovial 129 0 - 200 /cu mm   Neutrophil, Synovial 16 0 - 25 %   Lymphocytes-Synovial Fld 1 0 - 20 %   Monocyte-Macrophage-Synovial Fluid 83 50 - 90 %   Eosinophils-Synovial 0 0 - 1 %  Basic metabolic panel     Status: Abnormal   Collection Time: 02/20/24  7:00 AM  Result Value Ref Range   Sodium 136 135 - 145 mmol/L   Potassium 3.8 3.5 - 5.1 mmol/L   Chloride 103 98 - 111 mmol/L   CO2 24 22 - 32 mmol/L   Glucose, Bld  126 (H) 70 - 99 mg/dL   BUN 37 (H) 8 - 23 mg/dL   Creatinine, Ser 1.61 0.61 - 1.24 mg/dL   Calcium  7.9 (L) 8.9 - 10.3 mg/dL   GFR, Estimated >09 >60 mL/min   Anion gap 9 5 - 15  CBC     Status: Abnormal   Collection Time: 02/20/24  7:00 AM  Result Value Ref Range   WBC 5.8 4.0 - 10.5 K/uL   RBC 1.86 (L) 4.22 - 5.81 MIL/uL   Hemoglobin 6.3 (LL) 13.0 - 17.0 g/dL   HCT 45.4 (L) 09.8 - 11.9 %   MCV 98.4 80.0 - 100.0 fL   MCH 33.9 26.0 - 34.0 pg   MCHC 34.4 30.0 - 36.0 g/dL   RDW 14.7 82.9 - 56.2 %   Platelets 93 (L) 150 - 400 K/uL   nRBC 0.0 0.0 - 0.2 %    ASSESSMENT: Matthew Liu is a 83 y.o. male, 1 Day Post-Op s/p mechanical fall Procedures: CEPHALOMEDULLARY NAILING LEFT PROXIMAL FEMUR FRACTURE  CV/Blood loss: Acute blood loss anemia, Hgb 6.3 this AM.  1 unit PRBCs ordered per  primary team   PLAN: Weightbearing: WBAT LLE ROM: Unrestricted ROM Incisional and dressing care: Reinforce dressings as needed  Showering: Okay to begin showering getting incisions wet 02/22/2024 Orthopedic device(s): None  Pain management:  1. Tylenol  1000 mg q 8 hours scheduled 2. Robaxin 500 mg q 6 hours PRN 3. Oxycodone  2.5-5 mg q 4 hours PRN 4. Dilaudid  1 mg q 3 hours PRN 5.  Neurontin  300 mg daily VTE prophylaxis: Plavix , SCDs ID:  Ancef  2gm post op Foley/Lines:  No foley, KVO IVFs Impediments to Fracture Healing: Vit D level looks good at 42, no supplementation needed Dispo: PT/OT eval today, dispo pending. May require SNF. Continue to monitor CBC   D/C recommendations: - Oxycodone , Robaxin, Tylenol  for pain control - Plavix  for DVT prophylaxis - No additional need for Vit D supplementation  Follow - up plan: 2 weeks after d/c for wound check and repeat x-rays   Contact information:  Katheryne Pane MD, Alona Jamaica PA-C. After hours and holidays please check [email or web address] for group call information for [facility]   Edilia Gordon, PA-C [phone] (office) [email or web address]

## 2024-02-20 NOTE — Progress Notes (Signed)
 PROGRESS NOTE    Matthew Liu  HQI:[id number] DOB: [birth year] DOA: 02/18/2024 PCP: Adrian Hopper, MD  Subjective: Pt seen and examined.  Pt trying to order breakfast. No complaints. HgB down to 6.3 g/dl. Pre-op HgB 9.8 g/dl. EBL documented at 150 ml.  Awaiting repeat HgB this AM. Pt is not hypotensive.  Has not seen PT yet.  Pt agreeable to PRBC transfusion if needed. Pt denies being Jehovah's witness.   Hospital Course: 83 yo M  Fall while trying to help his disabled wife off the chair. Lost his balance, she fell on him, broke his R hip. -ortho consulted -Monday  On plavix   Negative head imaging  HPI: Matthew Liu is a 83 y.o. male with medical history significant of CAD s/p CABG x5, anemia of chronic disease, CKD 3A, dysplipidemia, HTN, hx of CVA, idiopathic pulmonary fibrosis, parkinson's disease, OSA, PAD, GERD presenting to the ED with left hip pain after sustaining a fall.    Patient reports that he was awakened by his disabled wife earlier this morning around 1AM as she was hungry. Patient brought wife to kitchen and wife subsequently ate. Once they were done, patient helped wife stand up off of the chair. However, at this time, wife suddenly lost her balance and fell backwards onto patient causing both of them to fall to the ground. Patient immediately began experiencing left hip pain after the fall.  He was able to call his daughter who subsequently called EMS.  Patient placed in leg brace and brought to the ED for further evaluation.  Patient denies any lightheadedness, dizziness, chest pain, palpitations, blurry vision prior to the fall.   ED course: Vital signs stable (variable HR due to 1st degree AV block with supraventricular tachycardia).  CBC with anemia around his baseline, otherwise unremarkable.  BMP with glucose 164, kidney function around his baseline, otherwise unremarkable.  PT/INR normal.  Chest x-ray unremarkable.  Right wrist x-ray unremarkable.  CT  head without any acute intracranial findings.  CT C-spine without any acute C-spine injury noted.  Pelvic x-ray showing a displaced proximal left femur fracture, primarily subtrochanteric.  Left hip CT showing a comminuted and displaced intertrochanteric and subtrochanteric left femur fracture without any involvement of the proximal femoral neck or femoral head.  There is presence of mild periarticular soft tissue swelling without significant focal hematoma.  Orthopedics consulted by ED provider.  [facility] hospitalist asked to evaluate patient for admission.  Significant Events: Admitted 02/18/2024 left intertrochanteric fracture   Significant Labs: WBC 10.4, HgB 11.2, plt 153 INR 1.1 Na 135, K 3.5, CO2 of 22, BUN 41, scr 1.22, glu 164 Ferritin 130 Iron 56, %sat 20, TIBC 274  Significant Imaging Studies: CXR No acute finding  Pelvic XR Displaced proximal femur fracture, primarily subtrochanteric.  Right wrist XR No acute finding  CT head/c-spine No evidence of acute intracranial or cervical spine injury.  Left hip CT Comminuted and displaced intertrochanteric and subtrochanteric left femur fracture as described. No involvement of the proximal femoral neck or femoral head. 2. No evidence of left pelvic fracture. 3. Mild periarticular soft tissue swelling without significant focal hematoma. 4.  Aortic Atherosclerosis Left knee XR Minimal joint effusion. No fracture or dislocation   Antibiotic Therapy: Anti-infectives (From admission, onward)    Start     Dose/Rate Route Frequency Ordered Stop   02/19/24 1138  ceFAZolin  (ANCEF ) 2-4 GM/100ML-% IVPB       Note to Pharmacy: [name]: cabinet override  02/19/24 1138 02/19/24 2344       Procedures:   Consultants: orthopedics    Assessment and Plan: * Closed left subtrochanteric femur fracture (HCC) 02-19-2024 Pt is s/p ORIF with IM nail left intertroch fracture. Is he WBAT as tolerating. Dtr wants pt to go to [facility] SNF at  discharge if possible. Awaiting PT evaluation. Ortho wants plavix  for DVT prophylaxis  IPF (idiopathic pulmonary fibrosis) (HCC) 02-19-2024 stable.  02-20-2024 stable.  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  Parkinson's disease (HCC) 02-19-2024 continue sinemet .  02-20-2024 continue sinemet   Coronary artery disease involving native coronary artery of native heart with unstable angina pectoris (HCC) 02-19-2024 stable. On coreg , lipitor .  02-20-2024 stable.  OSA (obstructive sleep apnea) 02-19-2024 CPAP prn.  02-20-2024 stable  Gastroesophageal reflux disease 02-19-2024 continue protonix  qday.  02-20-2024 continue protonix .  Essential hypertension 02-19-2024 continue coreg . Hold ARB and lasix  for now.  02-20-2024 on coreg  bid. Hold arb/lasix  for now.  DVT prophylaxis: SCDs Start: 02/19/24 1426  Plavix    Code Status: Full Code Family Communication: no family at bedside Disposition Plan: unknown. Possible SNF Reason for continuing need for hospitalization: awaiting PT consult. May need PRBC transfusion.  Objective: Vitals:   02/19/24 1610 02/19/24 1722 02/19/24 1927 02/20/24 0500  BP: (!) 136/57 (!) 101/51 (!) 127/44 115/63  Pulse: 63 68 (!) 106 78  Resp: 17  18 16   Temp: 97.8 F (36.6 C)   98 F (36.7 C)  TempSrc: Oral   Oral  SpO2: 100%  93% 96%  Weight:      Height:        Intake/Output Summary (Last 24 hours) at 02/20/2024 0847 Last data filed at 02/20/2024 0400 Gross per 24 hour  Intake 1831.8 ml  Output 1000 ml  Net 831.8 ml   Filed Weights   02/18/24 0548 02/19/24 0956  Weight: 61.2 kg 61.2 kg    Examination:  Physical Exam Vitals and nursing note reviewed.  Constitutional:      General: He is not in acute distress.    Appearance: He is not toxic-appearing.     Comments: Chronically ill appearing. Frail.  HENT:     Head: Normocephalic and atraumatic.     Nose: Nose normal.   Cardiovascular:     Rate and Rhythm: Normal rate and regular rhythm.  Pulmonary:     Effort: Pulmonary effort is normal. No respiratory distress.     Breath sounds: Normal breath sounds.  Abdominal:     General: Abdomen is flat. Bowel sounds are normal. There is no distension.     Palpations: Abdomen is soft.  Skin:    General: Skin is warm and dry.     Capillary Refill: Capillary refill takes less than 2 seconds.  Neurological:     Mental Status: He is alert and oriented to person, place, and time.     Data Reviewed: I have personally reviewed following labs and imaging studies  CBC: Recent Labs  Lab 02/18/24 0549 02/19/24 0456 02/20/24 0700  WBC 10.4 6.7 5.8  NEUTROABS 8.3*  --   --   HGB 11.2* 9.8* 6.3*  HCT 32.0* 27.8* 18.3*  MCV 99.1 98.6 98.4  PLT 153 124* 93*   Basic Metabolic Panel: Recent Labs  Lab 02/18/24 0549 02/19/24 0456 02/20/24 0700  NA 135 134* 136  K 3.5 4.0 3.8  CL 101 100 103  CO2 22 25 24   GLUCOSE 164* 151* 126*  BUN 41* 42*  37*  CREATININE 1.22 1.33* 1.20  CALCIUM  8.8* 8.6* 7.9*   GFR: Estimated Creatinine Clearance: 40.4 mL/min (by C-G formula based on SCr of 1.2 mg/dL). Coagulation Profile: Recent Labs  Lab 02/18/24 0549  INR 1.1   Anemia Panel: Recent Labs    02/18/24 1705  FERRITIN 130  TIBC 274  IRON 56   Recent Results (from the past 240 hours)  Body fluid culture w Gram Stain     Status: None (Preliminary result)   Collection Time: 02/19/24 12:48 PM   Specimen: PATH Cytology Misc. fluid; Body Fluid  Result Value Ref Range Status   Specimen Description KNEE LEFT  Final   Special Requests NONE  Final   Gram Stain   Final    NO WBC SEEN NO ORGANISMS SEEN Performed at [facility], [address] [place], [place] [address]    Culture PENDING  Incomplete   Report Status PENDING  Incomplete  Anaerobic culture w Gram Stain     Status: None (Preliminary result)   Collection Time: 02/19/24 12:48 PM   Specimen:  KNEE  Result Value Ref Range Status   Specimen Description KNEE LEFT  Final   Special Requests NONE  Final   Gram Stain   Final    NO WBC SEEN NO ORGANISMS SEEN Performed at [facility], [address], [place], [place] [address]    Culture PENDING  Incomplete   Report Status PENDING  Incomplete     Radiology Studies: DG FEMUR PORT MIN 2 VIEWS LEFT Result Date: 02/19/2024 CLINICAL DATA:  PACU.  Closed left subtrochanteric femoral fracture. EXAM: LEFT FEMUR PORTABLE 2 VIEWS COMPARISON:  AP pelvis and CT left hip 02/18/2024; left knee radiographs 02/18/2024 FINDINGS: Interval left femoral cephalomedullary nail fixation of the previously seen intertrochanteric and subtrochanteric fracture. Improved alignment. No perihardware lucency is seen to indicate hardware failure or loosening. Mild left femoroacetabular osteoarthritis. Mild-to-moderate medial compartment of the knee joint space narrowing. Expected postoperative changes including mild lateral proximal thigh subcutaneous air. Moderate to high-grade atherosclerotic calcifications. IMPRESSION: Interval left femoral cephalomedullary nail fixation of the previously seen intertrochanteric and subtrochanteric fracture. Improved alignment. Electronically Signed   By: Bertina Broccoli M.D.   On: 02/19/2024 17:02   DG FEMUR MIN 2 VIEWS LEFT Result Date: 02/19/2024 CLINICAL DATA:  Elective surgery. EXAM: LEFT FEMUR 2 VIEWS COMPARISON:  AP pelvis 02/18/2024 FINDINGS: Images were performed intraoperatively without the presence of a radiologist. Redemonstration of comminuted proximal left femoral prominently subtrochanteric fracture. The patient is undergoing long cephalomedullary nail fixation. No hardware complication is seen. Total fluoroscopy images: 6 Total fluoroscopy time: 119 seconds Total dose: Radiation Exposure Index (as provided by the fluoroscopic device): 13.4 mGy air Kerma Please see intraoperative findings for further detail. IMPRESSION:  Intraoperative fluoroscopy for proximal left femoral fracture fixation. Electronically Signed   By: Bertina Broccoli M.D.   On: 02/19/2024 16:59   DG C-Arm 1-60 Min-No Report Result Date: 02/19/2024 Fluoroscopy was utilized by the requesting physician.  No radiographic interpretation.   DG Knee Left Port Result Date: 02/18/2024 CLINICAL DATA:  Left femur fracture. EXAM: PORTABLE LEFT KNEE - 1-2 VIEW COMPARISON:  None Available. FINDINGS: No fracture or dislocation. Joint spaces are preserved allowing for difficulties with positioning. No focal bone abnormality or significant degenerative change. Minimal joint effusion. IMPRESSION: Minimal joint effusion. No fracture or dislocation. Electronically Signed   By: Chadwick Colonel M.D.   On: 02/18/2024 20:17   CT Hip Left Wo Contrast  Result Date: 02/18/2024 CLINICAL DATA:  Status post fall. Closed left subtrochanteric hip fracture. EXAM: CT OF THE LEFT HIP WITHOUT CONTRAST TECHNIQUE: Multidetector CT imaging of the left hip was performed according to the standard protocol. Multiplanar CT image reconstructions were also generated. RADIATION DOSE REDUCTION: This exam was performed according to the departmental dose-optimization program which includes automated exposure control, adjustment of the mA and/or kV according to patient size and/or use of iterative reconstruction technique. COMPARISON:  Pelvic radiographs same date.  Pelvic CTA 09/04/2022. FINDINGS: Bones/Joint/Cartilage The bones appear adequately mineralized. There is a comminuted and displaced intertrochanteric and subtrochanteric left femur fracture. A medial butterfly fragment involving the lesser trochanter measures up to 4.8 cm in length. The distal fragment demonstrates up to 5.2 cm of proximal displacement and apex lateral angulation. There is no involvement of the proximal femoral neck. The femoral head is located and intact. No evidence of left pelvic fracture. There are degenerative changes  within the lower lumbar spine and left sacroiliac joint. Ligaments Suboptimally assessed by CT. Muscles and Tendons Mild generalized muscular atrophy. No significant focal hematoma identified. Soft tissues Mild soft tissue swelling around the fracture, greatest anteriorly. No evidence of foreign body or soft tissue emphysema. Aortic, iliac and femoral atherosclerosis noted. There are diverticular changes within the sigmoid colon. No evidence of intrapelvic hematoma. IMPRESSION: 1. Comminuted and displaced intertrochanteric and subtrochanteric left femur fracture as described. No involvement of the proximal femoral neck or femoral head. 2. No evidence of left pelvic fracture. 3. Mild periarticular soft tissue swelling without significant focal hematoma. 4.  Aortic Atherosclerosis (ICD10-I70.0). Electronically Signed   By: Elmon Hagedorn M.D.   On: 02/18/2024 10:40    Scheduled Meds:  acetaminophen   1,000 mg Oral Q8H   atorvastatin   80 mg Oral QPM   carbidopa -levodopa   1 tablet Oral TID   carvedilol   3.125 mg Oral BID WC   clopidogrel   75 mg Oral Daily   docusate sodium   100 mg Oral BID   FLUoxetine  20 mg Oral Daily   gabapentin   300 mg Oral Daily   pantoprazole   40 mg Oral Daily   Continuous Infusions:  sodium chloride  50 mL/hr at 02/19/24 1455    ceFAZolin  (ANCEF ) IV 2 g (02/20/24 0529)     LOS: 2 days   Time spent: 50 minutes  Unk Garb, DO  [facility]  02/20/2024, 8:47 AM

## 2024-02-20 NOTE — Progress Notes (Signed)
   Repeat HgB 6.9 g/dl. Will transfuse 1 unit PRBC. Repeat CBC in AM.  Unk Garb, DO [facility]

## 2024-02-21 ENCOUNTER — Ambulatory Visit: Admitting: Student

## 2024-02-21 DIAGNOSIS — S72002A Fracture of unspecified part of neck of left femur, initial encounter for closed fracture: Secondary | ICD-10-CM | POA: Diagnosis not present

## 2024-02-21 LAB — CBC
HCT: 21.6 % — ABNORMAL LOW (ref 39.0–52.0)
Hemoglobin: 7.7 g/dL — ABNORMAL LOW (ref 13.0–17.0)
MCH: 33.8 pg (ref 26.0–34.0)
MCHC: 35.6 g/dL (ref 30.0–36.0)
MCV: 94.7 fL (ref 80.0–100.0)
Platelets: 107 10*3/uL — ABNORMAL LOW (ref 150–400)
RBC: 2.28 MIL/uL — ABNORMAL LOW (ref 4.22–5.81)
RDW: 15.2 % (ref 11.5–15.5)
WBC: 6.5 10*3/uL (ref 4.0–10.5)
nRBC: 0 % (ref 0.0–0.2)

## 2024-02-21 LAB — BASIC METABOLIC PANEL WITH GFR
Anion gap: 7 (ref 5–15)
BUN: 34 mg/dL — ABNORMAL HIGH (ref 8–23)
CO2: 25 mmol/L (ref 22–32)
Calcium: 8.1 mg/dL — ABNORMAL LOW (ref 8.9–10.3)
Chloride: 102 mmol/L (ref 98–111)
Creatinine, Ser: 1.03 mg/dL (ref 0.61–1.24)
GFR, Estimated: >60 mL/min (ref >60)
Glucose, Bld: 123 mg/dL — ABNORMAL HIGH (ref 70–99)
Potassium: 3.8 mmol/L (ref 3.5–5.1)
Sodium: 134 mmol/L — ABNORMAL LOW (ref 135–145)

## 2024-02-21 LAB — TYPE AND SCREEN
ABO/RH(D): O POS
Antibody Screen: NEGATIVE
Unit division: 0

## 2024-02-21 LAB — BPAM RBC
Blood Product Expiration Date: 202506092359
ISSUE DATE / TIME: 202505131417
Unit Type and Rh: 5100

## 2024-02-21 MED ORDER — PNEUMOCOCCAL 20-VAL CONJ VACC 0.5 ML IM SUSY
0.5000 mL | PREFILLED_SYRINGE | INTRAMUSCULAR | Status: DC
  Filled 2024-02-21: qty 0.5

## 2024-02-21 MED ORDER — OXYCODONE HCL 5 MG PO TABS: 2.5000 mg | ORAL_TABLET | Freq: Four times a day (QID) | ORAL | 0 refills | Status: DC | PRN

## 2024-02-21 NOTE — Progress Notes (Signed)
 Orthopaedic Trauma Progress Note  SUBJECTIVE: Doing okay this morning.  Pain is manageable, utilizing oxycodone  and Robaxin.  Has not required IV pain medication over the last 24 hours.  Denies any numbness or tingling throughout the left lower extremity.  No chest pain. No SOB. No nausea/vomiting. No other complaints.  Fluid that was aspirated from left knee during surgery was sent for cell count as well as culture.  Cultures with no growth to date.  No crystals seen in the fluid.    OBJECTIVE:  Vitals:   02/21/24 0435 02/21/24 0753  BP: 129/84 (!) 127/49  Pulse: 78 98  Resp: 18 17  Temp: 98.1 F (36.7 C) 98.6 F (37 C)  SpO2: 96% 97%    Opiates Today (MME): Today's  total administered Morphine  Milligram Equivalents: 0 Opiates Yesterday (MME): Yesterday's total administered Morphine  Milligram Equivalents: 15  General: Sitting up in bedside chair.  No acute distress.  Pleasant and cooperative Respiratory: No increased work of breathing.  Operative Extremity (left lower extremity): Dressings clean, dry, intact.  Soreness over the lateral hip and throughout the thigh as expected.  No significant calf tenderness.  Ankle dorsiflexion/plantarflexion intact.  Able to wiggle the toes.  Endorses sensation to light touch over all aspects of the foot. + DP pulse  IMAGING: Stable post op imaging.   LABS:  Results for orders placed or performed during the hospital encounter of 02/18/24 (from the past 24 hours)  Prepare RBC (crossmatch)     Status: None   Collection Time: 02/20/24 10:36 AM  Result Value Ref Range   Order Confirmation      ORDER PROCESSED BY BLOOD BANK Performed at [facility], [address], [place], [place] [address]   CBC     Status: Abnormal   Collection Time: 02/21/24  6:50 AM  Result Value Ref Range   WBC 6.5 4.0 - 10.5 K/uL   RBC 2.28 (L) 4.22 - 5.81 MIL/uL   Hemoglobin 7.7 (L) 13.0 - 17.0 g/dL   HCT 60.4 (L) 54.0 - 98.1 %   MCV 94.7 80.0 - 100.0 fL   MCH  33.8 26.0 - 34.0 pg   MCHC 35.6 30.0 - 36.0 g/dL   RDW 19.1 47.8 - 29.5 %   Platelets 107 (L) 150 - 400 K/uL   nRBC 0.0 0.0 - 0.2 %    ASSESSMENT: Matthew Liu is a 83 y.o. male, 2 Days Post-Op s/p mechanical fall Procedures: CEPHALOMEDULLARY NAILING LEFT PROXIMAL FEMUR FRACTURE  CV/Blood loss: Acute blood loss anemia, Hgb 7.7 this morning.  Received 1 unit PRBCs 02/20/2024.  Hemodynamically stable this morning  PLAN: Weightbearing: WBAT LLE ROM: Unrestricted ROM Incisional and dressing care: Daily dressing changes as needed starting today Showering: Okay to begin showering and getting incisions wet 02/22/2024 Orthopedic device(s): None  Pain management:  1. Tylenol  1000 mg q 8 hours scheduled 2. Robaxin 500 mg q 6 hours PRN 3. Oxycodone  2.5-5 mg q 4 hours PRN 4. Dilaudid  1 mg q 3 hours PRN 5. Neurontin  300 mg daily VTE prophylaxis: Plavix , SCDs ID:  Ancef  2gm post op completed Foley/Lines:  No foley, KVO IVFs Impediments to Fracture Healing: Vit D level looks good at 42, no supplementation needed Dispo: PT/OT eval ongoing, dispo pending. May require SNF which patient is agreeable to. Continue to monitor CBC. Ok for d/c from ortho standpoint once cleared by medicine team. I have signed and placed d/c rx for pain medication in patient's chart  D/C recommendations: -  Oxycodone , home dose Tizanidine, Tylenol  for pain control - home dose Plavix  for DVT prophylaxis - No additional need for Vit D supplementation  Follow - up plan: 2 weeks after d/c for wound check and repeat x-rays   Contact information:  Katheryne Pane MD, Alona Jamaica PA-C. After hours and holidays please check [email or web address] for group call information for [facility]   Edilia Gordon, PA-C [phone] (office) [email or web address]

## 2024-02-21 NOTE — Evaluation (Signed)
 Occupational Therapy Evaluation Patient Details Name: Matthew Liu MRN: [id number] DOB: May 04, 1941 Today's Date: 02/21/2024   History of Present Illness   83 y.o. male presents to [facility] on 02/18/2024 after falling when assisting his wife. Pt found to have L hip fx, underwent cephalomedullary nailing of L femur on 5/12. PMH includes CABG x 5, CKD III, dyslipidemia, HTN, CVA, pulmonary fibrosis, Parkinson's disease, OSA, GERD, PAD.     Clinical Impressions Pt admitted based on above, and was seen based on problem list below. PTA pt was living with his spouse to whom he is the primary caregiver for. He was independent with ADLs and IADLs. Today pt is requiring set up  to mod assist for ADLs. Bed mobility was min assist and functional transfers are  mod assist. Pt limited by pain and fatigue during mobility. Moments of freezing requiring increased assistance, but participated well. Recommendation of <3 hours of skilled rehab daily. OT will continue to follow acutely to maximize functional independence.        If plan is discharge home, recommend the following:   A lot of help with walking and/or transfers;A lot of help with bathing/dressing/bathroom     Functional Status Assessment   Patient has had a recent decline in their functional status and demonstrates the ability to make significant improvements in function in a reasonable and predictable amount of time.     Equipment Recommendations   Other (comment) (Defer to next venue)     Recommendations for Other Services         Precautions/Restrictions   Precautions Precautions: Fall Recall of Precautions/Restrictions: Intact Restrictions Weight Bearing Restrictions Per Provider Order: Yes LLE Weight Bearing Per Provider Order: Weight bearing as tolerated     Mobility Bed Mobility Overal bed mobility: Needs Assistance Bed Mobility: Supine to Sit     Supine to sit: Min assist     General bed mobility  comments: min assist for LLE, cues for sequencing    Transfers Overall transfer level: Needs assistance Equipment used: Rolling walker (2 wheels) Transfers: Sit to/from Stand, Bed to chair/wheelchair/BSC Sit to Stand: Min assist     Step pivot transfers: Mod assist     General transfer comment: Mod assist to steady d/t fatigue, moments of freezing      Balance Overall balance assessment: Needs assistance Sitting-balance support: No upper extremity supported, Feet supported Sitting balance-Leahy Scale: Fair     Standing balance support: Bilateral upper extremity supported, Reliant on assistive device for balance Standing balance-Leahy Scale: Poor         ADL either performed or assessed with clinical judgement   ADL Overall ADL's : Needs assistance/impaired Eating/Feeding: Set up;Sitting   Grooming: Wash/dry face;Oral care;Set up;Sitting           Upper Body Dressing : Set up;Sitting   Lower Body Dressing: Moderate assistance;Sit to/from stand Lower Body Dressing Details (indicate cue type and reason): Min assist for socks in sitting, mod for pants with standing Toilet Transfer: Moderate assistance;Ambulation;Rolling walker (2 wheels) Toilet Transfer Details (indicate cue type and reason): Simulated in room, increased time, moments of freezing         Functional mobility during ADLs: Moderate assistance;Rolling walker (2 wheels)       Vision Baseline Vision/History: 0 No visual deficits Vision Assessment?: No apparent visual deficits            Pertinent Vitals/Pain Pain Assessment Pain Assessment: 0-10 Pain Score: 8  Pain Location: L side of body  Pain Descriptors / Indicators: Sore Pain Intervention(s): Repositioned     Extremity/Trunk Assessment Upper Extremity Assessment Upper Extremity Assessment: Generalized weakness   Lower Extremity Assessment Lower Extremity Assessment: Defer to PT evaluation   Cervical / Trunk Assessment Cervical /  Trunk Assessment: Kyphotic   Communication Communication Communication: No apparent difficulties Factors Affecting Communication: Hearing impaired   Cognition Arousal: Alert Behavior During Therapy: WFL for tasks assessed/performed Cognition: No apparent impairments       Following commands: Intact       Cueing  General Comments   Cueing Techniques: Verbal cues  VSS on RA           Home Living Family/patient expects to be discharged to:: Private residence Living Arrangements: Spouse/significant other Available Help at Discharge: Family;Available PRN/intermittently Type of Home: House Home Access: Ramped entrance     Home Layout: One level     Bathroom Shower/Tub: Chief Strategy Officer: Standard Bathroom Accessibility: Yes How Accessible: Accessible via walker Home Equipment: Rolling Walker (2 wheels);Cane - single point;Wheelchair - manual          Prior Functioning/Environment Prior Level of Function : Independent/Modified Independent;Driving       Mobility Comments: ambulating independently, primary caregiver for his spouse who has dementia      OT Problem List: Decreased strength;Decreased range of motion;Decreased activity tolerance;Impaired balance (sitting and/or standing);Decreased safety awareness   OT Treatment/Interventions: Self-care/ADL training;Therapeutic exercise;DME and/or AE instruction;Therapeutic activities;Patient/family education;Balance training      OT Goals(Current goals can be found in the care plan section)   Acute Rehab OT Goals Patient Stated Goal: To receive pain meds OT Goal Formulation: With patient Time For Goal Achievement: 03/06/24 Potential to Achieve Goals: Good   OT Frequency:  Min 2X/week       AM-PAC OT "6 Clicks" Daily Activity     Outcome Measure Help from another person eating meals?: None Help from another person taking care of personal grooming?: A Little Help from another person  toileting, which includes using toliet, bedpan, or urinal?: A Lot Help from another person bathing (including washing, rinsing, drying)?: A Lot Help from another person to put on and taking off regular upper body clothing?: A Little Help from another person to put on and taking off regular lower body clothing?: A Lot 6 Click Score: 16   End of Session Equipment Utilized During Treatment: Rolling walker (2 wheels);Gait belt Nurse Communication: Mobility status;Patient requests pain meds  Activity Tolerance: Patient limited by pain Patient left: in chair;with call bell/phone within reach;with chair alarm set  OT Visit Diagnosis: Unsteadiness on feet (R26.81);Other abnormalities of gait and mobility (R26.89);Muscle weakness (generalized) (M62.81)                Time: 8119-1478 OT Time Calculation (min): 29 min Charges:  OT General Charges $OT Visit: 1 Visit OT Evaluation $OT Eval Moderate Complexity: 1 Mod  Delmer Ferraris, OT  Acute Rehabilitation Services Office [phone] Secure chat preferred   Mickael Alamo 02/21/2024, 8:35 AM

## 2024-02-21 NOTE — TOC Initial Note (Addendum)
 Transition of Care [facility]) - Initial/Assessment Note    Patient Details  Name: Matthew Liu MRN: [id number] Date of Birth: [birth year]  [facility] CM/SW Contact:    Elspeth Hals, LCSW Phone Number: 02/21/2024, 10:35 AM  Clinical Narrative:        CSW met with pt regarding PT recommendation for SNF.  Pt oriented x4, able to participate in conversation with some hesitation: unable to formulate a goal when asked.  Pt lives with wife Elsa Halls with daughter Edwina Gram across the street.  No current services.  Permission given to speak with both of them.     Pt is agreeable to SNF placement, reports wife and daughter will be here shortly as well to discuss plan.  CSW spoke with daughter Edwina Gram by phone and she also is in favor of plan for SNF, requesting [facility].  Referral sent out in hub to [facility], CSW reached out to [place] to review.  [facility] requires additional info.     1210: [facility] does offer.  CSW spoke with pt, wife, daughter and they do want to accept.    SNF auth request submitted in navi and approved: [id number], 3 days: 5/14-5/16.  PAS[facility] docs uploaded.      1230: [facility] cannot receive pt until tomorrow.    Expected Discharge Plan: Skilled Nursing Facility Barriers to Discharge: Continued Medical Work up, SNF Pending bed offer   Patient Goals and CMS Choice Patient states their goals for this hospitalization and ongoing recovery are:: pt unable to state goal   Choice offered to / list presented to : Adult Children (daughter Edwina Gram)      Expected Discharge Plan and Services In-house Referral: Clinical Social Work   Post Acute Care Choice: Skilled Nursing Facility Living arrangements for the past 2 months: Single Family Home                                      Prior Living Arrangements/Services Living arrangements for the past 2 months: Single Family Home Lives with:: Spouse Patient language and need for interpreter reviewed:: Yes Do you feel safe  going back to the place where you live?: Yes      Need for Family Participation in Patient Care: Yes (Comment) Care giver support system in place?: Yes (comment) Current home services: Other (comment) (none) Criminal Activity/Legal Involvement Pertinent to Current Situation/Hospitalization: No - Comment as needed  Activities of Daily Living   ADL Screening (condition at time of admission) Independently performs ADLs?: No Does the patient have a NEW difficulty with bathing/dressing/toileting/self-feeding that is expected to last >3 days?: Yes (Initiates electronic notice to provider for possible OT consult) Does the patient have a NEW difficulty with getting in/out of bed, walking, or climbing stairs that is expected to last >3 days?: Yes (Initiates electronic notice to provider for possible PT consult) Does the patient have a NEW difficulty with communication that is expected to last >3 days?: No Is the patient deaf or have difficulty hearing?: Yes Does the patient have difficulty seeing, even when wearing glasses/contacts?: No Does the patient have difficulty concentrating, remembering, or making decisions?: No  Permission Sought/Granted Permission sought to share information with : Family Supports Permission granted to share information with : Yes, Verbal Permission Granted  Share Information with NAME: daughter Edwina Gram, wife Elsa Halls  Permission granted to share info w AGENCY: SNF  Emotional Assessment Appearance:: Appears stated age Attitude/Demeanor/Rapport: Engaged Affect (typically observed): Appropriate, Pleasant Orientation: : Oriented to Self, Oriented to Place, Oriented to  Time, Oriented to Situation      Admission diagnosis:  Closed left subtrochanteric femur fracture (HCC) [S72.22XA] Closed fracture of left hip, initial encounter (HCC) [S72.002A] Injury of head, initial encounter [S09.90XA] Fall, initial encounter [W19.XXXA] Patient Active Problem List   Diagnosis  Date Noted   Closed left subtrochanteric femur fracture (HCC) 02/18/2024   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/2023   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   Generalized weakness 03/24/2022   Fatigue 03/24/2022   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   Parkinson's disease (HCC) 07/27/2021   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   Excessive daytime sleepiness 06/04/2021   Tremors of nervous system 03/18/2021   Memory loss 03/18/2021   OSA (obstructive sleep apnea) 11/30/2020   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   Coronary artery disease involving native coronary artery of native heart with unstable angina pectoris (HCC) 12/11/2018   Lumbar compression fracture (HCC) 01/09/2018   PAD (peripheral artery disease) (HCC) 10/13/2015   History of CVA (cerebrovascular accident) 09/15/2015   Erectile dysfunction 12/01/2014   Aspirin  allergy 07/16/2013   Dyslipidemia 07/16/2013   Essential hypertension 07/13/2013   Gastroesophageal reflux disease 07/13/2013   Barrett's esophagus 07/13/2013   PCP:  Adrian Hopper, MD Pharmacy:   [facility] [phone] [place], [place] - [address] [place] [address] Phone: [phone] Fax: [phone]  [facility] - [place], [place] - [address], [address] [address] [place], [address] [place] [address] Phone: [phone] Fax: [phone]  [place] [facility] [address] [place] [address] Phone: [phone] Fax: [phone]     Social Drivers of Health (SDOH) Social History: SDOH Screenings   Food Insecurity: No Food Insecurity (02/18/2024)  Housing: Low Risk  (02/18/2024)  Transportation Needs: No Transportation Needs (02/18/2024)  Utilities: Not At Risk (02/18/2024)  Social Connections: Moderately Integrated (02/18/2024)  Tobacco Use: Low Risk  (02/19/2024)   SDOH Interventions:     Readmission Risk Interventions     No  data to display

## 2024-02-21 NOTE — Progress Notes (Signed)
 Physical Therapy Treatment Patient Details Name: Matthew Liu MRN: [id number] DOB: 18-Dec-1940 Today's Date: 02/21/2024   History of Present Illness 83 y.o. male presents to [facility] hospital on 02/18/2024 after falling when assisting his wife. Pt found to have L hip fx, underwent cephalomedullary nailing of L femur on 5/12. PMH includes CABG x 5, CKD III, dyslipidemia, HTN, CVA, pulmonary fibrosis, Parkinson's disease, OSA, GERD, PAD.    PT Comments  Pt tolerates treatment well, ambulating for increased distances and tolerating multiple transfers. Pt denies pain at rest but does report pain with mobility in multiple joints. PT provides education on available pain medications and that the pt must request them if he feels they are needed. Pt demonstrates increased difficulty transitioning from sit to stand this session, with physical assistance needed to facilitate anterior weight shift. Patient will benefit from continued inpatient follow up therapy, <3 hours/day.    If plan is discharge home, recommend the following: A little help with walking and/or transfers;A little help with bathing/dressing/bathroom;Assistance with cooking/housework;Help with stairs or ramp for entrance;Assist for transportation   Can travel by private vehicle     Yes  Equipment Recommendations  BSC/3in1;Rolling walker (2 wheels)    Recommendations for Other Services       Precautions / Restrictions Precautions Precautions: Fall Recall of Precautions/Restrictions: Intact Restrictions Weight Bearing Restrictions Per Provider Order: Yes LLE Weight Bearing Per Provider Order: Weight bearing as tolerated     Mobility  Bed Mobility Overal bed mobility: Needs Assistance Bed Mobility: Sit to Supine       Sit to supine: Mod assist   General bed mobility comments: assist for BLE management    Transfers Overall transfer level: Needs assistance Equipment used: Rolling walker (2 wheels) Transfers: Sit to/from  Stand Sit to Stand: Mod assist   Step pivot transfers: Mod assist       General transfer comment: cues for hand placement, physical assistance for anterior weight shift    Ambulation/Gait Ambulation/Gait assistance: Min assist Gait Distance (Feet): 15 Feet Assistive device: Rolling walker (2 wheels) Gait Pattern/deviations: Step-to pattern Gait velocity: reduced Gait velocity interpretation: <1.31 ft/sec, indicative of household ambulator   General Gait Details: slowed step-to gait, reduced stance time on LLE. PT notes increased tremors of BUE when ambulating initially on uneven bathroom surface, this improves when on flat surface outside of bathroom   Stairs             Wheelchair Mobility     Tilt Bed    Modified Rankin (Stroke Patients Only)       Balance Overall balance assessment: Needs assistance Sitting-balance support: No upper extremity supported, Feet supported Sitting balance-Leahy Scale: Fair     Standing balance support: Bilateral upper extremity supported, Reliant on assistive device for balance Standing balance-Leahy Scale: Poor                              Communication Communication Communication: No apparent difficulties  Cognition Arousal: Alert Behavior During Therapy: WFL for tasks assessed/performed   PT - Cognitive impairments: Problem solving                         Following commands: Intact      Cueing Cueing Techniques: Verbal cues  Exercises General Exercises - Lower Extremity Heel Slides:  (PT encourages AROM as tolerated)    General Comments General comments (skin integrity, edema, etc.): VSS on  RA      Pertinent Vitals/Pain Pain Assessment Pain Assessment: Faces Faces Pain Scale: Hurts even more Pain Location: L elbow, knees, hips Pain Descriptors / Indicators: Sore Pain Intervention(s): Monitored during session    Home Living                          Prior Function             PT Goals (current goals can now be found in the care plan section) Acute Rehab PT Goals Patient Stated Goal: to return to independence Progress towards PT goals: Progressing toward goals    Frequency    Min 3X/week      PT Plan      Co-evaluation              AM-PAC PT "6 Clicks" Mobility   Outcome Measure  Help needed turning from your back to your side while in a flat bed without using bedrails?: A Little Help needed moving from lying on your back to sitting on the side of a flat bed without using bedrails?: A Lot Help needed moving to and from a bed to a chair (including a wheelchair)?: A Lot Help needed standing up from a chair using your arms (e.g., wheelchair or bedside chair)?: A Lot Help needed to walk in hospital room?: A Lot Help needed climbing 3-5 steps with a railing? : Total 6 Click Score: 12    End of Session Equipment Utilized During Treatment: Gait belt Activity Tolerance: Patient tolerated treatment well Patient left: in bed;with call bell/phone within reach;with bed alarm set;with family/visitor present Nurse Communication: Mobility status PT Visit Diagnosis: Muscle weakness (generalized) (M62.81);Other abnormalities of gait and mobility (R26.89);Pain Pain - Right/Left: Left Pain - part of body: Hip     Time: 1120-1150 PT Time Calculation (min) (ACUTE ONLY): 30 min  Charges:    $Gait Training: 8-22 mins $Therapeutic Activity: 8-22 mins PT General Charges $$ ACUTE PT VISIT: 1 Visit                     Rexie Catena, PT, DPT Acute Rehabilitation Office [phone]    Rexie Catena 02/21/2024, 12:39 PM

## 2024-02-21 NOTE — Care Management Important Message (Signed)
 Important Message  Patient Details  Name: Matthew Liu MRN: [id number] Date of Birth: [birth year]   Important Message Given:  Yes - Medicare IM     Felix Host 02/21/2024, 11:54 AM

## 2024-02-21 NOTE — Progress Notes (Signed)
 RE:  Matthew Liu       Date of Birth: 26-Sep-2041      Date:   02/21/24       To Whom It May Concern:  Please be advised that the above-named patient will require a short-term nursing home stay - anticipated 30 days or less for rehabilitation and strengthening.  The plan is for return home.                 MD signature                Date

## 2024-02-21 NOTE — Progress Notes (Signed)
 PROGRESS NOTE    Matthew Liu  GNF:[id number] DOB: 06-16-1941 DOA: 02/18/2024 PCP: Adrian Hopper, MD   Brief Narrative:  83 y.o. male with medical history significant of CAD s/p CABG x5, anemia of chronic disease, CKD 3A, dysplipidemia, HTN, hx of CVA, idiopathic pulmonary fibrosis, parkinson's disease, OSA, PAD, GERD presenting to the ED with left hip pain after sustaining a fall.    Patient reports that he was awakened by his disabled wife earlier this morning around 1AM as she was hungry. Patient brought wife to kitchen and wife subsequently ate. Once they were done, patient helped wife stand up off of the chair. However, at this time, wife suddenly lost her balance and fell backwards onto patient causing both of them to fall to the ground. Patient immediately began experiencing left hip pain after the fall.  He was able to call his daughter who subsequently called EMS.  Patient placed in leg brace and brought to the ED for further evaluation.  Patient denies any lightheadedness, dizziness, chest pain, palpitations, blurry vision prior to the fall.   ED course: Vital signs stable (variable HR due to 1st degree AV block with supraventricular tachycardia).  CBC with anemia around his baseline, otherwise unremarkable.  BMP with glucose 164, kidney function around his baseline, otherwise unremarkable.  PT/INR normal.  Chest x-ray unremarkable.  Right wrist x-ray unremarkable.  CT head without any acute intracranial findings.  CT C-spine without any acute C-spine injury noted.  Pelvic x-ray showing a displaced proximal left femur fracture, primarily subtrochanteric.  Left hip CT showing a comminuted and displaced intertrochanteric and subtrochanteric left femur fracture without any involvement of the proximal femoral neck or femoral head.  There is presence of mild periarticular soft tissue swelling without significant focal hematoma.  Orthopedics consulted by ED provider.  [facility] hospitalist asked to  evaluate patient for admission.  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 Is he WBAT as tolerating. Dtr wants pt to go to [facility] at discharge if possible. Ortho wants plavix  for DVT prophylaxis ABLA -s/p 1 UNIT PRBC 5/13 IPF (idiopathic pulmonary fibrosis) (HCC) On RA stable. Mild neurocognitive disorder due to Parkinson's disease Parkinson's disease (HCC)  continue sinemet .  Coronary artery disease involving native coronary artery of native heart with unstable angina pectoris (HCC)  stable. On coreg , lipitor . OSA (obstructive sleep apnea) CPAP prn.  Gastroesophageal reflux disease continue protonix  qday. Essential hypertension continue coreg . Hold ARB and lasix  for now.  Estimated body mass index is 21.13 kg/m as calculated from the following:   Height as of this encounter: 5\' 7"  (1.702 m).   Weight as of this encounter: 61.2 kg.  DVT prophylaxis:plavix  Code Status:full Family Communication:none Disposition Plan:  Status is: Inpatient Remains inpatient appropriate because: await snf   Consultants:  ortho  Procedures: hip orif Antimicrobials:none  Subjective: Working with PT very weak  Objective: Vitals:   02/20/24 1651 02/20/24 2100 02/21/24 0435 02/21/24 0753  BP: 128/66 118/77 129/84 (!) 127/49  Pulse: 84 89  78 98  Resp: 16 16 18 17   Temp: 98.4 F (36.9 C) 98.1 F (36.7 C) 98.1 F (36.7 C) 98.6 F (37 C)  TempSrc: Oral Oral Oral Oral   SpO2: 100% 99% 96% 97%  Weight:      Height:        Intake/Output Summary (Last 24 hours) at 02/21/2024 1127 Last data filed at 02/21/2024 0745 Gross per 24 hour  Intake 582.83 ml  Output 1200 ml  Net -617.17 ml   Filed Weights   02/18/24 0548 02/19/24 0956  Weight: 61.2 kg 61.2 kg    Examination:  General exam: Appears in nad  Respiratory system: Clear to auscultation. Respiratory effort normal. Cardiovascular system: S1 & S2 heard, RRR. No JVD, murmurs, rubs, gallops or clicks. No pedal edema. Gastrointestinal system: Abdomen is nondistended, soft and nontender. No organomegaly or masses felt. Normal bowel sounds heard. Central nervous system: Alert and oriented. No focal neurological deficits. Extremities: hip incision cdi   Data Reviewed: I have personally reviewed following labs and imaging studies  CBC: Recent Labs  Lab 02/18/24 0549 02/19/24 0456 02/20/24 0700 02/20/24 0812 02/21/24 0650  WBC 10.4 6.7 5.8  --  6.5  NEUTROABS 8.3*  --   --   --   --   HGB 11.2* 9.8* 6.3* 6.9* 7.7*  HCT 32.0* 27.8* 18.3* 20.2* 21.6*  MCV 99.1 98.6 98.4  --  94.7  PLT 153 124* 93*  --  107*   Basic Metabolic Panel: Recent Labs  Lab 02/18/24 0549 02/19/24 0456 02/20/24 0700 02/21/24 0650  NA 135 134* 136 134*  K 3.5 4.0 3.8 3.8  CL 101 100 103 102  CO2 22 25 24 25   GLUCOSE 164* 151* 126* 123*  BUN 41* 42* 37* 34*  CREATININE 1.22 1.33* 1.20 1.03  CALCIUM  8.8* 8.6* 7.9* 8.1*   GFR: Estimated Creatinine Clearance: 46.2 mL/min (by C-G formula based on SCr of 1.03 mg/dL). Liver Function Tests: No results for input(s): "AST", "ALT", "ALKPHOS", "BILITOT", "PROT", "ALBUMIN " in the last 168 hours. No results for input(s): "LIPASE", "AMYLASE" in the last 168 hours. No results for input(s): "AMMONIA" in the last 168 hours. Coagulation Profile: Recent Labs  Lab 02/18/24 0549  INR 1.1   Cardiac Enzymes: No results for input(s): "CKTOTAL", "CKMB", "CKMBINDEX",  "TROPONINI" in the last 168 hours. BNP (last 3 results) No results for input(s): "PROBNP" in the last 8760 hours. HbA1C: No results for input(s): "HGBA1C" in the last 72 hours. CBG: No results for input(s): "GLUCAP" in the last 168 hours. Lipid Profile: No results for input(s): "CHOL", "HDL", "LDLCALC", "TRIG", "CHOLHDL", "LDLDIRECT" in the last 72 hours. Thyroid  Function Tests: No results for input(s): "TSH", "T4TOTAL", "FREET4", "T3FREE", "THYROIDAB" in the last 72 hours. Anemia Panel: Recent Labs    02/18/24 1705  FERRITIN 130  TIBC 274  IRON 56   Sepsis Labs: No results for input(s): "PROCALCITON", "LATICACIDVEN" in the last 168 hours.  Recent Results (from the past 240 hours)  Body fluid culture w Gram Stain     Status: None (Preliminary result)   Collection Time: 02/19/24 12:48 PM   Specimen: PATH Cytology Misc. fluid; Body Fluid  Result Value Ref Range Status   Specimen Description KNEE LEFT  Final   Special Requests NONE  Final   Gram Stain NO WBC SEEN NO ORGANISMS SEEN   Final   Culture   Final    NO GROWTH 2 DAYS Performed at [facility], [address], [place], [place] [address]    Report Status PENDING  Incomplete  Anaerobic culture w Gram Stain     Status: None (Preliminary result)   Collection Time: 02/19/24 12:48 PM   Specimen: KNEE  Result Value Ref Range Status   Specimen Description KNEE LEFT  Final   Special Requests NONE  Final   Gram Stain   Final    NO WBC SEEN NO ORGANISMS SEEN Performed at [facility], [address] [place], [place] [address]    Culture PENDING  Incomplete   Report Status PENDING  Incomplete         Radiology Studies: DG FEMUR PORT MIN 2 VIEWS LEFT Result Date: 02/19/2024 CLINICAL DATA:  PACU.  Closed left subtrochanteric femoral fracture. EXAM: LEFT FEMUR PORTABLE 2 VIEWS COMPARISON:  AP pelvis and CT left hip 02/18/2024; left knee radiographs 02/18/2024 FINDINGS: Interval left femoral  cephalomedullary nail fixation of the previously seen intertrochanteric and subtrochanteric fracture. Improved alignment. No perihardware lucency is seen to indicate hardware failure or loosening. Mild left femoroacetabular osteoarthritis. Mild-to-moderate medial compartment of the knee joint space narrowing. Expected postoperative changes including mild lateral proximal thigh subcutaneous air. Moderate to high-grade atherosclerotic calcifications. IMPRESSION: Interval left femoral cephalomedullary nail fixation of the previously seen intertrochanteric and subtrochanteric fracture. Improved alignment. Electronically Signed   By: Bertina Broccoli M.D.   On: 02/19/2024 17:02   DG FEMUR MIN 2 VIEWS LEFT Result Date: 02/19/2024 CLINICAL DATA:  Elective surgery. EXAM: LEFT FEMUR 2 VIEWS COMPARISON:  AP pelvis 02/18/2024 FINDINGS: Images were performed intraoperatively without the presence of a radiologist. Redemonstration of comminuted proximal left femoral prominently subtrochanteric fracture. The patient is undergoing long cephalomedullary nail fixation. No hardware complication is seen. Total fluoroscopy images: 6 Total fluoroscopy time: 119 seconds Total dose: Radiation Exposure Index (as provided by the fluoroscopic device): 13.4 mGy air Kerma Please see intraoperative findings for further detail. IMPRESSION: Intraoperative fluoroscopy for proximal left femoral fracture fixation. Electronically Signed   By: Bertina Broccoli M.D.   On: 02/19/2024 16:59   DG C-Arm 1-60 Min-No Report Result Date: 02/19/2024 Fluoroscopy was utilized by the requesting physician.  No radiographic interpretation.    Scheduled Meds:  acetaminophen   1,000 mg Oral Q8H   atorvastatin   80 mg Oral QPM   carbidopa -levodopa   1 tablet Oral TID   carvedilol   3.125 mg Oral BID WC   clopidogrel   75 mg Oral Daily   docusate sodium   100 mg Oral BID   gabapentin   300 mg Oral Daily   pantoprazole   40 mg Oral Daily   Continuous Infusions:   sodium chloride  Stopped (02/21/24 0943)     LOS: 3 days    Time spent: 50 min  Barbee Lew, MD 02/21/2024, 11:27 AM

## 2024-02-21 NOTE — Plan of Care (Signed)
  Problem: Clinical Measurements: Goal: Diagnostic test results will improve Outcome: Not Progressing   Problem: Activity: Goal: Risk for activity intolerance will decrease Outcome: Not Progressing   Problem: Pain Managment: Goal: General experience of comfort will improve and/or be controlled Outcome: Not Progressing   Problem: Safety: Goal: Ability to remain free from injury will improve Outcome: Not Progressing

## 2024-02-21 NOTE — NC FL2 (Signed)
 [place]  MEDICAID FL2 LEVEL OF CARE FORM     IDENTIFICATION  Patient Name: Matthew Liu Birthdate: 1940-12-23 Sex: male Admission Date (Current Location): 02/18/2024  [facility] and [place] Number:  Producer, television/film/video and Address:  The . [facility], 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   Generalized weakness 03/24/2022   Fatigue 03/24/2022   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   Parkinson's disease (HCC) 07/27/2021   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   Excessive daytime sleepiness 06/04/2021   Tremors of nervous system 03/18/2021   Memory loss 03/18/2021   OSA (obstructive sleep apnea) 11/30/2020   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   Coronary artery disease involving native coronary artery of native heart with unstable angina pectoris (HCC) 12/11/2018   Lumbar compression fracture (HCC) 01/09/2018   PAD (peripheral artery disease) (HCC) 10/13/2015   History of CVA (cerebrovascular accident) 09/15/2015   Erectile dysfunction 12/01/2014   Aspirin  allergy 07/16/2013   Dyslipidemia 07/16/2013   Essential hypertension 07/13/2013   Gastroesophageal reflux disease 07/13/2013   Barrett's esophagus 07/13/2013    Orientation RESPIRATION BLADDER Height & Weight     Self, Time, Situation, Place  Normal Continent Weight: 134 lb 14.7 oz (61.2  kg) Height:  5\' 7"  (170.2 cm)  BEHAVIORAL SYMPTOMS/MOOD NEUROLOGICAL BOWEL NUTRITION STATUS      Continent Diet (see discharge summary)  AMBULATORY STATUS COMMUNICATION OF NEEDS Skin   Limited Assist Verbally Surgical wounds, Skin abrasions, Other (Comment) (redness, ecchymosis)                       Personal Care Assistance Level of Assistance  Bathing, Feeding, Dressing Bathing Assistance: Limited assistance Feeding assistance: Limited assistance Dressing Assistance: Limited assistance     Functional Limitations Info  Sight, Hearing, Speech Sight Info: Adequate Hearing Info: Adequate Speech Info: Adequate    SPECIAL CARE FACTORS FREQUENCY  PT (By licensed PT), OT (By licensed OT)     PT Frequency: 5x week OT Frequency: 5x week            Contractures Contractures Info: Not present    Additional Factors Info  Code Status, Allergies Code Status Info: full Allergies Info: Aspirin , Quinine Derivatives           Current Medications (02/21/2024):  This is the current hospital active medication list Current Facility-Administered Medications  Medication Dose Route Frequency Provider Last Rate Last Admin   0.9 %  sodium chloride  infusion   Intravenous Continuous Versie Gores, PA-C   Stopping previously hung infusion at 02/21/24 0943   acetaminophen  (TYLENOL ) tablet 1,000 mg  1,000 mg Oral Q8H McClung, Sarah A, PA-C   1,000 mg at 02/21/24 0943   ALPRAZolam  (XANAX ) tablet 0.25 mg  0.25 mg Oral BID PRN Versie Gores, PA-C       atorvastatin  (LIPITOR ) tablet 80 mg  80 mg Oral QPM Versie Gores, PA-C   80 mg at 02/20/24 1703   carbidopa -levodopa  (SINEMET  IR) 25-100 MG per tablet immediate release 1 tablet  1 tablet Oral TID Versie Gores, PA-C   1 tablet at 02/21/24 0865   carvedilol  (COREG ) tablet 3.125 mg  3.125 mg Oral BID WC Unk Garb, DO   3.125 mg at 02/21/24 7846   clopidogrel  (PLAVIX ) tablet 75 mg  75 mg Oral Daily Versie Gores, PA-C   75 mg at  02/21/24 0942   diphenhydrAMINE (BENADRYL) 12.5 MG/5ML elixir 12.5-25 mg  12.5-25 mg Oral Q4H PRN Versie Gores, PA-C       docusate sodium  (COLACE) capsule 100 mg  100 mg Oral BID Versie Gores, PA-C   100 mg at 02/21/24 9629   gabapentin  (NEURONTIN ) capsule 300 mg  300 mg Oral Daily Versie Gores, PA-C   300 mg at 02/21/24 5284   hydrALAZINE (APRESOLINE) tablet 10 mg  10 mg Oral Q6H PRN Versie Gores, PA-C       HYDROmorphone  (DILAUDID ) injection 1 mg  1 mg Intravenous Q3H PRN Versie Gores, PA-C   1 mg at 02/19/24 0844   methocarbamol (ROBAXIN) tablet 500 mg  500 mg Oral Q6H PRN Versie Gores, PA-C   500 mg at 02/21/24 0942   Or   methocarbamol (ROBAXIN) injection 500 mg  500 mg Intravenous Q6H PRN Versie Gores, PA-C       metoCLOPramide (REGLAN) tablet 5-10 mg  5-10 mg Oral Q8H PRN Jonelle Neri, Sarah A, PA-C       Or   metoCLOPramide (REGLAN) injection 5-10 mg  5-10 mg Intravenous Q8H PRN Versie Gores, PA-C       ondansetron  (ZOFRAN ) tablet 4 mg  4 mg Oral Q6H PRN Jonelle Neri, Sarah A, PA-C       Or   ondansetron  (ZOFRAN ) injection 4 mg  4 mg Intravenous Q6H PRN Versie Gores, PA-C   4 mg at 02/19/24 1956   oxyCODONE  (Oxy IR/ROXICODONE ) immediate release tablet 2.5-5 mg  2.5-5 mg Oral Q6H PRN Versie Gores, PA-C   5 mg at 02/20/24 2100   pantoprazole  (PROTONIX ) EC tablet 40 mg  40 mg Oral Daily Versie Gores, PA-C   40 mg at 02/21/24 1324   senna-docusate (Senokot-S) tablet 1 tablet  1 tablet Oral QHS PRN Versie Gores, PA-C         Discharge Medications: Please see discharge summary for a list of discharge medications.  Relevant Imaging Results:  Relevant Lab Results:   Additional Information SSN: [id number]  Elspeth Hals, LCSW

## 2024-02-22 DIAGNOSIS — W19XXXA Unspecified fall, initial encounter: Secondary | ICD-10-CM | POA: Diagnosis not present

## 2024-02-22 DIAGNOSIS — S72002A Fracture of unspecified part of neck of left femur, initial encounter for closed fracture: Secondary | ICD-10-CM | POA: Diagnosis not present

## 2024-02-22 LAB — CBC
HCT: 23.5 % — ABNORMAL LOW (ref 39.0–52.0)
Hemoglobin: 8 g/dL — ABNORMAL LOW (ref 13.0–17.0)
MCH: 33.3 pg (ref 26.0–34.0)
MCHC: 34 g/dL (ref 30.0–36.0)
MCV: 97.9 fL (ref 80.0–100.0)
Platelets: 138 10*3/uL — ABNORMAL LOW (ref 150–400)
RBC: 2.4 MIL/uL — ABNORMAL LOW (ref 4.22–5.81)
RDW: 14.9 % (ref 11.5–15.5)
WBC: 7.1 10*3/uL (ref 4.0–10.5)
nRBC: 0 % (ref 0.0–0.2)

## 2024-02-22 LAB — BASIC METABOLIC PANEL WITH GFR
Anion gap: 7 (ref 5–15)
BUN: 40 mg/dL — ABNORMAL HIGH (ref 8–23)
CO2: 26 mmol/L (ref 22–32)
Calcium: 8.3 mg/dL — ABNORMAL LOW (ref 8.9–10.3)
Chloride: 101 mmol/L (ref 98–111)
Creatinine, Ser: 1.1 mg/dL (ref 0.61–1.24)
GFR, Estimated: >60 mL/min (ref >60)
Glucose, Bld: 141 mg/dL — ABNORMAL HIGH (ref 70–99)
Potassium: 4 mmol/L (ref 3.5–5.1)
Sodium: 134 mmol/L — ABNORMAL LOW (ref 135–145)

## 2024-02-22 LAB — BODY FLUID CULTURE W GRAM STAIN: Gram Stain: NONE SEEN

## 2024-02-22 MED ORDER — POLYETHYLENE GLYCOL 3350 17 G PO PACK
17.0000 g | PACK | Freq: Every day | ORAL | Status: DC
  Administered 2024-02-22: 17 g via ORAL
  Filled 2024-02-22 (×2): qty 1

## 2024-02-22 NOTE — Plan of Care (Signed)
  Problem: Clinical Measurements: Goal: Will remain free from infection Outcome: Not Progressing   Problem: Pain Managment: Goal: General experience of comfort will improve and/or be controlled Outcome: Not Progressing   Problem: Safety: Goal: Ability to remain free from injury will improve Outcome: Not Progressing

## 2024-02-22 NOTE — Plan of Care (Signed)

## 2024-02-22 NOTE — TOC Progression Note (Addendum)
 Transition of Care [facility]) - Progression Note    Patient Details  Name: Matthew Liu MRN: [id number] Date of Birth: 1941-04-14  [facility] CM/SW Contact  Elspeth Hals, LCSW Phone Number: 02/22/2024, 10:50 AM  Clinical Narrative:   PASSR remains pending.  CSW spoke with  [facility] and was told they are behind but will send message to RN reviewer.   1315: PASSR remains pending    Expected Discharge Plan: Skilled Nursing Facility Barriers to Discharge: Continued Medical Work up, SNF Pending bed offer  Expected Discharge Plan and Services In-house Referral: Clinical Social Work   Post Acute Care Choice: Skilled Nursing Facility Living arrangements for the past 2 months: Single Family Home                                       Social Determinants of Health (SDOH) Interventions SDOH Screenings   Food Insecurity: No Food Insecurity (02/18/2024)  Housing: Low Risk  (02/18/2024)  Transportation Needs: No Transportation Needs (02/18/2024)  Utilities: Not At Risk (02/18/2024)  Social Connections: Moderately Integrated (02/18/2024)  Tobacco Use: Low Risk  (02/19/2024)    Readmission Risk Interventions     No data to display

## 2024-02-22 NOTE — Progress Notes (Signed)
 PROGRESS NOTE    DEDERICK Liu  WJX:[id number] DOB: December 05, 1940 DOA: 02/18/2024 PCP: Matthew Hopper, MD   Brief Narrative:  83 y.o. male with medical history significant of CAD s/p CABG x5, anemia of chronic disease, CKD 3A, dysplipidemia, HTN, hx of CVA, idiopathic pulmonary fibrosis, parkinson's disease, OSA, PAD, GERD presenting to the ED with left hip pain after sustaining a fall.    Patient reports that he was awakened by his disabled wife earlier this morning around 1AM as she was hungry. Patient brought wife to kitchen and wife subsequently ate. Once they were done, patient helped wife stand up off of the chair. However, at this time, wife suddenly lost her balance and fell backwards onto patient causing both of them to fall to the ground. Patient immediately began experiencing left hip pain after the fall.  He was able to call his daughter who subsequently called EMS.  Patient placed in leg brace and brought to the ED for further evaluation.  Patient denies any lightheadedness, dizziness, chest pain, palpitations, blurry vision prior to the fall.   ED course: Vital signs stable (variable HR due to 1st degree AV block with supraventricular tachycardia).  CBC with anemia around his baseline, otherwise unremarkable.  BMP with glucose 164, kidney function around his baseline, otherwise unremarkable.  PT/INR normal.  Chest x-ray unremarkable.  Right wrist x-ray unremarkable.  CT head without any acute intracranial findings.  CT C-spine without any acute C-spine injury noted.  Pelvic x-ray showing a displaced proximal left femur fracture, primarily subtrochanteric.  Left hip CT showing a comminuted and displaced intertrochanteric and subtrochanteric left femur fracture without any involvement of the proximal femoral neck or femoral head.  There is presence of mild periarticular soft tissue swelling without significant focal hematoma.  Orthopedics consulted by ED provider.  [facility] hospitalist asked to  evaluate patient for admission.  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 He is  WBAT. Dtr wants pt to go to [facility] at discharge if possible. Ortho wants plavix  for DVT prophylaxis ABLA -s/p 1 UNIT PRBC 5/13 await snf  IPF (idiopathic pulmonary fibrosis) (HCC) On RA stable.  Mild neurocognitive disorder due to Parkinson's disease Parkinson's disease (HCC)  continue sinemet .   Coronary artery disease involving native coronary artery of native heart with unstable angina pectoris (HCC)  stable. On coreg , lipitor . OSA (obstructive sleep apnea) CPAP prn.  Gastroesophageal reflux disease continue protonix  qday. Essential hypertension continue coreg . Hold ARB and lasix  for now.  Estimated body mass index is 21.13 kg/m as calculated from the following:   Height as of this encounter: 5\' 7"  (1.702 m).   Weight as of this encounter: 61.2 kg.  DVT prophylaxis:plavix  Code Status:full Family Communication:none Disposition Plan:  Status is: Inpatient Remains inpatient appropriate because: await snf   Consultants:  ortho  Procedures: hip orif Antimicrobials:none  Subjective: Sitting up eating breakfast No new c/o  Objective: Vitals:   02/21/24 2006 02/22/24 0400 02/22/24  0723 02/22/24 1437  BP: 108/71 (!) 129/50 133/65 (!) 117/51  Pulse: 84 70  70  Resp:  18 16 17   Temp: 98.3 F (36.8 C) 98.2 F (36.8 C) 97.9 F  (36.6 C) (!) 97.4 F (36.3 C)  TempSrc: Oral Oral    SpO2: 100% 96% 98% 98%  Weight:      Height:        Intake/Output Summary (Last 24 hours) at 02/22/2024 1456 Last data filed at 02/22/2024 0900 Gross per 24 hour  Intake 510 ml  Output 1600 ml  Net -1090 ml   Filed Weights   02/18/24 0548 02/19/24 0956  Weight: 61.2 kg 61.2 kg    Examination:  General exam: Appears in nad  Respiratory system: Clear to auscultation. Respiratory effort normal. Cardiovascular system: S1 & S2 heard, RRR. No JVD, murmurs, rubs, gallops or clicks. No pedal edema. Gastrointestinal system: Abdomen is nondistended, soft and nontender. No organomegaly or masses felt. Normal bowel sounds heard. Central nervous system: Alert and oriented. No focal neurological deficits. Extremities: hip incision cdi   Data Reviewed: I have personally reviewed following labs and imaging studies  CBC: Recent Labs  Lab 02/18/24 0549 02/19/24 0456 02/20/24 0700 02/20/24 0812 02/21/24 0650 02/22/24 0728  WBC 10.4 6.7 5.8  --  6.5 7.1  NEUTROABS 8.3*  --   --   --   --   --   HGB 11.2* 9.8* 6.3* 6.9* 7.7* 8.0*  HCT 32.0* 27.8* 18.3* 20.2* 21.6* 23.5*  MCV 99.1 98.6 98.4  --  94.7 97.9  PLT 153 124* 93*  --  107* 138*   Basic Metabolic Panel: Recent Labs  Lab 02/18/24 0549 02/19/24 0456 02/20/24 0700 02/21/24 0650 02/22/24 0728  NA 135 134* 136 134* 134*  K 3.5 4.0 3.8 3.8 4.0  CL 101 100 103 102 101  CO2 22 25 24 25 26   GLUCOSE 164* 151* 126* 123* 141*  BUN 41* 42* 37* 34* 40*  CREATININE 1.22 1.33* 1.20 1.03 1.10  CALCIUM  8.8* 8.6* 7.9* 8.1* 8.3*   GFR: Estimated Creatinine Clearance: 43.3 mL/min (by C-G formula based on SCr of 1.1 mg/dL). Liver Function Tests: No results for input(s): "AST", "ALT", "ALKPHOS", "BILITOT", "PROT", "ALBUMIN " in the last 168 hours. No results for input(s): "LIPASE", "AMYLASE" in the last 168 hours. No results for input(s): "AMMONIA" in the last 168  hours. Coagulation Profile: Recent Labs  Lab 02/18/24 0549  INR 1.1   Cardiac Enzymes: No results for input(s): "CKTOTAL", "CKMB", "CKMBINDEX", "TROPONINI" in the last 168 hours. BNP (last 3 results) No results for input(s): "PROBNP" in the last 8760 hours. HbA1C: No results for input(s): "HGBA1C" in the last 72 hours. CBG: No results for input(s): "GLUCAP" in the last 168 hours. Lipid Profile: No results for input(s): "CHOL", "HDL", "LDLCALC", "TRIG", "CHOLHDL", "LDLDIRECT" in the last 72 hours. Thyroid  Function Tests: No results for input(s): "TSH", "T4TOTAL", "FREET4", "T3FREE", "THYROIDAB" in the last 72 hours. Anemia Panel: No results for input(s): "VITAMINB12", "FOLATE", "FERRITIN", "TIBC", "IRON", "RETICCTPCT" in the last 72 hours.  Sepsis Labs: No results for input(s): "PROCALCITON", "LATICACIDVEN" in the last 168 hours.  Recent Results (from the past 240 hours)  Body fluid culture w Gram Stain     Status: None   Collection Time: 02/19/24 12:48 PM   Specimen: PATH Cytology Misc. fluid; Body Fluid  Result Value Ref Range Status   Specimen Description KNEE LEFT  Final   Special Requests NONE  Final   Gram  Stain NO WBC SEEN NO ORGANISMS SEEN   Final   Culture   Final    NO GROWTH 3 DAYS Performed at [facility], [address] [place], [place] [address]    Report Status 02/22/2024 FINAL  Final  Anaerobic culture w Gram Stain     Status: None (Preliminary result)   Collection Time: 02/19/24 12:48 PM   Specimen: KNEE  Result Value Ref Range Status   Specimen Description KNEE LEFT  Final   Special Requests NONE  Final   Gram Stain   Final    NO WBC SEEN NO ORGANISMS SEEN Performed at [facility], [address], [place], [place] [address]    Culture   Final    NO ANAEROBES ISOLATED; CULTURE IN PROGRESS FOR 5 DAYS   Report Status PENDING  Incomplete         Radiology Studies: No results found.   Scheduled Meds:  acetaminophen   1,000 mg  Oral Q8H   atorvastatin   80 mg Oral QPM   carbidopa -levodopa   1 tablet Oral TID   carvedilol   3.125 mg Oral BID WC   clopidogrel   75 mg Oral Daily   docusate sodium   100 mg Oral BID   gabapentin   300 mg Oral Daily   pantoprazole   40 mg Oral Daily   pneumococcal 20-valent conjugate vaccine  0.5 mL Intramuscular Tomorrow-1000   polyethylene glycol  17 g Oral Daily   Continuous Infusions:  sodium chloride  Stopped (02/21/24 0943)     LOS: 4 days    Time spent: 39 min  Barbee Lew, MD 02/22/2024, 2:56 PM

## 2024-02-23 DIAGNOSIS — M810 Age-related osteoporosis without current pathological fracture: Secondary | ICD-10-CM | POA: Diagnosis not present

## 2024-02-23 DIAGNOSIS — S7222XD Displaced subtrochanteric fracture of left femur, subsequent encounter for closed fracture with routine healing: Secondary | ICD-10-CM | POA: Diagnosis not present

## 2024-02-23 DIAGNOSIS — S72002D Fracture of unspecified part of neck of left femur, subsequent encounter for closed fracture with routine healing: Secondary | ICD-10-CM | POA: Diagnosis not present

## 2024-02-23 DIAGNOSIS — G8918 Other acute postprocedural pain: Secondary | ICD-10-CM | POA: Diagnosis not present

## 2024-02-23 DIAGNOSIS — R262 Difficulty in walking, not elsewhere classified: Secondary | ICD-10-CM | POA: Diagnosis not present

## 2024-02-23 DIAGNOSIS — S72002A Fracture of unspecified part of neck of left femur, initial encounter for closed fracture: Secondary | ICD-10-CM | POA: Diagnosis not present

## 2024-02-23 MED ORDER — POLYETHYLENE GLYCOL 3350 17 G PO PACK: 17.0000 g | PACK | Freq: Every day | ORAL | 0 refills | Status: AC

## 2024-02-23 MED ORDER — SENNOSIDES-DOCUSATE SODIUM 8.6-50 MG PO TABS: 1.0000 | ORAL_TABLET | Freq: Every evening | ORAL | Status: DC | PRN

## 2024-02-23 MED ORDER — ACETAMINOPHEN 500 MG PO TABS: 1000.0000 mg | ORAL_TABLET | Freq: Three times a day (TID) | ORAL | 0 refills | Status: DC

## 2024-02-23 NOTE — Plan of Care (Signed)

## 2024-02-23 NOTE — Discharge Summary (Signed)
 Physician Discharge Summary  Matthew Liu WJX:[id number] DOB: 1941/07/20 DOA: 02/18/2024  PCP: Adrian Hopper, MD  Admit date: 02/18/2024 Discharge date: 02/23/2024  Admitted From: Home Disposition: Skilled nursing facility Recommendations for Outpatient Follow-up:  Follow up with PCP in 1-2 weeks Please obtain BMP/CBC in one week Please follow up with Ortho Dr. Curtiss Dowdy in 2 weeks Please note that I stopped Lasix  at the time of discharge due to his soft blood pressure, monitor him for orthostasis Primary care physician can restart Lasix  if needed in the future.  Home Health: None Equipment/Devices none Discharge Condition: Stable  CODE STATUS: Full code Diet recommendation: Cardiac Brief/Interim Summary:  83 y.o. male with medical history significant of CAD s/p CABG x5, anemia of chronic disease, CKD 3A, dysplipidemia, HTN, hx of CVA, idiopathic pulmonary fibrosis, parkinson's disease, OSA, PAD, GERD presenting to the ED with left hip pain after sustaining a fall.    Patient reports that he was awakened by his disabled wife earlier this morning around 1AM as she was hungry. Patient brought wife to kitchen and wife subsequently ate. Once they were done, patient helped wife stand up off of the chair. However, at this time, wife suddenly lost her balance and fell backwards onto patient causing both of them to fall to the ground. Patient immediately began experiencing left hip pain after the fall.  He was able to call his daughter who subsequently called EMS.  Patient placed in leg brace and brought to the ED for further evaluation.  Patient denies any lightheadedness, dizziness, chest pain, palpitations, blurry vision prior to the fall.   ED course: Vital signs stable (variable HR due to 1st degree AV block with supraventricular tachycardia).  CBC with anemia around his baseline, otherwise unremarkable.  BMP with glucose 164, kidney function around his baseline, otherwise unremarkable.  PT/INR  normal.  Chest x-ray unremarkable.  Right wrist x-ray unremarkable.  CT head without any acute intracranial findings.  CT C-spine without any acute C-spine injury noted.  Pelvic x-ray showing a displaced proximal left femur fracture, primarily subtrochanteric.  Left hip CT showing a comminuted and displaced intertrochanteric and subtrochanteric left femur fracture without any involvement of the proximal femoral neck or femoral head.  There is presence of mild periarticular soft tissue swelling without significant focal hematoma.  Orthopedics consulted by ED provider.  [facility] hospital[facility] asked to evaluate patient for admission.  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  He is weightbearing as tolerated.  He will need to follow-up with Dr. Curtiss Dowdy 2 weeks after discharge.  Continue Plavix  for DVT prophylaxis. He is a very pleasant gentleman and you will enjoy taking care of him.  Acute blood loss anemia status post surgery patient received 1 unit of packed RBC on the 13th.  Hemoglobin has remained stable since then.  IPF (idiopathic pulmonary fibrosis) (HCC) On RA stable. Mild neurocognitive disorder due to Parkinson's disease Parkinson's disease (HCC)  continue sinemet .  Coronary  artery disease involving native coronary artery of native heart with unstable angina pectoris (HCC)  stable. On coreg , lipitor .  I have stopped  his Lasix  on discharge as his blood pressure has been soft.  Please monitor him for fluid overload.  Check orthostatic if he complains of dizziness.  OSA (obstructive sleep apnea) CPAP prn.   Gastroesophageal reflux disease continue protonix  qday.  Essential hypertension I am also holding his losartan  in addition to furosemide .    Estimated body mass index is 21.13 kg/m as calculated from the following:   Height as of this encounter: 5\' 7"  (1.702 m).   Weight as of this encounter: 61.2 kg.  Discharge Instructions  Discharge Instructions     Diet - low sodium heart healthy   Complete by: As directed    Increase activity slowly   Complete by: As directed       Allergies as of 02/23/2024       Reactions   Aspirin  Anaphylaxis, Shortness Of Breath   Quinine Derivatives Hives, Other (See Comments)   "looks like he has the measles"        Medication List     STOP taking these medications    ALPRAZolam  0.25 MG tablet Commonly known as: XANAX    furosemide  40 MG tablet Commonly known as: LASIX    losartan  25 MG tablet Commonly known as: COZAAR    ondansetron  4 MG tablet Commonly known as: ZOFRAN    Pirfenidone  267 MG Tabs Commonly known as: Esbriet        TAKE these medications    acetaminophen  500 MG tablet Commonly known as: TYLENOL  Take 2 tablets (1,000 mg total) by mouth every 8 (eight) hours. What changed:  how much to take when to take this reasons to take this   atorvastatin  80 MG tablet Commonly known as: LIPITOR  Take 1 tablet (80 mg total) by mouth every evening.   carbidopa -levodopa  25-100 MG tablet Commonly known as: SINEMET  IR Take 1 tablet by mouth 3 (three) times daily.   carvedilol  3.125 MG tablet Commonly known as: Coreg  Take 1 tablet (3.125 mg total) by mouth 2 (two) times daily with a meal.   clopidogrel  75 MG tablet Commonly known as: PLAVIX  TAKE 1 TABLET BY MOUTH EVERY DAY   cyanocobalamin  100 MCG tablet Commonly known as:  VITAMIN B12 Take 100 mcg by mouth daily.   docusate sodium  100 MG capsule Commonly known as: COLACE Take 200 mg by mouth 2 (two) times daily.   meloxicam 15 MG tablet Commonly known as: MOBIC Take 15 mg by mouth daily as needed for pain.   One-A-Day Mens 50+ Tabs Take 1 tablet by mouth daily with breakfast.   oxyCODONE  5 MG immediate release tablet Commonly known as: Oxy IR/ROXICODONE  Take 0.5-1 tablets (2.5-5 mg total) by mouth every 6 (six) hours as needed for moderate pain (pain score 4-6) or severe pain (pain score 7-10) (2.5 mg for pain score 4-6, 5 mg pain score 7-10).   pantoprazole  40 MG tablet Commonly known as: PROTONIX  Take 1 tablet (40 mg total) by mouth daily.   polyethylene glycol 17 g packet Commonly known as: MIRALAX  / GLYCOLAX  Take 17 g by mouth daily. Start taking on: Feb 24, 2024   senna-docusate 8.6-50 MG tablet Commonly known as: Senokot-S Take 1 tablet by mouth at bedtime as needed for mild constipation.   tiZANidine 2 MG tablet Commonly known as: ZANAFLEX Take 2 mg by mouth every 8 (eight) hours as needed for muscle spasms.   Vitamin D3  50 MCG (2000 UT) capsule Take 2,000 Units by mouth daily.        Follow-up Information     Adrian Hopper, MD Follow up.   Specialty: Internal Medicine Contact information: [address] [place] [address] [phone]         Laneta Pintos, MD Follow up.   Specialty: Orthopedic Surgery Contact information: [address] [place] [address] [phone]                Allergies  Allergen Reactions   Aspirin  Anaphylaxis and Shortness Of Breath   Quinine Derivatives Hives and Other (See Comments)    "looks like he has the measles"    Consultations:ortho   Procedures/Studies: DG FEMUR PORT MIN 2 VIEWS LEFT Result Date: 02/19/2024 CLINICAL DATA:  PACU.  Closed left subtrochanteric femoral fracture. EXAM: LEFT FEMUR PORTABLE 2 VIEWS COMPARISON:  AP pelvis  and CT left hip 02/18/2024; left knee radiographs 02/18/2024 FINDINGS: Interval left femoral cephalomedullary nail fixation of the previously seen intertrochanteric and subtrochanteric fracture. Improved alignment. No perihardware lucency is seen to indicate hardware failure or loosening. Mild left femoroacetabular osteoarthritis. Mild-to-moderate medial compartment of the knee joint space narrowing. Expected postoperative changes including mild lateral proximal thigh subcutaneous air. Moderate to high-grade atherosclerotic calcifications. IMPRESSION: Interval left femoral cephalomedullary nail fixation of the previously seen intertrochanteric and subtrochanteric fracture. Improved alignment. Electronically Signed   By: Bertina Broccoli M.D.   On: 02/19/2024 17:02   DG FEMUR MIN 2 VIEWS LEFT Result Date: 02/19/2024 CLINICAL DATA:  Elective surgery. EXAM: LEFT FEMUR 2 VIEWS COMPARISON:  AP pelvis 02/18/2024 FINDINGS: Images were performed intraoperatively without the presence of a radiologist. Redemonstration of comminuted proximal left femoral prominently subtrochanteric fracture. The patient is undergoing long cephalomedullary nail fixation. No hardware complication is seen. Total fluoroscopy images: 6 Total fluoroscopy time: 119 seconds Total dose: Radiation Exposure Index (as provided by the fluoroscopic device): 13.4 mGy air Kerma Please see intraoperative findings for further detail. IMPRESSION: Intraoperative fluoroscopy for proximal left femoral fracture fixation. Electronically Signed   By: Bertina Broccoli M.D.   On: 02/19/2024 16:59   DG C-Arm 1-60 Min-No Report Result Date: 02/19/2024 Fluoroscopy was utilized by the requesting physician.  No radiographic interpretation.   DG Knee Left Port Result Date: 02/18/2024 CLINICAL DATA:  Left femur fracture. EXAM: PORTABLE LEFT KNEE - 1-2 VIEW COMPARISON:  None Available. FINDINGS: No fracture or dislocation. Joint spaces are preserved allowing for difficulties  with positioning. No focal bone abnormality or significant degenerative change. Minimal joint effusion. IMPRESSION: Minimal joint effusion. No fracture or dislocation. Electronically Signed   By: Chadwick Colonel M.D.   On: 02/18/2024 20:17   CT Hip Left Wo Contrast Result Date: 02/18/2024 CLINICAL DATA:  Status post fall. Closed left subtrochanteric hip fracture. EXAM: CT OF THE LEFT HIP WITHOUT CONTRAST TECHNIQUE: Multidetector CT imaging of the left hip was performed according to the standard protocol. Multiplanar CT image reconstructions were also generated. RADIATION DOSE REDUCTION: This exam was performed according to the departmental dose-optimization program which includes automated exposure control, adjustment of the mA and/or kV according to patient size and/or use of iterative reconstruction technique. COMPARISON:  Pelvic radiographs same date.  Pelvic CTA 09/04/2022. FINDINGS: Bones/Joint/Cartilage The bones appear adequately mineralized. There is a comminuted and displaced intertrochanteric and subtrochanteric left femur fracture. A medial butterfly fragment involving the lesser trochanter measures up to 4.8 cm in length. The distal fragment demonstrates up to 5.2 cm  of proximal displacement and apex lateral angulation. There is no involvement of the proximal femoral neck. The femoral head is located and intact. No evidence of left pelvic fracture. There are degenerative changes within the lower lumbar spine and left sacroiliac joint. Ligaments Suboptimally assessed by CT. Muscles and Tendons Mild generalized muscular atrophy. No significant focal hematoma identified. Soft tissues Mild soft tissue swelling around the fracture, greatest anteriorly. No evidence of foreign body or soft tissue emphysema. Aortic, iliac and femoral atherosclerosis noted. There are diverticular changes within the sigmoid colon. No evidence of intrapelvic hematoma. IMPRESSION: 1. Comminuted and displaced intertrochanteric  and subtrochanteric left femur fracture as described. No involvement of the proximal femoral neck or femoral head. 2. No evidence of left pelvic fracture. 3. Mild periarticular soft tissue swelling without significant focal hematoma. 4.  Aortic Atherosclerosis (ICD10-I70.0). Electronically Signed   By: Elmon Hagedorn M.D.   On: 02/18/2024 10:40   DG Chest Portable 1 View Result Date: 02/18/2024 CLINICAL DATA:  Femur fracture EXAM: PORTABLE CHEST 1 VIEW COMPARISON:  11/02/2022 FINDINGS: Chronic cardiomegaly. Prior CABG. There is no edema, consolidation, effusion, or pneumothorax. No acute osseous finding. IMPRESSION: No acute finding Electronically Signed   By: Ronnette Coke M.D.   On: 02/18/2024 07:04   DG Wrist Complete Right Result Date: 02/18/2024 CLINICAL DATA:  Fall EXAM: RIGHT WRIST - COMPLETE 3 VIEW COMPARISON:  None Available. FINDINGS: There is no evidence of fracture or dislocation. Osteopenia and arterial calcification. IMPRESSION: No acute finding. Electronically Signed   By: Ronnette Coke M.D.   On: 02/18/2024 07:03   DG Pelvis Portable Result Date: 02/18/2024 CLINICAL DATA:  Fall.  Left hip pain EXAM: PORTABLE PELVIS 1 VIEWS COMPARISON:  None Available. FINDINGS: Acute left femur fracture with varus angulation and proximal retraction of the lesser trochanter. The main fracture plane is subtrochanteric with some posterior extension to the greater trochanter region. No evidence of pelvic ring fracture or diastasis. Generalized osteopenia. IMPRESSION: Displaced proximal femur fracture, primarily subtrochanteric. Electronically Signed   By: Ronnette Coke M.D.   On: 02/18/2024 07:03   CT Head Wo Contrast Result Date: 02/18/2024 CLINICAL DATA:  Level 2 trauma EXAM: CT HEAD WITHOUT CONTRAST CT CERVICAL SPINE WITHOUT CONTRAST TECHNIQUE: Multidetector CT imaging of the head and cervical spine was performed following the standard protocol without intravenous contrast. Multiplanar CT image  reconstructions of the cervical spine were also generated. RADIATION DOSE REDUCTION: This exam was performed according to the departmental dose-optimization program which includes automated exposure control, adjustment of the mA and/or kV according to patient size and/or use of iterative reconstruction technique. COMPARISON:  09/14/2015 head CT FINDINGS: CT HEAD FINDINGS Brain: No evidence of acute infarction, hemorrhage, hydrocephalus, extra-axial collection or mass lesion/mass effect. Generalized brain atrophy. Chronic small vessel ischemia in the cerebral white matter. Vascular: No hyperdense vessel or unexpected calcification. Skull: Normal. Negative for fracture or focal lesion. Sinuses/Orbits: No acute finding. CT CERVICAL SPINE FINDINGS Alignment: No traumatic malalignment Skull base and vertebrae: Generalized osteopenia. Remote superior endplate fracture of C7. Soft tissues and spinal canal: No prevertebral fluid or swelling. No visible canal hematoma. Disc levels: Generalized degenerative changes specially affecting mid cervical disc spaces with C3-4 ankylosis. Upper chest: Negative IMPRESSION: No evidence of acute intracranial or cervical spine injury. Electronically Signed   By: Ronnette Coke M.D.   On: 02/18/2024 06:45   CT Cervical Spine Wo Contrast Result Date: 02/18/2024 CLINICAL DATA:  Level 2 trauma EXAM: CT HEAD WITHOUT CONTRAST CT CERVICAL SPINE  WITHOUT CONTRAST TECHNIQUE: Multidetector CT imaging of the head and cervical spine was performed following the standard protocol without intravenous contrast. Multiplanar CT image reconstructions of the cervical spine were also generated. RADIATION DOSE REDUCTION: This exam was performed according to the departmental dose-optimization program which includes automated exposure control, adjustment of the mA and/or kV according to patient size and/or use of iterative reconstruction technique. COMPARISON:  09/14/2015 head CT FINDINGS: CT HEAD FINDINGS  Brain: No evidence of acute infarction, hemorrhage, hydrocephalus, extra-axial collection or mass lesion/mass effect. Generalized brain atrophy. Chronic small vessel ischemia in the cerebral white matter. Vascular: No hyperdense vessel or unexpected calcification. Skull: Normal. Negative for fracture or focal lesion. Sinuses/Orbits: No acute finding. CT CERVICAL SPINE FINDINGS Alignment: No traumatic malalignment Skull base and vertebrae: Generalized osteopenia. Remote superior endplate fracture of C7. Soft tissues and spinal canal: No prevertebral fluid or swelling. No visible canal hematoma. Disc levels: Generalized degenerative changes specially affecting mid cervical disc spaces with C3-4 ankylosis. Upper chest: Negative IMPRESSION: No evidence of acute intracranial or cervical spine injury. Electronically Signed   By: Ronnette Coke M.D.   On: 02/18/2024 06:45   (Echo, Carotid, EGD, Colonoscopy, ERCP)    Subjective: Sitting up by the side of the bed after breakfast denies any new complaints feeling better pain is improved  Discharge Exam: Vitals:   02/23/24 0424 02/23/24 0750  BP: 122/71 129/65  Pulse: 85 94  Resp: 16 16  Temp: 98.2 F (36.8 C) 98.2 F (36.8 C)  SpO2: 97% 100%   Vitals:   02/22/24 1437 02/22/24 1943 02/23/24 0424 02/23/24 0750  BP: (!) 117/51 (!) 120/58 122/71 129/65  Pulse: 70 63 85 94  Resp: 17 17 16 16   Temp: (!) 97.4 F (36.3 C) 98.4 F (36.9 C) 98.2 F (36.8 C) 98.2 F (36.8 C)  TempSrc:   Oral Oral  SpO2: 98% 97% 97% 100%  Weight:      Height:        General: Pt is alert, awake, not in acute distress Cardiovascular: RRR, S1/S2 +, no rubs, no gallops Respiratory: CTA bilaterally, no wheezing, no rhonchi Abdominal: Soft, NT, ND, bowel sounds + Extremities: no edema, no cyanosis Incision clean dry intact    The results of significant diagnostics from this hospitalization (including imaging, microbiology, ancillary and laboratory) are listed below  for reference.     Microbiology: Recent Results (from the past 240 hours)  Body fluid culture w Gram Stain     Status: None   Collection Time: 02/19/24 12:48 PM   Specimen: PATH Cytology Misc. fluid; Body Fluid  Result Value Ref Range Status   Specimen Description KNEE LEFT  Final   Special Requests NONE  Final   Gram Stain NO WBC SEEN NO ORGANISMS SEEN   Final   Culture   Final    NO GROWTH 3 DAYS Performed at [facility], [address], [place], [place] [address]    Report Status 02/22/2024 FINAL  Final  Anaerobic culture w Gram Stain     Status: None (Preliminary result)   Collection Time: 02/19/24 12:48 PM   Specimen: KNEE  Result Value Ref Range Status   Specimen Description KNEE LEFT  Final   Special Requests NONE  Final   Gram Stain   Final    NO WBC SEEN NO ORGANISMS SEEN Performed at [facility], [address] [place], [place] [address]    Culture   Final    NO  ANAEROBES ISOLATED; CULTURE IN PROGRESS FOR 5 DAYS   Report Status PENDING  Incomplete     Labs: BNP (last 3 results) No results for input(s): "BNP" in the last 8760 hours. Basic Metabolic Panel: Recent Labs  Lab 02/18/24 0549 02/19/24 0456 02/20/24 0700 02/21/24 0650 02/22/24 0728  NA 135 134* 136 134* 134*  K 3.5 4.0 3.8 3.8 4.0  CL 101 100 103 102 101  CO2 22 25 24 25 26   GLUCOSE 164* 151* 126* 123* 141*  BUN 41* 42* 37* 34* 40*  CREATININE 1.22 1.33* 1.20 1.03 1.10  CALCIUM  8.8* 8.6* 7.9* 8.1* 8.3*   Liver Function Tests: No results for input(s): "AST", "ALT", "ALKPHOS", "BILITOT", "PROT", "ALBUMIN " in the last 168 hours. No results for input(s): "LIPASE", "AMYLASE" in the last 168 hours. No results for input(s): "AMMONIA" in the last 168 hours. CBC: Recent Labs  Lab 02/18/24 0549 02/19/24 0456 02/20/24 0700 02/20/24 0812 02/21/24 0650 02/22/24 0728  WBC 10.4 6.7 5.8  --  6.5 7.1  NEUTROABS 8.3*  --   --   --   --   --   HGB 11.2* 9.8* 6.3* 6.9* 7.7* 8.0*  HCT  32.0* 27.8* 18.3* 20.2* 21.6* 23.5*  MCV 99.1 98.6 98.4  --  94.7 97.9  PLT 153 124* 93*  --  107* 138*   Cardiac Enzymes: No results for input(s): "CKTOTAL", "CKMB", "CKMBINDEX", "TROPONINI" in the last 168 hours. BNP: Invalid input(s): "POCBNP" CBG: No results for input(s): "GLUCAP" in the last 168 hours. D-Dimer No results for input(s): "DDIMER" in the last 72 hours. Hgb A1c No results for input(s): "HGBA1C" in the last 72 hours. Lipid Profile No results for input(s): "CHOL", "HDL", "LDLCALC", "TRIG", "CHOLHDL", "LDLDIRECT" in the last 72 hours. Thyroid  function studies No results for input(s): "TSH", "T4TOTAL", "T3FREE", "THYROIDAB" in the last 72 hours.  Invalid input(s): "FREET3" Anemia work up No results for input(s): "VITAMINB12", "FOLATE", "FERRITIN", "TIBC", "IRON", "RETICCTPCT" in the last 72 hours. Urinalysis    Component Value Date/Time   COLORURINE YELLOW 03/25/2022 0039   APPEARANCEUR CLEAR 03/25/2022 0039   LABSPEC 1.012 03/25/2022 0039   PHURINE 5.0 03/25/2022 0039   GLUCOSEU NEGATIVE 03/25/2022 0039   HGBUR SMALL (A) 03/25/2022 0039   BILIRUBINUR NEGATIVE 03/25/2022 0039   KETONESUR NEGATIVE 03/25/2022 0039   PROTEINUR NEGATIVE 03/25/2022 0039   NITRITE NEGATIVE 03/25/2022 0039   LEUKOCYTESUR NEGATIVE 03/25/2022 0039   Sepsis Labs Recent Labs  Lab 02/19/24 0456 02/20/24 0700 02/21/24 0650 02/22/24 0728  WBC 6.7 5.8 6.5 7.1   Microbiology Recent Results (from the past 240 hours)  Body fluid culture w Gram Stain     Status: None   Collection Time: 02/19/24 12:48 PM   Specimen: PATH Cytology Misc. fluid; Body Fluid  Result Value Ref Range Status   Specimen Description KNEE LEFT  Final   Special Requests NONE  Final   Gram Stain NO WBC SEEN NO ORGANISMS SEEN   Final   Culture   Final    NO GROWTH 3 DAYS Performed at [facility], [address], [place], [place] [address]    Report Status 02/22/2024 FINAL  Final  Anaerobic culture w  Gram Stain     Status: None (Preliminary result)   Collection Time: 02/19/24 12:48 PM   Specimen: KNEE  Result Value Ref Range Status   Specimen Description KNEE LEFT  Final   Special Requests NONE  Final   Gram Stain   Final  NO WBC SEEN NO ORGANISMS SEEN Performed at [facility], [address], [place], [place] [address]    Culture   Final    NO ANAEROBES ISOLATED; CULTURE IN PROGRESS FOR 5 DAYS   Report Status PENDING  Incomplete     Time coordinating discharge: 38 min SIGNED:  Barbee Lew, MD  [facility] 02/23/2024, 1:28 PM

## 2024-02-23 NOTE — Plan of Care (Signed)

## 2024-02-23 NOTE — Progress Notes (Addendum)
 Report called and given to [facility] in [place]

## 2024-02-23 NOTE — TOC Transition Note (Signed)
 Transition of Care [facility]) - Discharge Note   Patient Details  Name: Matthew Liu MRN: [id number] Date of Birth: [birth year]  Transition of Care [facility]) CM/SW Contact:  Elspeth Hals, LCSW Phone Number: 02/23/2024, 2:15 PM   Clinical Narrative:   Pt discharging to [facility].  RN call report to [phone].  Daughter Edwina Gram will transport and will need pt brought down to main north tower entrance with assistance into the vehicle.    Final next level of care: Skilled Nursing Facility Barriers to Discharge: Barriers Resolved   Patient Goals and CMS Choice Patient states their goals for this hospitalization and ongoing recovery are:: pt unable to state goal   Choice offered to / list presented to : Adult Children (daughter Edwina Gram)      Discharge Placement              Patient chooses bed at: [facility],  Patient to be transferred to facility by: daughter Edwina Gram Name of family member notified: daughter Edwina Gram Patient and family notified of of transfer: 02/23/24  Discharge Plan and Services Additional resources added to the After Visit Summary for   In-house Referral: Clinical Social Work   Post Acute Care Choice: Skilled Nursing Facility                               Social Drivers of Health (SDOH) Interventions SDOH Screenings   Food Insecurity: No Food Insecurity (02/18/2024)  Housing: Low Risk  (02/18/2024)  Transportation Needs: No Transportation Needs (02/18/2024)  Utilities: Not At Risk (02/18/2024)  Social Connections: Moderately Integrated (02/18/2024)  Tobacco Use: Low Risk  (02/19/2024)     Readmission Risk Interventions     No data to display

## 2024-02-23 NOTE — TOC Progression Note (Addendum)
 Transition of Care [facility]) - Progression Note    Patient Details  Name: Matthew Liu MRN: [id number] Date of Birth: 05/10/1941  Transition of Care [facility]) CM/SW Contact  Elspeth Hals, LCSW Phone Number: 02/23/2024, 8:29 AM  Clinical Narrative:   PASSR remains pending.  CSW called [facility] again and was told again that they are behind, no estimate on when they will get to this passr request.   1315: passr approved: [id number] E. CSW confirmed with Tracy/[facility] they can receive pt today.  MD notified.  CSW spoke with daughter Edwina Gram regarding transportation and she is able to transport.   Expected Discharge Plan: Skilled Nursing Facility Barriers to Discharge: Continued Medical Work up, SNF Pending bed offer  Expected Discharge Plan and Services In-house Referral: Clinical Social Work   Post Acute Care Choice: Skilled Nursing Facility Living arrangements for the past 2 months: Single Family Home                                       Social Determinants of Health (SDOH) Interventions SDOH Screenings   Food Insecurity: No Food Insecurity (02/18/2024)  Housing: Low Risk  (02/18/2024)  Transportation Needs: No Transportation Needs (02/18/2024)  Utilities: Not At Risk (02/18/2024)  Social Connections: Moderately Integrated (02/18/2024)  Tobacco Use: Low Risk  (02/19/2024)    Readmission Risk Interventions     No data to display

## 2024-02-24 LAB — ANAEROBIC CULTURE W GRAM STAIN: Gram Stain: NONE SEEN

## 2024-02-25 DIAGNOSIS — M810 Age-related osteoporosis without current pathological fracture: Secondary | ICD-10-CM | POA: Diagnosis not present

## 2024-02-25 DIAGNOSIS — G8918 Other acute postprocedural pain: Secondary | ICD-10-CM | POA: Diagnosis not present

## 2024-02-25 DIAGNOSIS — R262 Difficulty in walking, not elsewhere classified: Secondary | ICD-10-CM | POA: Diagnosis not present

## 2024-02-25 DIAGNOSIS — S72002D Fracture of unspecified part of neck of left femur, subsequent encounter for closed fracture with routine healing: Secondary | ICD-10-CM | POA: Diagnosis not present

## 2024-03-05 DIAGNOSIS — S7222XD Displaced subtrochanteric fracture of left femur, subsequent encounter for closed fracture with routine healing: Secondary | ICD-10-CM | POA: Diagnosis not present

## 2024-03-11 DIAGNOSIS — F0670 Mild neurocognitive disorder due to known physiological condition without behavioral disturbance: Secondary | ICD-10-CM | POA: Diagnosis not present

## 2024-03-11 DIAGNOSIS — I130 Hypertensive heart and chronic kidney disease with heart failure and stage 1 through stage 4 chronic kidney disease, or unspecified chronic kidney disease: Secondary | ICD-10-CM | POA: Diagnosis not present

## 2024-03-11 DIAGNOSIS — D631 Anemia in chronic kidney disease: Secondary | ICD-10-CM | POA: Diagnosis not present

## 2024-03-11 DIAGNOSIS — E440 Moderate protein-calorie malnutrition: Secondary | ICD-10-CM | POA: Diagnosis not present

## 2024-03-11 DIAGNOSIS — G20A1 Parkinson's disease without dyskinesia, without mention of fluctuations: Secondary | ICD-10-CM | POA: Diagnosis not present

## 2024-03-11 DIAGNOSIS — M80052D Age-related osteoporosis with current pathological fracture, left femur, subsequent encounter for fracture with routine healing: Secondary | ICD-10-CM | POA: Diagnosis not present

## 2024-03-11 DIAGNOSIS — I739 Peripheral vascular disease, unspecified: Secondary | ICD-10-CM | POA: Diagnosis not present

## 2024-03-11 DIAGNOSIS — N1831 Chronic kidney disease, stage 3a: Secondary | ICD-10-CM | POA: Diagnosis not present

## 2024-03-11 DIAGNOSIS — I5022 Chronic systolic (congestive) heart failure: Secondary | ICD-10-CM | POA: Diagnosis not present

## 2024-03-13 DIAGNOSIS — I130 Hypertensive heart and chronic kidney disease with heart failure and stage 1 through stage 4 chronic kidney disease, or unspecified chronic kidney disease: Secondary | ICD-10-CM | POA: Diagnosis not present

## 2024-03-13 DIAGNOSIS — D631 Anemia in chronic kidney disease: Secondary | ICD-10-CM | POA: Diagnosis not present

## 2024-03-13 DIAGNOSIS — F0670 Mild neurocognitive disorder due to known physiological condition without behavioral disturbance: Secondary | ICD-10-CM | POA: Diagnosis not present

## 2024-03-13 DIAGNOSIS — E440 Moderate protein-calorie malnutrition: Secondary | ICD-10-CM | POA: Diagnosis not present

## 2024-03-13 DIAGNOSIS — I739 Peripheral vascular disease, unspecified: Secondary | ICD-10-CM | POA: Diagnosis not present

## 2024-03-13 DIAGNOSIS — N1831 Chronic kidney disease, stage 3a: Secondary | ICD-10-CM | POA: Diagnosis not present

## 2024-03-13 DIAGNOSIS — M80052D Age-related osteoporosis with current pathological fracture, left femur, subsequent encounter for fracture with routine healing: Secondary | ICD-10-CM | POA: Diagnosis not present

## 2024-03-13 DIAGNOSIS — I5022 Chronic systolic (congestive) heart failure: Secondary | ICD-10-CM | POA: Diagnosis not present

## 2024-03-13 DIAGNOSIS — G20A1 Parkinson's disease without dyskinesia, without mention of fluctuations: Secondary | ICD-10-CM | POA: Diagnosis not present

## 2024-03-15 DIAGNOSIS — D62 Acute posthemorrhagic anemia: Secondary | ICD-10-CM | POA: Diagnosis not present

## 2024-03-15 DIAGNOSIS — G20A1 Parkinson's disease without dyskinesia, without mention of fluctuations: Secondary | ICD-10-CM | POA: Diagnosis not present

## 2024-03-15 DIAGNOSIS — R600 Localized edema: Secondary | ICD-10-CM | POA: Diagnosis not present

## 2024-03-15 DIAGNOSIS — I739 Peripheral vascular disease, unspecified: Secondary | ICD-10-CM | POA: Diagnosis not present

## 2024-03-15 DIAGNOSIS — I130 Hypertensive heart and chronic kidney disease with heart failure and stage 1 through stage 4 chronic kidney disease, or unspecified chronic kidney disease: Secondary | ICD-10-CM | POA: Diagnosis not present

## 2024-03-15 DIAGNOSIS — M80052D Age-related osteoporosis with current pathological fracture, left femur, subsequent encounter for fracture with routine healing: Secondary | ICD-10-CM | POA: Diagnosis not present

## 2024-03-15 DIAGNOSIS — D631 Anemia in chronic kidney disease: Secondary | ICD-10-CM | POA: Diagnosis not present

## 2024-03-15 DIAGNOSIS — I10 Essential (primary) hypertension: Secondary | ICD-10-CM | POA: Diagnosis not present

## 2024-03-15 DIAGNOSIS — I5022 Chronic systolic (congestive) heart failure: Secondary | ICD-10-CM | POA: Diagnosis not present

## 2024-03-15 DIAGNOSIS — M25552 Pain in left hip: Secondary | ICD-10-CM | POA: Diagnosis not present

## 2024-03-15 DIAGNOSIS — I2510 Atherosclerotic heart disease of native coronary artery without angina pectoris: Secondary | ICD-10-CM | POA: Diagnosis not present

## 2024-03-15 DIAGNOSIS — E440 Moderate protein-calorie malnutrition: Secondary | ICD-10-CM | POA: Diagnosis not present

## 2024-03-15 DIAGNOSIS — S7222XA Displaced subtrochanteric fracture of left femur, initial encounter for closed fracture: Secondary | ICD-10-CM | POA: Diagnosis not present

## 2024-03-15 DIAGNOSIS — F0670 Mild neurocognitive disorder due to known physiological condition without behavioral disturbance: Secondary | ICD-10-CM | POA: Diagnosis not present

## 2024-03-15 DIAGNOSIS — N1831 Chronic kidney disease, stage 3a: Secondary | ICD-10-CM | POA: Diagnosis not present

## 2024-03-15 DIAGNOSIS — Z9861 Coronary angioplasty status: Secondary | ICD-10-CM | POA: Diagnosis not present

## 2024-03-19 DIAGNOSIS — I739 Peripheral vascular disease, unspecified: Secondary | ICD-10-CM | POA: Diagnosis not present

## 2024-03-19 DIAGNOSIS — D539 Nutritional anemia, unspecified: Secondary | ICD-10-CM | POA: Diagnosis not present

## 2024-03-19 DIAGNOSIS — I5022 Chronic systolic (congestive) heart failure: Secondary | ICD-10-CM | POA: Diagnosis not present

## 2024-03-19 DIAGNOSIS — E440 Moderate protein-calorie malnutrition: Secondary | ICD-10-CM | POA: Diagnosis not present

## 2024-03-19 DIAGNOSIS — D631 Anemia in chronic kidney disease: Secondary | ICD-10-CM | POA: Diagnosis not present

## 2024-03-19 DIAGNOSIS — G20A1 Parkinson's disease without dyskinesia, without mention of fluctuations: Secondary | ICD-10-CM | POA: Diagnosis not present

## 2024-03-19 DIAGNOSIS — I130 Hypertensive heart and chronic kidney disease with heart failure and stage 1 through stage 4 chronic kidney disease, or unspecified chronic kidney disease: Secondary | ICD-10-CM | POA: Diagnosis not present

## 2024-03-19 DIAGNOSIS — F0670 Mild neurocognitive disorder due to known physiological condition without behavioral disturbance: Secondary | ICD-10-CM | POA: Diagnosis not present

## 2024-03-19 DIAGNOSIS — N1831 Chronic kidney disease, stage 3a: Secondary | ICD-10-CM | POA: Diagnosis not present

## 2024-03-19 DIAGNOSIS — M80052D Age-related osteoporosis with current pathological fracture, left femur, subsequent encounter for fracture with routine healing: Secondary | ICD-10-CM | POA: Diagnosis not present

## 2024-03-20 DIAGNOSIS — I130 Hypertensive heart and chronic kidney disease with heart failure and stage 1 through stage 4 chronic kidney disease, or unspecified chronic kidney disease: Secondary | ICD-10-CM | POA: Diagnosis not present

## 2024-03-20 DIAGNOSIS — I739 Peripheral vascular disease, unspecified: Secondary | ICD-10-CM | POA: Diagnosis not present

## 2024-03-20 DIAGNOSIS — G20A1 Parkinson's disease without dyskinesia, without mention of fluctuations: Secondary | ICD-10-CM | POA: Diagnosis not present

## 2024-03-20 DIAGNOSIS — M80052D Age-related osteoporosis with current pathological fracture, left femur, subsequent encounter for fracture with routine healing: Secondary | ICD-10-CM | POA: Diagnosis not present

## 2024-03-20 DIAGNOSIS — E440 Moderate protein-calorie malnutrition: Secondary | ICD-10-CM | POA: Diagnosis not present

## 2024-03-20 DIAGNOSIS — F0670 Mild neurocognitive disorder due to known physiological condition without behavioral disturbance: Secondary | ICD-10-CM | POA: Diagnosis not present

## 2024-03-20 DIAGNOSIS — D631 Anemia in chronic kidney disease: Secondary | ICD-10-CM | POA: Diagnosis not present

## 2024-03-20 DIAGNOSIS — I5022 Chronic systolic (congestive) heart failure: Secondary | ICD-10-CM | POA: Diagnosis not present

## 2024-03-20 DIAGNOSIS — N1831 Chronic kidney disease, stage 3a: Secondary | ICD-10-CM | POA: Diagnosis not present

## 2024-03-21 DIAGNOSIS — I130 Hypertensive heart and chronic kidney disease with heart failure and stage 1 through stage 4 chronic kidney disease, or unspecified chronic kidney disease: Secondary | ICD-10-CM | POA: Diagnosis not present

## 2024-03-21 DIAGNOSIS — E440 Moderate protein-calorie malnutrition: Secondary | ICD-10-CM | POA: Diagnosis not present

## 2024-03-21 DIAGNOSIS — M80052D Age-related osteoporosis with current pathological fracture, left femur, subsequent encounter for fracture with routine healing: Secondary | ICD-10-CM | POA: Diagnosis not present

## 2024-03-21 DIAGNOSIS — F0670 Mild neurocognitive disorder due to known physiological condition without behavioral disturbance: Secondary | ICD-10-CM | POA: Diagnosis not present

## 2024-03-21 DIAGNOSIS — I739 Peripheral vascular disease, unspecified: Secondary | ICD-10-CM | POA: Diagnosis not present

## 2024-03-21 DIAGNOSIS — N1831 Chronic kidney disease, stage 3a: Secondary | ICD-10-CM | POA: Diagnosis not present

## 2024-03-21 DIAGNOSIS — I5022 Chronic systolic (congestive) heart failure: Secondary | ICD-10-CM | POA: Diagnosis not present

## 2024-03-21 DIAGNOSIS — D631 Anemia in chronic kidney disease: Secondary | ICD-10-CM | POA: Diagnosis not present

## 2024-03-21 DIAGNOSIS — G20A1 Parkinson's disease without dyskinesia, without mention of fluctuations: Secondary | ICD-10-CM | POA: Diagnosis not present

## 2024-03-22 DIAGNOSIS — E440 Moderate protein-calorie malnutrition: Secondary | ICD-10-CM | POA: Diagnosis not present

## 2024-03-22 DIAGNOSIS — I739 Peripheral vascular disease, unspecified: Secondary | ICD-10-CM | POA: Diagnosis not present

## 2024-03-22 DIAGNOSIS — I5022 Chronic systolic (congestive) heart failure: Secondary | ICD-10-CM | POA: Diagnosis not present

## 2024-03-22 DIAGNOSIS — G20A1 Parkinson's disease without dyskinesia, without mention of fluctuations: Secondary | ICD-10-CM | POA: Diagnosis not present

## 2024-03-22 DIAGNOSIS — I130 Hypertensive heart and chronic kidney disease with heart failure and stage 1 through stage 4 chronic kidney disease, or unspecified chronic kidney disease: Secondary | ICD-10-CM | POA: Diagnosis not present

## 2024-03-22 DIAGNOSIS — M80052D Age-related osteoporosis with current pathological fracture, left femur, subsequent encounter for fracture with routine healing: Secondary | ICD-10-CM | POA: Diagnosis not present

## 2024-03-22 DIAGNOSIS — N1831 Chronic kidney disease, stage 3a: Secondary | ICD-10-CM | POA: Diagnosis not present

## 2024-03-22 DIAGNOSIS — D631 Anemia in chronic kidney disease: Secondary | ICD-10-CM | POA: Diagnosis not present

## 2024-03-22 DIAGNOSIS — F0670 Mild neurocognitive disorder due to known physiological condition without behavioral disturbance: Secondary | ICD-10-CM | POA: Diagnosis not present

## 2024-03-23 ENCOUNTER — Inpatient Hospital Stay (HOSPITAL_COMMUNITY)
Admission: EM | Admit: 2024-03-23 | Discharge: 2024-03-27 | DRG: 482 | Disposition: A | Discharge status: Skilled Nursing Facility | Source: Other Acute Inpatient Hospital | Attending: Internal Medicine | Admitting: Internal Medicine

## 2024-03-23 ENCOUNTER — Encounter (HOSPITAL_COMMUNITY): Payer: Self-pay | Admitting: Family Medicine

## 2024-03-23 ENCOUNTER — Other Ambulatory Visit: Payer: Self-pay

## 2024-03-23 ENCOUNTER — Emergency Department (HOSPITAL_COMMUNITY)

## 2024-03-23 DIAGNOSIS — M25551 Pain in right hip: Secondary | ICD-10-CM | POA: Diagnosis present

## 2024-03-23 DIAGNOSIS — D539 Nutritional anemia, unspecified: Secondary | ICD-10-CM | POA: Diagnosis present

## 2024-03-23 DIAGNOSIS — K219 Gastro-esophageal reflux disease without esophagitis: Secondary | ICD-10-CM | POA: Diagnosis present

## 2024-03-23 DIAGNOSIS — R54 Age-related physical debility: Secondary | ICD-10-CM | POA: Diagnosis present

## 2024-03-23 DIAGNOSIS — R296 Repeated falls: Secondary | ICD-10-CM | POA: Diagnosis present

## 2024-03-23 DIAGNOSIS — Z7902 Long term (current) use of antithrombotics/antiplatelets: Secondary | ICD-10-CM | POA: Diagnosis not present

## 2024-03-23 DIAGNOSIS — Z951 Presence of aortocoronary bypass graft: Secondary | ICD-10-CM

## 2024-03-23 DIAGNOSIS — Z886 Allergy status to analgesic agent status: Secondary | ICD-10-CM | POA: Diagnosis not present

## 2024-03-23 DIAGNOSIS — W07XXXA Fall from chair, initial encounter: Secondary | ICD-10-CM | POA: Diagnosis present

## 2024-03-23 DIAGNOSIS — S72001A Fracture of unspecified part of neck of right femur, initial encounter for closed fracture: Secondary | ICD-10-CM

## 2024-03-23 DIAGNOSIS — G20A1 Parkinson's disease without dyskinesia, without mention of fluctuations: Secondary | ICD-10-CM | POA: Diagnosis present

## 2024-03-23 DIAGNOSIS — I10 Essential (primary) hypertension: Secondary | ICD-10-CM | POA: Diagnosis not present

## 2024-03-23 DIAGNOSIS — N189 Chronic kidney disease, unspecified: Secondary | ICD-10-CM | POA: Diagnosis not present

## 2024-03-23 DIAGNOSIS — Z8673 Personal history of transient ischemic attack (TIA), and cerebral infarction without residual deficits: Secondary | ICD-10-CM | POA: Diagnosis not present

## 2024-03-23 DIAGNOSIS — R52 Pain, unspecified: Principal | ICD-10-CM

## 2024-03-23 DIAGNOSIS — S7222XD Displaced subtrochanteric fracture of left femur, subsequent encounter for closed fracture with routine healing: Secondary | ICD-10-CM

## 2024-03-23 DIAGNOSIS — I252 Old myocardial infarction: Secondary | ICD-10-CM

## 2024-03-23 DIAGNOSIS — S72001D Fracture of unspecified part of neck of right femur, subsequent encounter for closed fracture with routine healing: Secondary | ICD-10-CM | POA: Diagnosis not present

## 2024-03-23 DIAGNOSIS — Y92010 Kitchen of single-family (private) house as the place of occurrence of the external cause: Secondary | ICD-10-CM

## 2024-03-23 DIAGNOSIS — Z79899 Other long term (current) drug therapy: Secondary | ICD-10-CM | POA: Diagnosis not present

## 2024-03-23 DIAGNOSIS — R000 Tachycardia, unspecified: Secondary | ICD-10-CM | POA: Diagnosis not present

## 2024-03-23 DIAGNOSIS — J84112 Idiopathic pulmonary fibrosis: Secondary | ICD-10-CM | POA: Diagnosis not present

## 2024-03-23 DIAGNOSIS — S72351D Displaced comminuted fracture of shaft of right femur, subsequent encounter for closed fracture with routine healing: Secondary | ICD-10-CM | POA: Diagnosis not present

## 2024-03-23 DIAGNOSIS — I2510 Atherosclerotic heart disease of native coronary artery without angina pectoris: Secondary | ICD-10-CM | POA: Diagnosis present

## 2024-03-23 DIAGNOSIS — S7291XA Unspecified fracture of right femur, initial encounter for closed fracture: Secondary | ICD-10-CM | POA: Diagnosis not present

## 2024-03-23 DIAGNOSIS — G4733 Obstructive sleep apnea (adult) (pediatric): Secondary | ICD-10-CM | POA: Diagnosis present

## 2024-03-23 DIAGNOSIS — F0670 Mild neurocognitive disorder due to known physiological condition without behavioral disturbance: Secondary | ICD-10-CM | POA: Diagnosis present

## 2024-03-23 DIAGNOSIS — M6281 Muscle weakness (generalized): Secondary | ICD-10-CM | POA: Diagnosis not present

## 2024-03-23 DIAGNOSIS — E785 Hyperlipidemia, unspecified: Secondary | ICD-10-CM | POA: Diagnosis not present

## 2024-03-23 DIAGNOSIS — Z743 Need for continuous supervision: Secondary | ICD-10-CM | POA: Diagnosis not present

## 2024-03-23 DIAGNOSIS — Z888 Allergy status to other drugs, medicaments and biological substances status: Secondary | ICD-10-CM | POA: Diagnosis not present

## 2024-03-23 DIAGNOSIS — I129 Hypertensive chronic kidney disease with stage 1 through stage 4 chronic kidney disease, or unspecified chronic kidney disease: Secondary | ICD-10-CM | POA: Diagnosis not present

## 2024-03-23 DIAGNOSIS — Z8249 Family history of ischemic heart disease and other diseases of the circulatory system: Secondary | ICD-10-CM | POA: Diagnosis not present

## 2024-03-23 DIAGNOSIS — S7221XA Displaced subtrochanteric fracture of right femur, initial encounter for closed fracture: Secondary | ICD-10-CM | POA: Diagnosis not present

## 2024-03-23 DIAGNOSIS — S79911A Unspecified injury of right hip, initial encounter: Secondary | ICD-10-CM | POA: Diagnosis not present

## 2024-03-23 DIAGNOSIS — D649 Anemia, unspecified: Secondary | ICD-10-CM | POA: Diagnosis not present

## 2024-03-23 DIAGNOSIS — D631 Anemia in chronic kidney disease: Secondary | ICD-10-CM | POA: Diagnosis not present

## 2024-03-23 DIAGNOSIS — S72351A Displaced comminuted fracture of shaft of right femur, initial encounter for closed fracture: Secondary | ICD-10-CM | POA: Diagnosis not present

## 2024-03-23 DIAGNOSIS — Z955 Presence of coronary angioplasty implant and graft: Secondary | ICD-10-CM | POA: Diagnosis not present

## 2024-03-23 DIAGNOSIS — I700 Atherosclerosis of aorta: Secondary | ICD-10-CM | POA: Diagnosis not present

## 2024-03-23 DIAGNOSIS — Z043 Encounter for examination and observation following other accident: Secondary | ICD-10-CM | POA: Diagnosis not present

## 2024-03-23 DIAGNOSIS — M25461 Effusion, right knee: Secondary | ICD-10-CM | POA: Diagnosis not present

## 2024-03-23 DIAGNOSIS — W19XXXA Unspecified fall, initial encounter: Secondary | ICD-10-CM | POA: Diagnosis not present

## 2024-03-23 DIAGNOSIS — Z8041 Family history of malignant neoplasm of ovary: Secondary | ICD-10-CM

## 2024-03-23 DIAGNOSIS — S72121D Displaced fracture of lesser trochanter of right femur, subsequent encounter for closed fracture with routine healing: Secondary | ICD-10-CM | POA: Diagnosis not present

## 2024-03-23 DIAGNOSIS — R001 Bradycardia, unspecified: Secondary | ICD-10-CM | POA: Diagnosis not present

## 2024-03-23 DIAGNOSIS — S72331A Displaced oblique fracture of shaft of right femur, initial encounter for closed fracture: Secondary | ICD-10-CM | POA: Diagnosis not present

## 2024-03-23 DIAGNOSIS — S72121A Displaced fracture of lesser trochanter of right femur, initial encounter for closed fracture: Secondary | ICD-10-CM | POA: Diagnosis not present

## 2024-03-23 DIAGNOSIS — S7291XD Unspecified fracture of right femur, subsequent encounter for closed fracture with routine healing: Secondary | ICD-10-CM | POA: Diagnosis not present

## 2024-03-23 LAB — CBC WITH DIFFERENTIAL/PLATELET
Abs Immature Granulocytes: 0.05 10*3/uL (ref 0.00–0.07)
Basophils Absolute: 0 10*3/uL (ref 0.0–0.1)
Basophils Relative: 0 %
Eosinophils Absolute: 0 10*3/uL (ref 0.0–0.5)
Eosinophils Relative: 0 %
HCT: 25.2 % — ABNORMAL LOW (ref 39.0–52.0)
Hemoglobin: 8 g/dL — ABNORMAL LOW (ref 13.0–17.0)
Immature Granulocytes: 1 %
Lymphocytes Relative: 19 %
Lymphs Abs: 1.3 10*3/uL (ref 0.7–4.0)
MCH: 34.2 pg — ABNORMAL HIGH (ref 26.0–34.0)
MCHC: 31.7 g/dL (ref 30.0–36.0)
MCV: 107.7 fL — ABNORMAL HIGH (ref 80.0–100.0)
Monocytes Absolute: 0.8 10*3/uL (ref 0.1–1.0)
Monocytes Relative: 12 %
Neutro Abs: 4.7 10*3/uL (ref 1.7–7.7)
Neutrophils Relative %: 68 %
Platelets: 215 10*3/uL (ref 150–400)
RBC: 2.34 MIL/uL — ABNORMAL LOW (ref 4.22–5.81)
RDW: 15.7 % — ABNORMAL HIGH (ref 11.5–15.5)
WBC: 6.8 10*3/uL (ref 4.0–10.5)
nRBC: 0 % (ref 0.0–0.2)

## 2024-03-23 LAB — BASIC METABOLIC PANEL WITH GFR
Anion gap: 7 (ref 5–15)
BUN: 36 mg/dL — ABNORMAL HIGH (ref 8–23)
CO2: 27 mmol/L (ref 22–32)
Calcium: 8.2 mg/dL — ABNORMAL LOW (ref 8.9–10.3)
Chloride: 103 mmol/L (ref 98–111)
Creatinine, Ser: 1.36 mg/dL — ABNORMAL HIGH (ref 0.61–1.24)
GFR, Estimated: 51 mL/min — ABNORMAL LOW (ref >60)
Glucose, Bld: 149 mg/dL — ABNORMAL HIGH (ref 70–99)
Potassium: 4.3 mmol/L (ref 3.5–5.1)
Sodium: 137 mmol/L (ref 135–145)

## 2024-03-23 LAB — PROTIME-INR
INR: 1.3 — ABNORMAL HIGH (ref 0.8–1.2)
Prothrombin Time: 15.9 s — ABNORMAL HIGH (ref 11.4–15.2)

## 2024-03-23 LAB — PREPARE RBC (CROSSMATCH)

## 2024-03-23 MED ORDER — PANTOPRAZOLE SODIUM 40 MG PO TBEC
40.0000 mg | DELAYED_RELEASE_TABLET | Freq: Every day | ORAL | Status: DC
  Administered 2024-03-24 – 2024-03-27 (×4): 40 mg via ORAL
  Filled 2024-03-23 (×4): qty 1

## 2024-03-23 MED ORDER — TRANEXAMIC ACID-NACL 1000-0.7 MG/100ML-% IV SOLN
1000.0000 mg | INTRAVENOUS | Status: AC
  Administered 2024-03-23: 1000 mg via INTRAVENOUS
  Filled 2024-03-23: qty 100

## 2024-03-23 MED ORDER — ENSURE PRE-SURGERY PO LIQD
296.0000 mL | Freq: Once | ORAL | Status: AC
  Administered 2024-03-23: 296 mL via ORAL
  Filled 2024-03-23: qty 296

## 2024-03-23 MED ORDER — POVIDONE-IODINE 10 % EX SWAB
2.0000 | Freq: Once | CUTANEOUS | Status: AC
Start: 2024-03-23 — End: 2024-03-24
  Administered 2024-03-24: 2 via TOPICAL

## 2024-03-23 MED ORDER — POLYETHYLENE GLYCOL 3350 17 G PO PACK
17.0000 g | PACK | Freq: Every day | ORAL | Status: DC | PRN
  Administered 2024-03-26: 17 g via ORAL
  Filled 2024-03-23: qty 1

## 2024-03-23 MED ORDER — SODIUM CHLORIDE 0.9 % IV SOLN
INTRAVENOUS | Status: AC
Start: 2024-03-23 — End: 2024-03-23

## 2024-03-23 MED ORDER — TRIMETHOBENZAMIDE HCL 100 MG/ML IM SOLN: 200.0000 mg | Freq: Four times a day (QID) | INTRAMUSCULAR | Status: DC | PRN

## 2024-03-23 MED ORDER — CHLORHEXIDINE GLUCONATE 4 % EX SOLN
60.0000 mL | Freq: Once | CUTANEOUS | Status: AC
  Administered 2024-03-24: 4 via TOPICAL
  Filled 2024-03-23: qty 60

## 2024-03-23 MED ORDER — ATORVASTATIN CALCIUM 80 MG PO TABS
80.0000 mg | ORAL_TABLET | Freq: Every evening | ORAL | Status: DC
  Administered 2024-03-24 – 2024-03-26 (×3): 80 mg via ORAL
  Filled 2024-03-23 (×3): qty 1

## 2024-03-23 MED ORDER — FENTANYL CITRATE PF 50 MCG/ML IJ SOSY
25.0000 ug | PREFILLED_SYRINGE | INTRAMUSCULAR | Status: DC | PRN
  Administered 2024-03-24: 50 ug via INTRAVENOUS
  Filled 2024-03-23: qty 1

## 2024-03-23 MED ORDER — CARVEDILOL 3.125 MG PO TABS
3.1250 mg | ORAL_TABLET | Freq: Two times a day (BID) | ORAL | Status: DC
  Administered 2024-03-23 – 2024-03-27 (×6): 3.125 mg via ORAL
  Filled 2024-03-23 (×7): qty 1

## 2024-03-23 MED ORDER — CEFAZOLIN SODIUM-DEXTROSE 2-4 GM/100ML-% IV SOLN
2.0000 g | INTRAVENOUS | Status: AC
  Administered 2024-03-24: 2 g via INTRAVENOUS
  Filled 2024-03-23: qty 100

## 2024-03-23 MED ORDER — SODIUM CHLORIDE 0.9% IV SOLUTION: Freq: Once | INTRAVENOUS | Status: AC

## 2024-03-23 MED ORDER — CARBIDOPA-LEVODOPA 25-100 MG PO TABS
1.0000 | ORAL_TABLET | Freq: Three times a day (TID) | ORAL | Status: DC
  Administered 2024-03-23 – 2024-03-27 (×11): 1 via ORAL
  Filled 2024-03-23 (×11): qty 1

## 2024-03-23 MED ORDER — OXYCODONE HCL 5 MG PO TABS
5.0000 mg | ORAL_TABLET | ORAL | Status: DC | PRN
  Administered 2024-03-24 – 2024-03-25 (×2): 5 mg via ORAL
  Filled 2024-03-23 (×2): qty 1

## 2024-03-23 MED ORDER — CARVEDILOL 3.125 MG PO TABS: 3.1250 mg | ORAL_TABLET | Freq: Two times a day (BID) | ORAL | Status: DC

## 2024-03-23 MED ORDER — MORPHINE SULFATE (PF) 4 MG/ML IV SOLN
4.0000 mg | INTRAVENOUS | Status: DC | PRN
  Administered 2024-03-23: 4 mg via INTRAVENOUS
  Filled 2024-03-23: qty 1

## 2024-03-23 MED ORDER — METHOCARBAMOL 500 MG PO TABS
500.0000 mg | ORAL_TABLET | Freq: Four times a day (QID) | ORAL | Status: DC | PRN
  Administered 2024-03-25 – 2024-03-26 (×2): 500 mg via ORAL
  Filled 2024-03-23 (×2): qty 1

## 2024-03-23 NOTE — ED Notes (Signed)
 Xray tech at bedside.

## 2024-03-23 NOTE — ED Triage Notes (Addendum)
 Right femur fx from [facility].  Pt denies pain at the present.

## 2024-03-23 NOTE — ED Provider Notes (Signed)
 Emergency Department Provider Note   I have reviewed the triage vital signs and the nursing notes.   HISTORY  Chief Complaint Hip Problem   HPI Matthew Liu is a 83 y.o. male with past history reviewed below presents emergency department with right hip pain and fracture.  Patient initially went to the [facility] after a fall at home.  He was sitting between 2 cabinets in the kitchen when he fell to the ground in a seated position.  He was there for 1 to 2 hours and ultimately discovered by family who called EMS.  He initially went to [facility] where they diagnosed a femoral fracture.  They discussed with orthopedics on-call and patient was transferred to the emergency department here for further evaluation.  Patient denies any numbness in the leg.  Of note, patient did have a fracture of the left hip last month which was repaired by Dr. Curtiss Dowdy. No head trauma.    Past Medical History:  Diagnosis Date   Acute ischemic stroke (HCC) 09/15/2015   Ankle fracture, left 2009   rod placed   Ankle fracture, left    rod placed   Anxiety    Aspirin  allergy    a. Anaphylactic rxn.   Barrett's esophagus    CAD (coronary artery disease)    a. Inf STEMI 07/2013: s/p DES x 2 from ostial to [facility] 07/13/13. Residual Cx disease for med rx (PCI if recurrent pain).   Controlled gout    Dyslipidemia    ED (erectile dysfunction)    Edema of both legs    Gastric polyp    GERD (gastroesophageal reflux disease)    Hiatal hernia    History of nuclear stress test    Nuclear stress test 5/19: EF 53, no ischemia, no infarction; Low Risk   Hydrocele of testis    Hyperglycemia    a. A1C 5.8 in 07/2013.   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 Respiratory: Denies shortness of breath. Gastrointestinal: No abdominal pain.  No nausea, no vomiting.   Skin: Negative for rash. Neurological: Negative for headaches, focal weakness or numbness. ____________________________________________   PHYSICAL EXAM:  VITAL SIGNS: ED Triage Vitals [03/23/24 1727]  Encounter Vitals Group     BP 128/64     Pulse Rate (!) 119     Resp 18     Temp 97.7 F (36.5 C)     Temp Source Oral     SpO2 100 %    Constitutional: Alert and oriented. Well appearing and in no acute distress. Eyes: Conjunctivae are normal. Head: Atraumatic. Nose: No congestion/rhinnorhea. Mouth/Throat: Mucous membranes are moist.  Neck: No stridor.  Cardiovascular: Normal rate, regular rhythm. Good peripheral circulation. Grossly normal heart sounds.   Respiratory: Normal respiratory effort.  No retractions. Lungs CTAB. Gastrointestinal: Soft and nontender. No distention.  Musculoskeletal: Shortening and external rotation of the right hip. Intact sensation and pulses. No knee tenderness. Compartments are soft.  Neurologic:  Normal speech and language. No gross  focal neurologic deficits are appreciated.  Skin:  Skin is warm, dry and intact. No rash noted.  ____________________________________________   LABS (all labs ordered are listed, but only abnormal results are displayed)  Labs Reviewed  BASIC METABOLIC PANEL WITH GFR - Abnormal; Notable for the following components:      Result Value   Glucose, Bld 149 (*)    BUN 36 (*)    Creatinine, Ser 1.36 (*)    Calcium  8.2 (*)    GFR, Estimated 51 (*)    All other components within normal limits  CBC WITH DIFFERENTIAL/PLATELET - Abnormal; Notable for the following components:   RBC 2.34 (*)    Hemoglobin 8.0 (*)    HCT 25.2 (*)     MCV 107.7 (*)    MCH 34.2 (*)    RDW 15.7 (*)    All other components within normal limits  PROTIME-INR - Abnormal; Notable for the following components:   Prothrombin Time 15.9 (*)    INR 1.3 (*)    All other components within normal limits  CBC  BASIC METABOLIC PANEL WITH GFR  MAGNESIUM   TYPE AND SCREEN   ____________________________________________  EKG   EKG Interpretation Date/Time:  Saturday March 23 2024 17:26:40 EDT Ventricular Rate:  125 PR Interval:  76 QRS Duration:  92 QT Interval:  344 QTC Calculation: 497 R Axis:   -63  Text Interpretation: Sinus tachycardia Left anterior fascicular block Probable LVH with secondary repol abnrm Anterior Q waves, possibly due to LVH Baseline wander in lead(s) V6 Similar irregular tachycardia compared to prior Confirmed by Abby Hocking [phone]) on 03/23/2024 6:06:43 PM        ____________________________________________   PROCEDURES  Procedure(s) performed:   Procedures  None ____________________________________________   INITIAL IMPRESSION / ASSESSMENT AND PLAN / ED COURSE  Pertinent labs & imaging results that were available during my care of the patient were reviewed by me and considered in my medical decision making (see chart for details).   This patient is Presenting for Evaluation of hip pain/fall, which does require a range of treatment options, and is a complaint that involves a high risk of morbidity and mortality.  The Differential Diagnoses include fracture, dislocation, MSK strain, etc.  Critical Interventions-    Medications  atorvastatin  (LIPITOR ) tablet 80 mg (has no administration in time range)  carvedilol  (COREG ) tablet 3.125 mg (has no administration in time range)  pantoprazole  (PROTONIX ) EC tablet 40 mg (has no administration in time range)  carbidopa -levodopa  (SINEMET  IR) 25-100 MG per tablet immediate release 1 tablet (has no administration in time range)  0.9 %  sodium chloride  infusion  (has no administration in time range)  oxyCODONE  (Oxy IR/ROXICODONE ) immediate release tablet 5 mg (has no administration in time range)  fentaNYL  (SUBLIMAZE ) injection 25-50 mcg (has no administration in time range)  methocarbamol  (ROBAXIN ) tablet 500 mg (has no administration in time range)  polyethylene glycol (MIRALAX  / GLYCOLAX ) packet 17 g (has no administration in time range)  trimethobenzamide (TIGAN) injection 200 mg (has no administration in time range)    Reassessment after intervention:  pain improved.    I did obtain Additional Historical Information from daughter at bedside.    Clinical Laboratory Tests Ordered, included CBC with anemia to 8.0. No AKI.   Radiologic Tests Ordered, included Pelvis XR. I independently interpreted the images and agree with radiology interpretation.   Cardiac Monitor Tracing which shows irregular sinus tachycardia.    Social Determinants of Health Risk patient is  a non-smoker.   Consult complete with Dr. Pryor Browning. Plan for Buck's traction (10lbs) and they will consult this evening for surgery tomorrow.   TRH, Dr. Brice Campi. Plan for admit.   Medical Decision Making: Summary:  Patient presents emergency department as a transfer from [facility].  Written report of the hip x-ray describes a proximal femoral fracture.  Dr. Pryor Browning would like the patient in Buck's traction with plan for the OR tomorrow.  Patient arrives with a disc with his images.  I will try and upload these for our reference but if we cannot will need to repeat images.   Reevaluation with update and discussion with patient and family. Images from disc not uploading to PACS. Will obtain XR of the pelvis and chest. Plan for admit. Surgery likely in the AM.   Patient's presentation is most consistent with acute presentation with potential threat to life or bodily function.   Disposition: admit  ____________________________________________  FINAL CLINICAL IMPRESSION(S)  / ED DIAGNOSES  Final diagnoses:  Closed fracture of right hip, initial encounter [facility])    Note:  This document was prepared using Dragon voice recognition software and may include unintentional dictation errors.  Abby Hocking, MD, [facility] Emergency Medicine    Evely Gainey, Shereen Dike, MD 03/23/24 [phone]

## 2024-03-23 NOTE — ED Notes (Signed)
 Pt transferred to a hospital bed. Ortho tech at bedside to place in bucks traction.

## 2024-03-23 NOTE — H&P (View-Only) (Signed)
 ORTHOPAEDIC CONSULTATION  REQUESTING PHYSICIAN: Long, Shereen Dike, MD  Chief Complaint: right hip fracture  HPI: Matthew Liu is a 83 y.o. male who had a fall at home this morning resulting in a injury to his right hip.  Unable to ambulate.  Was brought to [facility] found to have a right hip fracture he had just recently had surgery for a left hip fracture with Dr. Curtiss Dowdy 1 month ago.  He had been coming along well since the surgery without any issues.  He just discharged from rehab 2 weeks ago.  He is currently laying in bed.  He is tired but answering questions.  He localizes pain to the right hip area denies pain other joints or extremities.  His family is at bedside.  Past Medical History:  Diagnosis Date   Acute ischemic stroke (HCC) 09/15/2015   Ankle fracture, left 2009   rod placed   Ankle fracture, left    rod placed   Anxiety    Aspirin  allergy    a. Anaphylactic rxn.   Barrett's esophagus    CAD (coronary artery disease)    a. Inf STEMI 07/2013: s/p DES x 2 from ostial to [facility] 07/13/13. Residual Cx disease for med rx (PCI if recurrent pain).   Controlled gout    Dyslipidemia    ED (erectile dysfunction)    Edema of both legs    Gastric polyp    GERD (gastroesophageal reflux disease)    Hiatal hernia    History of nuclear stress test    Nuclear stress test 5/19: EF 53, no ischemia, no infarction; Low Risk   Hydrocele of testis    Hyperglycemia    a. A1C 5.8 in 07/2013.   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   LIMA to LAD, SVG to Diag, Sequential SVG to OM2-OM3, SVG to RCA, EVH via right thigh and leg   Unstable angina [facility])    Past Surgical History:  Procedure Laterality Date   BACK SURGERY     L1 kyphoplasty by Dr Gwendlyn Lemmings 03/2018     thoractic  02-2018   cataract surgery      bilateral    COLONOSCOPY     CORONARY ANGIOPLASTY     stents    CORONARY ARTERY BYPASS GRAFT N/A 12/12/2018   Procedure: CORONARY ARTERY BYPASS GRAFTING (CABG);  Surgeon: Gardenia Jump, MD;  Location: [facility] OR;  Service: Open Heart Surgery;  Laterality: N/A;   ESOPHAGOGASTRODUODENOSCOPY  04/04/2016   Short-segment Barrett's esophagus (biopsied) Gastric polyps (status post polypectomy x1) Hiatal hernia   HYDROCELE EXCISION Left 07/17/2014   Procedure: LEFT HYDROCELECTOMY;  Surgeon: Willye Harvey, MD;  Location: [facility] ORS;  Service: Urology;  Laterality: Left;   INTRAMEDULLARY (IM) NAIL INTERTROCHANTERIC Left 02/19/2024   Procedure: FIXATION, FRACTURE, INTERTROCHANTERIC, WITH INTRAMEDULLARY ROD;  Surgeon: Laneta Pintos, MD;  Location: [facility] OR;  Service: Orthopedics;  Laterality: Left;   left ankle surgery  1999   LEFT HEART CATH AND CORONARY ANGIOGRAPHY N/A 12/11/2018   Procedure: LEFT HEART CATH AND CORONARY ANGIOGRAPHY;  Surgeon: Arty Binning, MD;  Location: [facility] INVASIVE CV LAB;  Service: Cardiovascular;  Laterality: N/A;   LEFT HEART CATHETERIZATION WITH CORONARY ANGIOGRAM N/A 07/13/2013   Procedure: LEFT  HEART CATHETERIZATION WITH CORONARY ANGIOGRAM;  Surgeon: Mickiel Albany, MD;  Location: [facility] CATH LAB;  Service: Cardiovascular;  Laterality: N/A;   PERCUTANEOUS CORONARY STENT INTERVENTION (PCI-S)  07/13/2013   Procedure: PERCUTANEOUS CORONARY STENT INTERVENTION (PCI-S);  Surgeon: Mickiel Albany, MD;  Location: [facility] CATH LAB;  Service: Cardiovascular;;   TEE WITHOUT CARDIOVERSION N/A 12/12/2018   Procedure: TRANSESOPHAGEAL ECHOCARDIOGRAM (TEE);  Surgeon: Gardenia Jump, MD;  Location: [facility] OR;  Service: Open Heart Surgery;  Laterality: N/A;   TONSILLECTOMY     Social History   Socioeconomic History   Marital status: Married    Spouse name: Pat   Number of children: 2   Years of education: 12   Highest education level: Not on file  Occupational History    Occupation: Chiropractor  Tobacco Use   Smoking status: Never   Smokeless tobacco: Never  Vaping Use   Vaping status: Never Used  Substance and Sexual Activity   Alcohol use: Yes    Comment: occasional beer    Drug use: No   Sexual activity: Not Currently  Other Topics Concern   Not on file  Social History Narrative   Lives with wife   Caffeine use: 1-2 cups coffee per day   Right handed   Social Drivers of Health   Financial Resource Strain: Not on file  Food Insecurity: No Food Insecurity (02/18/2024)   Hunger Vital Sign    Worried About Running Out of Food in the Last Year: Never true    Ran Out of Food in the Last Year: Never true  Transportation Needs: No Transportation Needs (02/18/2024)   PRAPARE - Administrator, Civil Service (Medical): No    Lack of Transportation (Non-Medical): No  Physical Activity: Not on file  Stress: Not on file  Social Connections: Moderately Integrated (02/18/2024)   Social Connection and Isolation Panel    Frequency of Communication with Friends and Family: Three times a week    Frequency of Social Gatherings with Friends and Family: Three times a week    Attends Religious Services: 1 to 4 times per year    Active Member of Clubs or Organizations: No    Attends Engineer, structural: Never    Marital Status: Married   Family History  Problem Relation Age of Onset   Hypertension Mother    Ovarian cancer Mother    Heart failure Father    Stroke Neg Hx    Allergies  Allergen Reactions   Aspirin  Anaphylaxis and Shortness Of Breath   Quinine Derivatives Hives and Other (See Comments)    looks like he has the measles     Positive ROS: All other systems have been reviewed and were otherwise negative with the exception of those mentioned in the HPI and as above.  Physical Exam: General: Alert, no acute distress Cardiovascular: No pedal edema Respiratory: No cyanosis, no use of accessory  musculature Skin: No lesions in the area of chief complaint Neurologic: Sensation intact distally Psychiatric: Patient is competent for consent with normal mood and affect  MUSCULOSKELETAL:  RLE No traumatic wounds, ecchymosis, or rash  Mild swelling about the thigh, pain with logroll  No knee or ankle effusion  Sens DPN, SPN, TN intact  Motor EHL, ext, flex 5/5  DP 2+, PT 2+, No significant edema    IMAGING: X-rays right femur 2 view demonstrate displaced subtrochanteric femur fracture  Assessment: Active Problems:   * No active  hospital problems. *  Right subtrochanteric femur fracture  Plan: Discussed with patient and family unfortunately has a subtrochanteric femur fracture very similar to the one he sustained a month ago that was fixed by Dr. Curtiss Dowdy for his left.  Recommended against surgery now for the right side.  Will be in Buck's traction 10 pounds for immobilization.  N.p.o. after midnight.  Patient likely to benefit from blood transfusion perioperatively.  The risks benefits and alternatives were discussed with the patient including but not limited to the risks of nonoperative treatment, versus surgical intervention including infection, bleeding, nerve injury, malunion, nonunion, the need for revision surgery, hardware prominence, hardware failure, the need for hardware removal, blood clots, cardiopulmonary complications, morbidity, mortality, among others, and they were willing to proceed.    Murleen Arms, MD  Contact information:   [id number] 7am-5pm epic message Dr. Pryor Browning, or call office for patient follow up: [phone] After hours and holidays please check [email or web address] for group call information for [facility]

## 2024-03-23 NOTE — ED Notes (Signed)
 Pt transferred to our stretcher, placed on the monitor. Right leg shorted and rotated. +pedal pulse.  Pt cleaned. Pants cut off and thrown away per patient ok.   Call bell within reach. Denies needs right now.

## 2024-03-23 NOTE — ED Notes (Signed)
Got patient into a gown on the monitor patient is resting with family at bedside and call bell in reach 

## 2024-03-23 NOTE — ED Notes (Signed)
 Pt resting, family at bedside. No needs voiced.

## 2024-03-23 NOTE — Consult Note (Signed)
 ORTHOPAEDIC CONSULTATION  REQUESTING PHYSICIAN: Long, Shereen Dike, MD  Chief Complaint: right hip fracture  HPI: Matthew Liu is a 83 y.o. male who had a fall at home this morning resulting in a injury to his right hip.  Unable to ambulate.  Was brought to [facility] found to have a right hip fracture he had just recently had surgery for a left hip fracture with Dr. Curtiss Dowdy 1 month ago.  He had been coming along well since the surgery without any issues.  He just discharged from rehab 2 weeks ago.  He is currently laying in bed.  He is tired but answering questions.  He localizes pain to the right hip area denies pain other joints or extremities.  His family is at bedside.  Past Medical History:  Diagnosis Date   Acute ischemic stroke (HCC) 09/15/2015   Ankle fracture, left 2009   rod placed   Ankle fracture, left    rod placed   Anxiety    Aspirin  allergy    a. Anaphylactic rxn.   Barrett's esophagus    CAD (coronary artery disease)    a. Inf STEMI 07/2013: s/p DES x 2 from ostial to [facility] 07/13/13. Residual Cx disease for med rx (PCI if recurrent pain).   Controlled gout    Dyslipidemia    ED (erectile dysfunction)    Edema of both legs    Gastric polyp    GERD (gastroesophageal reflux disease)    Hiatal hernia    History of nuclear stress test    Nuclear stress test 5/19: EF 53, no ischemia, no infarction; Low Risk   Hydrocele of testis    Hyperglycemia    a. A1C 5.8 in 07/2013.   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   LIMA to LAD, SVG to Diag, Sequential SVG to OM2-OM3, SVG to RCA, EVH via right thigh and leg   Unstable angina [facility])    Past Surgical History:  Procedure Laterality Date   BACK SURGERY     L1 kyphoplasty by Dr Gwendlyn Lemmings 03/2018     thoractic  02-2018   cataract surgery      bilateral    COLONOSCOPY     CORONARY ANGIOPLASTY     stents    CORONARY ARTERY BYPASS GRAFT N/A 12/12/2018   Procedure: CORONARY ARTERY BYPASS GRAFTING (CABG);  Surgeon: Gardenia Jump, MD;  Location: [facility] OR;  Service: Open Heart Surgery;  Laterality: N/A;   ESOPHAGOGASTRODUODENOSCOPY  04/04/2016   Short-segment Barrett's esophagus (biopsied) Gastric polyps (status post polypectomy x1) Hiatal hernia   HYDROCELE EXCISION Left 07/17/2014   Procedure: LEFT HYDROCELECTOMY;  Surgeon: Willye Harvey, MD;  Location: [facility] ORS;  Service: Urology;  Laterality: Left;   INTRAMEDULLARY (IM) NAIL INTERTROCHANTERIC Left 02/19/2024   Procedure: FIXATION, FRACTURE, INTERTROCHANTERIC, WITH INTRAMEDULLARY ROD;  Surgeon: Laneta Pintos, MD;  Location: [facility] OR;  Service: Orthopedics;  Laterality: Left;   left ankle surgery  1999   LEFT HEART CATH AND CORONARY ANGIOGRAPHY N/A 12/11/2018   Procedure: LEFT HEART CATH AND CORONARY ANGIOGRAPHY;  Surgeon: Arty Binning, MD;  Location: [facility] INVASIVE CV LAB;  Service: Cardiovascular;  Laterality: N/A;   LEFT HEART CATHETERIZATION WITH CORONARY ANGIOGRAM N/A 07/13/2013   Procedure: LEFT  HEART CATHETERIZATION WITH CORONARY ANGIOGRAM;  Surgeon: Mickiel Albany, MD;  Location: [facility] CATH LAB;  Service: Cardiovascular;  Laterality: N/A;   PERCUTANEOUS CORONARY STENT INTERVENTION (PCI-S)  07/13/2013   Procedure: PERCUTANEOUS CORONARY STENT INTERVENTION (PCI-S);  Surgeon: Mickiel Albany, MD;  Location: [facility] CATH LAB;  Service: Cardiovascular;;   TEE WITHOUT CARDIOVERSION N/A 12/12/2018   Procedure: TRANSESOPHAGEAL ECHOCARDIOGRAM (TEE);  Surgeon: Gardenia Jump, MD;  Location: [facility] OR;  Service: Open Heart Surgery;  Laterality: N/A;   TONSILLECTOMY     Social History   Socioeconomic History   Marital status: Married    Spouse name: Pat   Number of children: 2   Years of education: 12   Highest education level: Not on file  Occupational History    Occupation: Chiropractor  Tobacco Use   Smoking status: Never   Smokeless tobacco: Never  Vaping Use   Vaping status: Never Used  Substance and Sexual Activity   Alcohol use: Yes    Comment: occasional beer    Drug use: No   Sexual activity: Not Currently  Other Topics Concern   Not on file  Social History Narrative   Lives with wife   Caffeine use: 1-2 cups coffee per day   Right handed   Social Drivers of Health   Financial Resource Strain: Not on file  Food Insecurity: No Food Insecurity (02/18/2024)   Hunger Vital Sign    Worried About Running Out of Food in the Last Year: Never true    Ran Out of Food in the Last Year: Never true  Transportation Needs: No Transportation Needs (02/18/2024)   PRAPARE - Administrator, Civil Service (Medical): No    Lack of Transportation (Non-Medical): No  Physical Activity: Not on file  Stress: Not on file  Social Connections: Moderately Integrated (02/18/2024)   Social Connection and Isolation Panel    Frequency of Communication with Friends and Family: Three times a week    Frequency of Social Gatherings with Friends and Family: Three times a week    Attends Religious Services: 1 to 4 times per year    Active Member of Clubs or Organizations: No    Attends Engineer, structural: Never    Marital Status: Married   Family History  Problem Relation Age of Onset   Hypertension Mother    Ovarian cancer Mother    Heart failure Father    Stroke Neg Hx    Allergies  Allergen Reactions   Aspirin  Anaphylaxis and Shortness Of Breath   Quinine Derivatives Hives and Other (See Comments)    looks like he has the measles     Positive ROS: All other systems have been reviewed and were otherwise negative with the exception of those mentioned in the HPI and as above.  Physical Exam: General: Alert, no acute distress Cardiovascular: No pedal edema Respiratory: No cyanosis, no use of accessory  musculature Skin: No lesions in the area of chief complaint Neurologic: Sensation intact distally Psychiatric: Patient is competent for consent with normal mood and affect  MUSCULOSKELETAL:  RLE No traumatic wounds, ecchymosis, or rash  Mild swelling about the thigh, pain with logroll  No knee or ankle effusion  Sens DPN, SPN, TN intact  Motor EHL, ext, flex 5/5  DP 2+, PT 2+, No significant edema    IMAGING: X-rays right femur 2 view demonstrate displaced subtrochanteric femur fracture  Assessment: Active Problems:   * No active  hospital problems. *  Right subtrochanteric femur fracture  Plan: Discussed with patient and family unfortunately has a subtrochanteric femur fracture very similar to the one he sustained a month ago that was fixed by Dr. Curtiss Dowdy for his left.  Recommended against surgery now for the right side.  Will be in Buck's traction 10 pounds for immobilization.  N.p.o. after midnight.  Patient likely to benefit from blood transfusion perioperatively.  The risks benefits and alternatives were discussed with the patient including but not limited to the risks of nonoperative treatment, versus surgical intervention including infection, bleeding, nerve injury, malunion, nonunion, the need for revision surgery, hardware prominence, hardware failure, the need for hardware removal, blood clots, cardiopulmonary complications, morbidity, mortality, among others, and they were willing to proceed.    Murleen Arms, MD  Contact information:   [id number] 7am-5pm epic message Dr. Pryor Browning, or call office for patient follow up: [phone] After hours and holidays please check [email or web address] for group call information for [facility]

## 2024-03-23 NOTE — Progress Notes (Signed)
 Orthopedic Tech Progress Note Patient Details:  Matthew Liu 10-12-1940 [id number]  Musculoskeletal Traction Type of Traction: Bucks Skin Traction Traction Location: RLE Traction Weight: 10 lbs   Post Interventions Patient Tolerated: Well  Matthew Liu 03/23/2024, 6:58 PM

## 2024-03-23 NOTE — Progress Notes (Signed)
 Orthopedic Tech Progress Note Patient Details:  [name] March 26, 1941 [id number]  Patient ID: [name], male   DOB: Feb 18, 1941, 83 y.o.   MRN: [id number] I was called by the rn to come and check the traction due to the pts leg being lying on its side. I checked with the pt and they were not in pain and were comfortable in the current position. So I left them as they were. [name] 03/23/2024, 11:26 PM

## 2024-03-23 NOTE — H&P (Signed)
 History and Physical    Matthew Liu ZOX:[id number] DOB: 16-Oct-1940 DOA: 03/23/2024  PCP: Adrian Hopper, MD   Patient coming from: Home   Chief Complaint: Fall with right hip pain   HPI: Matthew Liu is a 83 y.o. male with medical history significant for CAD status post CABG, IPF, OSA, history of CVA, Parkinson disease with mild cognitive impairment, and left hip fracture status postsurgical repair on 02/19/2024 who now presents with severe right hip pain after a fall.  Patient and his family report that he had been recovering well from his left femur repair, has been ambulating with a walker, and was in his usual state when his wife fell and he tried to catch her.  He ended up falling onto his bottom.  He has been unable to bear weight on the right leg since then and has severe right hip pain.  He reports that he was doing well prior to this and does not experience chest discomfort or significant dyspnea with activity.  No recent fever or chills.  ED Course: Upon arrival to the ED, patient is found to be afebrile and saturating well on room air with elevated heart rate and stable BP.  Labs are most notable for creatinine 1.36 and hemoglobin 8.0.  Plain radiographs reveal proximal right femur fracture.  There are no acute findings on chest x-ray.  Orthopedic surgery evaluated the patient in the emergency department, he was placed in Buck's traction, and treated with morphine .   Review of Systems:  All other systems reviewed and apart from HPI, are negative.  Past Medical History:  Diagnosis Date   Acute ischemic stroke (HCC) 09/15/2015   Ankle fracture, left 2009   rod placed   Ankle fracture, left    rod placed   Anxiety    Aspirin  allergy    a. Anaphylactic rxn.   Barrett's esophagus    CAD (coronary artery disease)    a. Inf STEMI 07/2013: s/p DES x 2 from ostial to [facility] 07/13/13. Residual Cx disease for med rx (PCI if recurrent pain).   Controlled gout    Dyslipidemia     ED (erectile dysfunction)    Edema of both legs    Gastric polyp    GERD (gastroesophageal reflux disease)    Hiatal hernia    History of nuclear stress test    Nuclear stress test 5/19: EF 53, no ischemia, no infarction; Low Risk   Hydrocele of testis    Hyperglycemia    a. A1C 5.8 in 07/2013.   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   LIMA to LAD, SVG to Diag, Sequential SVG to OM2-OM3, SVG to RCA, EVH via right thigh and leg   Unstable angina [facility])     Past Surgical History:  Procedure Laterality Date   BACK SURGERY     L1 kyphoplasty by Dr Gwendlyn Lemmings 03/2018     thoractic 02-2018   cataract surgery      bilateral    COLONOSCOPY     CORONARY ANGIOPLASTY     stents    CORONARY ARTERY BYPASS GRAFT N/A 12/12/2018   Procedure: CORONARY ARTERY BYPASS GRAFTING (CABG);  Surgeon: Gardenia Jump, MD;  Location: [facility] OR;  Service: Open Heart  Surgery;  Laterality: N/A;   ESOPHAGOGASTRODUODENOSCOPY  04/04/2016   Short-segment Barrett's esophagus (biopsied) Gastric polyps (status post polypectomy x1) Hiatal hernia   HYDROCELE EXCISION Left 07/17/2014   Procedure: LEFT HYDROCELECTOMY;  Surgeon: Willye Harvey, MD;  Location: [facility] ORS;  Service: Urology;  Laterality: Left;   INTRAMEDULLARY (IM) NAIL INTERTROCHANTERIC Left 02/19/2024   Procedure: FIXATION, FRACTURE, INTERTROCHANTERIC, WITH INTRAMEDULLARY ROD;  Surgeon: Laneta Pintos, MD;  Location: [facility] OR;  Service: Orthopedics;  Laterality: Left;   left ankle surgery  1999   LEFT HEART CATH AND CORONARY ANGIOGRAPHY N/A 12/11/2018   Procedure: LEFT HEART CATH AND CORONARY ANGIOGRAPHY;  Surgeon: Arty Binning, MD;  Location: [facility] INVASIVE CV LAB;  Service: Cardiovascular;  Laterality: N/A;   LEFT HEART CATHETERIZATION WITH CORONARY ANGIOGRAM N/A  07/13/2013   Procedure: LEFT HEART CATHETERIZATION WITH CORONARY ANGIOGRAM;  Surgeon: Mickiel Albany, MD;  Location: [facility] CATH LAB;  Service: Cardiovascular;  Laterality: N/A;   PERCUTANEOUS CORONARY STENT INTERVENTION (PCI-S)  07/13/2013   Procedure: PERCUTANEOUS CORONARY STENT INTERVENTION (PCI-S);  Surgeon: Mickiel Albany, MD;  Location: [facility] CATH LAB;  Service: Cardiovascular;;   TEE WITHOUT CARDIOVERSION N/A 12/12/2018   Procedure: TRANSESOPHAGEAL ECHOCARDIOGRAM (TEE);  Surgeon: Gardenia Jump, MD;  Location: [facility] OR;  Service: Open Heart Surgery;  Laterality: N/A;   TONSILLECTOMY      Social History:   reports that he has never smoked. He has never used smokeless tobacco. He reports current alcohol use. He reports that he does not use drugs.  Allergies  Allergen Reactions   Aspirin  Anaphylaxis and Shortness Of Breath   Quinine Derivatives Hives and Other (See Comments)    looks like he has the measles    Family History  Problem Relation Age of Onset   Hypertension Mother    Ovarian cancer Mother    Heart failure Father    Stroke Neg Hx      Prior to Admission medications   Medication Sig Start Date End Date Taking? Authorizing Provider  acetaminophen  (TYLENOL ) 500 MG tablet Take 2 tablets (1,000 mg total) by mouth every 8 (eight) hours. 02/23/24   Barbee Lew, MD  atorvastatin  (LIPITOR ) 80 MG tablet Take 1 tablet (80 mg total) by mouth every evening. 02/08/19   Loleta Ripa, NP  carbidopa -levodopa  (SINEMET  IR) 25-100 MG tablet Take 1 tablet by mouth 3 (three) times daily. 07/28/23   Wertman, Sara E, PA-C  carvedilol  (COREG ) 3.125 MG tablet Take 1 tablet (3.125 mg total) by mouth 2 (two) times daily with a meal. 01/29/24   Turner, Rufus Council, MD  Cholecalciferol  (VITAMIN D3) 50 MCG (2000 UT) capsule Take 2,000 Units by mouth daily.    [provider]  clopidogrel  (PLAVIX ) 75 MG tablet TAKE 1 TABLET BY MOUTH EVERY DAY Patient taking differently: Take 75 mg by  mouth daily. 06/22/20   Arty Binning, MD  cyanocobalamin  (VITAMIN B12) 100 MCG tablet Take 100 mcg by mouth daily.    [provider]  docusate sodium  (COLACE) 100 MG capsule Take 200 mg by mouth 2 (two) times daily.    [provider]  meloxicam (MOBIC) 15 MG tablet Take 15 mg by mouth daily as needed for pain. 01/26/24   [provider]  Multiple Vitamins-Minerals (ONE-A-DAY MENS 50+) TABS Take 1 tablet by mouth daily with breakfast.    [provider]  oxyCODONE  (OXY IR/ROXICODONE ) 5 MG immediate release tablet Take 0.5-1 tablets (2.5-5 mg total) by mouth  every 6 (six) hours as needed for moderate pain (pain score 4-6) or severe pain (pain score 7-10) (2.5 mg for pain score 4-6, 5 mg pain score 7-10). 02/21/24   Versie Gores, PA-C  pantoprazole  (PROTONIX ) 40 MG tablet Take 1 tablet (40 mg total) by mouth daily. 03/08/23   Lajuan Pila, MD  polyethylene glycol (MIRALAX  / GLYCOLAX ) 17 g packet Take 17 g by mouth daily. 02/24/24   Barbee Lew, MD  senna-docusate (SENOKOT-S) 8.6-50 MG tablet Take 1 tablet by mouth at bedtime as needed for mild constipation. 02/23/24   Barbee Lew, MD  tiZANidine (ZANAFLEX) 2 MG tablet Take 2 mg by mouth every 8 (eight) hours as needed for muscle spasms. 01/25/24   [provider]    Physical Exam: Vitals:   03/23/24 1727 03/23/24 1915 03/23/24 2000 03/23/24 2015  BP: 128/64 107/68 121/76 123/77  Pulse: (!) 119 (!) 119 100 (!) 113  Resp: 18 20 20 19   Temp: 97.7 F (36.5 C)   98 F (36.7 C)  TempSrc: Oral     SpO2: 100% 100% 97% 95%    Constitutional: NAD, no diaphoresis   Eyes: PERTLA, lids and conjunctivae normal ENMT: Mucous membranes are moist. Posterior pharynx clear of any exudate or lesions.   Neck: supple, no masses  Respiratory: no wheezing, no crackles. No accessory muscle use.  Cardiovascular: S1 & S2 heard, regular rate and rhythm. Pitting edema involving distal LEs bilaterally.    Abdomen: No tenderness, soft. Bowel sounds active.  Musculoskeletal: no clubbing / cyanosis. Right hip tenderness; neurovascularly intact.   Skin: no significant rashes, lesions, ulcers. Warm, dry, well-perfused. Neurologic: CN 2-12 grossly intact aside from gross hearing deficit. Resting tremor. Moving all extremities. Alert and oriented to person, place, and situation.  Psychiatric: Calm. Cooperative.    Labs and Imaging on Admission: I have personally reviewed following labs and imaging studies  CBC: Recent Labs  Lab 03/23/24 1742  WBC 6.8  NEUTROABS 4.7  HGB 8.0*  HCT 25.2*  MCV 107.7*  PLT 215   Basic Metabolic Panel: Recent Labs  Lab 03/23/24 1742  NA 137  K 4.3  CL 103  CO2 27  GLUCOSE 149*  BUN 36*  CREATININE 1.36*  CALCIUM  8.2*   GFR: CrCl cannot be calculated (Unknown ideal weight.). Liver Function Tests: No results for input(s): AST, ALT, ALKPHOS, BILITOT, PROT, ALBUMIN  in the last 168 hours. No results for input(s): LIPASE, AMYLASE in the last 168 hours. No results for input(s): AMMONIA in the last 168 hours. Coagulation Profile: Recent Labs  Lab 03/23/24 1742  INR 1.3*   Cardiac Enzymes: No results for input(s): CKTOTAL, CKMB, CKMBINDEX, TROPONINI in the last 168 hours. BNP (last 3 results) No results for input(s): PROBNP in the last 8760 hours. HbA1C: No results for input(s): HGBA1C in the last 72 hours. CBG: No results for input(s): GLUCAP in the last 168 hours. Lipid Profile: No results for input(s): CHOL, HDL, LDLCALC, TRIG, CHOLHDL, LDLDIRECT in the last 72 hours. Thyroid  Function Tests: No results for input(s): TSH, T4TOTAL, FREET4, T3FREE, THYROIDAB in the last 72 hours. Anemia Panel: No results for input(s): VITAMINB12, FOLATE, FERRITIN, TIBC, IRON, RETICCTPCT in the last 72 hours. Urine analysis:    Component Value Date/Time   COLORURINE YELLOW 03/25/2022 0039    APPEARANCEUR CLEAR 03/25/2022 0039   LABSPEC 1.012 03/25/2022 0039   PHURINE 5.0 03/25/2022 0039   GLUCOSEU NEGATIVE 03/25/2022 0039   HGBUR SMALL (A) 03/25/2022 0039  BILIRUBINUR NEGATIVE 03/25/2022 0039   KETONESUR NEGATIVE 03/25/2022 0039   PROTEINUR NEGATIVE 03/25/2022 0039   NITRITE NEGATIVE 03/25/2022 0039   LEUKOCYTESUR NEGATIVE 03/25/2022 0039   Sepsis Labs: @LABRCNTIP (procalcitonin:4,lacticidven:4) )No results found for this or any previous visit (from the past 240 hours).   Radiological Exams on Admission: DG Chest Portable 1 View Result Date: 03/23/2024 CLINICAL DATA:  hip fx right EXAM: PORTABLE CHEST 1 VIEW COMPARISON:  Chest x-ray 03/23/2024, CT chest 04/03/2023 FINDINGS: The heart and mediastinal contours are within normal limits. Atherosclerotic plaque. No focal consolidation. No pulmonary edema. No pleural effusion. No pneumothorax. No acute osseous abnormality.  Sternotomy wires are noted. IMPRESSION: 1. No active disease. 2.  Aortic Atherosclerosis (ICD10-I70.0). Electronically Signed   By: Morgane  Naveau M.D.   On: 03/23/2024 20:21   DG Pelvis Portable Result Date: 03/23/2024 CLINICAL DATA:  hip fx right EXAM: PORTABLE PELVIS 1-2 VIEWS COMPARISON:  X-ray pelvis 02/18/2024 FINDINGS: Partially visualized acute displaced proximal right femoral fracture involving the lesser trochanter and proximal shaft. No right hip dislocation. No acute displaced fracture or dislocation of the left hip. Interlocking intramedullary screw partially visualized left femur. There is no evidence of pelvic fracture or diastasis. No pelvic bone lesions are seen. IMPRESSION: Partially visualized acute displaced proximal right femoral fracture involving the lesser trochanter and proximal shaft. Electronically Signed   By: Morgane  Naveau M.D.   On: 03/23/2024 20:15    EKG: Independently reviewed. Sinus tachycardia, rate 125, LAFB.   Assessment/Plan   1. Right hip fracture  - Continue Buck's  traction with 10 lbs, npo after midnight, pain-control, supportive care, hold Plavix     2. Anemia  - Hgb 8 on admission; appears stable, no overt bleeding  - 1 unit RBC ordered for transfusion preoperatively    3. CAD  - No recent angina  - Hold Plavix , continue statin and beta-blocker    4. Parkinson disease with mild cognitive impairment  - Continue Sinemet , use delirium precautions    5. OSA; IPF  - CPAP while sleeping    DVT prophylaxis: SCDs  Code Status: Full  Level of Care: Level of care: Telemetry Medical Family Communication: wife and stepdaughter at bedside  Disposition Plan:  Patient is from: Home  Anticipated d/c is to: SNF  Anticipated d/c date is: 03/27/24  Patient currently: Pending operative femur repair  Consults called: Orthopedic surgery  Admission status: Inpatient     Walton Guppy, MD [facility]  03/23/2024, 8:37 PM

## 2024-03-24 ENCOUNTER — Encounter (HOSPITAL_COMMUNITY): Payer: Self-pay | Admitting: Family Medicine

## 2024-03-24 ENCOUNTER — Inpatient Hospital Stay (HOSPITAL_COMMUNITY)

## 2024-03-24 ENCOUNTER — Inpatient Hospital Stay (HOSPITAL_COMMUNITY): Admitting: Anesthesiology

## 2024-03-24 ENCOUNTER — Encounter (HOSPITAL_COMMUNITY)
Admission: EM | Disposition: A | Discharge status: Skilled Nursing Facility | Payer: Self-pay | Source: Other Acute Inpatient Hospital | Attending: Internal Medicine

## 2024-03-24 ENCOUNTER — Other Ambulatory Visit: Payer: Self-pay

## 2024-03-24 DIAGNOSIS — J84112 Idiopathic pulmonary fibrosis: Secondary | ICD-10-CM

## 2024-03-24 DIAGNOSIS — I2510 Atherosclerotic heart disease of native coronary artery without angina pectoris: Secondary | ICD-10-CM

## 2024-03-24 DIAGNOSIS — G4733 Obstructive sleep apnea (adult) (pediatric): Secondary | ICD-10-CM | POA: Diagnosis not present

## 2024-03-24 DIAGNOSIS — S7291XA Unspecified fracture of right femur, initial encounter for closed fracture: Secondary | ICD-10-CM | POA: Diagnosis not present

## 2024-03-24 DIAGNOSIS — I10 Essential (primary) hypertension: Secondary | ICD-10-CM | POA: Diagnosis not present

## 2024-03-24 DIAGNOSIS — G20A1 Parkinson's disease without dyskinesia, without mention of fluctuations: Secondary | ICD-10-CM | POA: Diagnosis not present

## 2024-03-24 DIAGNOSIS — S7221XA Displaced subtrochanteric fracture of right femur, initial encounter for closed fracture: Secondary | ICD-10-CM

## 2024-03-24 HISTORY — PX: ORIF HIP FRACTURE: SHX2125

## 2024-03-24 LAB — CBC
HCT: 23.9 % — ABNORMAL LOW (ref 39.0–52.0)
Hemoglobin: 7.7 g/dL — ABNORMAL LOW (ref 13.0–17.0)
MCH: 34.2 pg — ABNORMAL HIGH (ref 26.0–34.0)
MCHC: 32.2 g/dL (ref 30.0–36.0)
MCV: 106.2 fL — ABNORMAL HIGH (ref 80.0–100.0)
Platelets: 217 10*3/uL (ref 150–400)
RBC: 2.25 MIL/uL — ABNORMAL LOW (ref 4.22–5.81)
RDW: 15.8 % — ABNORMAL HIGH (ref 11.5–15.5)
WBC: 5.8 10*3/uL (ref 4.0–10.5)
nRBC: 0 % (ref 0.0–0.2)

## 2024-03-24 LAB — SURGICAL PCR SCREEN
MRSA, PCR: NEGATIVE
Staphylococcus aureus: NEGATIVE

## 2024-03-24 LAB — BASIC METABOLIC PANEL WITH GFR
Anion gap: 5 (ref 5–15)
BUN: 32 mg/dL — ABNORMAL HIGH (ref 8–23)
CO2: 28 mmol/L (ref 22–32)
Calcium: 8.2 mg/dL — ABNORMAL LOW (ref 8.9–10.3)
Chloride: 105 mmol/L (ref 98–111)
Creatinine, Ser: 1.1 mg/dL (ref 0.61–1.24)
GFR, Estimated: >60 mL/min (ref >60)
Glucose, Bld: 125 mg/dL — ABNORMAL HIGH (ref 70–99)
Potassium: 4.7 mmol/L (ref 3.5–5.1)
Sodium: 138 mmol/L (ref 135–145)

## 2024-03-24 LAB — PREPARE RBC (CROSSMATCH)

## 2024-03-24 LAB — MAGNESIUM: Magnesium: 2.2 mg/dL (ref 1.7–2.4)

## 2024-03-24 SURGERY — OPEN REDUCTION INTERNAL FIXATION HIP
Anesthesia: General | Site: Hip | Laterality: Right

## 2024-03-24 MED ORDER — ORAL CARE MOUTH RINSE: 15.0000 mL | Freq: Once | OROMUCOSAL | Status: AC

## 2024-03-24 MED ORDER — TRANEXAMIC ACID-NACL 1000-0.7 MG/100ML-% IV SOLN
INTRAVENOUS | Status: AC
  Filled 2024-03-24: qty 100

## 2024-03-24 MED ORDER — CLOPIDOGREL BISULFATE 75 MG PO TABS: 75.0000 mg | ORAL_TABLET | Freq: Every day | ORAL | Status: DC

## 2024-03-24 MED ORDER — FENTANYL CITRATE (PF) 250 MCG/5ML IJ SOLN
INTRAMUSCULAR | Status: DC | PRN
  Administered 2024-03-24 (×3): 50 ug via INTRAVENOUS

## 2024-03-24 MED ORDER — ONDANSETRON HCL 4 MG/2ML IJ SOLN
INTRAMUSCULAR | Status: DC | PRN
  Administered 2024-03-24: 4 mg via INTRAVENOUS

## 2024-03-24 MED ORDER — TRANEXAMIC ACID-NACL 1000-0.7 MG/100ML-% IV SOLN
INTRAVENOUS | Status: DC | PRN
  Administered 2024-03-24: 1000 mg via INTRAVENOUS

## 2024-03-24 MED ORDER — CEFAZOLIN SODIUM-DEXTROSE 2-4 GM/100ML-% IV SOLN
2.0000 g | Freq: Three times a day (TID) | INTRAVENOUS | Status: AC
  Administered 2024-03-24 – 2024-03-25 (×2): 2 g via INTRAVENOUS
  Filled 2024-03-24 (×2): qty 100

## 2024-03-24 MED ORDER — 0.9 % SODIUM CHLORIDE (POUR BTL) OPTIME
TOPICAL | Status: DC | PRN
  Administered 2024-03-24: 1000 mL

## 2024-03-24 MED ORDER — PROPOFOL 10 MG/ML IV BOLUS
INTRAVENOUS | Status: DC | PRN
  Administered 2024-03-24 (×2): 50 mg via INTRAVENOUS

## 2024-03-24 MED ORDER — ADULT MULTIVITAMIN W/MINERALS CH
1.0000 | ORAL_TABLET | Freq: Every day | ORAL | Status: DC
  Administered 2024-03-25 – 2024-03-27 (×3): 1 via ORAL
  Filled 2024-03-24 (×4): qty 1

## 2024-03-24 MED ORDER — LACTATED RINGERS IV SOLN: INTRAVENOUS | Status: DC

## 2024-03-24 MED ORDER — LIDOCAINE 2% (20 MG/ML) 5 ML SYRINGE
INTRAMUSCULAR | Status: DC | PRN
  Administered 2024-03-24: 60 mg via INTRAVENOUS

## 2024-03-24 MED ORDER — CHLORHEXIDINE GLUCONATE 0.12 % MT SOLN
OROMUCOSAL | Status: AC
  Filled 2024-03-24: qty 15

## 2024-03-24 MED ORDER — ENSURE PLUS HIGH PROTEIN PO LIQD
237.0000 mL | Freq: Two times a day (BID) | ORAL | Status: DC
  Filled 2024-03-24: qty 237

## 2024-03-24 MED ORDER — FENTANYL CITRATE (PF) 250 MCG/5ML IJ SOLN
INTRAMUSCULAR | Status: AC
  Filled 2024-03-24: qty 5

## 2024-03-24 MED ORDER — LACTATED RINGERS IV SOLN: INTRAVENOUS | Status: DC | PRN

## 2024-03-24 MED ORDER — ACETAMINOPHEN 10 MG/ML IV SOLN
INTRAVENOUS | Status: DC | PRN
  Administered 2024-03-24: 1000 mg via INTRAVENOUS

## 2024-03-24 MED ORDER — PHENYLEPHRINE HCL-NACL 20-0.9 MG/250ML-% IV SOLN
INTRAVENOUS | Status: DC | PRN
  Administered 2024-03-24: 25 ug/min via INTRAVENOUS

## 2024-03-24 MED ORDER — FENTANYL CITRATE (PF) 100 MCG/2ML IJ SOLN: 25.0000 ug | INTRAMUSCULAR | Status: DC | PRN

## 2024-03-24 MED ORDER — ROCURONIUM BROMIDE 10 MG/ML (PF) SYRINGE
PREFILLED_SYRINGE | INTRAVENOUS | Status: DC | PRN
  Administered 2024-03-24: 50 mg via INTRAVENOUS

## 2024-03-24 MED ORDER — STERILE WATER FOR IRRIGATION IR SOLN
Status: DC | PRN
  Administered 2024-03-24: 1000 mL

## 2024-03-24 MED ORDER — PHENYLEPHRINE 80 MCG/ML (10ML) SYRINGE FOR IV PUSH (FOR BLOOD PRESSURE SUPPORT)
PREFILLED_SYRINGE | INTRAVENOUS | Status: DC | PRN
  Administered 2024-03-24 (×2): 80 ug via INTRAVENOUS
  Administered 2024-03-24: 160 ug via INTRAVENOUS

## 2024-03-24 MED ORDER — SODIUM CHLORIDE 0.9 % IV SOLN: INTRAVENOUS | Status: DC | PRN

## 2024-03-24 MED ORDER — ALBUMIN HUMAN 5 % IV SOLN: INTRAVENOUS | Status: DC | PRN

## 2024-03-24 MED ORDER — CEFAZOLIN SODIUM-DEXTROSE 2-4 GM/100ML-% IV SOLN
INTRAVENOUS | Status: AC
  Filled 2024-03-24: qty 100

## 2024-03-24 MED ORDER — SODIUM CHLORIDE 0.9% IV SOLUTION: Freq: Once | INTRAVENOUS | Status: DC

## 2024-03-24 MED ORDER — DEXAMETHASONE SODIUM PHOSPHATE 10 MG/ML IJ SOLN
INTRAMUSCULAR | Status: DC | PRN
  Administered 2024-03-24: 10 mg via INTRAVENOUS

## 2024-03-24 MED ORDER — CHLORHEXIDINE GLUCONATE 0.12 % MT SOLN
15.0000 mL | Freq: Once | OROMUCOSAL | Status: AC
  Administered 2024-03-24: 15 mL via OROMUCOSAL

## 2024-03-24 SURGICAL SUPPLY — 33 items
BAG COUNTER SPONGE SURGICOUNT (BAG) ×1 IMPLANT
BIT DRILL INTERTAN LAG SCREW (BIT) IMPLANT
BIT DRILL SHORT 4.0 (BIT) IMPLANT
CHLORAPREP W/TINT 26 (MISCELLANEOUS) ×1 IMPLANT
COVER PERINEAL POST (MISCELLANEOUS) ×1 IMPLANT
COVER SURGICAL LIGHT HANDLE (MISCELLANEOUS) ×1 IMPLANT
DERMABOND ADVANCED .7 DNX12 (GAUZE/BANDAGES/DRESSINGS) IMPLANT
DERMABOND ADVANCED .7 DNX6 (GAUZE/BANDAGES/DRESSINGS) ×1 IMPLANT
DRAPE C-ARM 42X72 X-RAY (DRAPES) ×1 IMPLANT
DRAPE C-ARMOR (DRAPES) ×1 IMPLANT
DRAPE STERI IOBAN 125X83 (DRAPES) ×1 IMPLANT
DRESSING MEPILEX FLEX 4X4 (GAUZE/BANDAGES/DRESSINGS) IMPLANT
ELECT REM PT RETURN 15FT ADLT (MISCELLANEOUS) IMPLANT
GLOVE BIO SURGEON STRL SZ 6.5 (GLOVE) ×1 IMPLANT
GLOVE BIOGEL PI IND STRL 6.5 (GLOVE) ×1 IMPLANT
GLOVE BIOGEL PI IND STRL 8 (GLOVE) ×1 IMPLANT
GLOVE SURG ORTHO 8.0 STRL STRW (GLOVE) ×2 IMPLANT
GOWN STRL REUS W/ TWL XL LVL3 (GOWN DISPOSABLE) ×3 IMPLANT
KIT BASIN OR (CUSTOM PROCEDURE TRAY) ×1 IMPLANT
KIT TURNOVER KIT B (KITS) IMPLANT
MANIFOLD NEPTUNE II (INSTRUMENTS) ×1 IMPLANT
NAIL LOCK CANN 10X420 130D RT (Nail) IMPLANT
NS IRRIG 1000ML POUR BTL (IV SOLUTION) ×1 IMPLANT
PACK GENERAL/GYN (CUSTOM PROCEDURE TRAY) ×1 IMPLANT
PAD ARMBOARD POSITIONER FOAM (MISCELLANEOUS) ×1 IMPLANT
PIN GUIDE 3.2X343MM (PIN) IMPLANT
ROD GUIDE 3.0 (MISCELLANEOUS) IMPLANT
SCREW LAG COMPR KIT 95/90 (Screw) IMPLANT
SCREW TRIGEN LOW PROF 5.0X45 (Screw) IMPLANT
SCREW TRIGEN LOW PROF 5.0X50 (Screw) IMPLANT
SUT MNCRL AB 3-0 PS2 18 (SUTURE) ×1 IMPLANT
SUT VIC AB 0 CT1 27XBRD ANBCTR (SUTURE) ×1 IMPLANT
SUT VIC AB 2-0 CT2 27 (SUTURE) ×1 IMPLANT

## 2024-03-24 NOTE — Interval H&P Note (Signed)
 The patient has been re-examined, and the chart reviewed, and there have been no interval changes to the documented history and physical.    Plan for R femur fracture ORIF with cephallomedullary nail  The operative side was examined and the patient was confirmed to have sensation to DPN, SPN, TN intact, Motor EHL, ext, flex 5/5, and DP 2+, PT 2+, No significant edema.   The risks, benefits, and alternatives have been discussed at length with patient, and the patient is willing to proceed.  Right hip marked. Consent has been signed.

## 2024-03-24 NOTE — Progress Notes (Addendum)
 Progress Note   Patient: Matthew Liu JXB:[id number] DOB: 1940-12-18 DOA: 03/23/2024  DOS: the patient was seen and examined on 03/24/2024   Brief hospital course:  83 y.o. male with medical history significant for CAD status post CABG, IPF, OSA, history of CVA, Parkinson disease with mild cognitive impairment, and left hip fracture status postsurgical repair on 02/19/2024 who now presents with severe right hip pain after a fall.   Assessment and Plan:  Acute right subtrochanteric femur fracture - Evaluated by orthopedic surgery, following closely.  Likely ORIF later this morning.  Currently in traction.  Pain control on board.  PT OT after procedure.  Macrocytic anemia - No active bleeding appreciated.  S/p 1 unit PRBC for preop.  CAD - Holding Plavix  for the time evaluation.  Continue on statin, BB.  Parkinson's disease with mild cognitive impairment - Sinemet  on board.  Obstructive sleep apnea - CPAP  Physical debilitation muscle weakness - Multiple falls at home, now with new fracture.  PT/OT on board.  Likely discharge to STR/SNF.    Subjective: Patient resting this morning, right lower extremity in traction.  Pain well-controlled as long as he does not move.  Denies any fever, shortness of breath, chest pain, nausea, vomiting, abdominal pain.  Physical Exam:  Vitals:   03/24/24 1041 03/24/24 1048 03/24/24 1158 03/24/24 1200  BP:  121/78 (!) 126/90 121/71  Pulse:  (!) 128 (!) 128 (!) 123  Resp:  14 14 18   Temp:  98.1 F (36.7 C) 97.8 F (36.6 C) 97.8 F (36.6 C)  TempSrc:  Oral Oral Oral  SpO2:  98% 98% 99%  Weight: 61.2 kg     Height: 5' 7 (1.702 m)       GENERAL:  Alert, pleasant, frail HEENT:  EOMI CARDIOVASCULAR:  RRR, no murmurs appreciated RESPIRATORY:  Clear to auscultation, no wheezing, rales, or rhonchi GASTROINTESTINAL:  Soft, nontender, nondistended EXTREMITIES: RLE in traction, externally rotated NEURO:  No new focal deficits appreciated SKIN:   No rashes noted PSYCH:  Appropriate mood and affect     Data Reviewed:  No new imaging to review  Previous records (including but not limited to H&P, progress notes, nursing notes, TOC management) were reviewed in assessment of this patient.  Labs: CBC: Recent Labs  Lab 03/23/24 1742 03/24/24 0850  WBC 6.8 5.8  NEUTROABS 4.7  --   HGB 8.0* 7.7*  HCT 25.2* 23.9*  MCV 107.7* 106.2*  PLT 215 217   Basic Metabolic Panel: Recent Labs  Lab 03/23/24 1742 03/24/24 0850  NA 137 138  K 4.3 4.7  CL 103 105  CO2 27 28  GLUCOSE 149* 125*  BUN 36* 32*  CREATININE 1.36* 1.10  CALCIUM  8.2* 8.2*  MG  --  2.2   Liver Function Tests: No results for input(s): AST, ALT, ALKPHOS, BILITOT, PROT, ALBUMIN  in the last 168 hours. CBG: No results for input(s): GLUCAP in the last 168 hours.  Scheduled Meds:  sodium chloride    Intravenous Once   [MAR Hold] atorvastatin   80 mg Oral QPM   [MAR Hold] carbidopa -levodopa   1 tablet Oral TID   [MAR Hold] carvedilol   3.125 mg Oral BID WC   feeding supplement  237 mL Oral BID BM   [START ON 03/25/2024] multivitamin with minerals  1 tablet Oral Daily   [MAR Hold] pantoprazole   40 mg Oral Daily   Continuous Infusions:  lactated ringers  10 mL/hr at 03/24/24 1059   PRN Meds:.0.9 % irrigation (POUR BTL), [facility]  Hold] fentaNYL  (SUBLIMAZE ) injection, fentaNYL  (SUBLIMAZE ) injection, [MAR Hold] methocarbamol , [MAR Hold] oxyCODONE , [MAR Hold] polyethylene glycol, sterile water, [MAR Hold] trimethobenzamide  Family Communication: None at bedside  Disposition: Status is: Inpatient Remains inpatient appropriate because: Right hip fracture     Time spent: 34 minutes  Length of inpatient stay: 1 days  Author: Jodeane Mulligan, DO 03/24/2024 1:17 PM  For on call review [email or web address].

## 2024-03-24 NOTE — Op Note (Signed)
 DATE OF SURGERY:  03/24/2024  TIME: 2:08 PM  PATIENT NAME:  Matthew Liu  AGE: 83 y.o.  PRE-OPERATIVE DIAGNOSIS: Right subtrochanteric femur fracture  POST-OPERATIVE DIAGNOSIS:  SAME  PROCEDURE: Open reduction term fixation right subtrochanteric femur fracture with Seph medullary nail  SURGEON:  Topher Buenaventura A Oveta Idris  ASSISTANT: Mason Sole, PA-C was present and scrubbed throughout the case, critical for assistance with exposure, retraction, instrumentation, and closure.  OPERATIVE IMPLANTS:  Felipe [employer] Intertan Nail 10 x 420 mm , 95/90 lag compression screw, 2 distal interlocks  Implant Name Type Inv. Item Serial No. Manufacturer Lot No. LRB No. Used Action  NAIL LOCK CANN 10X420 130D RT - [id number] Nail NAIL LOCK CANN 10X420 130D RT  [employer] [id number] Right 1 Implanted  SCREW LAG COMPR KIT 95/90 - [id number] Screw SCREW LAG COMPR KIT 95/90  [employer] [id number] Right 1 Implanted  SCREW TRIGEN LOW PROF 5.0X45 - [id number] Screw SCREW TRIGEN LOW PROF 5.0X45  [employer] [id number] Right 1 Implanted  SCREW TRIGEN LOW PROF 5.0X50 - [id number] Screw SCREW TRIGEN LOW PROF 5.0X50  [employer] [id number] Right 1 Implanted    UNIQUE ASPECTS OF THE CASE: Highly comminuted subtrochanteric femur fracture, reduced using finger reduction tool, relatively posterior screw placement however no cortical penetration confirmed on fluoroscopy as well as felt during screw placement and drilling.  ESTIMATED BLOOD LOSS: 150cc  PREOPERATIVE INDICATIONS:  OLVIN Liu is a 83 y.o. year old who fell and suffered an RIGHT SUBTROCH FEMUR FX. He was brought into the ER and then admitted and optimized and then elected for surgical intervention.    The risks benefits and alternatives were discussed with the patient including but not limited to the risks of nonoperative treatment, versus surgical intervention including infection,  bleeding, nerve injury, malunion, nonunion, hardware prominence, hardware failure, need for hardware removal, blood clots, cardiopulmonary complications, morbidity, mortality, among others, and they were willing to proceed.    OPERATIVE PROCEDURE:  The patient was brought to the operating room and placed in the supine position. Anesthesia was administered. He was placed on the fracture table.  Closed reduction was performed under C-arm guidance.  Time out was then performed after sterile prep and drape. He received preoperative antibiotics.  Small incision proximal to the greater trochanter was made and carried down through skin and subcutaneous tissue.  Threaded guidewire was directed at the tip of the greater trochanter and advanced into the proximal metaphysis.  Positioning was confirmed with fluoroscopy.  I then used an entry reamer to enter the medullary canal.  I then used the finger reduction tool to pass from the proximal segment through the comminuted subtroch segment into the distal femur.  The flexible guidewire was passed through the finger reduction tool.  Based on cortical thicknesses felt that we had appropriate reduction of the fracture in terms of rotation and length.  I then sequentially reamed the canal till I achieved appropriate chatter.  I reamed up to 11.5 mm for a 10 mm nail.  The nail length was measured.    I then passed a 10 x 420 mm InterTAN down the center of the canal attached to the targeting arm.  I then used the targeting arm to make a percutaneous incision and directed a threaded guidewire up into the head/neck segment.  I confirmed adequate tip apex distance and measured the length.  I decided to place a 95/90 mm screw.  I then  drilled the path for the compression screw and placed an antirotation bar.  I then placed the lag screw and then placed the compression screw, but did not add any additional compression as the fracture was subtrochanteric and the screws were  essentially proximal interlocks..  The proximal portion of the nail was statically locked.   The targeting arm was removed, and given the amount of comminution elected to place 2 distal interlocks using perfect circle technique.  Final fluoroscopic imaging was obtained.    The wounds were irrigated copiously, and vancomycin  powder was placed in the wounds.  The gluteal fascia was closed with 0 Vicryl, and skin was closed with 2-0 Vicryl and 3-0 Monocryl.  Sterile dressing was applied with Dermabond, and Mepilex dressing.  The patient was awakened and returned to PACU in stable and satisfactory condition. There were no complications and the patient tolerated the procedure well.   Post op recs: WB: WBAT RLE Abx: ancef  x23 hours post op Imaging: PACU xrays Dressing: keep intact until follow up, change PRN if soiled or saturated. DVT prophylaxis: Resume Plavix  postop day 1 Follow up: 2 weeks after surgery for a wound check with Dr. Pryor Browning at [facility].  Address: [address], [place], [place] [address]  Office Phone: [phone]  Priscille Brought, MD Orthopaedic Surgery

## 2024-03-24 NOTE — Progress Notes (Signed)
 Initial Nutrition Assessment  DOCUMENTATION CODES:   Not applicable  INTERVENTION:   -Once diet is advanced, add:   -MVI with minerals daily -Ensure Plus High Protein po BID, each supplement provides 350 kcal and 20 grams of protein   NUTRITION DIAGNOSIS:   Increased nutrient needs related to post-op healing as evidenced by estimated needs.  GOAL:   Patient will meet greater than or equal to 90% of their needs  MONITOR:   Supplement acceptance, PO intake, Diet advancement  REASON FOR ASSESSMENT:   Consult Assessment of nutrition requirement/status  ASSESSMENT:   wPt ith medical history significant for CAD status post CABG, IPF, OSA, history of CVA, Parkinson disease with mild cognitive impairment, and left hip fracture status postsurgical repair on 02/19/2024 who now presents with severe right hip pain after a fall.  Pt admitted with rt hip fracture.   Pt unavailable at time of visit. Attempted to speak with pt via call to hospital room phone, however, unable to reach. RD unable to obtain further nutrition-related history or complete nutrition-focused physical exam at this time.    Noted pt experienced lt hip fracture s/p surgery approximately 1 months ago. Pt was recovering well from initial surgery PTA, ambulating at home with a walker per family. Pt NPO for surgery today; was in [facility]' traction.  Reviewed wt hx; pt experienced a 12.4% wt loss over the past year. While this is not significant for time frame, it is concerning given recent surgery, advanced age, and multiple co-morbidities. Per nursing assessment, pt with edema, which may be masking true weight loss as well as fat and muscle depletions.  Pt with increased nutritional needs for post-op healing and would benefit from addition of oral nutrition supplements. Per prior notes, pt has drank supplements in the past, but is inconsistent with them.   Medications reviewed and include sinemet , protonix , and lactated  ringers  infusion @ 10 ml/hr.   Labs reviewed: CBGS: 120 (inpatient orders for glycemic control are none).    Diet Order:   Diet Order             Diet NPO time specified  Diet effective ____                   EDUCATION NEEDS:   No education needs have been identified at this time  Skin:  Skin Assessment: Reviewed RN Assessment  Last BM:  03/23/24  Height:   Ht Readings from Last 1 Encounters:  03/24/24 5' 7 (1.702 m)    Weight:   Wt Readings from Last 1 Encounters:  03/24/24 61.2 kg    Ideal Body Weight:  67.3 kg  BMI:  Body mass index is 21.13 kg/m.  Estimated Nutritional Needs:   Kcal:  1800-2000  Protein:  90-105 grams  Fluid:  1.8-2.0 L    Herschel Lords, RD, LDN, CDCES Registered Dietitian III Certified Diabetes Care and Education Specialist If unable to reach this RD, please use RD Inpatient group chat on secure chat between hours of 8am-4 pm daily

## 2024-03-24 NOTE — Progress Notes (Signed)
 Patient returned from surgery and HR going into Afib/Aflutter with rate hitting up to 120. Patient asymptomatic. MD Macarthur Savory made aware. Oral coreg  administered.

## 2024-03-24 NOTE — Transfer of Care (Signed)
 Immediate Anesthesia Transfer of Care Note  Patient: Matthew Liu  Procedure(s) Performed: OPEN REDUCTION INTERNAL FIXATION HIP (Right: Hip)  Patient Location: PACU  Anesthesia Type:General  Level of Consciousness: awake, alert , and patient cooperative  Airway & Oxygen Therapy: Patient Spontanous Breathing and Patient connected to face mask oxygen  Post-op Assessment: Report given to RN, Post -op Vital signs reviewed and stable, Patient moving all extremities, Patient moving all extremities X 4, and Patient able to stick tongue midline  Post vital signs: Reviewed and stable  Last Vitals:  Vitals Value Taken Time  BP    Temp    Pulse 65 03/24/24 14:31  Resp 20 03/24/24 14:32  SpO2 100 % 03/24/24 14:31  Vitals shown include unfiled device data.  Last Pain:  Vitals:   03/24/24 1200  TempSrc: Oral  PainSc:          Complications: No notable events documented.

## 2024-03-24 NOTE — Anesthesia Procedure Notes (Signed)
 Procedure Name: Intubation Date/Time: 03/24/2024 12:40 PM  Performed by: Hebert Littler, CRNAPre-anesthesia Checklist: Patient identified, Emergency Drugs available, Suction available, Patient being monitored and Timeout performed Patient Re-evaluated:Patient Re-evaluated prior to induction Oxygen Delivery Method: Circle system utilized Preoxygenation: Pre-oxygenation with 100% oxygen Induction Type: IV induction Ventilation: Mask ventilation without difficulty and Oral airway inserted - appropriate to patient size Laryngoscope Size: Mac and 3 Grade View: Grade I Tube type: Oral Tube size: 7.0 mm Number of attempts: 1 Airway Equipment and Method: Patient positioned with wedge pillow and Stylet Placement Confirmation: ETT inserted through vocal cords under direct vision, positive ETCO2, breath sounds checked- equal and bilateral and CO2 detector Secured at: 22 cm Tube secured with: Tape

## 2024-03-24 NOTE — Anesthesia Preprocedure Evaluation (Addendum)
 Anesthesia Evaluation  Patient identified by MRN, date of birth, ID band Patient awake    Reviewed: Allergy & Precautions, H&P , NPO status , Patient's Chart, lab work & pertinent test results  Airway Mallampati: III  TM Distance: >3 FB Neck ROM: Full    Dental no notable dental hx. (+) Teeth Intact, Dental Advisory Given   Pulmonary sleep apnea    Pulmonary exam normal breath sounds clear to auscultation       Cardiovascular hypertension, Pt. on medications and Pt. on home beta blockers + CAD, + Past MI, + Cardiac Stents, + CABG and + Peripheral Vascular Disease   Rhythm:Regular Rate:Tachycardia     Neuro/Psych   Anxiety      Neuromuscular disease    GI/Hepatic Neg liver ROS, hiatal hernia,GERD  Medicated,,  Endo/Other  negative endocrine ROS    Renal/GU negative Renal ROS  negative genitourinary   Musculoskeletal   Abdominal   Peds  Hematology  (+) Blood dyscrasia, anemia   Anesthesia Other Findings   Reproductive/Obstetrics negative OB ROS                             Anesthesia Physical Anesthesia Plan  ASA: 3  Anesthesia Plan: General   Post-op Pain Management: Ofirmev  IV (intra-op)*   Induction: Intravenous  PONV Risk Score and Plan: 3 and Ondansetron , Dexamethasone and Treatment may vary due to age or medical condition  Airway Management Planned: Oral ETT  Additional Equipment:   Intra-op Plan:   Post-operative Plan: Extubation in OR  Informed Consent: I have reviewed the patients History and Physical, chart, labs and discussed the procedure including the risks, benefits and alternatives for the proposed anesthesia with the patient or authorized representative who has indicated his/her understanding and acceptance.     Dental advisory given  Plan Discussed with: CRNA  Anesthesia Plan Comments:        Anesthesia Quick Evaluation

## 2024-03-24 NOTE — Hospital Course (Addendum)
 83 y.o. male with medical history significant for CAD status post CABG, IPF, OSA, history of CVA, Parkinson disease with mild cognitive impairment, and left hip fracture status postsurgical repair on 02/19/2024 who now presents with severe right hip pain after a fall.    Assessment and Plan:   Acute right subtrochanteric femur fracture - Evaluated by orthopedic surgery, following closely.  ORIF 6/15.  Pain control on board.  PT OT recommending STR.  WBAT.  Plavix  resumed 6/16.  Follow up with ortho in 2wks.     Macrocytic anemia - No active bleeding appreciated.  Hemoglobin stable.  S/p 1 unit PRBC prior to surgery.   CAD - Resume Plavix .  Continue on statin, BB.   Parkinson's disease with mild cognitive impairment - Sinemet  on board.   Obstructive sleep apnea - CPAP   Physical debilitation muscle weakness - Multiple falls at home, now with new fracture.  PT/OT on board.  Likely discharge to STR/SNF.

## 2024-03-24 NOTE — Plan of Care (Signed)

## 2024-03-24 NOTE — Discharge Instructions (Addendum)
 Orthopedic Discharge Instructions  Diet: As you were doing prior to hospitalization   Shower:  May shower but keep the wounds dry, use an occlusive plastic wrap, NO SOAKING IN TUB.  If the bandage gets wet, change with a clean dry gauze.  If you have a splint on, leave the splint in place and keep the splint dry with a plastic bag.  Dressing:  You may change your dressing 3-5 days after surgery.  If the dressing remains clean and dry it can also be left on until follow up.  If you change the dressing replace with clean gauze and tape or ace wrap.  For most surgeries, the stitches used are dissolvable and don't need to be removed.  However, depending on your surgery, you may have stitches that will need to be removed in the office in about 2 weeks.    Activity:  Increase activity slowly as tolerated, but follow the weight bearing instructions below.  Do not drive for the next 4-6 weeks.  In addition, you cannot be taking narcotics while you drive, and you must feel in control of the vehicle.    Weight Bearing:   You may bear weight on your surgical leg as tolerated.    Blood clot prevention (DVT Prophylaxis): After surgery you are at an increased risk for a blood clot. You are to resume your Plavix  for a total of 4 weeks from surgery to help reduce your risk of getting a blood clot.   Then return to taking the Plavix  as your regular provider prescribed.  Signs of a pulmonary embolus (blood clot in the lungs) include sudden short of breath, feeling lightheaded or dizzy, chest pain with a deep breath, rapid pulse rapid breathing.  Signs of a blood clot in your arms or legs include new unexplained swelling and cramping, warm, red or darkened skin around the painful area.  Please call the office or 911 right away if these signs or symptoms develop.  To prevent constipation: you may use a stool softener such as - Colace (over the counter) 100 mg by mouth twice a day  Drink plenty of fluids (prune juice may  be helpful) and high fiber foods Miralax  (over the counter) for constipation as needed.    Itching:  If you experience itching with your medications, try taking only a single pain pill, or even half a pain pill at a time.  You may take up to 10 pain pills per day, and you can also use benadryl  over the counter for itching or also to help with sleep.   Precautions:  If you experience chest pain or shortness of breath - call 911 immediately for transfer to the hospital emergency department!!  Call office [phone]) for the following: Temperature greater than 101F Persistent nausea and vomiting Severe uncontrolled pain Redness, tenderness, or signs of infection (pain, swelling, redness, odor or green/yellow discharge around the site) Difficulty breathing, headache or visual disturbances Hives Persistent dizziness or light-headedness Extreme fatigue Any other questions or concerns you may have after discharge  In an emergency, call 911 or go to an Emergency Department at a nearby hospital  Follow- Up Appointment:  Please call for an appointment to be seen approximately 2-3 week after surgery in [facility] with your surgeon Dr. Priscille Brought - [phone] Address: [address], [place], [place] [address]

## 2024-03-24 NOTE — Anesthesia Postprocedure Evaluation (Signed)
 Anesthesia Post Note  Patient: Matthew Liu  Procedure(s) Performed: OPEN REDUCTION INTERNAL FIXATION HIP (Right: Hip)     Patient location during evaluation: PACU Anesthesia Type: General Level of consciousness: awake and alert Pain management: pain level controlled Vital Signs Assessment: post-procedure vital signs reviewed and stable Respiratory status: spontaneous breathing, nonlabored ventilation and respiratory function stable Cardiovascular status: blood pressure returned to baseline and stable Postop Assessment: no apparent nausea or vomiting Anesthetic complications: no  No notable events documented.  Last Vitals:  Vitals:   03/24/24 1434 03/24/24 1445  BP: (!) 159/68 103/79  Pulse: 61 62  Resp: 16 19  Temp:    SpO2: 100% 100%    Last Pain:  Vitals:   03/24/24 1430  TempSrc:   PainSc: 0-No pain                 Mikai Meints,W. EDMOND

## 2024-03-25 ENCOUNTER — Encounter (HOSPITAL_COMMUNITY): Payer: Self-pay | Admitting: Orthopedic Surgery

## 2024-03-25 DIAGNOSIS — I2510 Atherosclerotic heart disease of native coronary artery without angina pectoris: Secondary | ICD-10-CM | POA: Diagnosis not present

## 2024-03-25 DIAGNOSIS — G20A1 Parkinson's disease without dyskinesia, without mention of fluctuations: Secondary | ICD-10-CM | POA: Diagnosis not present

## 2024-03-25 DIAGNOSIS — S7291XA Unspecified fracture of right femur, initial encounter for closed fracture: Secondary | ICD-10-CM | POA: Diagnosis not present

## 2024-03-25 DIAGNOSIS — D539 Nutritional anemia, unspecified: Secondary | ICD-10-CM | POA: Diagnosis not present

## 2024-03-25 LAB — TYPE AND SCREEN
ABO/RH(D): O POS
Antibody Screen: NEGATIVE
Unit division: 0
Unit division: 0

## 2024-03-25 LAB — BASIC METABOLIC PANEL WITH GFR
Anion gap: 9 (ref 5–15)
BUN: 36 mg/dL — ABNORMAL HIGH (ref 8–23)
CO2: 24 mmol/L (ref 22–32)
Calcium: 7.7 mg/dL — ABNORMAL LOW (ref 8.9–10.3)
Chloride: 101 mmol/L (ref 98–111)
Creatinine, Ser: 1.3 mg/dL — ABNORMAL HIGH (ref 0.61–1.24)
GFR, Estimated: 54 mL/min — ABNORMAL LOW (ref >60)
Glucose, Bld: 150 mg/dL — ABNORMAL HIGH (ref 70–99)
Potassium: 4.1 mmol/L (ref 3.5–5.1)
Sodium: 134 mmol/L — ABNORMAL LOW (ref 135–145)

## 2024-03-25 LAB — CBC
HCT: 27.7 % — ABNORMAL LOW (ref 39.0–52.0)
Hemoglobin: 9.4 g/dL — ABNORMAL LOW (ref 13.0–17.0)
MCH: 33.2 pg (ref 26.0–34.0)
MCHC: 33.9 g/dL (ref 30.0–36.0)
MCV: 97.9 fL (ref 80.0–100.0)
Platelets: 207 10*3/uL (ref 150–400)
RBC: 2.83 MIL/uL — ABNORMAL LOW (ref 4.22–5.81)
RDW: 19.1 % — ABNORMAL HIGH (ref 11.5–15.5)
WBC: 7.9 10*3/uL (ref 4.0–10.5)
nRBC: 0 % (ref 0.0–0.2)

## 2024-03-25 LAB — BPAM RBC
Blood Product Expiration Date: 202507112359
Blood Product Expiration Date: 202507112359
ISSUE DATE / TIME: 202506151143
ISSUE DATE / TIME: 202506151143
Unit Type and Rh: 5100
Unit Type and Rh: 202507112359
Unit Type and Rh: 5100

## 2024-03-25 LAB — MAGNESIUM: Magnesium: 2.2 mg/dL (ref 1.7–2.4)

## 2024-03-25 MED ORDER — CLOPIDOGREL BISULFATE 75 MG PO TABS
75.0000 mg | ORAL_TABLET | Freq: Every day | ORAL | Status: DC
  Administered 2024-03-25 – 2024-03-27 (×3): 75 mg via ORAL
  Filled 2024-03-25 (×3): qty 1

## 2024-03-25 NOTE — Evaluation (Signed)
 Physical Therapy Evaluation Patient Details Name: Matthew Liu MRN: [id number] DOB: 1940/12/01 Today's Date: 03/25/2024  History of Present Illness  83 y.o. male who now presents with severe right hip pain after a fall, and ORIF 6/15; WBAT; with medical history significant for CAD status post CABG, IPF, OSA, history of CVA, Parkinson disease with mild cognitive impairment, and left hip fracture status postsurgical repair on 02/19/2024  Clinical Impression   Pt admitted with above diagnosis. Lives at home with wife, he is the primary caregiver for his wife who has dementia (they have some private pay help); Prior to admission, pt was able to walk inhis home without an assistive device; had been done with HHPT for about 2 weeks before this admission; Presents to PT with generalized weakness, gait and balance dysfunction, and now with difficulty with his good leg;  Pt moves slowly, but able to come to stand, and walk up to 30 ft with RW and min assist; Still, he must be at or very near modified independent in order to dc home; recommend post-acute rehab stay to maximize independence and safety with mobility and ADLs; Patient will benefit from continued inpatient follow up therapy, <3 hours/day; Pt currently with functional limitations due to the deficits listed below (see PT Problem List). Pt will benefit from skilled PT to increase their independence and safety with mobility to allow discharge to the venue listed below.           If plan is discharge home, recommend the following: A little help with walking and/or transfers;Assistance with cooking/housework;Assist for transportation;Help with stairs or ramp for entrance   Can travel by private vehicle   No    Equipment Recommendations None recommended by PT (I believe he is well equipped)  Recommendations for Other Services  OT consult (Will consider OT consult)    Functional Status Assessment Patient has had a recent decline in their  functional status and demonstrates the ability to make significant improvements in function in a reasonable and predictable amount of time.     Precautions / Restrictions Precautions Precautions: Fall Recall of Precautions/Restrictions: Intact Restrictions RLE Weight Bearing Per Provider Order: Weight bearing as tolerated      Mobility  Bed Mobility Overal bed mobility: Needs Assistance Bed Mobility: Supine to Sit     Supine to sit: Mod assist     General bed mobility comments: Heavy mod assist to scoot hips to EOB and assist RLE in sliding across bed; moved in small increments to EOB and pull to sit; incr time    Transfers Overall transfer level: Needs assistance Equipment used: Rolling walker (2 wheels) Transfers: Sit to/from Stand Sit to Stand: Min assist           General transfer comment: stood from elevated bed with light min assist and smooth rise; stood for lwer recliner weat with good use of arm rests and light min assist to steady    Ambulation/Gait Ambulation/Gait assistance: Min assist Gait Distance (Feet): 45 Feet (15+30) Assistive device: Rolling walker (2 wheels) Gait Pattern/deviations: Step-through pattern, Trunk flexed       General Gait Details: Cues for sequence and to self-monitor for acitivy tolerance; 2 bouts of amb  Stairs            Wheelchair Mobility     Tilt Bed    Modified Rankin (Stroke Patients Only)       Balance Overall balance assessment: Needs assistance   Sitting balance-Leahy Scale: Fair  Standing balance-Leahy Scale: Poor                               Pertinent Vitals/Pain Pain Assessment Pain Assessment: Faces Faces Pain Scale: Hurts little more Pain Location: R hip Pain Descriptors / Indicators: Grimacing, Guarding Pain Intervention(s): Premedicated before session    Home Living Family/patient expects to be discharged to:: Private residence Living Arrangements:  Spouse/significant other Available Help at Discharge: Family;Available PRN/intermittently Type of Home: House Home Access: Ramped entrance       Home Layout: One level Home Equipment: Agricultural consultant (2 wheels);Cane - single point;Wheelchair - manual      Prior Function Prior Level of Function : Independent/Modified Independent;Driving (prior to fall and fx in mid-May of this year)             Mobility Comments: ambulating independently, primary caregiver for his spouse who has dementia       Extremity/Trunk Assessment   Upper Extremity Assessment Upper Extremity Assessment: Generalized weakness    Lower Extremity Assessment Lower Extremity Assessment: Generalized weakness    Cervical / Trunk Assessment Cervical / Trunk Assessment: Kyphotic  Communication   Communication Communication: No apparent difficulties;Impaired Factors Affecting Communication: Hearing impaired    Cognition Arousal: Alert Behavior During Therapy: WFL for tasks assessed/performed   PT - Cognitive impairments: No apparent impairments                         Following commands: Intact       Cueing Cueing Techniques: Verbal cues, Gestural cues, Tactile cues, Visual cues     General Comments General comments (skin integrity, edema, etc.): Pt engaged iin session and motivated to improve wants to get home to wife    Exercises Total Joint Exercises Ankle Circles/Pumps: AROM, Both, 5 reps Quad Sets: AROM, Both, 10 reps Short Arc Quad: AROM, Right, 5 reps   Assessment/Plan    PT Assessment Patient needs continued PT services  PT Problem List Decreased strength;Decreased range of motion;Decreased activity tolerance;Decreased balance;Decreased coordination;Decreased mobility;Decreased knowledge of use of DME;Decreased safety awareness;Decreased knowledge of precautions;Cardiopulmonary status limiting activity;Pain       PT Treatment Interventions DME instruction;Gait  training;Stair training;Functional mobility training;Therapeutic activities;Therapeutic exercise;Balance training;Neuromuscular re-education;Cognitive remediation;Patient/family education;Wheelchair mobility training    PT Goals (Current goals can be found in the Care Plan section)  Acute Rehab PT Goals Patient Stated Goal: hopes to get home to wife soon PT Goal Formulation: With patient Time For Goal Achievement: 04/08/24 Potential to Achieve Goals: Good    Frequency Min 2X/week     Co-evaluation               AM-PAC PT 6 Clicks Mobility  Outcome Measure Help needed turning from your back to your side while in a flat bed without using bedrails?: A Lot Help needed moving from lying on your back to sitting on the side of a flat bed without using bedrails?: A Lot Help needed moving to and from a bed to a chair (including a wheelchair)?: A Lot Help needed standing up from a chair using your arms (e.g., wheelchair or bedside chair)?: A Little Help needed to walk in hospital room?: A Little Help needed climbing 3-5 steps with a railing? : A Lot 6 Click Score: 14    End of Session Equipment Utilized During Treatment: Gait belt Activity Tolerance: Patient tolerated treatment well Patient left: in chair;with call bell/phone  within reach;with chair alarm set Nurse Communication: Mobility status PT Visit Diagnosis: Unsteadiness on feet (R26.81);Other abnormalities of gait and mobility (R26.89);History of falling (Z91.81);Pain Pain - Right/Left: Right Pain - part of body: Hip    Time: 1215-1307 PT Time Calculation (min) (ACUTE ONLY): 52 min   Charges:   PT Evaluation $PT Eval Moderate Complexity: 1 Mod PT Treatments $Gait Training: 8-22 mins $Therapeutic Activity: 8-22 mins PT General Charges $$ ACUTE PT VISIT: 1 Visit         Darcus Eastern, PT  Acute Rehabilitation Services Office [phone] Secure Chat welcomed   [name] 03/25/2024, 2:13 PM

## 2024-03-25 NOTE — Progress Notes (Signed)
 Progress Note   Patient: Matthew Liu MVH:[id number] DOB: 19-Mar-1941 DOA: 03/23/2024  DOS: the patient was seen and examined on 03/25/2024   Brief hospital course:  83 y.o. male with medical history significant for CAD status post CABG, IPF, OSA, history of CVA, Parkinson disease with mild cognitive impairment, and left hip fracture status postsurgical repair on 02/19/2024 who now presents with severe right hip pain after a fall.    Assessment and Plan:   Acute right subtrochanteric femur fracture - Evaluated by orthopedic surgery, following closely.  ORIF 6/15.  Pain control on board.  PT OT pending.  WBAT.  Resume plavix  today.  Follow up with ortho in 2wks.     Macrocytic anemia - No active bleeding appreciated.  S/p 1 unit PRBC.   CAD - Resume Plavix .  Continue on statin, BB.   Parkinson's disease with mild cognitive impairment - Sinemet  on board.   Obstructive sleep apnea - CPAP   Physical debilitation muscle weakness - Multiple falls at home, now with new fracture.  PT/OT on board.  Likely discharge to STR/SNF.   Subjective: Patient resting comfortably this morning.  States he feels better than yesterday.  Eager to attempt to move with PT.  Denies any fever, chills, chest pain, nausea, vomiting, abdominal pain.  Physical Exam:  Vitals:   03/24/24 1549 03/24/24 2000 03/25/24 0342 03/25/24 0840  BP: (!) 135/93 (!) 144/64 119/83 127/84  Pulse: 83 81 (!) 115   Resp: 16   15  Temp: 97.6 F (36.4 C) 98.5 F (36.9 C) 97.8 F (36.6 C) 98.2 F (36.8 C)  TempSrc: Oral Oral Oral   SpO2: 93% 99% 100% 95%  Weight:      Height:        GENERAL:  Alert, pleasant, no acute distress  HEENT:  EOMI CARDIOVASCULAR:  RRR, no murmurs appreciated RESPIRATORY:  Clear to auscultation, no wheezing, rales, or rhonchi GASTROINTESTINAL:  Soft, nontender, nondistended EXTREMITIES: Frail no LE edema bilaterally NEURO:  No new focal deficits appreciated SKIN: Bandage over right  hip PSYCH:  Appropriate mood and affect     Data Reviewed:  No new imaging to review  Previous records (including but not limited to H&P, progress notes, nursing notes, TOC management) were reviewed in assessment of this patient.  Labs: CBC: Recent Labs  Lab 03/23/24 1742 03/24/24 0850 03/25/24 0619  WBC 6.8 5.8 7.9  NEUTROABS 4.7  --   --   HGB 8.0* 7.7* 9.4*  HCT 25.2* 23.9* 27.7*  MCV 107.7* 106.2* 97.9  PLT 215 217 207   Basic Metabolic Panel: Recent Labs  Lab 03/23/24 1742 03/24/24 0850 03/25/24 0619  NA 137 138 134*  K 4.3 4.7 4.1  CL 103 105 101  CO2 27 28 24   GLUCOSE 149* 125* 150*  BUN 36* 32* 36*  CREATININE 1.36* 1.10 1.30*  CALCIUM  8.2* 8.2* 7.7*  MG  --  2.2 2.2   Liver Function Tests: No results for input(s): AST, ALT, ALKPHOS, BILITOT, PROT, ALBUMIN  in the last 168 hours. CBG: No results for input(s): GLUCAP in the last 168 hours.  Scheduled Meds:  atorvastatin   80 mg Oral QPM   carbidopa -levodopa   1 tablet Oral TID   carvedilol   3.125 mg Oral BID WC   feeding supplement  237 mL Oral BID BM   multivitamin with minerals  1 tablet Oral Daily   pantoprazole   40 mg Oral Daily   Continuous Infusions: PRN Meds:.fentaNYL  (SUBLIMAZE ) injection, methocarbamol , oxyCODONE , polyethylene glycol,  trimethobenzamide  Family Communication: None at bedside  Disposition: Status is: Inpatient Remains inpatient appropriate because: Right hip fracture, disposition     Time spent: 35 minutes  Length of inpatient stay: 2 days  Author: Jodeane Mulligan, DO 03/25/2024 11:33 AM  For on call review [email or web address].

## 2024-03-25 NOTE — Progress Notes (Signed)
     Subjective:  Patient reports pain as mild.  Sitting comfortably in bed this morning.  Denies distal numbness and tingling.  Plan for mobilization today with physical therapy.  Discussed intraoperative findings.  Spoke with overnight nurse who reports concerns for possible A-fib a flutter overnight.  Objective:   VITALS:   Vitals:   03/24/24 1530 03/24/24 1549 03/24/24 2000 03/25/24 0342  BP: (!) 141/86 (!) 135/93 (!) 144/64 119/83  Pulse: 60 83 81 (!) 115  Resp: 15 16    Temp: 97.6 F (36.4 C) 97.6 F (36.4 C) 98.5 F (36.9 C) 97.8 F (36.6 C)  TempSrc:  Oral Oral Oral  SpO2: 100% 93% 99% 100%  Weight:      Height:        Sensation intact distally Intact pulses distally Dorsiflexion/Plantar flexion intact Incision: dressing C/D/I Compartment soft    Lab Results  Component Value Date   WBC 7.9 03/25/2024   HGB 9.4 (L) 03/25/2024   HCT 27.7 (L) 03/25/2024   MCV 97.9 03/25/2024   PLT 207 03/25/2024   BMET    Component Value Date/Time   NA 134 (L) 03/25/2024 0619   NA 138 09/02/2020 0844   K 4.1 03/25/2024 0619   CL 101 03/25/2024 0619   CO2 24 03/25/2024 0619   GLUCOSE 150 (H) 03/25/2024 0619   BUN 36 (H) 03/25/2024 0619   BUN 20 09/02/2020 0844   CREATININE 1.30 (H) 03/25/2024 0619   CALCIUM  7.7 (L) 03/25/2024 0619   GFRNONAA 54 (L) 03/25/2024 0454      Xray: PACU x-rays demonstrate interval fixation of subtrochanteric femur fracture with Seph medullary nail.  No adverse features.  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 DVT prophylaxis: Resume Plavix  postop day 1 Follow up: 2 weeks after surgery for a wound check with Dr. Pryor Browning at [facility].  Address: [address], [place], [place] [address]  Office Phone: [phone]  Murleen Arms 03/25/2024, 7:38 AM   Priscille Brought, MD  Contact information:   [phone] 7am-5pm epic message Dr. Pryor Browning, or call office for patient follow up: [phone] After hours and holidays please check [email or web address] for group call information for [facility]

## 2024-03-25 NOTE — Progress Notes (Signed)
 Patient says he used to wear a CPAP but no longer wears or wants to wear.  03/25/24 2033  BiPAP/CPAP/SIPAP  Reason BIPAP/CPAP not in use Non-compliant

## 2024-03-26 DIAGNOSIS — S7291XA Unspecified fracture of right femur, initial encounter for closed fracture: Secondary | ICD-10-CM | POA: Diagnosis not present

## 2024-03-26 DIAGNOSIS — I2510 Atherosclerotic heart disease of native coronary artery without angina pectoris: Secondary | ICD-10-CM | POA: Diagnosis not present

## 2024-03-26 DIAGNOSIS — D539 Nutritional anemia, unspecified: Secondary | ICD-10-CM | POA: Diagnosis not present

## 2024-03-26 DIAGNOSIS — G20A1 Parkinson's disease without dyskinesia, without mention of fluctuations: Secondary | ICD-10-CM | POA: Diagnosis not present

## 2024-03-26 LAB — CBC
HCT: 25.4 % — ABNORMAL LOW (ref 39.0–52.0)
Hemoglobin: 8.4 g/dL — ABNORMAL LOW (ref 13.0–17.0)
MCH: 32.6 pg (ref 26.0–34.0)
MCHC: 33.1 g/dL (ref 30.0–36.0)
MCV: 98.4 fL (ref 80.0–100.0)
Platelets: 199 10*3/uL (ref 150–400)
RBC: 2.58 MIL/uL — ABNORMAL LOW (ref 4.22–5.81)
RDW: 18.5 % — ABNORMAL HIGH (ref 11.5–15.5)
WBC: 7.1 10*3/uL (ref 4.0–10.5)
nRBC: 0 % (ref 0.0–0.2)

## 2024-03-26 LAB — BASIC METABOLIC PANEL WITH GFR
Anion gap: 7 (ref 5–15)
BUN: 37 mg/dL — ABNORMAL HIGH (ref 8–23)
CO2: 26 mmol/L (ref 22–32)
Calcium: 8 mg/dL — ABNORMAL LOW (ref 8.9–10.3)
Chloride: 102 mmol/L (ref 98–111)
Creatinine, Ser: 1.08 mg/dL (ref 0.61–1.24)
GFR, Estimated: >60 mL/min (ref >60)
Glucose, Bld: 112 mg/dL — ABNORMAL HIGH (ref 70–99)
Potassium: 4.1 mmol/L (ref 3.5–5.1)
Sodium: 135 mmol/L (ref 135–145)

## 2024-03-26 NOTE — Progress Notes (Signed)
 Progress Note   Patient: Matthew Liu WUX:[id number] DOB: 17-Feb-1941 DOA: 03/23/2024  DOS: the patient was seen and examined on 03/26/2024   Brief hospital course:  83 y.o. male with medical history significant for CAD status post CABG, IPF, OSA, history of CVA, Parkinson disease with mild cognitive impairment, and left hip fracture status postsurgical repair on 02/19/2024 who now presents with severe right hip pain after a fall.    Assessment and Plan:   Acute right subtrochanteric femur fracture - Evaluated by orthopedic surgery, following closely.  ORIF 6/15.  Pain control on board.  PT OT recommending STR.  WBAT.  Plavix  resumed 6/16.  Follow up with ortho in 2wks.     Macrocytic anemia - No active bleeding appreciated.  Hemoglobin stable.  S/p 1 unit PRBC prior to surgery.   CAD - Resume Plavix .  Continue on statin, BB.   Parkinson's disease with mild cognitive impairment - Sinemet  on board.   Obstructive sleep apnea - CPAP   Physical debilitation muscle weakness - Multiple falls at home, now with new fracture.  PT/OT on board.  Likely discharge to STR/SNF.   Subjective: Patient resting comfortably this morning.  No acute events overnight.  Denies any shortness of breath, chest pain, nausea, vomiting, abdominal pain.  Pain in the lower extremity is to be expected.  Physical Exam:  Vitals:   03/25/24 2102 03/26/24 0616 03/26/24 0739 03/26/24 1007  BP: (!) 130/97 (!) 142/67 132/65 (!) 123/59  Pulse: 88 73 (!) 45 (!) 43  Resp: 18 20    Temp: 98 F (36.7 C) 98.4 F (36.9 C) 98.1 F (36.7 C)   TempSrc:  Oral Oral   SpO2: 93% 95% 100%   Weight:      Height:        GENERAL:  Alert, pleasant, no acute distress  HEENT:  EOMI CARDIOVASCULAR:  RRR, no murmurs appreciated RESPIRATORY:  Clear to auscultation, no wheezing, rales, or rhonchi GASTROINTESTINAL:  Soft, nontender, nondistended EXTREMITIES: Frail no LE edema bilaterally NEURO:  No new focal deficits  appreciated SKIN: Bandage over right hip PSYCH:  Appropriate mood and affect    Data Reviewed:  No new imaging to review  Previous records (including but not limited to H&P, progress notes, nursing notes, TOC management) were reviewed in assessment of this patient.  Labs: CBC: Recent Labs  Lab 03/23/24 1742 03/24/24 0850 03/25/24 0619 03/26/24 0609  WBC 6.8 5.8 7.9 7.1  NEUTROABS 4.7  --   --   --   HGB 8.0* 7.7* 9.4* 8.4*  HCT 25.2* 23.9* 27.7* 25.4*  MCV 107.7* 106.2* 97.9 98.4  PLT 215 217 207 199   Basic Metabolic Panel: Recent Labs  Lab 03/23/24 1742 03/24/24 0850 03/25/24 0619 03/26/24 0609  NA 137 138 134* 135  K 4.3 4.7 4.1 4.1  CL 103 105 101 102  CO2 27 28 24 26   GLUCOSE 149* 125* 150* 112*  BUN 36* 32* 36* 37*  CREATININE 1.36* 1.10 1.30* 1.08  CALCIUM  8.2* 8.2* 7.7* 8.0*  MG  --  2.2 2.2  --    Liver Function Tests: No results for input(s): AST, ALT, ALKPHOS, BILITOT, PROT, ALBUMIN  in the last 168 hours. CBG: No results for input(s): GLUCAP in the last 168 hours.  Scheduled Meds:  atorvastatin   80 mg Oral QPM   carbidopa -levodopa   1 tablet Oral TID   carvedilol   3.125 mg Oral BID WC   clopidogrel   75 mg Oral Daily   feeding  supplement  237 mL Oral BID BM   multivitamin with minerals  1 tablet Oral Daily   pantoprazole   40 mg Oral Daily   Continuous Infusions: PRN Meds:.fentaNYL  (SUBLIMAZE ) injection, methocarbamol , oxyCODONE , polyethylene glycol, trimethobenzamide  Family Communication: None at bedside  Disposition: Status is: Inpatient Remains inpatient appropriate because: Hip fracture     Time spent: 30 minutes  Length of inpatient stay: 3 days  Author: Jodeane Mulligan, DO 03/26/2024 1:33 PM  For on call review [email or web address].

## 2024-03-26 NOTE — Progress Notes (Signed)
 Mobility Specialist Progress Note:    03/26/24 1445  Mobility  Activity Ambulated with assistance in hallway  Level of Assistance Contact guard assist, steadying assist  Assistive Device Front wheel walker  Distance Ambulated (ft) 80 ft  RLE Weight Bearing Per Provider Order WBAT  Activity Response Tolerated well  Mobility Referral Yes  Mobility visit 1 Mobility  Mobility Specialist Start Time (ACUTE ONLY) 1430  Mobility Specialist Stop Time (ACUTE ONLY) 1443  Mobility Specialist Time Calculation (min) (ACUTE ONLY) 13 min   Pt received in chair, agreeable to mobility. Pt required MinA to stand and CG for ambulation. No c/o throughout. Returned to bed w/o fault. Bed alarm on. Personal belongings and call light within reach. All needs met.   Doak Free Mobility Specialist  Please contact via [employer] or  Rehab Office [phone]

## 2024-03-26 NOTE — NC FL2 (Signed)
 [place]  MEDICAID FL2 LEVEL OF CARE FORM     IDENTIFICATION  Patient Name: Matthew Liu Birthdate: [birth year] Sex: male Admission Date (Current Location): 03/23/2024  [facility] and [place] Number:  Producer, television/film/video and Address:  [facility], [address], [place], [place] [address]      Provider Number: [id number]  Attending Physician Name and Address:  Jodeane Mulligan, DO  Relative Name and Phone Number:  Sexton,Lisa Daughter   [phone]    Current Level of Care: Hospital Recommended Level of Care: Skilled Nursing Facility Prior Approval Number:    Date Approved/Denied:   PASRR Number:    Discharge Plan: SNF    Current Diagnoses: Patient Active Problem List   Diagnosis Date Noted   Closed fracture of right femur, unspecified fracture morphology, initial encounter (HCC) 03/23/2024   Macrocytic anemia 03/23/2024   Fall 02/22/2024   Closed fracture of left hip (HCC) 02/21/2024   Closed left subtrochanteric femur fracture (HCC) 02/18/2024   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/2023   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   Generalized weakness 03/24/2022   Fatigue 03/24/2022   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   Parkinson's disease (HCC) 07/27/2021   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   Excessive daytime sleepiness 06/04/2021   Tremors of nervous system 03/18/2021   Memory loss 03/18/2021   OSA (obstructive sleep apnea) 11/30/2020   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   CAD (coronary artery disease) 12/11/2018   Lumbar compression fracture (HCC) 01/09/2018   PAD (peripheral artery disease) (HCC) 10/13/2015   History of CVA (cerebrovascular accident) 09/15/2015   Erectile dysfunction 12/01/2014   Aspirin  allergy 07/16/2013   Dyslipidemia 07/16/2013   Essential hypertension 07/13/2013   Gastroesophageal reflux disease 07/13/2013   Barrett's esophagus 07/13/2013    Orientation  RESPIRATION BLADDER Height & Weight     Self, Situation, Place  Normal Continent Weight: 134 lb 14.7 oz (61.2 kg) Height:  5' 7 (170.2 cm)  BEHAVIORAL SYMPTOMS/MOOD NEUROLOGICAL BOWEL NUTRITION STATUS      Continent Diet (see discharge summary)  AMBULATORY STATUS COMMUNICATION OF NEEDS Skin   Limited Assist Verbally Surgical wounds                       Personal Care Assistance Level of Assistance  Bathing, Feeding, Dressing Bathing Assistance: Limited assistance Feeding assistance: Limited assistance Dressing Assistance: Limited assistance     Functional Limitations Info  Sight, Hearing, Speech Sight Info: Adequate Hearing Info: Impaired Speech Info: Adequate    SPECIAL CARE FACTORS FREQUENCY  PT (By licensed PT), OT (By licensed OT)     PT Frequency: 5x week OT Frequency: 5x week            Contractures Contractures Info: Not present    Additional Factors Info  Code Status, Allergies Code Status Info: full Allergies Info: Aspirin , Quinine Derivatives           Current Medications (03/26/2024):  This is the current hospital active medication list Current Facility-Administered Medications  Medication Dose Route Frequency Provider Last Rate Last Admin   atorvastatin  (LIPITOR ) tablet 80 mg  80 mg Oral QPM Cockerham, Alicia M, PA-C   80 mg at 03/25/24 1727   carbidopa -levodopa  (SINEMET  IR) 25-100 MG per tablet immediate release 1 tablet  1 tablet Oral TID Cockerham, Alicia M, PA-C   1 tablet at 03/26/24 1006   carvedilol  (COREG ) tablet 3.125 mg  3.125 mg Oral  BID WC Cockerham, Alicia M, PA-C   3.125 mg at 03/25/24 1727   clopidogrel  (PLAVIX ) tablet 75 mg  75 mg Oral Daily Jodeane Mulligan, DO   75 mg at 03/26/24 1006   feeding supplement (ENSURE PLUS HIGH PROTEIN) liquid 237 mL  237 mL Oral BID BM Cockerham, Alicia M, PA-C       fentaNYL  (SUBLIMAZE ) injection 25-50 mcg  25-50 mcg Intravenous Q2H PRN Cockerham, Alicia M, PA-C   50 mcg at 03/24/24 2215    methocarbamol  (ROBAXIN ) tablet 500 mg  500 mg Oral Q6H PRN Cockerham, Alicia M, PA-C   500 mg at 03/25/24 1112   multivitamin with minerals tablet 1 tablet  1 tablet Oral Daily Cockerham, Alicia M, PA-C   1 tablet at 03/26/24 1006   oxyCODONE  (Oxy IR/ROXICODONE ) immediate release tablet 5 mg  5 mg Oral Q4H PRN Cockerham, Alicia M, PA-C   5 mg at 03/25/24 1112   pantoprazole  (PROTONIX ) EC tablet 40 mg  40 mg Oral Daily Cockerham, Alicia M, PA-C   40 mg at 03/26/24 1006   polyethylene glycol (MIRALAX  / GLYCOLAX ) packet 17 g  17 g Oral Daily PRN Cockerham, Alicia M, PA-C   17 g at 03/26/24 1005   trimethobenzamide (TIGAN) injection 200 mg  200 mg Intramuscular Q6H PRN Cockerham, Alicia M, PA-C         Discharge Medications: Please see discharge summary for a list of discharge medications.  Relevant Imaging Results:  Relevant Lab Results:   Additional Information SSN: [id number]  Elspeth Hals, LCSW

## 2024-03-26 NOTE — Care Management Important Message (Signed)
 Important Message  Patient Details  Name: Matthew Liu MRN: [id number] Date of Birth: 01/05/1941   Important Message Given:  Yes - Medicare IM     Felix Host 03/26/2024, 2:35 PM

## 2024-03-26 NOTE — Progress Notes (Signed)
 RE:  Matthew Liu       Date of Birth:   November 15, 2040    Date:  03/26/24        To Whom It May Concern:  Please be advised that the above-named patient will require a short-term nursing home stay - anticipated 30 days or less for rehabilitation and strengthening.  The plan is for return home.                 MD signature                Date

## 2024-03-26 NOTE — Progress Notes (Signed)
 Physical Therapy Treatment Patient Details Name: Matthew Liu MRN: [id number] DOB: 1941/02/11 Today's Date: 03/26/2024   History of Present Illness 83 y.o. male who now presents with severe right hip pain after a fall, and ORIF 6/15; WBAT; with medical history significant for CAD status post CABG, IPF, OSA, history of CVA, Parkinson disease with mild cognitive impairment, and left hip fracture status postsurgical repair on 02/19/2024    PT Comments  Continuing work on functional mobility and activity tolerance;  session focused on progressive amb with good progress in gait distance; Cues to stand tall and activate quads and glutes during loading response and into stance on both R and LLEs; Overall progressing well; Anticipate continuing good progress at post-acute rehabilitation.    If plan is discharge home, recommend the following: A little help with walking and/or transfers;Assistance with cooking/housework;Assist for transportation;Help with stairs or ramp for entrance   Can travel by private vehicle     No  Equipment Recommendations  None recommended by PT (I believe he is well equipped)    Recommendations for Other Services OT consult (Will consider OT consult)     Precautions / Restrictions Precautions Precautions: Fall Recall of Precautions/Restrictions: Intact Restrictions RLE Weight Bearing Per Provider Order: Weight bearing as tolerated     Mobility  Bed Mobility Overal bed mobility: Needs Assistance Bed Mobility: Supine to Sit     Supine to sit: Mod assist     General bed mobility comments: Heavy mod assist to scoot hips to EOB and assist RLE in sliding across bed; moved in small increments to EOB and pull to sit; incr time    Transfers Overall transfer level: Needs assistance Equipment used: Rolling walker (2 wheels) Transfers: Sit to/from Stand Sit to Stand: Min assist           General transfer comment: stood from elevated bed with light min assist  and smooth rise; stood for lwer recliner weat with good use of arm rests and light min assist to steady    Ambulation/Gait Ambulation/Gait assistance: Min assist   Assistive device: Rolling walker (2 wheels) Gait Pattern/deviations: Step-through pattern, Trunk flexed       General Gait Details: Cues for sequence and to self-monitor for acitivy tolerance; 2 bouts of amb   Stairs             Wheelchair Mobility     Tilt Bed    Modified Rankin (Stroke Patients Only)       Balance Overall balance assessment: Needs assistance   Sitting balance-Leahy Scale: Fair       Standing balance-Leahy Scale: Poor                              Communication Communication Communication: No apparent difficulties;Impaired Factors Affecting Communication: Hearing impaired  Cognition Arousal: Alert Behavior During Therapy: WFL for tasks assessed/performed   PT - Cognitive impairments: No apparent impairments                         Following commands: Intact      Cueing Cueing Techniques: Verbal cues, Gestural cues, Tactile cues, Visual cues  Exercises Total Joint Exercises Ankle Circles/Pumps: AROM, Both, 5 reps Quad Sets: AROM, Both, 10 reps Short Arc Quad: AROM, Right, 5 reps    General Comments        Pertinent Vitals/Pain Pain Assessment Faces Pain Scale: Hurts little more Pain Location: R  hip Pain Descriptors / Indicators: Grimacing, Guarding    Home Living                          Prior Function            PT Goals (current goals can now be found in the care plan section) Acute Rehab PT Goals Patient Stated Goal: hopes to get home to wife soon PT Goal Formulation: With patient Time For Goal Achievement: 04/08/24 Potential to Achieve Goals: Good    Frequency    Min 2X/week      PT Plan      Co-evaluation              AM-PAC PT 6 Clicks Mobility   Outcome Measure  Help needed turning from your  back to your side while in a flat bed without using bedrails?: A Lot Help needed moving from lying on your back to sitting on the side of a flat bed without using bedrails?: A Lot Help needed moving to and from a bed to a chair (including a wheelchair)?: A Lot Help needed standing up from a chair using your arms (e.g., wheelchair or bedside chair)?: A Little Help needed to walk in hospital room?: A Little Help needed climbing 3-5 steps with a railing? : A Lot 6 Click Score: 14    End of Session Equipment Utilized During Treatment: Gait belt Activity Tolerance: Patient tolerated treatment well Patient left: in chair;with call bell/phone within reach;with chair alarm set Nurse Communication: Mobility status PT Visit Diagnosis: Unsteadiness on feet (R26.81);Other abnormalities of gait and mobility (R26.89);History of falling (Z91.81);Pain Pain - Right/Left: Right Pain - part of body: Hip     Time: 1225-1301 PT Time Calculation (min) (ACUTE ONLY): 36 min  Charges:    $Gait Training: 23-37 mins                       Darcus Eastern, PT  Acute Rehabilitation Services Office [phone] Secure Chat welcomed    [name] 03/26/2024, 2:38 PM

## 2024-03-26 NOTE — Progress Notes (Signed)
     Subjective: Patient reports pain as mild.  Slept well overnight. Patient has no new issues or concerns. Worked well with PT yesterday.  Denies distal numbness and tingling.  Plan for mobilization today with physical therapy mobilizing 45 feet. Heart rate improved last night no longer tachycardic.  Objective:   VITALS:   Vitals:   03/25/24 0840 03/25/24 1501 03/25/24 1748 03/25/24 2102  BP: 127/84 (!) 134/54  (!) 130/97  Pulse:   97 88  Resp: 15 16  18   Temp: 98.2 F (36.8 C) 98.2 F (36.8 C)  98 F (36.7 C)  TempSrc:      SpO2: 95% 100% 98% 93%  Weight:      Height:        Sensation intact distally Intact pulses distally Dorsiflexion/Plantar flexion intact Incision: dressing C/D/I Compartment soft    Lab Results  Component Value Date   WBC 7.9 03/25/2024   HGB 9.4 (L) 03/25/2024   HCT 27.7 (L) 03/25/2024   MCV 97.9 03/25/2024   PLT 207 03/25/2024   BMET    Component Value Date/Time   NA 134 (L) 03/25/2024 0619   NA 138 09/02/2020 0844   K 4.1 03/25/2024 0619   CL 101 03/25/2024 0619   CO2 24 03/25/2024 0619   GLUCOSE 150 (H) 03/25/2024 0619   BUN 36 (H) 03/25/2024 0619   BUN 20 09/02/2020 0844   CREATININE 1.30 (H) 03/25/2024 0619   CALCIUM  7.7 (L) 03/25/2024 0619   GFRNONAA 54 (L) 03/25/2024 1610      Xray: PACU x-rays demonstrate interval fixation of subtrochanteric femur fracture with Seph medullary nail.  No adverse features.  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 DVT prophylaxis: Resume Plavix  postop day 1 Follow up: 2 weeks after surgery for a wound check with Dr. Pryor Browning at [facility].  Address: [address], [place], [place] [address]  Office Phone: [phone]  Murleen Arms 03/26/2024, 6:15 AM   Priscille Brought, MD  Contact information:   [phone] 7am-5pm epic message Dr. Pryor Browning, or call office for patient follow up: [phone] After hours and holidays please check [email or web address] for group call information for [facility]

## 2024-03-26 NOTE — TOC Initial Note (Signed)
 Transition of Care [facility]) - Initial/Assessment Note    Patient Details  Name: Matthew Liu MRN: [id number] Date of Birth: 27-Feb-1941  [facility]) CM/SW Contact:    Elspeth Hals, LCSW Phone Number: 03/26/2024, 11:44 AM  Clinical Narrative:    Pt oriented x3, able to participate in conversation.  Pt from home with wife, current [facility] services, but he does not recall which agency.  Permission given to speak with daughter Edwina Gram.  Discussed PT recommendation for SNF and pt is agreeable, requesting [facility].  Referral sent to [facility], reached out to [facility] to review.    Need to upload passr docs               Expected Discharge Plan: Skilled Nursing Facility Barriers to Discharge: Continued Medical Work up, SNF Pending bed offer   Patient Goals and CMS Choice Patient states their goals for this hospitalization and ongoing recovery are:: walk, drive   Choice offered to / list presented to : Patient (requesting [facility] [place])      Expected Discharge Plan and Services In-house Referral: Clinical Social Work   Post Acute Care Choice: Skilled Nursing Facility Living arrangements for the past 2 months: Single Family Home                                      Prior Living Arrangements/Services Living arrangements for the past 2 months: Single Family Home Lives with:: Spouse Patient language and need for interpreter reviewed:: Yes Do you feel safe going back to the place where you live?: Yes      Need for Family Participation in Patient Care: Yes (Comment) Care giver support system in place?: Yes (comment) Current home services: Home PT (pt does not recall which [facility] agency) Criminal Activity/Legal Involvement Pertinent to Current Situation/Hospitalization: No - Comment as needed  Activities of Daily Living      Permission Sought/Granted Permission sought to share information with : Family Supports Permission granted to share information with : Yes,  Verbal Permission Granted  Share Information with NAME: daughter Edwina Gram  Permission granted to share info w AGENCY: SNF        Emotional Assessment Appearance:: Appears stated age Attitude/Demeanor/Rapport: Engaged Affect (typically observed): Appropriate, Pleasant Orientation: : Oriented to Self, Oriented to Place, Oriented to Situation      Admission diagnosis:  Pain [R52] Closed fracture of right hip, initial encounter (HCC) [S72.001A] Closed fracture of right femur, unspecified fracture morphology, initial encounter (HCC) [S72.91XA] Patient Active Problem List   Diagnosis Date Noted   Closed fracture of right femur, unspecified fracture morphology, initial encounter (HCC) 03/23/2024   Macrocytic anemia 03/23/2024   Fall 02/22/2024   Closed fracture of left hip (HCC) 02/21/2024   Closed left subtrochanteric femur fracture (HCC) 02/18/2024   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/2023   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   Generalized weakness 03/24/2022   Fatigue 03/24/2022   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   Parkinson's disease (HCC) 07/27/2021   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   Excessive daytime sleepiness 06/04/2021   Tremors of nervous system 03/18/2021   Memory loss 03/18/2021   OSA (obstructive sleep apnea) 11/30/2020   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   CAD (coronary artery disease) 12/11/2018   Lumbar compression fracture (HCC) 01/09/2018   PAD (peripheral artery disease) (HCC) 10/13/2015   History of CVA (cerebrovascular accident) 09/15/2015  Erectile dysfunction 12/01/2014   Aspirin  allergy 07/16/2013   Dyslipidemia 07/16/2013   Essential hypertension 07/13/2013   Gastroesophageal reflux disease 07/13/2013   Barrett's esophagus 07/13/2013   PCP:  Adrian Hopper, MD Pharmacy:   [facility] [address], [place] - [address] [place] [address] Phone: [phone]  Fax: [phone]  [facility] - [place], [place] - [address] [address] [address] [place] [address] Phone: [phone] Fax: [phone]  [facility] [address] [place] [address] Phone: [phone] Fax: [phone]  [facility] [address] [place] [address] Phone: [phone] Fax: [phone]     Social Drivers of Health (SDOH) Social History: SDOH Screenings   Food Insecurity: No Food Insecurity (03/23/2024)  Housing: Unknown (03/23/2024)  Transportation Needs: No Transportation Needs (03/24/2024)  Utilities: Not At Risk (03/24/2024)  Social Connections: Moderately Integrated (03/24/2024)  Tobacco Use: Low Risk  (03/24/2024)   SDOH Interventions: Housing Interventions: Intervention Not Indicated Transportation Interventions: Intervention Not Indicated Utilities Interventions: Intervention Not Indicated Social Connections Interventions: Intervention Not Indicated   Readmission Risk Interventions     No data to display

## 2024-03-26 NOTE — Plan of Care (Signed)

## 2024-03-27 DIAGNOSIS — S72001D Fracture of unspecified part of neck of right femur, subsequent encounter for closed fracture with routine healing: Secondary | ICD-10-CM | POA: Diagnosis not present

## 2024-03-27 DIAGNOSIS — S7291XA Unspecified fracture of right femur, initial encounter for closed fracture: Secondary | ICD-10-CM | POA: Diagnosis not present

## 2024-03-27 DIAGNOSIS — Z743 Need for continuous supervision: Secondary | ICD-10-CM | POA: Diagnosis not present

## 2024-03-27 DIAGNOSIS — L89322 Pressure ulcer of left buttock, stage 2: Secondary | ICD-10-CM | POA: Diagnosis not present

## 2024-03-27 DIAGNOSIS — R001 Bradycardia, unspecified: Secondary | ICD-10-CM | POA: Diagnosis not present

## 2024-03-27 DIAGNOSIS — R262 Difficulty in walking, not elsewhere classified: Secondary | ICD-10-CM | POA: Diagnosis not present

## 2024-03-27 DIAGNOSIS — G8918 Other acute postprocedural pain: Secondary | ICD-10-CM | POA: Diagnosis not present

## 2024-03-27 DIAGNOSIS — L988 Other specified disorders of the skin and subcutaneous tissue: Secondary | ICD-10-CM | POA: Diagnosis not present

## 2024-03-27 DIAGNOSIS — D62 Acute posthemorrhagic anemia: Secondary | ICD-10-CM | POA: Diagnosis not present

## 2024-03-27 LAB — BASIC METABOLIC PANEL WITH GFR
Anion gap: 4 — ABNORMAL LOW (ref 5–15)
BUN: 28 mg/dL — ABNORMAL HIGH (ref 8–23)
CO2: 28 mmol/L (ref 22–32)
Calcium: 7.9 mg/dL — ABNORMAL LOW (ref 8.9–10.3)
Chloride: 102 mmol/L (ref 98–111)
Creatinine, Ser: 1.01 mg/dL (ref 0.61–1.24)
GFR, Estimated: >60 mL/min (ref >60)
Glucose, Bld: 110 mg/dL — ABNORMAL HIGH (ref 70–99)
Potassium: 4.2 mmol/L (ref 3.5–5.1)
Sodium: 134 mmol/L — ABNORMAL LOW (ref 135–145)

## 2024-03-27 LAB — CBC
HCT: 25.9 % — ABNORMAL LOW (ref 39.0–52.0)
Hemoglobin: 8.4 g/dL — ABNORMAL LOW (ref 13.0–17.0)
MCH: 32.6 pg (ref 26.0–34.0)
MCHC: 32.4 g/dL (ref 30.0–36.0)
MCV: 100.4 fL — ABNORMAL HIGH (ref 80.0–100.0)
Platelets: 209 10*3/uL (ref 150–400)
RBC: 2.58 MIL/uL — ABNORMAL LOW (ref 4.22–5.81)
RDW: 17.9 % — ABNORMAL HIGH (ref 11.5–15.5)
WBC: 7.1 10*3/uL (ref 4.0–10.5)
nRBC: 0 % (ref 0.0–0.2)

## 2024-03-27 MED ORDER — ACETAMINOPHEN 500 MG PO TABS: 1000.0000 mg | ORAL_TABLET | Freq: Three times a day (TID) | ORAL | Status: DC | PRN

## 2024-03-27 MED ORDER — SENNOSIDES-DOCUSATE SODIUM 8.6-50 MG PO TABS
2.0000 | ORAL_TABLET | Freq: Two times a day (BID) | ORAL | Status: DC
Start: 2024-03-27 — End: 2024-08-01

## 2024-03-27 MED ORDER — OXYCODONE HCL 5 MG PO TABS: 5.0000 mg | ORAL_TABLET | ORAL | 0 refills | Status: AC | PRN

## 2024-03-27 MED ORDER — ENSURE PLUS HIGH PROTEIN PO LIQD: 237.0000 mL | Freq: Two times a day (BID) | ORAL | Status: AC

## 2024-03-27 MED ORDER — ORAL CARE MOUTH RINSE: 15.0000 mL | OROMUCOSAL | Status: DC | PRN

## 2024-03-27 MED ORDER — ONDANSETRON HCL 4 MG PO TABS: 4.0000 mg | ORAL_TABLET | Freq: Three times a day (TID) | ORAL | Status: AC | PRN

## 2024-03-27 MED ORDER — METHOCARBAMOL 500 MG PO TABS: 500.0000 mg | ORAL_TABLET | Freq: Four times a day (QID) | ORAL | 0 refills | Status: DC | PRN

## 2024-03-27 MED ORDER — SENNOSIDES-DOCUSATE SODIUM 8.6-50 MG PO TABS
2.0000 | ORAL_TABLET | Freq: Two times a day (BID) | ORAL | Status: DC
  Administered 2024-03-27: 2 via ORAL
  Filled 2024-03-27: qty 2

## 2024-03-27 NOTE — Progress Notes (Signed)
     Subjective: Patient reports pain as minmial.  Slept well overnight. Patient has no new issues or concerns. Conitnues to do very well with PT. No new concerns.  Objective:   VITALS:   Vitals:   03/26/24 1400 03/26/24 2108 03/27/24 0453 03/27/24 0805  BP: 132/73 (!) 124/49 (!) 149/73 130/65  Pulse: (!) 48  80   Resp:  16 18 15   Temp: 98 F (36.7 C) 98.2 F (36.8 C) 97.7 F (36.5 C) 98 F (36.7 C)  TempSrc:  Oral Oral   SpO2: 100% 98% 96% 97%  Weight:      Height:        Sensation intact distally Intact pulses distally Dorsiflexion/Plantar flexion intact Incision: dressing C/D/I Compartment soft    Lab Results  Component Value Date   WBC 7.1 03/27/2024   HGB 8.4 (L) 03/27/2024   HCT 25.9 (L) 03/27/2024   MCV 100.4 (H) 03/27/2024   PLT 209 03/27/2024   BMET    Component Value Date/Time   NA 134 (L) 03/27/2024 0547   NA 138 09/02/2020 0844   K 4.2 03/27/2024 0547   CL 102 03/27/2024 0547   CO2 28 03/27/2024 0547   GLUCOSE 110 (H) 03/27/2024 0547   BUN 28 (H) 03/27/2024 0547   BUN 20 09/02/2020 0844   CREATININE 1.01 03/27/2024 0547   CALCIUM  7.9 (L) 03/27/2024 0547   GFRNONAA >60 03/27/2024 0547      Xray: PACU x-rays demonstrate interval fixation of subtrochanteric femur fracture with Seph medullary nail.  No adverse features.  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 DVT prophylaxis: Resume Plavix  postop day 1 Follow up: 2 weeks after surgery for a wound check with Dr. Pryor Browning at [facility].  Address: [address], [place], [place] [address]  Office Phone: [phone]  Murleen Arms 03/27/2024, 8:36 AM   Priscille Brought, MD  Contact information:   [phone] 7am-5pm epic message Dr. Pryor Browning, or call office for patient follow up: [phone] After hours and holidays please check [email or web address] for group call information for [facility]

## 2024-03-27 NOTE — Discharge Summary (Signed)
 [facility]  Physician Discharge Summary   Patient ID: Matthew Liu MRN: [id number] DOB/AGE: 06-29-1941 83 y.o.  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 male with medical history significant for CAD status post CABG, IPF, OSA, history of CVA, Parkinson disease with mild cognitive impairment, and left hip fracture status postsurgical repair on 02/19/2024 who now presents with severe right hip pain after a fall.     HOSPITAL COURSE:   Acute right subtrochanteric femur fracture Patient was seen by orthopedics.  Underwent ORIF on 6/15.  Pain is reasonably well-controlled.  Seen by physical therapy who recommended short-term rehab.  Plavix  has been resumed.  Blood work is stable.  Okay for discharge whenever skilled nursing facility bed is available.     Macrocytic anemia No active bleeding appreciated.  Hemoglobin stable.  S/p 1 unit PRBC prior to surgery.   CAD Resume Plavix .  Continue on statin, BB.   Parkinson's disease with mild cognitive impairment Sinemet  on board.   Obstructive sleep apnea CPAP   Patient is stable.  Okay for discharge to SNF.  PERTINENT LABS:  The results of significant diagnostics from this hospitalization (including imaging,  microbiology, ancillary and laboratory) are listed below for reference.    Microbiology: Recent Results (from the past 240 hours)  Surgical pcr screen     Status: None   Collection Time: 03/24/24 10:06 AM   Specimen: Nasal Mucosa; Nasal Swab  Result Value Ref Range Status   MRSA, PCR NEGATIVE NEGATIVE Final   Staphylococcus aureus NEGATIVE NEGATIVE Final    Comment: (NOTE) The Xpert SA Assay (FDA approved for NASAL specimens in patients 61 years of age and older), is one component of a comprehensive surveillance program. It is not intended to diagnose infection nor to guide or monitor treatment. Performed at [facility], [address], [place], [place] [address]      Labs:   Basic Metabolic Panel: Recent Labs  Lab 03/23/24 1742 03/24/24 0850 03/25/24 0619 03/26/24 0609 03/27/24 0547  NA 137 138 134* 135 134*  K 4.3 4.7 4.1 4.1 4.2  CL 103 105 101 102 102  CO2 27 28 24 26 28   GLUCOSE 149* 125* 150* 112* 110*  BUN 36* 32* 36* 37* 28*  CREATININE 1.36* 1.10 1.30* 1.08 1.01  CALCIUM  8.2* 8.2* 7.7* 8.0* 7.9*  MG  --  2.2 2.2  --   --     CBC: Recent Labs  Lab 03/23/24 1742 03/24/24 0850 03/25/24 0619 03/26/24 0609 03/27/24 0547  WBC 6.8 5.8 7.9 7.1 7.1  NEUTROABS 4.7  --   --   --   --   HGB 8.0* 7.7* 9.4* 8.4* 8.4*  HCT 25.2* 23.9* 27.7* 25.4* 25.9*  MCV 107.7* 106.2* 97.9 98.4 100.4*  PLT 215 217 207 199 209  IMAGING STUDIES DG FEMUR PORT, MIN 2 VIEWS RIGHT Result Date: 03/24/2024 CLINICAL DATA:  [id number] Post-operative state [id number] EXAM: RIGHT FEMUR PORTABLE 2 VIEW COMPARISON:  June fourteenth 2025 FINDINGS: Status post intramedullary rod fixation of the femur. Improved alignment of the comminuted femoral shaft fracture with persistent mild medial displacement of the distal fragment. Comminuted proximal fragments are displaced anterior and posterior compared to the distal femur. Fracture extends into the lesser trochanter. Soft tissue air. Vascular  calcifications. Osteopenia. IMPRESSION: Status post intramedullary rod fixation of the femur with improved alignment of the comminuted femoral shaft fracture. Electronically Signed   By: Clancy Crimes M.D.   On: 03/24/2024 15:41   DG FEMUR, MIN 2 VIEWS RIGHT Result Date: 03/24/2024 CLINICAL DATA:  Open reduction internal fixation of proximal right femur EXAM: RIGHT FEMUR 2 VIEWS COMPARISON:  AP pelvis 03/23/2024 FINDINGS: Images were performed intraoperatively without the presence of a radiologist. Redemonstration of spiral fracture of the proximal right femur with mild-to-moderate displacement. The patient is undergoing long cephalomedullary nail fixation of the right femur. No hardware complication is seen. Total fluoroscopy images: 19 Total fluoroscopy time: 155 seconds Total dose: Radiation Exposure Index (as provided by the fluoroscopic device): 12.6 mGy air Kerma Please see intraoperative findings for further detail. IMPRESSION: Intraoperative fluoroscopic guidance for long cephalomedullary nail fixation of the proximal right femur. Electronically Signed   By: Bertina Broccoli M.D.   On: 03/24/2024 14:50   DG C-Arm 1-60 Min-No Report Result Date: 03/24/2024 Fluoroscopy was utilized by the requesting physician.  No radiographic interpretation.   DG C-Arm 1-60 Min-No Report Result Date: 03/24/2024 Fluoroscopy was utilized by the requesting physician.  No radiographic interpretation.   DG Chest Portable 1 View Result Date: 03/23/2024 CLINICAL DATA:  hip fx right EXAM: PORTABLE CHEST 1 VIEW COMPARISON:  Chest x-ray 03/23/2024, CT chest 04/03/2023 FINDINGS: The heart and mediastinal contours are within normal limits. Atherosclerotic plaque. No focal consolidation. No pulmonary edema. No pleural effusion. No pneumothorax. No acute osseous abnormality.  Sternotomy wires are noted. IMPRESSION: 1. No active disease. 2.  Aortic Atherosclerosis (ICD10-I70.0). Electronically Signed   By: Morgane  Naveau M.D.    On: 03/23/2024 20:21   DG Pelvis Portable Result Date: 03/23/2024 CLINICAL DATA:  hip fx right EXAM: PORTABLE PELVIS 1-2 VIEWS COMPARISON:  X-ray pelvis 02/18/2024 FINDINGS: Partially visualized acute displaced proximal right femoral fracture involving the lesser trochanter and proximal shaft. No right hip dislocation. No acute displaced fracture or dislocation of the left hip. Interlocking intramedullary screw partially visualized left femur. There is no evidence of pelvic fracture or diastasis. No pelvic bone lesions are seen. IMPRESSION: Partially visualized acute displaced proximal right femoral fracture involving the lesser trochanter and proximal shaft. Electronically Signed   By: Morgane  Naveau M.D.   On: 03/23/2024 20:15    DISCHARGE EXAMINATION: Vitals:   03/26/24 1400 03/26/24 2108 03/27/24 0453 03/27/24 0805  BP: 132/73 (!) 124/49 (!) 149/73 130/65  Pulse: (!) 48  80 90  Resp:  16 18 15   Temp: 98 F (36.7 C) 98.2 F (36.8 C) 97.7 F (36.5 C) 98 F (36.7 C)  TempSrc:  Oral Oral   SpO2: 100% 98% 96% 97%  Weight:      Height:       General appearance: Awake alert.  In no distress Resp: Clear to auscultation bilaterally.  Normal effort Cardio: S1-S2 is normal regular.  No S3-S4.  No rubs murmurs or bruit GI: Abdomen is soft.  Nontender nondistended.  Bowel sounds  are present normal.  No masses organomegaly  DISPOSITION: SNF  Discharge Instructions     Call MD for:  difficulty breathing, headache or visual disturbances   Complete by: As directed    Call MD for:  extreme fatigue   Complete by: As directed    Call MD for:  persistant dizziness or light-headedness   Complete by: As directed    Call MD for:  persistant nausea and vomiting   Complete by: As directed    Call MD for:  severe uncontrolled pain   Complete by: As directed    Call MD for:  temperature >100.4   Complete by: As directed    Diet - low sodium heart healthy   Complete by: As directed     Discharge instructions   Complete by: As directed    Please review instructions on the discharge summary.  You were cared for by a hospitalist during your hospital stay. If you have any questions about your discharge medications or the care you received while you were in the hospital after you are discharged, you can call the unit and asked to speak with the hospitalist on call if the hospitalist that took care of you is not available. Once you are discharged, your primary care physician will handle any further medical issues. Please note that NO REFILLS for any discharge medications will be authorized once you are discharged, as it is imperative that you return to your primary care physician (or establish a relationship with a primary care physician if you do not have one) for your aftercare needs so that they can reassess your need for medications and monitor your lab values. If you do not have a primary care physician, you can call [phone] for a physician referral.   Increase activity slowly   Complete by: As directed    No wound care   Complete by: As directed          Allergies as of 03/27/2024       Reactions   Aspirin  Anaphylaxis, Shortness Of Breath   Quinine Derivatives Hives, Other (See Comments)   looks like he has the measles        Medication List     STOP taking these medications    tiZANidine 2 MG tablet Commonly known as: ZANAFLEX       TAKE these medications    acetaminophen  500 MG tablet Commonly known as: TYLENOL  Take 2 tablets (1,000 mg total) by mouth every 8 (eight) hours as needed for mild pain (pain score 1-3). What changed:  when to take this reasons to take this   atorvastatin  80 MG tablet Commonly known as: LIPITOR  Take 1 tablet (80 mg total) by mouth every evening.   carbidopa -levodopa  25-100 MG tablet Commonly known as: SINEMET  IR Take 1 tablet by mouth 3 (three) times daily.   carvedilol  3.125 MG tablet Commonly known as:  Coreg  Take 1 tablet (3.125 mg total) by mouth 2 (two) times daily with a meal.   clopidogrel  75 MG tablet Commonly known as: PLAVIX  Take 1 tablet (75 mg total) by mouth daily.   cyanocobalamin  100 MCG tablet Commonly known as: VITAMIN B12 Take 100 mcg by mouth daily.   docusate sodium  100 MG capsule Commonly known as: COLACE Take 100 mg by mouth daily.   feeding supplement Liqd Take 237 mLs by mouth 2 (two) times daily between meals.   meloxicam 15 MG tablet Commonly known as: MOBIC Take 15 mg by mouth daily as  needed for pain.   methocarbamol  500 MG tablet Commonly known as: ROBAXIN  Take 1 tablet (500 mg total) by mouth every 6 (six) hours as needed for muscle spasms.   ondansetron  4 MG tablet Commonly known as: Zofran  Take 1 tablet (4 mg total) by mouth every 8 (eight) hours as needed for up to 14 days for nausea or vomiting.   One-A-Day Mens 50+ Tabs Take 1 tablet by mouth daily with breakfast.   oxyCODONE  5 MG immediate release tablet Commonly known as: Roxicodone  Take 1 tablet (5 mg total) by mouth every 4 (four) hours as needed for up to 7 days for severe pain (pain score 7-10) or moderate pain (pain score 4-6). What changed:  how much to take when to take this reasons to take this   pantoprazole  40 MG tablet Commonly known as: PROTONIX  Take 1 tablet (40 mg total) by mouth daily.   polyethylene glycol 17 g packet Commonly known as: MIRALAX  / GLYCOLAX  Take 17 g by mouth daily. What changed:  when to take this reasons to take this   senna-docusate 8.6-50 MG tablet Commonly known as: Senokot-S Take 2 tablets by mouth 2 (two) times daily. What changed:  how much to take when to take this reasons to take this   Vitamin D3 50 MCG (2000 UT) capsule Take 2,000 Units by mouth daily.          Follow-up Information     Murleen Arms, MD Follow up in 2 week(s).   Specialty: Orthopedic Surgery Contact information: [address] [place] [address] [phone]                 TOTAL DISCHARGE TIME: 35 minutes  Shellia Hartl Lyndon Santiago  [facility] Pager on [email or web address]  03/27/2024, 9:14 AM

## 2024-03-27 NOTE — Progress Notes (Signed)
 Physical Therapy Treatment Patient Details Name: Matthew Liu MRN: [id number] DOB: 1941-08-29 Today's Date: 03/27/2024   History of Present Illness 83 y.o. male who now presents with severe right hip pain after a fall, and ORIF 6/15; WBAT; with medical history significant for CAD status post CABG, IPF, OSA, history of CVA, Parkinson disease with mild cognitive impairment, and left hip fracture status postsurgical repair on 02/19/2024    PT Comments  Patient resting in recliner and reports he will be getting to rehab later today but unsure when. Pt agreeable to mobilize with therapy and reports motivated to improve and be able to return home. Patient required min assist to stabilize balance and cues for proximity to RW as pt tends to advance walker too far ahead of self. As pt fatigued tremor slightly increased and small standing rest break provided. EOS pt returned to recliner and agreeable to remain OOB. Instructed on LE exercises for strengthening. Will continue to progress as able. Recommend follow up inpatient therapy.     If plan is discharge home, recommend the following: A little help with walking and/or transfers;Assistance with cooking/housework;Assist for transportation;Help with stairs or ramp for entrance   Can travel by private vehicle     No  Equipment Recommendations  None recommended by PT    Recommendations for Other Services       Precautions / Restrictions Precautions Precautions: Fall Recall of Precautions/Restrictions: Intact Restrictions Weight Bearing Restrictions Per Provider Order: No RLE Weight Bearing Per Provider Order: Weight bearing as tolerated     Mobility  Bed Mobility               General bed mobility comments: pt OOB in recliner    Transfers Overall transfer level: Needs assistance Equipment used: Rolling walker (2 wheels) Transfers: Sit to/from Stand Sit to Stand: Min assist, Contact guard assist           General transfer  comment: CGA for power up from recliner with light assist to ensure steady during hand transition from chair to RW. heavy reliance on UE's for power up.    Ambulation/Gait Ambulation/Gait assistance: Min assist Gait Distance (Feet): 90 Feet Assistive device: Rolling walker (2 wheels) Gait Pattern/deviations: Step-through pattern, Trunk flexed Gait velocity: decr     General Gait Details: cues for proximity to RW and min assist to steady. pt with increased tremor/shaking during gait.   Stairs             Wheelchair Mobility     Tilt Bed    Modified Rankin (Stroke Patients Only)       Balance Overall balance assessment: Needs assistance Sitting-balance support: Single extremity supported, No upper extremity supported, Feet supported Sitting balance-Leahy Scale: Fair     Standing balance support: Bilateral upper extremity supported, Single extremity supported, During functional activity, Reliant on assistive device for balance Standing balance-Leahy Scale: Poor                              Communication Communication Communication: Impaired Factors Affecting Communication: Hearing impaired;Other (comment) (requires mildly increased time for processing and word finding)  Cognition Arousal: Alert Behavior During Therapy: WFL for tasks assessed/performed   PT - Cognitive impairments: No apparent impairments                         Following commands: Intact      Cueing Cueing Techniques: Verbal cues,  Gestural cues, Tactile cues, Visual cues  Exercises Total Joint Exercises Ankle Circles/Pumps: AROM, Both, 10 reps Quad Sets: AROM, Both, 20 reps Hip ABduction/ADduction: AROM, Both, 10 reps Long Arc Quad: AROM, Both, 10 reps    General Comments        Pertinent Vitals/Pain Pain Assessment Pain Assessment: Faces Faces Pain Scale: Hurts little more Pain Location: R hip Pain Descriptors / Indicators: Grimacing, Guarding Pain  Intervention(s): Limited activity within patient's tolerance, Monitored during session, Repositioned    Home Living                          Prior Function            PT Goals (current goals can now be found in the care plan section) Acute Rehab PT Goals Patient Stated Goal: hopes to get home to wife soon PT Goal Formulation: With patient Time For Goal Achievement: 04/08/24 Potential to Achieve Goals: Good Progress towards PT goals: Progressing toward goals    Frequency    Min 2X/week      PT Plan      Co-evaluation              AM-PAC PT 6 Clicks Mobility   Outcome Measure  Help needed turning from your back to your side while in a flat bed without using bedrails?: A Lot Help needed moving from lying on your back to sitting on the side of a flat bed without using bedrails?: A Lot Help needed moving to and from a bed to a chair (including a wheelchair)?: A Little Help needed standing up from a chair using your arms (e.g., wheelchair or bedside chair)?: A Little Help needed to walk in hospital room?: A Little Help needed climbing 3-5 steps with a railing? : Total 6 Click Score: 14    End of Session Equipment Utilized During Treatment: Gait belt Activity Tolerance: Patient tolerated treatment well Patient left: in chair;with call bell/phone within reach;with chair alarm set Nurse Communication: Mobility status PT Visit Diagnosis: Unsteadiness on feet (R26.81);Other abnormalities of gait and mobility (R26.89);History of falling (Z91.81);Pain Pain - Right/Left: Right Pain - part of body: Hip     Time: 1355-1415 PT Time Calculation (min) (ACUTE ONLY): 20 min  Charges:    $Therapeutic Exercise: 8-22 mins PT General Charges $$ ACUTE PT VISIT: 1 Visit                     Tish Forge, DPT Acute Rehabilitation Services Office [phone]  03/27/24 3:37 PM

## 2024-03-27 NOTE — Progress Notes (Signed)
 Patient excited for discharge.  Due to working with therapy, patient was unable to eat lunch and was eating lunch and did not want to go to DC lounge at this time.  Patient waiting for PTAR.  Management aware and involved

## 2024-03-27 NOTE — Plan of Care (Signed)

## 2024-03-27 NOTE — Plan of Care (Signed)
  Problem: Education: Goal: Knowledge of General Education information will improve Description: Including pain rating scale, medication(s)/side effects and non-pharmacologic comfort measures Outcome: Progressing   Problem: Health Behavior/Discharge Planning: Goal: Ability to manage health-related needs will improve Outcome: Progressing   Problem: Clinical Measurements: Goal: Ability to maintain clinical measurements within normal limits will improve Outcome: Progressing   Problem: Activity: Goal: Risk for activity intolerance will decrease Outcome: Progressing   Problem: Coping: Goal: Level of anxiety will decrease Outcome: Progressing   Problem: Pain Managment: Goal: General experience of comfort will improve and/or be controlled Outcome: Progressing

## 2024-03-27 NOTE — TOC Progression Note (Addendum)
 Transition of Care [facility]) - Progression Note    Patient Details  Name: MANUAL NAVARRA MRN: [id number] Date of Birth: 1941/08/04  [facility]) CM/SW Contact  Elspeth Hals, LCSW Phone Number: 03/27/2024, 8:59 AM  Clinical Narrative:   PASSR received: [id number]  CSW confirmed with [facility] that they can receive pt today.  0945: SNF auth request submitted in Nav[employer].   1320: SNF auth approved: [id number], 3 days: 6/18-6/20.  [facility] can still receive pt today.     Expected Discharge Plan: Skilled Nursing Facility Barriers to Discharge: Continued Medical Work up, SNF Pending bed offer  Expected Discharge Plan and Services In-house Referral: Clinical Social Work   Post Acute Care Choice: Skilled Nursing Facility Living arrangements for the past 2 months: Single Family Home                                       Social Determinants of Health (SDOH) Interventions SDOH Screenings   Food Insecurity: No Food Insecurity (03/23/2024)  Housing: Unknown (03/23/2024)  Transportation Needs: No Transportation Needs (03/24/2024)  Utilities: Not At Risk (03/24/2024)  Social Connections: Moderately Integrated (03/24/2024)  Tobacco Use: Low Risk  (03/24/2024)    Readmission Risk Interventions     No data to display

## 2024-03-27 NOTE — TOC Transition Note (Signed)
 Transition of Care [facility]) - Discharge Note   Patient Details  Name: JOURNEY RATTERMAN MRN: [id number] Date of Birth: [birth year]  Transition of Care [facility]) CM/SW Contact:  Elspeth Hals, LCSW Phone Number: 03/27/2024, 2:02 PM   Clinical Narrative:   Pt discharging to [facility], room 602.  RN call report to [phone].  PTAR called 1400.      Final next level of care: Skilled Nursing Facility Barriers to Discharge: Barriers Resolved   Patient Goals and CMS Choice Patient states their goals for this hospitalization and ongoing recovery are:: walk, drive   Choice offered to / list presented to : Patient (requesting [facility])      Discharge Placement              Patient chooses bed at: [facility] [place] Patient to be transferred to facility by: ptar Name of family member notified: daughter Edwina Gram Patient and family notified of of transfer: 03/27/24  Discharge Plan and Services Additional resources added to the After Visit Summary for   In-house Referral: Clinical Social Work   Post Acute Care Choice: Skilled Nursing Facility                               Social Drivers of Health (SDOH) Interventions SDOH Screenings   Food Insecurity: No Food Insecurity (03/23/2024)  Housing: Unknown (03/23/2024)  Transportation Needs: No Transportation Needs (03/24/2024)  Utilities: Not At Risk (03/24/2024)  Social Connections: Moderately Integrated (03/24/2024)  Tobacco Use: Low Risk  (03/24/2024)     Readmission Risk Interventions     No data to display

## 2024-03-27 NOTE — Evaluation (Signed)
 Occupational Therapy Evaluation Patient Details Name: Matthew Liu MRN: [id number] DOB: Feb 24, 1941 Today's Date: 03/27/2024   History of Present Illness   83 y.o. male who now presents with severe right hip pain after a fall, and ORIF 6/15; WBAT; with medical history significant for CAD status post CABG, IPF, OSA, history of CVA, Parkinson disease with mild cognitive impairment, and left hip fracture status postsurgical repair on 02/19/2024     Clinical Impressions At baseline, pt is Independent with ADLs and IADLs, performs functional mobility without an AD, and drives. Since hospitalization in 02/2024, has been using a RW for functional mobility and reports completing ADLs and IADLs Independent to Mod I just prior to this admission. Pt is the primary caregiver for his wife who has dementia. Pt now presents with decreased activity tolerance, decreased balance, pain in R hip affecting functional level, decreased B UE strength, and decreased safety and independence with functional tasks. Pt currently demonstrates ability to complete UB ADLs Independent to Contact guard assist, LB ADLs with Mod to Max assist, and functional mobility/transfers with a RW with Min assist. Pt participated well in session and is motivated to participate in skilled rehab services in order to return home with his wife. Pt will benefit from acute skilled OT services to address deficits outlined below and increase safety and independence with functional tasks. Post acute discharge, pt will benefit from intensive inpatient skilled rehab services < 3 hours per day to maximize rehab potential.      If plan is discharge home, recommend the following:   A little help with walking and/or transfers;A lot of help with bathing/dressing/bathroom;Assistance with cooking/housework;Assist for transportation;Help with stairs or ramp for entrance     Functional Status Assessment   Patient has had a recent decline in their functional  status and demonstrates the ability to make significant improvements in function in a reasonable and predictable amount of time.     Equipment Recommendations   None recommended by OT (Pt already has needed equipment)     Recommendations for Other Services         Precautions/Restrictions   Precautions Precautions: Fall Recall of Precautions/Restrictions: Intact Restrictions Weight Bearing Restrictions Per Provider Order: No RLE Weight Bearing Per Provider Order: Weight bearing as tolerated     Mobility Bed Mobility Overal bed mobility: Needs Assistance Bed Mobility: Supine to Sit     Supine to sit: Mod assist, HOB elevated, Used rails     General bed mobility comments: to EOB and pull to sit; incr time and effort; assist to maange R LE    Transfers Overall transfer level: Needs assistance Equipment used: Rolling walker (2 wheels) Transfers: Sit to/from Stand, Bed to chair/wheelchair/BSC Sit to Stand: Min assist     Step pivot transfers: Min assist            Balance Overall balance assessment: Needs assistance Sitting-balance support: Single extremity supported, No upper extremity supported, Feet supported Sitting balance-Leahy Scale: Fair     Standing balance support: Bilateral upper extremity supported, Single extremity supported, During functional activity, Reliant on assistive device for balance Standing balance-Leahy Scale: Poor                             ADL either performed or assessed with clinical judgement   ADL Overall ADL's : Needs assistance/impaired Eating/Feeding: Independent;Sitting   Grooming: Set up;Supervision/safety;Sitting   Upper Body Bathing: Supervision/ safety;Contact guard assist;Sitting   Lower  Body Bathing: Moderate assistance;Sitting/lateral leans;Maximal assistance;Sit to/from stand;Cueing for compensatory techniques;Cueing for safety   Upper Body Dressing : Contact guard assist;Sitting   Lower Body  Dressing: Moderate assistance;Sitting/lateral leans;Maximal assistance;Sit to/from stand;Cueing for safety;Cueing for compensatory techniques   Toilet Transfer: Minimal assistance;Rolling walker (2 wheels);BSC/3in1;Cueing for safety Toilet Transfer Details (indicate cue type and reason): simulated bed to chair Toileting- Clothing Manipulation and Hygiene: Maximal assistance;Sit to/from stand       Functional mobility during ADLs: Contact guard assist;Minimal assistance;Rolling walker (2 wheels) (with increased time and effort, approx 20 feet in room) General ADL Comments: Pt with decreased activity tolerance and requiring increased time and effort for tasks. Pt may benefit from instruciton in use of AE for LB ADLs.     Vision Ability to See in Adequate Light: 0 Adequate Patient Visual Report: No change from baseline       Perception         Praxis         Pertinent Vitals/Pain Pain Assessment Pain Assessment: Faces Faces Pain Scale: Hurts little more Pain Location: R hip Pain Descriptors / Indicators: Grimacing, Guarding Pain Intervention(s): Monitored during session     Extremity/Trunk Assessment Upper Extremity Assessment Upper Extremity Assessment: Right hand dominant;Generalized weakness   Lower Extremity Assessment Lower Extremity Assessment: Defer to PT evaluation   Cervical / Trunk Assessment Cervical / Trunk Assessment: Kyphotic   Communication Communication Communication: Impaired Factors Affecting Communication: Hearing impaired;Other (comment) (requires mildly increased time for processing and word finding)   Cognition Arousal: Alert Behavior During Therapy: WFL for tasks assessed/performed Cognition: History of cognitive impairments, Cognition impaired (History of mild neurocognitive disorder due to Parkinson's disease)     Awareness: Intellectual awareness intact, Online awareness intact Memory impairment (select all impairments): Working [employer] fast streamer  (requires mildly increased time for declarative long-term memory) Attention impairment (select first level of impairment): Alternating attention Executive functioning impairment (select all impairments): Organization, Problem solving OT - Cognition Comments: Pt AAOx4 and pleasant throughout session. Pt cognition largely [facility] for tasks assessed but with deficits noted.                 Following commands: Intact       Cueing  General Comments   Cueing Techniques: Verbal cues;Gestural cues;Tactile cues;Visual cues  Pt is motivated to participate in skilled rehab to be able to return home with his wife as soon as possible   Exercises     Shoulder Instructions      Home Living Family/patient expects to be discharged to:: Private residence Living Arrangements: Spouse/significant other Available Help at Discharge: Family;Available PRN/intermittently Type of Home: House Home Access: Ramped entrance     Home Layout: One level     Bathroom Shower/Tub: Chief Strategy Officer: Standard Bathroom Accessibility: Yes How Accessible: Accessible via walker Home Equipment: Rolling Walker (2 wheels);Cane - single point;Wheelchair - manual;Shower seat;Grab bars - tub/shower   Additional Comments: Pt is the main caregiver for his wife who has dementia      Prior Functioning/Environment Prior Level of Function : Independent/Modified Independent;Driving (prior to fall with L hip fx in 02/2024)             Mobility Comments: At baseline, pt is Independent with funcitonal mobility withotu an AD; has been using a RW since fall with L hip fx in 02/2024 ADLs Comments: At baseline, pt is Independent with ADLs and IADLs and drives. Pt is the primary caregiver for is wife who has dementia. Pt received  rehab services post fall with L hip fx in 02/2024. Just prior to this admission,  pt reports performing ADLs and IADL Independent to Mod I.    OT Problem List: Decreased activity  tolerance;Impaired balance (sitting and/or standing)   OT Treatment/Interventions: Self-care/ADL training;Energy conservation;DME and/or AE instruction;Therapeutic activities;Patient/family education;Balance training      OT Goals(Current goals can be found in the care plan section)   Acute Rehab OT Goals Patient Stated Goal: to return home with his wife OT Goal Formulation: With patient Time For Goal Achievement: 04/10/24 Potential to Achieve Goals: Good ADL Goals Pt Will Perform Lower Body Bathing: with contact guard assist;with adaptive equipment;sitting/lateral leans;sit to/from stand Pt Will Perform Lower Body Dressing: with contact guard assist;with adaptive equipment;sitting/lateral leans;sit to/from stand Pt Will Transfer to Toilet: with supervision;ambulating;regular height toilet;grab bars (with lease restrictive AD) Pt Will Perform Toileting - Clothing Manipulation and hygiene: with contact guard assist;sitting/lateral leans;sit to/from stand Pt/caregiver will Perform Home Exercise Program: Increased strength;Both right and left upper extremity;With theraband;With written HEP provided;Independently   OT Frequency:  Min 2X/week    Co-evaluation              AM-PAC OT 6 Clicks Daily Activity     Outcome Measure Help from another person eating meals?: None Help from another person taking care of personal grooming?: A Little Help from another person toileting, which includes using toliet, bedpan, or urinal?: A Lot Help from another person bathing (including washing, rinsing, drying)?: A Lot Help from another person to put on and taking off regular upper body clothing?: A Little Help from another person to put on and taking off regular lower body clothing?: A Lot 6 Click Score: 16   End of Session Equipment Utilized During Treatment: Gait belt;Rolling walker (2 wheels) Nurse Communication: Mobility status  Activity Tolerance: Patient tolerated treatment  well Patient left: in chair;with call bell/phone within reach;with chair alarm set  OT Visit Diagnosis: Unsteadiness on feet (R26.81);Other abnormalities of gait and mobility (R26.89);Repeated falls (R29.6);History of falling (Z91.81);Other (comment) (decreased activity tolerance)                Time: 1610-9604 OT Time Calculation (min): 21 min Charges:  OT General Charges $OT Visit: 1 Visit OT Evaluation $OT Eval Moderate Complexity: 1 Mod  Ronnell Coins., OTR/L, MA Acute Rehab [phone]   Walt Gunner 03/27/2024, 12:35 PM

## 2024-03-29 DIAGNOSIS — R262 Difficulty in walking, not elsewhere classified: Secondary | ICD-10-CM | POA: Diagnosis not present

## 2024-03-29 DIAGNOSIS — D62 Acute posthemorrhagic anemia: Secondary | ICD-10-CM | POA: Diagnosis not present

## 2024-03-29 DIAGNOSIS — S72001D Fracture of unspecified part of neck of right femur, subsequent encounter for closed fracture with routine healing: Secondary | ICD-10-CM | POA: Diagnosis not present

## 2024-03-29 DIAGNOSIS — G8918 Other acute postprocedural pain: Secondary | ICD-10-CM | POA: Diagnosis not present

## 2024-04-03 DIAGNOSIS — L988 Other specified disorders of the skin and subcutaneous tissue: Secondary | ICD-10-CM | POA: Diagnosis not present

## 2024-04-03 DIAGNOSIS — L89322 Pressure ulcer of left buttock, stage 2: Secondary | ICD-10-CM | POA: Diagnosis not present

## 2024-04-08 DIAGNOSIS — I739 Peripheral vascular disease, unspecified: Secondary | ICD-10-CM | POA: Diagnosis not present

## 2024-04-08 DIAGNOSIS — G20A1 Parkinson's disease without dyskinesia, without mention of fluctuations: Secondary | ICD-10-CM | POA: Diagnosis not present

## 2024-04-08 DIAGNOSIS — M80052D Age-related osteoporosis with current pathological fracture, left femur, subsequent encounter for fracture with routine healing: Secondary | ICD-10-CM | POA: Diagnosis not present

## 2024-04-08 DIAGNOSIS — M80051D Age-related osteoporosis with current pathological fracture, right femur, subsequent encounter for fracture with routine healing: Secondary | ICD-10-CM | POA: Diagnosis not present

## 2024-04-08 DIAGNOSIS — N1831 Chronic kidney disease, stage 3a: Secondary | ICD-10-CM | POA: Diagnosis not present

## 2024-04-08 DIAGNOSIS — I5022 Chronic systolic (congestive) heart failure: Secondary | ICD-10-CM | POA: Diagnosis not present

## 2024-04-08 DIAGNOSIS — I130 Hypertensive heart and chronic kidney disease with heart failure and stage 1 through stage 4 chronic kidney disease, or unspecified chronic kidney disease: Secondary | ICD-10-CM | POA: Diagnosis not present

## 2024-04-08 DIAGNOSIS — F0670 Mild neurocognitive disorder due to known physiological condition without behavioral disturbance: Secondary | ICD-10-CM | POA: Diagnosis not present

## 2024-04-08 DIAGNOSIS — D631 Anemia in chronic kidney disease: Secondary | ICD-10-CM | POA: Diagnosis not present

## 2024-04-09 DIAGNOSIS — N1831 Chronic kidney disease, stage 3a: Secondary | ICD-10-CM | POA: Diagnosis not present

## 2024-04-09 DIAGNOSIS — D631 Anemia in chronic kidney disease: Secondary | ICD-10-CM | POA: Diagnosis not present

## 2024-04-09 DIAGNOSIS — I5022 Chronic systolic (congestive) heart failure: Secondary | ICD-10-CM | POA: Diagnosis not present

## 2024-04-09 DIAGNOSIS — I739 Peripheral vascular disease, unspecified: Secondary | ICD-10-CM | POA: Diagnosis not present

## 2024-04-09 DIAGNOSIS — I130 Hypertensive heart and chronic kidney disease with heart failure and stage 1 through stage 4 chronic kidney disease, or unspecified chronic kidney disease: Secondary | ICD-10-CM | POA: Diagnosis not present

## 2024-04-09 DIAGNOSIS — M80051D Age-related osteoporosis with current pathological fracture, right femur, subsequent encounter for fracture with routine healing: Secondary | ICD-10-CM | POA: Diagnosis not present

## 2024-04-09 DIAGNOSIS — F0670 Mild neurocognitive disorder due to known physiological condition without behavioral disturbance: Secondary | ICD-10-CM | POA: Diagnosis not present

## 2024-04-09 DIAGNOSIS — G20A1 Parkinson's disease without dyskinesia, without mention of fluctuations: Secondary | ICD-10-CM | POA: Diagnosis not present

## 2024-04-09 DIAGNOSIS — M80052D Age-related osteoporosis with current pathological fracture, left femur, subsequent encounter for fracture with routine healing: Secondary | ICD-10-CM | POA: Diagnosis not present

## 2024-04-09 DIAGNOSIS — S7221XD Displaced subtrochanteric fracture of right femur, subsequent encounter for closed fracture with routine healing: Secondary | ICD-10-CM | POA: Diagnosis not present

## 2024-04-11 DIAGNOSIS — I130 Hypertensive heart and chronic kidney disease with heart failure and stage 1 through stage 4 chronic kidney disease, or unspecified chronic kidney disease: Secondary | ICD-10-CM | POA: Diagnosis not present

## 2024-04-11 DIAGNOSIS — I739 Peripheral vascular disease, unspecified: Secondary | ICD-10-CM | POA: Diagnosis not present

## 2024-04-11 DIAGNOSIS — M80051D Age-related osteoporosis with current pathological fracture, right femur, subsequent encounter for fracture with routine healing: Secondary | ICD-10-CM | POA: Diagnosis not present

## 2024-04-11 DIAGNOSIS — G20A1 Parkinson's disease without dyskinesia, without mention of fluctuations: Secondary | ICD-10-CM | POA: Diagnosis not present

## 2024-04-11 DIAGNOSIS — D631 Anemia in chronic kidney disease: Secondary | ICD-10-CM | POA: Diagnosis not present

## 2024-04-11 DIAGNOSIS — F0670 Mild neurocognitive disorder due to known physiological condition without behavioral disturbance: Secondary | ICD-10-CM | POA: Diagnosis not present

## 2024-04-11 DIAGNOSIS — M80052D Age-related osteoporosis with current pathological fracture, left femur, subsequent encounter for fracture with routine healing: Secondary | ICD-10-CM | POA: Diagnosis not present

## 2024-04-11 DIAGNOSIS — N1831 Chronic kidney disease, stage 3a: Secondary | ICD-10-CM | POA: Diagnosis not present

## 2024-04-11 DIAGNOSIS — I5022 Chronic systolic (congestive) heart failure: Secondary | ICD-10-CM | POA: Diagnosis not present

## 2024-04-15 DIAGNOSIS — M80051D Age-related osteoporosis with current pathological fracture, right femur, subsequent encounter for fracture with routine healing: Secondary | ICD-10-CM | POA: Diagnosis not present

## 2024-04-15 DIAGNOSIS — N1831 Chronic kidney disease, stage 3a: Secondary | ICD-10-CM | POA: Diagnosis not present

## 2024-04-15 DIAGNOSIS — F0670 Mild neurocognitive disorder due to known physiological condition without behavioral disturbance: Secondary | ICD-10-CM | POA: Diagnosis not present

## 2024-04-15 DIAGNOSIS — M80052D Age-related osteoporosis with current pathological fracture, left femur, subsequent encounter for fracture with routine healing: Secondary | ICD-10-CM | POA: Diagnosis not present

## 2024-04-15 DIAGNOSIS — G20A1 Parkinson's disease without dyskinesia, without mention of fluctuations: Secondary | ICD-10-CM | POA: Diagnosis not present

## 2024-04-15 DIAGNOSIS — D631 Anemia in chronic kidney disease: Secondary | ICD-10-CM | POA: Diagnosis not present

## 2024-04-15 DIAGNOSIS — I5022 Chronic systolic (congestive) heart failure: Secondary | ICD-10-CM | POA: Diagnosis not present

## 2024-04-15 DIAGNOSIS — I739 Peripheral vascular disease, unspecified: Secondary | ICD-10-CM | POA: Diagnosis not present

## 2024-04-15 DIAGNOSIS — I130 Hypertensive heart and chronic kidney disease with heart failure and stage 1 through stage 4 chronic kidney disease, or unspecified chronic kidney disease: Secondary | ICD-10-CM | POA: Diagnosis not present

## 2024-04-17 DIAGNOSIS — S72001A Fracture of unspecified part of neck of right femur, initial encounter for closed fracture: Secondary | ICD-10-CM | POA: Diagnosis not present

## 2024-04-17 DIAGNOSIS — M25551 Pain in right hip: Secondary | ICD-10-CM | POA: Diagnosis not present

## 2024-04-17 DIAGNOSIS — D631 Anemia in chronic kidney disease: Secondary | ICD-10-CM | POA: Diagnosis not present

## 2024-04-17 DIAGNOSIS — G20A1 Parkinson's disease without dyskinesia, without mention of fluctuations: Secondary | ICD-10-CM | POA: Diagnosis not present

## 2024-04-17 DIAGNOSIS — I10 Essential (primary) hypertension: Secondary | ICD-10-CM | POA: Diagnosis not present

## 2024-04-17 DIAGNOSIS — I5022 Chronic systolic (congestive) heart failure: Secondary | ICD-10-CM | POA: Diagnosis not present

## 2024-04-17 DIAGNOSIS — I739 Peripheral vascular disease, unspecified: Secondary | ICD-10-CM | POA: Diagnosis not present

## 2024-04-17 DIAGNOSIS — Z9861 Coronary angioplasty status: Secondary | ICD-10-CM | POA: Diagnosis not present

## 2024-04-17 DIAGNOSIS — I2510 Atherosclerotic heart disease of native coronary artery without angina pectoris: Secondary | ICD-10-CM | POA: Diagnosis not present

## 2024-04-17 DIAGNOSIS — F0670 Mild neurocognitive disorder due to known physiological condition without behavioral disturbance: Secondary | ICD-10-CM | POA: Diagnosis not present

## 2024-04-17 DIAGNOSIS — I130 Hypertensive heart and chronic kidney disease with heart failure and stage 1 through stage 4 chronic kidney disease, or unspecified chronic kidney disease: Secondary | ICD-10-CM | POA: Diagnosis not present

## 2024-04-17 DIAGNOSIS — D649 Anemia, unspecified: Secondary | ICD-10-CM | POA: Diagnosis not present

## 2024-04-17 DIAGNOSIS — N1831 Chronic kidney disease, stage 3a: Secondary | ICD-10-CM | POA: Diagnosis not present

## 2024-04-17 DIAGNOSIS — M80052D Age-related osteoporosis with current pathological fracture, left femur, subsequent encounter for fracture with routine healing: Secondary | ICD-10-CM | POA: Diagnosis not present

## 2024-04-17 DIAGNOSIS — M80051D Age-related osteoporosis with current pathological fracture, right femur, subsequent encounter for fracture with routine healing: Secondary | ICD-10-CM | POA: Diagnosis not present

## 2024-04-18 DIAGNOSIS — M80052D Age-related osteoporosis with current pathological fracture, left femur, subsequent encounter for fracture with routine healing: Secondary | ICD-10-CM | POA: Diagnosis not present

## 2024-04-18 DIAGNOSIS — I5022 Chronic systolic (congestive) heart failure: Secondary | ICD-10-CM | POA: Diagnosis not present

## 2024-04-18 DIAGNOSIS — D631 Anemia in chronic kidney disease: Secondary | ICD-10-CM | POA: Diagnosis not present

## 2024-04-18 DIAGNOSIS — M80051D Age-related osteoporosis with current pathological fracture, right femur, subsequent encounter for fracture with routine healing: Secondary | ICD-10-CM | POA: Diagnosis not present

## 2024-04-18 DIAGNOSIS — N1831 Chronic kidney disease, stage 3a: Secondary | ICD-10-CM | POA: Diagnosis not present

## 2024-04-18 DIAGNOSIS — F0670 Mild neurocognitive disorder due to known physiological condition without behavioral disturbance: Secondary | ICD-10-CM | POA: Diagnosis not present

## 2024-04-18 DIAGNOSIS — G20A1 Parkinson's disease without dyskinesia, without mention of fluctuations: Secondary | ICD-10-CM | POA: Diagnosis not present

## 2024-04-18 DIAGNOSIS — I130 Hypertensive heart and chronic kidney disease with heart failure and stage 1 through stage 4 chronic kidney disease, or unspecified chronic kidney disease: Secondary | ICD-10-CM | POA: Diagnosis not present

## 2024-04-18 DIAGNOSIS — I739 Peripheral vascular disease, unspecified: Secondary | ICD-10-CM | POA: Diagnosis not present

## 2024-04-22 DIAGNOSIS — D631 Anemia in chronic kidney disease: Secondary | ICD-10-CM | POA: Diagnosis not present

## 2024-04-22 DIAGNOSIS — I5022 Chronic systolic (congestive) heart failure: Secondary | ICD-10-CM | POA: Diagnosis not present

## 2024-04-22 DIAGNOSIS — I130 Hypertensive heart and chronic kidney disease with heart failure and stage 1 through stage 4 chronic kidney disease, or unspecified chronic kidney disease: Secondary | ICD-10-CM | POA: Diagnosis not present

## 2024-04-22 DIAGNOSIS — N1831 Chronic kidney disease, stage 3a: Secondary | ICD-10-CM | POA: Diagnosis not present

## 2024-04-22 DIAGNOSIS — M80051D Age-related osteoporosis with current pathological fracture, right femur, subsequent encounter for fracture with routine healing: Secondary | ICD-10-CM | POA: Diagnosis not present

## 2024-04-22 DIAGNOSIS — G20A1 Parkinson's disease without dyskinesia, without mention of fluctuations: Secondary | ICD-10-CM | POA: Diagnosis not present

## 2024-04-22 DIAGNOSIS — S72001A Fracture of unspecified part of neck of right femur, initial encounter for closed fracture: Secondary | ICD-10-CM | POA: Diagnosis not present

## 2024-04-22 DIAGNOSIS — M80052D Age-related osteoporosis with current pathological fracture, left femur, subsequent encounter for fracture with routine healing: Secondary | ICD-10-CM | POA: Diagnosis not present

## 2024-04-22 DIAGNOSIS — F0670 Mild neurocognitive disorder due to known physiological condition without behavioral disturbance: Secondary | ICD-10-CM | POA: Diagnosis not present

## 2024-04-22 DIAGNOSIS — I739 Peripheral vascular disease, unspecified: Secondary | ICD-10-CM | POA: Diagnosis not present

## 2024-04-23 DIAGNOSIS — F0670 Mild neurocognitive disorder due to known physiological condition without behavioral disturbance: Secondary | ICD-10-CM | POA: Diagnosis not present

## 2024-04-23 DIAGNOSIS — I130 Hypertensive heart and chronic kidney disease with heart failure and stage 1 through stage 4 chronic kidney disease, or unspecified chronic kidney disease: Secondary | ICD-10-CM | POA: Diagnosis not present

## 2024-04-23 DIAGNOSIS — N1831 Chronic kidney disease, stage 3a: Secondary | ICD-10-CM | POA: Diagnosis not present

## 2024-04-23 DIAGNOSIS — I5022 Chronic systolic (congestive) heart failure: Secondary | ICD-10-CM | POA: Diagnosis not present

## 2024-04-23 DIAGNOSIS — M80052D Age-related osteoporosis with current pathological fracture, left femur, subsequent encounter for fracture with routine healing: Secondary | ICD-10-CM | POA: Diagnosis not present

## 2024-04-23 DIAGNOSIS — I739 Peripheral vascular disease, unspecified: Secondary | ICD-10-CM | POA: Diagnosis not present

## 2024-04-23 DIAGNOSIS — M80051D Age-related osteoporosis with current pathological fracture, right femur, subsequent encounter for fracture with routine healing: Secondary | ICD-10-CM | POA: Diagnosis not present

## 2024-04-23 DIAGNOSIS — D631 Anemia in chronic kidney disease: Secondary | ICD-10-CM | POA: Diagnosis not present

## 2024-04-23 DIAGNOSIS — G20A1 Parkinson's disease without dyskinesia, without mention of fluctuations: Secondary | ICD-10-CM | POA: Diagnosis not present

## 2024-04-24 DIAGNOSIS — M80051D Age-related osteoporosis with current pathological fracture, right femur, subsequent encounter for fracture with routine healing: Secondary | ICD-10-CM | POA: Diagnosis not present

## 2024-04-24 DIAGNOSIS — I739 Peripheral vascular disease, unspecified: Secondary | ICD-10-CM | POA: Diagnosis not present

## 2024-04-24 DIAGNOSIS — F0670 Mild neurocognitive disorder due to known physiological condition without behavioral disturbance: Secondary | ICD-10-CM | POA: Diagnosis not present

## 2024-04-24 DIAGNOSIS — D631 Anemia in chronic kidney disease: Secondary | ICD-10-CM | POA: Diagnosis not present

## 2024-04-24 DIAGNOSIS — S7221XD Displaced subtrochanteric fracture of right femur, subsequent encounter for closed fracture with routine healing: Secondary | ICD-10-CM | POA: Diagnosis not present

## 2024-04-24 DIAGNOSIS — I5022 Chronic systolic (congestive) heart failure: Secondary | ICD-10-CM | POA: Diagnosis not present

## 2024-04-24 DIAGNOSIS — I130 Hypertensive heart and chronic kidney disease with heart failure and stage 1 through stage 4 chronic kidney disease, or unspecified chronic kidney disease: Secondary | ICD-10-CM | POA: Diagnosis not present

## 2024-04-24 DIAGNOSIS — G20A1 Parkinson's disease without dyskinesia, without mention of fluctuations: Secondary | ICD-10-CM | POA: Diagnosis not present

## 2024-04-24 DIAGNOSIS — M80052D Age-related osteoporosis with current pathological fracture, left femur, subsequent encounter for fracture with routine healing: Secondary | ICD-10-CM | POA: Diagnosis not present

## 2024-04-24 DIAGNOSIS — N1831 Chronic kidney disease, stage 3a: Secondary | ICD-10-CM | POA: Diagnosis not present

## 2024-04-25 DIAGNOSIS — D631 Anemia in chronic kidney disease: Secondary | ICD-10-CM | POA: Diagnosis not present

## 2024-04-25 DIAGNOSIS — I130 Hypertensive heart and chronic kidney disease with heart failure and stage 1 through stage 4 chronic kidney disease, or unspecified chronic kidney disease: Secondary | ICD-10-CM | POA: Diagnosis not present

## 2024-04-25 DIAGNOSIS — F0670 Mild neurocognitive disorder due to known physiological condition without behavioral disturbance: Secondary | ICD-10-CM | POA: Diagnosis not present

## 2024-04-25 DIAGNOSIS — I739 Peripheral vascular disease, unspecified: Secondary | ICD-10-CM | POA: Diagnosis not present

## 2024-04-25 DIAGNOSIS — M80052D Age-related osteoporosis with current pathological fracture, left femur, subsequent encounter for fracture with routine healing: Secondary | ICD-10-CM | POA: Diagnosis not present

## 2024-04-25 DIAGNOSIS — N1831 Chronic kidney disease, stage 3a: Secondary | ICD-10-CM | POA: Diagnosis not present

## 2024-04-25 DIAGNOSIS — M80051D Age-related osteoporosis with current pathological fracture, right femur, subsequent encounter for fracture with routine healing: Secondary | ICD-10-CM | POA: Diagnosis not present

## 2024-04-25 DIAGNOSIS — G20A1 Parkinson's disease without dyskinesia, without mention of fluctuations: Secondary | ICD-10-CM | POA: Diagnosis not present

## 2024-04-25 DIAGNOSIS — I5022 Chronic systolic (congestive) heart failure: Secondary | ICD-10-CM | POA: Diagnosis not present

## 2024-04-29 DIAGNOSIS — I5022 Chronic systolic (congestive) heart failure: Secondary | ICD-10-CM | POA: Diagnosis not present

## 2024-04-29 DIAGNOSIS — M80052D Age-related osteoporosis with current pathological fracture, left femur, subsequent encounter for fracture with routine healing: Secondary | ICD-10-CM | POA: Diagnosis not present

## 2024-04-29 DIAGNOSIS — I130 Hypertensive heart and chronic kidney disease with heart failure and stage 1 through stage 4 chronic kidney disease, or unspecified chronic kidney disease: Secondary | ICD-10-CM | POA: Diagnosis not present

## 2024-04-29 DIAGNOSIS — F0670 Mild neurocognitive disorder due to known physiological condition without behavioral disturbance: Secondary | ICD-10-CM | POA: Diagnosis not present

## 2024-04-29 DIAGNOSIS — I739 Peripheral vascular disease, unspecified: Secondary | ICD-10-CM | POA: Diagnosis not present

## 2024-04-29 DIAGNOSIS — G20A1 Parkinson's disease without dyskinesia, without mention of fluctuations: Secondary | ICD-10-CM | POA: Diagnosis not present

## 2024-04-29 DIAGNOSIS — M80051D Age-related osteoporosis with current pathological fracture, right femur, subsequent encounter for fracture with routine healing: Secondary | ICD-10-CM | POA: Diagnosis not present

## 2024-04-29 DIAGNOSIS — N1831 Chronic kidney disease, stage 3a: Secondary | ICD-10-CM | POA: Diagnosis not present

## 2024-04-29 DIAGNOSIS — D631 Anemia in chronic kidney disease: Secondary | ICD-10-CM | POA: Diagnosis not present

## 2024-05-01 DIAGNOSIS — N1831 Chronic kidney disease, stage 3a: Secondary | ICD-10-CM | POA: Diagnosis not present

## 2024-05-01 DIAGNOSIS — I130 Hypertensive heart and chronic kidney disease with heart failure and stage 1 through stage 4 chronic kidney disease, or unspecified chronic kidney disease: Secondary | ICD-10-CM | POA: Diagnosis not present

## 2024-05-01 DIAGNOSIS — M80051D Age-related osteoporosis with current pathological fracture, right femur, subsequent encounter for fracture with routine healing: Secondary | ICD-10-CM | POA: Diagnosis not present

## 2024-05-01 DIAGNOSIS — D631 Anemia in chronic kidney disease: Secondary | ICD-10-CM | POA: Diagnosis not present

## 2024-05-01 DIAGNOSIS — M80052D Age-related osteoporosis with current pathological fracture, left femur, subsequent encounter for fracture with routine healing: Secondary | ICD-10-CM | POA: Diagnosis not present

## 2024-05-01 DIAGNOSIS — G20A1 Parkinson's disease without dyskinesia, without mention of fluctuations: Secondary | ICD-10-CM | POA: Diagnosis not present

## 2024-05-01 DIAGNOSIS — I739 Peripheral vascular disease, unspecified: Secondary | ICD-10-CM | POA: Diagnosis not present

## 2024-05-01 DIAGNOSIS — F0670 Mild neurocognitive disorder due to known physiological condition without behavioral disturbance: Secondary | ICD-10-CM | POA: Diagnosis not present

## 2024-05-01 DIAGNOSIS — I5022 Chronic systolic (congestive) heart failure: Secondary | ICD-10-CM | POA: Diagnosis not present

## 2024-05-02 DIAGNOSIS — N1831 Chronic kidney disease, stage 3a: Secondary | ICD-10-CM | POA: Diagnosis not present

## 2024-05-02 DIAGNOSIS — D631 Anemia in chronic kidney disease: Secondary | ICD-10-CM | POA: Diagnosis not present

## 2024-05-02 DIAGNOSIS — I5022 Chronic systolic (congestive) heart failure: Secondary | ICD-10-CM | POA: Diagnosis not present

## 2024-05-02 DIAGNOSIS — I130 Hypertensive heart and chronic kidney disease with heart failure and stage 1 through stage 4 chronic kidney disease, or unspecified chronic kidney disease: Secondary | ICD-10-CM | POA: Diagnosis not present

## 2024-05-02 DIAGNOSIS — M80051D Age-related osteoporosis with current pathological fracture, right femur, subsequent encounter for fracture with routine healing: Secondary | ICD-10-CM | POA: Diagnosis not present

## 2024-05-02 DIAGNOSIS — I739 Peripheral vascular disease, unspecified: Secondary | ICD-10-CM | POA: Diagnosis not present

## 2024-05-02 DIAGNOSIS — M80052D Age-related osteoporosis with current pathological fracture, left femur, subsequent encounter for fracture with routine healing: Secondary | ICD-10-CM | POA: Diagnosis not present

## 2024-05-02 DIAGNOSIS — G20A1 Parkinson's disease without dyskinesia, without mention of fluctuations: Secondary | ICD-10-CM | POA: Diagnosis not present

## 2024-05-02 DIAGNOSIS — F0670 Mild neurocognitive disorder due to known physiological condition without behavioral disturbance: Secondary | ICD-10-CM | POA: Diagnosis not present

## 2024-05-06 DIAGNOSIS — G20A1 Parkinson's disease without dyskinesia, without mention of fluctuations: Secondary | ICD-10-CM | POA: Diagnosis not present

## 2024-05-06 DIAGNOSIS — I130 Hypertensive heart and chronic kidney disease with heart failure and stage 1 through stage 4 chronic kidney disease, or unspecified chronic kidney disease: Secondary | ICD-10-CM | POA: Diagnosis not present

## 2024-05-06 DIAGNOSIS — F0670 Mild neurocognitive disorder due to known physiological condition without behavioral disturbance: Secondary | ICD-10-CM | POA: Diagnosis not present

## 2024-05-06 DIAGNOSIS — N1831 Chronic kidney disease, stage 3a: Secondary | ICD-10-CM | POA: Diagnosis not present

## 2024-05-06 DIAGNOSIS — M80051D Age-related osteoporosis with current pathological fracture, right femur, subsequent encounter for fracture with routine healing: Secondary | ICD-10-CM | POA: Diagnosis not present

## 2024-05-06 DIAGNOSIS — I5022 Chronic systolic (congestive) heart failure: Secondary | ICD-10-CM | POA: Diagnosis not present

## 2024-05-06 DIAGNOSIS — D631 Anemia in chronic kidney disease: Secondary | ICD-10-CM | POA: Diagnosis not present

## 2024-05-06 DIAGNOSIS — M80052D Age-related osteoporosis with current pathological fracture, left femur, subsequent encounter for fracture with routine healing: Secondary | ICD-10-CM | POA: Diagnosis not present

## 2024-05-06 DIAGNOSIS — I739 Peripheral vascular disease, unspecified: Secondary | ICD-10-CM | POA: Diagnosis not present

## 2024-05-08 DIAGNOSIS — I130 Hypertensive heart and chronic kidney disease with heart failure and stage 1 through stage 4 chronic kidney disease, or unspecified chronic kidney disease: Secondary | ICD-10-CM | POA: Diagnosis not present

## 2024-05-08 DIAGNOSIS — I739 Peripheral vascular disease, unspecified: Secondary | ICD-10-CM | POA: Diagnosis not present

## 2024-05-08 DIAGNOSIS — I5022 Chronic systolic (congestive) heart failure: Secondary | ICD-10-CM | POA: Diagnosis not present

## 2024-05-08 DIAGNOSIS — M80052D Age-related osteoporosis with current pathological fracture, left femur, subsequent encounter for fracture with routine healing: Secondary | ICD-10-CM | POA: Diagnosis not present

## 2024-05-08 DIAGNOSIS — M80051D Age-related osteoporosis with current pathological fracture, right femur, subsequent encounter for fracture with routine healing: Secondary | ICD-10-CM | POA: Diagnosis not present

## 2024-05-08 DIAGNOSIS — N1831 Chronic kidney disease, stage 3a: Secondary | ICD-10-CM | POA: Diagnosis not present

## 2024-05-08 DIAGNOSIS — G20A1 Parkinson's disease without dyskinesia, without mention of fluctuations: Secondary | ICD-10-CM | POA: Diagnosis not present

## 2024-05-08 DIAGNOSIS — L89322 Pressure ulcer of left buttock, stage 2: Secondary | ICD-10-CM | POA: Diagnosis not present

## 2024-05-08 DIAGNOSIS — D631 Anemia in chronic kidney disease: Secondary | ICD-10-CM | POA: Diagnosis not present

## 2024-05-09 DIAGNOSIS — N1831 Chronic kidney disease, stage 3a: Secondary | ICD-10-CM | POA: Diagnosis not present

## 2024-05-09 DIAGNOSIS — M80051D Age-related osteoporosis with current pathological fracture, right femur, subsequent encounter for fracture with routine healing: Secondary | ICD-10-CM | POA: Diagnosis not present

## 2024-05-09 DIAGNOSIS — I130 Hypertensive heart and chronic kidney disease with heart failure and stage 1 through stage 4 chronic kidney disease, or unspecified chronic kidney disease: Secondary | ICD-10-CM | POA: Diagnosis not present

## 2024-05-09 DIAGNOSIS — G20A1 Parkinson's disease without dyskinesia, without mention of fluctuations: Secondary | ICD-10-CM | POA: Diagnosis not present

## 2024-05-09 DIAGNOSIS — D631 Anemia in chronic kidney disease: Secondary | ICD-10-CM | POA: Diagnosis not present

## 2024-05-09 DIAGNOSIS — L89322 Pressure ulcer of left buttock, stage 2: Secondary | ICD-10-CM | POA: Diagnosis not present

## 2024-05-09 DIAGNOSIS — M80052D Age-related osteoporosis with current pathological fracture, left femur, subsequent encounter for fracture with routine healing: Secondary | ICD-10-CM | POA: Diagnosis not present

## 2024-05-09 DIAGNOSIS — I739 Peripheral vascular disease, unspecified: Secondary | ICD-10-CM | POA: Diagnosis not present

## 2024-05-09 DIAGNOSIS — I5022 Chronic systolic (congestive) heart failure: Secondary | ICD-10-CM | POA: Diagnosis not present

## 2024-05-14 DIAGNOSIS — D631 Anemia in chronic kidney disease: Secondary | ICD-10-CM | POA: Diagnosis not present

## 2024-05-14 DIAGNOSIS — M80051D Age-related osteoporosis with current pathological fracture, right femur, subsequent encounter for fracture with routine healing: Secondary | ICD-10-CM | POA: Diagnosis not present

## 2024-05-14 DIAGNOSIS — L89322 Pressure ulcer of left buttock, stage 2: Secondary | ICD-10-CM | POA: Diagnosis not present

## 2024-05-14 DIAGNOSIS — I739 Peripheral vascular disease, unspecified: Secondary | ICD-10-CM | POA: Diagnosis not present

## 2024-05-14 DIAGNOSIS — G20A1 Parkinson's disease without dyskinesia, without mention of fluctuations: Secondary | ICD-10-CM | POA: Diagnosis not present

## 2024-05-14 DIAGNOSIS — N1831 Chronic kidney disease, stage 3a: Secondary | ICD-10-CM | POA: Diagnosis not present

## 2024-05-14 DIAGNOSIS — I130 Hypertensive heart and chronic kidney disease with heart failure and stage 1 through stage 4 chronic kidney disease, or unspecified chronic kidney disease: Secondary | ICD-10-CM | POA: Diagnosis not present

## 2024-05-14 DIAGNOSIS — M80052D Age-related osteoporosis with current pathological fracture, left femur, subsequent encounter for fracture with routine healing: Secondary | ICD-10-CM | POA: Diagnosis not present

## 2024-05-14 DIAGNOSIS — I5022 Chronic systolic (congestive) heart failure: Secondary | ICD-10-CM | POA: Diagnosis not present

## 2024-05-15 DIAGNOSIS — N1831 Chronic kidney disease, stage 3a: Secondary | ICD-10-CM | POA: Diagnosis not present

## 2024-05-15 DIAGNOSIS — G20A1 Parkinson's disease without dyskinesia, without mention of fluctuations: Secondary | ICD-10-CM | POA: Diagnosis not present

## 2024-05-15 DIAGNOSIS — I739 Peripheral vascular disease, unspecified: Secondary | ICD-10-CM | POA: Diagnosis not present

## 2024-05-15 DIAGNOSIS — I130 Hypertensive heart and chronic kidney disease with heart failure and stage 1 through stage 4 chronic kidney disease, or unspecified chronic kidney disease: Secondary | ICD-10-CM | POA: Diagnosis not present

## 2024-05-15 DIAGNOSIS — M80051D Age-related osteoporosis with current pathological fracture, right femur, subsequent encounter for fracture with routine healing: Secondary | ICD-10-CM | POA: Diagnosis not present

## 2024-05-15 DIAGNOSIS — M80052D Age-related osteoporosis with current pathological fracture, left femur, subsequent encounter for fracture with routine healing: Secondary | ICD-10-CM | POA: Diagnosis not present

## 2024-05-15 DIAGNOSIS — I5022 Chronic systolic (congestive) heart failure: Secondary | ICD-10-CM | POA: Diagnosis not present

## 2024-05-15 DIAGNOSIS — D631 Anemia in chronic kidney disease: Secondary | ICD-10-CM | POA: Diagnosis not present

## 2024-05-15 DIAGNOSIS — L89322 Pressure ulcer of left buttock, stage 2: Secondary | ICD-10-CM | POA: Diagnosis not present

## 2024-05-17 DIAGNOSIS — I6990 Unspecified sequelae of unspecified cerebrovascular disease: Secondary | ICD-10-CM | POA: Diagnosis not present

## 2024-05-17 DIAGNOSIS — F419 Anxiety disorder, unspecified: Secondary | ICD-10-CM | POA: Diagnosis not present

## 2024-05-17 DIAGNOSIS — E785 Hyperlipidemia, unspecified: Secondary | ICD-10-CM | POA: Diagnosis not present

## 2024-05-17 DIAGNOSIS — Z9861 Coronary angioplasty status: Secondary | ICD-10-CM | POA: Diagnosis not present

## 2024-05-17 DIAGNOSIS — D649 Anemia, unspecified: Secondary | ICD-10-CM | POA: Diagnosis not present

## 2024-05-17 DIAGNOSIS — I2510 Atherosclerotic heart disease of native coronary artery without angina pectoris: Secondary | ICD-10-CM | POA: Diagnosis not present

## 2024-05-17 DIAGNOSIS — I10 Essential (primary) hypertension: Secondary | ICD-10-CM | POA: Diagnosis not present

## 2024-05-17 DIAGNOSIS — F331 Major depressive disorder, recurrent, moderate: Secondary | ICD-10-CM | POA: Diagnosis not present

## 2024-05-17 DIAGNOSIS — R7301 Impaired fasting glucose: Secondary | ICD-10-CM | POA: Diagnosis not present

## 2024-05-20 DIAGNOSIS — I739 Peripheral vascular disease, unspecified: Secondary | ICD-10-CM | POA: Diagnosis not present

## 2024-05-20 DIAGNOSIS — L89322 Pressure ulcer of left buttock, stage 2: Secondary | ICD-10-CM | POA: Diagnosis not present

## 2024-05-20 DIAGNOSIS — M80052D Age-related osteoporosis with current pathological fracture, left femur, subsequent encounter for fracture with routine healing: Secondary | ICD-10-CM | POA: Diagnosis not present

## 2024-05-20 DIAGNOSIS — I5022 Chronic systolic (congestive) heart failure: Secondary | ICD-10-CM | POA: Diagnosis not present

## 2024-05-20 DIAGNOSIS — D631 Anemia in chronic kidney disease: Secondary | ICD-10-CM | POA: Diagnosis not present

## 2024-05-20 DIAGNOSIS — N1831 Chronic kidney disease, stage 3a: Secondary | ICD-10-CM | POA: Diagnosis not present

## 2024-05-20 DIAGNOSIS — I130 Hypertensive heart and chronic kidney disease with heart failure and stage 1 through stage 4 chronic kidney disease, or unspecified chronic kidney disease: Secondary | ICD-10-CM | POA: Diagnosis not present

## 2024-05-20 DIAGNOSIS — M80051D Age-related osteoporosis with current pathological fracture, right femur, subsequent encounter for fracture with routine healing: Secondary | ICD-10-CM | POA: Diagnosis not present

## 2024-05-20 DIAGNOSIS — G20A1 Parkinson's disease without dyskinesia, without mention of fluctuations: Secondary | ICD-10-CM | POA: Diagnosis not present

## 2024-05-21 DIAGNOSIS — S7221XD Displaced subtrochanteric fracture of right femur, subsequent encounter for closed fracture with routine healing: Secondary | ICD-10-CM | POA: Diagnosis not present

## 2024-05-29 DIAGNOSIS — L89322 Pressure ulcer of left buttock, stage 2: Secondary | ICD-10-CM | POA: Diagnosis not present

## 2024-05-29 DIAGNOSIS — I5022 Chronic systolic (congestive) heart failure: Secondary | ICD-10-CM | POA: Diagnosis not present

## 2024-05-29 DIAGNOSIS — M80052D Age-related osteoporosis with current pathological fracture, left femur, subsequent encounter for fracture with routine healing: Secondary | ICD-10-CM | POA: Diagnosis not present

## 2024-05-29 DIAGNOSIS — M80051D Age-related osteoporosis with current pathological fracture, right femur, subsequent encounter for fracture with routine healing: Secondary | ICD-10-CM | POA: Diagnosis not present

## 2024-05-29 DIAGNOSIS — D631 Anemia in chronic kidney disease: Secondary | ICD-10-CM | POA: Diagnosis not present

## 2024-05-29 DIAGNOSIS — N1831 Chronic kidney disease, stage 3a: Secondary | ICD-10-CM | POA: Diagnosis not present

## 2024-05-29 DIAGNOSIS — G20A1 Parkinson's disease without dyskinesia, without mention of fluctuations: Secondary | ICD-10-CM | POA: Diagnosis not present

## 2024-05-29 DIAGNOSIS — I130 Hypertensive heart and chronic kidney disease with heart failure and stage 1 through stage 4 chronic kidney disease, or unspecified chronic kidney disease: Secondary | ICD-10-CM | POA: Diagnosis not present

## 2024-05-29 DIAGNOSIS — I739 Peripheral vascular disease, unspecified: Secondary | ICD-10-CM | POA: Diagnosis not present

## 2024-06-03 DIAGNOSIS — M80052D Age-related osteoporosis with current pathological fracture, left femur, subsequent encounter for fracture with routine healing: Secondary | ICD-10-CM | POA: Diagnosis not present

## 2024-06-03 DIAGNOSIS — M80051D Age-related osteoporosis with current pathological fracture, right femur, subsequent encounter for fracture with routine healing: Secondary | ICD-10-CM | POA: Diagnosis not present

## 2024-06-03 DIAGNOSIS — N1831 Chronic kidney disease, stage 3a: Secondary | ICD-10-CM | POA: Diagnosis not present

## 2024-06-03 DIAGNOSIS — G20A1 Parkinson's disease without dyskinesia, without mention of fluctuations: Secondary | ICD-10-CM | POA: Diagnosis not present

## 2024-06-03 DIAGNOSIS — D631 Anemia in chronic kidney disease: Secondary | ICD-10-CM | POA: Diagnosis not present

## 2024-06-03 DIAGNOSIS — L89322 Pressure ulcer of left buttock, stage 2: Secondary | ICD-10-CM | POA: Diagnosis not present

## 2024-06-03 DIAGNOSIS — I5022 Chronic systolic (congestive) heart failure: Secondary | ICD-10-CM | POA: Diagnosis not present

## 2024-06-03 DIAGNOSIS — I130 Hypertensive heart and chronic kidney disease with heart failure and stage 1 through stage 4 chronic kidney disease, or unspecified chronic kidney disease: Secondary | ICD-10-CM | POA: Diagnosis not present

## 2024-06-03 DIAGNOSIS — I739 Peripheral vascular disease, unspecified: Secondary | ICD-10-CM | POA: Diagnosis not present

## 2024-06-25 DIAGNOSIS — S7221XD Displaced subtrochanteric fracture of right femur, subsequent encounter for closed fracture with routine healing: Secondary | ICD-10-CM | POA: Diagnosis not present

## 2024-07-04 DIAGNOSIS — S7221XD Displaced subtrochanteric fracture of right femur, subsequent encounter for closed fracture with routine healing: Secondary | ICD-10-CM | POA: Diagnosis not present

## 2024-07-04 DIAGNOSIS — M898X5 Other specified disorders of bone, thigh: Secondary | ICD-10-CM | POA: Diagnosis not present

## 2024-07-30 ENCOUNTER — Encounter (HOSPITAL_BASED_OUTPATIENT_CLINIC_OR_DEPARTMENT_OTHER): Payer: Self-pay

## 2024-07-31 ENCOUNTER — Ambulatory Visit: Admitting: Physician Assistant

## 2024-07-31 NOTE — Progress Notes (Unsigned)
 Office Visit    Patient Name: Matthew Liu Date of Encounter: 08/01/2024  PCP:  Keren Vicenta BRAVO, MD   [facility]  Cardiologist:  Wilbert Bihari, MD  Advanced Practice Provider:  No care team member to display Electrophysiologist:  None   HPI    Matthew Liu is a 83 y.o. male with a past medical history of CVA, Barrett's esophagus, CAD status post MI and DES placement and ultimately CABG, OSA presents today for hospital follow-up appointment.  He was seen in the emergency room for evaluation of left lower extremity pain and concern for ischemia.  Patient was seen in [place] where providers at urgent care were unable to obtain a palpable pulse and sent the patient to the emergency department for further evaluation.  They recommended EMS transfer but patient refused and drove himself to the emergency department.  He was having lower extremity symptoms for about a week and endorsed severe leg pain worse at night.  He denied chest pain, shortness of breath, abdominal pain, nausea, vomiting. CT aortobifem ordered.   I saw the patient in 09/2022, he overall is feeling well without any significant symptoms.  He was having lower leg pain that evaluated by vascular earlier today and this was thought to be due to neuropathy.  EKG since he had a regular heart block shows normal sinus rhythm PACs.  He wanted to discuss his metoprolol  and why he was taking this medication. We discussed the reasons and he agreed. No chest pain or SOB.  Reports no shortness of breath nor dyspnea on exertion. Reports no chest pain, pressure, or tightness. No edema, orthopnea, PND. Reports no palpitations.   More recently, he was seen by Dr. Turner/21/25.  Was at [facility] for increased SOB and lower extremity edema.  2D echo showed LVEF 4045% which was new when he was started on Lasix  40 mg daily.  He had cut out salt but admits to taking food out or eating out about 50% of the time he  denied any chest pain/pressure, PND, orthopnea, dizziness, palpitations, or syncope.  Compliant with his medications.  Today, he  presents with a hx of pulmonary fibrosis and Parkinson's disease who presents for cardiovascular evaluation and medication management.  He takes furosemide  for fluid retention but occasionally skips doses, leading to frequent nocturia every 1.5 to 2 hours. Swelling has reduced, initially linked to previous leg fractures. He denies shortness of breath, chest pain, or neuropathy.  He is out of Plavix  with one pill remaining and is currently on carvedilol  and atorvastatin . Recent labs from August show LDL cholesterol at 62, triglycerides at 68, and an A1c of 5.4. Kidney function and electrolytes are normal. He does not recall having an EKG today but had one in the summer.  Reports no shortness of breath nor dyspnea on exertion. Reports no chest pain, pressure, or tightness. No edema, orthopnea, PND. Reports no palpitations.   Discussed the use of AI scribe software for clinical note transcription with the patient, who gave verbal consent to proceed.  Past Medical History    Past Medical History:  Diagnosis Date   Acute ischemic stroke (HCC) 09/15/2015   Ankle fracture, left 2009   rod placed   Ankle fracture, left    rod placed   Anxiety    Aspirin  allergy    a. Anaphylactic rxn.   Barrett's esophagus    CAD (coronary artery disease)    a. Inf STEMI 07/2013: s/p DES  x 2 from ostial to [facility] 07/13/13. Residual Cx disease for med rx (PCI if recurrent pain).   Controlled gout    Dyslipidemia    ED (erectile dysfunction)    Edema of both legs    Gastric polyp    GERD (gastroesophageal reflux disease)    Hiatal hernia    History of nuclear stress test    Nuclear stress test 5/19: EF 53, no ischemia, no infarction; Low Risk   Hydrocele of testis    Hyperglycemia    a. A1C 5.8 in 07/2013.   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   OSA (obstructive sleep apnea)    Severe obstructive sleep apnea with an AHI of 40.4/h on auto CPAP   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   LIMA to LAD, SVG to Diag, Sequential SVG to OM2-OM3, SVG to RCA, EVH via right thigh and leg   Unstable angina [facility])    Past Surgical History:  Procedure Laterality Date   BACK SURGERY     L1 kyphoplasty by Dr Malcolm 03/2018     thoractic 02-2018   cataract surgery      bilateral    COLONOSCOPY     CORONARY ANGIOPLASTY     stents    CORONARY ARTERY BYPASS GRAFT N/A 12/12/2018   Procedure: CORONARY ARTERY BYPASS GRAFTING (CABG);  Surgeon: Dusty Sudie DEL, MD;  Location: [facility] OR;  Service: Open Heart Surgery;  Laterality: N/A;   ESOPHAGOGASTRODUODENOSCOPY  04/04/2016   Short-segment Barrett's esophagus (biopsied) Gastric polyps (status post polypectomy x1) Hiatal hernia   HYDROCELE EXCISION Left 07/17/2014   Procedure: LEFT HYDROCELECTOMY;  Surgeon: Norleen JINNY Seltzer, MD;  Location: [facility] ORS;  Service: Urology;  Laterality: Left;   INTRAMEDULLARY (IM) NAIL INTERTROCHANTERIC Left 02/19/2024   Procedure: FIXATION, FRACTURE, INTERTROCHANTERIC, WITH INTRAMEDULLARY ROD;  Surgeon: Kendal Franky SQUIBB, MD;  Location: [facility] OR;  Service: Orthopedics;  Laterality: Left;   left ankle surgery  1999   LEFT HEART CATH AND CORONARY ANGIOGRAPHY N/A 12/11/2018   Procedure: LEFT HEART CATH AND CORONARY ANGIOGRAPHY;  Surgeon: Claudene Victory ORN, MD;  Location: [facility] INVASIVE CV LAB;  Service: Cardiovascular;  Laterality: N/A;   LEFT HEART CATHETERIZATION WITH CORONARY ANGIOGRAM N/A 07/13/2013   Procedure: LEFT HEART CATHETERIZATION WITH CORONARY ANGIOGRAM;  Surgeon: Victory ORN Claudene DOUGLAS, MD;  Location[facility] C[facility] LAB;  Service: Cardiovascular;  Laterality: N/A;   ORIF HIP FRACTURE Right 03/24/2024   Procedure: OPEN REDUCTION INTERNAL FIXATION HIP;  Surgeon: Edna Toribio LABOR, MD;  Location: [facility] OR;  Service: Orthopedics;  Laterality: Right;   PERCUTANEOUS  CORONARY STENT INTERVENTION (PCI-S)  07/13/2013   Procedure: PERCUTANEOUS CORONARY STENT INTERVENTION (PCI-S);  Surgeon: Victory ORN Claudene DOUGLAS, MD;  Location: [facility] C[facility] LAB;  Service: Cardiovascular;;   TEE WITHOUT CARDIOVERSION N/A 12/12/2018   Procedure: TRANSESOPHAGEAL ECHOCARDIOGRAM (TEE);  Surgeon: Dusty Sudie DEL, MD;  Location: [facility] OR;  Service: Open Heart Surgery;  Laterality: N/A;   TONSILLECTOMY      Allergies  Allergies  Allergen Reactions   Aspirin  Anaphylaxis and Shortness Of Breath   Quinine Derivatives Hives and Other (See Comments)    looks like he has the measles    EKGs/Labs/Other Studies Reviewed:   The following studies were reviewed today:  VAS ABIs 09/12/22  Summary:  Right: Resting right ankle-brachial index indicates noncompressible right  lower extremity arteries with biphasic waveforms.  The right great toe is  non-compressible.   Left: Resting left ankle-brachial index indicates noncompressible left  lower extremity arteries with triphasic waveforms. The left toe-brachial  index is normal.   EKG:  EKG is  ordered today.  The ekg ordered today demonstrates normal sinus rhythm, rate 66 bpm with first-degree AV block and PACs  Recent Labs: 03/25/2024: Magnesium  2.2 03/27/2024: BUN 28; Creatinine, Ser 1.01; Hemoglobin 8.4; Platelets 209; Potassium 4.2; Sodium 134  Recent Lipid Panel    Component Value Date/Time   CHOL 146 02/03/2023 1538   TRIG 70 02/03/2023 1538   HDL 77 02/03/2023 1538   CHOLHDL 1.9 02/03/2023 1538   CHOLHDL 2.7 09/15/2015 0205   VLDL 22 09/15/2015 0205   LDLCALC 55 02/03/2023 1538    Home Medications   Current Meds  Medication Sig   acetaminophen  (TYLENOL ) 500 MG tablet Take 2 tablets (1,000 mg total) by mouth every 8 (eight) hours as needed for mild pain (pain score 1-3).   atorvastatin  (LIPITOR ) 80 MG tablet Take 1 tablet (80 mg total) by mouth every evening.   carvedilol  (COREG ) 3.125 MG tablet Take 1 tablet (3.125 mg total) by  mouth 2 (two) times daily with a meal.   Cholecalciferol  (VITAMIN D3) 50 MCG (2000 UT) capsule Take 2,000 Units by mouth daily.   clopidogrel  (PLAVIX ) 75 MG tablet Take 1 tablet (75 mg total) by mouth daily.   cyanocobalamin  (VITAMIN B12) 100 MCG tablet Take 100 mcg by mouth daily.   docusate sodium  (COLACE) 100 MG capsule Take 100 mg by mouth daily.   feeding supplement (ENSURE PLUS HIGH PROTEIN) LIQD Take 237 mLs by mouth 2 (two) times daily between meals.   furosemide  (LASIX ) 40 MG tablet Take 40 mg by mouth daily.   meloxicam (MOBIC) 15 MG tablet Take 15 mg by mouth daily as needed for pain.   Multiple Vitamins-Minerals (ONE-A-DAY MENS 50+) TABS Take 1 tablet by mouth daily with breakfast.   ondansetron  (ZOFRAN ) 4 MG tablet Take 4 mg by mouth every 6 (six) hours as needed.   pantoprazole  (PROTONIX ) 40 MG tablet Take 1 tablet (40 mg total) by mouth daily.   polyethylene glycol (MIRALAX  / GLYCOLAX ) 17 g packet Take 17 g by mouth daily. (Patient taking differently: Take 17 g by mouth daily as needed for mild constipation or moderate constipation.)     Review of Systems      All other systems reviewed and are otherwise negative except as noted above.  Physical Exam    VS:  BP 120/68   Pulse 80   Ht 5' 7 (1.702 m)   Wt 141 lb (64 kg)   SpO2 97%   BMI 22.08 kg/m  , BMI Body mass index is 22.08 kg/m.  Wt Readings from Last 3 Encounters:  08/01/24 141 lb (64 kg)  03/24/24 134 lb 14.7 oz (61.2 kg)  02/19/24 134 lb 14.7 oz (61.2 kg)     GEN: Well nourished, well developed, in no acute distress. HEENT: normal. Neck: Supple, no JVD, carotid bruits, or masses. Cardiac: irregularly irregular, no murmurs, rubs, or gallops. No clubbing, cyanosis, edema.  Radials/PT 2+ and equal bilaterally.  Respiratory:  Respirations regular and unlabored, clear to auscultation bilaterally. GI: Soft, nontender, nondistended. MS: No deformity or atrophy. Skin: Warm and dry, no rash. Neuro:  Strength  and sensation are intact. Psych: Normal affect.  Assessment & Plan    Atherosclerotic heart disease of native coronary artery s/p prior stenting and CABG in 2020 No current chest pain. Abnormal EKGs  with PACs and tachycardia warrant further evaluation. - Order EKG to assess current cardiac status. - Schedule follow-up with Dr. Shlomo.  Bilateral lower extremity edema Swelling improved, likely related to heart failure or previous leg surgery. - Continue furosemide , advise morning dosing to reduce nocturia. - Advise reducing fluid intake after dinner to minimize nighttime urination.  Essential hypertension - Continue carvedilol  as prescribed.  Hyperlipidemia Lipid levels well-controlled. Recent labs: LDL 62, triglycerides 68. - Continue atorvastatin  as prescribed.  Unintentional weight loss Significant weight loss due to medication changes and decreased appetite. Current weight 141 lbs, down from 170 lbs in spring 2023. Appetite improved after stopping certain medications. - Monitor weight and nutritional intake to prevent further weight loss.  PACs -EKG performed today which showed NSR, 1st degree AV block and PACs. No Afib -continue current medications -sometimes he feels the skipped beats but most of the time he is asymptomatic     Disposition: Follow up 6 months with Wilbert Shlomo, MD or APP.  Signed, Orren LOISE Fabry, PA-C 08/01/2024, 2:19 PM [facility]

## 2024-08-01 ENCOUNTER — Ambulatory Visit: Attending: Internal Medicine | Admitting: Physician Assistant

## 2024-08-01 VITALS — BP 120/68 | HR 80 | Ht 67.0 in | Wt 141.0 lb

## 2024-08-01 DIAGNOSIS — I10 Essential (primary) hypertension: Secondary | ICD-10-CM | POA: Diagnosis not present

## 2024-08-01 DIAGNOSIS — I2510 Atherosclerotic heart disease of native coronary artery without angina pectoris: Secondary | ICD-10-CM | POA: Diagnosis not present

## 2024-08-01 DIAGNOSIS — E785 Hyperlipidemia, unspecified: Secondary | ICD-10-CM | POA: Diagnosis not present

## 2024-08-01 DIAGNOSIS — Z79899 Other long term (current) drug therapy: Secondary | ICD-10-CM | POA: Diagnosis not present

## 2024-08-01 DIAGNOSIS — I739 Peripheral vascular disease, unspecified: Secondary | ICD-10-CM | POA: Diagnosis not present

## 2024-08-01 DIAGNOSIS — G4733 Obstructive sleep apnea (adult) (pediatric): Secondary | ICD-10-CM | POA: Diagnosis not present

## 2024-08-01 MED ORDER — FUROSEMIDE 40 MG PO TABS: 40.0000 mg | ORAL_TABLET | Freq: Every day | ORAL | 3 refills | Status: AC

## 2024-08-01 MED ORDER — ATORVASTATIN CALCIUM 80 MG PO TABS: 80.0000 mg | ORAL_TABLET | Freq: Every evening | ORAL | 3 refills | Status: AC

## 2024-08-01 MED ORDER — CLOPIDOGREL BISULFATE 75 MG PO TABS: 75.0000 mg | ORAL_TABLET | Freq: Every day | ORAL | 3 refills | Status: AC

## 2024-08-01 MED ORDER — CARVEDILOL 3.125 MG PO TABS: 3.1250 mg | ORAL_TABLET | Freq: Two times a day (BID) | ORAL | 3 refills | Status: DC

## 2024-08-01 NOTE — Patient Instructions (Addendum)
 Medication Instructions:  Your physician recommends that you continue on your current medications as directed. Please refer to the Current Medication list given to you today.  *If you need a refill on your cardiac medications before your next appointment, please call your pharmacy*  Lab Work: NONE   Testing/Procedures: NONE  Follow-Up: At [facility], you and your health needs are our priority.  As part of our continuing mission to provide you with exceptional heart care, our providers are all part of one team.  This team includes your primary Cardiologist (physician) and Advanced Practice Providers or APPs (Physician Assistants and Nurse Practitioners) who all work together to provide you with the care you need, when you need it.  Your next appointment:   6 month(s)  Provider:   Wilbert Bihari, MD

## 2024-08-20 DIAGNOSIS — E785 Hyperlipidemia, unspecified: Secondary | ICD-10-CM | POA: Diagnosis not present

## 2024-08-20 DIAGNOSIS — Z23 Encounter for immunization: Secondary | ICD-10-CM | POA: Diagnosis not present

## 2024-08-20 DIAGNOSIS — I5022 Chronic systolic (congestive) heart failure: Secondary | ICD-10-CM | POA: Diagnosis not present

## 2024-08-20 DIAGNOSIS — F419 Anxiety disorder, unspecified: Secondary | ICD-10-CM | POA: Diagnosis not present

## 2024-08-20 DIAGNOSIS — I2510 Atherosclerotic heart disease of native coronary artery without angina pectoris: Secondary | ICD-10-CM | POA: Diagnosis not present

## 2024-08-20 DIAGNOSIS — Z9861 Coronary angioplasty status: Secondary | ICD-10-CM | POA: Diagnosis not present

## 2024-08-20 DIAGNOSIS — D649 Anemia, unspecified: Secondary | ICD-10-CM | POA: Diagnosis not present

## 2024-08-20 DIAGNOSIS — I6990 Unspecified sequelae of unspecified cerebrovascular disease: Secondary | ICD-10-CM | POA: Diagnosis not present

## 2024-08-20 DIAGNOSIS — I10 Essential (primary) hypertension: Secondary | ICD-10-CM | POA: Diagnosis not present

## 2024-08-20 DIAGNOSIS — F331 Major depressive disorder, recurrent, moderate: Secondary | ICD-10-CM | POA: Diagnosis not present

## 2024-08-20 DIAGNOSIS — R7301 Impaired fasting glucose: Secondary | ICD-10-CM | POA: Diagnosis not present

## 2024-08-22 DIAGNOSIS — K8020 Calculus of gallbladder without cholecystitis without obstruction: Secondary | ICD-10-CM | POA: Diagnosis not present

## 2024-08-22 DIAGNOSIS — I714 Abdominal aortic aneurysm, without rupture, unspecified: Secondary | ICD-10-CM | POA: Diagnosis not present

## 2024-08-22 DIAGNOSIS — N281 Cyst of kidney, acquired: Secondary | ICD-10-CM | POA: Diagnosis not present

## 2024-08-22 DIAGNOSIS — S22080A Wedge compression fracture of T11-T12 vertebra, initial encounter for closed fracture: Secondary | ICD-10-CM | POA: Diagnosis not present

## 2024-08-23 DIAGNOSIS — S22000A Wedge compression fracture of unspecified thoracic vertebra, initial encounter for closed fracture: Secondary | ICD-10-CM | POA: Diagnosis not present

## 2024-08-23 DIAGNOSIS — K8020 Calculus of gallbladder without cholecystitis without obstruction: Secondary | ICD-10-CM | POA: Diagnosis not present

## 2024-08-23 DIAGNOSIS — K5790 Diverticulosis of intestine, part unspecified, without perforation or abscess without bleeding: Secondary | ICD-10-CM | POA: Diagnosis not present

## 2024-08-23 DIAGNOSIS — N2889 Other specified disorders of kidney and ureter: Secondary | ICD-10-CM | POA: Diagnosis not present

## 2024-08-23 DIAGNOSIS — I714 Abdominal aortic aneurysm, without rupture, unspecified: Secondary | ICD-10-CM | POA: Diagnosis not present

## 2024-09-06 ENCOUNTER — Emergency Department (HOSPITAL_COMMUNITY)

## 2024-09-06 ENCOUNTER — Other Ambulatory Visit: Payer: Self-pay

## 2024-09-06 ENCOUNTER — Inpatient Hospital Stay (HOSPITAL_COMMUNITY)
Admission: EM | Admit: 2024-09-06 | Discharge: 2024-09-12 | DRG: 536 | Disposition: A | Discharge status: Skilled Nursing Facility | Attending: Family Medicine | Admitting: Family Medicine

## 2024-09-06 DIAGNOSIS — I10 Essential (primary) hypertension: Secondary | ICD-10-CM | POA: Diagnosis present

## 2024-09-06 DIAGNOSIS — Z043 Encounter for examination and observation following other accident: Secondary | ICD-10-CM | POA: Diagnosis not present

## 2024-09-06 DIAGNOSIS — S32501A Unspecified fracture of right pubis, initial encounter for closed fracture: Principal | ICD-10-CM

## 2024-09-06 DIAGNOSIS — S7291XD Unspecified fracture of right femur, subsequent encounter for closed fracture with routine healing: Secondary | ICD-10-CM | POA: Diagnosis not present

## 2024-09-06 DIAGNOSIS — M25561 Pain in right knee: Secondary | ICD-10-CM | POA: Diagnosis not present

## 2024-09-06 DIAGNOSIS — M19041 Primary osteoarthritis, right hand: Secondary | ICD-10-CM | POA: Diagnosis not present

## 2024-09-06 DIAGNOSIS — W19XXXA Unspecified fall, initial encounter: Secondary | ICD-10-CM | POA: Diagnosis not present

## 2024-09-06 DIAGNOSIS — I5022 Chronic systolic (congestive) heart failure: Secondary | ICD-10-CM

## 2024-09-06 DIAGNOSIS — I255 Ischemic cardiomyopathy: Secondary | ICD-10-CM

## 2024-09-06 DIAGNOSIS — E7800 Pure hypercholesterolemia, unspecified: Secondary | ICD-10-CM

## 2024-09-06 DIAGNOSIS — S32511A Fracture of superior rim of right pubis, initial encounter for closed fracture: Secondary | ICD-10-CM | POA: Diagnosis not present

## 2024-09-06 DIAGNOSIS — S12600D Unspecified displaced fracture of seventh cervical vertebra, subsequent encounter for fracture with routine healing: Secondary | ICD-10-CM | POA: Diagnosis not present

## 2024-09-06 DIAGNOSIS — I480 Paroxysmal atrial fibrillation: Secondary | ICD-10-CM

## 2024-09-06 DIAGNOSIS — R0989 Other specified symptoms and signs involving the circulatory and respiratory systems: Secondary | ICD-10-CM | POA: Diagnosis not present

## 2024-09-06 DIAGNOSIS — I4891 Unspecified atrial fibrillation: Secondary | ICD-10-CM

## 2024-09-06 DIAGNOSIS — S32591A Other specified fracture of right pubis, initial encounter for closed fracture: Secondary | ICD-10-CM | POA: Diagnosis not present

## 2024-09-06 DIAGNOSIS — S299XXA Unspecified injury of thorax, initial encounter: Secondary | ICD-10-CM | POA: Diagnosis not present

## 2024-09-06 DIAGNOSIS — I517 Cardiomegaly: Secondary | ICD-10-CM | POA: Diagnosis not present

## 2024-09-06 DIAGNOSIS — S4991XA Unspecified injury of right shoulder and upper arm, initial encounter: Secondary | ICD-10-CM | POA: Diagnosis not present

## 2024-09-06 DIAGNOSIS — R918 Other nonspecific abnormal finding of lung field: Secondary | ICD-10-CM | POA: Diagnosis not present

## 2024-09-06 LAB — COMPREHENSIVE METABOLIC PANEL WITH GFR
ALT: 21 U/L (ref 0–44)
AST: 27 U/L (ref 15–41)
Albumin: 3.6 g/dL (ref 3.5–5.0)
Alkaline Phosphatase: 162 U/L — ABNORMAL HIGH (ref 38–126)
Anion gap: 9 (ref 5–15)
BUN: 24 mg/dL — ABNORMAL HIGH (ref 8–23)
CO2: 25 mmol/L (ref 22–32)
Calcium: 8.4 mg/dL — ABNORMAL LOW (ref 8.9–10.3)
Chloride: 105 mmol/L (ref 98–111)
Creatinine, Ser: 1.08 mg/dL (ref 0.61–1.24)
GFR, Estimated: >60 mL/min (ref >60)
Glucose, Bld: 118 mg/dL — ABNORMAL HIGH (ref 70–99)
Potassium: 3.9 mmol/L (ref 3.5–5.1)
Sodium: 139 mmol/L (ref 135–145)
Total Bilirubin: 0.9 mg/dL (ref 0.0–1.2)
Total Protein: 6.5 g/dL (ref 6.5–8.1)

## 2024-09-06 LAB — CBC WITH DIFFERENTIAL/PLATELET
Abs Immature Granulocytes: 0.07 K/uL (ref 0.00–0.07)
Basophils Absolute: 0 K/uL (ref 0.0–0.1)
Basophils Relative: 0 %
Eosinophils Absolute: 0.3 K/uL (ref 0.0–0.5)
Eosinophils Relative: 3 %
HCT: 32.4 % — ABNORMAL LOW (ref 39.0–52.0)
Hemoglobin: 10.9 g/dL — ABNORMAL LOW (ref 13.0–17.0)
Immature Granulocytes: 1 %
Lymphocytes Relative: 14 %
Lymphs Abs: 1.2 K/uL (ref 0.7–4.0)
MCH: 34.8 pg — ABNORMAL HIGH (ref 26.0–34.0)
MCHC: 33.6 g/dL (ref 30.0–36.0)
MCV: 103.5 fL — ABNORMAL HIGH (ref 80.0–100.0)
Monocytes Absolute: 0.4 K/uL (ref 0.1–1.0)
Monocytes Relative: 5 %
Neutro Abs: 6.1 K/uL (ref 1.7–7.7)
Neutrophils Relative %: 77 %
Platelets: 154 K/uL (ref 150–400)
RBC: 3.13 MIL/uL — ABNORMAL LOW (ref 4.22–5.81)
RDW: 14 % (ref 11.5–15.5)
WBC: 8 K/uL (ref 4.0–10.5)
nRBC: 0 % (ref 0.0–0.2)

## 2024-09-06 LAB — I-STAT CHEM 8, ED
BUN: 27 mg/dL — ABNORMAL HIGH (ref 8–23)
Calcium, Ion: 1.11 mmol/L — ABNORMAL LOW (ref 1.15–1.40)
Chloride: 102 mmol/L (ref 98–111)
Creatinine, Ser: 1.3 mg/dL — ABNORMAL HIGH (ref 0.61–1.24)
Glucose, Bld: 101 mg/dL — ABNORMAL HIGH (ref 70–99)
HCT: 36 % — ABNORMAL LOW (ref 39.0–52.0)
Hemoglobin: 12.2 g/dL — ABNORMAL LOW (ref 13.0–17.0)
Potassium: 3.9 mmol/L (ref 3.5–5.1)
Sodium: 141 mmol/L (ref 135–145)
TCO2: 26 mmol/L (ref 22–32)

## 2024-09-06 LAB — I-STAT CG4 LACTIC ACID, ED: Lactic Acid, Venous: 1.2 mmol/L (ref 0.5–1.9)

## 2024-09-06 MED ORDER — HEPARIN SODIUM (PORCINE) 5000 UNIT/ML IJ SOLN
5000.0000 [IU] | Freq: Two times a day (BID) | INTRAMUSCULAR | Status: DC
  Administered 2024-09-06 – 2024-09-12 (×12): 5000 [IU] via SUBCUTANEOUS
  Filled 2024-09-06 (×13): qty 1

## 2024-09-06 MED ORDER — ATORVASTATIN CALCIUM 80 MG PO TABS
80.0000 mg | ORAL_TABLET | Freq: Every evening | ORAL | Status: DC
  Administered 2024-09-06 – 2024-09-11 (×6): 80 mg via ORAL
  Filled 2024-09-06 (×6): qty 1

## 2024-09-06 MED ORDER — SODIUM CHLORIDE 0.9% FLUSH
3.0000 mL | Freq: Two times a day (BID) | INTRAVENOUS | Status: DC
  Administered 2024-09-06 – 2024-09-12 (×11): 3 mL via INTRAVENOUS

## 2024-09-06 MED ORDER — HYDRALAZINE HCL 20 MG/ML IJ SOLN: 10.0000 mg | Freq: Four times a day (QID) | INTRAMUSCULAR | Status: DC | PRN

## 2024-09-06 MED ORDER — PANTOPRAZOLE SODIUM 40 MG IV SOLR
40.0000 mg | Freq: Two times a day (BID) | INTRAVENOUS | Status: DC
  Administered 2024-09-06 – 2024-09-07 (×2): 40 mg via INTRAVENOUS
  Filled 2024-09-06 (×2): qty 10

## 2024-09-06 MED ORDER — MORPHINE SULFATE (PF) 2 MG/ML IV SOLN
2.0000 mg | Freq: Four times a day (QID) | INTRAVENOUS | Status: AC | PRN
  Administered 2024-09-06: 2 mg via INTRAVENOUS
  Filled 2024-09-06: qty 1

## 2024-09-06 MED ORDER — SODIUM CHLORIDE 0.9 % IV SOLN: Freq: Once | INTRAVENOUS | Status: AC

## 2024-09-06 MED ORDER — ACETAMINOPHEN 650 MG RE SUPP
650.0000 mg | Freq: Four times a day (QID) | RECTAL | Status: DC | PRN
  Filled 2024-09-06 (×2): qty 1

## 2024-09-06 MED ORDER — CARVEDILOL 3.125 MG PO TABS
3.1250 mg | ORAL_TABLET | Freq: Two times a day (BID) | ORAL | Status: DC
  Administered 2024-09-07 – 2024-09-08 (×4): 3.125 mg via ORAL
  Filled 2024-09-06 (×4): qty 1

## 2024-09-06 MED ORDER — MUPIROCIN 2 % EX OINT: 1.0000 | TOPICAL_OINTMENT | Freq: Two times a day (BID) | CUTANEOUS | Status: DC

## 2024-09-06 MED ORDER — ACETAMINOPHEN 325 MG PO TABS
650.0000 mg | ORAL_TABLET | Freq: Four times a day (QID) | ORAL | Status: DC | PRN
  Administered 2024-09-06 – 2024-09-11 (×5): 650 mg via ORAL
  Filled 2024-09-06 (×5): qty 2

## 2024-09-06 MED ORDER — MORPHINE SULFATE (PF) 2 MG/ML IV SOLN
2.0000 mg | Freq: Once | INTRAVENOUS | Status: AC
  Administered 2024-09-06: 2 mg via INTRAVENOUS
  Filled 2024-09-06: qty 1

## 2024-09-06 MED ORDER — CLOPIDOGREL BISULFATE 75 MG PO TABS
75.0000 mg | ORAL_TABLET | Freq: Every day | ORAL | Status: DC
  Administered 2024-09-07 – 2024-09-12 (×6): 75 mg via ORAL
  Filled 2024-09-06 (×7): qty 1

## 2024-09-06 MED ORDER — ENSURE PLUS HIGH PROTEIN PO LIQD
237.0000 mL | Freq: Two times a day (BID) | ORAL | Status: DC
  Administered 2024-09-07 – 2024-09-12 (×11): 237 mL via ORAL

## 2024-09-06 NOTE — Progress Notes (Signed)
 Reviewed patient imaging which shows a chronic C7 compression fracture. It appears that it has healed compared to his CT in May. Would recommend no further workup or treatment of this healed chronic C7 fracture

## 2024-09-06 NOTE — ED Notes (Signed)
 Patient transported to MRI

## 2024-09-06 NOTE — ED Provider Notes (Signed)
 [facility] AT [facility] Provider Note   CSN: [id number] Arrival date & time: 09/06/24  1324     Patient presents with: Matthew Liu   Matthew Liu is a 83 y.o. male.   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  Patient states that he was in the grocery store parking lot and tripped over a curb and landed on his right side.  He states that he did hit his head but denies any loss of consciousness.  He states that he is having significant pain in his right shoulder, elbow.  He denied any lightheadedness or dizziness prior to the fall.  He states that he was able to get health up and drive himself here he was having difficulty walking due to the pain.  The history is provided by the patient and a relative.  Fall       Prior to Admission medications   Medication Sig Start Date End Date Taking? Authorizing Provider  acetaminophen  (TYLENOL ) 500 MG tablet Take 2 tablets (1,000 mg total) by mouth every 8 (eight) hours as needed for mild pain (pain score 1-3). 03/27/24   Krishnan, Gokul, MD  atorvastatin  (LIPITOR ) 80 MG tablet Take 1 tablet (80 mg total) by mouth every evening. 08/01/24   Lucien Orren SAILOR, PA-C  carvedilol  (COREG ) 3.125 MG tablet Take 1 tablet (3.125 mg total) by mouth 2 (two) times daily with a meal. 08/01/24   Lucien Orren SAILOR, PA-C  Cholecalciferol  (VITAMIN D3) 50 MCG (2000 UT) capsule Take 2,000 Units by mouth daily.    [provider]  clopidogrel  (PLAVIX ) 75 MG tablet Take 1 tablet (75 mg total) by mouth daily. 08/01/24   Lucien Orren SAILOR, PA-C  cyanocobalamin  (VITAMIN B12) 100 MCG tablet Take 100 mcg by mouth daily.    [provider]  docusate sodium  (COLACE) 100 MG capsule Take 100 mg by mouth daily.    [provider]  feeding supplement (ENSURE PLUS HIGH PROTEIN) LIQD Take 237 mLs by mouth 2 (two) times  daily between meals. 03/27/24   Krishnan, Gokul, MD  furosemide  (LASIX ) 40 MG tablet Take 1 tablet (40 mg total) by mouth daily. 08/01/24   Lucien Orren SAILOR, PA-C  meloxicam (MOBIC) 15 MG tablet Take 15 mg by mouth daily as needed for pain. 01/26/24   [provider]  Multiple Vitamins-Minerals (ONE-A-DAY MENS 50+) TABS Take 1 tablet by mouth daily with breakfast.    [provider]  ondansetron  (ZOFRAN ) 4 MG tablet Take 4 mg by mouth every 6 (six) hours as needed. 06/17/24   [provider]  pantoprazole  (PROTONIX ) 40 MG tablet Take 1 tablet (40 mg total) by mouth daily. 03/08/23   Charlanne Groom, MD  polyethylene glycol (MIRALAX  / GLYCOLAX ) 17 g packet Take 17 g by mouth daily. Patient taking differently: Take 17 g by mouth daily as needed for mild constipation or moderate constipation. 02/24/24   Will Almarie MATSU, MD    Allergies: Aspirin  and Quinine derivatives    Review of Systems  Updated Vital Signs BP (!) 139/59   Pulse (!) 37   Temp (!) 97.4 F (36.3 C) (Temporal)   Resp 20   SpO2 99%   Physical Exam Vitals and nursing note reviewed.  Constitutional:      General: He is not in acute distress.    Appearance: Normal appearance.  HENT:  Head: Normocephalic.     Comments: Small hematoma to right side of scalp    Nose: Nose normal.     Mouth/Throat:     Mouth: Mucous membranes are moist.     Pharynx: Oropharynx is clear.  Eyes:     Extraocular Movements: Extraocular movements intact.     Conjunctiva/sclera: Conjunctivae normal.  Neck:     Comments: No midline neck tenderness Cardiovascular:     Rate and Rhythm: Normal rate and regular rhythm.     Pulses: Normal pulses.     Heart sounds: Normal heart sounds.  Pulmonary:     Effort: Pulmonary effort is normal.     Breath sounds: Normal breath sounds.  Abdominal:     General: Abdomen is flat.     Palpations: Abdomen is soft.     Tenderness: There is no abdominal tenderness.  Musculoskeletal:      Cervical back: Normal range of motion and neck supple.     Comments: No midline back tenderness No bony tenderness to the left upper extremity or left lower extremity Tenderness to palpation of right shoulder with contusion to right lateral shoulder Tenderness to palpation of right elbow with small skin tear and approximately 2 cm hematoma to right elbow Tenderness to right hand with contusion over 2nd and 3rd MCP joint Tenderness to palpation of right hip with no overlying skin changes Tenderness to palpation of right knee with overlying abrasion  Skin:    General: Skin is warm and dry.     Comments: Bruising and abrasions as above  Neurological:     General: No focal deficit present.     Mental Status: He is alert and oriented to person, place, and time.  Psychiatric:        Mood and Affect: Mood normal.        Behavior: Behavior normal.     (all labs ordered are listed, but only abnormal results are displayed) Labs Reviewed  I-STAT CHEM 8, ED - Abnormal; Notable for the following components:      Result Value   BUN 27 (*)    Creatinine, Ser 1.30 (*)    Glucose, Bld 101 (*)    Calcium , Ion 1.11 (*)    Hemoglobin 12.2 (*)    HCT 36.0 (*)    All other components within normal limits  CBC WITH DIFFERENTIAL/PLATELET  COMPREHENSIVE METABOLIC PANEL WITH GFR  I-STAT CG4 LACTIC ACID, ED    EKG: None  Radiology: DG Pelvis Portable Result Date: 09/06/2024 CLINICAL DATA:  Traumarotation differential diagnosis EXAM: PORTABLE PELVIS 1-2 VIEWS COMPARISON:  03/23/2024 FINDINGS: Bilateral femoral intramedullary nails are partially visualized.Cortical irregularity involving the parasymphyseal pubic bone extending into the inferior pubic ramus. Healing fracture of the right femoral neck/proximal femur.No dislocation.Moderate joint space loss of both hips. Multilevel degenerative disc disease of the spine. Peripheral vascular atherosclerosis. IMPRESSION: Longitudinally oriented fracture  of the right parasymphyseal pubic bone extending into the inferior pubic ramus. Electronically Signed   By: Rogelia Myers M.D.   On: 09/06/2024 14:43   CT HEAD WO CONTRAST Result Date: 09/06/2024 CLINICAL DATA:  Fall around 1 hour ago. Patient recovering from hip surgery. Patient is on Plavix . EXAM: CT HEAD WITHOUT CONTRAST CT CERVICAL SPINE WITHOUT CONTRAST TECHNIQUE: Multidetector CT imaging of the head and cervical spine was performed following the standard protocol without intravenous contrast. Multiplanar CT image reconstructions of the cervical spine were also generated. RADIATION DOSE REDUCTION: This exam was performed according to the departmental dose-optimization program  which includes automated exposure control, adjustment of the mA and/or kV according to patient size and/or use of iterative reconstruction technique. COMPARISON:  02/18/2024. FINDINGS: CT HEAD FINDINGS Brain: No evidence of acute infarction, hemorrhage, hydrocephalus, extra-axial collection or mass lesion/mass effect. There is age-appropriate volume loss and mild periventricular white matter hypoattenuation consistent with chronic microvascular ischemic change. Vascular: No hyperdense vessel or unexpected calcification. Skull: Normal. Negative for fracture or focal lesion. Sinuses/Orbits: Globes and orbits are unremarkable. Sinuses are essentially clear. Other: None. CT CERVICAL SPINE FINDINGS Alignment: Normal. Skull base and vertebrae: Moderate wedge-shaped compression fracture of C7. This appears chronic, and was present on the exam from 02/18/2024, but has increased in severity. No other evidence of a fracture. No bone lesion. Soft tissues and spinal canal: No prevertebral fluid or swelling. No visible canal hematoma. Disc levels: Mild to moderate loss of disc height at C2-C3. Moderate loss of disc height at C3-C4. Marked loss of disc height at C4-C5 and C5-C6. Mild loss of disc height at C7-T1. Endplate spurring minor disc  bulging. No convincing disc herniation. Degenerative changes are stable from prior exam. Upper chest: No acute findings. Other: None. IMPRESSION: HEAD CT 1. No acute intracranial abnormalities and no change from prior study. CERVICAL CT 1. No convincing acute fracture. 2. Moderate wedge-shaped compression fracture of C7, present on the prior cervical CT, but increased in overall severity. There is no adjacent edema to suggest an acute component. Electronically Signed   By: Alm Parkins M.D.   On: 09/06/2024 14:36   CT CERVICAL SPINE WO CONTRAST Result Date: 09/06/2024 CLINICAL DATA:  Fall around 1 hour ago. Patient recovering from hip surgery. Patient is on Plavix . EXAM: CT HEAD WITHOUT CONTRAST CT CERVICAL SPINE WITHOUT CONTRAST TECHNIQUE: Multidetector CT imaging of the head and cervical spine was performed following the standard protocol without intravenous contrast. Multiplanar CT image reconstructions of the cervical spine were also generated. RADIATION DOSE REDUCTION: This exam was performed according to the departmental dose-optimization program which includes automated exposure control, adjustment of the mA and/or kV according to patient size and/or use of iterative reconstruction technique. COMPARISON:  02/18/2024. FINDINGS: CT HEAD FINDINGS Brain: No evidence of acute infarction, hemorrhage, hydrocephalus, extra-axial collection or mass lesion/mass effect. There is age-appropriate volume loss and mild periventricular white matter hypoattenuation consistent with chronic microvascular ischemic change. Vascular: No hyperdense vessel or unexpected calcification. Skull: Normal. Negative for fracture or focal lesion. Sinuses/Orbits: Globes and orbits are unremarkable. Sinuses are essentially clear. Other: None. CT CERVICAL SPINE FINDINGS Alignment: Normal. Skull base and vertebrae: Moderate wedge-shaped compression fracture of C7. This appears chronic, and was present on the exam from 02/18/2024, but has  increased in severity. No other evidence of a fracture. No bone lesion. Soft tissues and spinal canal: No prevertebral fluid or swelling. No visible canal hematoma. Disc levels: Mild to moderate loss of disc height at C2-C3. Moderate loss of disc height at C3-C4. Marked loss of disc height at C4-C5 and C5-C6. Mild loss of disc height at C7-T1. Endplate spurring minor disc bulging. No convincing disc herniation. Degenerative changes are stable from prior exam. Upper chest: No acute findings. Other: None. IMPRESSION: HEAD CT 1. No acute intracranial abnormalities and no change from prior study. CERVICAL CT 1. No convincing acute fracture. 2. Moderate wedge-shaped compression fracture of C7, present on the prior cervical CT, but increased in overall severity. There is no adjacent edema to suggest an acute component. Electronically Signed   By: Alm Parkins HERO.D.  On: 09/06/2024 14:36   DG Chest Port 1 View Result Date: 09/06/2024 CLINICAL DATA:  Trauma EXAM: PORTABLE CHEST - 1 VIEW COMPARISON:  March 23, 2024 FINDINGS: Low lung volumes with bronchovascular crowding. Patchy airspace opacities in the left lung base with blunting of the costophrenic sulcus. Mild cardiomegaly. Sternotomy wires and CABG markers. Tortuous aorta with aortic atherosclerosis. Diffuse osteopenia. Multilevel thoracic osteophytosis. IMPRESSION: Low lung volumes. Blunting of the left costophrenic sulcus with patchy retrocardiac airspace opacities, possibly representing atelectasis or aspiration with trace left pleural effusion. Electronically Signed   By: Rogelia Myers M.D.   On: 09/06/2024 14:19     Procedures   Medications Ordered in the ED - No data to display  Clinical Course as of 09/06/24 1521  Fri Sep 06, 2024  1513 Pelvis xray with right parasymphyseal pubic bone extending into the inferior pubic ramus, worsening C7 compression fracture on CT C-spine. CTH without acute disease. Remainder of x-rays pending. Patient signed out  to Dr. Simon pending remainder of work up.  [VK]    Clinical Course User Index [VK] Kingsley, Ehsan Corvin K, DO                                 Medical Decision Making This patient presents to the ED with chief complaint(s) of fall on thinners with pertinent past medical history of CAD, Parkinson's, CVA which further complicates the presenting complaint. The complaint involves an extensive differential diagnosis and also carries with it a high risk of complications and morbidity.    The differential diagnosis includes due to patient's fall on thinners concern for ICH with mass effect, cervical spine fracture, right upper extremity fracture, right lower extremity fracture, pelvis fracture, no presyncopal symptoms taking syncopal fall unlikely  Additional history obtained: Additional history obtained from family Records reviewed outpatient cardiology records  ED Course and Reassessment: On patient's arrival he is hemodynamically stable in no acute distress.  Due to his fall on thinners he was made a level 2 trauma and immediately brought back to a room.  I evaluated the patient at bedside on his arrival to the room.  Primary survey was intact.  Secondary survey was significant for hematoma to the right side of the head with bruising and tenderness to right shoulder, hematoma and skin tear to right elbow, tenderness and bruising to right hand, tenderness to right hip and an abrasion with tenderness to right knee.  The patient will have CT of head and C-spine as well as x-rays to evaluate for traumatic injury.  He declined any pain control at this time will be closely reassessed.  Independent labs interpretation:  The following labs were independently interpreted: within normal range  Independent visualization of imaging: - I independently visualized the following imaging with scope of interpretation limited to determining acute life threatening conditions related to emergency care: CTH/C-spine,  CXR, pelvis XR, which revealed pelvic fracture, worsening C7 compression fracture     Amount and/or Complexity of Data Reviewed Labs: ordered. Radiology: ordered.       Final diagnoses:  Closed fracture of right pubis, unspecified portion of pubis, initial encounter [facility])    ED Discharge Orders     None          Ellouise Richerd POUR, DO 09/06/24 1521

## 2024-09-06 NOTE — Consult Note (Signed)
 I have reviewed images.  R rami fracture  Can WBAT and mobilize as tolerated   Formal consult to follow   Matthew Liu

## 2024-09-06 NOTE — Progress Notes (Signed)
   09/06/24 1343  Spiritual Encounters  Type of Visit Initial  Care provided to: Pt and family  Conversation partners present during encounter Nurse  Reason for visit Trauma  OnCall Visit Yes   Responded to level 2 trauma. Daughter at bedside. Provided support to daughter and patient. Remained with daughter while patient went for CT scan. Advised patient and daughter of the availability of the chaplain.

## 2024-09-06 NOTE — H&P (Addendum)
 History and Physical    Patient: Matthew Liu FMW:[id number] DOB: 05/04/1941 DOA: 09/06/2024 DOS: the patient was seen and examined on 09/06/2024 . PCP: Keren Vicenta BRAVO, MD  Patient coming from: Home Chief complaint: Chief Complaint  Patient presents with   Fall   HPI:  Matthew Liu is a 83 y.o. male with past medical history  of  CAD status post CABG, IPF, OSA, history of CVA, Parkinson disease with mild cognitive impairment, and left hip fracture status postsurgical repair on 02/19/2024, being admitted today for a fall and observation. Patient drove himself to [employer] which she does typically send regulatory oriented and independent at baseline.  After getting his groceries and on his way back he was walking towards the, where he fell over the divider in the parking lot.  No other complaints today of chest pain palpitation dizziness or loss of consciousness.   ED Course:  Vital signs in the ED were notable for the following:  Vitals:   09/06/24 1630 09/06/24 1645 09/06/24 1700 09/06/24 1715  BP: (!) 145/57 (!) 161/71 (!) 163/68 (!) 161/64  Pulse: 92 93 87 92  Temp:      Resp: 19  (!) 22 (!) 22  Height:      Weight:      SpO2: 97% 97% 100% 97%  TempSrc:      BMI (Calculated):       >>ED evaluation thus far shows: CMP today shows glucose of 118 BUN of 24 initially had a creatinine of 1.30 resolved on repeat CMP check currently at 1.08 and alk phos 162 otherwise normal LFTs. CBC showing white count of 8.0 hemoglobin of 10.9 MCV of 103.5 platelets of 154. No acute intracranial abnormalities on head CT noncontrast. No convincing acute fracture, moderate wedge-shaped compression fracture of C7 presents on prior CT but increased MRIs pending. Chest x-ray today shows blunting of the left costophrenic sulcus with retrocardiac opacities suspicious for atelectasis aspiration or trace left pleural effusion. Right elbow negative for any acute fracture or dislocation. Right femur  shows negative for acute fracture or dislocation with interval healing and callus formation of the proximal right femoral fracture. Right hand x-ray was negative for any acute fracture or dislocation patient does have degenerative changes of the hand. Right knee negative for any acute findings. Pelvis x-ray shows longitudinally oriented fracture of the right parasymphyseal pubic bone extending into the inferior pubic ramus. Right shoulder x-ray negative for any acute findings.   >>While in the ED patient received the following: Medications  morphine  (PF) 2 MG/ML injection 2 mg (2 mg Intravenous Given 09/06/24 1615)   Review of Systems  Unable to perform ROS: Age  Musculoskeletal:  Positive for falls.   Past Medical History:  Diagnosis Date   Acute ischemic stroke (HCC) 09/15/2015   Ankle fracture, left 2009   rod placed   Ankle fracture, left    rod placed   Anxiety    Aspirin  allergy    a. Anaphylactic rxn.   Barrett's esophagus    CAD (coronary artery disease)    a. Inf STEMI 07/2013: s/p DES x 2 from ostial to [facility] 07/13/13. Residual Cx disease for med rx (PCI if recurrent pain).   Controlled gout    Dyslipidemia    ED (erectile dysfunction)    Edema of both legs    Gastric polyp    GERD (gastroesophageal reflux disease)    Hiatal hernia    History of nuclear stress test  Nuclear stress test 5/19: EF 53, no ischemia, no infarction; Low Risk   Hydrocele of testis    Hyperglycemia    a. A1C 5.8 in 07/2013.   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   OSA (obstructive sleep apnea)    Severe obstructive sleep apnea with an AHI of 40.4/h on auto CPAP   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   LIMA to LAD, SVG to Diag, Sequential SVG to OM2-OM3, SVG to RCA, EVH via right thigh and leg   Unstable angina [facility])    Past Surgical History:  Procedure Laterality Date   BACK SURGERY     L1 kyphoplasty by Dr  Malcolm 03/2018     thoractic 02-2018   cataract surgery      bilateral    COLONOSCOPY     CORONARY ANGIOPLASTY     stents    CORONARY ARTERY BYPASS GRAFT N/A 12/12/2018   Procedure: CORONARY ARTERY BYPASS GRAFTING (CABG);  Surgeon: Dusty Sudie DEL, MD;  Location: [facility] OR;  Service: Open Heart Surgery;  Laterality: N/A;   ESOPHAGOGASTRODUODENOSCOPY  04/04/2016   Short-segment Barrett's esophagus (biopsied) Gastric polyps (status post polypectomy x1) Hiatal hernia   HYDROCELE EXCISION Left 07/17/2014   Procedure: LEFT HYDROCELECTOMY;  Surgeon: Norleen JINNY Seltzer, MD;  Location: [facility] ORS;  Service: Urology;  Laterality: Left;   INTRAMEDULLARY (IM) NAIL INTERTROCHANTERIC Left 02/19/2024   Procedure: FIXATION, FRACTURE, INTERTROCHANTERIC, WITH INTRAMEDULLARY ROD;  Surgeon: Kendal Franky SQUIBB, MD;  Location: [facility] OR;  Service: Orthopedics;  Laterality: Left;   left ankle surgery  1999   LEFT HEART CATH AND CORONARY ANGIOGRAPHY N/A 12/11/2018   Procedure: LEFT HEART CATH AND CORONARY ANGIOGRAPHY;  Surgeon: Claudene Victory ORN, MD;  Location: [facility] INVASIVE CV LAB;  Service: Cardiovascular;  Laterality: N/A;   LEFT HEART CATHETERIZATION WITH CORONARY ANGIOGRAM N/A 07/13/2013   Procedure: LEFT HEART CATHETERIZATION WITH CORONARY ANGIOGRAM;  Surgeon: Victory ORN Claudene DOUGLAS, MD;  Location: [facility] CATH LAB;  Service: Cardiovascular;  Laterality: N/A;   ORIF HIP FRACTURE Right 03/24/2024   Procedure: OPEN REDUCTION INTERNAL FIXATION HIP;  Surgeon: Edna Toribio LABOR, MD;  Location: [facility] OR;  Service: Orthopedics;  Laterality: Right;   PERCUTANEOUS CORONARY STENT INTERVENTION (PCI-S)  07/13/2013   Procedure: PERCUTANEOUS CORONARY STENT INTERVENTION (PCI-S);  Surgeon: Victory ORN Claudene DOUGLAS, MD;  Location: [facility] CATH LAB;  Service: Cardiovascular;;   TEE WITHOUT CARDIOVERSION N/A 12/12/2018   Procedure: TRANSESOPHAGEAL ECHOCARDIOGRAM (TEE);  Surgeon: Dusty Sudie DEL, MD;  Location: [facility] OR;  Service: Open Heart Surgery;  Laterality: N/A;   TONSILLECTOMY       reports that he has never smoked. He has never used smokeless tobacco. He reports current alcohol use. He reports that he does not use drugs. Allergies  Allergen Reactions   Aspirin  Anaphylaxis and Shortness Of Breath   Quinine Derivatives Hives and Other (See Comments)    looks like he has the measles   Family History  Problem Relation Age of Onset   Hypertension Mother    Ovarian cancer Mother    Heart failure Father    Stroke Neg Hx    Prior to Admission medications   Medication Sig Start Date End Date Taking? Authorizing Provider  acetaminophen  (TYLENOL ) 500 MG tablet Take 2 tablets (1,000 mg total) by mouth every 8 (eight) hours as needed for mild pain (pain score 1-3). 03/27/24   Krishnan, Gokul, MD  atorvastatin  (LIPITOR )  80 MG tablet Take 1 tablet (80 mg total) by mouth every evening. 08/01/24   Lucien Orren SAILOR, PA-C  carvedilol  (COREG ) 3.125 MG tablet Take 1 tablet (3.125 mg total) by mouth 2 (two) times daily with a meal. 08/01/24   Lucien Orren SAILOR, PA-C  Cholecalciferol  (VITAMIN D3) 50 MCG (2000 UT) capsule Take 2,000 Units by mouth daily.    [provider]  clopidogrel  (PLAVIX ) 75 MG tablet Take 1 tablet (75 mg total) by mouth daily. 08/01/24   Lucien Orren SAILOR, PA-C  cyanocobalamin  (VITAMIN B12) 100 MCG tablet Take 100 mcg by mouth daily.    [provider]  docusate sodium  (COLACE) 100 MG capsule Take 100 mg by mouth daily.    [provider]  feeding supplement (ENSURE PLUS HIGH PROTEIN) LIQD Take 237 mLs by mouth 2 (two) times daily between meals. 03/27/24   Krishnan, Gokul, MD  furosemide  (LASIX ) 40 MG tablet Take 1 tablet (40 mg total) by mouth daily. 08/01/24   Lucien Orren SAILOR, PA-C  meloxicam (MOBIC) 15 MG tablet Take 15 mg by mouth daily as needed for pain. 01/26/24   [provider]  Multiple Vitamins-Minerals (ONE-A-DAY MENS 50+) TABS Take 1 tablet by mouth daily with breakfast.    [provider]  ondansetron  (ZOFRAN ) 4  MG tablet Take 4 mg by mouth every 6 (six) hours as needed. 06/17/24   [provider]  pantoprazole  (PROTONIX ) 40 MG tablet Take 1 tablet (40 mg total) by mouth daily. 03/08/23   Charlanne Groom, MD  polyethylene glycol (MIRALAX  / GLYCOLAX ) 17 g packet Take 17 g by mouth daily. Patient taking differently: Take 17 g by mouth daily as needed for mild constipation or moderate constipation. 02/24/24   Will Almarie MATSU, MD                                                                                 Vitals:   09/06/24 1630 09/06/24 1645 09/06/24 1700 09/06/24 1715  BP: (!) 145/57 (!) 161/71 (!) 163/68 (!) 161/64  Pulse: 92 93 87 92  Resp: 19  (!) 22 (!) 22  Temp:      TempSrc:      SpO2: 97% 97% 100% 97%  Weight:      Height:       Physical Exam Vitals reviewed.  Constitutional:      General: He is not in acute distress.    Appearance: He is not ill-appearing.     Interventions: Cervical collar in place.  HENT:     Head: Normocephalic.  Eyes:     Extraocular Movements: Extraocular movements intact.  Cardiovascular:     Rate and Rhythm: Normal rate and regular rhythm.     Pulses:          Dorsalis pedis pulses are 2+ on the right side and 2+ on the left side.       Posterior tibial pulses are 2+ on the right side and 2+ on the left side.     Heart sounds: Normal heart sounds.  Pulmonary:     Breath sounds: Normal breath sounds.  Abdominal:     General: There is no distension.  Palpations: Abdomen is soft.     Tenderness: There is no abdominal tenderness.  Skin:    Findings: Bruising present.  Neurological:     General: No focal deficit present.     Mental Status: He is alert and oriented to person, place, and time.     Cranial Nerves: Cranial nerves 2-12 are intact.     Sensory: Sensation is intact.     Motor: No weakness.     Deep Tendon Reflexes:     Reflex Scores:      Patellar reflexes are 1+ on the right side and 1+ on the left side. Psychiatric:         Attention and Perception: Attention normal.        Mood and Affect: Mood normal.        Speech: Speech normal.        Behavior: Behavior normal. Behavior is cooperative.        Thought Content: Thought content normal.     Labs on Admission: I have personally reviewed following labs and imaging studies CBC: Recent Labs  Lab 09/06/24 1351 09/06/24 1530  WBC  --  8.0  NEUTROABS  --  6.1  HGB 12.2* 10.9*  HCT 36.0* 32.4*  MCV  --  103.5*  PLT  --  154   Basic Metabolic Panel: Recent Labs  Lab 09/06/24 1351 09/06/24 1530  NA 141 139  K 3.9 3.9  CL 102 105  CO2  --  25  GLUCOSE 101* 118*  BUN 27* 24*  CREATININE 1.30* 1.08  CALCIUM   --  8.4*   GFR: Estimated Creatinine Clearance: 45.7 mL/min (by C-G formula based on SCr of 1.08 mg/dL). Liver Function Tests: Recent Labs  Lab 09/06/24 1530  AST 27  ALT 21  ALKPHOS 162*  BILITOT 0.9  PROT 6.5  ALBUMIN  3.6   No results for input(s): LIPASE, AMYLASE in the last 168 hours. No results for input(s): AMMONIA in the last 168 hours. Recent Labs    02/20/24 0700 02/21/24 0650 02/22/24 0728 03/23/24 1742 03/24/24 0850 03/25/24 0619 03/26/24 0609 03/27/24 0547 09/06/24 1351 09/06/24 1530  BUN 37* 34* 40* 36* 32* 36* 37* 28* 27* 24*  CREATININE 1.20 1.03 1.10 1.36* 1.10 1.30* 1.08 1.01 1.30* 1.08    Cardiac Enzymes: No results for input(s): CKTOTAL, CKMB, CKMBINDEX, TROPONINI in the last 168 hours. BNP (last 3 results) No results for input(s): PROBNP in the last 8760 hours. HbA1C: No results for input(s): HGBA1C in the last 72 hours. CBG: No results for input(s): GLUCAP in the last 168 hours. Lipid Profile: No results for input(s): CHOL, HDL, LDLCALC, TRIG, CHOLHDL, LDLDIRECT in the last 72 hours. Thyroid  Function Tests: No results for input(s): TSH, T4TOTAL, FREET4, T3FREE, THYROIDAB in the last 72 hours. Anemia Panel: No results for input(s): VITAMINB12, FOLATE,  FERRITIN, TIBC, IRON, RETICCTPCT in the last 72 hours. Urine analysis:    Component Value Date/Time   COLORURINE YELLOW 03/25/2022 0039   APPEARANCEUR CLEAR 03/25/2022 0039   LABSPEC 1.012 03/25/2022 0039   PHURINE 5.0 03/25/2022 0039   GLUCOSEU NEGATIVE 03/25/2022 0039   HGBUR SMALL (A) 03/25/2022 0039   BILIRUBINUR NEGATIVE 03/25/2022 0039   KETONESUR NEGATIVE 03/25/2022 0039   PROTEINUR NEGATIVE 03/25/2022 0039   NITRITE NEGATIVE 03/25/2022 0039   LEUKOCYTESUR NEGATIVE 03/25/2022 0039   Radiological Exams on Admission: [name] Result Date: 09/06/2024 CLINICAL DATA:  Follow-up examination for C7 fracture. EXAM: MRI CERVICAL SPINE WITHOUT CONTRAST TECHNIQUE:  Multiplanar, multisequence MR imaging of the cervical spine was performed. No intravenous contrast was administered. COMPARISON:  Prior CT from earlier the same day. FINDINGS: Alignment: Exaggeration of the normal cervical lordosis. No significant listhesis. Vertebrae: Compression fracture involving the C7 vertebral body with up to 75% height loss without bony retropulsion. This is largely chronic in appearance without significant residual marrow edema. Otherwise, vertebral body height maintained with no other acute or chronic fracture. Bone marrow signal intensity within normal limits. No worrisome osseous lesions. Reactive marrow edema present about the left C4-5 facet due to facet arthritis. Cord: Normal signal morphology. Posterior Fossa, vertebral arteries, paraspinal tissues: Patchy signal abnormality within the pons, consistent with chronic small vessel ischemic disease. Probable small benign arachnoid cyst noted at the supra cerebellar cistern. Craniocervical junction within normal limits. Paraspinous soft tissues within normal limits. Normal flow voids seen within the vertebral arteries bilaterally. Disc levels: C2-C3: Degenerative intervertebral disc space narrowing with diffuse disc bulge and endplate  spurring. Moderate left facet arthrosis. No significant spinal stenosis. Foramina remain patent. C3-C4: Degenerative intervertebral disc space narrowing with diffuse disc osteophyte complex. Left greater than right facet degeneration with bony ankylosis on the left. No more than mild spinal stenosis. Moderate left C4 foraminal narrowing. Right neural foramen remains patent. C4-C5: Degenerative disc space narrowing with diffuse disc osteophyte complex, asymmetric to the right. Posterior component flattens and partially faces the ventral thecal sac. Moderate left facet arthrosis with reactive marrow edema. Resultant mild-to-moderate spinal stenosis. Severe right C5 foraminal narrowing. Left neural foramina remains patent. C5-C6: Advanced degenerative vertebral disc space narrowing with diffuse disc osteophyte complex, asymmetric to the right. Flattening and partial effacement of the ventral thecal sac. Resultant mild-to-moderate spinal stenosis. Severe right with moderate left C6 foraminal narrowing. C6-C7: Minimal disc bulge. Mild facet degeneration on the right. No spinal stenosis. Foramina remain patent. C7-T1: Negative interspace. Mild right-sided facet hypertrophy. No stenosis. IMPRESSION: 1. Chronic compression fracture involving the C7 vertebral body with up to 75% height loss without bony retropulsion. No acute fracture within the cervical spine. 2. Multilevel cervical spondylosis with resultant mild to moderate spinal stenosis at C4-5 and C5-6. 3. Multifactorial degenerative changes with resultant multilevel foraminal narrowing as above. Notable findings include moderate left C4 foraminal stenosis, severe right C5 foraminal narrowing, with severe right and moderate left C6 foraminal stenosis. 4. Reactive marrow edema about the left C4-5 facet due to facet arthritis. Finding could serve as a source for neck pain. Electronically Signed   By: Morene Hoard M.D.   On: 09/06/2024 19:03   DG Shoulder  Right Result Date: 09/06/2024 CLINICAL DATA:  Trauma EXAM: RIGHT SHOULDER - 2+ VIEW COMPARISON:  None Available. FINDINGS: There is no evidence of fracture or dislocation. There is no evidence of arthropathy or other focal bone abnormality. Soft tissues are unremarkable. IMPRESSION: Negative. Electronically Signed   By: Greig Pique M.D.   On: 09/06/2024 16:02   DG FEMUR PORT, 1V RIGHT Result Date: 09/06/2024 CLINICAL DATA:  Trauma EXAM: RIGHT FEMUR PORTABLE 1 VIEW COMPARISON:  Right femur x-ray 03/24/2024. FINDINGS: Right femoral intramedullary nail with hip screws again seen. There has been interval healing and callus formation of the proximal right femoral fracture. No definite acute fracture or dislocation. No hardware loosening. Peripheral vascular calcifications are present. IMPRESSION: 1. No acute fracture or dislocation. 2. Interval healing and callus formation of the proximal right femoral fracture. Electronically Signed   By: Greig Pique M.D.   On: 09/06/2024 15:56   DG  Knee Right Port Result Date: 09/06/2024 CLINICAL DATA:  Clemens today at the grocery store walking to his car. Right knee pain. EXAM: PORTABLE RIGHT KNEE - 1-2 VIEW COMPARISON:  03/24/2024. FINDINGS: No fracture or bone lesion. Knee joint normally spaced and aligned. Distal aspect of an intramedullary rod is well-seated and positioned and unchanged. No joint effusion. Significant arterial vascular calcifications noted posteriorly. IMPRESSION: 1. No fracture or acute finding.  No joint abnormality. Electronically Signed   By: Alm Parkins M.D.   On: 09/06/2024 15:52   DG Elbow Complete Right Result Date: 09/06/2024 CLINICAL DATA:  Fall onto right side EXAM: RIGHT ELBOW - COMPLETE 4 VIEW COMPARISON:  None Available. FINDINGS: There is no evidence of fracture, dislocation, or joint effusion. There is no evidence of arthropathy or other focal bone abnormality. Soft tissues are unremarkable. IMPRESSION: No acute fracture or  dislocation. Electronically Signed   By: Limin  Xu M.D.   On: 09/06/2024 15:52   DG Hand Complete Right Result Date: 09/06/2024 CLINICAL DATA:  Status post fall onto right side EXAM: RIGHT HAND - COMPLETE 3 VIEW COMPARISON:  None Available. FINDINGS: There is no evidence of fracture or dislocation. Mild interphalangeal joint space narrowing. Positive ulnar variance. Soft tissues are unremarkable. IMPRESSION: 1. No acute fracture or dislocation. 2. Mild degenerative changes of the hand. Electronically Signed   By: Limin  Xu M.D.   On: 09/06/2024 15:51   DG Pelvis Portable Result Date: 09/06/2024 CLINICAL DATA:  Traumarotation differential diagnosis EXAM: PORTABLE PELVIS 1-2 VIEWS COMPARISON:  03/23/2024 FINDINGS: Bilateral femoral intramedullary nails are partially visualized.Cortical irregularity involving the parasymphyseal pubic bone extending into the inferior pubic ramus. Healing fracture of the right femoral neck/proximal femur.No dislocation.Moderate joint space loss of both hips. Multilevel degenerative disc disease of the spine. Peripheral vascular atherosclerosis. IMPRESSION: Longitudinally oriented fracture of the right parasymphyseal pubic bone extending into the inferior pubic ramus. Electronically Signed   By: Rogelia Myers M.D.   On: 09/06/2024 14:43   CT HEAD WO CONTRAST Result Date: 09/06/2024 CLINICAL DATA:  Fall around 1 hour ago. Patient recovering from hip surgery. Patient is on Plavix . EXAM: CT HEAD WITHOUT CONTRAST CT CERVICAL SPINE WITHOUT CONTRAST TECHNIQUE: Multidetector CT imaging of the head and cervical spine was performed following the standard protocol without intravenous contrast. Multiplanar CT image reconstructions of the cervical spine were also generated. RADIATION DOSE REDUCTION: This exam was performed according to the departmental dose-optimization program which includes automated exposure control, adjustment of the mA and/or kV according to patient size and/or use  of iterative reconstruction technique. COMPARISON:  02/18/2024. FINDINGS: CT HEAD FINDINGS Brain: No evidence of acute infarction, hemorrhage, hydrocephalus, extra-axial collection or mass lesion/mass effect. There is age-appropriate volume loss and mild periventricular white matter hypoattenuation consistent with chronic microvascular ischemic change. Vascular: No hyperdense vessel or unexpected calcification. Skull: Normal. Negative for fracture or focal lesion. Sinuses/Orbits: Globes and orbits are unremarkable. Sinuses are essentially clear. Other: None. CT CERVICAL SPINE FINDINGS Alignment: Normal. Skull base and vertebrae: Moderate wedge-shaped compression fracture of C7. This appears chronic, and was present on the exam from 02/18/2024, but has increased in severity. No other evidence of a fracture. No bone lesion. Soft tissues and spinal canal: No prevertebral fluid or swelling. No visible canal hematoma. Disc levels: Mild to moderate loss of disc height at C2-C3. Moderate loss of disc height at C3-C4. Marked loss of disc height at C4-C5 and C5-C6. Mild loss of disc height at C7-T1. Endplate spurring minor disc bulging. No  convincing disc herniation. Degenerative changes are stable from prior exam. Upper chest: No acute findings. Other: None. IMPRESSION: HEAD CT 1. No acute intracranial abnormalities and no change from prior study. CERVICAL CT 1. No convincing acute fracture. 2. Moderate wedge-shaped compression fracture of C7, present on the prior cervical CT, but increased in overall severity. There is no adjacent edema to suggest an acute component. Electronically Signed   By: Alm Parkins M.D.   On: 09/06/2024 14:36   CT CERVICAL SPINE WO CONTRAST Result Date: 09/06/2024 CLINICAL DATA:  Fall around 1 hour ago. Patient recovering from hip surgery. Patient is on Plavix . EXAM: CT HEAD WITHOUT CONTRAST CT CERVICAL SPINE WITHOUT CONTRAST TECHNIQUE: Multidetector CT imaging of the head and cervical spine  was performed following the standard protocol without intravenous contrast. Multiplanar CT image reconstructions of the cervical spine were also generated. RADIATION DOSE REDUCTION: This exam was performed according to the departmental dose-optimization program which includes automated exposure control, adjustment of the mA and/or kV according to patient size and/or use of iterative reconstruction technique. COMPARISON:  02/18/2024. FINDINGS: CT HEAD FINDINGS Brain: No evidence of acute infarction, hemorrhage, hydrocephalus, extra-axial collection or mass lesion/mass effect. There is age-appropriate volume loss and mild periventricular white matter hypoattenuation consistent with chronic microvascular ischemic change. Vascular: No hyperdense vessel or unexpected calcification. Skull: Normal. Negative for fracture or focal lesion. Sinuses/Orbits: Globes and orbits are unremarkable. Sinuses are essentially clear. Other: None. CT CERVICAL SPINE FINDINGS Alignment: Normal. Skull base and vertebrae: Moderate wedge-shaped compression fracture of C7. This appears chronic, and was present on the exam from 02/18/2024, but has increased in severity. No other evidence of a fracture. No bone lesion. Soft tissues and spinal canal: No prevertebral fluid or swelling. No visible canal hematoma. Disc levels: Mild to moderate loss of disc height at C2-C3. Moderate loss of disc height at C3-C4. Marked loss of disc height at C4-C5 and C5-C6. Mild loss of disc height at C7-T1. Endplate spurring minor disc bulging. No convincing disc herniation. Degenerative changes are stable from prior exam. Upper chest: No acute findings. Other: None. IMPRESSION: HEAD CT 1. No acute intracranial abnormalities and no change from prior study. CERVICAL CT 1. No convincing acute fracture. 2. Moderate wedge-shaped compression fracture of C7, present on the prior cervical CT, but increased in overall severity. There is no adjacent edema to suggest an acute  component. Electronically Signed   By: Alm Parkins M.D.   On: 09/06/2024 14:36   DG Chest Port 1 View Result Date: 09/06/2024 CLINICAL DATA:  Trauma EXAM: PORTABLE CHEST - 1 VIEW COMPARISON:  March 23, 2024 FINDINGS: Low lung volumes with bronchovascular crowding. Patchy airspace opacities in the left lung base with blunting of the costophrenic sulcus. Mild cardiomegaly. Sternotomy wires and CABG markers. Tortuous aorta with aortic atherosclerosis. Diffuse osteopenia. Multilevel thoracic osteophytosis. IMPRESSION: Low lung volumes. Blunting of the left costophrenic sulcus with patchy retrocardiac airspace opacities, possibly representing atelectasis or aspiration with trace left pleural effusion. Electronically Signed   By: Rogelia Myers M.D.   On: 09/06/2024 14:19   Data Reviewed: Relevant notes from primary care and specialist visits, past discharge summaries as available in EHR, including Care Everywhere . Prior diagnostic testing as pertinent to current admission diagnoses, Updated medications and problem lists for reconciliation .ED course, including vitals, labs, imaging, treatment and response to treatment,Triage notes, nursing and pharmacy notes and ED provider's notes.Notable results as noted in HPI.Discussed case with EDMD/ ED APP/ or Specialty MD on call and  as needed.  Assessment & Plan  >>Falls: Pt has h/o parkinsonism and frequent falls. Fall precaution. No meds since past few months he stopped taking his antiparkinsonian meds.    >>C7 wedge compression fracture: Currently patient is in a C-spine collar. Neuro surgery consulted  and suspect this is old fracture. MRI is pending.     >>Right pubic rami fracture: Non-operative and WBAT. Pain control and orthopedic consulted.    >>Anemia:    Latest Ref Rng & Units 09/06/2024    3:30 PM 09/06/2024    1:51 PM 03/27/2024    5:47 AM  CBC  WBC 4.0 - 10.5 K/uL 8.0   7.1   Hemoglobin 13.0 - 17.0 g/dL 89.0  87.7  8.4   Hematocrit  39.0 - 52.0 % 32.4  36.0  25.9   Platelets 150 - 400 K/uL 154   209   Stable we will follow.  Discontinue Mobic.  IV PPI.    >> PAD/CAD S/P CABG: Clinically stable.  No complaints of chest pain shortness of breath or any anginal equivalent symptoms. PTA meds include Plavix  75, Coreg  3.25 twice daily.   >>Parkinson's disease with mild cognitive impairment: No antiparkinsonian meds.   >> GERD / History of Barrett's esophagus: IV PPI and aspiration precautions.   >>Essential Htn: Vitals:   09/06/24 1332 09/06/24 1400 09/06/24 1530 09/06/24 1545  BP: (!) 146/78 (!) 139/59 136/87 (!) 153/89   09/06/24 1600 09/06/24 1615 09/06/24 1630 09/06/24 1645  BP: (!) 145/82 (!) 139/98 (!) 145/57 (!) 161/71   09/06/24 1700 09/06/24 1715  BP: (!) 163/68 (!) 161/64  Continue patient on Coreg  3.125,.  Hydralazine .   DVT prophylaxis:  SCDs Consults:  Neurosurgery. Orthopedics.  Advance Care Planning:    Code Status: Full Code   Family Communication:  None Disposition Plan:  To be determined. Severity of Illness: The appropriate patient status for this patient is INPATIENT. Inpatient status is judged to be reasonable and necessary in order to provide the required intensity of service to ensure the patient's safety. The patient's presenting symptoms, physical exam findings, and initial radiographic and laboratory data in the context of their chronic comorbidities is felt to place them at high risk for further clinical deterioration. Furthermore, it is not anticipated that the patient will be medically stable for discharge from the hospital within 2 midnights of admission.   * I certify that at the point of admission it is my clinical judgment that the patient will require inpatient hospital care spanning beyond 2 midnights from the point of admission due to high intensity of service, high risk for further deterioration and high frequency of surveillance required.*  Unresulted Labs (From  admission, onward)     Start     Ordered   09/07/24 0500  Comprehensive metabolic panel  Tomorrow morning,   R        09/06/24 1853   09/07/24 0500  CBC  Tomorrow morning,   R        09/06/24 1853   09/07/24 0500  Occult blood card to lab, stool RN will collect  Daily,   R     Question:  Specimen to be collected by:  Answer:  RN will collect   09/06/24 1853            Meds ordered this encounter  Medications   morphine  (PF) 2 MG/ML injection 2 mg   carvedilol  (COREG ) tablet 3.125 mg   atorvastatin  (LIPITOR ) tablet 80 mg   clopidogrel  (PLAVIX )  tablet 75 mg   feeding supplement (ENSURE PLUS HIGH PROTEIN) liquid 237 mL   pantoprazole  (PROTONIX ) injection 40 mg   sodium chloride  flush (NS) 0.9 % injection 3 mL   0.9 %  sodium chloride  infusion   OR Linked Order Group    acetaminophen  (TYLENOL ) tablet 650 mg    acetaminophen  (TYLENOL ) suppository 650 mg   morphine  (PF) 2 MG/ML injection 2 mg   heparin  injection 5,000 Units   hydrALAZINE  (APRESOLINE ) injection 10 mg     Orders Placed This Encounter  Procedures   DG Chest Port 1 View   DG Pelvis Portable   CT HEAD WO CONTRAST   CT CERVICAL SPINE WO CONTRAST   DG Hand Complete Right   DG Elbow Complete Right   DG Knee Right Port   DG FEMUR PORT, 1V RIGHT   DG Shoulder Right   [name]   CBC with Differential   Comprehensive metabolic panel   Comprehensive metabolic panel   CBC   Occult blood card to lab, stool RN will collect   DIET DYS 2 Room service appropriate? Yes with Assist; Fluid consistency: Thin   ED Cardiac monitoring   Measure blood pressure   Initiate Carrier Fluid Protocol   Maintain IV access   Vital signs   Notify physician (specify)   Mobility Protocol: No Restrictions   Refer to Sidebar Report Mobility Protocol for Adult Inpatient   Initiate Adult Central Line Maintenance and Catheter Clearance Protocol for patients with central line (CVC, PICC, Port, Hemodialysis, Trialysis)    Daily weights   Intake and Output   Initiate CHG Protocol for patients in ICU/SD or any patient with a central line or foley catheter   Do not place and if present remove PureWick   Initiate Oral Care Protocol   Initiate Carrier Fluid Protocol   RN may order General Admission PRN Orders utilizing General Admission PRN medications (through manage orders) for the following patient needs: allergy symptoms (Claritin), cold sores (Carmex), cough (Robitussin DM), eye irritation (Liquifilm Tears), hemorrhoids (Tucks), indigestion (Maalox), minor skin irritation (Hydrocortisone Cream), muscle pain Lucienne Gay), nose irritation (saline nasal spray) and sore throat (Chloraseptic spray).   SCDs   Cardiac Monitoring Continuous x 48 hours Indications for use: Other; Other indications for use: Falls   Swallow screen   Orthostatic vital signs   Full code   Consult to neurosurgery   Consult to orthopedic surgery   Consult to hospitalist   ED Pulse oximetry, continuous   Pulse oximetry check with vital signs   Oxygen therapy Mode or (Route): Nasal cannula; Liters Per Minute: 2; Keep O2 saturation between: greater than 92 %   I-stat chem 8, ed   I-Stat CG4 Lactic Acid, ED   Place in observation (patient's expected length of stay will be less than 2 midnights)   Aspiration precautions   Fall precautions    Author: Mario LULLA Blanch, MD 12 pm- 8 pm. [facility]. 09/06/2024 7:40 PM Please note for any communication after hours contact [facility] Assigned provider on call on Amion.

## 2024-09-06 NOTE — ED Triage Notes (Signed)
 Patient fell about a hour ago and hit his head, He is also recovering from hip surgery.   He is on plavix .

## 2024-09-06 NOTE — ED Provider Notes (Signed)
  Physical Exam  BP (!) 139/59   Pulse (!) 37   Temp (!) 97.4 F (36.3 C) (Temporal)   Resp 20   SpO2 99%   Physical Exam Vitals and nursing note reviewed.  Constitutional:      General: He is not in acute distress.    Appearance: He is well-developed.  HENT:     Head: Normocephalic and atraumatic.  Eyes:     Conjunctiva/sclera: Conjunctivae normal.  Cardiovascular:     Rate and Rhythm: Normal rate and regular rhythm.     Heart sounds: No murmur heard. Pulmonary:     Effort: Pulmonary effort is normal. No respiratory distress.     Breath sounds: Normal breath sounds.  Abdominal:     Palpations: Abdomen is soft.     Tenderness: There is no abdominal tenderness.  Musculoskeletal:        General: No swelling.     Cervical back: Neck supple.  Skin:    General: Skin is warm and dry.     Capillary Refill: Capillary refill takes less than 2 seconds.  Neurological:     Mental Status: He is alert.  Psychiatric:        Mood and Affect: Mood normal.     Procedures  Procedures  ED Course / MDM   Clinical Course as of 09/06/24 1707  Fri Sep 06, 2024  1513 Pelvis xray with right parasymphyseal pubic bone extending into the inferior pubic ramus, worsening C7 compression fracture on CT C-spine. CTH without acute disease. Remainder of x-rays pending. Patient signed out to Dr. Simon pending remainder of work up.  [VK]    Clinical Course User Index [VK] Kingsley, Victoria K, DO   Medical Decision Making Amount and/or Complexity of Data Reviewed Labs: ordered. Radiology: ordered.  Risk Prescription drug management.   History of coronary disease, on Plavix , hypertension, history of CVA, Parkinson's.  Presents after mechanical fall.  No concerns any kind of cardiogenic syncope other syncope.  Purely mechanical in nature.  Pending imaging.   Per Dr. Beverley weight bearing as tolerated. Can use walker.   Neurosurgery: recommended MRI. Will follow. C collar until this is  completed.      Simon Lavonia SAILOR, MD 09/06/24 2239

## 2024-09-06 NOTE — ED Triage Notes (Addendum)
 Pt. BIB POV with daughter due to a fall on thinners; Pt. Is on Plavix , and reported that he fell today at the grocery store, walking to his car, he tripped over a curb and hit his L shoulder and L elbow; Pt. Is on plavix ; Pt. VSS; Pt. Denies LOC; Hx of hip surgery and Parkinson's, Pt. Is A/O x4.

## 2024-09-06 NOTE — ED Notes (Signed)
 CCMD called.

## 2024-09-06 NOTE — Progress Notes (Signed)
 Orthopedic Tech Progress Note Patient Details:  Matthew Liu 1941-05-13 [id number] Level 2 Trauma. Not needed Patient ID: Matthew Liu, male   DOB: [birth year], 83 y.o.   MRN: [id number]  [name] [name] Cos 09/06/2024, 1:33 PM

## 2024-09-06 NOTE — ED Notes (Signed)
 C-collar applied per Dr. Simon.

## 2024-09-07 DIAGNOSIS — S32511A Fracture of superior rim of right pubis, initial encounter for closed fracture: Secondary | ICD-10-CM | POA: Diagnosis not present

## 2024-09-07 DIAGNOSIS — W19XXXA Unspecified fall, initial encounter: Secondary | ICD-10-CM | POA: Diagnosis not present

## 2024-09-07 DIAGNOSIS — S32501A Unspecified fracture of right pubis, initial encounter for closed fracture: Secondary | ICD-10-CM | POA: Diagnosis not present

## 2024-09-07 LAB — COMPREHENSIVE METABOLIC PANEL WITH GFR
ALT: 16 U/L (ref 0–44)
AST: 23 U/L (ref 15–41)
Albumin: 2.9 g/dL — ABNORMAL LOW (ref 3.5–5.0)
Alkaline Phosphatase: 120 U/L (ref 38–126)
Anion gap: 7 (ref 5–15)
BUN: 24 mg/dL — ABNORMAL HIGH (ref 8–23)
CO2: 24 mmol/L (ref 22–32)
Calcium: 7.8 mg/dL — ABNORMAL LOW (ref 8.9–10.3)
Chloride: 103 mmol/L (ref 98–111)
Creatinine, Ser: 1.11 mg/dL (ref 0.61–1.24)
GFR, Estimated: >60 mL/min (ref >60)
Glucose, Bld: 187 mg/dL — ABNORMAL HIGH (ref 70–99)
Potassium: 3.4 mmol/L — ABNORMAL LOW (ref 3.5–5.1)
Sodium: 134 mmol/L — ABNORMAL LOW (ref 135–145)
Total Bilirubin: 0.9 mg/dL (ref 0.0–1.2)
Total Protein: 5.7 g/dL — ABNORMAL LOW (ref 6.5–8.1)

## 2024-09-07 LAB — CBC
HCT: 28.2 % — ABNORMAL LOW (ref 39.0–52.0)
Hemoglobin: 9.6 g/dL — ABNORMAL LOW (ref 13.0–17.0)
MCH: 34.9 pg — ABNORMAL HIGH (ref 26.0–34.0)
MCHC: 34 g/dL (ref 30.0–36.0)
MCV: 102.5 fL — ABNORMAL HIGH (ref 80.0–100.0)
Platelets: 130 K/uL — ABNORMAL LOW (ref 150–400)
RBC: 2.75 MIL/uL — ABNORMAL LOW (ref 4.22–5.81)
RDW: 14.1 % (ref 11.5–15.5)
WBC: 5 K/uL (ref 4.0–10.5)
nRBC: 0 % (ref 0.0–0.2)

## 2024-09-07 LAB — SURGICAL PCR SCREEN
MRSA, PCR: NEGATIVE
Staphylococcus aureus: NEGATIVE

## 2024-09-07 LAB — OCCULT BLOOD X 1 CARD TO LAB, STOOL: Fecal Occult Bld: NEGATIVE

## 2024-09-07 MED ORDER — PANTOPRAZOLE SODIUM 40 MG PO TBEC
40.0000 mg | DELAYED_RELEASE_TABLET | Freq: Every day | ORAL | Status: DC
  Administered 2024-09-08 – 2024-09-12 (×5): 40 mg via ORAL
  Filled 2024-09-07 (×5): qty 1

## 2024-09-07 NOTE — Plan of Care (Signed)
  Problem: Activity: Goal: Risk for activity intolerance will decrease Outcome: Progressing   Problem: Nutrition: Goal: Adequate nutrition will be maintained Outcome: Progressing   Problem: Coping: Goal: Level of anxiety will decrease Outcome: Progressing   Problem: Elimination: Goal: Will not experience complications related to urinary retention Outcome: Progressing   Problem: Pain Managment: Goal: General experience of comfort will improve and/or be controlled Outcome: Progressing

## 2024-09-07 NOTE — Consult Note (Signed)
 ORTHOPAEDIC CONSULTATION  REQUESTING PHYSICIAN: Roann Gouty, MD  Chief Complaint: fall  HPI: Matthew Liu is a 83 y.o. male with  past medical history  of  CAD status post CABG, IPF, OSA, history of CVA, Parkinson disease with mild cognitive impairment, and left hip fracture status postsurgical repair on 02/19/2024, being admitted today for a fall and observation.   Patient had a fall in the parking lot at [employer]. He was able to drive himself to the ER. He complains of right sided pain Work-up in the ED was significant for right pubic rami fracture. Orthopedics consulted for evaluation.  Patient sitting up in hospital bed. He is still having right sided pain, but his leg is worse than his arm. He uses a walker occasionally at home. He is the caregiver for his wife who has dementia. They live in single level home. He has a ramp to get inside.   Past Medical History:  Diagnosis Date   Acute ischemic stroke (HCC) 09/15/2015   Ankle fracture, left 2009   rod placed   Ankle fracture, left    rod placed   Anxiety    Aspirin  allergy    a. Anaphylactic rxn.   Barrett's esophagus    CAD (coronary artery disease)    a. Inf STEMI 07/2013: s/p DES x 2 from ostial to [facility] 07/13/13. Residual Cx disease for med rx (PCI if recurrent pain).   Controlled gout    Dyslipidemia    ED (erectile dysfunction)    Edema of both legs    Gastric polyp    GERD (gastroesophageal reflux disease)    Hiatal hernia    History of nuclear stress test    Nuclear stress test 5/19: EF 53, no ischemia, no infarction; Low Risk   Hydrocele of testis    Hyperglycemia    a. A1C 5.8 in 07/2013.   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   OSA (obstructive sleep apnea)    Severe obstructive sleep apnea with an AHI of 40.4/h on auto CPAP   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   LIMA to LAD, SVG to Diag, Sequential SVG to OM2-OM3, SVG to  RCA, EVH via right thigh and leg   Unstable angina [facility])    Past Surgical History:  Procedure Laterality Date   BACK SURGERY     L1 kyphoplasty by Dr Malcolm 03/2018     thoractic 02-2018   cataract surgery      bilateral    COLONOSCOPY     CORONARY ANGIOPLASTY     stents    CORONARY ARTERY BYPASS GRAFT N/A 12/12/2018   Procedure: CORONARY ARTERY BYPASS GRAFTING (CABG);  Surgeon: Dusty Sudie DEL, MD;  Location: [facility] OR;  Service: Open Heart Surgery;  Laterality: N/A;   ESOPHAGOGASTRODUODENOSCOPY  04/04/2016   Short-segment Barrett's esophagus (biopsied) Gastric polyps (status post polypectomy x1) Hiatal hernia   HYDROCELE EXCISION Left 07/17/2014   Procedure: LEFT HYDROCELECTOMY;  Surgeon: Norleen JINNY Seltzer, MD;  Location: [facility] ORS;  Service: Urology;  Laterality: Left;   INTRAMEDULLARY (IM) NAIL INTERTROCHANTERIC Left 02/19/2024   Procedure: FIXATION, FRACTURE, INTERTROCHANTERIC, WITH INTRAMEDULLARY ROD;  Surgeon: Kendal Franky SQUIBB, MD;  Location: [facility] OR;  Service: Orthopedics;  Laterality: Left;   left ankle surgery  1999   LEFT HEART CATH AND CORONARY ANGIOGRAPHY N/A 12/11/2018   Procedure: LEFT HEART CATH AND CORONARY ANGIOGRAPHY;  Surgeon: Claudene,  Victory ORN, MD;  Location: [facility] INVASIVE CV LAB;  Service: Cardiovascular;  Laterality: N/A;   LEFT HEART CATHETERIZATION WITH CORONARY ANGIOGRAM N/A 07/13/2013   Procedure: LEFT HEART CATHETERIZATION WITH CORONARY ANGIOGRAM;  Surgeon: Victory ORN Claudene DOUGLAS, MD;  Location: [facility] CATH LAB;  Service: Cardiovascular;  Laterality: N/A;   ORIF HIP FRACTURE Right 03/24/2024   Procedure: OPEN REDUCTION INTERNAL FIXATION HIP;  Surgeon: Edna Toribio LABOR, MD;  Location: [facility] OR;  Service: Orthopedics;  Laterality: Right;   PERCUTANEOUS CORONARY STENT INTERVENTION (PCI-S)  07/13/2013   Procedure: PERCUTANEOUS CORONARY STENT INTERVENTION (PCI-S);  Surgeon: Victory ORN Claudene DOUGLAS, MD;  Location: [facility] CATH LAB;  Service: Cardiovascular;;   TEE WITHOUT CARDIOVERSION N/A 12/12/2018   Procedure:  TRANSESOPHAGEAL ECHOCARDIOGRAM (TEE);  Surgeon: Dusty Sudie DEL, MD;  Location: [facility] OR;  Service: Open Heart Surgery;  Laterality: N/A;   TONSILLECTOMY     Social History   Socioeconomic History   Marital status: Married    Spouse name: Pat   Number of children: 2   Years of education: 12   Highest education level: Not on file  Occupational History   Occupation: Chiropractor  Tobacco Use   Smoking status: Never   Smokeless tobacco: Never  Vaping Use   Vaping status: Never Used  Substance and Sexual Activity   Alcohol use: Yes    Comment: occasional beer    Drug use: No   Sexual activity: Not Currently  Other Topics Concern   Not on file  Social History Narrative   Lives with wife   Caffeine use: 1-2 cups coffee per day   Right handed   Social Drivers of Health   Financial Resource Strain: Not on file  Food Insecurity: No Food Insecurity (09/06/2024)   Hunger Vital Sign    Worried About Running Out of Food in the Last Year: Never true    Ran Out of Food in the Last Year: Never true  Transportation Needs: No Transportation Needs (09/06/2024)   PRAPARE - Administrator, Civil Service (Medical): No    Lack of Transportation (Non-Medical): No  Physical Activity: Not on file  Stress: Not on file  Social Connections: Moderately Integrated (09/06/2024)   Social Connection and Isolation Panel    Frequency of Communication with Friends and Family: Three times a week    Frequency of Social Gatherings with Friends and Family: Three times a week    Attends Religious Services: 1 to 4 times per year    Active Member of Clubs or Organizations: No    Attends Engineer, Structural: Never    Marital Status: Married   Family History  Problem Relation Age of Onset   Hypertension Mother    Ovarian cancer Mother    Heart failure Father    Stroke Neg Hx    Allergies  Allergen Reactions   Aspirin  Anaphylaxis and Shortness Of Breath    Sulfa Antibiotics Hives and Shortness Of Breath   Quinine Derivatives Hives and Other (See Comments)    looks like he has the measles   Prior to Admission medications   Medication Sig Start Date End Date Taking? Authorizing Provider  acetaminophen  (TYLENOL ) 500 MG tablet Take 2 tablets (1,000 mg total) by mouth every 8 (eight) hours as needed for mild pain (pain score 1-3). 03/27/24  Yes Krishnan, Gokul, MD  atorvastatin  (LIPITOR ) 80 MG tablet Take 1 tablet (80 mg total) by mouth every evening. 08/01/24  Yes Lucien Orren SAILOR,  PA-C  carvedilol  (COREG ) 3.125 MG tablet Take 1 tablet (3.125 mg total) by mouth 2 (two) times daily with a meal. 08/01/24  Yes Conte, Tessa N, PA-C  Cholecalciferol  (VITAMIN D3) 50 MCG (2000 UT) capsule Take 2,000 Units by mouth daily.   Yes [provider]  clopidogrel  (PLAVIX ) 75 MG tablet Take 1 tablet (75 mg total) by mouth daily. 08/01/24  Yes Conte, Tessa N, PA-C  cyanocobalamin  (VITAMIN B12) 100 MCG tablet Take 100 mcg by mouth daily.   Yes [provider]  docusate sodium  (COLACE) 100 MG capsule Take 100 mg by mouth daily as needed for mild constipation.   Yes [provider]  feeding supplement (ENSURE PLUS HIGH PROTEIN) LIQD Take 237 mLs by mouth 2 (two) times daily between meals. Patient taking differently: Take 237 mLs by mouth daily. 03/27/24  Yes Krishnan, Gokul, MD  furosemide  (LASIX ) 40 MG tablet Take 1 tablet (40 mg total) by mouth daily. 08/01/24  Yes Conte, Tessa N, PA-C  losartan  (COZAAR ) 25 MG tablet Take 25 mg by mouth daily. 09/06/24  Yes [provider]  meloxicam (MOBIC) 15 MG tablet Take 15 mg by mouth daily. 09/06/24  Yes [provider]  Multiple Vitamins-Minerals (ONE-A-DAY MENS 50+) TABS Take 1 tablet by mouth daily with breakfast.   Yes [provider]  ondansetron  (ZOFRAN ) 4 MG tablet Take 4 mg by mouth every 6 (six) hours as needed. 06/17/24  Yes [provider]  pantoprazole   (PROTONIX ) 40 MG tablet Take 1 tablet (40 mg total) by mouth daily. 03/08/23  Yes Charlanne Groom, MD  polyethylene glycol (MIRALAX  / GLYCOLAX ) 17 g packet Take 17 g by mouth daily. Patient taking differently: Take 17 g by mouth daily as needed for mild constipation or moderate constipation. 02/24/24  Yes Will Almarie MATSU, MD   [name] Result Date: 09/06/2024 CLINICAL DATA:  Follow-up examination for C7 fracture. EXAM: MRI CERVICAL SPINE WITHOUT CONTRAST TECHNIQUE: Multiplanar, multisequence MR imaging of the cervical spine was performed. No intravenous contrast was administered. COMPARISON:  Prior CT from earlier the same day. FINDINGS: Alignment: Exaggeration of the normal cervical lordosis. No significant listhesis. Vertebrae: Compression fracture involving the C7 vertebral body with up to 75% height loss without bony retropulsion. This is largely chronic in appearance without significant residual marrow edema. Otherwise, vertebral body height maintained with no other acute or chronic fracture. Bone marrow signal intensity within normal limits. No worrisome osseous lesions. Reactive marrow edema present about the left C4-5 facet due to facet arthritis. Cord: Normal signal morphology. Posterior Fossa, vertebral arteries, paraspinal tissues: Patchy signal abnormality within the pons, consistent with chronic small vessel ischemic disease. Probable small benign arachnoid cyst noted at the supra cerebellar cistern. Craniocervical junction within normal limits. Paraspinous soft tissues within normal limits. Normal flow voids seen within the vertebral arteries bilaterally. Disc levels: C2-C3: Degenerative intervertebral disc space narrowing with diffuse disc bulge and endplate spurring. Moderate left facet arthrosis. No significant spinal stenosis. Foramina remain patent. C3-C4: Degenerative intervertebral disc space narrowing with diffuse disc osteophyte complex. Left greater than right  facet degeneration with bony ankylosis on the left. No more than mild spinal stenosis. Moderate left C4 foraminal narrowing. Right neural foramen remains patent. C4-C5: Degenerative disc space narrowing with diffuse disc osteophyte complex, asymmetric to the right. Posterior component flattens and partially faces the ventral thecal sac. Moderate left facet arthrosis with reactive marrow edema. Resultant mild-to-moderate spinal stenosis. Severe right C5 foraminal narrowing. Left neural foramina  remains patent. C5-C6: Advanced degenerative vertebral disc space narrowing with diffuse disc osteophyte complex, asymmetric to the right. Flattening and partial effacement of the ventral thecal sac. Resultant mild-to-moderate spinal stenosis. Severe right with moderate left C6 foraminal narrowing. C6-C7: Minimal disc bulge. Mild facet degeneration on the right. No spinal stenosis. Foramina remain patent. C7-T1: Negative interspace. Mild right-sided facet hypertrophy. No stenosis. IMPRESSION: 1. Chronic compression fracture involving the C7 vertebral body with up to 75% height loss without bony retropulsion. No acute fracture within the cervical spine. 2. Multilevel cervical spondylosis with resultant mild to moderate spinal stenosis at C4-5 and C5-6. 3. Multifactorial degenerative changes with resultant multilevel foraminal narrowing as above. Notable findings include moderate left C4 foraminal stenosis, severe right C5 foraminal narrowing, with severe right and moderate left C6 foraminal stenosis. 4. Reactive marrow edema about the left C4-5 facet due to facet arthritis. Finding could serve as a source for neck pain. Electronically Signed   By: Morene Hoard M.D.   On: 09/06/2024 19:03   DG Shoulder Right Result Date: 09/06/2024 CLINICAL DATA:  Trauma EXAM: RIGHT SHOULDER - 2+ VIEW COMPARISON:  None Available. FINDINGS: There is no evidence of fracture or dislocation. There is no evidence of arthropathy or other  focal bone abnormality. Soft tissues are unremarkable. IMPRESSION: Negative. Electronically Signed   By: Greig Pique M.D.   On: 09/06/2024 16:02   DG FEMUR PORT, 1V RIGHT Result Date: 09/06/2024 CLINICAL DATA:  Trauma EXAM: RIGHT FEMUR PORTABLE 1 VIEW COMPARISON:  Right femur x-ray 03/24/2024. FINDINGS: Right femoral intramedullary nail with hip screws again seen. There has been interval healing and callus formation of the proximal right femoral fracture. No definite acute fracture or dislocation. No hardware loosening. Peripheral vascular calcifications are present. IMPRESSION: 1. No acute fracture or dislocation. 2. Interval healing and callus formation of the proximal right femoral fracture. Electronically Signed   By: Greig Pique M.D.   On: 09/06/2024 15:56   DG Knee Right Port Result Date: 09/06/2024 CLINICAL DATA:  Clemens today at the grocery store walking to his car. Right knee pain. EXAM: PORTABLE RIGHT KNEE - 1-2 VIEW COMPARISON:  03/24/2024. FINDINGS: No fracture or bone lesion. Knee joint normally spaced and aligned. Distal aspect of an intramedullary rod is well-seated and positioned and unchanged. No joint effusion. Significant arterial vascular calcifications noted posteriorly. IMPRESSION: 1. No fracture or acute finding.  No joint abnormality. Electronically Signed   By: Alm Parkins M.D.   On: 09/06/2024 15:52   DG Elbow Complete Right Result Date: 09/06/2024 CLINICAL DATA:  Fall onto right side EXAM: RIGHT ELBOW - COMPLETE 4 VIEW COMPARISON:  None Available. FINDINGS: There is no evidence of fracture, dislocation, or joint effusion. There is no evidence of arthropathy or other focal bone abnormality. Soft tissues are unremarkable. IMPRESSION: No acute fracture or dislocation. Electronically Signed   By: Limin  Xu M.D.   On: 09/06/2024 15:52   DG Hand Complete Right Result Date: 09/06/2024 CLINICAL DATA:  Status post fall onto right side EXAM: RIGHT HAND - COMPLETE 3 VIEW  COMPARISON:  None Available. FINDINGS: There is no evidence of fracture or dislocation. Mild interphalangeal joint space narrowing. Positive ulnar variance. Soft tissues are unremarkable. IMPRESSION: 1. No acute fracture or dislocation. 2. Mild degenerative changes of the hand. Electronically Signed   By: Limin  Xu M.D.   On: 09/06/2024 15:51   DG Pelvis Portable Result Date: 09/06/2024 CLINICAL DATA:  Traumarotation differential diagnosis EXAM: PORTABLE PELVIS 1-2 VIEWS COMPARISON:  03/23/2024 FINDINGS: Bilateral femoral intramedullary nails are partially visualized.Cortical irregularity involving the parasymphyseal pubic bone extending into the inferior pubic ramus. Healing fracture of the right femoral neck/proximal femur.No dislocation.Moderate joint space loss of both hips. Multilevel degenerative disc disease of the spine. Peripheral vascular atherosclerosis. IMPRESSION: Longitudinally oriented fracture of the right parasymphyseal pubic bone extending into the inferior pubic ramus. Electronically Signed   By: Rogelia Myers M.D.   On: 09/06/2024 14:43   CT HEAD WO CONTRAST Result Date: 09/06/2024 CLINICAL DATA:  Fall around 1 hour ago. Patient recovering from hip surgery. Patient is on Plavix . EXAM: CT HEAD WITHOUT CONTRAST CT CERVICAL SPINE WITHOUT CONTRAST TECHNIQUE: Multidetector CT imaging of the head and cervical spine was performed following the standard protocol without intravenous contrast. Multiplanar CT image reconstructions of the cervical spine were also generated. RADIATION DOSE REDUCTION: This exam was performed according to the departmental dose-optimization program which includes automated exposure control, adjustment of the mA and/or kV according to patient size and/or use of iterative reconstruction technique. COMPARISON:  02/18/2024. FINDINGS: CT HEAD FINDINGS Brain: No evidence of acute infarction, hemorrhage, hydrocephalus, extra-axial collection or mass lesion/mass effect. There  is age-appropriate volume loss and mild periventricular white matter hypoattenuation consistent with chronic microvascular ischemic change. Vascular: No hyperdense vessel or unexpected calcification. Skull: Normal. Negative for fracture or focal lesion. Sinuses/Orbits: Globes and orbits are unremarkable. Sinuses are essentially clear. Other: None. CT CERVICAL SPINE FINDINGS Alignment: Normal. Skull base and vertebrae: Moderate wedge-shaped compression fracture of C7. This appears chronic, and was present on the exam from 02/18/2024, but has increased in severity. No other evidence of a fracture. No bone lesion. Soft tissues and spinal canal: No prevertebral fluid or swelling. No visible canal hematoma. Disc levels: Mild to moderate loss of disc height at C2-C3. Moderate loss of disc height at C3-C4. Marked loss of disc height at C4-C5 and C5-C6. Mild loss of disc height at C7-T1. Endplate spurring minor disc bulging. No convincing disc herniation. Degenerative changes are stable from prior exam. Upper chest: No acute findings. Other: None. IMPRESSION: HEAD CT 1. No acute intracranial abnormalities and no change from prior study. CERVICAL CT 1. No convincing acute fracture. 2. Moderate wedge-shaped compression fracture of C7, present on the prior cervical CT, but increased in overall severity. There is no adjacent edema to suggest an acute component. Electronically Signed   By: Alm Parkins M.D.   On: 09/06/2024 14:36   CT CERVICAL SPINE WO CONTRAST Result Date: 09/06/2024 CLINICAL DATA:  Fall around 1 hour ago. Patient recovering from hip surgery. Patient is on Plavix . EXAM: CT HEAD WITHOUT CONTRAST CT CERVICAL SPINE WITHOUT CONTRAST TECHNIQUE: Multidetector CT imaging of the head and cervical spine was performed following the standard protocol without intravenous contrast. Multiplanar CT image reconstructions of the cervical spine were also generated. RADIATION DOSE REDUCTION: This exam was performed according  to the departmental dose-optimization program which includes automated exposure control, adjustment of the mA and/or kV according to patient size and/or use of iterative reconstruction technique. COMPARISON:  02/18/2024. FINDINGS: CT HEAD FINDINGS Brain: No evidence of acute infarction, hemorrhage, hydrocephalus, extra-axial collection or mass lesion/mass effect. There is age-appropriate volume loss and mild periventricular white matter hypoattenuation consistent with chronic microvascular ischemic change. Vascular: No hyperdense vessel or unexpected calcification. Skull: Normal. Negative for fracture or focal lesion. Sinuses/Orbits: Globes and orbits are unremarkable. Sinuses are essentially clear. Other: None. CT CERVICAL SPINE FINDINGS Alignment: Normal. Skull base and vertebrae: Moderate wedge-shaped compression fracture of C7.  This appears chronic, and was present on the exam from 02/18/2024, but has increased in severity. No other evidence of a fracture. No bone lesion. Soft tissues and spinal canal: No prevertebral fluid or swelling. No visible canal hematoma. Disc levels: Mild to moderate loss of disc height at C2-C3. Moderate loss of disc height at C3-C4. Marked loss of disc height at C4-C5 and C5-C6. Mild loss of disc height at C7-T1. Endplate spurring minor disc bulging. No convincing disc herniation. Degenerative changes are stable from prior exam. Upper chest: No acute findings. Other: None. IMPRESSION: HEAD CT 1. No acute intracranial abnormalities and no change from prior study. CERVICAL CT 1. No convincing acute fracture. 2. Moderate wedge-shaped compression fracture of C7, present on the prior cervical CT, but increased in overall severity. There is no adjacent edema to suggest an acute component. Electronically Signed   By: Alm Parkins M.D.   On: 09/06/2024 14:36   DG Chest Port 1 View Result Date: 09/06/2024 CLINICAL DATA:  Trauma EXAM: PORTABLE CHEST - 1 VIEW COMPARISON:  March 23, 2024  FINDINGS: Low lung volumes with bronchovascular crowding. Patchy airspace opacities in the left lung base with blunting of the costophrenic sulcus. Mild cardiomegaly. Sternotomy wires and CABG markers. Tortuous aorta with aortic atherosclerosis. Diffuse osteopenia. Multilevel thoracic osteophytosis. IMPRESSION: Low lung volumes. Blunting of the left costophrenic sulcus with patchy retrocardiac airspace opacities, possibly representing atelectasis or aspiration with trace left pleural effusion. Electronically Signed   By: Rogelia Myers M.D.   On: 09/06/2024 14:19   Family History Reviewed and non-contributory, no pertinent history of problems with bleeding or anesthesia      Review of Systems 14 system ROS conducted and negative except for that noted in HPI   OBJECTIVE  Vitals:Patient Vitals for the past 8 hrs:  BP Temp Temp src Pulse Resp SpO2 Weight  09/07/24 0900 (!) 150/64 97.8 F (36.6 C) Oral 88 18 96 % --  09/07/24 0500 -- -- -- -- -- -- 63.5 kg   General: Alert, no acute distress C collar in place Cardiovascular: Warm extremities noted Respiratory: No cyanosis, no use of accessory musculature GI: No organomegaly, abdomen is soft and non-tender Skin: No lesions in the area of chief complaint other than those listed below in MSK exam.  Neurologic: Sensation intact distally save for the below mentioned MSK exam Psychiatric: Patient is competent for consent with normal mood and affect Lymphatic: No swelling obvious and reported other than the area involved in the exam below  Extremities  RUE: actively moves right upper extremity without pain. Nontender to palpation right proximal humerus. Full ROM of the right elbow. Bruising of the right hand. Mildly tender to palpation right wrist/hand. Warm well perfused hand RLE: pain with hip ROM. Tenderness to palpation of the right knee. No effusion noted. Small abrasion. Distal motor and sensory function intact. OOZ:jrupczob moves left  lower extremity without difficulty.     Test Results Imaging Xrays of the pelvis demonstrate a right sided pubic rami fracture  Labs cbc Recent Labs    09/06/24 1530 09/07/24 0138  WBC 8.0 5.0  HGB 10.9* 9.6*  HCT 32.4* 28.2*  PLT 154 130*    Labs inflam No results for input(s): CRP in the last 72 hours.  Invalid input(s): ESR  Labs coag No results for input(s): INR, PTT in the last 72 hours.  Invalid input(s): PT  Recent Labs    09/06/24 1530 09/07/24 0138  NA 139 134*  K 3.9 3.4*  CL 105 103  CO2 25 24  GLUCOSE 118* 187*  BUN 24* 24*  CREATININE 1.08 1.11  CALCIUM  8.4* 7.8*     ASSESSMENT AND PLAN: 83 y.o. male with the following: right pubic rami fracture  Patient has been admitted to the hospital. He will require PT/OT for mobilization purposes. No surgical intervention required.   - Weight Bearing Status/Activity: WBAT  - Additional recommended labs/tests: None  - VTE Prophylaxis: per primary team  - Pain control: PRN  - Follow-up plan: 2 weeks in office with Dr. Beverley Aleck Stalling, PA-C 09/07/24

## 2024-09-07 NOTE — Plan of Care (Signed)
  Problem: Education: Goal: Knowledge of General Education information will improve Description: Including pain rating scale, medication(s)/side effects and non-pharmacologic comfort measures Outcome: Progressing   Problem: Clinical Measurements: Goal: Ability to maintain clinical measurements within normal limits will improve Outcome: Progressing   Problem: Nutrition: Goal: Adequate nutrition will be maintained Outcome: Progressing   Problem: Safety: Goal: Ability to remain free from injury will improve Outcome: Progressing   Problem: Skin Integrity: Goal: Risk for impaired skin integrity will decrease Outcome: Progressing   

## 2024-09-07 NOTE — Consult Note (Signed)
 ORTHOPAEDIC CONSULTATION  REQUESTING PHYSICIAN: Roann Gouty, MD  Chief Complaint: fall  HPI: Matthew Liu is a 83 y.o. male who complains of  falling yesterday in the parking lot of the grocery store. He was able to get up and even drove himself to the ER but having pain in the RUE and RLE.  Imaging shows a longitudinally oriented fracture of the right parasymphyseal pubic bone extending into the inferior pubic ramus.    Orthopedics was consulted for evaluation.    + history of MI and CVA. - h/o DVT, PE.      Past Medical History:  Diagnosis Date   Acute ischemic stroke (HCC) 09/15/2015   Ankle fracture, left 2009   rod placed   Ankle fracture, left    rod placed   Anxiety    Aspirin  allergy    a. Anaphylactic rxn.   Barrett's esophagus    CAD (coronary artery disease)    a. Inf STEMI 07/2013: s/p DES x 2 from ostial to [facility] 07/13/13. Residual Cx disease for med rx (PCI if recurrent pain).   Controlled gout    Dyslipidemia    ED (erectile dysfunction)    Edema of both legs    Gastric polyp    GERD (gastroesophageal reflux disease)    Hiatal hernia    History of nuclear stress test    Nuclear stress test 5/19: EF 53, no ischemia, no infarction; Low Risk   Hydrocele of testis    Hyperglycemia    a. A1C 5.8 in 07/2013.   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   LIMA to LAD, SVG to Diag, Sequential SVG to OM2-OM3, SVG to RCA, EVH via right thigh and leg   Unstable angina [facility])    Past Surgical History:  Procedure Laterality Date   BACK SURGERY     L1 kyphoplasty by Dr Malcolm 03/2018     thoractic 02-2018   cataract surgery      bilateral    COLONOSCOPY     CORONARY ANGIOPLASTY     stents    CORONARY ARTERY BYPASS GRAFT N/A 12/12/2018   Procedure:  CORONARY ARTERY BYPASS GRAFTING (CABG);  Surgeon: Dusty Sudie DEL, MD;  Location: [facility] OR;  Service: Open Heart Surgery;  Laterality: N/A;   ESOPHAGOGASTRODUODENOSCOPY  04/04/2016   Short-segment Barrett's esophagus (biopsied) Gastric polyps (status post polypectomy x1) Hiatal hernia   HYDROCELE EXCISION Left 07/17/2014   Procedure: LEFT HYDROCELECTOMY;  Surgeon: Norleen JINNY Seltzer, MD;  Location: [facility] ORS;  Service: Urology;  Laterality: Left;   INTRAMEDULLARY (IM) NAIL INTERTROCHANTERIC Left 02/19/2024   Procedure: FIXATION, FRACTURE, INTERTROCHANTERIC, WITH INTRAMEDULLARY ROD;  Surgeon: Kendal Franky SQUIBB, MD;  Location: [facility] OR;  Service: Orthopedics;  Laterality: Left;   left ankle surgery  1999   LEFT HEART CATH AND CORONARY ANGIOGRAPHY N/A 12/11/2018   Procedure: LEFT HEART CATH AND CORONARY ANGIOGRAPHY;  Surgeon: Claudene Victory ORN, MD;  Location: [facility] INVASIVE CV LAB;  Service: Cardiovascular;  Laterality: N/A;   LEFT HEART CATHETERIZATION WITH CORONARY ANGIOGRAM N/A 07/13/2013   Procedure: LEFT HEART CATHETERIZATION WITH CORONARY ANGIOGRAM;  Surgeon: Victory ORN Claudene DOUGLAS, MD;  Location: [facility] CATH LAB;  Service: Cardiovascular;  Laterality: N/A;   ORIF HIP FRACTURE  Right 03/24/2024   Procedure: OPEN REDUCTION INTERNAL FIXATION HIP;  Surgeon: Edna Toribio LABOR, MD;  Location: [facility] OR;  Service: Orthopedics;  Laterality: Right;   PERCUTANEOUS CORONARY STENT INTERVENTION (PCI-S)  07/13/2013   Procedure: PERCUTANEOUS CORONARY STENT INTERVENTION (PCI-S);  Surgeon: Victory LELON Claudene DOUGLAS, MD;  Location: [facility] CATH LAB;  Service: Cardiovascular;;   TEE WITHOUT CARDIOVERSION N/A 12/12/2018   Procedure: TRANSESOPHAGEAL ECHOCARDIOGRAM (TEE);  Surgeon: Dusty Sudie DEL, MD;  Location: [facility] OR;  Service: Open Heart Surgery;  Laterality: N/A;   TONSILLECTOMY     Social History   Socioeconomic History   Marital status: Married    Spouse name: Pat   Number of children: 2   Years of education: 12   Highest education level: Not on file   Occupational History   Occupation: Chiropractor  Tobacco Use   Smoking status: Never   Smokeless tobacco: Never  Vaping Use   Vaping status: Never Used  Substance and Sexual Activity   Alcohol use: Yes    Comment: occasional beer    Drug use: No   Sexual activity: Not Currently  Other Topics Concern   Not on file  Social History Narrative   Lives with wife   Caffeine use: 1-2 cups coffee per day   Right handed   Social Drivers of Health   Financial Resource Strain: Not on file  Food Insecurity: No Food Insecurity (09/06/2024)   Hunger Vital Sign    Worried About Running Out of Food in the Last Year: Never true    Ran Out of Food in the Last Year: Never true  Transportation Needs: No Transportation Needs (09/06/2024)   PRAPARE - Administrator, Civil Service (Medical): No    Lack of Transportation (Non-Medical): No  Physical Activity: Not on file  Stress: Not on file  Social Connections: Moderately Integrated (09/06/2024)   Social Connection and Isolation Panel    Frequency of Communication with Friends and Family: Three times a week    Frequency of Social Gatherings with Friends and Family: Three times a week    Attends Religious Services: 1 to 4 times per year    Active Member of Clubs or Organizations: No    Attends Engineer, Structural: Never    Marital Status: Married   Family History  Problem Relation Age of Onset   Hypertension Mother    Ovarian cancer Mother    Heart failure Father    Stroke Neg Hx    Allergies  Allergen Reactions   Aspirin  Anaphylaxis and Shortness Of Breath   Quinine Derivatives Hives and Other (See Comments)    looks like he has the measles   Prior to Admission medications   Medication Sig Start Date End Date Taking? Authorizing Provider  acetaminophen  (TYLENOL ) 500 MG tablet Take 2 tablets (1,000 mg total) by mouth every 8 (eight) hours as needed for mild pain (pain score 1-3).  03/27/24   Krishnan, Gokul, MD  atorvastatin  (LIPITOR ) 80 MG tablet Take 1 tablet (80 mg total) by mouth every evening. 08/01/24   Conte, Tessa N, PA-C  carvedilol  (COREG ) 3.125 MG tablet Take 1 tablet (3.125 mg total) by mouth 2 (two) times daily with a meal. 08/01/24   Lucien Orren SAILOR, PA-C  Cholecalciferol  (VITAMIN D3) 50 MCG (2000 UT) capsule Take 2,000 Units by mouth daily.    [provider]  clopidogrel  (PLAVIX ) 75 MG tablet Take 1 tablet (75 mg total) by mouth daily. 08/01/24  Lucien Orren SAILOR, PA-C  cyanocobalamin  (VITAMIN B12) 100 MCG tablet Take 100 mcg by mouth daily.    [provider]  docusate sodium  (COLACE) 100 MG capsule Take 100 mg by mouth daily.    [provider]  feeding supplement (ENSURE PLUS HIGH PROTEIN) LIQD Take 237 mLs by mouth 2 (two) times daily between meals. 03/27/24   Krishnan, Gokul, MD  furosemide  (LASIX ) 40 MG tablet Take 1 tablet (40 mg total) by mouth daily. 08/01/24   Lucien Orren SAILOR, PA-C  Multiple Vitamins-Minerals (ONE-A-DAY MENS 50+) TABS Take 1 tablet by mouth daily with breakfast.    [provider]  ondansetron  (ZOFRAN ) 4 MG tablet Take 4 mg by mouth every 6 (six) hours as needed. 06/17/24   [provider]  pantoprazole  (PROTONIX ) 40 MG tablet Take 1 tablet (40 mg total) by mouth daily. 03/08/23   Charlanne Groom, MD  polyethylene glycol (MIRALAX  / GLYCOLAX ) 17 g packet Take 17 g by mouth daily. Patient taking differently: Take 17 g by mouth daily as needed for mild constipation or moderate constipation. 02/24/24   Will Almarie MATSU, MD   [name] Result Date: 09/06/2024 CLINICAL DATA:  Follow-up examination for C7 fracture. EXAM: MRI CERVICAL SPINE WITHOUT CONTRAST TECHNIQUE: Multiplanar, multisequence MR imaging of the cervical spine was performed. No intravenous contrast was administered. COMPARISON:  Prior CT from earlier the same day. FINDINGS: Alignment: Exaggeration of the normal  cervical lordosis. No significant listhesis. Vertebrae: Compression fracture involving the C7 vertebral body with up to 75% height loss without bony retropulsion. This is largely chronic in appearance without significant residual marrow edema. Otherwise, vertebral body height maintained with no other acute or chronic fracture. Bone marrow signal intensity within normal limits. No worrisome osseous lesions. Reactive marrow edema present about the left C4-5 facet due to facet arthritis. Cord: Normal signal morphology. Posterior Fossa, vertebral arteries, paraspinal tissues: Patchy signal abnormality within the pons, consistent with chronic small vessel ischemic disease. Probable small benign arachnoid cyst noted at the supra cerebellar cistern. Craniocervical junction within normal limits. Paraspinous soft tissues within normal limits. Normal flow voids seen within the vertebral arteries bilaterally. Disc levels: C2-C3: Degenerative intervertebral disc space narrowing with diffuse disc bulge and endplate spurring. Moderate left facet arthrosis. No significant spinal stenosis. Foramina remain patent. C3-C4: Degenerative intervertebral disc space narrowing with diffuse disc osteophyte complex. Left greater than right facet degeneration with bony ankylosis on the left. No more than mild spinal stenosis. Moderate left C4 foraminal narrowing. Right neural foramen remains patent. C4-C5: Degenerative disc space narrowing with diffuse disc osteophyte complex, asymmetric to the right. Posterior component flattens and partially faces the ventral thecal sac. Moderate left facet arthrosis with reactive marrow edema. Resultant mild-to-moderate spinal stenosis. Severe right C5 foraminal narrowing. Left neural foramina remains patent. C5-C6: Advanced degenerative vertebral disc space narrowing with diffuse disc osteophyte complex, asymmetric to the right. Flattening and partial effacement of the ventral thecal sac. Resultant  mild-to-moderate spinal stenosis. Severe right with moderate left C6 foraminal narrowing. C6-C7: Minimal disc bulge. Mild facet degeneration on the right. No spinal stenosis. Foramina remain patent. C7-T1: Negative interspace. Mild right-sided facet hypertrophy. No stenosis. IMPRESSION: 1. Chronic compression fracture involving the C7 vertebral body with up to 75% height loss without bony retropulsion. No acute fracture within the cervical spine. 2. Multilevel cervical spondylosis with resultant mild to moderate spinal stenosis at C4-5 and C5-6. 3. Multifactorial degenerative changes with resultant multilevel foraminal narrowing as above. Notable findings  include moderate left C4 foraminal stenosis, severe right C5 foraminal narrowing, with severe right and moderate left C6 foraminal stenosis. 4. Reactive marrow edema about the left C4-5 facet due to facet arthritis. Finding could serve as a source for neck pain. Electronically Signed   By: Morene Hoard M.D.   On: 09/06/2024 19:03   DG Shoulder Right Result Date: 09/06/2024 CLINICAL DATA:  Trauma EXAM: RIGHT SHOULDER - 2+ VIEW COMPARISON:  None Available. FINDINGS: There is no evidence of fracture or dislocation. There is no evidence of arthropathy or other focal bone abnormality. Soft tissues are unremarkable. IMPRESSION: Negative. Electronically Signed   By: Greig Pique M.D.   On: 09/06/2024 16:02   DG FEMUR PORT, 1V RIGHT Result Date: 09/06/2024 CLINICAL DATA:  Trauma EXAM: RIGHT FEMUR PORTABLE 1 VIEW COMPARISON:  Right femur x-ray 03/24/2024. FINDINGS: Right femoral intramedullary nail with hip screws again seen. There has been interval healing and callus formation of the proximal right femoral fracture. No definite acute fracture or dislocation. No hardware loosening. Peripheral vascular calcifications are present. IMPRESSION: 1. No acute fracture or dislocation. 2. Interval healing and callus formation of the proximal right femoral fracture.  Electronically Signed   By: Greig Pique M.D.   On: 09/06/2024 15:56   DG Knee Right Port Result Date: 09/06/2024 CLINICAL DATA:  Clemens today at the grocery store walking to his car. Right knee pain. EXAM: PORTABLE RIGHT KNEE - 1-2 VIEW COMPARISON:  03/24/2024. FINDINGS: No fracture or bone lesion. Knee joint normally spaced and aligned. Distal aspect of an intramedullary rod is well-seated and positioned and unchanged. No joint effusion. Significant arterial vascular calcifications noted posteriorly. IMPRESSION: 1. No fracture or acute finding.  No joint abnormality. Electronically Signed   By: Alm Parkins M.D.   On: 09/06/2024 15:52   DG Elbow Complete Right Result Date: 09/06/2024 CLINICAL DATA:  Fall onto right side EXAM: RIGHT ELBOW - COMPLETE 4 VIEW COMPARISON:  None Available. FINDINGS: There is no evidence of fracture, dislocation, or joint effusion. There is no evidence of arthropathy or other focal bone abnormality. Soft tissues are unremarkable. IMPRESSION: No acute fracture or dislocation. Electronically Signed   By: Limin  Xu M.D.   On: 09/06/2024 15:52   DG Hand Complete Right Result Date: 09/06/2024 CLINICAL DATA:  Status post fall onto right side EXAM: RIGHT HAND - COMPLETE 3 VIEW COMPARISON:  None Available. FINDINGS: There is no evidence of fracture or dislocation. Mild interphalangeal joint space narrowing. Positive ulnar variance. Soft tissues are unremarkable. IMPRESSION: 1. No acute fracture or dislocation. 2. Mild degenerative changes of the hand. Electronically Signed   By: Limin  Xu M.D.   On: 09/06/2024 15:51   DG Pelvis Portable Result Date: 09/06/2024 CLINICAL DATA:  Traumarotation differential diagnosis EXAM: PORTABLE PELVIS 1-2 VIEWS COMPARISON:  03/23/2024 FINDINGS: Bilateral femoral intramedullary nails are partially visualized.Cortical irregularity involving the parasymphyseal pubic bone extending into the inferior pubic ramus. Healing fracture of the right femoral  neck/proximal femur.No dislocation.Moderate joint space loss of both hips. Multilevel degenerative disc disease of the spine. Peripheral vascular atherosclerosis. IMPRESSION: Longitudinally oriented fracture of the right parasymphyseal pubic bone extending into the inferior pubic ramus. Electronically Signed   By: Rogelia Myers M.D.   On: 09/06/2024 14:43   CT HEAD WO CONTRAST Result Date: 09/06/2024 CLINICAL DATA:  Fall around 1 hour ago. Patient recovering from hip surgery. Patient is on Plavix . EXAM: CT HEAD WITHOUT CONTRAST CT CERVICAL SPINE WITHOUT CONTRAST TECHNIQUE: Multidetector CT imaging of the  head and cervical spine was performed following the standard protocol without intravenous contrast. Multiplanar CT image reconstructions of the cervical spine were also generated. RADIATION DOSE REDUCTION: This exam was performed according to the departmental dose-optimization program which includes automated exposure control, adjustment of the mA and/or kV according to patient size and/or use of iterative reconstruction technique. COMPARISON:  02/18/2024. FINDINGS: CT HEAD FINDINGS Brain: No evidence of acute infarction, hemorrhage, hydrocephalus, extra-axial collection or mass lesion/mass effect. There is age-appropriate volume loss and mild periventricular white matter hypoattenuation consistent with chronic microvascular ischemic change. Vascular: No hyperdense vessel or unexpected calcification. Skull: Normal. Negative for fracture or focal lesion. Sinuses/Orbits: Globes and orbits are unremarkable. Sinuses are essentially clear. Other: None. CT CERVICAL SPINE FINDINGS Alignment: Normal. Skull base and vertebrae: Moderate wedge-shaped compression fracture of C7. This appears chronic, and was present on the exam from 02/18/2024, but has increased in severity. No other evidence of a fracture. No bone lesion. Soft tissues and spinal canal: No prevertebral fluid or swelling. No visible canal hematoma. Disc  levels: Mild to moderate loss of disc height at C2-C3. Moderate loss of disc height at C3-C4. Marked loss of disc height at C4-C5 and C5-C6. Mild loss of disc height at C7-T1. Endplate spurring minor disc bulging. No convincing disc herniation. Degenerative changes are stable from prior exam. Upper chest: No acute findings. Other: None. IMPRESSION: HEAD CT 1. No acute intracranial abnormalities and no change from prior study. CERVICAL CT 1. No convincing acute fracture. 2. Moderate wedge-shaped compression fracture of C7, present on the prior cervical CT, but increased in overall severity. There is no adjacent edema to suggest an acute component. Electronically Signed   By: Alm Parkins M.D.   On: 09/06/2024 14:36   CT CERVICAL SPINE WO CONTRAST Result Date: 09/06/2024 CLINICAL DATA:  Fall around 1 hour ago. Patient recovering from hip surgery. Patient is on Plavix . EXAM: CT HEAD WITHOUT CONTRAST CT CERVICAL SPINE WITHOUT CONTRAST TECHNIQUE: Multidetector CT imaging of the head and cervical spine was performed following the standard protocol without intravenous contrast. Multiplanar CT image reconstructions of the cervical spine were also generated. RADIATION DOSE REDUCTION: This exam was performed according to the departmental dose-optimization program which includes automated exposure control, adjustment of the mA and/or kV according to patient size and/or use of iterative reconstruction technique. COMPARISON:  02/18/2024. FINDINGS: CT HEAD FINDINGS Brain: No evidence of acute infarction, hemorrhage, hydrocephalus, extra-axial collection or mass lesion/mass effect. There is age-appropriate volume loss and mild periventricular white matter hypoattenuation consistent with chronic microvascular ischemic change. Vascular: No hyperdense vessel or unexpected calcification. Skull: Normal. Negative for fracture or focal lesion. Sinuses/Orbits: Globes and orbits are unremarkable. Sinuses are essentially clear. Other:  None. CT CERVICAL SPINE FINDINGS Alignment: Normal. Skull base and vertebrae: Moderate wedge-shaped compression fracture of C7. This appears chronic, and was present on the exam from 02/18/2024, but has increased in severity. No other evidence of a fracture. No bone lesion. Soft tissues and spinal canal: No prevertebral fluid or swelling. No visible canal hematoma. Disc levels: Mild to moderate loss of disc height at C2-C3. Moderate loss of disc height at C3-C4. Marked loss of disc height at C4-C5 and C5-C6. Mild loss of disc height at C7-T1. Endplate spurring minor disc bulging. No convincing disc herniation. Degenerative changes are stable from prior exam. Upper chest: No acute findings. Other: None. IMPRESSION: HEAD CT 1. No acute intracranial abnormalities and no change from prior study. CERVICAL CT 1. No convincing acute fracture. 2.  Moderate wedge-shaped compression fracture of C7, present on the prior cervical CT, but increased in overall severity. There is no adjacent edema to suggest an acute component. Electronically Signed   By: Alm Parkins M.D.   On: 09/06/2024 14:36   DG Chest Port 1 View Result Date: 09/06/2024 CLINICAL DATA:  Trauma EXAM: PORTABLE CHEST - 1 VIEW COMPARISON:  March 23, 2024 FINDINGS: Low lung volumes with bronchovascular crowding. Patchy airspace opacities in the left lung base with blunting of the costophrenic sulcus. Mild cardiomegaly. Sternotomy wires and CABG markers. Tortuous aorta with aortic atherosclerosis. Diffuse osteopenia. Multilevel thoracic osteophytosis. IMPRESSION: Low lung volumes. Blunting of the left costophrenic sulcus with patchy retrocardiac airspace opacities, possibly representing atelectasis or aspiration with trace left pleural effusion. Electronically Signed   By: Rogelia Myers M.D.   On: 09/06/2024 14:19    Positive ROS: All other systems have been reviewed and were otherwise negative with the exception of those mentioned in the HPI and as  above.  Objective: Labs cbc Recent Labs    09/06/24 1530 09/07/24 0138  WBC 8.0 5.0  HGB 10.9* 9.6*  HCT 32.4* 28.2*  PLT 154 130*    Labs inflam No results for input(s): CRP in the last 72 hours.  Invalid input(s): ESR  Labs coag No results for input(s): INR, PTT in the last 72 hours.  Invalid input(s): PT  Recent Labs    09/06/24 1530 09/07/24 0138  NA 139 134*  K 3.9 3.4*  CL 105 103  CO2 25 24  GLUCOSE 118* 187*  BUN 24* 24*  CREATININE 1.08 1.11  CALCIUM  8.4* 7.8*    Physical Exam: Vitals:   09/07/24 0337 09/07/24 0900  BP: 126/67 (!) 150/64  Pulse:  88  Resp: 17 18  Temp: 98.2 F (36.8 C) 97.8 F (36.6 C)  SpO2: 100% 96%    Assessment / Plan: Principal Problem:   Falls, initial encounter    Will treat non-op for now. Ok to work with PT/OT on mobilization.    Weightbearing: WBAT RLE  VTE prophylaxis: per primary, SCDs, ambulation Pain control: PRN per primary    Gerard CHRISTELLA Large PA-C Office [phone] 09/07/2024 9:18 AM

## 2024-09-07 NOTE — Progress Notes (Signed)
  Progress Note   Patient: Matthew Liu FMW:[id number] DOB: 1941-07-02 DOA: 09/06/2024     0 DOS: the patient was seen and examined on 09/07/2024   Brief hospital course: Matthew Liu is a 83 y.o. male with past medical history  of  CAD status post CABG, IPF, OSA, history of CVA, Parkinson disease with mild cognitive impairment, and left hip fracture status postsurgical repair on 02/19/2024, being admitted today for a fall and observation. Patient drove himself to [employer] which she does typically send regulatory oriented and independent at baseline.  After getting his groceries and on his way back he was walking towards the, where he fell over the divider in the parking lot.  No other complaints today of chest pain palpitation dizziness or loss of consciousness.  Assessment and Plan: 83 year old male W/PMH of Parkinson's disease, CAD s/p CABG, IPF, OSA, history of CVA, mild cognitive impairment who was brought in for a fall with C7 with compression fracture appears to be old and right pubic rami fracture.  1.  Falls - Patient has a history of parkinsonism and frequent falls. - Fall precaution - Patient does not take any parkinsonian medications  2.  C7 wedge compression fracture - Continue C-spine collar - Neurosurgery consulted and they advised that this fracture could be old - MRI spine showed no acute fracture  3.  Right pubic rami fracture - Orthopedic consult appreciated - Advised nonoperative and  WBAT - Pain control - PT/OT  4.  PAD/CAD s/p CABG - Clinically stable - Resume Plavix , Coreg   5.  Parkinson's disease with mild cognitive impairment - No parkinsonian medication at this point at home  6.  GERD/history of Barrett's esophagus - Continue IV PPI and aspiration precautions      Subjective: Denies any nausea vomiting, complaints of back pain  Physical Exam: Vitals:   09/07/24 0015 09/07/24 0337 09/07/24 0500 09/07/24 0900  BP: (!) 107/47 126/67  (!) 150/64   Pulse: 85   88  Resp: 13 17  18   Temp: 98.2 F (36.8 C) 98.2 F (36.8 C)  97.8 F (36.6 C)  TempSrc:    Oral  SpO2: 94% 100%  96%  Weight:   63.5 kg   Height:       Constitutional: Alert, awake, calm, comfortable HEENT: Neck supple, hard collar present Respiratory: clear to auscultation bilaterally, no wheezing, no crackles. Normal respiratory effort. No accessory muscle use.  Cardiovascular: Regular rate and rhythm, no murmurs / rubs / gallops. No extremity edema. 2+ pedal pulses. No carotid bruits.  Abdomen: no tenderness, no masses palpated. No hepatosplenomegaly. Bowel sounds positive.  Musculoskeletal: no clubbing / cyanosis. No joint deformity upper and lower extremities. Good ROM, no contractures. Normal muscle tone.  Skin: no rashes, lesions, ulcers. No induration Neurologic: CN 2-12 grossly intact. Sensation intact, DTR normal. Strength 5/5 x all 4 extremities.   Data Reviewed:  Reviewed  Family Communication: None available  Disposition: Status is: Observation The patient remains OBS appropriate and will d/c before 2 midnights.  Planned Discharge Destination: Skilled nursing facility    Time spent: 35 minutes  Author: Ileen Kahre, MD 09/07/2024 4:30 PM  For on call review [email or web address].

## 2024-09-07 NOTE — Care Management Obs Status (Signed)
 MEDICARE OBSERVATION STATUS NOTIFICATION   Patient Details  Name: JULES BATY MRN: [id number] Date of Birth: [birth year]   Medicare Observation Status Notification Given:  No (Patient sleeping and did not wake up when [facility] announced being in the room.)    Robynn Eileen Hoose, RN 09/07/2024, 2:11 PM

## 2024-09-07 NOTE — Hospital Course (Signed)
 Matthew Liu is a 83 y.o. male with past medical history of CAD status post CABG, IPF, OSA, history of CVA, Parkinson disease with mild cognitive impairment, and left hip fracture status postsurgical repair on 02/19/2024, who was admitted after fall.  Patient drove himself to [employer] and after getting his groceries and on his way back he was walking towards the, where he fell over the divider in the parking lot. He did not lose consciousness. Futher workup revealed a C7 Wedge compression fracture, Right sided Pubic rami fracture, and New onset A Fib with RVR. Cardiology consulted for further evaluation and they are currently adjusting meds and considering obtaining EP evaluation to see if the patient is watchman candidate. PT/OT recommending SNF.  Assessment and Plan:  Falls: Patient has a history of parkinsonism and frequent falls. C/w Fall precautions. Patient does not take any parkinsonian medications. PT/OT recommending SNF   C7 wedge compression fracture, old: Neurosurgery consulted and they advised that this fracture is old. 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 No acute fracture within the cervical spine. The MRI also showed  Multilevel cervical spondylosis with resultant mild to moderate spinal stenosis at C4-5 and C5-6 as well as  Multifactorial degenerative changes with resultant multilevel foraminal narrowing as above. Notable findings include moderate left C4 foraminal stenosis, severe right C5 foraminal narrowing, with severe right and moderate left C6 foraminal stenosis. -MRI also showed Reactive marrow edema about the left C4-5 facet due to facet arthritis. Finding could serve as a source for neck pain. -No need for hard collar -C/w Fall Precautions   Right Pubic Rami Fracture: Orthopedic consult appreciated and  Advised non-operative management and WBAT; C/w Pain control with  Acetaminophen  po/RC q6hprn Mild Pain and Oxycodone  5 mg po q4hprn Moderate and Severe Pain -PT/OT recommending SNF and bed is chosen at [facility] with [facility] pending   New Onset A Fib with RVR s/p Conversion to NSR: CHA2DS2-VASc Score of 7; Checking TSH. On Metoprolol  Tartrate 12.5 mg q6h. CTM on Telemetry. Cardiology planning on stopping current meds and switching to Metoprolol  Succinate 50 mg po Daily and starting AC w/ Rivaroxaban 15 mg po Daily. ECHO as below.  Cardiology is restarting his home furosemide  40 mg p.o. daily.  Given that he has high risk for CVA given his elevated CHA2DS2-VASc score cardiology is debating about initiating DOAC.  Ortho has cleared him for DOAC and they will consider starting him on 5 mg Eliquis but will only use in the interim until they decide if he is going to go to SNF and also get EP input.  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 Patient being started on Metoprolol  Succinate 50 mg po Daily. Strict I's and O's and Daily Weights. ECHo done and showed EF of 45-50% w/ global hypokinesis and indeterminate LVDP.  Cardiology also recommending considering starting Spironolactone 12.5 mg p.o. daily added and SGLT2 inhibitor prior to discharge but today have initiated losartan  12.5 mg p.o. daily and then transitioning to Entresto if his blood pressure tolerates.  Continue with home furosemide  40 g p.o. daily.  BNP was elevated 273.5   PAD/CAD s/p DES and CABG: Clinically stable. Resumed Clopidogrel  75 mg po Daily and Atorvastatin  80 mg po Daily. C/w Metoprolol  Succinate 50 mg  po Daily.  Cardiology recommends no aspirin  since he may end up being on a DOAC.  Cardiology feels it likely occurred from his fall and orthopedic issues but they will consider monitoring with an outpatient stress PET/CT to rule out ischemia  HLD: Cardiology  checked Lipid Panel and it showed a total cholesterol/HDL ratio of 2.1, cholesterol level of 99, HDL 48, LDL 44, Triglycerides of 37, VLDL of 7. C/w Atorvastatin  80 mg po Daily.   Essential HTN: PTA was on Carvedilol  3.125 mg po BID and Losartan  25 mg po Daily. Cardiology planning on changing meds and now Metoprolol  Succinate 50 mg po Daily and Losartan  12.5 mg po Daily. CTM BP per Protocol. Last BP reading was elevated at 165/65  Macrocytic Anemia: Hgb/Hct Trend:  Recent Labs  Lab 09/06/24 1351 09/06/24 1351 09/06/24 1530 09/07/24 0138 09/08/24 0251 09/09/24 1041  HGB 12.2*  --  10.9* 9.6* 9.3* 9.5*  HCT 36.0*  --  32.4* 28.2* 27.6* 27.9*  MCV  --    < > 103.5* 102.5* 103.4* 104.5*   < > = values in this interval not displayed.  -Check Anemia Panel. CTM for S/Sx of Bleeding; No overt bleeding noted. Repeat CBC today pending  Thrombocytopenia: Plt Count Trend:  Recent Labs  Lab 09/06/24 1530 09/07/24 0138 09/08/24 0251 09/09/24 1041  PLT 154 130* 109* 122*  -CTM for S/Sx of Bleeding; no overt bleeding noted. Repeat CBC today pending    Parkinson's Disease with mild cognitive impairment: Not taking Parkinsonian medications at this point at home. Will need Outpt Follow up with Neurology    GERD/History of Barrett's esophagus/GI Prophylaxis: IV PPI changed to po Pantoprazole  40 mg po Daily. C/w Aspiration Precautions  OSA: C/w CPAP qHS  Hypoalbuminemia: Patient's Albumin  Lvl went from 3.6 -> 2.9 -> 2.8 x2. CTM & Trend & repeat CMPt today pending

## 2024-09-08 DIAGNOSIS — S32501A Unspecified fracture of right pubis, initial encounter for closed fracture: Secondary | ICD-10-CM | POA: Diagnosis not present

## 2024-09-08 DIAGNOSIS — W19XXXA Unspecified fall, initial encounter: Secondary | ICD-10-CM | POA: Diagnosis not present

## 2024-09-08 LAB — COMPREHENSIVE METABOLIC PANEL WITH GFR
ALT: 16 U/L (ref 0–44)
AST: 24 U/L (ref 15–41)
Albumin: 2.8 g/dL — ABNORMAL LOW (ref 3.5–5.0)
Alkaline Phosphatase: 123 U/L (ref 38–126)
Anion gap: 6 (ref 5–15)
BUN: 33 mg/dL — ABNORMAL HIGH (ref 8–23)
CO2: 24 mmol/L (ref 22–32)
Calcium: 8.1 mg/dL — ABNORMAL LOW (ref 8.9–10.3)
Chloride: 104 mmol/L (ref 98–111)
Creatinine, Ser: 1.32 mg/dL — ABNORMAL HIGH (ref 0.61–1.24)
GFR, Estimated: 53 mL/min — ABNORMAL LOW (ref >60)
Glucose, Bld: 128 mg/dL — ABNORMAL HIGH (ref 70–99)
Potassium: 3.7 mmol/L (ref 3.5–5.1)
Sodium: 134 mmol/L — ABNORMAL LOW (ref 135–145)
Total Bilirubin: 1.2 mg/dL (ref 0.0–1.2)
Total Protein: 5.3 g/dL — ABNORMAL LOW (ref 6.5–8.1)

## 2024-09-08 LAB — MAGNESIUM: Magnesium: 1.8 mg/dL (ref 1.7–2.4)

## 2024-09-08 LAB — CBC
HCT: 27.6 % — ABNORMAL LOW (ref 39.0–52.0)
Hemoglobin: 9.3 g/dL — ABNORMAL LOW (ref 13.0–17.0)
MCH: 34.8 pg — ABNORMAL HIGH (ref 26.0–34.0)
MCHC: 33.7 g/dL (ref 30.0–36.0)
MCV: 103.4 fL — ABNORMAL HIGH (ref 80.0–100.0)
Platelets: 109 K/uL — ABNORMAL LOW (ref 150–400)
RBC: 2.67 MIL/uL — ABNORMAL LOW (ref 4.22–5.81)
RDW: 14.2 % (ref 11.5–15.5)
WBC: 6.3 K/uL (ref 4.0–10.5)
nRBC: 0 % (ref 0.0–0.2)

## 2024-09-08 LAB — TROPONIN I (HIGH SENSITIVITY): Troponin I (High Sensitivity): 13 ng/L (ref <18)

## 2024-09-08 MED ORDER — DILTIAZEM HCL 25 MG/5ML IV SOLN: 10.0000 mg | Freq: Once | INTRAVENOUS | Status: DC

## 2024-09-08 MED ORDER — DILTIAZEM HCL 25 MG/5ML IV SOLN
10.0000 mg | Freq: Once | INTRAVENOUS | Status: AC
  Administered 2024-09-08: 10 mg via INTRAVENOUS
  Filled 2024-09-08: qty 5

## 2024-09-08 MED ORDER — SODIUM CHLORIDE 0.9 % IV BOLUS
500.0000 mL | Freq: Once | INTRAVENOUS | Status: AC
  Administered 2024-09-08: 500 mL via INTRAVENOUS

## 2024-09-08 MED ORDER — ONDANSETRON HCL 4 MG/2ML IJ SOLN
4.0000 mg | Freq: Four times a day (QID) | INTRAMUSCULAR | Status: DC | PRN
  Administered 2024-09-08: 4 mg via INTRAVENOUS
  Filled 2024-09-08: qty 2

## 2024-09-08 MED ORDER — METOPROLOL TARTRATE 12.5 MG HALF TABLET
12.5000 mg | ORAL_TABLET | Freq: Four times a day (QID) | ORAL | Status: DC
  Administered 2024-09-08 – 2024-09-09 (×4): 12.5 mg via ORAL
  Filled 2024-09-08 (×4): qty 1

## 2024-09-08 MED ORDER — OXYCODONE HCL 5 MG PO TABS
5.0000 mg | ORAL_TABLET | ORAL | Status: DC | PRN
  Administered 2024-09-09 – 2024-09-11 (×5): 5 mg via ORAL
  Filled 2024-09-08 (×5): qty 1

## 2024-09-08 NOTE — Evaluation (Signed)
 Occupational Therapy Evaluation Patient Details Name: Matthew Liu MRN: [id number] DOB: 12/03/1940 Today's Date: 09/08/2024   History of Present Illness   83 y.o. male presenting after fall in [employer] parking lot. Found to have R pubic rami fx. Plan for non-op management. PMH: CAD, CABG, IPF, OSA, CVA, PD with mild cognitive impairment, L hip fx 02/19/24.     Clinical Impressions PTA, pt living with wife who has caregivers, but pt independent. Upon eval, pt with decreased balance, safety, and strength. Needing up to min A for LB ADL and transfers as well as mod I for toileting this session. Patient will benefit from continued inpatient follow up therapy, <3 hours/day     If plan is discharge home, recommend the following:   A little help with walking and/or transfers;A little help with bathing/dressing/bathroom;Assistance with cooking/housework;Assist for transportation;Help with stairs or ramp for entrance     Functional Status Assessment   Patient has had a recent decline in their functional status and demonstrates the ability to make significant improvements in function in a reasonable and predictable amount of time.     Equipment Recommendations   Other (comment) (defer)     Recommendations for Other Services         Precautions/Restrictions   Precautions Precautions: Fall (per neuro note, cervical fx is healed with no further need for treatment) Restrictions Weight Bearing Restrictions Per Provider Order: Yes RLE Weight Bearing Per Provider Order: Weight bearing as tolerated     Mobility Bed Mobility Overal bed mobility: Needs Assistance Bed Mobility: Supine to Sit, Sit to Supine     Supine to sit: Min assist Sit to supine: Min assist        Transfers Overall transfer level: Needs assistance Equipment used: Rolling walker (2 wheels) Transfers: Sit to/from Stand Sit to Stand: Min assist, Contact guard assist           General transfer  comment: cues for hand placement. Up from EOB with min A, then CGA, up from toilet CGA and chair CGA      Balance Overall balance assessment: Needs assistance Sitting-balance support: No upper extremity supported, Feet supported Sitting balance-Leahy Scale: Good     Standing balance support: Bilateral upper extremity supported, During functional activity Standing balance-Leahy Scale: Poor                             ADL either performed or assessed with clinical judgement   ADL Overall ADL's : Needs assistance/impaired Eating/Feeding: Independent;Sitting   Grooming: Set up;Sitting   Upper Body Bathing: Set up;Sitting   Lower Body Bathing: Contact guard assist;Minimal assistance;Sit to/from stand   Upper Body Dressing : Set up;Sitting   Lower Body Dressing: Contact guard assist;Minimal assistance;Sit to/from stand   Toilet Transfer: Contact guard assist;Minimal assistance;Ambulation;Rolling walker (2 wheels)   Toileting- Clothing Manipulation and Hygiene: Moderate assistance;Sit to/from stand Toileting - Clothing Manipulation Details (indicate cue type and reason): pt initiated but with poor balance, either min A for balance or mod A for OT performing second portion of task     Functional mobility during ADLs: Contact guard assist;Minimal assistance;Rolling walker (2 wheels)       Vision Patient Visual Report: No change from baseline       Perception         Praxis         Pertinent Vitals/Pain Pain Assessment Pain Assessment: Faces Faces Pain Scale: Hurts little more Pain  Location: R pelvis with mobility Pain Descriptors / Indicators: Discomfort, Sore Pain Intervention(s): Limited activity within patient's tolerance, Monitored during session     Extremity/Trunk Assessment Upper Extremity Assessment Upper Extremity Assessment: Generalized weakness   Lower Extremity Assessment Lower Extremity Assessment: Defer to PT evaluation        Communication Communication Communication: Impaired Factors Affecting Communication: Hearing impaired;Other (comment) (requires mildly increased time for processing at times.)   Cognition Arousal: Alert Behavior During Therapy: WFL for tasks assessed/performed Cognition: No apparent impairments             OT - Cognition Comments: oriented, follows one to two step commands, otherwise not formally assessed                 Following commands: Intact       Cueing  General Comments   Cueing Techniques: Verbal cues;Gestural cues      Exercises     Shoulder Instructions      Home Living Family/patient expects to be discharged to:: Private residence Living Arrangements: Spouse/significant other Available Help at Discharge: Family;Available PRN/intermittently Type of Home: House Home Access: Ramped entrance     Home Layout: One level     Bathroom Shower/Tub: Chief Strategy Officer: Standard Bathroom Accessibility: Yes How Accessible: Accessible via walker Home Equipment: Rolling Walker (2 wheels);Cane - single point;Wheelchair - manual;Shower seat;Grab bars - tub/shower;Rollator (4 wheels)   Additional Comments: daughter lives across the street. pt used to be primary caregiver to wife with dementia but now wife has daily caregiver      Prior Functioning/Environment Prior Level of Function : Independent/Modified Independent;Driving             Mobility Comments: RW for longer distances, rollator if in grass walking to daughter's house ADLs Comments: At baseline, pt is Independent with ADLs and IADLs and drives. Pt is the primary caregiver for is wife who has dementia. Pt received rehab services post fall with L hip fx in 02/2024. Just prior to this admission,  pt reports performing ADLs and IADL Independent to Mod I.    OT Problem List: Decreased strength;Decreased activity tolerance;Impaired balance (sitting and/or standing);Decreased safety  awareness;Pain;Decreased knowledge of precautions;Decreased knowledge of use of DME or AE   OT Treatment/Interventions: Self-care/ADL training;Therapeutic exercise;DME and/or AE instruction;Therapeutic activities;Patient/family education;Balance training      OT Goals(Current goals can be found in the care plan section)   Acute Rehab OT Goals Patient Stated Goal: get better OT Goal Formulation: With patient Time For Goal Achievement: 09/22/24 Potential to Achieve Goals: Good   OT Frequency:  Min 2X/week    Co-evaluation              AM-PAC OT 6 Clicks Daily Activity     Outcome Measure Help from another person eating meals?: None Help from another person taking care of personal grooming?: A Little Help from another person toileting, which includes using toliet, bedpan, or urinal?: A Little Help from another person bathing (including washing, rinsing, drying)?: A Little Help from another person to put on and taking off regular upper body clothing?: A Little Help from another person to put on and taking off regular lower body clothing?: A Little 6 Click Score: 19   End of Session Equipment Utilized During Treatment: Gait belt;Rolling walker (2 wheels) Nurse Communication: Mobility status;Other (comment) (RN cleared to see)  Activity Tolerance: Patient tolerated treatment well Patient left: in bed;with call bell/phone within reach;with bed alarm set  OT Visit  Diagnosis: Unsteadiness on feet (R26.81);Muscle weakness (generalized) (M62.81);Pain                Time: 1050-1116 OT Time Calculation (min): 26 min Charges:  OT General Charges $OT Visit: 1 Visit OT Evaluation $OT Eval Low Complexity: 1 Low OT Treatments $Self Care/Home Management : 8-22 mins  Matthew Liu FREDERICK, OTR/L [facility] Acute Rehabilitation Office: [phone]   Matthew Liu 09/08/2024, 2:24 PM

## 2024-09-08 NOTE — Significant Event (Addendum)
 Rapid Response Event Note   Reason for Call :  Tachycardia-140-150s  Initial Focused Assessment:  Pt lying in bed with eyes open, in no visible distress. He denies SOB/CP but does c/o some dizziness. Lungs CTA, Skin warm and dry.   T-99.6, HR-130-150, BP-119/63, RR-20, SpO2-95% on RA  Interventions:  EKG-AF RVR. L axis deviation. LVH with repolarization abn(image under media) 10mg  cardiazem IV x 1 Trop-13 500cc NS bolus Plan of Care:  HR down to 90s after cardizem, still AF. Pt says he feels better. Continue to monitor pt closely. Please call RRT if further assistance needed.   Event Summary:   MD Notified: Dr. Roann notified by bedside RN Call [phone] Arrival Time:1906 End Time:1929   Tish Graeme Piety, RN

## 2024-09-08 NOTE — TOC CAGE-AID Note (Signed)
 Transition of Care [facility]) - CAGE-AID Screening   Patient Details  Name: Matthew Liu MRN: [id number] Date of Birth: 1940/11/13  [facility] CM/SW Contact:    LEBRON ROCKIE ORN, RN Phone Number: [phone] 09/08/2024, 5:23 PM   Clinical Narrative: Pt denies illicit drug use and reports that he does drink occasional alcohol.  Denies hx of WD. Screening complete.   CAGE-AID Screening:    Have You Ever Felt You Ought to Cut Down on Your Drinking or Drug Use?: No Have People Annoyed You By Critizing Your Drinking Or Drug Use?: No Have You Felt Bad Or Guilty About Your Drinking Or Drug Use?: No Have You Ever Had a Drink or Used Drugs First Thing In The Morning to Steady Your Nerves or to Get Rid of a Hangover?: No CAGE-AID Score: 0  Substance Abuse Education Offered: No

## 2024-09-08 NOTE — Progress Notes (Addendum)
       Overnight   NAME: AVERY KLINGBEIL MRN: [id number] DOB : [birth year]    Date of Service   09/08/2024   HPI/Events of Note   HPI: Patient is a 83 year old male with past medical history of CAD status post CABG, IPF, OSA, history of CVA, Parkinson's disease with mild cognitive impairment, and left hip fracture status postsurgical repair on 02/19/2024. patient was admitted due to a fall on 11/30. Pt reports he fell in the [employer] parking lot on 09/08/2024 and sustained a right pubic rami fracture.  Patient also has a history of C7 wedge compression fracture.   Events overnight: 1847 Rn notified Paudel MD of HR sustaining in 140s, rapid response called. MD ordered Cardizem 10 mg IV. And troponin. Report given to this NP.   1932: Post Cardizem Bp had dropped slightly and I ordered a 500 cc bolus, and repeat EKG due to patients HR variability.    Interventions/ Plan   Cardizem EKG  Troponin Appreciated cardiology recommendations          Updates  2038- EKG remains Afib RVR rate between 95-122, paged on call cardiology team.    2155-Spoke with in house cardiology woods MD who recommended to stop the coreg , start PO metoprolol  12.5mg  q6, obtain echo in am, and consult cardiology.   Ordered recommendations per cardiology.   0100-  Vital signs stable.  Most recent blood pressure 120/76 MAP of 90 heart rate 79.    Arayah Krouse Donati- Aram BSN RN CCRN AGACNP-BC Acute Care Nurse Practitioner [facility]

## 2024-09-08 NOTE — Progress Notes (Signed)
  Progress Note   Patient: Matthew Liu FMW:[id number] DOB: [birth year] DOA: 09/06/2024     0 DOS: the patient was seen and examined on 09/08/2024   Brief hospital course: Matthew Liu is a 83 y.o. male with past medical history  of  CAD status post CABG, IPF, OSA, history of CVA, Parkinson disease with mild cognitive impairment, and left hip fracture status postsurgical repair on 02/19/2024, being admitted today for a fall and observation. Patient drove himself to [employer] which she does typically send regulatory oriented and independent at baseline.  After getting his groceries and on his way back he was walking towards the, where he fell over the divider in the parking lot.  No other complaints today of chest pain palpitation dizziness or loss of consciousness.  Assessment and Plan: 83 year old male W/PMH of Parkinson's disease, CAD s/p CABG, IPF, OSA, history of CVA, mild cognitive impairment who was brought in for a fall with old C7 with compression fracture  and acute right pubic rami fracture.   1.  Falls - Patient has a history of parkinsonism and frequent falls. - Fall precaution - Patient does not take any parkinsonian medications   2.  C7 wedge compression fracture, old -Neurosurgery consulted and they advised that this fracture is  old -MRI spine showed no acute fracture -No need for hard collar -Fall precaution   3.  Right pubic rami fracture - Orthopedic consult appreciated - Advised non-operative management  and  WBAT - Pain control with Tylenol  and oxycodone  - PT/OT   4.  PAD/CAD s/p CABG - Clinically stable - Resume Plavix , Coreg    5.  Parkinson's disease with mild cognitive impairment - No parkinsonian medication at this point at home   6.  GERD/history of Barrett's esophagus - Continue IV PPI and aspiration precautions      Subjective: He complains of mild nausea after working with PT  Physical Exam: Vitals:   09/07/24 2020 09/08/24 0353 09/08/24 0819  09/08/24 1301  BP: (!) 122/52 (!) 143/76 119/73 (!) 142/66  Pulse: 79 86 86 78  Resp: 14 16 18 18   Temp: 98.7 F (37.1 C) 98.1 F (36.7 C) 98.6 F (37 C) 98.5 F (36.9 C)  TempSrc: Oral  Oral Oral  SpO2: 95% 95% 95% 97%  Weight:      Height:       Constitutional: Alert, awake, calm, comfortable HEENT: Neck supple Respiratory: clear to auscultation bilaterally, no wheezing, no crackles. Normal respiratory effort. No accessory muscle use.  Cardiovascular: Regular rate and rhythm, no murmurs / rubs / gallops. No extremity edema. 2+ pedal pulses. No carotid bruits.  Abdomen: no tenderness, no masses palpated. No hepatosplenomegaly. Bowel sounds positive.  Musculoskeletal: no clubbing / cyanosis. No joint deformity upper and lower extremities. Good ROM, no contractures. Normal muscle tone.  Skin: no rashes, lesions, ulcers. No induration Neurologic: CN 2-12 grossly intact. Sensation intact, DTR normal. Strength 5/5 x all 4 extremities.  Psychiatric: Normal judgment and insight. Alert and oriented x 3. Normal mood.   Data Reviewed:  Reviewed creatinine is 1.32  Family Communication: None available  Disposition: Status is: Observation The patient remains OBS appropriate and will d/c before 2 midnights.  Planned Discharge Destination: Home with Home Health    Time spent: 35 minutes  Author: Nikaya Nasby, MD 09/08/2024 3:35 PM  For on call review [email or web address].

## 2024-09-08 NOTE — Evaluation (Signed)
 Physical Therapy Evaluation Patient Details Name: Matthew Liu MRN: [id number] DOB: 13-Sep-1941 Today's Date: 09/08/2024  History of Present Illness  83 y.o. male presenting 11/28 after fall in [employer] parking lot. Found to have R pubic rami fx. Plan for non-op management. PMH: CAD, CABG, IPF, OSA, CVA, PD with mild cognitive impairment, L hip fx 02/19/24.  Clinical Impression  Pt presents with condition above and deficits mentioned below, see PT Problem List. PTA, he was mod I for functional mobility, not using an AD in the home but using a RW/rollator for community mobility. He lives with his wife in a 1-level house with a ramped entry. They have a PCA come along with family often throughout the day to assist with caring for his wife, but pt reports he would be unlikely to arrange 24/7 care to assist both him and her. Currently, the pt is displaying limitations in bil leg strength and R hip ROM, limited by R pelvic pain. He is currently requiring modA for bed mobility and minA for transfers and to ambulate with a RW. He is at high risk for falls, displaying deficits in balance, activity tolerance, and gross strength (R knee buckling intermittently). Due to his limited support at d/c and his high risk for falls and functional decline, he could benefit from inpatient rehab, < 3 hours/day. Will continue to follow acutely.        If plan is discharge home, recommend the following: A little help with walking and/or transfers;A little help with bathing/dressing/bathroom;Assistance with cooking/housework;Assist for transportation;Help with stairs or ramp for entrance   Can travel by private vehicle   Yes    Equipment Recommendations BSC/3in1  Recommendations for Other Services       Functional Status Assessment Patient has had a recent decline in their functional status and demonstrates the ability to make significant improvements in function in a reasonable and predictable amount of time.      Precautions / Restrictions Precautions Precautions: Fall;Cervical (11/30 Dr. Roann requesting pt follow cervical precautions, but no need for brace) Precaution Booklet Issued: Yes (comment) Precaution/Restrictions Comments: educated pt on cervical precautions per Dr. Odessa request, but pt is not very compliant Restrictions Weight Bearing Restrictions Per Provider Order: Yes RLE Weight Bearing Per Provider Order: Weight bearing as tolerated      Mobility  Bed Mobility Overal bed mobility: Needs Assistance Bed Mobility: Supine to Sit, Sit to Supine     Supine to sit: Mod assist, HOB elevated, Used rails Sit to supine: Mod assist, HOB elevated, Used rails   General bed mobility comments: Cued pt to log roll then transition sidelying to sit to follow cervical precautions, but pt unable to tolerate rolling due to pelvic pain. Pt instead pivoted on his buttocks using his elbows and hands to prop up his trunk and pivot transitioning supine <> sit R EOB. ModA needed to ascend trunk and scoot hips to EOB. ModA to lift legs and direct trunk back to supine    Transfers Overall transfer level: Needs assistance Equipment used: Rolling walker (2 wheels) Transfers: Sit to/from Stand Sit to Stand: Min assist           General transfer comment: MinA needed to power up to stand from EOB to RW    Ambulation/Gait Ambulation/Gait assistance: Min assist Gait Distance (Feet): 40 Feet Assistive device: Rolling walker (2 wheels) Gait Pattern/deviations: Step-to pattern, Decreased stance time - right, Knees buckling, Decreased stride length, Shuffle, Antalgic, Trunk flexed, Decreased weight shift to  right Gait velocity: reduced Gait velocity interpretation: <1.31 ft/sec, indicative of household ambulator   General Gait Details: Pt ambulates with a slow, antalgic gait pattern with reduced R weight shift and stance time with intermittent R knee buckling, likely due to pain with weight bearing.  MinA needed for balance. Pt did not seem to be very self aware of when to turn around to return to room for his safety, needing cues to do so  Stairs            Wheelchair Mobility     Tilt Bed    Modified Rankin (Stroke Patients Only)       Balance Overall balance assessment: Needs assistance Sitting-balance support: No upper extremity supported, Feet supported Sitting balance-Leahy Scale: Good     Standing balance support: Bilateral upper extremity supported, During functional activity, Reliant on assistive device for balance Standing balance-Leahy Scale: Poor Standing balance comment: reliant on RW and minA                             Pertinent Vitals/Pain Pain Assessment Pain Assessment: Faces Faces Pain Scale: Hurts even more Pain Location: R pelvis with mobility Pain Descriptors / Indicators: Discomfort, Sore, Grimacing, Guarding Pain Intervention(s): Limited activity within patient's tolerance, Monitored during session, Repositioned    Home Living Family/patient expects to be discharged to:: Private residence Living Arrangements: Spouse/significant other Available Help at Discharge: Family;Available PRN/intermittently Type of Home: House Home Access: Ramped entrance       Home Layout: One level Home Equipment: [employer] (2 wheels);Cane - single point;Wheelchair - manual;Shower seat;Grab bars - tub/shower;Rollator (4 wheels) Additional Comments: daughter lives across the street. pt used to be primary caregiver to wife with dementia but now wife has daily caregiver    Prior Function Prior Level of Function : Independent/Modified Independent;Driving             Mobility Comments: RW for longer distances, rollator if in grass walking to daughter's house, no AD inside ADLs Comments: At baseline, pt is Independent with ADLs and IADLs and drives. Pt is the primary caregiver for is wife who has dementia. Pt received rehab services post fall  with L hip fx in 02/2024. Just prior to this admission,  pt reports performing ADLs and IADL Independent to Mod I.     Extremity/Trunk Assessment   Upper Extremity Assessment Upper Extremity Assessment: Defer to OT evaluation    Lower Extremity Assessment Lower Extremity Assessment: RLE deficits/detail;Generalized weakness;LLE deficits/detail RLE Deficits / Details: limited hip ROM due to pain, MMT score of 2+ hip flexion, 4+ knee extension; denied numbness/tingling bil LLE Deficits / Details: MMT scores of 4 hip flexion, 4+ knee extension; denied numbness/tingling bil    Cervical / Trunk Assessment Cervical / Trunk Assessment: Kyphotic  Communication   Communication Communication: Impaired Factors Affecting Communication: Hearing impaired;Other (comment) (requires mildly increased time for processing at times.)    Cognition Arousal: Alert Behavior During Therapy: WFL for tasks assessed/performed   PT - Cognitive impairments: Memory                       PT - Cognition Comments: Pt has difficulty complying to cervical precautions, needing reminders Following commands: Intact       Cueing Cueing Techniques: Verbal cues, Gestural cues, Tactile cues     General Comments      Exercises     Assessment/Plan    PT Assessment  Patient needs continued PT services  PT Problem List Decreased strength;Decreased range of motion;Decreased activity tolerance;Decreased balance;Decreased mobility;Pain       PT Treatment Interventions DME instruction;Gait training;Functional mobility training;Therapeutic activities;Therapeutic exercise;Balance training;Neuromuscular re-education;Patient/family education    PT Goals (Current goals can be found in the Care Plan section)  Acute Rehab PT Goals Patient Stated Goal: to improve PT Goal Formulation: With patient Time For Goal Achievement: 09/22/24 Potential to Achieve Goals: Good    Frequency Min 2X/week     Co-evaluation                AM-PAC PT 6 Clicks Mobility  Outcome Measure Help needed turning from your back to your side while in a flat bed without using bedrails?: A Lot Help needed moving from lying on your back to sitting on the side of a flat bed without using bedrails?: A Lot Help needed moving to and from a bed to a chair (including a wheelchair)?: A Little Help needed standing up from a chair using your arms (e.g., wheelchair or bedside chair)?: A Little Help needed to walk in hospital room?: A Little Help needed climbing 3-5 steps with a railing? : Total 6 Click Score: 14    End of Session Equipment Utilized During Treatment: Gait belt Activity Tolerance: Patient tolerated treatment well Patient left: in bed;with call bell/phone within reach;with bed alarm set   PT Visit Diagnosis: Unsteadiness on feet (R26.81);Other abnormalities of gait and mobility (R26.89);Muscle weakness (generalized) (M62.81);History of falling (Z91.81);Repeated falls (R29.6);Difficulty in walking, not elsewhere classified (R26.2);Pain Pain - Right/Left: Right Pain - part of body:  (pelvis)    Time: 8499-8474 PT Time Calculation (min) (ACUTE ONLY): 25 min   Charges:   PT Evaluation $PT Eval Low Complexity: 1 Low PT Treatments $Therapeutic Activity: 8-22 mins PT General Charges $$ ACUTE PT VISIT: 1 Visit         Theo Ferretti, PT, DPT Acute Rehabilitation Services  Office: [phone]   Theo CHRISTELLA Ferretti 09/08/2024, 4:55 PM

## 2024-09-09 ENCOUNTER — Observation Stay (HOSPITAL_COMMUNITY)

## 2024-09-09 DIAGNOSIS — E8809 Other disorders of plasma-protein metabolism, not elsewhere classified: Secondary | ICD-10-CM | POA: Diagnosis not present

## 2024-09-09 DIAGNOSIS — I4891 Unspecified atrial fibrillation: Secondary | ICD-10-CM | POA: Diagnosis not present

## 2024-09-09 DIAGNOSIS — E7800 Pure hypercholesterolemia, unspecified: Secondary | ICD-10-CM

## 2024-09-09 DIAGNOSIS — I2510 Atherosclerotic heart disease of native coronary artery without angina pectoris: Secondary | ICD-10-CM

## 2024-09-09 DIAGNOSIS — I110 Hypertensive heart disease with heart failure: Secondary | ICD-10-CM | POA: Diagnosis not present

## 2024-09-09 DIAGNOSIS — W19XXXA Unspecified fall, initial encounter: Secondary | ICD-10-CM | POA: Diagnosis not present

## 2024-09-09 DIAGNOSIS — I255 Ischemic cardiomyopathy: Secondary | ICD-10-CM | POA: Diagnosis not present

## 2024-09-09 DIAGNOSIS — E785 Hyperlipidemia, unspecified: Secondary | ICD-10-CM

## 2024-09-09 DIAGNOSIS — I739 Peripheral vascular disease, unspecified: Secondary | ICD-10-CM | POA: Diagnosis not present

## 2024-09-09 DIAGNOSIS — I10 Essential (primary) hypertension: Secondary | ICD-10-CM | POA: Diagnosis not present

## 2024-09-09 DIAGNOSIS — D539 Nutritional anemia, unspecified: Secondary | ICD-10-CM | POA: Diagnosis not present

## 2024-09-09 DIAGNOSIS — I5022 Chronic systolic (congestive) heart failure: Secondary | ICD-10-CM | POA: Diagnosis not present

## 2024-09-09 DIAGNOSIS — M4852XA Collapsed vertebra, not elsewhere classified, cervical region, initial encounter for fracture: Secondary | ICD-10-CM | POA: Diagnosis not present

## 2024-09-09 DIAGNOSIS — G20A1 Parkinson's disease without dyskinesia, without mention of fluctuations: Secondary | ICD-10-CM | POA: Diagnosis not present

## 2024-09-09 DIAGNOSIS — S32591A Other specified fracture of right pubis, initial encounter for closed fracture: Secondary | ICD-10-CM | POA: Diagnosis not present

## 2024-09-09 DIAGNOSIS — D696 Thrombocytopenia, unspecified: Secondary | ICD-10-CM | POA: Diagnosis not present

## 2024-09-09 LAB — CBC WITH DIFFERENTIAL/PLATELET
Abs Immature Granulocytes: 0.02 K/uL (ref 0.00–0.07)
Basophils Absolute: 0 K/uL (ref 0.0–0.1)
Basophils Relative: 0 %
Eosinophils Absolute: 0.3 K/uL (ref 0.0–0.5)
Eosinophils Relative: 6 %
HCT: 27.9 % — ABNORMAL LOW (ref 39.0–52.0)
Hemoglobin: 9.5 g/dL — ABNORMAL LOW (ref 13.0–17.0)
Immature Granulocytes: 0 %
Lymphocytes Relative: 23 %
Lymphs Abs: 1.3 K/uL (ref 0.7–4.0)
MCH: 35.6 pg — ABNORMAL HIGH (ref 26.0–34.0)
MCHC: 34.1 g/dL (ref 30.0–36.0)
MCV: 104.5 fL — ABNORMAL HIGH (ref 80.0–100.0)
Monocytes Absolute: 0.6 K/uL (ref 0.1–1.0)
Monocytes Relative: 10 %
Neutro Abs: 3.4 K/uL (ref 1.7–7.7)
Neutrophils Relative %: 61 %
Platelets: 122 K/uL — ABNORMAL LOW (ref 150–400)
RBC: 2.67 MIL/uL — ABNORMAL LOW (ref 4.22–5.81)
RDW: 14.4 % (ref 11.5–15.5)
WBC: 5.6 K/uL (ref 4.0–10.5)
nRBC: 0 % (ref 0.0–0.2)

## 2024-09-09 LAB — COMPREHENSIVE METABOLIC PANEL WITH GFR
ALT: 23 U/L (ref 0–44)
AST: 30 U/L (ref 15–41)
Albumin: 2.8 g/dL — ABNORMAL LOW (ref 3.5–5.0)
Alkaline Phosphatase: 119 U/L (ref 38–126)
Anion gap: 5 (ref 5–15)
BUN: 33 mg/dL — ABNORMAL HIGH (ref 8–23)
CO2: 28 mmol/L (ref 22–32)
Calcium: 8.2 mg/dL — ABNORMAL LOW (ref 8.9–10.3)
Chloride: 103 mmol/L (ref 98–111)
Creatinine, Ser: 1.15 mg/dL (ref 0.61–1.24)
GFR, Estimated: >60 mL/min (ref >60)
Glucose, Bld: 120 mg/dL — ABNORMAL HIGH (ref 70–99)
Potassium: 4.3 mmol/L (ref 3.5–5.1)
Sodium: 136 mmol/L (ref 135–145)
Total Bilirubin: 1 mg/dL (ref 0.0–1.2)
Total Protein: 5.9 g/dL — ABNORMAL LOW (ref 6.5–8.1)

## 2024-09-09 LAB — ECHOCARDIOGRAM COMPLETE
AR max vel: 2.66 cm2
AV Area VTI: 3.02 cm2
AV Area mean vel: 2.54 cm2
AV Mean grad: 3 mmHg
AV Peak grad: 5.7 mmHg
Ao pk vel: 1.19 m/s
Area-P 1/2: 2.87 cm2
Height: 67 in
S' Lateral: 3.4 cm
Weight: 2239.87 [oz_av]

## 2024-09-09 LAB — MAGNESIUM: Magnesium: 1.9 mg/dL (ref 1.7–2.4)

## 2024-09-09 LAB — PHOSPHORUS: Phosphorus: 3.5 mg/dL (ref 2.5–4.6)

## 2024-09-09 MED ORDER — METOPROLOL SUCCINATE ER 50 MG PO TB24
50.0000 mg | ORAL_TABLET | Freq: Every day | ORAL | Status: DC
  Administered 2024-09-10 – 2024-09-12 (×3): 50 mg via ORAL
  Filled 2024-09-09 (×3): qty 1

## 2024-09-09 MED ORDER — METOPROLOL TARTRATE 12.5 MG HALF TABLET
12.5000 mg | ORAL_TABLET | Freq: Four times a day (QID) | ORAL | Status: AC
  Administered 2024-09-09: 12.5 mg via ORAL
  Filled 2024-09-09: qty 1

## 2024-09-09 MED ORDER — FUROSEMIDE 40 MG PO TABS
40.0000 mg | ORAL_TABLET | Freq: Every day | ORAL | Status: DC
  Administered 2024-09-09 – 2024-09-12 (×4): 40 mg via ORAL
  Filled 2024-09-09 (×4): qty 1

## 2024-09-09 NOTE — Progress Notes (Signed)
 Occupational Therapy Treatment Patient Details Name: Matthew Liu MRN: [id number] DOB: 1941/04/12 Today's Date: 09/09/2024   History of present illness 83 y.o. male presenting 11/28 after fall in [employer] parking lot. Found to have R pubic rami fx. Plan for non-op management. PMH: CAD, CABG, IPF, OSA, CVA, PD with mild cognitive impairment, L hip fx 02/19/24.   OT comments  Pt is progressing well towards OT goals. Focus of session on progressing functional mobility and increasing independence with ADL tasks. Pt required up to Mod A overall for functional transfers and toileting tasks this session. Pt continues to benefit from skilled OT services in acute care to facilitate progress towards functional goals. Continue per POC.       If plan is discharge home, recommend the following:  A little help with walking and/or transfers;A little help with bathing/dressing/bathroom;Assistance with cooking/housework;Assist for transportation;Help with stairs or ramp for entrance   Equipment Recommendations  Other (comment) (defer to next venue)    Recommendations for Other Services      Precautions / Restrictions Precautions Precautions: Fall Precaution Booklet Issued: Yes (comment) Precaution/Restrictions Comments: educated pt on cervical precautions per Dr. Odessa request, but pt is not very compliant Restrictions Weight Bearing Restrictions Per Provider Order: Yes       Mobility Bed Mobility Overal bed mobility: Needs Assistance Bed Mobility: Sit to Supine       Sit to supine: Mod assist   General bed mobility comments: Pt required Mod A to manage BLE onto bed, Pt able to control trunk and repostion hips in bed.    Transfers Overall transfer level: Needs assistance Equipment used: Rolling walker (2 wheels) Transfers: Sit to/from Stand Sit to Stand: Min assist, From elevated surface           General transfer comment: Pt greeted standing with RN and NT. Pt required Min A to  stand from [facility]. Min A during mobility with RW. verbal cues intermittently for sequencing navigation     Balance Overall balance assessment: Needs assistance Sitting-balance support: Single extremity supported, Feet supported Sitting balance-Leahy Scale: Good Sitting balance - Comments: Pt able to maintain sitting balance on BSC   Standing balance support: Bilateral upper extremity supported, During functional activity, Reliant on assistive device for balance Standing balance-Leahy Scale: Poor Standing balance comment: Dependent on RW and external support                           ADL either performed or assessed with clinical judgement   ADL Overall ADL's : Needs assistance/impaired                         Toilet Transfer: Minimal assistance;Ambulation;BSC/3in1;Rolling walker (2 wheels) Toilet Transfer Details (indicate cue type and reason): BSC over toilet in bathroom. Verbal cues for proper hand placement on RW and BSC, as well as safe body placement prior to sitting. Toileting- Clothing Manipulation and Hygiene: Moderate assistance;Sit to/from stand Toileting - Clothing Manipulation Details (indicate cue type and reason): Pt initiated posterior peri care, required Mod A to maintain standing balance and transitioned to OT completing toielting hygeine to ensure cleanliness and allow Pt to utilize BUE to stabilize in standing.            Extremity/Trunk Assessment Upper Extremity Assessment Upper Extremity Assessment: Generalized weakness   Lower Extremity Assessment Lower Extremity Assessment: Defer to PT evaluation        Vision  Perception     Praxis     Communication Communication Communication: Impaired Factors Affecting Communication: Hearing impaired;Other (comment) (requires mildly increased time for processing at times.)   Cognition Arousal: Alert Behavior During Therapy: WFL for tasks assessed/performed Cognition: No apparent  impairments             OT - Cognition Comments: Pt followed one step commands with increased time                 Following commands: Intact        Cueing   Cueing Techniques: Verbal cues, Gestural cues  Exercises      Shoulder Instructions       General Comments VSS on RA. Pt educated on importance of not holding breath during functional tasks. Encouraged PLB.    Pertinent Vitals/ Pain       Pain Assessment Pain Assessment: Faces Faces Pain Scale: Hurts even more Pain Location: R side of pelvis with mobility Pain Descriptors / Indicators: Discomfort, Grimacing, Guarding, Moaning Pain Intervention(s): Limited activity within patient's tolerance, Monitored during session, Repositioned  Home Living                                          Prior Functioning/Environment              Frequency  Min 2X/week        Progress Toward Goals  OT Goals(current goals can now be found in the care plan section)  Progress towards OT goals: Progressing toward goals  Acute Rehab OT Goals Patient Stated Goal: to use the bathroom OT Goal Formulation: With patient Time For Goal Achievement: 09/22/24 Potential to Achieve Goals: Good ADL Goals Pt Will Perform Grooming: with supervision;standing Pt Will Perform Lower Body Dressing: with supervision;sit to/from stand Pt Will Transfer to Toilet: with supervision;ambulating  Plan      Co-evaluation                 AM-PAC OT 6 Clicks Daily Activity     Outcome Measure   Help from another person eating meals?: None Help from another person taking care of personal grooming?: A Little Help from another person toileting, which includes using toliet, bedpan, or urinal?: A Lot Help from another person bathing (including washing, rinsing, drying)?: A Little Help from another person to put on and taking off regular upper body clothing?: A Little Help from another person to put on and taking off  regular lower body clothing?: A Little 6 Click Score: 18    End of Session Equipment Utilized During Treatment: Rolling walker (2 wheels)  OT Visit Diagnosis: Unsteadiness on feet (R26.81);Muscle weakness (generalized) (M62.81);Pain   Activity Tolerance Patient tolerated treatment well   Patient Left in bed;with call bell/phone within reach;with bed alarm set   Nurse Communication Mobility status        Time: 8498-8482 OT Time Calculation (min): 16 min  Charges: OT General Charges $OT Visit: 1 Visit OT Treatments $Self Care/Home Management : 8-22 mins  Maurilio CROME, OTR/L.  [facility] Acute Rehabilitation  Office: [phone]   Maurilio PARAS Coye Dawood 09/09/2024, 4:02 PM

## 2024-09-09 NOTE — Progress Notes (Signed)
 Physical Therapy Treatment Patient Details Name: Matthew Liu MRN: [id number] DOB: 16-Jun-1941 Today's Date: 09/09/2024   History of Present Illness 83 y.o. male presenting 11/28 after fall in [employer] parking lot. Found to have R pubic rami fx. Plan for non-op management. PMH: CAD, CABG, IPF, OSA, CVA, PD with mild cognitive impairment, L hip fx 02/19/24.    PT Comments  Pt progressing well towards all goals. Due to R hip pain pt continues to require modA for bed mobility however did demo improved ambulation tolerance. Pt with delayed response time but able to follow all commands. Pt to continue to benefit from inpatient rehab program < 3 hrs a day to address above deficits to achieve safe mod I level of function for safe transition home. Acute PT to cont to follow.    If plan is discharge home, recommend the following: A little help with walking and/or transfers;A little help with bathing/dressing/bathroom;Assistance with cooking/housework;Assist for transportation;Help with stairs or ramp for entrance   Can travel by private vehicle     Yes  Equipment Recommendations  BSC/3in1    Recommendations for Other Services       Precautions / Restrictions Precautions Precautions: Fall Restrictions Weight Bearing Restrictions Per Provider Order: Yes RLE Weight Bearing Per Provider Order: Weight bearing as tolerated     Mobility  Bed Mobility Overal bed mobility: Needs Assistance Bed Mobility: Supine to Sit, Sit to Supine     Supine to sit: Mod assist, HOB elevated, Used rails     General bed mobility comments: HOB elevated, modA for LE management and trunk elevation, increased time, helicopter transfer    Transfers Overall transfer level: Needs assistance Equipment used: Rolling walker (2 wheels) Transfers: Sit to/from Stand Sit to Stand: Min assist           General transfer comment: MinA needed to power up to stand from EOB to RW    Ambulation/Gait Ambulation/Gait  assistance: Min assist Gait Distance (Feet): 50 Feet Assistive device: Rolling walker (2 wheels) Gait Pattern/deviations: Step-to pattern, Decreased stance time - right, Knees buckling, Decreased stride length, Shuffle, Antalgic, Trunk flexed, Decreased weight shift to right Gait velocity: reduced Gait velocity interpretation: <1.31 ft/sec, indicative of household ambulator   General Gait Details: pt with progressive step length from beginning to end of ambulation, pt with increased bilat UE dependence during stance phase on R LE   Stairs             Wheelchair Mobility     Tilt Bed    Modified Rankin (Stroke Patients Only)       Balance Overall balance assessment: Needs assistance Sitting-balance support: No upper extremity supported, Feet supported Sitting balance-Leahy Scale: Good     Standing balance support: Bilateral upper extremity supported, During functional activity, Reliant on assistive device for balance Standing balance-Leahy Scale: Poor Standing balance comment: reliant on RW and minA                            Communication Communication Communication: Impaired Factors Affecting Communication: Hearing impaired;Other (comment) (requires mildly increased time for processing at times.)  Cognition Arousal: Alert Behavior During Therapy: WFL for tasks assessed/performed   PT - Cognitive impairments: Memory                       PT - Cognition Comments: pt with delayed response time however RN reports giving pt 5mg  of oxy and  started talking to people not in the room Following commands: Intact      Cueing Cueing Techniques: Verbal cues, Gestural cues, Tactile cues  Exercises General Exercises - Lower Extremity Ankle Circles/Pumps: AROM, Both, 10 reps, Supine Quad Sets: AROM, Right, 10 reps, Supine Gluteal Sets: AROM, Both, 10 reps, Supine Long Arc Quad: AROM, Right, 10 reps, Seated    General Comments General comments (skin  integrity, edema, etc.): SpO2 > 94% on RA      Pertinent Vitals/Pain Pain Assessment Pain Assessment: Faces Faces Pain Scale: Hurts even more Pain Location: R pelvis with mobility Pain Descriptors / Indicators: Discomfort, Sore, Grimacing, Guarding    Home Living                          Prior Function            PT Goals (current goals can now be found in the care plan section) Acute Rehab PT Goals Patient Stated Goal: to improve PT Goal Formulation: With patient Time For Goal Achievement: 09/22/24 Potential to Achieve Goals: Good Progress towards PT goals: Progressing toward goals    Frequency    Min 2X/week      PT Plan      Co-evaluation              AM-PAC PT 6 Clicks Mobility   Outcome Measure  Help needed turning from your back to your side while in a flat bed without using bedrails?: A Lot Help needed moving from lying on your back to sitting on the side of a flat bed without using bedrails?: A Lot Help needed moving to and from a bed to a chair (including a wheelchair)?: A Little Help needed standing up from a chair using your arms (e.g., wheelchair or bedside chair)?: A Little Help needed to walk in hospital room?: A Little Help needed climbing 3-5 steps with a railing? : Total 6 Click Score: 14    End of Session Equipment Utilized During Treatment: Gait belt Activity Tolerance: Patient tolerated treatment well Patient left: with call bell/phone within reach;in chair;with chair alarm set Nurse Communication: Mobility status PT Visit Diagnosis: Unsteadiness on feet (R26.81);Other abnormalities of gait and mobility (R26.89);Muscle weakness (generalized) (M62.81);History of falling (Z91.81);Repeated falls (R29.6);Difficulty in walking, not elsewhere classified (R26.2);Pain Pain - Right/Left: Right Pain - part of body:  (pelvis)     Time: 8877-8846 PT Time Calculation (min) (ACUTE ONLY): 31 min  Charges:    $Gait Training: 8-22  mins $Therapeutic Exercise: 8-22 mins PT General Charges $$ ACUTE PT VISIT: 1 Visit                     Matthew Liu, PT, DPT Acute Rehabilitation Services Secure chat preferred Office #: [phone]    Matthew Liu 09/09/2024, 2:17 PM

## 2024-09-09 NOTE — TOC Initial Note (Signed)
 Transition of Care [facility]) - Initial/Assessment Note    Patient Details  Name: Matthew Liu MRN: [id number] Date of Birth: [birth year]  Transition of Care [facility]) CM/SW Contact:    Hoit Jon Bloch, RN Phone Number: 09/09/2024, 10:14 AM  Clinical Narrative:       S/P fall, suffered R pubic rami fx ( non operative)        RNCM received consult for possible SNF placement at time of discharge. RNCM spoke with patient regarding PT recommendation of SNF placement at time of discharge. Patient reported that patient's spouse with dementia and daughter that works during the day are unable to care for patient at their home given patient's current physical needs and fall risk. Patient expressed understanding of PT recommendation and is agreeable to SNF placement at time of discharge. Patient reports preference for [facility] ([facility] with [facility]). RNCM discussed insurance authorization process and provided Medicare SNF ratings list. Patient expressed being hopeful for rehab and to feel better soon. No further questions reported at this time. RNCM to continue to follow and assist with discharge planning needs.   Expected Discharge Plan: Skilled Nursing Facility Barriers to Discharge: Continued Medical Work up   Patient Goals and CMS Choice Patient states their goals for this hospitalization and ongoing recovery are:: to get better and go home CMS Medicare.gov Compare Post Acute Care list provided to:: Patient        Expected Discharge Plan and Services   Discharge Planning Services: CM Consult   Living arrangements for the past 2 months: Single Family Home                                      Prior Living Arrangements/Services Living arrangements for the past 2 months: Single Family Home Lives with:: Spouse Patient language and need for interpreter reviewed:: Yes Do you feel safe going back to the place where you live?: Yes      Need for Family Participation in Patient Care: Yes  (Comment) Care giver support system in place?: No (comment)   Criminal Activity/Legal Involvement Pertinent to Current Situation/Hospitalization: No - Comment as needed  Activities of Daily Living   ADL Screening (condition at time of admission) Independently performs ADLs?: Yes (appropriate for developmental age) Is the patient deaf or have difficulty hearing?: Yes Does the patient have difficulty seeing, even when wearing glasses/contacts?: No Does the patient have difficulty concentrating, remembering, or making decisions?: Yes  Permission Sought/Granted   Permission granted to share information with : Yes, Verbal Permission Granted  Share Information with NAME: [name] Goldberg  Daughter  [phone]  [phone]           Emotional Assessment Appearance:: Appears stated age Attitude/Demeanor/Rapport: Engaged Affect (typically observed): Accepting Orientation: : Oriented to Self, Oriented to Place, Oriented to  Time, Oriented to Situation Alcohol / Substance Use: Not Applicable Psych Involvement: No (comment)  Admission diagnosis:  Falls, initial encounter [T80.XXXA] Closed fracture of right pubis, unspecified portion of pubis, initial encounter [facility]) [S32.501A] Patient Active Problem List   Diagnosis Date Noted   Falls, initial encounter 09/06/2024   Closed fracture of right femur, unspecified fracture morphology, initial encounter (HCC) 03/23/2024   Macrocytic anemia 03/23/2024   Fall 02/22/2024   Closed fracture of left hip (HCC) 02/21/2024   Closed left subtrochanteric femur fracture (HCC) 02/18/2024   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/2023   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  Generalized weakness 03/24/2022   Fatigue 03/24/2022   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   Parkinson's disease (HCC) 07/27/2021   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   Excessive daytime sleepiness 06/04/2021   Tremors of nervous system  03/18/2021   Memory loss 03/18/2021   OSA (obstructive sleep apnea) 11/30/2020   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 [place] [address] Phone: [phone] Fax: [phone]  [place] [facility] [address] [place] [address] Phone: [phone] Fax: [phone]  [facility] [address] [place] [address] Phone: [phone] Fax: [phone]     Social Drivers of Health (SDOH) Social History: SDOH Screenings   Food Insecurity: No Food Insecurity (09/06/2024)  Housing: Low Risk  (09/06/2024)  Transportation Needs: No Transportation Needs (09/06/2024)  Utilities: Not At Risk (09/06/2024)  Social Connections: Moderately Integrated (09/06/2024)  Tobacco Use: Low Risk  (03/24/2024)   SDOH Interventions:     Readmission Risk Interventions     No data to display

## 2024-09-09 NOTE — NC FL2 (Signed)
 [place]  MEDICAID FL2 LEVEL OF CARE FORM     IDENTIFICATION  Patient Name: Matthew Liu Birthdate: 1940/12/31 Sex: male Admission Date (Current Location): 09/06/2024  [facility] and [place] Number:  Producer, Television/film/video and Address:         Provider Number: [id number]  Attending Physician Name and Address:  Sherrill Alejandro Donovan, DO  Relative Name and Phone Number:       Current Level of Care: Hospital Recommended Level of Care: Skilled Nursing Facility Prior Approval Number:    Date Approved/Denied:   PASRR Number:    Discharge Plan: SNF    Current Diagnoses: Patient Active Problem List   Diagnosis Date Noted   Falls, initial encounter 09/06/2024   Closed fracture of right femur, unspecified fracture morphology, initial encounter (HCC) 03/23/2024   Macrocytic anemia 03/23/2024   Fall 02/22/2024   Closed fracture of left hip (HCC) 02/21/2024   Closed left subtrochanteric femur fracture (HCC) 02/18/2024   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/2023   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   Generalized weakness 03/24/2022   Fatigue 03/24/2022   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   Parkinson's disease (HCC) 07/27/2021   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   Excessive daytime sleepiness 06/04/2021   Tremors of nervous system 03/18/2021   Memory loss 03/18/2021   OSA (obstructive sleep apnea) 11/30/2020   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   CAD (coronary artery disease) 12/11/2018   Lumbar compression fracture (HCC) 01/09/2018   PAD (peripheral artery disease) 10/13/2015   History of CVA (cerebrovascular accident) 09/15/2015   Erectile dysfunction 12/01/2014   Aspirin  allergy 07/16/2013   Dyslipidemia 07/16/2013   Essential hypertension 07/13/2013   Gastroesophageal reflux disease 07/13/2013   Barrett's esophagus 07/13/2013    Orientation RESPIRATION BLADDER Height & Weight     Self, Time, Situation, Place  O2 ([place]  @ 2L/min) Continent Weight: 63.5 kg Height:  5' 7 (170.2 cm)  BEHAVIORAL SYMPTOMS/MOOD NEUROLOGICAL BOWEL NUTRITION STATUS      Continent Diet (refer to d/c summary)  AMBULATORY STATUS COMMUNICATION OF NEEDS Skin   Extensive Assist Verbally Normal                       Personal Care Assistance Level of Assistance  Bathing, Feeding, Dressing Bathing Assistance: Maximum assistance Feeding assistance: Independent Dressing Assistance: Maximum assistance     Functional Limitations Info  Sight, Hearing, Speech Sight Info: Adequate Hearing Info: Adequate Speech Info: Adequate    SPECIAL CARE FACTORS FREQUENCY  PT (By licensed PT), OT (By licensed OT)     PT Frequency: 5x/week, evaluate and treat OT Frequency: 5x/week, evaluate and treat            Contractures Contractures Info: Not present    Additional Factors Info  Code Status, Allergies Code Status Info: Full Code Allergies Info: Aspirin , Sulfa Antibiotics, Quinine Derivatives           Current Medications (09/09/2024):  This is the current hospital active medication list Current Facility-Administered Medications  Medication Dose Route Frequency Provider Last Rate Last Admin   acetaminophen  (TYLENOL ) tablet 650 mg  650 mg Oral Q6H PRN Patel, Ekta V, MD   650 mg at 09/07/24 2201   Or   acetaminophen  (TYLENOL ) suppository 650 mg  650 mg Rectal Q6H PRN Patel, Ekta V, MD       atorvastatin  (LIPITOR ) tablet 80 mg  80 mg Oral QPM Tobie Mario GAILS, MD  80 mg at 09/08/24 1731   clopidogrel  (PLAVIX ) tablet 75 mg  75 mg Oral Daily Patel, Ekta V, MD   75 mg at 09/09/24 0827   feeding supplement (ENSURE PLUS HIGH PROTEIN) liquid 237 mL  237 mL Oral BID BM Patel, Ekta V, MD   237 mL at 09/09/24 0826   heparin  injection 5,000 Units  5,000 Units Subcutaneous Q12H Patel, Ekta V, MD   5,000 Units at 09/09/24 9173   hydrALAZINE  (APRESOLINE ) injection 10 mg  10 mg Intravenous Q6H PRN Patel, Ekta V, MD       metoprolol  tartrate  (LOPRESSOR ) tablet 12.5 mg  12.5 mg Oral Q6H Donati-Garmon, Natalie M, NP   12.5 mg at 09/09/24 1004   ondansetron  (ZOFRAN ) injection 4 mg  4 mg Intravenous Q6H PRN Paudel, Keshab, MD   4 mg at 09/08/24 1326   oxyCODONE  (Oxy IR/ROXICODONE ) immediate release tablet 5 mg  5 mg Oral Q4H PRN Paudel, Keshab, MD   5 mg at 09/09/24 9072   pantoprazole  (PROTONIX ) EC tablet 40 mg  40 mg Oral Daily Paudel, Nena, MD   40 mg at 09/09/24 0827   sodium chloride  flush (NS) 0.9 % injection 3 mL  3 mL Intravenous Q12H Tobie Mario GAILS, MD   3 mL at 09/08/24 2011     Discharge Medications: Please see discharge summary for a list of discharge medications.  Relevant Imaging Results:  Relevant Lab Results:   Additional Information SSN: [id number]  Rosalva Jon Bloch, RN

## 2024-09-09 NOTE — Progress Notes (Signed)
 PROGRESS NOTE    Matthew Liu  FMW:[id number] DOB: 1941/04/15 DOA: 09/06/2024 PCP: Keren Vicenta BRAVO, MD   Brief Narrative:  Matthew Liu is a 83 y.o. male with past medical history of CAD status post CABG, IPF, OSA, history of CVA, Parkinson disease with mild cognitive impairment, and left hip fracture status postsurgical repair on 02/19/2024, who was admitted after fall.  Patient drove himself to [employer] and after getting his groceries and on his way back he was walking towards the, where he fell over the divider in the parking lot. He did not lose consciousness. Futher workup revealed a C7 Wedge compression fracture, Right sided Pubic rami fracture, and New onset A Fib with RVR. Cardiology consulted for further evaluation and PT/OT recommending SNF.  Assessment and Plan:  Falls: Patient has a history of parkinsonism and frequent falls. C/w Fall precautions. Patient does not take any parkinsonian medications. PT/OT recommending SNF   C7 wedge compression fracture, old: Neurosurgery consulted and they advised that this fracture is old. 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 No acute fracture within the cervical spine. The MRI also showed  Multilevel cervical spondylosis with resultant mild to moderate spinal stenosis at C4-5 and C5-6 as well as  Multifactorial degenerative changes with resultant multilevel foraminal narrowing as above. Notable findings include moderate left C4 foraminal stenosis, severe right C5 foraminal narrowing, with severe right and moderate left C6 foraminal stenosis. -MRI also showed Reactive marrow edema about the left C4-5 facet due to facet arthritis. Finding could serve as a source for neck pain. -No need for hard collar -C/w Fall Precautions   Right Pubic Rami Fracture: Orthopedic consult appreciated and  Advised non-operative management and WBAT; C/w Pain  control with Acetaminophen  po/RC q6hprn Mild Pain and Oxycodone  5 mg po q4hprn Moderate and Severe Pain -PT/OT recommending SNF  New Onset A Fib with RVR s/p Conversion to NSR: CHA2DS2-VASc Score of 7; Checking TSH. On Metoprolol  Tartrate 12.5 mg q6h. CTM on Telemetry. Cardiology planning on stopping current meds and switching to Metoprolol  Succinate 50 mg po Daily and starting AC w/ Rivaroxaban 15 mg po Daily. ECHO as below.  Cardiology is restarting his home furosemide  40 mg p.o. daily  Chronic Systolic CHF (HFmrEF): Checking BNP in the AM. Patient being started on Metoprolol  Succinate 50 mg po Daily. Strict I's and O's and Daily Weights. ECHo done and showed EF of 45-50% w/ global hypokinesis and indeterminate LVDP.  Cardiology also commending considering starting Spironolactone 12.5 mg p.o. daily   PAD/CAD s/p DES and CABG: Clinically stable. Resumed Clopidogrel  but Cardiology planning likely stopping as patient is going to be considered for DOAC transiently. Carvedilol  was changed to Metoprolol  Tartrate 12.5 mg po q6h and Cardiology planning on changing to Metoprolol  Succinate 50 mg po Daily   HLD: Cardiology checking Lipid Panel in the AM. C/w Atorvastatin  80 mg po Daily.   Essential HTN: PTA was on Carvedilol  3.125 mg po BID and Losartan  25 mg po Daily. Cardiology planning on changing meds as above. Currently on Metoprolol  Tartrate 12.5 mg po q6h but likely to be on Metoprolol  Succinate 50 mg po Daily once Cardiology decides. CTM BP per Protocol. Last BP reading was 135/52  Macrocytic Anemia: Hgb/Hct Trend:  Recent Labs  Lab 09/06/24 1351 09/06/24 1351 09/06/24 1530 09/07/24 0138 09/08/24 0251 09/09/24 1041  HGB 12.2*  --  10.9* 9.6* 9.3* 9.5*  HCT 36.0*  --  32.4* 28.2* 27.6* 27.9*  MCV  --    < > 103.5* 102.5* 103.4* 104.5*   < > = values in this interval not displayed.  -Check Anemia Panel. CTM for S/Sx of Bleeding; No overt bleeding noted. Repeat CBC in the  AM  Thrombocytopenia: Plt Count Trend:  Recent Labs  Lab 09/06/24 1530 09/07/24 0138 09/08/24 0251 09/09/24 1041  PLT 154 130* 109* 122*  -CTM for S/Sx of Bleeding; no overt bleeding noted. Repeat CBC in the AM   Parkinson's Disease with mild cognitive impairment: Not taking Parkinsonian medications at this point at home   GERD/History of Barrett's esophagus/GI Prophylaxis: IV PPI changed to po Pantoprazole  40 mg po Daily. C/w Aspiration Precautions  OSA: C/w CPAP qHS  Hypoalbuminemia: Patient's Albumin  Lvl went from 3.6 -> 2.9 -> 2.8 x2. CTM & Trend & repeat CMP in the AM   DVT prophylaxis: heparin  injection 5,000 Units Start: 09/06/24 2200 SCDs Start: 09/06/24 1846    Code Status: Full Code Family Communication: No family present @ bedside  Disposition Plan:  Level of care: Telemetry Status is: Inpatient Remains inpatient appropriate because: Needs further clinical improvement and clearance by Cardiology   Consultants:  Cardiology  Procedures:  ECHOCardiogram  Antimicrobials:  Anti-infectives (From admission, onward)    None       Subjective: Seen and examined at bedside and he was sitting in the chair and felt okay.  Complains of mild pain.  No nausea or vomiting.  States he had a rough night last night and states that he had never had that before previously.  No nausea or vomiting.  Denies lightheadedness or dizziness.  No other concerns or complaints this time.  Objective: Vitals:   09/09/24 0640 09/09/24 1004 09/09/24 1541 09/09/24 1723  BP: (!) 123/54 (!) 132/52 (!) 135/52 114/83  Pulse: 97 82 77 80  Resp: (!) 22 15 16    Temp: 97.9 F (36.6 C) 97.7 F (36.5 C) 98.3 F (36.8 C)   TempSrc: Oral Oral    SpO2: 95% 100% 100% 99%  Weight:      Height:        Intake/Output Summary (Last 24 hours) at 09/09/2024 1820 Last data filed at 09/09/2024 0600 Gross per 24 hour  Intake --  Output 625 ml  Net -625 ml   Filed Weights   09/06/24 1330 09/07/24  0500  Weight: 63.5 kg 63.5 kg   Examination: Physical Exam:  Constitutional: Elderly chronically ill-appearing Caucasian male no acute distress Respiratory: Diminished to auscultation bilaterally, no wheezing, rales, rhonchi or crackles. Normal respiratory effort and patient is not tachypenic. No accessory muscle use.  Unlabored breathing Cardiovascular: RRR, no murmurs / rubs / gallops. S1 and S2 auscultated. No extremity edema.  Abdomen: Soft, non-tender, nondistended secondary to body habitus.  Bowel sounds positive.  GU: Deferred. Musculoskeletal: No clubbing / cyanosis of digits/nails. No joint deformity upper and lower extremities. Skin: No rashes, lesions, ulcers on limited skin evaluation. No induration; Warm and dry.  Neurologic: CN 2-12 grossly intact with no focal deficits. Romberg sign and cerebellar reflexes not assessed.  Psychiatric: Is awake and alert and appears calm  Data Reviewed: I have personally reviewed following labs and imaging studies  CBC: Recent Labs  Lab 09/06/24 1351 09/06/24 1530 09/07/24 0138 09/08/24 0251 09/09/24 1041  WBC  --  8.0 5.0 6.3 5.6  NEUTROABS  --  6.1  --   --  3.4  HGB 12.2* 10.9* 9.6*  9.3* 9.5*  HCT 36.0* 32.4* 28.2* 27.6* 27.9*  MCV  --  103.5* 102.5* 103.4* 104.5*  PLT  --  154 130* 109* 122*   Basic Metabolic Panel: Recent Labs  Lab 09/06/24 1351 09/06/24 1530 09/07/24 0138 09/08/24 0251 09/09/24 1041  NA 141 139 134* 134* 136  K 3.9 3.9 3.4* 3.7 4.3  CL 102 105 103 104 103  CO2  --  25 24 24 28   GLUCOSE 101* 118* 187* 128* 120*  BUN 27* 24* 24* 33* 33*  CREATININE 1.30* 1.08 1.11 1.32* 1.15  CALCIUM   --  8.4* 7.8* 8.1* 8.2*  MG  --   --   --  1.8 1.9  PHOS  --   --   --   --  3.5   GFR: Estimated Creatinine Clearance: 42.9 mL/min (by C-G formula based on SCr of 1.15 mg/dL). Liver Function Tests: Recent Labs  Lab 09/06/24 1530 09/07/24 0138 09/08/24 0251 09/09/24 1041  AST 27 23 24 30   ALT 21 16 16 23    ALKPHOS 162* 120 123 119  BILITOT 0.9 0.9 1.2 1.0  PROT 6.5 5.7* 5.3* 5.9*  ALBUMIN  3.6 2.9* 2.8* 2.8*   No results for input(s): LIPASE, AMYLASE in the last 168 hours. No results for input(s): AMMONIA in the last 168 hours. Coagulation Profile: No results for input(s): INR, PROTIME in the last 168 hours. Cardiac Enzymes: No results for input(s): CKTOTAL, CKMB, CKMBINDEX, TROPONINI in the last 168 hours. BNP (last 3 results) No results for input(s): PROBNP in the last 8760 hours. HbA1C: No results for input(s): HGBA1C in the last 72 hours. CBG: No results for input(s): GLUCAP in the last 168 hours. Lipid Profile: No results for input(s): CHOL, HDL, LDLCALC, TRIG, CHOLHDL, LDLDIRECT in the last 72 hours. Thyroid  Function Tests: No results for input(s): TSH, T4TOTAL, FREET4, T3FREE, THYROIDAB in the last 72 hours. Anemia Panel: No results for input(s): VITAMINB12, FOLATE, FERRITIN, TIBC, IRON, RETICCTPCT in the last 72 hours. Sepsis Labs: Recent Labs  Lab 09/06/24 1352  LATICACIDVEN 1.2    Recent Results (from the past 240 hours)  Surgical PCR screen     Status: None   Collection Time: 09/06/24 11:22 PM   Specimen: Nasal Mucosa; Nasal Swab  Result Value Ref Range Status   MRSA, PCR NEGATIVE NEGATIVE Final   Staphylococcus aureus NEGATIVE NEGATIVE Final    Comment: (NOTE) The Xpert SA Assay (FDA approved for NASAL specimens in patients 30 years of age and older), is one component of a comprehensive surveillance program. It is not intended to diagnose infection nor to guide or monitor treatment. Performed at [facility], [address] [place], [place] [address]     Radiology Studies: ECHOCARDIOGRAM COMPLETE Result Date: 09/09/2024    ECHOCARDIOGRAM REPORT   Patient Name:   Matthew Liu Date of Exam: 09/09/2024 Medical Rec #:  [id number]     Height:       67.0 in Accession #:    [id number]    Weight:        140.0 lb Date of Birth:  19-Apr-1941     BSA:          1.738 m Patient Age:    83 years      BP:           123/54 mmHg Patient Gender: M             HR:  78 bpm. Exam Location:  Inpatient Procedure: 2D Echo, Cardiac Doppler and Color Doppler (Both Spectral and Color            Flow Doppler were utilized during procedure). Indications:    Atrial Fibrillation I48.91  History:        Patient has prior history of Echocardiogram examinations, most                 recent 12/11/2018. CAD and Previous Myocardial Infarction; Risk                 Factors:Hypertension and Sleep Apnea.  Sonographer:    Jayson Gaskins Referring Phys: [id number] NATALIE M DONATI-GARMON IMPRESSIONS  1. Left ventricular ejection fraction, by estimation, is 45 to 50%. The left ventricle has mildly decreased function. The left ventricle demonstrates global hypokinesis. There is mild left ventricular hypertrophy. Left ventricular diastolic parameters are indeterminate.  2. Right ventricular systolic function is normal. The right ventricular size is normal.  3. Left atrial size was mildly dilated.  4. The mitral valve is grossly normal. Trivial mitral valve regurgitation. No evidence of mitral stenosis.  5. The aortic valve is grossly normal. There is moderate calcification of the aortic valve. There is mild thickening of the aortic valve. Aortic valve regurgitation is mild. Aortic valve sclerosis/calcification is present, without any evidence of aortic  stenosis.  6. The inferior vena cava is normal in size with <50% respiratory variability, suggesting right atrial pressure of 8 mmHg. Comparison(s): Prior images unable to be directly viewed, comparison made by report only. Most recent echo 01/19/24 from [facility] reviewed: EF 40-45%, global hypokinesis. FINDINGS  Left Ventricle: Left ventricular ejection fraction, by estimation, is 45 to 50%. The left ventricle has mildly decreased function. The left ventricle demonstrates global hypokinesis.  The left ventricular internal cavity size was normal in size. There is  mild left ventricular hypertrophy. Abnormal (paradoxical) septal motion consistent with post-operative status. Left ventricular diastolic parameters are indeterminate. Right Ventricle: The right ventricular size is normal. No increase in right ventricular wall thickness. Right ventricular systolic function is normal. Left Atrium: Left atrial size was mildly dilated. Right Atrium: Right atrial size was normal in size. Pericardium: There is no evidence of pericardial effusion. Mitral Valve: The mitral valve is grossly normal. Trivial mitral valve regurgitation. No evidence of mitral valve stenosis. Tricuspid Valve: The tricuspid valve is normal in structure. Tricuspid valve regurgitation is mild . No evidence of tricuspid stenosis. Aortic Valve: The aortic valve is grossly normal. There is moderate calcification of the aortic valve. There is mild thickening of the aortic valve. Aortic valve regurgitation is mild. Aortic valve sclerosis/calcification is present, without any evidence  of aortic stenosis. Aortic valve mean gradient measures 3.0 mmHg. Aortic valve peak gradient measures 5.7 mmHg. Aortic valve area, by VTI measures 3.02 cm. Pulmonic Valve: The pulmonic valve was grossly normal. Pulmonic valve regurgitation is mild. No evidence of pulmonic stenosis. Aorta: The aortic root is normal in size and structure. Venous: The inferior vena cava is normal in size with less than 50% respiratory variability, suggesting right atrial pressure of 8 mmHg. IAS/Shunts: No atrial level shunt detected by color flow Doppler.  LEFT VENTRICLE PLAX 2D LVIDd:         4.05 cm   Diastology LVIDs:         3.40 cm   LV e' medial:    5.33 cm/s LV PW:         1.20 cm  LV E/e' medial:  12.7 LV IVS:        1.20 cm   LV e' lateral:   13.40 cm/s LVOT diam:     1.96 cm   LV E/e' lateral: 5.0 LV SV:         71 LV SV Index:   41 LVOT Area:     3.02 cm  RIGHT VENTRICLE  TAPSE (M-mode): 2.0 cm LEFT ATRIUM           Index        RIGHT ATRIUM           Index LA Vol (A4C): 52.3 ml 30.10 ml/m  RA Area:     13.40 cm                                    RA Volume:   29.00 ml  16.69 ml/m  AORTIC VALVE AV Area (Vmax):    2.66 cm AV Area (Vmean):   2.54 cm AV Area (VTI):     3.02 cm AV Vmax:           119.00 cm/s AV Vmean:          89.400 cm/s AV VTI:            0.235 m AV Peak Grad:      5.7 mmHg AV Mean Grad:      3.0 mmHg LVOT Vmax:         105.00 cm/s LVOT Vmean:        75.200 cm/s LVOT VTI:          0.235 m LVOT/AV VTI ratio: 1.00  AORTA Ao Root diam: 2.62 cm MITRAL VALVE MV Area (PHT): 2.87 cm    SHUNTS MV Decel Time: 264 msec    Systemic VTI:  0.24 m MV E velocity: 67.60 cm/s  Systemic Diam: 1.96 cm MV A velocity: 57.60 cm/s MV E/A ratio:  1.17 Shelda Bruckner MD Electronically signed by Shelda Bruckner MD Signature Date/Time: 09/09/2024/1:16:31 PM    Final    Scheduled Meds:  atorvastatin   80 mg Oral QPM   clopidogrel   75 mg Oral Daily   feeding supplement  237 mL Oral BID BM   furosemide   40 mg Oral Daily   heparin   5,000 Units Subcutaneous Q12H   [START ON 09/10/2024] metoprolol  succinate  50 mg Oral Daily   metoprolol  tartrate  12.5 mg Oral Q6H   pantoprazole   40 mg Oral Daily   sodium chloride  flush  3 mL Intravenous Q12H   Continuous Infusions:   LOS: 0 days   Alejandro Marker, DO [facility] Available via Epic secure chat 7am-7pm After these hours, please refer to coverage provider listed on [email or web address] 09/09/2024, 6:20 PM

## 2024-09-09 NOTE — Consult Note (Signed)
 Cardiology Consultation   Patient ID: Matthew Liu MRN: [id number]; DOB: 1940-11-27  Admit date: 09/06/2024 Date of Consult: 09/09/2024  PCP:  Matthew Vicenta BRAVO, MD   [facility] Providers Cardiologist:  Matthew Bihari, MD      Patient Profile: Matthew Liu is a 83 y.o. male with a hx of Barrett's esophagus, prior CVA, osteoporosis, OSA on CPAP, pulmonary fibrosis, Parkinson's, HFmrEF, and CAD s/p DES and CABG who is being seen 09/09/2024 for the evaluation of A-fib with RVR at the request of Matthew Marker DO.  History of Present Illness: [name] is an 83 year old male with prior cardiac history listed below  In 2014 the patient had an inferior STEMI and received DES x 2 to the ostial to mid RCA.  On 12/2018 the patient had CABG x5 with LIMA to LAD.  On 09/2022 the patient had ABIs performed.  Bilateral ABIs were noncompressible and so was right TBI.  Left TBI was 0.80  On 01/2024 the patient was hospitalized at [facility] for worsening shortness of breath and lower extremity edema.  The patient had an echocardiogram performed that showed a moderately reduced LVEF of 40 to 45%, global hypokinesis, severely dilated left atrium, mild MAC, small atrial defect, mild MR, mild AR, and mildly elevated pulmonary artery systolic pressure of 38 mm.  The patient has had multiple falls and fractures in the last year.  On 02/2024 the patient fell and the patient had intertrochanteric fracture fixation of the left hip.  On 03/2024 the patient fell again and had an ORIF of the right hip.  On 09/06/2024 the patient was hospitalized following a fall.  Imaging showed that he had a fracture to the right pubic rami.  Orthopedics saw the patient and recommended PT/OT and no surgical intervention.  On 09/08/2024 the patient went into A-fib with RVR.  The patient was started on Lopressor  12.5 mg every 6 hours.  Most recent BP was 132/52.  On interview patient reported that he did not have  any symptoms with atrial fibrillation.  Denied any prior history of atrial fibrillation.  Denied nicotine use or illicit substance use.  Drinks beer socially.  Reported that he worked with PT after his fractures.  Is able to walk around grocery large stores.  Stated that he lives at home with his wife.  Denies any melena, hematochezia, or hematuria.   Labs showed potassium of 4.3, creatinine of 1.15, hypocalcemia of 8.2, hypoalbuminemia of 2.8, WBC count of 5.6, and anemia with a hemoglobin of 9.5,  Chest X-ray showed Low lung volumes. Blunting of the left costophrenic sulcus with patchy retrocardiac airspace opacities, possibly representing atelectasis or aspiration with trace left pleural effusion.  EKG showed coarse atrial fibrillation with a rate of 105, and LVH.  Past Medical History:  Diagnosis Date   Acute ischemic stroke (HCC) 09/15/2015   Ankle fracture, left 2009   rod placed   Ankle fracture, left    rod placed   Anxiety    Aspirin  allergy    a. Anaphylactic rxn.   Barrett's esophagus    CAD (coronary artery disease)    a. Inf STEMI 07/2013: s/p DES x 2 from ostial to [facility] 07/13/13. Residual Cx disease for med rx (PCI if recurrent pain).   Controlled gout    Dyslipidemia    ED (erectile dysfunction)    Edema of both legs    Gastric polyp    GERD (gastroesophageal reflux disease)    Hiatal hernia  History of nuclear stress test    Nuclear stress test 5/19: EF 53, no ischemia, no infarction; Low Risk   Hydrocele of testis    Hyperglycemia    a. A1C 5.8 in 07/2013.   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   OSA (obstructive sleep apnea)    Severe obstructive sleep apnea with an AHI of 40.4/h on auto CPAP   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   LIMA to LAD, SVG to Diag, Sequential SVG to OM2-OM3, SVG to RCA, EVH via right thigh and leg   Unstable angina [facility])     Past Surgical History:  Procedure  Laterality Date   BACK SURGERY     L1 kyphoplasty by Dr Malcolm 03/2018     thoractic 02-2018   cataract surgery      bilateral    COLONOSCOPY     CORONARY ANGIOPLASTY     stents    CORONARY ARTERY BYPASS GRAFT N/A 12/12/2018   Procedure: CORONARY ARTERY BYPASS GRAFTING (CABG);  Surgeon: Dusty Sudie DEL, MD;  Location: [facility] OR;  Service: Open Heart Surgery;  Laterality: N/A;   ESOPHAGOGASTRODUODENOSCOPY  04/04/2016   Short-segment Barrett's esophagus (biopsied) Gastric polyps (status post polypectomy x1) Hiatal hernia   HYDROCELE EXCISION Left 07/17/2014   Procedure: LEFT HYDROCELECTOMY;  Surgeon: Norleen JINNY Seltzer, MD;  Location: [facility] ORS;  Service: Urology;  Laterality: Left;   INTRAMEDULLARY (IM) NAIL INTERTROCHANTERIC Left 02/19/2024   Procedure: FIXATION, FRACTURE, INTERTROCHANTERIC, WITH INTRAMEDULLARY ROD;  Surgeon: Kendal Franky SQUIBB, MD;  Location: [facility] OR;  Service: Orthopedics;  Laterality: Left;   left ankle surgery  1999   LEFT HEART CATH AND CORONARY ANGIOGRAPHY N/A 12/11/2018   Procedure: LEFT HEART CATH AND CORONARY ANGIOGRAPHY;  Surgeon: Claudene Victory ORN, MD;  Location: [facility] INVASIVE CV LAB;  Service: Cardiovascular;  Laterality: N/A;   LEFT HEART CATHETERIZATION WITH CORONARY ANGIOGRAM N/A 07/13/2013   Procedure: LEFT HEART CATHETERIZATION WITH CORONARY ANGIOGRAM;  Surgeon: Victory ORN Claudene DOUGLAS, MD;  Location: [facility] CATH LAB;  Service: Cardiovascular;  Laterality: N/A;   ORIF HIP FRACTURE Right 03/24/2024   Procedure: OPEN REDUCTION INTERNAL FIXATION HIP;  Surgeon: Edna Toribio LABOR, MD;  Location: [facility] OR;  Service: Orthopedics;  Laterality: Right;   PERCUTANEOUS CORONARY STENT INTERVENTION (PCI-S)  07/13/2013   Procedure: PERCUTANEOUS CORONARY STENT INTERVENTION (PCI-S);  Surgeon: Victory ORN Claudene DOUGLAS, MD;  Location: [facility] CATH LAB;  Service: Cardiovascular;;   TEE WITHOUT CARDIOVERSION N/A 12/12/2018   Procedure: TRANSESOPHAGEAL ECHOCARDIOGRAM (TEE);  Surgeon: Dusty Sudie DEL, MD;  Location: [facility] OR;  Service:  Open Heart Surgery;  Laterality: N/A;   TONSILLECTOMY       Home Medications:  Prior to Admission medications   Medication Sig Start Date End Date Taking? Authorizing Provider  acetaminophen  (TYLENOL ) 500 MG tablet Take 2 tablets (1,000 mg total) by mouth every 8 (eight) hours as needed for mild pain (pain score 1-3). 03/27/24  Yes Krishnan, Gokul, MD  atorvastatin  (LIPITOR ) 80 MG tablet Take 1 tablet (80 mg total) by mouth every evening. 08/01/24  Yes Lucien, Tessa N, PA-C  carvedilol  (COREG ) 3.125 MG tablet Take 1 tablet (3.125 mg total) by mouth 2 (two) times daily with a meal. 08/01/24  Yes Conte, Tessa N, PA-C  Cholecalciferol  (VITAMIN D3) 50 MCG (2000 UT) capsule Take 2,000 Units by mouth daily.   Yes [provider]  clopidogrel  (PLAVIX ) 75 MG tablet Take 1  tablet (75 mg total) by mouth daily. 08/01/24  Yes Conte, Tessa N, PA-C  cyanocobalamin  (VITAMIN B12) 100 MCG tablet Take 100 mcg by mouth daily.   Yes [provider]  docusate sodium  (COLACE) 100 MG capsule Take 100 mg by mouth daily as needed for mild constipation.   Yes [provider]  feeding supplement (ENSURE PLUS HIGH PROTEIN) LIQD Take 237 mLs by mouth 2 (two) times daily between meals. Patient taking differently: Take 237 mLs by mouth daily. 03/27/24  Yes Krishnan, Gokul, MD  furosemide  (LASIX ) 40 MG tablet Take 1 tablet (40 mg total) by mouth daily. 08/01/24  Yes Conte, Tessa N, PA-C  losartan  (COZAAR ) 25 MG tablet Take 25 mg by mouth daily. 09/06/24  Yes [provider]  meloxicam (MOBIC) 15 MG tablet Take 15 mg by mouth daily. 09/06/24  Yes [provider]  Multiple Vitamins-Minerals (ONE-A-DAY MENS 50+) TABS Take 1 tablet by mouth daily with breakfast.   Yes [provider]  ondansetron  (ZOFRAN ) 4 MG tablet Take 4 mg by mouth every 6 (six) hours as needed. 06/17/24  Yes [provider]  pantoprazole  (PROTONIX ) 40 MG tablet Take 1 tablet (40 mg total) by mouth  daily. 03/08/23  Yes Charlanne Groom, MD  polyethylene glycol (MIRALAX  / GLYCOLAX ) 17 g packet Take 17 g by mouth daily. Patient taking differently: Take 17 g by mouth daily as needed for mild constipation or moderate constipation. 02/24/24  Yes Will Almarie MATSU, MD    Scheduled Meds:  atorvastatin   80 mg Oral QPM   clopidogrel   75 mg Oral Daily   feeding supplement  237 mL Oral BID BM   heparin   5,000 Units Subcutaneous Q12H   metoprolol  tartrate  12.5 mg Oral Q6H   pantoprazole   40 mg Oral Daily   sodium chloride  flush  3 mL Intravenous Q12H   Continuous Infusions:  PRN Meds: acetaminophen  **OR** acetaminophen , hydrALAZINE , ondansetron  (ZOFRAN ) IV, oxyCODONE   Allergies:    Allergies  Allergen Reactions   Aspirin  Anaphylaxis and Shortness Of Breath   Sulfa Antibiotics Hives and Shortness Of Breath   Quinine Derivatives Hives and Other (See Comments)    looks like he has the measles    Social History:   Social History   Socioeconomic History   Marital status: Married    Spouse name: Garment/textile Technologist   Number of children: 2   Years of education: 12   Highest education level: Not on file  Occupational History   Occupation: Chiropractor  Tobacco Use   Smoking status: Never   Smokeless tobacco: Never  Vaping Use   Vaping status: Never Used  Substance and Sexual Activity   Alcohol use: Yes    Comment: occasional beer    Drug use: No   Sexual activity: Not Currently  Other Topics Concern   Not on file  Social History Narrative   Lives with wife   Caffeine use: 1-2 cups coffee per day   Right handed   Social Drivers of Health   Financial Resource Strain: Not on file  Food Insecurity: No Food Insecurity (09/06/2024)   Hunger Vital Sign    Worried About Running Out of Food in the Last Year: Never true    Ran Out of Food in the Last Year: Never true  Transportation Needs: No Transportation Needs (09/06/2024)   PRAPARE - Scientist, Research (physical Sciences) (Medical): No    Lack of Transportation (Non-Medical): No  Physical Activity: Not  on file  Stress: Not on file  Social Connections: Moderately Integrated (09/06/2024)   Social Connection and Isolation Panel    Frequency of Communication with Friends and Family: Three times a week    Frequency of Social Gatherings with Friends and Family: Three times a week    Attends Religious Services: 1 to 4 times per year    Active Member of Clubs or Organizations: No    Attends Banker Meetings: Never    Marital Status: Married  Catering Manager Violence: Not At Risk (09/06/2024)   Humiliation, Afraid, Rape, and Kick questionnaire    Fear of Current or Ex-Partner: No    Emotionally Abused: No    Physically Abused: No    Sexually Abused: No    Family History:    Family History  Problem Relation Age of Onset   Hypertension Mother    Ovarian cancer Mother    Heart failure Father    Stroke Neg Hx      ROS:  Please see the history of present illness.   All other ROS reviewed and negative.     Physical Exam/Data: Vitals:   09/09/24 0302 09/09/24 0434 09/09/24 0640 09/09/24 1004  BP: (!) 108/56 (!) 124/48 (!) 123/54 (!) 132/52  Pulse: 72 66 97 82  Resp: (!) 29  (!) 22 15  Temp: 98.2 F (36.8 C) 98.2 F (36.8 C) 97.9 F (36.6 C) 97.7 F (36.5 C)  TempSrc:  Oral Oral Oral  SpO2: 97% 96% 95% 100%  Weight:      Height:        Intake/Output Summary (Last 24 hours) at 09/09/2024 1232 Last data filed at 09/09/2024 0600 Gross per 24 hour  Intake --  Output 1025 ml  Net -1025 ml      09/07/2024    5:00 AM 09/06/2024    1:30 PM 08/01/2024    1:56 PM  Last 3 Weights  Weight (lbs) 139 lb 15.9 oz 140 lb 141 lb  Weight (kg) 63.5 kg 63.504 kg 63.957 kg     Body mass index is 21.93 kg/m.  General:  Well nourished, well developed, in no acute distress.  Alert and orientated on room air HEENT: normal Neck: Did not assess JVD as patient was sitting up in  chair and wanted additional assistance to transfer to bed with multiple falls. Vascular: No carotid bruits; Distal pulses 2+ bilaterally Cardiac:  normal S1, S2; RRR; no murmur Lungs:  Bibasilar crackles. Abd: soft, nontender, no hepatomegaly  Ext: no edema Musculoskeletal:  No deformities Skin: warm and dry  Neuro:   no focal abnormalities noted Psych:  Normal affect   EKG:  The EKG was personally reviewed and demonstrates:  atrial fibrillation with a rate of 105, and LVH. Telemetry:  Telemetry was personally reviewed and demonstrates: Patient converted from atrial fibrillation to sinus rhythm this morning at 2:30 AM.  Patient is currently on atrial bigeminy with PACs every other beat.  Heart rates are in the 60s to 80s.  Relevant CV Studies: Echocardiogram showed IMPRESSIONS     1. Left ventricular ejection fraction, by estimation, is 45 to 50%. The  left ventricle has mildly decreased function. The left ventricle  demonstrates global hypokinesis. There is mild left ventricular  hypertrophy. Left ventricular diastolic parameters  are indeterminate.   2. Right ventricular systolic function is normal. The right ventricular  size is normal.   3. Left atrial size was mildly dilated.   4. The mitral valve is  grossly normal. Trivial mitral valve  regurgitation. No evidence of mitral stenosis.   5. The aortic valve is grossly normal. There is moderate calcification of  the aortic valve. There is mild thickening of the aortic valve. Aortic  valve regurgitation is mild. Aortic valve sclerosis/calcification is  present, without any evidence of aortic   stenosis.   6. The inferior vena cava is normal in size with <50% respiratory  variability, suggesting right atrial pressure of 8 mmHg.   Comparison(s): Prior images unable to be directly viewed, comparison made  by report only. Most recent echo 01/19/24 from [facility] reviewed: EF  40-45%, global hypokinesis.    Laboratory  Data: High Sensitivity Troponin:   Recent Labs  Lab 09/08/24 1947  TROPONINIHS 13     Chemistry Recent Labs  Lab 09/07/24 0138 09/08/24 0251 09/09/24 1041  NA 134* 134* 136  K 3.4* 3.7 4.3  CL 103 104 103  CO2 24 24 28   GLUCOSE 187* 128* 120*  BUN 24* 33* 33*  CREATININE 1.11 1.32* 1.15  CALCIUM  7.8* 8.1* 8.2*  MG  --  1.8 1.9  GFRNONAA >60 53* >60  ANIONGAP 7 6 5     Recent Labs  Lab 09/07/24 0138 09/08/24 0251 09/09/24 1041  PROT 5.7* 5.3* 5.9*  ALBUMIN  2.9* 2.8* 2.8*  AST 23 24 30   ALT 16 16 23   ALKPHOS 120 123 119  BILITOT 0.9 1.2 1.0   Lipids No results for input(s): CHOL, TRIG, HDL, LABVLDL, LDLCALC, CHOLHDL in the last 168 hours.  Hematology Recent Labs  Lab 09/07/24 0138 09/08/24 0251 09/09/24 1041  WBC 5.0 6.3 5.6  RBC 2.75* 2.67* 2.67*  HGB 9.6* 9.3* 9.5*  HCT 28.2* 27.6* 27.9*  MCV 102.5* 103.4* 104.5*  MCH 34.9* 34.8* 35.6*  MCHC 34.0 33.7 34.1  RDW 14.1 14.2 14.4  PLT 130* 109* 122*   Thyroid  No results for input(s): TSH, FREET4 in the last 168 hours.  BNPNo results for input(s): BNP, PROBNP in the last 168 hours.  DDimer No results for input(s): DDIMER in the last 168 hours.  Radiology/Studies:  [name] Result Date: 09/06/2024 CLINICAL DATA:  Follow-up examination for C7 fracture. EXAM: MRI CERVICAL SPINE WITHOUT CONTRAST TECHNIQUE: Multiplanar, multisequence MR imaging of the cervical spine was performed. No intravenous contrast was administered. COMPARISON:  Prior CT from earlier the same day. FINDINGS: Alignment: Exaggeration of the normal cervical lordosis. No significant listhesis. Vertebrae: Compression fracture involving the C7 vertebral body with up to 75% height loss without bony retropulsion. This is largely chronic in appearance without significant residual marrow edema. Otherwise, vertebral body height maintained with no other acute or chronic fracture. Bone marrow signal intensity within  normal limits. No worrisome osseous lesions. Reactive marrow edema present about the left C4-5 facet due to facet arthritis. Cord: Normal signal morphology. Posterior Fossa, vertebral arteries, paraspinal tissues: Patchy signal abnormality within the pons, consistent with chronic small vessel ischemic disease. Probable small benign arachnoid cyst noted at the supra cerebellar cistern. Craniocervical junction within normal limits. Paraspinous soft tissues within normal limits. Normal flow voids seen within the vertebral arteries bilaterally. Disc levels: C2-C3: Degenerative intervertebral disc space narrowing with diffuse disc bulge and endplate spurring. Moderate left facet arthrosis. No significant spinal stenosis. Foramina remain patent. C3-C4: Degenerative intervertebral disc space narrowing with diffuse disc osteophyte complex. Left greater than right facet degeneration with bony ankylosis on the left. No more than mild spinal stenosis. Moderate left C4 foraminal narrowing. Right neural  foramen remains patent. C4-C5: Degenerative disc space narrowing with diffuse disc osteophyte complex, asymmetric to the right. Posterior component flattens and partially faces the ventral thecal sac. Moderate left facet arthrosis with reactive marrow edema. Resultant mild-to-moderate spinal stenosis. Severe right C5 foraminal narrowing. Left neural foramina remains patent. C5-C6: Advanced degenerative vertebral disc space narrowing with diffuse disc osteophyte complex, asymmetric to the right. Flattening and partial effacement of the ventral thecal sac. Resultant mild-to-moderate spinal stenosis. Severe right with moderate left C6 foraminal narrowing. C6-C7: Minimal disc bulge. Mild facet degeneration on the right. No spinal stenosis. Foramina remain patent. C7-T1: Negative interspace. Mild right-sided facet hypertrophy. No stenosis. IMPRESSION: 1. Chronic compression fracture involving the C7 vertebral body with up to 75%  height loss without bony retropulsion. No acute fracture within the cervical spine. 2. Multilevel cervical spondylosis with resultant mild to moderate spinal stenosis at C4-5 and C5-6. 3. Multifactorial degenerative changes with resultant multilevel foraminal narrowing as above. Notable findings include moderate left C4 foraminal stenosis, severe right C5 foraminal narrowing, with severe right and moderate left C6 foraminal stenosis. 4. Reactive marrow edema about the left C4-5 facet due to facet arthritis. Finding could serve as a source for neck pain. Electronically Signed   By: Morene Hoard M.D.   On: 09/06/2024 19:03   DG Shoulder Right Result Date: 09/06/2024 CLINICAL DATA:  Trauma EXAM: RIGHT SHOULDER - 2+ VIEW COMPARISON:  None Available. FINDINGS: There is no evidence of fracture or dislocation. There is no evidence of arthropathy or other focal bone abnormality. Soft tissues are unremarkable. IMPRESSION: Negative. Electronically Signed   By: Greig Pique M.D.   On: 09/06/2024 16:02   DG FEMUR PORT, 1V RIGHT Result Date: 09/06/2024 CLINICAL DATA:  Trauma EXAM: RIGHT FEMUR PORTABLE 1 VIEW COMPARISON:  Right femur x-ray 03/24/2024. FINDINGS: Right femoral intramedullary nail with hip screws again seen. There has been interval healing and callus formation of the proximal right femoral fracture. No definite acute fracture or dislocation. No hardware loosening. Peripheral vascular calcifications are present. IMPRESSION: 1. No acute fracture or dislocation. 2. Interval healing and callus formation of the proximal right femoral fracture. Electronically Signed   By: Greig Pique M.D.   On: 09/06/2024 15:56   DG Knee Right Port Result Date: 09/06/2024 CLINICAL DATA:  Clemens today at the grocery store walking to his car. Right knee pain. EXAM: PORTABLE RIGHT KNEE - 1-2 VIEW COMPARISON:  03/24/2024. FINDINGS: No fracture or bone lesion. Knee joint normally spaced and aligned. Distal aspect of an  intramedullary rod is well-seated and positioned and unchanged. No joint effusion. Significant arterial vascular calcifications noted posteriorly. IMPRESSION: 1. No fracture or acute finding.  No joint abnormality. Electronically Signed   By: Alm Parkins M.D.   On: 09/06/2024 15:52   DG Elbow Complete Right Result Date: 09/06/2024 CLINICAL DATA:  Fall onto right side EXAM: RIGHT ELBOW - COMPLETE 4 VIEW COMPARISON:  None Available. FINDINGS: There is no evidence of fracture, dislocation, or joint effusion. There is no evidence of arthropathy or other focal bone abnormality. Soft tissues are unremarkable. IMPRESSION: No acute fracture or dislocation. Electronically Signed   By: Limin  Xu M.D.   On: 09/06/2024 15:52   DG Hand Complete Right Result Date: 09/06/2024 CLINICAL DATA:  Status post fall onto right side EXAM: RIGHT HAND - COMPLETE 3 VIEW COMPARISON:  None Available. FINDINGS: There is no evidence of fracture or dislocation. Mild interphalangeal joint space narrowing. Positive ulnar variance. Soft tissues are unremarkable. IMPRESSION: 1.  No acute fracture or dislocation. 2. Mild degenerative changes of the hand. Electronically Signed   By: Limin  Xu M.D.   On: 09/06/2024 15:51   DG Pelvis Portable Result Date: 09/06/2024 CLINICAL DATA:  Traumarotation differential diagnosis EXAM: PORTABLE PELVIS 1-2 VIEWS COMPARISON:  03/23/2024 FINDINGS: Bilateral femoral intramedullary nails are partially visualized.Cortical irregularity involving the parasymphyseal pubic bone extending into the inferior pubic ramus. Healing fracture of the right femoral neck/proximal femur.No dislocation.Moderate joint space loss of both hips. Multilevel degenerative disc disease of the spine. Peripheral vascular atherosclerosis. IMPRESSION: Longitudinally oriented fracture of the right parasymphyseal pubic bone extending into the inferior pubic ramus. Electronically Signed   By: Rogelia Myers M.D.   On: 09/06/2024 14:43    CT HEAD WO CONTRAST Result Date: 09/06/2024 CLINICAL DATA:  Fall around 1 hour ago. Patient recovering from hip surgery. Patient is on Plavix . EXAM: CT HEAD WITHOUT CONTRAST CT CERVICAL SPINE WITHOUT CONTRAST TECHNIQUE: Multidetector CT imaging of the head and cervical spine was performed following the standard protocol without intravenous contrast. Multiplanar CT image reconstructions of the cervical spine were also generated. RADIATION DOSE REDUCTION: This exam was performed according to the departmental dose-optimization program which includes automated exposure control, adjustment of the mA and/or kV according to patient size and/or use of iterative reconstruction technique. COMPARISON:  02/18/2024. FINDINGS: CT HEAD FINDINGS Brain: No evidence of acute infarction, hemorrhage, hydrocephalus, extra-axial collection or mass lesion/mass effect. There is age-appropriate volume loss and mild periventricular white matter hypoattenuation consistent with chronic microvascular ischemic change. Vascular: No hyperdense vessel or unexpected calcification. Skull: Normal. Negative for fracture or focal lesion. Sinuses/Orbits: Globes and orbits are unremarkable. Sinuses are essentially clear. Other: None. CT CERVICAL SPINE FINDINGS Alignment: Normal. Skull base and vertebrae: Moderate wedge-shaped compression fracture of C7. This appears chronic, and was present on the exam from 02/18/2024, but has increased in severity. No other evidence of a fracture. No bone lesion. Soft tissues and spinal canal: No prevertebral fluid or swelling. No visible canal hematoma. Disc levels: Mild to moderate loss of disc height at C2-C3. Moderate loss of disc height at C3-C4. Marked loss of disc height at C4-C5 and C5-C6. Mild loss of disc height at C7-T1. Endplate spurring minor disc bulging. No convincing disc herniation. Degenerative changes are stable from prior exam. Upper chest: No acute findings. Other: None. IMPRESSION: HEAD CT 1.  No acute intracranial abnormalities and no change from prior study. CERVICAL CT 1. No convincing acute fracture. 2. Moderate wedge-shaped compression fracture of C7, present on the prior cervical CT, but increased in overall severity. There is no adjacent edema to suggest an acute component. Electronically Signed   By: Alm Parkins M.D.   On: 09/06/2024 14:36   CT CERVICAL SPINE WO CONTRAST Result Date: 09/06/2024 CLINICAL DATA:  Fall around 1 hour ago. Patient recovering from hip surgery. Patient is on Plavix . EXAM: CT HEAD WITHOUT CONTRAST CT CERVICAL SPINE WITHOUT CONTRAST TECHNIQUE: Multidetector CT imaging of the head and cervical spine was performed following the standard protocol without intravenous contrast. Multiplanar CT image reconstructions of the cervical spine were also generated. RADIATION DOSE REDUCTION: This exam was performed according to the departmental dose-optimization program which includes automated exposure control, adjustment of the mA and/or kV according to patient size and/or use of iterative reconstruction technique. COMPARISON:  02/18/2024. FINDINGS: CT HEAD FINDINGS Brain: No evidence of acute infarction, hemorrhage, hydrocephalus, extra-axial collection or mass lesion/mass effect. There is age-appropriate volume loss and mild periventricular white matter hypoattenuation consistent with  chronic microvascular ischemic change. Vascular: No hyperdense vessel or unexpected calcification. Skull: Normal. Negative for fracture or focal lesion. Sinuses/Orbits: Globes and orbits are unremarkable. Sinuses are essentially clear. Other: None. CT CERVICAL SPINE FINDINGS Alignment: Normal. Skull base and vertebrae: Moderate wedge-shaped compression fracture of C7. This appears chronic, and was present on the exam from 02/18/2024, but has increased in severity. No other evidence of a fracture. No bone lesion. Soft tissues and spinal canal: No prevertebral fluid or swelling. No visible canal  hematoma. Disc levels: Mild to moderate loss of disc height at C2-C3. Moderate loss of disc height at C3-C4. Marked loss of disc height at C4-C5 and C5-C6. Mild loss of disc height at C7-T1. Endplate spurring minor disc bulging. No convincing disc herniation. Degenerative changes are stable from prior exam. Upper chest: No acute findings. Other: None. IMPRESSION: HEAD CT 1. No acute intracranial abnormalities and no change from prior study. CERVICAL CT 1. No convincing acute fracture. 2. Moderate wedge-shaped compression fracture of C7, present on the prior cervical CT, but increased in overall severity. There is no adjacent edema to suggest an acute component. Electronically Signed   By: Alm Parkins M.D.   On: 09/06/2024 14:36   DG Chest Port 1 View Result Date: 09/06/2024 CLINICAL DATA:  Trauma EXAM: PORTABLE CHEST - 1 VIEW COMPARISON:  March 23, 2024 FINDINGS: Low lung volumes with bronchovascular crowding. Patchy airspace opacities in the left lung base with blunting of the costophrenic sulcus. Mild cardiomegaly. Sternotomy wires and CABG markers. Tortuous aorta with aortic atherosclerosis. Diffuse osteopenia. Multilevel thoracic osteophytosis. IMPRESSION: Low lung volumes. Blunting of the left costophrenic sulcus with patchy retrocardiac airspace opacities, possibly representing atelectasis or aspiration with trace left pleural effusion. Electronically Signed   By: Rogelia Myers M.D.   On: 09/06/2024 14:19     Assessment and Plan: Matthew Liu is a 83 y.o. male with a hx of Barrett's esophagus, prior CVA, osteoporosis, OSA on CPAP, pulmonary fibrosis, Parkinson's, HFmrEF, and CAD s/p DES and CABG who is being seen 09/09/2024 for the evaluation of A-fib with RVR at the request of Matthew Marker DO.  Fall Pubic rami fracture The patient has had multiple falls and fractures in the last year. On 02/2024 the patient fell and the patient had intertrochanteric fracture fixation of the left hip. On 03/2024  the patient fell again and had an ORIF of the right hip. On 09/06/2024 the patient was hospitalized following a fall.  Imaging showed that he had a fracture to the right pubic rami.  Orthopedics saw the patient and recommended PT/OT and no surgical intervention.     New onset atrial fibrillation CHA2DS2-VASc Score = 7 [CHF History: 1, HTN History: 1, Diabetes History: 0, Stroke History: 2, Vascular Disease History: 1, Age Score: 2, Gender Score: 0].  Therefore, the patient's annual risk of stroke is 11.2 %.    Patient denies any prior history of atrial fibrillation.  Denies any symptoms with atrial fibrillation. EKG showed coarse atrial fibrillation with a rate of 105, and LVH. On telemetry the patient converted back into sinus rhythm at 2:30 AM. Mag 1.9, Potassium 4.3 Order TSH Stop metoprolol  tartrate 12.5 mg every 6 hours. Start metoprolol  succinate 50mg  daily. Start Xarelto 15mg  daily    Chronic HFmrEF Echocardiogram showed a moderately reduced LVEF of 45 to 50%, global hypokinesis, trivial MR, mild aortic valve regurgitation, and IVC with less than 50% respiratory variability. restarting home Lasix  40mg  daily Order BNP GDMT Manage metoprolol  succinate as  above. May consider starting Aldactone 12.5 mg daily.   CAD s/p DES and CABG Hyperlipidemia Denies any chest pain or shortness of breath. Order Lipid panel Continue Lipitor  80 mg daily Stop Plavix    Hypertension Prior to admission was on Coreg  3.125 mg twice daily and losartan  25 mg daily.  Most recent BP at 10 AM this morning was 132/52. Management per A-fib above.   OSA on CPAP Encourage CPAP compliance.   Otherwise management per primary     Risk Assessment/Risk Scores:     New [place]  Heart Association (NYHA) Functional Class NYHA Class II  CHA2DS2-VASc Score = 7   This indicates a 11.2% annual risk of stroke. The patient's score is based upon: CHF History: 1 HTN History: 1 Diabetes History: 0 Stroke  History: 2 Vascular Disease History: 1 Age Score: 2 Gender Score: 0        For questions or updates, please contact [facility] Please consult [email or web address] for contact info under      Signed, Morse Clause, PA-C  09/09/2024 12:32 PM

## 2024-09-09 NOTE — Plan of Care (Signed)
  Problem: Pain Managment: Goal: General experience of comfort will improve and/or be controlled Outcome: Progressing   Problem: Safety: Goal: Ability to remain free from injury will improve Outcome: Progressing   Problem: Skin Integrity: Goal: Risk for impaired skin integrity will decrease Outcome: Progressing

## 2024-09-09 NOTE — Plan of Care (Signed)

## 2024-09-10 DIAGNOSIS — I5022 Chronic systolic (congestive) heart failure: Secondary | ICD-10-CM | POA: Diagnosis not present

## 2024-09-10 DIAGNOSIS — I10 Essential (primary) hypertension: Secondary | ICD-10-CM | POA: Diagnosis not present

## 2024-09-10 DIAGNOSIS — I4891 Unspecified atrial fibrillation: Secondary | ICD-10-CM | POA: Diagnosis not present

## 2024-09-10 DIAGNOSIS — W19XXXA Unspecified fall, initial encounter: Secondary | ICD-10-CM | POA: Diagnosis not present

## 2024-09-10 LAB — CBC WITH DIFFERENTIAL/PLATELET
Abs Immature Granulocytes: 0.05 K/uL (ref 0.00–0.07)
Basophils Absolute: 0 K/uL (ref 0.0–0.1)
Basophils Relative: 0 %
Eosinophils Absolute: 0.2 K/uL (ref 0.0–0.5)
Eosinophils Relative: 3 %
HCT: 29.3 % — ABNORMAL LOW (ref 39.0–52.0)
Hemoglobin: 10 g/dL — ABNORMAL LOW (ref 13.0–17.0)
Immature Granulocytes: 1 %
Lymphocytes Relative: 15 %
Lymphs Abs: 1.1 K/uL (ref 0.7–4.0)
MCH: 35.2 pg — ABNORMAL HIGH (ref 26.0–34.0)
MCHC: 34.1 g/dL (ref 30.0–36.0)
MCV: 103.2 fL — ABNORMAL HIGH (ref 80.0–100.0)
Monocytes Absolute: 0.7 K/uL (ref 0.1–1.0)
Monocytes Relative: 10 %
Neutro Abs: 5.2 K/uL (ref 1.7–7.7)
Neutrophils Relative %: 71 %
Platelets: 135 K/uL — ABNORMAL LOW (ref 150–400)
RBC: 2.84 MIL/uL — ABNORMAL LOW (ref 4.22–5.81)
RDW: 14.2 % (ref 11.5–15.5)
WBC: 7.2 K/uL (ref 4.0–10.5)
nRBC: 0 % (ref 0.0–0.2)

## 2024-09-10 LAB — COMPREHENSIVE METABOLIC PANEL WITH GFR
ALT: 23 U/L (ref 0–44)
AST: 25 U/L (ref 15–41)
Albumin: 3 g/dL — ABNORMAL LOW (ref 3.5–5.0)
Alkaline Phosphatase: 128 U/L — ABNORMAL HIGH (ref 38–126)
Anion gap: 7 (ref 5–15)
BUN: 43 mg/dL — ABNORMAL HIGH (ref 8–23)
CO2: 28 mmol/L (ref 22–32)
Calcium: 8.1 mg/dL — ABNORMAL LOW (ref 8.9–10.3)
Chloride: 99 mmol/L (ref 98–111)
Creatinine, Ser: 1.42 mg/dL — ABNORMAL HIGH (ref 0.61–1.24)
GFR, Estimated: 49 mL/min — ABNORMAL LOW (ref >60)
Glucose, Bld: 161 mg/dL — ABNORMAL HIGH (ref 70–99)
Potassium: 4.1 mmol/L (ref 3.5–5.1)
Sodium: 134 mmol/L — ABNORMAL LOW (ref 135–145)
Total Bilirubin: 1 mg/dL (ref 0.0–1.2)
Total Protein: 6.5 g/dL (ref 6.5–8.1)

## 2024-09-10 LAB — LIPID PANEL
Cholesterol: 99 mg/dL (ref 0–200)
HDL: 48 mg/dL (ref >40)
LDL Cholesterol: 44 mg/dL (ref 0–99)
Total CHOL/HDL Ratio: 2.1 ratio
Triglycerides: 37 mg/dL (ref <150)
VLDL: 7 mg/dL (ref 0–40)

## 2024-09-10 LAB — MAGNESIUM: Magnesium: 2 mg/dL (ref 1.7–2.4)

## 2024-09-10 LAB — BRAIN NATRIURETIC PEPTIDE: B Natriuretic Peptide: 273.5 pg/mL — ABNORMAL HIGH (ref 0.0–100.0)

## 2024-09-10 LAB — TSH: TSH: 2.252 u[IU]/mL (ref 0.350–4.500)

## 2024-09-10 LAB — PHOSPHORUS: Phosphorus: 4.9 mg/dL — ABNORMAL HIGH (ref 2.5–4.6)

## 2024-09-10 MED ORDER — LOSARTAN POTASSIUM 25 MG PO TABS
12.5000 mg | ORAL_TABLET | Freq: Every day | ORAL | Status: DC
  Administered 2024-09-10: 12.5 mg via ORAL
  Filled 2024-09-10 (×2): qty 0.5

## 2024-09-10 NOTE — Progress Notes (Signed)
 PROGRESS NOTE    Matthew Liu  FMW:[id number] DOB: [birth year] DOA: 09/06/2024 PCP: Keren Vicenta BRAVO, MD   Brief Narrative:  Matthew Liu is a 83 y.o. male with past medical history of CAD status post CABG, IPF, OSA, history of CVA, Parkinson disease with mild cognitive impairment, and left hip fracture status postsurgical repair on 02/19/2024, who was admitted after fall.  Patient drove himself to [employer] and after getting his groceries and on his way back he was walking towards the, where he fell over the divider in the parking lot. He did not lose consciousness. Futher workup revealed a C7 Wedge compression fracture, Right sided Pubic rami fracture, and New onset A Fib with RVR. Cardiology consulted for further evaluation and they are currently adjusting meds and considering obtaining EP evaluation to see if the patient is watchman candidate. PT/OT recommending SNF.  Assessment and Plan:  Falls: Patient has a history of parkinsonism and frequent falls. C/w Fall precautions. Patient does not take any parkinsonian medications. PT/OT recommending SNF   C7 wedge compression fracture, old: Neurosurgery consulted and they advised that this fracture is old. 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 No acute fracture within the cervical spine. The MRI also showed  Multilevel cervical spondylosis with resultant mild to moderate spinal stenosis at C4-5 and C5-6 as well as  Multifactorial degenerative changes with resultant multilevel foraminal narrowing as above. Notable findings include moderate left C4 foraminal stenosis, severe right C5 foraminal narrowing, with severe right and moderate left C6 foraminal stenosis. -MRI also showed Reactive marrow edema about the left C4-5 facet due to facet arthritis. Finding could serve as a source for neck pain. -No need for hard collar -C/w Fall Precautions    Right Pubic Rami Fracture: Orthopedic consult appreciated and  Advised non-operative management and WBAT; C/w Pain control with Acetaminophen  po/RC q6hprn Mild Pain and Oxycodone  5 mg po q4hprn Moderate and Severe Pain -PT/OT recommending SNF and bed is chosen at [facility] with [facility] pending   New Onset A Fib with RVR s/p Conversion to NSR: CHA2DS2-VASc Score of 7; Checking TSH. On Metoprolol  Tartrate 12.5 mg q6h. CTM on Telemetry. Cardiology planning on stopping current meds and switching to Metoprolol  Succinate 50 mg po Daily and starting AC w/ Rivaroxaban 15 mg po Daily. ECHO as below.  Cardiology is restarting his home furosemide  40 mg p.o. daily.  Given that he has high risk for CVA given his elevated CHA2DS2-VASc score cardiology is debating about initiating DOAC.  Ortho has cleared him for DOAC and they will consider starting him on 5 mg Eliquis but will only use in the interim until they decide if he is going to go to SNF and also get EP input.  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 Patient being started on Metoprolol  Succinate 50 mg po Daily. Strict I's and O's and Daily Weights. ECHo done and showed EF of 45-50% w/ global hypokinesis and indeterminate LVDP.  Cardiology also recommending considering starting Spironolactone 12.5 mg p.o. daily added and SGLT2 inhibitor prior to discharge but today have initiated losartan  12.5 mg p.o. daily and then transitioning to Entresto if his blood pressure tolerates.  Continue with home furosemide  40 g p.o. daily.  BNP was elevated 273.5  PAD/CAD s/p DES and CABG: Clinically stable. Resumed Clopidogrel  75 mg po Daily and Atorvastatin  80 mg po Daily. C/w Metoprolol  Succinate 50 mg po Daily.  Cardiology recommends no aspirin  since he may end up being on a DOAC.  Cardiology feels it likely occurred from his fall and  orthopedic issues but they will consider monitoring with an outpatient stress PET/CT to rule out ischemia  HLD: Cardiology checked Lipid Panel and it showed a total cholesterol/HDL ratio of 2.1, cholesterol level of 99, HDL 48, LDL 44, Triglycerides of 37, VLDL of 7. C/w Atorvastatin  80 mg po Daily.   Essential HTN: PTA was on Carvedilol  3.125 mg po BID and Losartan  25 mg po Daily. Cardiology planning on changing meds and now Metoprolol  Succinate 50 mg po Daily and Losartan  12.5 mg po Daily. CTM BP per Protocol. Last BP reading was elevated at 165/65  Macrocytic Anemia: Hgb/Hct Trend:  Recent Labs  Lab 09/06/24 1351 09/06/24 1351 09/06/24 1530 09/07/24 0138 09/08/24 0251 09/09/24 1041  HGB 12.2*  --  10.9* 9.6* 9.3* 9.5*  HCT 36.0*  --  32.4* 28.2* 27.6* 27.9*  MCV  --    < > 103.5* 102.5* 103.4* 104.5*   < > = values in this interval not displayed.  -Check Anemia Panel. CTM for S/Sx of Bleeding; No overt bleeding noted. Repeat CBC today pending  Thrombocytopenia: Plt Count Trend:  Recent Labs  Lab 09/06/24 1530 09/07/24 0138 09/08/24 0251 09/09/24 1041  PLT 154 130* 109* 122*  -CTM for S/Sx of Bleeding; no overt bleeding noted. Repeat CBC today pending    Parkinson's Disease with mild cognitive impairment: Not taking Parkinsonian medications at this point at home. Will need Outpt Follow up with Neurology    GERD/History of Barrett's esophagus/GI Prophylaxis: IV PPI changed to po Pantoprazole  40 mg po Daily. C/w Aspiration Precautions  OSA: C/w CPAP qHS  Hypoalbuminemia: Patient's Albumin  Lvl went from 3.6 -> 2.9 -> 2.8 x2. CTM & Trend & repeat CMPt today pending    DVT prophylaxis: heparin  injection 5,000 Units Start: 09/06/24 2200 SCDs Start: 09/06/24 1846    Code Status: Full Code Family Communication: Discussed with the daughter at bedside  Disposition Plan:  Level of care: Telemetry Status is: Inpatient Remains inpatient appropriate because: Needs further clinical  improvement and clearance by the cardiology specialist.  PT OT recommending SNF and awaiting insurance authorization   Consultants:  Cardiology  Procedures:  As delineated above  Antimicrobials:  Anti-infectives (From admission, onward)    None       Subjective: Seen and examined at bedside is resting and felt okay.  Heart rate is improved and still in sinus.  No nausea or vomiting.  Continues to have some pain.  No other concerns or complaints at this time.  Objective: Vitals:   09/10/24 0330 09/10/24 0400 09/10/24 0736 09/10/24 1505  BP:   122/74 (!) 165/65  Pulse:   78 81  Resp: 18     Temp:   97.9 F (36.6 C) 98.7 F (37.1 C)  TempSrc:   Oral Oral  SpO2:   96% 95%  Weight:  66.9 kg    Height:        Intake/Output Summary (Last 24 hours) at 09/10/2024 1649 Last data filed at 09/10/2024 1100 Gross per 24 hour  Intake 240 ml  Output 1850 ml  Net -1610 ml   Filed Weights   09/06/24 1330 09/07/24 0500 09/10/24 0400  Weight: 63.5 kg 63.5 kg 66.9 kg  Examination: Physical Exam:  Constitutional: Thin elderly Caucasian male in no acute distress Respiratory: Slightly diminished to auscultation bilaterally, no wheezing, rales, rhonchi or crackles. Normal respiratory effort and patient is not tachypenic. No accessory muscle use.  Unlabored breathing Cardiovascular: RRR, no murmurs / rubs / gallops. S1 and S2 auscultated.  Mild extremity edema Abdomen: Soft, non-tender, non-distended. Bowel sounds positive.  GU: Deferred. Musculoskeletal: No clubbing / cyanosis of digits/nails. No joint deformity upper and lower extremities.  Skin: No rashes, lesions, ulcers on limited skin evaluation. No induration; Warm and dry.  Neurologic: CN 2-12 grossly intact with no focal deficits. Romberg sign and cerebellar reflexes not assessed.  Psychiatric: Normal judgment and insight. Alert and oriented x 3. Normal mood and appropriate affect.   Data Reviewed: I have personally reviewed  following labs and imaging studies  CBC: Recent Labs  Lab 09/06/24 1351 09/06/24 1530 09/07/24 0138 09/08/24 0251 09/09/24 1041  WBC  --  8.0 5.0 6.3 5.6  NEUTROABS  --  6.1  --   --  3.4  HGB 12.2* 10.9* 9.6* 9.3* 9.5*  HCT 36.0* 32.4* 28.2* 27.6* 27.9*  MCV  --  103.5* 102.5* 103.4* 104.5*  PLT  --  154 130* 109* 122*   Basic Metabolic Panel: Recent Labs  Lab 09/06/24 1351 09/06/24 1530 09/07/24 0138 09/08/24 0251 09/09/24 1041  NA 141 139 134* 134* 136  K 3.9 3.9 3.4* 3.7 4.3  CL 102 105 103 104 103  CO2  --  25 24 24 28   GLUCOSE 101* 118* 187* 128* 120*  BUN 27* 24* 24* 33* 33*  CREATININE 1.30* 1.08 1.11 1.32* 1.15  CALCIUM   --  8.4* 7.8* 8.1* 8.2*  MG  --   --   --  1.8 1.9  PHOS  --   --   --   --  3.5   GFR: Estimated Creatinine Clearance: 44.7 mL/min (by C-G formula based on SCr of 1.15 mg/dL). Liver Function Tests: Recent Labs  Lab 09/06/24 1530 09/07/24 0138 09/08/24 0251 09/09/24 1041  AST 27 23 24 30   ALT 21 16 16 23   ALKPHOS 162* 120 123 119  BILITOT 0.9 0.9 1.2 1.0  PROT 6.5 5.7* 5.3* 5.9*  ALBUMIN  3.6 2.9* 2.8* 2.8*   No results for input(s): LIPASE, AMYLASE in the last 168 hours. No results for input(s): AMMONIA in the last 168 hours. Coagulation Profile: No results for input(s): INR, PROTIME in the last 168 hours. Cardiac Enzymes: No results for input(s): CKTOTAL, CKMB, CKMBINDEX, TROPONINI in the last 168 hours. BNP (last 3 results) No results for input(s): PROBNP in the last 8760 hours. HbA1C: No results for input(s): HGBA1C in the last 72 hours. CBG: No results for input(s): GLUCAP in the last 168 hours. Lipid Profile: Recent Labs    09/10/24 0353  CHOL 99  HDL 48  LDLCALC 44  TRIG 37  CHOLHDL 2.1   Thyroid  Function Tests: Recent Labs    09/10/24 0353  TSH 2.252   Anemia Panel: No results for input(s): VITAMINB12, FOLATE, FERRITIN, TIBC, IRON, RETICCTPCT in the last 72  hours. Sepsis Labs: Recent Labs  Lab 09/06/24 1352  LATICACIDVEN 1.2   Recent Results (from the past 240 hours)  Surgical PCR screen     Status: None   Collection Time: 09/06/24 11:22 PM   Specimen: Nasal Mucosa; Nasal Swab  Result Value Ref Range Status   MRSA, PCR NEGATIVE NEGATIVE Final   Staphylococcus aureus NEGATIVE NEGATIVE Final  Comment: (NOTE) The Xpert SA Assay (FDA approved for NASAL specimens in patients 19 years of age and older), is one component of a comprehensive surveillance program. It is not intended to diagnose infection nor to guide or monitor treatment. Performed at [facility], [address] [place], [place] [address]     Radiology Studies: ECHOCARDIOGRAM COMPLETE Result Date: 09/09/2024    ECHOCARDIOGRAM REPORT   Patient Name:   Matthew Liu Date of Exam: 09/09/2024 Medical Rec #:  [id number]     Height:       67.0 in Accession #:    [id number]    Weight:       140.0 lb Date of Birth:  02-04-1941     BSA:          1.738 m Patient Age:    83 years      BP:           123/54 mmHg Patient Gender: M             HR:           78 bpm. Exam Location:  Inpatient Procedure: 2D Echo, Cardiac Doppler and Color Doppler (Both Spectral and Color            Flow Doppler were utilized during procedure). Indications:    Atrial Fibrillation I48.91  History:        Patient has prior history of Echocardiogram examinations, most                 recent 12/11/2018. CAD and Previous Myocardial Infarction; Risk                 Factors:Hypertension and Sleep Apnea.  Sonographer:    Jayson Gaskins Referring Phys: [id number] NATALIE M DONATI-GARMON IMPRESSIONS  1. Left ventricular ejection fraction, by estimation, is 45 to 50%. The left ventricle has mildly decreased function. The left ventricle demonstrates global hypokinesis. There is mild left ventricular hypertrophy. Left ventricular diastolic parameters are indeterminate.  2. Right ventricular systolic function is normal. The right  ventricular size is normal.  3. Left atrial size was mildly dilated.  4. The mitral valve is grossly normal. Trivial mitral valve regurgitation. No evidence of mitral stenosis.  5. The aortic valve is grossly normal. There is moderate calcification of the aortic valve. There is mild thickening of the aortic valve. Aortic valve regurgitation is mild. Aortic valve sclerosis/calcification is present, without any evidence of aortic  stenosis.  6. The inferior vena cava is normal in size with <50% respiratory variability, suggesting right atrial pressure of 8 mmHg. Comparison(s): Prior images unable to be directly viewed, comparison made by report only. Most recent echo 01/19/24 from [facility] reviewed: EF 40-45%, global hypokinesis. FINDINGS  Left Ventricle: Left ventricular ejection fraction, by estimation, is 45 to 50%. The left ventricle has mildly decreased function. The left ventricle demonstrates global hypokinesis. The left ventricular internal cavity size was normal in size. There is  mild left ventricular hypertrophy. Abnormal (paradoxical) septal motion consistent with post-operative status. Left ventricular diastolic parameters are indeterminate. Right Ventricle: The right ventricular size is normal. No increase in right ventricular wall thickness. Right ventricular systolic function is normal. Left Atrium: Left atrial size was mildly dilated. Right Atrium: Right atrial size was normal in size. Pericardium: There is no evidence of pericardial effusion. Mitral Valve: The mitral valve is grossly normal. Trivial mitral valve regurgitation. No evidence of mitral valve stenosis. Tricuspid Valve: The tricuspid valve is normal  in structure. Tricuspid valve regurgitation is mild . No evidence of tricuspid stenosis. Aortic Valve: The aortic valve is grossly normal. There is moderate calcification of the aortic valve. There is mild thickening of the aortic valve. Aortic valve regurgitation is mild. Aortic valve  sclerosis/calcification is present, without any evidence  of aortic stenosis. Aortic valve mean gradient measures 3.0 mmHg. Aortic valve peak gradient measures 5.7 mmHg. Aortic valve area, by VTI measures 3.02 cm. Pulmonic Valve: The pulmonic valve was grossly normal. Pulmonic valve regurgitation is mild. No evidence of pulmonic stenosis. Aorta: The aortic root is normal in size and structure. Venous: The inferior vena cava is normal in size with less than 50% respiratory variability, suggesting right atrial pressure of 8 mmHg. IAS/Shunts: No atrial level shunt detected by color flow Doppler.  LEFT VENTRICLE PLAX 2D LVIDd:         4.05 cm   Diastology LVIDs:         3.40 cm   LV e' medial:    5.33 cm/s LV PW:         1.20 cm   LV E/e' medial:  12.7 LV IVS:        1.20 cm   LV e' lateral:   13.40 cm/s LVOT diam:     1.96 cm   LV E/e' lateral: 5.0 LV SV:         71 LV SV Index:   41 LVOT Area:     3.02 cm  RIGHT VENTRICLE TAPSE (M-mode): 2.0 cm LEFT ATRIUM           Index        RIGHT ATRIUM           Index LA Vol (A4C): 52.3 ml 30.10 ml/m  RA Area:     13.40 cm                                    RA Volume:   29.00 ml  16.69 ml/m  AORTIC VALVE AV Area (Vmax):    2.66 cm AV Area (Vmean):   2.54 cm AV Area (VTI):     3.02 cm AV Vmax:           119.00 cm/s AV Vmean:          89.400 cm/s AV VTI:            0.235 m AV Peak Grad:      5.7 mmHg AV Mean Grad:      3.0 mmHg LVOT Vmax:         105.00 cm/s LVOT Vmean:        75.200 cm/s LVOT VTI:          0.235 m LVOT/AV VTI ratio: 1.00  AORTA Ao Root diam: 2.62 cm MITRAL VALVE MV Area (PHT): 2.87 cm    SHUNTS MV Decel Time: 264 msec    Systemic VTI:  0.24 m MV E velocity: 67.60 cm/s  Systemic Diam: 1.96 cm MV A velocity: 57.60 cm/s MV E/A ratio:  1.17 Shelda Bruckner MD Electronically signed by Shelda Bruckner MD Signature Date/Time: 09/09/2024/1:16:31 PM    Final    Scheduled Meds:  atorvastatin   80 mg Oral QPM   clopidogrel   75 mg Oral Daily   feeding  supplement  237 mL Oral BID BM   furosemide   40 mg Oral Daily   heparin   5,000 Units Subcutaneous Q12H  losartan   12.5 mg Oral Daily   metoprolol  succinate  50 mg Oral Daily   pantoprazole   40 mg Oral Daily   sodium chloride  flush  3 mL Intravenous Q12H   Continuous Infusions:   LOS: 1 day   Alejandro Marker, DO [facility] Available via Epic secure chat 7am-7pm After these hours, please refer to coverage provider listed on [email or web address] 09/10/2024, 4:49 PM

## 2024-09-10 NOTE — Progress Notes (Signed)
 Progress Note  Patient Name: Matthew Liu Date of Encounter: 09/10/2024  [facility] Cardiologist: Wilbert Bihari, MD   Patient Profile     Subjective   Denies any chest pain or shortness of breath.  No further atrial fibrillation on telemetry  Inpatient Medications    Scheduled Meds:  atorvastatin   80 mg Oral QPM   clopidogrel   75 mg Oral Daily   feeding supplement  237 mL Oral BID BM   furosemide   40 mg Oral Daily   heparin   5,000 Units Subcutaneous Q12H   metoprolol  succinate  50 mg Oral Daily   pantoprazole   40 mg Oral Daily   sodium chloride  flush  3 mL Intravenous Q12H   Continuous Infusions:  PRN Meds: acetaminophen  **OR** acetaminophen , hydrALAZINE , ondansetron  (ZOFRAN ) IV, oxyCODONE    Vital Signs    Vitals:   09/10/24 0321 09/10/24 0330 09/10/24 0400 09/10/24 0736  BP: (!) 129/49   122/74  Pulse: (!) 41   78  Resp:  18    Temp: 98.7 F (37.1 C)   97.9 F (36.6 C)  TempSrc: Oral   Oral  SpO2: 95%   96%  Weight:   66.9 kg   Height:        Intake/Output Summary (Last 24 hours) at 09/10/2024 0902 Last data filed at 09/10/2024 0757 Gross per 24 hour  Intake --  Output 1650 ml  Net -1650 ml      09/10/2024    4:00 AM 09/07/2024    5:00 AM 09/06/2024    1:30 PM  Last 3 Weights  Weight (lbs) 147 lb 7.8 oz 139 lb 15.9 oz 140 lb  Weight (kg) 66.9 kg 63.5 kg 63.504 kg      Telemetry    Normal sinus rhythm- Personally Reviewed  ECG    No new EKG to review- Personally Reviewed  Physical Exam   GEN: No acute distress.   Neck: No JVD Cardiac: RRR, no murmurs, rubs, or gallops.  Respiratory: Clear to auscultation bilaterally. GI: Soft, nontender, non-distended  MS: No edema; No deformity. Neuro:  Nonfocal  Psych: Normal affect   Labs    High Sensitivity Troponin:   Recent Labs  Lab 09/08/24 1947  TROPONINIHS 13      Chemistry Recent Labs  Lab 09/07/24 0138 09/08/24 0251 09/09/24 1041  NA 134* 134* 136  K 3.4* 3.7 4.3   CL 103 104 103  CO2 24 24 28   GLUCOSE 187* 128* 120*  BUN 24* 33* 33*  CREATININE 1.11 1.32* 1.15  CALCIUM  7.8* 8.1* 8.2*  PROT 5.7* 5.3* 5.9*  ALBUMIN  2.9* 2.8* 2.8*  AST 23 24 30   ALT 16 16 23   ALKPHOS 120 123 119  BILITOT 0.9 1.2 1.0  GFRNONAA >60 53* >60  ANIONGAP 7 6 5      Hematology Recent Labs  Lab 09/07/24 0138 09/08/24 0251 09/09/24 1041  WBC 5.0 6.3 5.6  RBC 2.75* 2.67* 2.67*  HGB 9.6* 9.3* 9.5*  HCT 28.2* 27.6* 27.9*  MCV 102.5* 103.4* 104.5*  MCH 34.9* 34.8* 35.6*  MCHC 34.0 33.7 34.1  RDW 14.1 14.2 14.4  PLT 130* 109* 122*    BNP Recent Labs  Lab 09/10/24 0353  BNP 273.5*     DDimer No results for input(s): DDIMER in the last 168 hours.   CHA2DS2-VASc Score = 7   This indicates a 11.2% annual risk of stroke. The patient's score is based upon: CHF History: 1 HTN History: 1 Diabetes History: 0  Stroke History: 2 Vascular Disease History: 1 Age Score: 2 Gender Score: 0      Radiology    ECHOCARDIOGRAM COMPLETE Result Date: 09/09/2024    ECHOCARDIOGRAM REPORT   Patient Name:   Matthew Liu Date of Exam: 09/09/2024 Medical Rec #:  [id number]     Height:       67.0 in Accession #:    [id number]    Weight:       140.0 lb Date of Birth:  1941/06/07     BSA:          1.738 m Patient Age:    83 years      BP:           123/54 mmHg Patient Gender: M             HR:           78 bpm. Exam Location:  Inpatient Procedure: 2D Echo, Cardiac Doppler and Color Doppler (Both Spectral and Color            Flow Doppler were utilized during procedure). Indications:    Atrial Fibrillation I48.91  History:        Patient has prior history of Echocardiogram examinations, most                 recent 12/11/2018. CAD and Previous Myocardial Infarction; Risk                 Factors:Hypertension and Sleep Apnea.  Sonographer:    Jayson Gaskins Referring Phys: [id number] NATALIE M DONATI-GARMON IMPRESSIONS  1. Left ventricular ejection fraction, by estimation, is 45 to 50%. The  left ventricle has mildly decreased function. The left ventricle demonstrates global hypokinesis. There is mild left ventricular hypertrophy. Left ventricular diastolic parameters are indeterminate.  2. Right ventricular systolic function is normal. The right ventricular size is normal.  3. Left atrial size was mildly dilated.  4. The mitral valve is grossly normal. Trivial mitral valve regurgitation. No evidence of mitral stenosis.  5. The aortic valve is grossly normal. There is moderate calcification of the aortic valve. There is mild thickening of the aortic valve. Aortic valve regurgitation is mild. Aortic valve sclerosis/calcification is present, without any evidence of aortic  stenosis.  6. The inferior vena cava is normal in size with <50% respiratory variability, suggesting right atrial pressure of 8 mmHg. Comparison(s): Prior images unable to be directly viewed, comparison made by report only. Most recent echo 01/19/24 from [facility] reviewed: EF 40-45%, global hypokinesis. FINDINGS  Left Ventricle: Left ventricular ejection fraction, by estimation, is 45 to 50%. The left ventricle has mildly decreased function. The left ventricle demonstrates global hypokinesis. The left ventricular internal cavity size was normal in size. There is  mild left ventricular hypertrophy. Abnormal (paradoxical) septal motion consistent with post-operative status. Left ventricular diastolic parameters are indeterminate. Right Ventricle: The right ventricular size is normal. No increase in right ventricular wall thickness. Right ventricular systolic function is normal. Left Atrium: Left atrial size was mildly dilated. Right Atrium: Right atrial size was normal in size. Pericardium: There is no evidence of pericardial effusion. Mitral Valve: The mitral valve is grossly normal. Trivial mitral valve regurgitation. No evidence of mitral valve stenosis. Tricuspid Valve: The tricuspid valve is normal in structure. Tricuspid valve  regurgitation is mild . No evidence of tricuspid stenosis. Aortic Valve: The aortic valve is grossly normal. There is moderate calcification of the aortic valve. There is mild thickening of the aortic  valve. Aortic valve regurgitation is mild. Aortic valve sclerosis/calcification is present, without any evidence  of aortic stenosis. Aortic valve mean gradient measures 3.0 mmHg. Aortic valve peak gradient measures 5.7 mmHg. Aortic valve area, by VTI measures 3.02 cm. Pulmonic Valve: The pulmonic valve was grossly normal. Pulmonic valve regurgitation is mild. No evidence of pulmonic stenosis. Aorta: The aortic root is normal in size and structure. Venous: The inferior vena cava is normal in size with less than 50% respiratory variability, suggesting right atrial pressure of 8 mmHg. IAS/Shunts: No atrial level shunt detected by color flow Doppler.  LEFT VENTRICLE PLAX 2D LVIDd:         4.05 cm   Diastology LVIDs:         3.40 cm   LV e' medial:    5.33 cm/s LV PW:         1.20 cm   LV E/e' medial:  12.7 LV IVS:        1.20 cm   LV e' lateral:   13.40 cm/s LVOT diam:     1.96 cm   LV E/e' lateral: 5.0 LV SV:         71 LV SV Index:   41 LVOT Area:     3.02 cm  RIGHT VENTRICLE TAPSE (M-mode): 2.0 cm LEFT ATRIUM           Index        RIGHT ATRIUM           Index LA Vol (A4C): 52.3 ml 30.10 ml/m  RA Area:     13.40 cm                                    RA Volume:   29.00 ml  16.69 ml/m  AORTIC VALVE AV Area (Vmax):    2.66 cm AV Area (Vmean):   2.54 cm AV Area (VTI):     3.02 cm AV Vmax:           119.00 cm/s AV Vmean:          89.400 cm/s AV VTI:            0.235 m AV Peak Grad:      5.7 mmHg AV Mean Grad:      3.0 mmHg LVOT Vmax:         105.00 cm/s LVOT Vmean:        75.200 cm/s LVOT VTI:          0.235 m LVOT/AV VTI ratio: 1.00  AORTA Ao Root diam: 2.62 cm MITRAL VALVE MV Area (PHT): 2.87 cm    SHUNTS MV Decel Time: 264 msec    Systemic VTI:  0.24 m MV E velocity: 67.60 cm/s  Systemic Diam: 1.96 cm MV A  velocity: 57.60 cm/s MV E/A ratio:  1.17 Shelda Bruckner MD Electronically signed by Shelda Bruckner MD Signature Date/Time: 09/09/2024/1:16:31 PM    Final     Patient Profile     83 y.o. male  with a hx of Barrett's esophagus, prior CVA, osteoporosis, OSA on CPAP, pulmonary fibrosis, Parkinson's, HFmrEF, and CAD s/p DES and CABG who is being seen 09/09/2024 for the evaluation of A-fib with RVR at the request of Alejandro Marker DO.   Assessment & Plan    New onset atrial fibrillation with RVR -In review of prior EKGs it appeared that he was having some SVT during his  hospitalization in June for a mechanical fall with hip fracture.  At that time it looked like SVT but cannot rule out short bursts of atrial flutter. - Noted to go into atrial fibrillation with RVR yesterday but is now back in sinus rhythm on telemetry - CHA2DS2-VASc score is 7 which is very high - Unfortunately I think he is a very poor candidate for chronic anticoagulation given multiple falls over the past Liu months with significant resultant injuries with each of these falls.  He lives at home with his wife who has dementia and he has Parkinson's which also puts him at higher risk of falls - TSH 2.25; K+ 4.3; mag 1.9 - Suspect A-fib occurred due to stress of his acute injuries from his fall - Will get PT consult but I think the only way would be safe for him to be anticoagulated this if he was going to SNF where he could be watched closely. - Will ask Ortho if DOAC okay to start in setting of recent orthopedic injuries and if okay would start Eliquis 5 mg twice daily which has a lower bleeding profile than Eliquis but will only use in the interim until we decide if he is going to go to SNF and also get EP input - will discuss with EP whether he would be a Watchman candidate   Chronic HFmrEF Ischemic Cardiomyopathy - 2D echo showed EF 45 to 50% with global HK and mild AI - This is similar to EF earlier in the year when  he presented to [facility] ER with shortness of breath.  I ordered a PET stress CT when I saw him in April 2025 but unfortunately this never got pursued because patient then had a mechanical fall and fractured a hip and has been in and out of the hospital with falls since then - Chest x-ray showed low lung volumes with possible aspiration versus atelectasis but no edema - BNP minimally elevated at 273 - He peers euvolemic on exam today - Continue Toprol  XL 50 mg daily - BP 122/74 mmHg today - Continue Lasix  40 mg daily - Add losartan  12.5 mg daily with plans to transition to Entresto if BP tolerates - Consider addition of spironolactone and SGLT2i prior to discharge if BP will allow   ASCAD HLD - s/p inferior STEMI with DES x 2 to the ostial to mid RCA.   - Repeat cath in 2020 revealed severe 2 vessel CAD with 99% ostial LAD, 90% distal left main, 99% ostial left circumflex, 95% mid left circumflex, 60% proximal D1, 90% mid LAD, 60% ostial RCA and 25% ISR of the proximal to mid RCA.   -S/P CABG x 5 with LIMA to the LAD, SVG to diagonal, sequential SVG to OM 2/OM 3, SVG to RCA in 2020 - Still unclear etiology of decline in EF over the past year or but he has not had any anginal symptoms - Once he has recovered from his fall and orthopedic issues we will consider following through with outpatient stress PET CT to rule out ischemia - Continue Plavix  75 mg daily, atorvastatin  80 mg daily - No aspirin  since he may end up needing to be on DOAC  - Continue beta-blocker therapy - Lipids today: LDL 44, HDL 48, triglycerides 37   Hypertension - BP controlled at 122/74 mmHg - Continue Toprol  XL 50 mg daily - Add losartan  12.5 mg daily due to LV dysfunction   I spent 30 minutes caring for this patient today face  to face, ordering and reviewing labs, reviewing records from 2D echo this admission , seeing the patient, documenting in the record  For questions or updates, please contact [facility] Please consult [email or web address] for contact info under        Signed, Wilbert Bihari, MD  09/10/2024, 9:02 AM

## 2024-09-10 NOTE — TOC Progression Note (Signed)
 Transition of Care [facility]) - Progression Note    Patient Details  Name: MATTHE SLOANE MRN: [id number] Date of Birth: 12/31/1940  Transition [facility] CM/SW Contact  Bridget Cordella Simmonds, LCSW Phone Number: 09/10/2024, 1:51 PM  Clinical Narrative:   [facility] does offer bed.  CSW spoke with pt and he does want to accept. CSW confirmed with [facility].     Expected Discharge Plan: Skilled Nursing Facility Barriers to Discharge: Continued Medical Work up               Expected Discharge Plan and Services   Discharge Planning Services: CM Consult   Living arrangements for the past 2 months: Single Family Home                                       Social Drivers of Health (SDOH) Interventions SDOH Screenings   Food Insecurity: No Food Insecurity (09/06/2024)  Housing: Low Risk  (09/06/2024)  Transportation Needs: No Transportation Needs (09/06/2024)  Utilities: Not At Risk (09/06/2024)  Social Connections: Moderately Integrated (09/06/2024)  Tobacco Use: Low Risk  (03/24/2024)    Readmission Risk Interventions     No data to display

## 2024-09-10 NOTE — Plan of Care (Signed)

## 2024-09-11 ENCOUNTER — Telehealth (HOSPITAL_COMMUNITY): Payer: Self-pay

## 2024-09-11 ENCOUNTER — Other Ambulatory Visit (HOSPITAL_COMMUNITY): Payer: Self-pay

## 2024-09-11 DIAGNOSIS — I10 Essential (primary) hypertension: Secondary | ICD-10-CM | POA: Diagnosis not present

## 2024-09-11 DIAGNOSIS — E785 Hyperlipidemia, unspecified: Secondary | ICD-10-CM | POA: Diagnosis not present

## 2024-09-11 DIAGNOSIS — I4891 Unspecified atrial fibrillation: Secondary | ICD-10-CM | POA: Diagnosis not present

## 2024-09-11 DIAGNOSIS — I5022 Chronic systolic (congestive) heart failure: Secondary | ICD-10-CM | POA: Diagnosis not present

## 2024-09-11 LAB — CBC WITH DIFFERENTIAL/PLATELET
Abs Immature Granulocytes: 0.03 K/uL (ref 0.00–0.07)
Basophils Absolute: 0 K/uL (ref 0.0–0.1)
Basophils Relative: 0 %
Eosinophils Absolute: 0.1 K/uL (ref 0.0–0.5)
Eosinophils Relative: 1 %
HCT: 28.3 % — ABNORMAL LOW (ref 39.0–52.0)
Hemoglobin: 9.6 g/dL — ABNORMAL LOW (ref 13.0–17.0)
Immature Granulocytes: 0 %
Lymphocytes Relative: 20 %
Lymphs Abs: 1.3 K/uL (ref 0.7–4.0)
MCH: 34.7 pg — ABNORMAL HIGH (ref 26.0–34.0)
MCHC: 33.9 g/dL (ref 30.0–36.0)
MCV: 102.2 fL — ABNORMAL HIGH (ref 80.0–100.0)
Monocytes Absolute: 0.8 K/uL (ref 0.1–1.0)
Monocytes Relative: 11 %
Neutro Abs: 4.5 K/uL (ref 1.7–7.7)
Neutrophils Relative %: 68 %
Platelets: 140 K/uL — ABNORMAL LOW (ref 150–400)
RBC: 2.77 MIL/uL — ABNORMAL LOW (ref 4.22–5.81)
RDW: 14.1 % (ref 11.5–15.5)
WBC: 6.7 K/uL (ref 4.0–10.5)
nRBC: 0 % (ref 0.0–0.2)

## 2024-09-11 LAB — MAGNESIUM: Magnesium: 2 mg/dL (ref 1.7–2.4)

## 2024-09-11 LAB — COMPREHENSIVE METABOLIC PANEL WITH GFR
ALT: 21 U/L (ref 0–44)
AST: 19 U/L (ref 15–41)
Albumin: 2.8 g/dL — ABNORMAL LOW (ref 3.5–5.0)
Alkaline Phosphatase: 113 U/L (ref 38–126)
Anion gap: 7 (ref 5–15)
BUN: 46 mg/dL — ABNORMAL HIGH (ref 8–23)
CO2: 28 mmol/L (ref 22–32)
Calcium: 8.3 mg/dL — ABNORMAL LOW (ref 8.9–10.3)
Chloride: 101 mmol/L (ref 98–111)
Creatinine, Ser: 1.19 mg/dL (ref 0.61–1.24)
GFR, Estimated: >60 mL/min (ref >60)
Glucose, Bld: 138 mg/dL — ABNORMAL HIGH (ref 70–99)
Potassium: 4.1 mmol/L (ref 3.5–5.1)
Sodium: 136 mmol/L (ref 135–145)
Total Bilirubin: 1.3 mg/dL — ABNORMAL HIGH (ref 0.0–1.2)
Total Protein: 6.3 g/dL — ABNORMAL LOW (ref 6.5–8.1)

## 2024-09-11 LAB — IRON AND TIBC
Iron: 39 ug/dL — ABNORMAL LOW (ref 45–182)
Saturation Ratios: 17 % — ABNORMAL LOW (ref 17.9–39.5)
TIBC: 227 ug/dL — ABNORMAL LOW (ref 250–450)
UIBC: 188 ug/dL

## 2024-09-11 LAB — PHOSPHORUS: Phosphorus: 4.5 mg/dL (ref 2.5–4.6)

## 2024-09-11 LAB — FOLATE: Folate: >20 ng/mL (ref >5.9)

## 2024-09-11 LAB — FERRITIN: Ferritin: 211 ng/mL (ref 24–336)

## 2024-09-11 LAB — RETICULOCYTES
Immature Retic Fract: 25.3 % — ABNORMAL HIGH (ref 2.3–15.9)
RBC.: 2.73 MIL/uL — ABNORMAL LOW (ref 4.22–5.81)
Retic Count, Absolute: 72.6 K/uL (ref 19.0–186.0)
Retic Ct Pct: 2.7 % (ref 0.4–3.1)

## 2024-09-11 LAB — VITAMIN B12: Vitamin B-12: 342 pg/mL (ref 180–914)

## 2024-09-11 MED ORDER — LOSARTAN POTASSIUM 50 MG PO TABS: 25.0000 mg | ORAL_TABLET | Freq: Every day | ORAL | Status: DC

## 2024-09-11 MED ORDER — SACUBITRIL-VALSARTAN 24-26 MG PO TABS
1.0000 | ORAL_TABLET | Freq: Two times a day (BID) | ORAL | Status: DC
  Administered 2024-09-11 – 2024-09-12 (×3): 1 via ORAL
  Filled 2024-09-11 (×3): qty 1

## 2024-09-11 MED ORDER — OXYCODONE HCL 5 MG PO TABS
5.0000 mg | ORAL_TABLET | Freq: Once | ORAL | Status: AC
  Administered 2024-09-11: 5 mg via ORAL
  Filled 2024-09-11: qty 1

## 2024-09-11 MED ORDER — AMIODARONE HCL 200 MG PO TABS
200.0000 mg | ORAL_TABLET | Freq: Every day | ORAL | Status: DC
  Administered 2024-09-11 – 2024-09-12 (×2): 200 mg via ORAL
  Filled 2024-09-11 (×2): qty 1

## 2024-09-11 NOTE — Telephone Encounter (Signed)
 Pharmacy Patient Advocate Encounter  Insurance verification completed.    The patient is insured through [employer]. Patient has Medicare and is not eligible for a copay card, but may be able to apply for patient assistance or Medicare RX Payment Plan (Patient Must reach out to their plan, if eligible for payment plan), if available.    Ran test claim for Generic Entresto  24-26mg  and the current 30 day co-pay is $10.   This test claim was processed through [facility]- copay amounts may vary at other pharmacies due to [employer], or as the patient moves through the different stages of their insurance plan.

## 2024-09-11 NOTE — Progress Notes (Signed)
 Physical Therapy Treatment Patient Details Name: Matthew Liu MRN: [id number] DOB: May 23, 1941 Today's Date: 09/11/2024   History of Present Illness 83 y.o. male presenting 11/28 after fall in [employer] parking lot. Found to have R pubic rami fx. Plan for non-op management. PMH: CAD, CABG, IPF, OSA, CVA, PD with mild cognitive impairment, L hip fx 02/19/24.    PT Comments  Pt up in chair on arrival, agreeable to session and demonstrating continued progress towards acute goals. Pt requiring heavy min A to come to stand with assist to boost and steady once standing as pt with posterior bias and increased time needed to extend hips and knees. Pt demonstrating gait with RW for support for increased distance ~100' with up to min A to maintain balance. Pt with poor recall and adherence to cervical precautions throughout session despite cues and education. Pt up in chair at end of session with all needs met. Pt continues to benefit from skilled PT services to progress toward functional mobility goals.     If plan is discharge home, recommend the following: A little help with walking and/or transfers;A little help with bathing/dressing/bathroom;Assistance with cooking/housework;Assist for transportation;Help with stairs or ramp for entrance   Can travel by private vehicle     Yes  Equipment Recommendations  BSC/3in1    Recommendations for Other Services       Precautions / Restrictions Precautions Precautions: Fall Precaution Booklet Issued: Yes (comment) Precaution/Restrictions Comments: educated pt on cervical precautions per Dr. [place] request, but pt is not very compliant Restrictions Weight Bearing Restrictions Per Provider Order: Yes RLE Weight Bearing Per Provider Order: Weight bearing as tolerated     Mobility  Bed Mobility Overal bed mobility: Needs Assistance             General bed mobility comments: up in chair on arrival    Transfers Overall transfer level: Needs  assistance Equipment used: Rolling walker (2 wheels) Transfers: Sit to/from Stand Sit to Stand: Min assist, From elevated surface           General transfer comment: min A to boost to stand, good recall for hand placement, min A to gain balance once standing    Ambulation/Gait Ambulation/Gait assistance: Min assist Gait Distance (Feet): 100 Feet Assistive device: Rolling walker (2 wheels) Gait Pattern/deviations: Step-to pattern, Decreased stance time - right, Knees buckling, Decreased stride length, Shuffle, Antalgic, Trunk flexed, Decreased weight shift to right Gait velocity: reduced     General Gait Details: pt with progressive step length from beginning to end of ambulation, pt with increased bilat UE dependence during stance phase on R LE   Stairs             Wheelchair Mobility     Tilt Bed    Modified Rankin (Stroke Patients Only)       Balance Overall balance assessment: Needs assistance Sitting-balance support: Single extremity supported, Feet supported Sitting balance-Leahy Scale: Good Sitting balance - Comments: Pt able to maintain sitting balance on BSC   Standing balance support: Bilateral upper extremity supported, During functional activity, Reliant on assistive device for balance Standing balance-Leahy Scale: Poor Standing balance comment: Dependent on RW and external support                            Communication Communication Communication: Impaired Factors Affecting Communication: Hearing impaired;Other (comment) (requires mildly increased time for processing at times.)  Cognition Arousal: Alert Behavior During Therapy: [facility]  for tasks assessed/performed   PT - Cognitive impairments: Memory                       PT - Cognition Comments: pt with delayed response time Following commands: Intact      Cueing Cueing Techniques: Verbal cues, Gestural cues  Exercises      General Comments General comments (skin  integrity, edema, etc.): VSS on RA      Pertinent Vitals/Pain Pain Assessment Pain Assessment: Faces Faces Pain Scale: Hurts little more Pain Location: R side of pelvis with mobility Pain Descriptors / Indicators: Discomfort, Grimacing, Guarding, Moaning Pain Intervention(s): Monitored during session, Limited activity within patient's tolerance    Home Living                          Prior Function            PT Goals (current goals can now be found in the care plan section) Acute Rehab PT Goals Patient Stated Goal: to improve PT Goal Formulation: With patient Time For Goal Achievement: 09/22/24 Progress towards PT goals: Progressing toward goals    Frequency    Min 2X/week      PT Plan      Co-evaluation              AM-PAC PT 6 Clicks Mobility   Outcome Measure  Help needed turning from your back to your side while in a flat bed without using bedrails?: A Lot Help needed moving from lying on your back to sitting on the side of a flat bed without using bedrails?: A Lot Help needed moving to and from a bed to a chair (including a wheelchair)?: A Little Help needed standing up from a chair using your arms (e.g., wheelchair or bedside chair)?: A Little Help needed to walk in hospital room?: A Little Help needed climbing 3-5 steps with a railing? : Total 6 Click Score: 14    End of Session Equipment Utilized During Treatment: Gait belt Activity Tolerance: Patient tolerated treatment well Patient left: with call bell/phone within reach;in chair;with chair alarm set Nurse Communication: Mobility status PT Visit Diagnosis: Unsteadiness on feet (R26.81);Other abnormalities of gait and mobility (R26.89);Muscle weakness (generalized) (M62.81);History of falling (Z91.81);Repeated falls (R29.6);Difficulty in walking, not elsewhere classified (R26.2);Pain Pain - Right/Left: Right Pain - part of body:  (pelvis)     Time: 8545-8480 PT Time Calculation  (min) (ACUTE ONLY): 25 min  Charges:    $Gait Training: 23-37 mins PT General Charges $$ ACUTE PT VISIT: 1 Visit                     Joyce Heitman R. PTA Acute Rehabilitation Services Office: [phone]   Therisa CHRISTELLA Boor 09/11/2024, 3:48 PM

## 2024-09-11 NOTE — TOC Progression Note (Addendum)
 Transition of Care [facility]) - Progression Note    Patient Details  Name: Matthew Liu MRN: [id number] Date of Birth: [birth year]  [facility] CM/SW Contact  Bridget Cordella Simmonds, LCSW Phone Number: 09/11/2024, 10:38 AM  Clinical Narrative:   Message from [facility] [place]: no bed available until tomorrow.   SNF auth request submitted in [place] and approved: [id number], 5 days: 12/4-12/8.    Expected Discharge Plan: Skilled Nursing Facility Barriers to Discharge: Continued Medical Work up               Expected Discharge Plan and Services   Discharge Planning Services: CM Consult   Living arrangements for the past 2 months: Single Family Home                                       Social Drivers of Health (SDOH) Interventions SDOH Screenings   Food Insecurity: No Food Insecurity (09/06/2024)  Housing: Low Risk  (09/06/2024)  Transportation Needs: No Transportation Needs (09/06/2024)  Utilities: Not At Risk (09/06/2024)  Social Connections: Moderately Integrated (09/06/2024)  Tobacco Use: Low Risk  (03/24/2024)    Readmission Risk Interventions     No data to display

## 2024-09-11 NOTE — Progress Notes (Signed)
 Mobility Specialist Progress Note:   09/11/24 1018  Mobility  Activity Ambulated with assistance (In hallway)  Level of Assistance Minimal assist, patient does 75% or more  Assistive Device Front wheel walker  Distance Ambulated (ft) 110 ft  RLE Weight Bearing Per Provider Order WBAT  Activity Response Tolerated well  Mobility Referral Yes  Mobility visit 1 Mobility  Mobility Specialist Start Time (ACUTE ONLY) 1005  Mobility Specialist Stop Time (ACUTE ONLY) 1018  Mobility Specialist Time Calculation (min) (ACUTE ONLY) 13 min   Received pt in bed and hesitant but agreeable to mobility. Pt required MinA to EOB and STS, otherwise MinG. Pt c/o fatigue, otherwise tolerated well. Returned to room without fault. Left pt in bed with alarm on. Personal belongings and call light within reach. All needs met.  Lavanda Pollack Mobility Specialist  Please contact via [employer] or  Rehab Office [phone]

## 2024-09-11 NOTE — Progress Notes (Addendum)
 Progress Note  Patient Name: Matthew Liu Date of Encounter: 09/11/2024  [facility] Cardiologist: Wilbert Bihari, MD   Patient Profile     Subjective   Remains in normal sinus rhythm on telemetry with no A-fib.  Inpatient Medications    Scheduled Meds:  atorvastatin   80 mg Oral QPM   clopidogrel   75 mg Oral Daily   feeding supplement  237 mL Oral BID BM   furosemide   40 mg Oral Daily   heparin   5,000 Units Subcutaneous Q12H   losartan   12.5 mg Oral Daily   metoprolol  succinate  50 mg Oral Daily   pantoprazole   40 mg Oral Daily   sodium chloride  flush  3 mL Intravenous Q12H   Continuous Infusions:  PRN Meds: acetaminophen  **OR** acetaminophen , hydrALAZINE , ondansetron  (ZOFRAN ) IV, oxyCODONE    Vital Signs    Vitals:   09/10/24 1505 09/10/24 2021 09/11/24 0309 09/11/24 0525  BP: (!) 165/65 (!) 119/54 (!) 126/55 (!) 147/57  Pulse: 81 87 80 70  Resp:  16 16   Temp: 98.7 F (37.1 C) 98.2 F (36.8 C) 98.3 F (36.8 C)   TempSrc: Oral     SpO2: 95% 96% 96%   Weight:      Height:        Intake/Output Summary (Last 24 hours) at 09/11/2024 0802 Last data filed at 09/10/2024 1827 Gross per 24 hour  Intake 360 ml  Output 375 ml  Net -15 ml      09/10/2024    4:00 AM 09/07/2024    5:00 AM 09/06/2024    1:30 PM  Last 3 Weights  Weight (lbs) 147 lb 7.8 oz 139 lb 15.9 oz 140 lb  Weight (kg) 66.9 kg 63.5 kg 63.504 kg      Telemetry    Sinus rhythm- Personally Reviewed  ECG    No new EKG to review- Personally Reviewed  Physical Exam   GEN: Well nourished, well developed in no acute distress HEENT: Normal NECK: No JVD; No carotid bruits LYMPHATICS: No lymphadenopathy CARDIAC:RRR, no murmurs, rubs, gallops RESPIRATORY:  Clear to auscultation without rales, wheezing or rhonchi  ABDOMEN: Soft, non-tender, non-distended MUSCULOSKELETAL:  No edema; No deformity  SKIN: Warm and dry NEUROLOGIC:  Alert and oriented x 3 PSYCHIATRIC:  Normal affect  Labs     High Sensitivity Troponin:   Recent Labs  Lab 09/08/24 1947  TROPONINIHS 13      Chemistry Recent Labs  Lab 09/09/24 1041 09/10/24 1715 09/11/24 0534  NA 136 134* 136  K 4.3 4.1 4.1  CL 103 99 101  CO2 28 28 28   GLUCOSE 120* 161* 138*  BUN 33* 43* 46*  CREATININE 1.15 1.42* 1.19  CALCIUM  8.2* 8.1* 8.3*  PROT 5.9* 6.5 6.3*  ALBUMIN  2.8* 3.0* 2.8*  AST 30 25 19   ALT 23 23 21   ALKPHOS 119 128* 113  BILITOT 1.0 1.0 1.3*  GFRNONAA >60 49* >60  ANIONGAP 5 7 7      Hematology Recent Labs  Lab 09/09/24 1041 09/10/24 1715 09/11/24 0534  WBC 5.6 7.2 6.7  RBC 2.67* 2.84* 2.77*  2.73*  HGB 9.5* 10.0* 9.6*  HCT 27.9* 29.3* 28.3*  MCV 104.5* 103.2* 102.2*  MCH 35.6* 35.2* 34.7*  MCHC 34.1 34.1 33.9  RDW 14.4 14.2 14.1  PLT 122* 135* 140*    BNP Recent Labs  Lab 09/10/24 0353  BNP 273.5*     DDimer No results for input(s): DDIMER in the last 168 hours.  CHA2DS2-VASc Score = 7   This indicates a 11.2% annual risk of stroke. The patient's score is based upon: CHF History: 1 HTN History: 1 Diabetes History: 0 Stroke History: 2 Vascular Disease History: 1 Age Score: 2 Gender Score: 0      Radiology    ECHOCARDIOGRAM COMPLETE Result Date: 09/09/2024    ECHOCARDIOGRAM REPORT   Patient Name:   Matthew Liu Date of Exam: 09/09/2024 Medical Rec #:  [id number]     Height:       67.0 in Accession #:    [id number]    Weight:       140.0 lb Date of Birth:  1941-04-21     BSA:          1.738 m Patient Age:    83 years      BP:           123/54 mmHg Patient Gender: M             HR:           78 bpm. Exam Location:  Inpatient Procedure: 2D Echo, Cardiac Doppler and Color Doppler (Both Spectral and Color            Flow Doppler were utilized during procedure). Indications:    Atrial Fibrillation I48.91  History:        Patient has prior history of Echocardiogram examinations, most                 recent 12/11/2018. CAD and Previous Myocardial Infarction; Risk                  Factors:Hypertension and Sleep Apnea.  Sonographer:    Jayson Gaskins Referring Phys: [id number] NATALIE M DONATI-GARMON IMPRESSIONS  1. Left ventricular ejection fraction, by estimation, is 45 to 50%. The left ventricle has mildly decreased function. The left ventricle demonstrates global hypokinesis. There is mild left ventricular hypertrophy. Left ventricular diastolic parameters are indeterminate.  2. Right ventricular systolic function is normal. The right ventricular size is normal.  3. Left atrial size was mildly dilated.  4. The mitral valve is grossly normal. Trivial mitral valve regurgitation. No evidence of mitral stenosis.  5. The aortic valve is grossly normal. There is moderate calcification of the aortic valve. There is mild thickening of the aortic valve. Aortic valve regurgitation is mild. Aortic valve sclerosis/calcification is present, without any evidence of aortic  stenosis.  6. The inferior vena cava is normal in size with <50% respiratory variability, suggesting right atrial pressure of 8 mmHg. Comparison(s): Prior images unable to be directly viewed, comparison made by report only. Most recent echo 01/19/24 from [facility] reviewed: EF 40-45%, global hypokinesis. FINDINGS  Left Ventricle: Left ventricular ejection fraction, by estimation, is 45 to 50%. The left ventricle has mildly decreased function. The left ventricle demonstrates global hypokinesis. The left ventricular internal cavity size was normal in size. There is  mild left ventricular hypertrophy. Abnormal (paradoxical) septal motion consistent with post-operative status. Left ventricular diastolic parameters are indeterminate. Right Ventricle: The right ventricular size is normal. No increase in right ventricular wall thickness. Right ventricular systolic function is normal. Left Atrium: Left atrial size was mildly dilated. Right Atrium: Right atrial size was normal in size. Pericardium: There is no evidence of pericardial  effusion. Mitral Valve: The mitral valve is grossly normal. Trivial mitral valve regurgitation. No evidence of mitral valve stenosis. Tricuspid Valve: The tricuspid valve is normal in structure. Tricuspid valve regurgitation is mild .  No evidence of tricuspid stenosis. Aortic Valve: The aortic valve is grossly normal. There is moderate calcification of the aortic valve. There is mild thickening of the aortic valve. Aortic valve regurgitation is mild. Aortic valve sclerosis/calcification is present, without any evidence  of aortic stenosis. Aortic valve mean gradient measures 3.0 mmHg. Aortic valve peak gradient measures 5.7 mmHg. Aortic valve area, by VTI measures 3.02 cm. Pulmonic Valve: The pulmonic valve was grossly normal. Pulmonic valve regurgitation is mild. No evidence of pulmonic stenosis. Aorta: The aortic root is normal in size and structure. Venous: The inferior vena cava is normal in size with less than 50% respiratory variability, suggesting right atrial pressure of 8 mmHg. IAS/Shunts: No atrial level shunt detected by color flow Doppler.  LEFT VENTRICLE PLAX 2D LVIDd:         4.05 cm   Diastology LVIDs:         3.40 cm   LV e' medial:    5.33 cm/s LV PW:         1.20 cm   LV E/e' medial:  12.7 LV IVS:        1.20 cm   LV e' lateral:   13.40 cm/s LVOT diam:     1.96 cm   LV E/e' lateral: 5.0 LV SV:         71 LV SV Index:   41 LVOT Area:     3.02 cm  RIGHT VENTRICLE TAPSE (M-mode): 2.0 cm LEFT ATRIUM           Index        RIGHT ATRIUM           Index LA Vol (A4C): 52.3 ml 30.10 ml/m  RA Area:     13.40 cm                                    RA Volume:   29.00 ml  16.69 ml/m  AORTIC VALVE AV Area (Vmax):    2.66 cm AV Area (Vmean):   2.54 cm AV Area (VTI):     3.02 cm AV Vmax:           119.00 cm/s AV Vmean:          89.400 cm/s AV VTI:            0.235 m AV Peak Grad:      5.7 mmHg AV Mean Grad:      3.0 mmHg LVOT Vmax:         105.00 cm/s LVOT Vmean:        75.200 cm/s LVOT VTI:          0.235  m LVOT/AV VTI ratio: 1.00  AORTA Ao Root diam: 2.62 cm MITRAL VALVE MV Area (PHT): 2.87 cm    SHUNTS MV Decel Time: 264 msec    Systemic VTI:  0.24 m MV E velocity: 67.60 cm/s  Systemic Diam: 1.96 cm MV A velocity: 57.60 cm/s MV E/A ratio:  1.17 Shelda Bruckner MD Electronically signed by Shelda Bruckner MD Signature Date/Time: 09/09/2024/1:16:31 PM    Final     Patient Profile     83 y.o. male  with a hx of Barrett's esophagus, prior CVA, osteoporosis, OSA on CPAP, pulmonary fibrosis, Parkinson's, HFmrEF, and CAD s/p DES and CABG who is being seen 09/09/2024 for the evaluation of A-fib with RVR at the request of Alejandro Marker DO.  Assessment & Plan    New onset atrial fibrillation with RVR -In review of prior EKGs it appeared that he was having some SVT during his hospitalization in June for a mechanical fall with hip fracture.  At that time it looked like SVT but cannot rule out short bursts of atrial flutter. - Noted to go into atrial fibrillation with RVR yesterday but is now back in sinus rhythm on telemetry - CHA2DS2-VASc score is 7 which is very high - Unfortunately I think he is a very poor candidate for chronic anticoagulation given multiple falls over the past few months with significant resultant injuries with each of these falls.  He lives at home with his wife who has dementia and he has Parkinson's which also puts him at higher risk of falls - Suspect A-fib occurred due to stress of his acute injuries from his fall - Discussed with EP yesterday and given his high has bled score he is at significant risk for bleeding complications on DOAC therapy.  In order to get a watchman he would have to be able to be on DOAC for at least 4 to 5 months and given his multiple falls with serious injuries over the past 6 months the risks of any anticoagulation outweigh the benefits. - Do not recommend starting anticoagulation therapy based on risks as stated above.  This was discussed with  his daughter - Will start on low-dose amiodarone 200 mg daily to try to suppress any further A-fib  Chronic HFmrEF Ischemic Cardiomyopathy - 2D echo showed EF 45 to 50% with global HK and mild AI - This is similar to EF earlier in the year when he presented to [facility] ER with shortness of breath.  I ordered a PET stress CT when I saw him in April 2025 but unfortunately this never got pursued because patient then had a mechanical fall and fractured a hip and has been in and out of the hospital with falls since then - Chest x-ray showed low lung volumes with possible aspiration versus atelectasis but no edema - BNP minimally elevated at 273 - He appears euvolemic on exam today - Continue Toprol  XL 50 mg daily  - Continue Lasix  40 mg daily - Transition losartan  to Entresto 24-26 mg twice daily - Consider addition of spironolactone and SGLT2i >> this can be done outpatient   ASCAD HLD - s/p inferior STEMI with DES x 2 to the ostial to mid RCA.   - Repeat cath in 2020 revealed severe 2 vessel CAD with 99% ostial LAD, 90% distal left main, 99% ostial left circumflex, 95% mid left circumflex, 60% proximal D1, 90% mid LAD, 60% ostial RCA and 25% ISR of the proximal to mid RCA.   -S/P CABG x 5 with LIMA to the LAD, SVG to diagonal, sequential SVG to OM 2/OM 3, SVG to RCA in 2020 - Still unclear etiology of decline in EF over the past year  - Once he has recovered from his fall and orthopedic issues we will consider following through with outpatient stress PET CT to rule out ischemia - Denies any chest pain or shortness of breath  - Continue Plavix  75 mg daily, atorvastatin  80 mg daily, Toprol -XL 50 mg daily  - Lipids today: LDL 44, HDL 48, triglycerides 37   Hypertension - BP mildly elevated 147/57 mmHg - Continue Toprol  XL 50 mg daily - Transitioning losartan  to Entresto 24-26 mg twice daily   I spent 30 minutes caring for this patient today face  to face, ordering and reviewing labs,  reviewing records from 2D echo this admission , seeing the patient, documenting in the record  For questions or updates, please contact [facility] Please consult [email or web address] for contact info under        Signed, Wilbert Bihari, MD  09/11/2024, 8:02 AM

## 2024-09-11 NOTE — Progress Notes (Signed)
 PROGRESS NOTE    Matthew Liu  FMW:[id number] DOB: 1941-03-31 DOA: 09/06/2024 PCP: Keren Vicenta BRAVO, MD   Brief Narrative: Matthew Liu is a 83 y.o. male with a history of CAD s/p CABG, IPF, OSA, CVA, parkinson disease, cognitive impairment, left hip fracture.  Patient presented secondary to secondary to a fall and found to have a C7 wedge compression fracture, right-sided pubic rami fracture in addition to newly diagnosed atrial fibrillation with RVR. Non-operative management for fractures. Cardiology consulted for management. Patient converted out of atrial fibrillation during admission.   Assessment and Plan:  Newly diagnosed atrial fibrillation with RVR Newly diagnosed this admission.  Cardiology was consulted and patient managed on metoprolol .  Transthoracic Echocardiogram with mild left atrial dilation. Patient converted to normal sinus rhythm.  Patient is a poor candidate for anticoagulation and initiation has been deferred.  Cardiology discussed with electrophysiology with question of possible Watchman, however this was deferred as well.  Amiodarone 200 mg started on 12/3. -Cardiology recommendations: Low-dose amiodarone 20 mg daily -Continue metoprolol   C7 wedge compression fracture Imaging reviewed by neurosurgery with evidence of healed fracture compared to imaging in May. Recommendation for no further workup or treatment.  Right pubic rami fracture Patient evaluated by orthopedic surgery  Chronic systolic heart failure Ischemic cardiomyopathy No evidence of acute heart failure. LVEF of 45-50% this admission. Patient started on metoprolol  succinate 50 mg daily and Entresto 24-26. -Continue metoprolol  and losartan  per cardiology recommendations -Continue Lasix  -Cardiology recommending consideration of starting spironolactone and an SGLT2 inhibitor as an outpatient  PAD Noted. -Continue Plavix  and Lipitor   CAD Patient with a history of an inferior STEMI with DES x  2 and in addition to history of CABG x 5. -Continue Plavix  and Lipitor  -Continue metoprolol  succinate  Hyperlipidemia -Continue Lipitor   Primary hypertension -Continue Entresto and Toprol  XL  Macrocytic anemia Normal folate and vitamin B12. Stable.  Thrombocytopenia Acute drop, likely reactive. Platelets down to a low of 109,000. Improved.  Parkinson disease Mild cognitive impairment Noted. Not on medication management. Outpatient neurology follow-up.  GERD History of Barrett's esophagus -Continue Protonix   OSA -Continue CPAP at bedtime  Hypoalbuminemia Noted.   DVT prophylaxis: Subcutaneous heparin  Code Status:   Code Status: Full Code Family Communication: None at bedside Disposition Plan: Discharge to SNF once bed is available   Consultants:  Orthopedic surgery Cardiology Neurosurgery  Procedures:  Transthoracic Echocardiogram  Antimicrobials: None    Subjective: Patient reports no chest pain or dyspnea.  Objective: BP (!) 130/56 (BP Location: Left Arm)   Pulse 70   Temp (!) 97.5 F (36.4 C)   Resp 20   Ht 5' 7 (1.702 m)   Wt 66.9 kg   SpO2 98%   BMI 23.10 kg/m   Examination:  General exam: Appears calm and comfortable. Respiratory system: Clear to auscultation. Respiratory effort normal. Cardiovascular system: S1 & S2 heard, irregular rhythm. Normal rate. Gastrointestinal system: Abdomen is nondistended, soft and nontender. Normal bowel sounds heard. Central nervous system: Alert and oriented. No focal neurological deficits. Musculoskeletal: No edema. No calf tenderness   Data Reviewed: I have personally reviewed following labs and imaging studies  CBC Lab Results  Component Value Date   WBC 6.7 09/11/2024   RBC 2.77 (L) 09/11/2024   RBC 2.73 (L) 09/11/2024   HGB 9.6 (L) 09/11/2024   HCT 28.3 (L) 09/11/2024   MCV 102.2 (H) 09/11/2024   MCH 34.7 (H) 09/11/2024   PLT 140 (L) 09/11/2024   MCHC 33.9  09/11/2024   RDW 14.1  09/11/2024   LYMPHSABS 1.3 09/11/2024   MONOABS 0.8 09/11/2024   EOSABS 0.1 09/11/2024   BASOSABS 0.0 09/11/2024     Last metabolic panel Lab Results  Component Value Date   NA 136 09/11/2024   K 4.1 09/11/2024   CL 101 09/11/2024   CO2 28 09/11/2024   BUN 46 (H) 09/11/2024   CREATININE 1.19 09/11/2024   GLUCOSE 138 (H) 09/11/2024   GFRNONAA >60 09/11/2024   GFRAA 81 09/02/2020   CALCIUM  8.3 (L) 09/11/2024   PHOS 4.5 09/11/2024   PROT 6.3 (L) 09/11/2024   ALBUMIN  2.8 (L) 09/11/2024   LABGLOB 2.3 03/20/2017   AGRATIO 1.9 03/20/2017   BILITOT 1.3 (H) 09/11/2024   ALKPHOS 113 09/11/2024   AST 19 09/11/2024   ALT 21 09/11/2024   ANIONGAP 7 09/11/2024    GFR: Estimated Creatinine Clearance: 43.2 mL/min (by C-G formula based on SCr of 1.19 mg/dL).  Recent Results (from the past 240 hours)  Surgical PCR screen     Status: None   Collection Time: 09/06/24 11:22 PM   Specimen: Nasal Mucosa; Nasal Swab  Result Value Ref Range Status   MRSA, PCR NEGATIVE NEGATIVE Final   Staphylococcus aureus NEGATIVE NEGATIVE Final    Comment: (NOTE) The Xpert SA Assay (FDA approved for NASAL specimens in patients 53 years of age and older), is one component of a comprehensive surveillance program. It is not intended to diagnose infection nor to guide or monitor treatment. Performed at [facility], [address] [place], [place] [address]       Radiology Studies: ECHOCARDIOGRAM COMPLETE Result Date: 09/09/2024    ECHOCARDIOGRAM REPORT   Patient Name:   Matthew Liu Date of Exam: 09/09/2024 Medical Rec #:  [id number]     Height:       67.0 in Accession #:    [id number]    Weight:       140.0 lb Date of Birth:  1941/09/23     BSA:          1.738 m Patient Age:    83 years      BP:           123/54 mmHg Patient Gender: M             HR:           78 bpm. Exam Location:  Inpatient Procedure: 2D Echo, Cardiac Doppler and Color Doppler (Both Spectral and Color            Flow Doppler  were utilized during procedure). Indications:    Atrial Fibrillation I48.91  History:        Patient has prior history of Echocardiogram examinations, most                 recent 12/11/2018. CAD and Previous Myocardial Infarction; Risk                 Factors:Hypertension and Sleep Apnea.  Sonographer:    Jayson Gaskins Referring Phys: [id number] NATALIE M DONATI-GARMON IMPRESSIONS  1. Left ventricular ejection fraction, by estimation, is 45 to 50%. The left ventricle has mildly decreased function. The left ventricle demonstrates global hypokinesis. There is mild left ventricular hypertrophy. Left ventricular diastolic parameters are indeterminate.  2. Right ventricular systolic function is normal. The right ventricular size is normal.  3. Left atrial size was mildly dilated.  4. The mitral valve is grossly normal. Trivial mitral valve regurgitation.  No evidence of mitral stenosis.  5. The aortic valve is grossly normal. There is moderate calcification of the aortic valve. There is mild thickening of the aortic valve. Aortic valve regurgitation is mild. Aortic valve sclerosis/calcification is present, without any evidence of aortic  stenosis.  6. The inferior vena cava is normal in size with <50% respiratory variability, suggesting right atrial pressure of 8 mmHg. Comparison(s): Prior images unable to be directly viewed, comparison made by report only. Most recent echo 01/19/24 from [facility] reviewed: EF 40-45%, global hypokinesis. FINDINGS  Left Ventricle: Left ventricular ejection fraction, by estimation, is 45 to 50%. The left ventricle has mildly decreased function. The left ventricle demonstrates global hypokinesis. The left ventricular internal cavity size was normal in size. There is  mild left ventricular hypertrophy. Abnormal (paradoxical) septal motion consistent with post-operative status. Left ventricular diastolic parameters are indeterminate. Right Ventricle: The right ventricular size is normal. No  increase in right ventricular wall thickness. Right ventricular systolic function is normal. Left Atrium: Left atrial size was mildly dilated. Right Atrium: Right atrial size was normal in size. Pericardium: There is no evidence of pericardial effusion. Mitral Valve: The mitral valve is grossly normal. Trivial mitral valve regurgitation. No evidence of mitral valve stenosis. Tricuspid Valve: The tricuspid valve is normal in structure. Tricuspid valve regurgitation is mild . No evidence of tricuspid stenosis. Aortic Valve: The aortic valve is grossly normal. There is moderate calcification of the aortic valve. There is mild thickening of the aortic valve. Aortic valve regurgitation is mild. Aortic valve sclerosis/calcification is present, without any evidence  of aortic stenosis. Aortic valve mean gradient measures 3.0 mmHg. Aortic valve peak gradient measures 5.7 mmHg. Aortic valve area, by VTI measures 3.02 cm. Pulmonic Valve: The pulmonic valve was grossly normal. Pulmonic valve regurgitation is mild. No evidence of pulmonic stenosis. Aorta: The aortic root is normal in size and structure. Venous: The inferior vena cava is normal in size with less than 50% respiratory variability, suggesting right atrial pressure of 8 mmHg. IAS/Shunts: No atrial level shunt detected by color flow Doppler.  LEFT VENTRICLE PLAX 2D LVIDd:         4.05 cm   Diastology LVIDs:         3.40 cm   LV e' medial:    5.33 cm/s LV PW:         1.20 cm   LV E/e' medial:  12.7 LV IVS:        1.20 cm   LV e' lateral:   13.40 cm/s LVOT diam:     1.96 cm   LV E/e' lateral: 5.0 LV SV:         71 LV SV Index:   41 LVOT Area:     3.02 cm  RIGHT VENTRICLE TAPSE (M-mode): 2.0 cm LEFT ATRIUM           Index        RIGHT ATRIUM           Index LA Vol (A4C): 52.3 ml 30.10 ml/m  RA Area:     13.40 cm                                    RA Volume:   29.00 ml  16.69 ml/m  AORTIC VALVE AV Area (Vmax):    2.66 cm AV Area (Vmean):   2.54 cm AV Area (VTI):  3.02 cm AV Vmax:           119.00 cm/s AV Vmean:          89.400 cm/s AV VTI:            0.235 m AV Peak Grad:      5.7 mmHg AV Mean Grad:      3.0 mmHg LVOT Vmax:         105.00 cm/s LVOT Vmean:        75.200 cm/s LVOT VTI:          0.235 m LVOT/AV VTI ratio: 1.00  AORTA Ao Root diam: 2.62 cm MITRAL VALVE MV Area (PHT): 2.87 cm    SHUNTS MV Decel Time: 264 msec    Systemic VTI:  0.24 m MV E velocity: 67.60 cm/s  Systemic Diam: 1.96 cm MV A velocity: 57.60 cm/s MV E/A ratio:  1.17 Shelda Bruckner MD Electronically signed by Shelda Bruckner MD Signature Date/Time: 09/09/2024/1:16:31 PM    Final       LOS: 2 days    Elgin Lam, MD [facility] 09/11/2024, 9:29 AM   If 7PM-7AM, please contact night-coverage [email or web address]

## 2024-09-12 DIAGNOSIS — I10 Essential (primary) hypertension: Secondary | ICD-10-CM | POA: Diagnosis not present

## 2024-09-12 DIAGNOSIS — I4891 Unspecified atrial fibrillation: Secondary | ICD-10-CM | POA: Diagnosis not present

## 2024-09-12 DIAGNOSIS — E785 Hyperlipidemia, unspecified: Secondary | ICD-10-CM | POA: Diagnosis not present

## 2024-09-12 DIAGNOSIS — I480 Paroxysmal atrial fibrillation: Secondary | ICD-10-CM

## 2024-09-12 DIAGNOSIS — I5022 Chronic systolic (congestive) heart failure: Secondary | ICD-10-CM | POA: Diagnosis not present

## 2024-09-12 MED ORDER — AMIODARONE HCL 200 MG PO TABS: 200.0000 mg | ORAL_TABLET | Freq: Every day | ORAL | Status: AC

## 2024-09-12 MED ORDER — SACUBITRIL-VALSARTAN 24-26 MG PO TABS: 1.0000 | ORAL_TABLET | Freq: Two times a day (BID) | ORAL | Status: AC

## 2024-09-12 MED ORDER — METOPROLOL SUCCINATE ER 50 MG PO TB24: 50.0000 mg | ORAL_TABLET | Freq: Every day | ORAL | Status: AC

## 2024-09-12 MED ORDER — OXYCODONE HCL 5 MG PO TABS: 5.0000 mg | ORAL_TABLET | ORAL | 0 refills | Status: AC | PRN

## 2024-09-12 NOTE — Care Management Important Message (Signed)
 Important Message  Patient Details  Name: Matthew Liu MRN: [id number] Date of Birth: 1941/10/06   Important Message Given:  Yes - Medicare IM     Jennie Laneta Dragon 09/12/2024, 11:12 AM

## 2024-09-12 NOTE — Progress Notes (Signed)
 Report called to [facility]. AVS packet in discharge folder. IV taken out. All personal belongings are with patient at bedside.

## 2024-09-12 NOTE — Discharge Summary (Addendum)
 Physician Discharge Summary   Patient: Matthew Liu MRN: [id number] DOB: 03/18/1941  Admit date:     09/06/2024  Discharge date: 09/12/24  Discharge Physician: Elgin Lam, MD   PCP: Keren Vicenta BRAVO, MD   Recommendations at discharge:  PCP visit for hospital follow-up Cardiology visit for hospital follow-up  Discharge Diagnoses: Principal Problem:   Falls, initial encounter Active Problems:   Primary hypertension   Atrial fibrillation with RVR (HCC)   Chronic systolic CHF (congestive heart failure) (HCC)   Ischemic cardiomyopathy   Pure hypercholesterolemia   PAF (paroxysmal atrial fibrillation) (HCC)  Resolved Problems:   * No resolved hospital problems. *  Hospital Course: Matthew Liu is a 83 y.o. male with a history of CAD s/p CABG, IPF, OSA, CVA, parkinson disease, cognitive impairment, left hip fracture.  Patient presented secondary to secondary to a fall and found to have a C7 wedge compression fracture (assessed as non-acute by neurosurgery), right-sided pubic rami fracture in addition to newly diagnosed atrial fibrillation with RVR. Non-operative management for fractures. Cardiology consulted for management. Patient converted out of atrial fibrillation during admission.  Assessment and Plan:  Newly diagnosed atrial fibrillation with RVR Newly diagnosed this admission.  Cardiology was consulted and patient managed on metoprolol .  Transthoracic Echocardiogram with mild left atrial dilation. Patient converted to normal sinus rhythm.  Patient is a poor candidate for anticoagulation and initiation has been deferred.  Cardiology discussed with electrophysiology with question of possible Watchman, however this was deferred as well.  Amiodarone  200 mg started on 12/3. Patient to discharge on metoprolol  and amiodarone . Cardiology recommendation for outpatient follow-up.   C7 wedge compression fracture Imaging reviewed by neurosurgery with evidence of healed fracture compared  to imaging in May. Recommendation for no further workup or treatment.   Right pubic rami fracture Patient evaluated by orthopedic surgery   Chronic systolic heart failure Ischemic cardiomyopathy No evidence of acute heart failure. LVEF of 45-50% this admission. Patient started on metoprolol  succinate 50 mg daily and Entresto  24-26 and will continue on discharge.  Continue Lasix .   PAD Noted. Continue Plavix  and Lipitor    CAD Patient with a history of an inferior STEMI with DES x 2 and in addition to history of CABG x 5. Continue  home Plavix  and Lipitor . Continue metoprolol  succinate on discharge.   Hyperlipidemia Continue Lipitor    Primary hypertension Continue Entresto  and Toprol  XL   Macrocytic anemia Normal folate and vitamin B12. Stable.   Thrombocytopenia Acute drop, likely reactive. Platelets down to a low of 109,000. Improved.   Parkinson disease Mild cognitive impairment Noted. Not on medication management. Outpatient neurology follow-up.   GERD History of Barrett's esophagus Continue Protonix    OSA Continue CPAP at bedtime   Hypoalbuminemia Noted.   Consultants:  Orthopedic surgery Cardiology Neurosurgery   Procedures:  Transthoracic Echocardiogram  Disposition: Skilled nursing facility  Diet recommendation: Cardiac diet   DISCHARGE MEDICATION: Allergies as of 09/12/2024       Reactions   Aspirin  Anaphylaxis, Shortness Of Breath   Sulfa Antibiotics Hives, Shortness Of Breath   Quinine Derivatives Hives, Other (See Comments)   looks like he has the measles        Medication List     STOP taking these medications    carvedilol  3.125 MG tablet Commonly known as: Coreg    losartan  25 MG tablet Commonly known as: COZAAR    meloxicam 15 MG tablet Commonly known as: MOBIC       TAKE these medications  acetaminophen  500 MG tablet Commonly known as: TYLENOL  Take 2 tablets (1,000 mg total) by mouth every 8 (eight) hours as  needed for mild pain (pain score 1-3).   amiodarone 200 MG tablet Commonly known as: PACERONE Take 1 tablet (200 mg total) by mouth daily. Start taking on: September 13, 2024   atorvastatin  80 MG tablet Commonly known as: LIPITOR  Take 1 tablet (80 mg total) by mouth every evening.   clopidogrel  75 MG tablet Commonly known as: PLAVIX  Take 1 tablet (75 mg total) by mouth daily.   cyanocobalamin  100 MCG tablet Commonly known as: VITAMIN B12 Take 100 mcg by mouth daily.   docusate sodium  100 MG capsule Commonly known as: COLACE Take 100 mg by mouth daily as needed for mild constipation.   feeding supplement Liqd Take 237 mLs by mouth 2 (two) times daily between meals. What changed: when to take this   furosemide  40 MG tablet Commonly known as: LASIX  Take 1 tablet (40 mg total) by mouth daily.   metoprolol  succinate 50 MG 24 hr tablet Commonly known as: TOPROL -XL Take 1 tablet (50 mg total) by mouth daily. Take with or immediately following a meal. Start taking on: September 13, 2024   ondansetron  4 MG tablet Commonly known as: ZOFRAN  Take 4 mg by mouth every 6 (six) hours as needed.   One-A-Day Mens 50+ Tabs Take 1 tablet by mouth daily with breakfast.   oxyCODONE  5 MG immediate release tablet Commonly known as: Oxy IR/ROXICODONE  Take 1 tablet (5 mg total) by mouth every 4 (four) hours as needed for moderate pain (pain score 4-6) or severe pain (pain score 7-10).   pantoprazole  40 MG tablet Commonly known as: PROTONIX  Take 1 tablet (40 mg total) by mouth daily.   polyethylene glycol 17 g packet Commonly known as: MIRALAX  / GLYCOLAX  Take 17 g by mouth daily. What changed:  when to take this reasons to take this   sacubitril-valsartan 24-26 MG Commonly known as: ENTRESTO Take 1 tablet by mouth 2 (two) times daily.   Vitamin D3 50 MCG (2000 UT) capsule Take 2,000 Units by mouth daily.        Follow-up Information     Keren Vicenta BRAVO, MD. Schedule an  appointment as soon as possible for a visit.   Specialty: Internal Medicine Why: For hospital follow-up Contact information: [address] [place] [address] [phone]         Shlomo Wilbert SAUNDERS, MD. Schedule an appointment as soon as possible for a visit in 2 week(s).   Specialty: Cardiology Why: For hospital follow-up Contact information: [address] [place] [address] [phone]                Discharge Exam: BP (!) 107/46 (BP Location: Left Arm)   Pulse 80   Temp 98.4 F (36.9 C)   Resp 18   Ht 5' 7 (1.702 m)   Wt 64.8 kg   SpO2 98%   BMI 22.37 kg/m   General exam: Appears calm and comfortable. Respiratory system: Clear to auscultation. Respiratory effort normal. Cardiovascular system: S1 & S2 heard, irregular rhythm, normal rate. Gastrointestinal system: Abdomen is nondistended, soft and nontender. Normal bowel sounds heard. Central nervous system: Alert.   Condition at discharge: stable  The results of significant diagnostics from this hospitalization (including imaging, microbiology, ancillary and laboratory) are listed below for reference.   Imaging Studies: ECHOCARDIOGRAM COMPLETE Result Date: 09/09/2024    ECHOCARDIOGRAM REPORT   Patient  Name:   Prabhav O Kotch Date of Exam: 09/09/2024 Medical Rec #:  [id number]     Height:       67.0 in Accession #:    [id number]    Weight:       140.0 lb Date of Birth:  [birth year]     BSA:          1.738 m Patient Age:    83 years      BP:           123/54 mmHg Patient Gender: M             HR:           78 bpm. Exam Location:  Inpatient Procedure: 2D Echo, Cardiac Doppler and Color Doppler (Both Spectral and Color            Flow Doppler were utilized during procedure). Indications:    Atrial Fibrillation I48.91  History:        Patient has prior history of Echocardiogram examinations, most                 recent 12/11/2018. CAD and Previous Myocardial Infarction; Risk                  Factors:Hypertension and Sleep Apnea.  Sonographer:    Jayson Gaskins Referring Phys: [id number] NATALIE M DONATI-GARMON IMPRESSIONS  1. Left ventricular ejection fraction, by estimation, is 45 to 50%. The left ventricle has mildly decreased function. The left ventricle demonstrates global hypokinesis. There is mild left ventricular hypertrophy. Left ventricular diastolic parameters are indeterminate.  2. Right ventricular systolic function is normal. The right ventricular size is normal.  3. Left atrial size was mildly dilated.  4. The mitral valve is grossly normal. Trivial mitral valve regurgitation. No evidence of mitral stenosis.  5. The aortic valve is grossly normal. There is moderate calcification of the aortic valve. There is mild thickening of the aortic valve. Aortic valve regurgitation is mild. Aortic valve sclerosis/calcification is present, without any evidence of aortic  stenosis.  6. The inferior vena cava is normal in size with <50% respiratory variability, suggesting right atrial pressure of 8 mmHg. Comparison(s): Prior images unable to be directly viewed, comparison made by report only. Most recent echo 01/19/24 from [facility] reviewed: EF 40-45%, global hypokinesis. FINDINGS  Left Ventricle: Left ventricular ejection fraction, by estimation, is 45 to 50%. The left ventricle has mildly decreased function. The left ventricle demonstrates global hypokinesis. The left ventricular internal cavity size was normal in size. There is  mild left ventricular hypertrophy. Abnormal (paradoxical) septal motion consistent with post-operative status. Left ventricular diastolic parameters are indeterminate. Right Ventricle: The right ventricular size is normal. No increase in right ventricular wall thickness. Right ventricular systolic function is normal. Left Atrium: Left atrial size was mildly dilated. Right Atrium: Right atrial size was normal in size. Pericardium: There is no evidence of pericardial effusion.  Mitral Valve: The mitral valve is grossly normal. Trivial mitral valve regurgitation. No evidence of mitral valve stenosis. Tricuspid Valve: The tricuspid valve is normal in structure. Tricuspid valve regurgitation is mild . No evidence of tricuspid stenosis. Aortic Valve: The aortic valve is grossly normal. There is moderate calcification of the aortic valve. There is mild thickening of the aortic valve. Aortic valve regurgitation is mild. Aortic valve sclerosis/calcification is present, without any evidence  of aortic stenosis. Aortic valve mean gradient measures 3.0 mmHg. Aortic valve peak gradient measures 5.7 mmHg. Aortic valve area,  by VTI measures 3.02 cm. Pulmonic Valve: The pulmonic valve was grossly normal. Pulmonic valve regurgitation is mild. No evidence of pulmonic stenosis. Aorta: The aortic root is normal in size and structure. Venous: The inferior vena cava is normal in size with less than 50% respiratory variability, suggesting right atrial pressure of 8 mmHg. IAS/Shunts: No atrial level shunt detected by color flow Doppler.  LEFT VENTRICLE PLAX 2D LVIDd:         4.05 cm   Diastology LVIDs:         3.40 cm   LV e' medial:    5.33 cm/s LV PW:         1.20 cm   LV E/e' medial:  12.7 LV IVS:        1.20 cm   LV e' lateral:   13.40 cm/s LVOT diam:     1.96 cm   LV E/e' lateral: 5.0 LV SV:         71 LV SV Index:   41 LVOT Area:     3.02 cm  RIGHT VENTRICLE TAPSE (M-mode): 2.0 cm LEFT ATRIUM           Index        RIGHT ATRIUM           Index LA Vol (A4C): 52.3 ml 30.10 ml/m  RA Area:     13.40 cm                                    RA Volume:   29.00 ml  16.69 ml/m  AORTIC VALVE AV Area (Vmax):    2.66 cm AV Area (Vmean):   2.54 cm AV Area (VTI):     3.02 cm AV Vmax:           119.00 cm/s AV Vmean:          89.400 cm/s AV VTI:            0.235 m AV Peak Grad:      5.7 mmHg AV Mean Grad:      3.0 mmHg LVOT Vmax:         105.00 cm/s LVOT Vmean:        75.200 cm/s LVOT VTI:          0.235 m LVOT/AV  VTI ratio: 1.00  AORTA Ao Root diam: 2.62 cm MITRAL VALVE MV Area (PHT): 2.87 cm    SHUNTS MV Decel Time: 264 msec    Systemic VTI:  0.24 m MV E velocity: 67.60 cm/s  Systemic Diam: 1.96 cm MV A velocity: 57.60 cm/s MV E/A ratio:  1.17 Shelda Bruckner MD Electronically signed by Shelda Bruckner MD Signature Date/Time: 09/09/2024/1:16:31 PM    Final    [name] Result Date: 09/06/2024 CLINICAL DATA:  Follow-up examination for C7 fracture. EXAM: MRI CERVICAL SPINE WITHOUT CONTRAST TECHNIQUE: Multiplanar, multisequence MR imaging of the cervical spine was performed. No intravenous contrast was administered. COMPARISON:  Prior CT from earlier the same day. FINDINGS: Alignment: Exaggeration of the normal cervical lordosis. No significant listhesis. Vertebrae: Compression fracture involving the C7 vertebral body with up to 75% height loss without bony retropulsion. This is largely chronic in appearance without significant residual marrow edema. Otherwise, vertebral body height maintained with no other acute or chronic fracture. Bone marrow signal intensity within normal limits. No worrisome osseous lesions. Reactive marrow edema present about the left  C4-5 facet due to facet arthritis. Cord: Normal signal morphology. Posterior Fossa, vertebral arteries, paraspinal tissues: Patchy signal abnormality within the pons, consistent with chronic small vessel ischemic disease. Probable small benign arachnoid cyst noted at the supra cerebellar cistern. Craniocervical junction within normal limits. Paraspinous soft tissues within normal limits. Normal flow voids seen within the vertebral arteries bilaterally. Disc levels: C2-C3: Degenerative intervertebral disc space narrowing with diffuse disc bulge and endplate spurring. Moderate left facet arthrosis. No significant spinal stenosis. Foramina remain patent. C3-C4: Degenerative intervertebral disc space narrowing with diffuse disc osteophyte  complex. Left greater than right facet degeneration with bony ankylosis on the left. No more than mild spinal stenosis. Moderate left C4 foraminal narrowing. Right neural foramen remains patent. C4-C5: Degenerative disc space narrowing with diffuse disc osteophyte complex, asymmetric to the right. Posterior component flattens and partially faces the ventral thecal sac. Moderate left facet arthrosis with reactive marrow edema. Resultant mild-to-moderate spinal stenosis. Severe right C5 foraminal narrowing. Left neural foramina remains patent. C5-C6: Advanced degenerative vertebral disc space narrowing with diffuse disc osteophyte complex, asymmetric to the right. Flattening and partial effacement of the ventral thecal sac. Resultant mild-to-moderate spinal stenosis. Severe right with moderate left C6 foraminal narrowing. C6-C7: Minimal disc bulge. Mild facet degeneration on the right. No spinal stenosis. Foramina remain patent. C7-T1: Negative interspace. Mild right-sided facet hypertrophy. No stenosis. IMPRESSION: 1. Chronic compression fracture involving the C7 vertebral body with up to 75% height loss without bony retropulsion. No acute fracture within the cervical spine. 2. Multilevel cervical spondylosis with resultant mild to moderate spinal stenosis at C4-5 and C5-6. 3. Multifactorial degenerative changes with resultant multilevel foraminal narrowing as above. Notable findings include moderate left C4 foraminal stenosis, severe right C5 foraminal narrowing, with severe right and moderate left C6 foraminal stenosis. 4. Reactive marrow edema about the left C4-5 facet due to facet arthritis. Finding could serve as a source for neck pain. Electronically Signed   By: Morene Hoard M.D.   On: 09/06/2024 19:03   DG Shoulder Right Result Date: 09/06/2024 CLINICAL DATA:  Trauma EXAM: RIGHT SHOULDER - 2+ VIEW COMPARISON:  None Available. FINDINGS: There is no evidence of fracture or dislocation. There is no  evidence of arthropathy or other focal bone abnormality. Soft tissues are unremarkable. IMPRESSION: Negative. Electronically Signed   By: Greig Pique M.D.   On: 09/06/2024 16:02   DG FEMUR PORT, 1V RIGHT Result Date: 09/06/2024 CLINICAL DATA:  Trauma EXAM: RIGHT FEMUR PORTABLE 1 VIEW COMPARISON:  Right femur x-ray 03/24/2024. FINDINGS: Right femoral intramedullary nail with hip screws again seen. There has been interval healing and callus formation of the proximal right femoral fracture. No definite acute fracture or dislocation. No hardware loosening. Peripheral vascular calcifications are present. IMPRESSION: 1. No acute fracture or dislocation. 2. Interval healing and callus formation of the proximal right femoral fracture. Electronically Signed   By: Greig Pique M.D.   On: 09/06/2024 15:56   DG Knee Right Port Result Date: 09/06/2024 CLINICAL DATA:  Clemens today at the grocery store walking to his car. Right knee pain. EXAM: PORTABLE RIGHT KNEE - 1-2 VIEW COMPARISON:  03/24/2024. FINDINGS: No fracture or bone lesion. Knee joint normally spaced and aligned. Distal aspect of an intramedullary rod is well-seated and positioned and unchanged. No joint effusion. Significant arterial vascular calcifications noted posteriorly. IMPRESSION: 1. No fracture or acute finding.  No joint abnormality. Electronically Signed   By: Alm Parkins M.D.   On: 09/06/2024 15:52   DG  Elbow Complete Right Result Date: 09/06/2024 CLINICAL DATA:  Fall onto right side EXAM: RIGHT ELBOW - COMPLETE 4 VIEW COMPARISON:  None Available. FINDINGS: There is no evidence of fracture, dislocation, or joint effusion. There is no evidence of arthropathy or other focal bone abnormality. Soft tissues are unremarkable. IMPRESSION: No acute fracture or dislocation. Electronically Signed   By: Limin  Xu M.D.   On: 09/06/2024 15:52   DG Hand Complete Right Result Date: 09/06/2024 CLINICAL DATA:  Status post fall onto right side EXAM: RIGHT  HAND - COMPLETE 3 VIEW COMPARISON:  None Available. FINDINGS: There is no evidence of fracture or dislocation. Mild interphalangeal joint space narrowing. Positive ulnar variance. Soft tissues are unremarkable. IMPRESSION: 1. No acute fracture or dislocation. 2. Mild degenerative changes of the hand. Electronically Signed   By: Limin  Xu M.D.   On: 09/06/2024 15:51   DG Pelvis Portable Result Date: 09/06/2024 CLINICAL DATA:  Traumarotation differential diagnosis EXAM: PORTABLE PELVIS 1-2 VIEWS COMPARISON:  03/23/2024 FINDINGS: Bilateral femoral intramedullary nails are partially visualized.Cortical irregularity involving the parasymphyseal pubic bone extending into the inferior pubic ramus. Healing fracture of the right femoral neck/proximal femur.No dislocation.Moderate joint space loss of both hips. Multilevel degenerative disc disease of the spine. Peripheral vascular atherosclerosis. IMPRESSION: Longitudinally oriented fracture of the right parasymphyseal pubic bone extending into the inferior pubic ramus. Electronically Signed   By: Rogelia Myers M.D.   On: 09/06/2024 14:43   CT HEAD WO CONTRAST Result Date: 09/06/2024 CLINICAL DATA:  Fall around 1 hour ago. Patient recovering from hip surgery. Patient is on Plavix . EXAM: CT HEAD WITHOUT CONTRAST CT CERVICAL SPINE WITHOUT CONTRAST TECHNIQUE: Multidetector CT imaging of the head and cervical spine was performed following the standard protocol without intravenous contrast. Multiplanar CT image reconstructions of the cervical spine were also generated. RADIATION DOSE REDUCTION: This exam was performed according to the departmental dose-optimization program which includes automated exposure control, adjustment of the mA and/or kV according to patient size and/or use of iterative reconstruction technique. COMPARISON:  02/18/2024. FINDINGS: CT HEAD FINDINGS Brain: No evidence of acute infarction, hemorrhage, hydrocephalus, extra-axial collection or mass  lesion/mass effect. There is age-appropriate volume loss and mild periventricular white matter hypoattenuation consistent with chronic microvascular ischemic change. Vascular: No hyperdense vessel or unexpected calcification. Skull: Normal. Negative for fracture or focal lesion. Sinuses/Orbits: Globes and orbits are unremarkable. Sinuses are essentially clear. Other: None. CT CERVICAL SPINE FINDINGS Alignment: Normal. Skull base and vertebrae: Moderate wedge-shaped compression fracture of C7. This appears chronic, and was present on the exam from 02/18/2024, but has increased in severity. No other evidence of a fracture. No bone lesion. Soft tissues and spinal canal: No prevertebral fluid or swelling. No visible canal hematoma. Disc levels: Mild to moderate loss of disc height at C2-C3. Moderate loss of disc height at C3-C4. Marked loss of disc height at C4-C5 and C5-C6. Mild loss of disc height at C7-T1. Endplate spurring minor disc bulging. No convincing disc herniation. Degenerative changes are stable from prior exam. Upper chest: No acute findings. Other: None. IMPRESSION: HEAD CT 1. No acute intracranial abnormalities and no change from prior study. CERVICAL CT 1. No convincing acute fracture. 2. Moderate wedge-shaped compression fracture of C7, present on the prior cervical CT, but increased in overall severity. There is no adjacent edema to suggest an acute component. Electronically Signed   By: Alm Parkins M.D.   On: 09/06/2024 14:36   CT CERVICAL SPINE WO CONTRAST Result Date: 09/06/2024 CLINICAL DATA:  Fall around 1 hour ago. Patient recovering from hip surgery. Patient is on Plavix . EXAM: CT HEAD WITHOUT CONTRAST CT CERVICAL SPINE WITHOUT CONTRAST TECHNIQUE: Multidetector CT imaging of the head and cervical spine was performed following the standard protocol without intravenous contrast. Multiplanar CT image reconstructions of the cervical spine were also generated. RADIATION DOSE REDUCTION: This  exam was performed according to the departmental dose-optimization program which includes automated exposure control, adjustment of the mA and/or kV according to patient size and/or use of iterative reconstruction technique. COMPARISON:  02/18/2024. FINDINGS: CT HEAD FINDINGS Brain: No evidence of acute infarction, hemorrhage, hydrocephalus, extra-axial collection or mass lesion/mass effect. There is age-appropriate volume loss and mild periventricular white matter hypoattenuation consistent with chronic microvascular ischemic change. Vascular: No hyperdense vessel or unexpected calcification. Skull: Normal. Negative for fracture or focal lesion. Sinuses/Orbits: Globes and orbits are unremarkable. Sinuses are essentially clear. Other: None. CT CERVICAL SPINE FINDINGS Alignment: Normal. Skull base and vertebrae: Moderate wedge-shaped compression fracture of C7. This appears chronic, and was present on the exam from 02/18/2024, but has increased in severity. No other evidence of a fracture. No bone lesion. Soft tissues and spinal canal: No prevertebral fluid or swelling. No visible canal hematoma. Disc levels: Mild to moderate loss of disc height at C2-C3. Moderate loss of disc height at C3-C4. Marked loss of disc height at C4-C5 and C5-C6. Mild loss of disc height at C7-T1. Endplate spurring minor disc bulging. No convincing disc herniation. Degenerative changes are stable from prior exam. Upper chest: No acute findings. Other: None. IMPRESSION: HEAD CT 1. No acute intracranial abnormalities and no change from prior study. CERVICAL CT 1. No convincing acute fracture. 2. Moderate wedge-shaped compression fracture of C7, present on the prior cervical CT, but increased in overall severity. There is no adjacent edema to suggest an acute component. Electronically Signed   By: Alm Parkins M.D.   On: 09/06/2024 14:36   DG Chest Port 1 View Result Date: 09/06/2024 CLINICAL DATA:  Trauma EXAM: PORTABLE CHEST - 1 VIEW  COMPARISON:  March 23, 2024 FINDINGS: Low lung volumes with bronchovascular crowding. Patchy airspace opacities in the left lung base with blunting of the costophrenic sulcus. Mild cardiomegaly. Sternotomy wires and CABG markers. Tortuous aorta with aortic atherosclerosis. Diffuse osteopenia. Multilevel thoracic osteophytosis. IMPRESSION: Low lung volumes. Blunting of the left costophrenic sulcus with patchy retrocardiac airspace opacities, possibly representing atelectasis or aspiration with trace left pleural effusion. Electronically Signed   By: Rogelia Myers M.D.   On: 09/06/2024 14:19    Microbiology: Results for orders placed or performed during the hospital encounter of 09/06/24  Surgical PCR screen     Status: None   Collection Time: 09/06/24 11:22 PM   Specimen: Nasal Mucosa; Nasal Swab  Result Value Ref Range Status   MRSA, PCR NEGATIVE NEGATIVE Final   Staphylococcus aureus NEGATIVE NEGATIVE Final    Comment: (NOTE) The Xpert SA Assay (FDA approved for NASAL specimens in patients 71 years of age and older), is one component of a comprehensive surveillance program. It is not intended to diagnose infection nor to guide or monitor treatment. Performed at [facility], [address], [place], [place] [address]     Labs: CBC: Recent Labs  Lab 09/06/24 1530 09/07/24 0138 09/08/24 0251 09/09/24 1041 09/10/24 1715 09/11/24 0534  WBC 8.0 5.0 6.3 5.6 7.2 6.7  NEUTROABS 6.1  --   --  3.4 5.2 4.5  HGB 10.9* 9.6* 9.3* 9.5* 10.0* 9.6*  HCT  32.4* 28.2* 27.6* 27.9* 29.3* 28.3*  MCV 103.5* 102.5* 103.4* 104.5* 103.2* 102.2*  PLT 154 130* 109* 122* 135* 140*   Basic Metabolic Panel: Recent Labs  Lab 09/07/24 0138 09/08/24 0251 09/09/24 1041 09/10/24 1715 09/11/24 0534  NA 134* 134* 136 134* 136  K 3.4* 3.7 4.3 4.1 4.1  CL 103 104 103 99 101  CO2 24 24 28 28 28   GLUCOSE 187* 128* 120* 161* 138*  BUN 24* 33* 33* 43* 46*  CREATININE 1.11 1.32* 1.15 1.42* 1.19   CALCIUM  7.8* 8.1* 8.2* 8.1* 8.3*  MG  --  1.8 1.9 2.0 2.0  PHOS  --   --  3.5 4.9* 4.5   Liver Function Tests: Recent Labs  Lab 09/07/24 0138 09/08/24 0251 09/09/24 1041 09/10/24 1715 09/11/24 0534  AST 23 24 30 25 19   ALT 16 16 23 23 21   ALKPHOS 120 123 119 128* 113  BILITOT 0.9 1.2 1.0 1.0 1.3*  PROT 5.7* 5.3* 5.9* 6.5 6.3*  ALBUMIN  2.9* 2.8* 2.8* 3.0* 2.8*    Discharge time spent: 35 minutes.  Signed: Elgin Lam, MD [facility] 09/12/2024

## 2024-09-12 NOTE — TOC Transition Note (Signed)
 Transition of Care [facility]) - Discharge Note   Patient Details  Name: Matthew Liu MRN: [id number] Date of Birth: 09-06-1941  Transition of Care [facility]) CM/SW Contact:  Bridget Cordella Simmonds, LCSW Phone Number: 09/12/2024, 11:55 AM   Clinical Narrative:   Pt discharging to [facility], room 712.  RN call report to [phone].  Pt daughter [name] will transport.  Pt will need to be brought down to main north tower entrance with assistance getting into the vehicle.    0845: CSW confirmed with [facility] that they can receive pt today.  CSW spoke with pt daughter [name] regarding transportation.  She can transport, will plan to be at the hospital around noon.    Final next level of care: Skilled Nursing Facility Barriers to Discharge: Barriers Resolved   Patient Goals and CMS Choice Patient states their goals for this hospitalization and ongoing recovery are:: to get better and go home CMS Medicare.gov Compare Post Acute Care list provided to:: Patient        Discharge Placement              Patient chooses bed at: [facility], [place] Patient to be transferred to facility by: daughter [name] Name of family member notified: daughter [name] Patient and family notified of of transfer: 09/12/24  Discharge Plan and Services Additional resources added to the After Visit Summary for     Discharge Planning Services: CM Consult                                 Social Drivers of Health (SDOH) Interventions SDOH Screenings   Food Insecurity: No Food Insecurity (09/06/2024)  Housing: Low Risk  (09/06/2024)  Transportation Needs: No Transportation Needs (09/06/2024)  Utilities: Not At Risk (09/06/2024)  Social Connections: Moderately Integrated (09/06/2024)  Tobacco Use: Low Risk  (03/24/2024)     Readmission Risk Interventions     No data to display

## 2024-09-12 NOTE — Progress Notes (Signed)
 Progress Note  Patient Name: Matthew Liu Date of Encounter: 09/12/2024  [facility] Cardiologist: Matthew Bihari, MD   Patient Profile     Subjective   Remains in normal sinus on telemetry.  He has had no reoccurrence of atrial fibrillation.  Heart rate stable in the 70s after starting low-dose Amio 200 mg daily  Inpatient Medications    Scheduled Meds:  amiodarone   200 mg Oral Daily   atorvastatin   80 mg Oral QPM   clopidogrel   75 mg Oral Daily   feeding supplement  237 mL Oral BID BM   furosemide   40 mg Oral Daily   heparin   5,000 Units Subcutaneous Q12H   metoprolol  succinate  50 mg Oral Daily   pantoprazole   40 mg Oral Daily   sacubitril -valsartan   1 tablet Oral BID   sodium chloride  flush  3 mL Intravenous Q12H   Continuous Infusions:  PRN Meds: acetaminophen  **OR** acetaminophen , hydrALAZINE , ondansetron  (ZOFRAN ) IV, oxyCODONE    Vital Signs    Vitals:   09/11/24 1946 09/12/24 0323 09/12/24 0500 09/12/24 0757  BP: 97/75 (!) 125/54  (!) 107/46  Pulse: 89 80    Resp:    18  Temp: 98.7 F (37.1 C) 98.4 F (36.9 C)  98.4 F (36.9 C)  TempSrc:      SpO2: 98% 96%  98%  Weight:   64.8 kg   Height:        Intake/Output Summary (Last 24 hours) at 09/12/2024 0912 Last data filed at 09/12/2024 9145 Gross per 24 hour  Intake 240 ml  Output 1475 ml  Net -1235 ml      09/12/2024    5:00 AM 09/10/2024    4:00 AM 09/07/2024    5:00 AM  Last 3 Weights  Weight (lbs) 142 lb 13.7 oz 147 lb 7.8 oz 139 lb 15.9 oz  Weight (kg) 64.8 kg 66.9 kg 63.5 kg      Telemetry    Normal sinus rhythm- Personally Reviewed  ECG    No new EKG to review- Personally Reviewed  Physical Exam   GEN: Well nourished, well developed in no acute distress HEENT: Normal NECK: No JVD; No carotid bruits LYMPHATICS: No lymphadenopathy CARDIAC:RRR, no murmurs, rubs, gallops RESPIRATORY:  Clear to auscultation without rales, wheezing or rhonchi  ABDOMEN: Soft, non-tender,  non-distended MUSCULOSKELETAL:  No edema; No deformity  SKIN: Warm and dry NEUROLOGIC:  Alert and oriented x 3 PSYCHIATRIC:  Normal affect  Labs    High Sensitivity Troponin:   Recent Labs  Lab 09/08/24 1947  TROPONINIHS 13      Chemistry Recent Labs  Lab 09/09/24 1041 09/10/24 1715 09/11/24 0534  NA 136 134* 136  K 4.3 4.1 4.1  CL 103 99 101  CO2 28 28 28   GLUCOSE 120* 161* 138*  BUN 33* 43* 46*  CREATININE 1.15 1.42* 1.19  CALCIUM  8.2* 8.1* 8.3*  PROT 5.9* 6.5 6.3*  ALBUMIN  2.8* 3.0* 2.8*  AST 30 25 19   ALT 23 23 21   ALKPHOS 119 128* 113  BILITOT 1.0 1.0 1.3*  GFRNONAA >60 49* >60  ANIONGAP 5 7 7      Hematology Recent Labs  Lab 09/09/24 1041 09/10/24 1715 09/11/24 0534  WBC 5.6 7.2 6.7  RBC 2.67* 2.84* 2.77*  2.73*  HGB 9.5* 10.0* 9.6*  HCT 27.9* 29.3* 28.3*  MCV 104.5* 103.2* 102.2*  MCH 35.6* 35.2* 34.7*  MCHC 34.1 34.1 33.9  RDW 14.4 14.2 14.1  PLT 122* 135*  140*    BNP Recent Labs  Lab 09/10/24 0353  BNP 273.5*     DDimer No results for input(s): DDIMER in the last 168 hours.   CHA2DS2-VASc Score = 7   This indicates a 11.2% annual risk of stroke. The patient's score is based upon: CHF History: 1 HTN History: 1 Diabetes History: 0 Stroke History: 2 Vascular Disease History: 1 Age Score: 2 Gender Score: 0      Radiology    No results found.   Patient Profile     83 y.o. male  with a hx of Barrett's esophagus, prior CVA, osteoporosis, OSA on CPAP, pulmonary fibrosis, Parkinson's, HFmrEF, and CAD s/p DES and CABG who is being seen 09/09/2024 for the evaluation of A-fib with RVR at the request of Matthew Liu.   Assessment & Plan    New onset atrial fibrillation with RVR -In review of prior EKGs it appeared that he was having some SVT during his hospitalization in June for a mechanical fall with hip fracture.  At that time it looked like SVT but cannot rule out short bursts of atrial flutter. - Noted to go into atrial  fibrillation with RVR yesterday but is now back in sinus rhythm on telemetry - CHA2DS2-VASc score is 7 which is very high - Unfortunately I think he is a very poor candidate for chronic anticoagulation given multiple falls over the past few months with significant resultant injuries with each of these falls.  He lives at home with his wife who has dementia and he has Parkinson's which also puts him at higher risk of falls - Suspect A-fib occurred due to stress of his acute injuries from his fall - Discussed with EP  and given his high has bled score he is at significant risk for bleeding complications on DOAC therapy.  In order to get a watchman he would have to be able to be on DOAC for at least 4 to 5 months and given his multiple falls with serious injuries over the past 6 months the risks of any anticoagulation outweigh the benefits. - Liu not recommend starting anticoagulation therapy based on risks as stated above.  This was discussed with his daughter - Started on low-dose amiodarone 200 mg daily.  I am concerned though that he does have a history of interstitial lung disease >>discussed with his Pulmonologist and he is ok with patient being on low dose Amio at 200mg  daily  Chronic HFmrEF Ischemic Cardiomyopathy - 2D echo showed EF 45 to 50% with global HK and mild AI - This is similar to EF earlier in the year when he presented to [facility] ER with shortness of breath.  I ordered a PET stress CT when I saw him in April 2025 but unfortunately this never got pursued because patient then had a mechanical fall and fractured a hip and has been in and out of the hospital with falls since then - Chest x-ray showed low lung volumes with possible aspiration versus atelectasis but no edema - BNP minimally elevated at 273 - Appears euvolemic on exam today - I's and O's -1.5 L yesterday and net -4.5 L overall - SCr 1.19; K+ 4.1; mag 2.0 - Continue Toprol  XL 50 mg daily - Continue Lasix  40 mg daily -  Continue Entresto 24-26 mg twice daily - Consider addition of spironolactone and SGLT2i >> this can be done outpatient>> BP too soft for initiation at this time   ASCAD HLD - s/p inferior STEMI with  DES x 2 to the ostial to mid RCA.   - Repeat cath in 2020 revealed severe 2 vessel CAD with 99% ostial LAD, 90% distal left main, 99% ostial left circumflex, 95% mid left circumflex, 60% proximal D1, 90% mid LAD, 60% ostial RCA and 25% ISR of the proximal to mid RCA.   -S/P CABG x 5 with LIMA to the LAD, SVG to diagonal, sequential SVG to OM 2/OM 3, SVG to RCA in 2020 - Still unclear etiology of decline in EF over the past year  - Once he has recovered from his fall and orthopedic issues we will consider following through with outpatient stress PET CT to rule out ischemia - He has not had any chest pain or shortness of breath - Continue Plavix  75 mg daily, atorvastatin  80 mg daily Toprol -XL 50 mg daily  - Lipids today: LDL 44, HDL 48, triglycerides 37   Hypertension - BP stable at 107/46 mmHg but was in the 90s systolic yesterday - Continue Toprol  XL 50 mg daily and Entresto  24-26 mg twice daily  [facility] will sign off.   Medication Recommendations: Amiodarone  200 mg daily, atorvastatin  80 mg daily, Plavix  75 mg daily, Lasix  40 mg daily, Toprol -XL 50 mg daily and Entresto  24-26 mg twice daily Other recommendations (labs, testing, etc): None Follow up as an outpatient: 2 Weeks with extender in the office  I spent 30 minutes caring for this patient today face to face, ordering and reviewing labs, reviewing records from 2D echo this admission , seeing the patient, documenting in the record  For questions or updates, please contact [facility] Please consult [email or web address] for contact info under        Signed, Matthew Bihari, MD  09/12/2024, 9:12 AM

## 2024-09-12 NOTE — Plan of Care (Signed)
  Problem: Education: Goal: Knowledge of General Education information will improve Description: Including pain rating scale, medication(s)/side effects and non-pharmacologic comfort measures Outcome: Progressing   Problem: Health Behavior/Discharge Planning: Goal: Ability to manage health-related needs will improve Outcome: Progressing   Problem: Clinical Measurements: Goal: Will remain free from infection Outcome: Progressing   Problem: Pain Managment: Goal: General experience of comfort will improve and/or be controlled Outcome: Progressing

## 2024-09-12 NOTE — Discharge Instructions (Addendum)
 Matthew Liu,  You were in the hospital after a fall. You have a pelvic fracture which does not require surgery. You were also found to have atrial fibrillation, which has resolved. The cardiologist has changed your medications; please take them as prescribed.

## 2024-09-18 ENCOUNTER — Ambulatory Visit

## 2024-09-18 ENCOUNTER — Telehealth: Payer: Self-pay

## 2024-09-18 VITALS — BP 110/68 | HR 76 | Ht 67.0 in | Wt 134.6 lb

## 2024-09-18 DIAGNOSIS — I480 Paroxysmal atrial fibrillation: Secondary | ICD-10-CM | POA: Diagnosis not present

## 2024-09-18 DIAGNOSIS — G4733 Obstructive sleep apnea (adult) (pediatric): Secondary | ICD-10-CM

## 2024-09-18 DIAGNOSIS — E785 Hyperlipidemia, unspecified: Secondary | ICD-10-CM | POA: Diagnosis not present

## 2024-09-18 DIAGNOSIS — I739 Peripheral vascular disease, unspecified: Secondary | ICD-10-CM | POA: Diagnosis not present

## 2024-09-18 DIAGNOSIS — I10 Essential (primary) hypertension: Secondary | ICD-10-CM | POA: Diagnosis not present

## 2024-09-18 DIAGNOSIS — Z951 Presence of aortocoronary bypass graft: Secondary | ICD-10-CM | POA: Diagnosis not present

## 2024-09-18 DIAGNOSIS — I2510 Atherosclerotic heart disease of native coronary artery without angina pectoris: Secondary | ICD-10-CM

## 2024-09-18 MED ORDER — SPIRONOLACTONE 25 MG PO TABS: 12.5000 mg | ORAL_TABLET | Freq: Every day | ORAL | 3 refills | Status: DC

## 2024-09-18 MED ORDER — EMPAGLIFLOZIN 10 MG PO TABS: 10.0000 mg | ORAL_TABLET | Freq: Every day | ORAL | 3 refills | Status: DC

## 2024-09-18 NOTE — Telephone Encounter (Signed)
 Pt needs a 21m fu per [place] with [facility]. Pt preference: mornings

## 2024-09-18 NOTE — Patient Instructions (Signed)
 Medication Instructions:  Start Jardiance 10 mg daily Spironolactone 12.5mg  daily *If you need a refill on your cardiac medications before your next appointment, please call your pharmacy*  Lab Work: In 2 weeks BMP and Magnesium  If you have labs (blood work) drawn today and your tests are completely normal, you will receive your results only by: MyChart Message (if you have MyChart) OR A paper copy in the mail If you have any lab test that is abnormal or we need to change your treatment, we will call you to review the results.  Testing/Procedures: None   Follow-Up: At [facility], you and your health needs are our priority.  As part of our continuing mission to provide you with exceptional heart care, our providers are all part of one team.  This team includes your primary Cardiologist (physician) and Advanced Practice Providers or APPs (Physician Assistants and Nurse Practitioners) who all work together to provide you with the care you need, when you need it.  Your next appointment:   3 month(s)  Provider:   Wilbert Bihari, MD    We recommend signing up for the patient portal called MyChart.  Sign up information is provided on this After Visit Summary.  MyChart is used to connect with patients for Virtual Visits (Telemedicine).  Patients are able to view lab/test results, encounter notes, upcoming appointments, etc.  Non-urgent messages can be sent to your provider as well.   To learn more about what you can do with MyChart, go to [email or web address].   Other Instructions None

## 2024-09-18 NOTE — Progress Notes (Signed)
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 male who presents for follow up.  He has been doing well since leaving the hospital.  He denies any chest palpitation, dyspnea, or lower extremity edema.  He has been adherent to his Lasix  and reports significant urine output, but denies feeling dehydrated or orthostasis.  He denies any significant back pain given recent compression fracture.   ROS: Please see the history of present illness. All other systems are reviewed and negative.   PHYSICAL EXAM: VS:  BP 110/68   Pulse 76   Ht 5' 7 (1.702 m)   Wt 134 lb 9.6 oz (61.1 kg)   SpO2 90%   BMI 21.08 kg/m  , BMI Body mass index is 21.08 kg/m. GEN: Well nourished, well developed, in no acute distress HEENT: normal Neck: no JVD, carotid bruits, or masses Cardiac: RRR; no murmurs, rubs, or gallops,no edema  Respiratory:  CTAB bilaterally, normal work of breathing GI: soft, nontender, nondistended, + BS Extremities: No LE edema Skin: warm and dry, no rash Neuro:  Strength and sensation are intact  EKG:  NSR, PACs, LVH  Recent Labs: Reviewed  Studies: Reviewed  ASSESSMENT AND PLAN: Matthew Liu is a 83 y.o. male who presents for follow up. - In terms of his cardiomyopathy, he denies any symptoms of heart failure and appears euvolemic on exam.  We will continue with Lasix , metoprolol , and Entresto .  We will start empagliflozin 10 and spironolactone 12.5.  Will obtain BMP and magnesium  in about 2 weeks. - Defer ischemic eval to his primary cardiologist Dr. Shlomo, who he will be seeing in about 3 months. - In terms of his A-fib, he seems to be in normal sinus rhythm based on EKG.  He is not currently therapeutic anticoagulated given significant risk of bleeding.  They have been discussion regarding Watchman while admitted and it sounds like there was concerned that he would not be able to tolerate even short course, and thus is not a candidate for watchman.   Signed, Joelle VEAR Ren Donley, MD  09/18/2024 9:44 AM    [facility]

## 2024-09-19 ENCOUNTER — Ambulatory Visit

## 2024-09-25 ENCOUNTER — Telehealth: Payer: Self-pay

## 2024-09-25 MED ORDER — EMPAGLIFLOZIN 10 MG PO TABS: 10.0000 mg | ORAL_TABLET | Freq: Every day | ORAL | 3 refills | Status: DC

## 2024-09-25 MED ORDER — SPIRONOLACTONE 25 MG PO TABS: 12.5000 mg | ORAL_TABLET | Freq: Every day | ORAL | 3 refills | Status: DC

## 2024-09-25 NOTE — Telephone Encounter (Signed)
 I was able to call the pharmacy, put the order in for the medications and send it to the correct pharmacy.

## 2024-09-25 NOTE — Telephone Encounter (Signed)
 Pt c/o medication issue:  1. Name of Medication: empagliflozin  (JARDIANCE ) 10 MG TABS tablet  spironolactone  (ALDACTONE ) 25 MG tablet  2. How are you currently taking this medication (dosage and times per day)?   3. Are you having a reaction (difficulty breathing--STAT)? NO   4. What is your medication issue? Pharmacy calling to state they were handling pts Rxs when he was in a facility, but they have no record of him being in one currently. Rxs will need to be sent to another pharmacy. Please advise.   [phone] [place] with pharmacy can be contacted with any questions.

## 2024-10-02 ENCOUNTER — Telehealth: Payer: Self-pay | Admitting: Cardiology

## 2024-10-02 NOTE — Telephone Encounter (Signed)
 Pts insurance, [employer], asking if Dr. Shlomo thinks an anticoagulant would be beneficial for the patient due to their afib diagnosis. If so, she ask a prescription be sent to pts pharmacy.  Please advise.    Callback: [phone]

## 2024-10-03 LAB — BASIC METABOLIC PANEL WITH GFR
BUN/Creatinine Ratio: 22 (ref 10–24)
BUN: 28 mg/dL — ABNORMAL HIGH (ref 8–27)
CO2: 24 mmol/L (ref 20–29)
Calcium: 9 mg/dL (ref 8.6–10.2)
Chloride: 100 mmol/L (ref 96–106)
Creatinine, Ser: 1.29 mg/dL — ABNORMAL HIGH (ref 0.76–1.27)
Glucose: 98 mg/dL (ref 70–99)
Potassium: 4 mmol/L (ref 3.5–5.2)
Sodium: 139 mmol/L (ref 134–144)
eGFR: 55 mL/min/1.73 — ABNORMAL LOW

## 2024-10-03 LAB — MAGNESIUM: Magnesium: 2 mg/dL (ref 1.6–2.3)

## 2024-10-04 ENCOUNTER — Ambulatory Visit: Payer: Self-pay

## 2024-10-07 NOTE — Telephone Encounter (Signed)
 Call to [facility], spoke to Tianna. Advised patient is not on anticoagulation due to frequent falls from Parkinsons.

## 2024-10-17 ENCOUNTER — Ambulatory Visit

## 2024-11-12 ENCOUNTER — Other Ambulatory Visit: Payer: Self-pay

## 2024-11-12 ENCOUNTER — Encounter: Payer: Self-pay | Admitting: Cardiology

## 2024-11-13 MED ORDER — EMPAGLIFLOZIN 10 MG PO TABS: 10.0000 mg | ORAL_TABLET | Freq: Every day | ORAL | 1 refills | Status: AC

## 2024-11-13 MED ORDER — SPIRONOLACTONE 25 MG PO TABS: 12.5000 mg | ORAL_TABLET | Freq: Every day | ORAL | 1 refills | Status: AC

## 2024-12-31 ENCOUNTER — Ambulatory Visit: Admitting: Cardiology
